# Patient Record
Sex: Male | Born: 1935 | Race: White | Hispanic: No | Marital: Married | Smoking: Never smoker
Health system: Southern US, Community
[De-identification: ages and names within clinical notes are randomized; demographics above are authoritative.]

## PROBLEM LIST (undated history)

## (undated) DIAGNOSIS — K219 Gastro-esophageal reflux disease without esophagitis: Secondary | ICD-10-CM

## (undated) DIAGNOSIS — E781 Pure hyperglyceridemia: Secondary | ICD-10-CM

## (undated) DIAGNOSIS — R011 Cardiac murmur, unspecified: Secondary | ICD-10-CM

## (undated) DIAGNOSIS — N529 Male erectile dysfunction, unspecified: Secondary | ICD-10-CM

## (undated) DIAGNOSIS — C801 Malignant (primary) neoplasm, unspecified: Secondary | ICD-10-CM

## (undated) DIAGNOSIS — I10 Essential (primary) hypertension: Secondary | ICD-10-CM

## (undated) DIAGNOSIS — D649 Anemia, unspecified: Secondary | ICD-10-CM

## (undated) DIAGNOSIS — N183 Chronic kidney disease, stage 3 unspecified: Secondary | ICD-10-CM

## (undated) DIAGNOSIS — M1990 Unspecified osteoarthritis, unspecified site: Secondary | ICD-10-CM

## (undated) DIAGNOSIS — H9190 Unspecified hearing loss, unspecified ear: Secondary | ICD-10-CM

## (undated) DIAGNOSIS — L719 Rosacea, unspecified: Secondary | ICD-10-CM

## (undated) DIAGNOSIS — R0600 Dyspnea, unspecified: Secondary | ICD-10-CM

## (undated) DIAGNOSIS — N19 Unspecified kidney failure: Secondary | ICD-10-CM

## (undated) HISTORY — DX: Pure hyperglyceridemia: E78.1

## (undated) HISTORY — DX: Gastro-esophageal reflux disease without esophagitis: K21.9

## (undated) HISTORY — DX: Male erectile dysfunction, unspecified: N52.9

## (undated) HISTORY — PX: OTHER SURGICAL HISTORY: SHX169

## (undated) HISTORY — DX: Cardiac murmur, unspecified: R01.1

## (undated) HISTORY — DX: Rosacea, unspecified: L71.9

## (undated) HISTORY — PX: COLONOSCOPY: SHX174

## (undated) HISTORY — DX: Essential (primary) hypertension: I10

## (undated) HISTORY — PX: TONSILLECTOMY: SUR1361

## (undated) HISTORY — DX: Chronic kidney disease, stage 3 (moderate): N18.3

## (undated) HISTORY — DX: Chronic kidney disease, stage 3 unspecified: N18.30

---

## 2011-05-19 ENCOUNTER — Telehealth: Payer: Self-pay | Admitting: Oncology

## 2011-05-19 NOTE — Telephone Encounter (Signed)
Pt returned my call and was given newpt appt w/FS for 1/31 @ 10:30 am.

## 2011-05-19 NOTE — Telephone Encounter (Signed)
lmonvm for pt re calling me for appt w/FS.

## 2011-05-24 ENCOUNTER — Telehealth: Payer: Self-pay | Admitting: Oncology

## 2011-05-24 ENCOUNTER — Other Ambulatory Visit: Payer: Self-pay | Admitting: Oncology

## 2011-05-24 DIAGNOSIS — D729 Disorder of white blood cells, unspecified: Secondary | ICD-10-CM

## 2011-05-24 NOTE — Telephone Encounter (Signed)
Referred by Dr. Donato Heinz  [facility]

## 2011-05-27 ENCOUNTER — Ambulatory Visit (HOSPITAL_BASED_OUTPATIENT_CLINIC_OR_DEPARTMENT_OTHER): Payer: Medicare Other | Admitting: Oncology

## 2011-05-27 ENCOUNTER — Ambulatory Visit (HOSPITAL_BASED_OUTPATIENT_CLINIC_OR_DEPARTMENT_OTHER): Payer: Medicare Other

## 2011-05-27 ENCOUNTER — Telehealth: Payer: Self-pay | Admitting: Oncology

## 2011-05-27 ENCOUNTER — Other Ambulatory Visit (HOSPITAL_BASED_OUTPATIENT_CLINIC_OR_DEPARTMENT_OTHER): Payer: Medicare Other

## 2011-05-27 ENCOUNTER — Ambulatory Visit: Payer: Medicare Other

## 2011-05-27 VITALS — BP 145/65 | HR 68 | Temp 97.7°F | Ht 65.5 in | Wt 150.5 lb

## 2011-05-27 DIAGNOSIS — D7289 Other specified disorders of white blood cells: Secondary | ICD-10-CM

## 2011-05-27 DIAGNOSIS — D729 Disorder of white blood cells, unspecified: Secondary | ICD-10-CM

## 2011-05-27 LAB — CBC WITH DIFFERENTIAL/PLATELET
Eosinophils Absolute: 0.1 10*3/uL (ref 0.0–0.5)
HCT: 37 % — ABNORMAL LOW (ref 38.4–49.9)
HGB: 12.5 g/dL — ABNORMAL LOW (ref 13.0–17.1)
LYMPH%: 25.3 % (ref 14.0–49.0)
MONO#: 0.7 10*3/uL (ref 0.1–0.9)
NEUT#: 4.8 10*3/uL (ref 1.5–6.5)
NEUT%: 63.9 % (ref 39.0–75.0)
Platelets: 224 10*3/uL (ref 140–400)
WBC: 7.4 10*3/uL (ref 4.0–10.3)

## 2011-05-27 NOTE — Telephone Encounter (Signed)
gv pt appt for july-aug2013.  sent pt to labs. scheduled pt for bone survey for 06/03/2011 @ [facility]

## 2011-05-27 NOTE — Progress Notes (Signed)
CC:   Donato Heinz, M.D. Gavin Pound, MD  REFERRING PHYSICIAN:  Donato Heinz, M.D.  REASON FOR CONSULTATION:  Evaluation for plasma cell disorder.  HISTORY OF PRESENT ILLNESS:  Mr. Jeffrey Payne is a pleasant gentleman of Caucasian descent, native of [place] but lived in multiple areas and has retired to [place] for many years now.  He relocated to [facility] back in August of 2012 to live closer to his daughter.  He is a gentleman with a history of a longstanding hypertension and chronic renal sufficiency presumably related to that.  His creatinine have ranged around 1.4 and he was followed by Nephrology while he was living in [place].  However, he developed worsening renal insufficiency. December's creatinine was 1.72 and he was evaluated by a Dr. Marval Regal and his workup including renal artery duplex did not show any evidence of renal artery stenosis.  He had also had a serum protein electrophoresis which showed an M-spike of around 0.4 g/dL.  He had a negative ANCA panel.  His quantitative immunoglobulins showed an elevated IgM of 1130.  He had a suppressed IgG and IgA levels.  He had serum light chains that showed an elevated kappa to lambda ratio of 5.26.  His urine electrophoresis was negative, did not show any monoclonal protein.  His hemoglobin was normal of 13.2, white count of 6.1, normal platelets, normal differential.  His creatinine was about 1.47 with a creatinine clearance of around 46.  Clinically, Mr. Marotte is asymptomatic.  He did not report any back pain.  He does not report any shoulder pain.  He does not report any weight loss.  He has some slight fatigue reported but really no constitutional symptoms.  No lymphadenopathy.  He does not report any thrombotic events.  He has not had any bleeding diathesis.  REVIEW OF SYSTEMS:  He does not report any headaches, blurry vision, double vision. He does not report any motor or sensory neuropathy.  He did not  report any alteration of mental status.  He does not report any psychiatric issues, depression.  He does not report any fever, chills, sweats.  He does not report any cough, hemoptysis, hematemesis.  No nausea, vomiting, abdominal pain.  No hematochezia, melena, or genitourinary complaints.  Rest of review of systems unremarkable.  PAST MEDICAL HISTORY:  Significant for hypertension, osteoarthritis, GERD, history of prostate cancer although that history is questionable, history of rosacea as well as chronic renal insufficiency.  He is status post tonsillectomy, hernia repair and rotator cuff repair.  MEDICATION:  He is on lisinopril, hydrochlorothiazide, terazosin, Toprol, Norvasc, ranitidine, meloxicam, aspirin, Claritin, Flonase, glucosamine and calcium supplement and fish oil.  ALLERGIES:  None.  SOCIAL HISTORY:  He is married, has 3 children.  He denied any alcohol abuse.  He drinks a Manhattan every once in a while.  He worked as an Chief Financial Officer.  FAMILY HISTORY:  His father died in his late 6s.  Mother had breast cancer.  Otherwise no history of any malignant disorders or blood problems.  PHYSICAL EXAMINATION:  General:  Alert, awake gentleman, appeared in no active distress today.  Vital signs:  His blood pressure is 145/65, pulse is 68, respiration 20, he is afebrile at 97.7.  ECOG performance status is 1.  HEENT:  Head is normocephalic, atraumatic.  Pupils equal, round, reactive to light.  Oral mucosa moist and pink.  Neck:  Supple without lymphadenopathy.  Heart:  Regular rate and rhythm.  S1, S2. Lungs:  Clear to auscultation.  Abdomen:  Soft, nontender.  No hepatosplenomegaly.  Extremities:  No clubbing, cyanosis, or edema. Neurological:  Intact motor, sensory and deep tendon reflexes.  LABORATORY DATA:  As discussed in the history of present illness.  My workup is currently pending.  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  He has an elevated IgM quantitative immunoglobulin of 1100, upper limit of normal is 230.  Immunofixation showed an IgM monoclonal protein with kappa light chains specificity.  I had a discussion today with Mr. Leiba and his wife discussing the differential diagnosis at this time.  Differential diagnosis include but not limited to a certain plasma cell disorders that include a monoclonal gammopathy of undetermined significance, multiple myeloma, Waldenstrom's macroglobulinemia can certainly manifest with an IgM elevation.  Also a reactive process can manifest with IgM monoclonal gammopathy is also known and has been described in the literature.  Autoimmune disorders certainly can manifest itself with an IgM monoclonal protein and definitely a lymphoproliferative disorder such as indolent lymphomas are certainly possibilities.  In terms of workup, at this point I believe that his spike is rather small.  He is asymptomatic.  I will repeat the serum protein electrophoresis and a quantitative immunoglobulin just to get an idea of the pace of this disease.  I will also obtain a skeletal survey to rule out lytic lesion although an IgM multiple myeloma is rather rare.  I do not really see any evidence to suggest Waldenstrom's macroglobulinemia.  I certainly do not see any evidence to suggest a lymphoproliferative disorder.  However, if he develops any symptomatology in the future, progressive cytopenias, constitutional symptoms, then I will obtain a bone marrow biopsy and CT scans.  At this time, I think continued observation is reasonable and I will see him back in 6 months' time, repeat CT studies again pending initial workup being negative.  I do not think his renal insufficiency is related to his M-spike.  His urine has really cleared, he did not have any light chains so I think this is an unrelated issue.  However, I  will continue to monitor him and we will work him up as needed.    ______________________________ Wyatt Portela, M.D. FNS/MEDQ  D:  05/27/2011  T:  05/27/2011  Job:  [id number]

## 2011-05-27 NOTE — Progress Notes (Signed)
Note dictated

## 2011-05-31 LAB — SPEP & IFE WITH QIG
Alpha-2-Globulin: 13.3 % — ABNORMAL HIGH (ref 7.1–11.8)
Beta 2: 7 % — ABNORMAL HIGH (ref 3.2–6.5)
Gamma Globulin: 10.7 % — ABNORMAL LOW (ref 11.1–18.8)
IgG (Immunoglobin G), Serum: 374 mg/dL — ABNORMAL LOW (ref 650–1600)
IgM, Serum: 885 mg/dL — ABNORMAL HIGH (ref 41–251)
M-Spike, %: 0.37 g/dL

## 2011-05-31 LAB — COMPREHENSIVE METABOLIC PANEL
ALT: 16 U/L (ref 0–53)
Albumin: 4.4 g/dL (ref 3.5–5.2)
BUN: 37 mg/dL — ABNORMAL HIGH (ref 6–23)
CO2: 25 mEq/L (ref 19–32)
Calcium: 10.2 mg/dL (ref 8.4–10.5)
Chloride: 104 mEq/L (ref 96–112)
Creatinine, Ser: 1.73 mg/dL — ABNORMAL HIGH (ref 0.50–1.35)
Potassium: 4.2 mEq/L (ref 3.5–5.3)

## 2011-05-31 LAB — KAPPA/LAMBDA LIGHT CHAINS: Kappa free light chain: 2.45 mg/dL — ABNORMAL HIGH (ref 0.33–1.94)

## 2011-06-03 ENCOUNTER — Ambulatory Visit (HOSPITAL_COMMUNITY)
Admission: RE | Admit: 2011-06-03 | Discharge: 2011-06-03 | Disposition: A | Discharge status: Home / Self Care | Payer: Medicare Other | Source: Ambulatory Visit | Attending: Oncology | Admitting: Oncology

## 2011-06-03 DIAGNOSIS — M25519 Pain in unspecified shoulder: Secondary | ICD-10-CM | POA: Insufficient documentation

## 2011-06-03 DIAGNOSIS — M47817 Spondylosis without myelopathy or radiculopathy, lumbosacral region: Secondary | ICD-10-CM | POA: Insufficient documentation

## 2011-06-03 DIAGNOSIS — M25539 Pain in unspecified wrist: Secondary | ICD-10-CM | POA: Insufficient documentation

## 2011-06-03 DIAGNOSIS — J8410 Pulmonary fibrosis, unspecified: Secondary | ICD-10-CM | POA: Insufficient documentation

## 2011-06-03 DIAGNOSIS — M129 Arthropathy, unspecified: Secondary | ICD-10-CM | POA: Insufficient documentation

## 2011-06-03 DIAGNOSIS — D729 Disorder of white blood cells, unspecified: Secondary | ICD-10-CM

## 2011-06-03 DIAGNOSIS — M4120 Other idiopathic scoliosis, site unspecified: Secondary | ICD-10-CM | POA: Insufficient documentation

## 2011-06-03 DIAGNOSIS — D7289 Other specified disorders of white blood cells: Secondary | ICD-10-CM | POA: Insufficient documentation

## 2011-07-09 ENCOUNTER — Encounter: Payer: Self-pay | Admitting: Oncology

## 2011-11-23 ENCOUNTER — Other Ambulatory Visit (HOSPITAL_BASED_OUTPATIENT_CLINIC_OR_DEPARTMENT_OTHER): Payer: Medicare Other

## 2011-11-23 DIAGNOSIS — D729 Disorder of white blood cells, unspecified: Secondary | ICD-10-CM

## 2011-11-23 DIAGNOSIS — D7289 Other specified disorders of white blood cells: Secondary | ICD-10-CM

## 2011-11-23 LAB — CBC WITH DIFFERENTIAL/PLATELET
Eosinophils Absolute: 0.1 10*3/uL (ref 0.0–0.5)
HCT: 36.9 % — ABNORMAL LOW (ref 38.4–49.9)
LYMPH%: 27.4 % (ref 14.0–49.0)
MONO#: 0.9 10*3/uL (ref 0.1–0.9)
NEUT#: 3.9 10*3/uL (ref 1.5–6.5)
NEUT%: 58.3 % (ref 39.0–75.0)
Platelets: 204 10*3/uL (ref 140–400)
RBC: 3.91 10*6/uL — ABNORMAL LOW (ref 4.20–5.82)
WBC: 6.8 10*3/uL (ref 4.0–10.3)
lymph#: 1.9 10*3/uL (ref 0.9–3.3)

## 2011-11-25 LAB — COMPREHENSIVE METABOLIC PANEL
ALT: 16 U/L (ref 0–53)
CO2: 24 mEq/L (ref 19–32)
Calcium: 10.2 mg/dL (ref 8.4–10.5)
Chloride: 107 mEq/L (ref 96–112)
Glucose, Bld: 92 mg/dL (ref 70–99)
Sodium: 141 mEq/L (ref 135–145)
Total Bilirubin: 0.6 mg/dL (ref 0.3–1.2)
Total Protein: 6 g/dL (ref 6.0–8.3)

## 2011-11-25 LAB — SPEP & IFE WITH QIG
Alpha-2-Globulin: 12.8 % — ABNORMAL HIGH (ref 7.1–11.8)
Beta 2: 6.3 % (ref 3.2–6.5)
Beta Globulin: 5 % (ref 4.7–7.2)
Gamma Globulin: 10.8 % — ABNORMAL LOW (ref 11.1–18.8)
IgA: 55 mg/dL — ABNORMAL LOW (ref 68–379)
IgG (Immunoglobin G), Serum: 327 mg/dL — ABNORMAL LOW (ref 650–1600)
M-Spike, %: 0.16 g/dL
Total Protein, Serum Electrophoresis: 6 g/dL (ref 6.0–8.3)

## 2011-11-25 LAB — KAPPA/LAMBDA LIGHT CHAINS: Lambda Free Lght Chn: 0.75 mg/dL (ref 0.57–2.63)

## 2011-12-07 ENCOUNTER — Telehealth: Payer: Self-pay | Admitting: Oncology

## 2011-12-07 ENCOUNTER — Ambulatory Visit (HOSPITAL_BASED_OUTPATIENT_CLINIC_OR_DEPARTMENT_OTHER): Payer: Medicare Other | Admitting: Oncology

## 2011-12-07 VITALS — BP 126/68 | HR 65 | Temp 97.6°F | Resp 20 | Ht 65.5 in | Wt 148.8 lb

## 2011-12-07 DIAGNOSIS — D729 Disorder of white blood cells, unspecified: Secondary | ICD-10-CM

## 2011-12-07 DIAGNOSIS — D472 Monoclonal gammopathy: Secondary | ICD-10-CM

## 2011-12-07 NOTE — Progress Notes (Signed)
Hematology and Oncology Follow Up Visit  Jeffrey Payne [id number] May 28, 1932 78 y.o. 12/07/2011 10:11 AM   Principle Diagnosis: 78 year old gentleman with a monoclonal protein, it is an IgM subtype, presented with an M-spike about 0.4 g/dL. He has an elevated IgM quantitative immunoglobulin is around 1100 on 04/2011.  Differential diagnosis include plasma cell disorders like  monoclonal gammopathy of undetermined significance,  (less likely multiple myeloma),  lymphoproliferative disorder like Waldenstrom's macroglobulinemia and reactive process can manifest with IgM monoclonal gammopathy as well.  His Skeletal survey is normal from 05/2011.   Current therapy: Observation and surveillance.   Interim History:  Jeffrey Payne presents today for a follow up visit. Since his last visit he has been doing well with no complaints. He reports mild back pain at this time which has not changed. Clinically, Jeffrey Payne is asymptomatic. He did not report any back pain. He does not report any shoulder pain. He does not report any weight loss. He has some slight fatigue reported but really no constitutional symptoms. No lymphadenopathy. He does not report any thrombotic events. He has not  had any bleeding diathesis.    Medications: I have reviewed the patient's current medications. Current outpatient prescriptions:acetaminophen (TYLENOL) 650 MG CR tablet, Take 650 mg by mouth every 8 (eight) hours as needed., Disp: , Rfl: ;  amLODipine (NORVASC) 10 MG tablet, Take 10 mg by mouth daily., Disp: , Rfl: ;  aspirin 81 MG tablet, Take 81 mg by mouth daily., Disp: , Rfl: ;  calcium gluconate 500 MG tablet, Take 500 mg by mouth daily. Plus vitamin D 2, Disp: , Rfl:  doxycycline (VIBRAMYCIN) 100 MG capsule, Take 100 mg by mouth daily. For rosacea, Disp: , Rfl: ;  fish oil-omega-3 fatty acids 1000 MG capsule, Take 1,000 mg by mouth 3 (three) times daily., Disp: , Rfl: ;  fluticasone (FLONASE) 50 MCG/ACT nasal spray, Place 2  sprays into the nose daily., Disp: , Rfl: ;  furosemide (LASIX) 40 MG tablet, Take 40 mg by mouth daily., Disp: , Rfl:  Glucosamine-Chondroitin (GLUCOSAMINE CHONDR COMPLEX PO), Take 750 mg by mouth 2 (two) times daily., Disp: , Rfl: ;  lisinopril (PRINIVIL,ZESTRIL) 40 MG tablet, Take 40 mg by mouth daily., Disp: , Rfl: ;  loratadine (CLARITIN) 10 MG tablet, Take 10 mg by mouth daily. As needed, Disp: , Rfl: ;  metoprolol succinate (TOPROL-XL) 25 MG 24 hr tablet, Take 25 mg by mouth daily., Disp: , Rfl:  multivitamin (ONE-A-DAY MEN'S) TABS, Take 1 tablet by mouth daily., Disp: , Rfl: ;  ranitidine (ZANTAC) 150 MG tablet, Take 150 mg by mouth 2 (two) times daily., Disp: , Rfl: ;  terazosin (HYTRIN) 10 MG capsule, Take 10 mg by mouth at bedtime., Disp: , Rfl:   Allergies: No Known Allergies  Past Medical History, Surgical history, Social history, and Family History were reviewed and updated.  Review of Systems: Constitutional:  Negative for fever, chills, night sweats, anorexia, weight loss, pain. Cardiovascular: no chest pain or dyspnea on exertion Respiratory: negative Neurological: negative Dermatological: negative ENT: negative Skin: Negative. Gastrointestinal: negative Genito-Urinary: negative Hematological and Lymphatic: negative Breast: negative Musculoskeletal: negative Remaining ROS negative. Physical Exam: Blood pressure 126/68, pulse 65, temperature 97.6 F (36.4 C), temperature source Oral, resp. rate 20, height 5' 5.5" (1.664 m), weight 148 lb 12.8 oz (67.495 kg). ECOG: 1 General appearance: alert Head: Normocephalic, without obvious abnormality, atraumatic Neck: no adenopathy, no carotid bruit, no JVD, supple, symmetrical, trachea midline and thyroid not enlarged, symmetric, no  tenderness/mass/nodules Lymph nodes: Cervical, supraclavicular, and axillary nodes normal. Heart:regular rate and rhythm, S1, S2 normal, no murmur, click, rub or gallop Lung:chest clear, no wheezing,  rales, normal symmetric air entry Abdomin: soft, non-tender, without masses or organomegaly EXT:no erythema, induration, or nodules   Lab Results: Lab Results  Component Value Date   WBC 6.8 11/23/2011   HGB 12.3* 11/23/2011   HCT 36.9* 11/23/2011   MCV 94.4 11/23/2011   PLT 204 11/23/2011     Chemistry      Component Value Date/Time   NA 141 11/23/2011 1009   K 4.4 11/23/2011 1009   CL 107 11/23/2011 1009   CO2 24 11/23/2011 1009   BUN 37* 11/23/2011 1009   CREATININE 1.81* 11/23/2011 1009      Component Value Date/Time   CALCIUM 10.2 11/23/2011 1009   ALKPHOS 72 11/23/2011 1009   AST 22 11/23/2011 1009   ALT 16 11/23/2011 1009   BILITOT 0.6 11/23/2011 1009     Results for Jeffrey Payne (MRN [id number]) as of 12/07/2011 10:18  Ref. Range 05/27/2011 12:27 06/03/2011 13:17 11/23/2011 10:09  M-SPIKE, % No range found 0.37  0.16   Results for Jeffrey Payne (MRN [id number]) as of 12/07/2011 10:18  Ref. Range 05/04/2011 16:17 05/07/2011 11:20 05/27/2011 12:27 06/03/2011 13:17 11/23/2011 10:09  IgM, Serum Latest Range: 41-251 mg/dL   885 (H)  818 (H)   Impression and Plan:  78 year old gentleman with a monoclonal protein, it is an IgM subtype, presented with an M-spike about 0.4 g/dL. He has an elevated IgM quantitative immunoglobulin is around 1100 on 04/2011.  Differential diagnosis include plasma cell disorders like  monoclonal gammopathy of undetermined significance,  (less likely multiple myeloma),  lymphoproliferative disorder like Waldenstrom's macroglobulinemia and reactive process can manifest with IgM monoclonal gammopathy as well.  His Skeletal survey is normal from 05/2011.  His repeat protein studies on 11/23/2011 showed that his M spike is down to 0.16, his IgM is down to 818.  For now, I will continue to follow him every 6 months. I will restage him with CT scan or bone marrow biopsy if I notice any changes in his protein studies.   [facility], MD 8/13/201310:11 AM

## 2011-12-07 NOTE — Telephone Encounter (Signed)
gv pt appt schedule for February 2014.

## 2012-06-01 ENCOUNTER — Other Ambulatory Visit (HOSPITAL_BASED_OUTPATIENT_CLINIC_OR_DEPARTMENT_OTHER): Payer: Medicare Other | Admitting: Lab

## 2012-06-01 DIAGNOSIS — D729 Disorder of white blood cells, unspecified: Secondary | ICD-10-CM

## 2012-06-01 DIAGNOSIS — D7289 Other specified disorders of white blood cells: Secondary | ICD-10-CM

## 2012-06-01 LAB — COMPREHENSIVE METABOLIC PANEL (CC13)
ALT: 19 U/L (ref 0–55)
AST: 24 U/L (ref 5–34)
Alkaline Phosphatase: 85 U/L (ref 40–150)
BUN: 39.4 mg/dL — ABNORMAL HIGH (ref 7.0–26.0)
Chloride: 107 mEq/L (ref 98–107)
Creatinine: 1.7 mg/dL — ABNORMAL HIGH (ref 0.7–1.3)
Total Bilirubin: 0.55 mg/dL (ref 0.20–1.20)

## 2012-06-01 LAB — CBC WITH DIFFERENTIAL/PLATELET
Basophils Absolute: 0 10*3/uL (ref 0.0–0.1)
Eosinophils Absolute: 0.1 10*3/uL (ref 0.0–0.5)
HGB: 12.2 g/dL — ABNORMAL LOW (ref 13.0–17.1)
LYMPH%: 24.9 % (ref 14.0–49.0)
MCV: 93.8 fL (ref 79.3–98.0)
MONO#: 0.8 10*3/uL (ref 0.1–0.9)
MONO%: 11.3 % (ref 0.0–14.0)
NEUT#: 4.5 10*3/uL (ref 1.5–6.5)
Platelets: 198 10*3/uL (ref 140–400)
RDW: 13.9 % (ref 11.0–14.6)
WBC: 7.3 10*3/uL (ref 4.0–10.3)

## 2012-06-05 LAB — SPEP & IFE WITH QIG
Albumin ELP: 59.9 % (ref 55.8–66.1)
Beta 2: 6.5 % (ref 3.2–6.5)
Beta Globulin: 5.4 % (ref 4.7–7.2)
IgA: 61 mg/dL — ABNORMAL LOW (ref 68–379)
IgG (Immunoglobin G), Serum: 399 mg/dL — ABNORMAL LOW (ref 650–1600)
IgM, Serum: 813 mg/dL — ABNORMAL HIGH (ref 41–251)
M-Spike, %: 0.16 g/dL

## 2012-06-05 LAB — KAPPA/LAMBDA LIGHT CHAINS
Kappa free light chain: 2.43 mg/dL — ABNORMAL HIGH (ref 0.33–1.94)
Lambda Free Lght Chn: 0.81 mg/dL (ref 0.57–2.63)

## 2012-06-08 ENCOUNTER — Ambulatory Visit: Payer: Medicare Other | Admitting: Oncology

## 2012-06-08 ENCOUNTER — Other Ambulatory Visit: Payer: Medicare Other | Admitting: Lab

## 2012-06-09 ENCOUNTER — Telehealth: Payer: Self-pay | Admitting: Oncology

## 2012-06-09 NOTE — Telephone Encounter (Signed)
returned pt call and r/s missed appts....pt ok and aware

## 2012-07-24 ENCOUNTER — Other Ambulatory Visit: Payer: Self-pay | Admitting: Oncology

## 2012-07-24 DIAGNOSIS — D729 Disorder of white blood cells, unspecified: Secondary | ICD-10-CM

## 2012-07-25 ENCOUNTER — Other Ambulatory Visit (HOSPITAL_BASED_OUTPATIENT_CLINIC_OR_DEPARTMENT_OTHER): Payer: Medicare Other

## 2012-07-25 ENCOUNTER — Telehealth: Payer: Self-pay | Admitting: Oncology

## 2012-07-25 ENCOUNTER — Ambulatory Visit (HOSPITAL_BASED_OUTPATIENT_CLINIC_OR_DEPARTMENT_OTHER): Payer: Medicare Other | Admitting: Oncology

## 2012-07-25 VITALS — BP 130/60 | HR 59 | Temp 97.5°F | Resp 18 | Ht 65.5 in | Wt 148.4 lb

## 2012-07-25 DIAGNOSIS — D729 Disorder of white blood cells, unspecified: Secondary | ICD-10-CM

## 2012-07-25 DIAGNOSIS — R894 Abnormal immunological findings in specimens from other organs, systems and tissues: Secondary | ICD-10-CM

## 2012-07-25 LAB — CBC WITH DIFFERENTIAL/PLATELET
Basophils Absolute: 0 10*3/uL (ref 0.0–0.1)
EOS%: 1 % (ref 0.0–7.0)
Eosinophils Absolute: 0.1 10*3/uL (ref 0.0–0.5)
HGB: 12.1 g/dL — ABNORMAL LOW (ref 13.0–17.1)
MCH: 30.3 pg (ref 27.2–33.4)
NEUT#: 5.1 10*3/uL (ref 1.5–6.5)
RDW: 13.5 % (ref 11.0–14.6)
lymph#: 1.7 10*3/uL (ref 0.9–3.3)

## 2012-07-25 LAB — COMPREHENSIVE METABOLIC PANEL (CC13)
AST: 17 U/L (ref 5–34)
Albumin: 3.2 g/dL — ABNORMAL LOW (ref 3.5–5.0)
BUN: 49.1 mg/dL — ABNORMAL HIGH (ref 7.0–26.0)
Calcium: 9.5 mg/dL (ref 8.4–10.4)
Chloride: 106 mEq/L (ref 98–107)
Potassium: 4.3 mEq/L (ref 3.5–5.1)
Total Protein: 6.4 g/dL (ref 6.4–8.3)

## 2012-07-25 NOTE — Telephone Encounter (Signed)
Gave pt appt for lab and MD April 2014

## 2012-07-25 NOTE — Progress Notes (Signed)
Hematology and Oncology Follow Up Visit  Winner Courtwright [id number] 05-03-1932 79 y.o. 07/25/2012 1:26 PM   Principle Diagnosis: 79 year old gentleman with a monoclonal protein, it is an IgM subtype, presented with an M-spike about 0.4 g/dL. He has an elevated IgM quantitative immunoglobulin is around 1100 on 04/2011.  Differential diagnosis include plasma cell disorders like  monoclonal gammopathy of undetermined significance,  (less likely multiple myeloma),  lymphoproliferative disorder like Waldenstrom's macroglobulinemia and reactive process can manifest with IgM monoclonal gammopathy as well.  His Skeletal survey is normal from 05/2011.   Current therapy: Observation and surveillance.   Interim History:  Mr. Sanzone presents today for a follow up visit. Since his last visit he has been doing well with no complaints. Clinically, Mr. Swagger is asymptomatic. He did not report any back pain. He does not report any shoulder pain. He does not report any weight loss. He has some slight fatigue reported but really no constitutional symptoms. No lymphadenopathy. He does not report any thrombotic events. He has not had any bleeding diathesis. He reported no hospitalizations or illnesses.    Medications: I have reviewed the patient's current medications. Current outpatient prescriptions:acetaminophen (TYLENOL) 650 MG CR tablet, Take 650 mg by mouth every 8 (eight) hours as needed., Disp: , Rfl: ;  amLODipine (NORVASC) 10 MG tablet, Take 10 mg by mouth daily., Disp: , Rfl: ;  aspirin 81 MG tablet, Take 81 mg by mouth daily., Disp: , Rfl: ;  calcium gluconate 500 MG tablet, Take 500 mg by mouth daily. Plus vitamin D 2, Disp: , Rfl:  doxycycline (VIBRAMYCIN) 100 MG capsule, Take 100 mg by mouth daily. For rosacea, Disp: , Rfl: ;  fish oil-omega-3 fatty acids 1000 MG capsule, Take 1,000 mg by mouth 3 (three) times daily., Disp: , Rfl: ;  fluticasone (FLONASE) 50 MCG/ACT nasal spray, Place 2 sprays into the nose  daily., Disp: , Rfl: ;  furosemide (LASIX) 40 MG tablet, Take 40 mg by mouth daily., Disp: , Rfl:  Glucosamine-Chondroitin (GLUCOSAMINE CHONDR COMPLEX PO), Take 750 mg by mouth 2 (two) times daily., Disp: , Rfl: ;  lisinopril (PRINIVIL,ZESTRIL) 40 MG tablet, Take 40 mg by mouth daily., Disp: , Rfl: ;  loratadine (CLARITIN) 10 MG tablet, Take 10 mg by mouth daily. As needed, Disp: , Rfl: ;  metoprolol succinate (TOPROL-XL) 25 MG 24 hr tablet, Take 25 mg by mouth daily., Disp: , Rfl:  multivitamin (ONE-A-DAY MEN'S) TABS, Take 1 tablet by mouth daily., Disp: , Rfl: ;  ranitidine (ZANTAC) 150 MG tablet, Take 150 mg by mouth 2 (two) times daily., Disp: , Rfl: ;  terazosin (HYTRIN) 10 MG capsule, Take 10 mg by mouth at bedtime., Disp: , Rfl:   Allergies: No Known Allergies  Past Medical History, Surgical history, Social history, and Family History were reviewed and updated.  Review of Systems: Constitutional:  Negative for fever, chills, night sweats, anorexia, weight loss, pain. Cardiovascular: no chest pain or dyspnea on exertion Respiratory: negative Neurological: negative Dermatological: negative ENT: negative Skin: Negative. Gastrointestinal: negative Genito-Urinary: negative Hematological and Lymphatic: negative Breast: negative Musculoskeletal: negative Remaining ROS negative. Physical Exam: Blood pressure 130/60, pulse 59, temperature 97.5 F (36.4 C), temperature source Oral, resp. rate 18, height 5' 5.5" (1.664 m), weight 148 lb 6.4 oz (67.314 kg). ECOG: 1 General appearance: alert Head: Normocephalic, without obvious abnormality, atraumatic Neck: no adenopathy, no carotid bruit, no JVD, supple, symmetrical, trachea midline and thyroid not enlarged, symmetric, no tenderness/mass/nodules Lymph nodes: Cervical, supraclavicular, and axillary  nodes normal. Heart:regular rate and rhythm, S1, S2 normal, no murmur, click, rub or gallop Lung:chest clear, no wheezing, rales, normal  symmetric air entry Abdomin: soft, non-tender, without masses or organomegaly EXT:no erythema, induration, or nodules   Lab Results: Lab Results  Component Value Date   WBC 7.7 07/25/2012   HGB 12.1* 07/25/2012   HCT 37.1* 07/25/2012   MCV 93.0 07/25/2012   PLT 203 07/25/2012     Chemistry      Component Value Date/Time   NA 138 06/01/2012 1329   NA 141 11/23/2011 1009   K 4.1 06/01/2012 1329   K 4.4 11/23/2011 1009   CL 107 06/01/2012 1329   CL 107 11/23/2011 1009   CO2 21* 06/01/2012 1329   CO2 24 11/23/2011 1009   BUN 39.4* 06/01/2012 1329   BUN 37* 11/23/2011 1009   CREATININE 1.7* 06/01/2012 1329   CREATININE 1.81* 11/23/2011 1009      Component Value Date/Time   CALCIUM 9.0 06/01/2012 1329   CALCIUM 10.2 11/23/2011 1009   ALKPHOS 85 06/01/2012 1329   ALKPHOS 72 11/23/2011 1009   AST 24 06/01/2012 1329   AST 22 11/23/2011 1009   ALT 19 06/01/2012 1329   ALT 16 11/23/2011 1009   BILITOT 0.55 06/01/2012 1329   BILITOT 0.6 11/23/2011 1009     Results for KHYLON, EBERLING (MRN [id number]) as of 07/25/2012 13:11  Ref. Range 11/23/2011 10:09 06/01/2012 13:29  M-SPIKE, % No range found 0.16 0.16  Results for GREISON, FOLINO (MRN [id number]) as of 07/25/2012 13:11  Ref. Range 11/23/2011 10:09 06/01/2012 13:29  IgM, Serum Latest Range: 41-251 mg/dL 818 (H) 813 (H)    Impression and Plan:  79 year old gentleman with a monoclonal protein, it is an IgM subtype, presented with an M-spike less than 0.4 g/dL (now is 0.16). He has an elevated IgM quantitative immunoglobulin is around 800s. Differential diagnosis include plasma cell disorders like  monoclonal gammopathy of undetermined significance,  (less likely multiple myeloma),  lymphoproliferative disorder like Waldenstrom's macroglobulinemia and reactive process can manifest with IgM monoclonal gammopathy as well.  His Skeletal survey is normal from 05/2011.  His repeat protein studies on 05/2012 showed that his M spike is down to 0.16, his IgM is down to 813.  For now, I  will continue to follow him every 12 months. I will restage him with CT scan or bone marrow biopsy if I notice any changes in his protein studies.   Zola Button, MD 4/1/20141:26 PM

## 2012-07-25 NOTE — Progress Notes (Signed)
Mailed updated medication list and AVS to patient's home.

## 2012-07-25 NOTE — Addendum Note (Signed)
Addended by: Randolm Idol on: 07/25/2012 02:22 PM   Modules accepted: Orders

## 2012-07-27 LAB — SPEP & IFE WITH QIG
Alpha-1-Globulin: 4.6 % (ref 2.9–4.9)
Alpha-2-Globulin: 12.3 % — ABNORMAL HIGH (ref 7.1–11.8)
Beta 2: 6.9 % — ABNORMAL HIGH (ref 3.2–6.5)
Beta Globulin: 5.3 % (ref 4.7–7.2)
Gamma Globulin: 10.6 % — ABNORMAL LOW (ref 11.1–18.8)
IgM, Serum: 871 mg/dL — ABNORMAL HIGH (ref 41–251)

## 2013-07-19 ENCOUNTER — Other Ambulatory Visit (HOSPITAL_BASED_OUTPATIENT_CLINIC_OR_DEPARTMENT_OTHER): Payer: Medicare Other

## 2013-07-19 DIAGNOSIS — D729 Disorder of white blood cells, unspecified: Secondary | ICD-10-CM

## 2013-07-19 DIAGNOSIS — R894 Abnormal immunological findings in specimens from other organs, systems and tissues: Secondary | ICD-10-CM

## 2013-07-19 LAB — COMPREHENSIVE METABOLIC PANEL (CC13)
ALT: 14 U/L (ref 0–55)
ANION GAP: 11 meq/L (ref 3–11)
AST: 18 U/L (ref 5–34)
Albumin: 3.4 g/dL — ABNORMAL LOW (ref 3.5–5.0)
Alkaline Phosphatase: 111 U/L (ref 40–150)
BUN: 39 mg/dL — AB (ref 7.0–26.0)
CO2: 22 meq/L (ref 22–29)
CREATININE: 1.7 mg/dL — AB (ref 0.7–1.3)
Calcium: 9.9 mg/dL (ref 8.4–10.4)
Chloride: 107 mEq/L (ref 98–109)
Glucose: 89 mg/dl (ref 70–140)
POTASSIUM: 4.9 meq/L (ref 3.5–5.1)
Sodium: 140 mEq/L (ref 136–145)
Total Bilirubin: 0.46 mg/dL (ref 0.20–1.20)
Total Protein: 6.7 g/dL (ref 6.4–8.3)

## 2013-07-19 LAB — CBC WITH DIFFERENTIAL/PLATELET
BASO%: 0.5 % (ref 0.0–2.0)
BASOS ABS: 0 10*3/uL (ref 0.0–0.1)
EOS ABS: 0.1 10*3/uL (ref 0.0–0.5)
EOS%: 1.7 % (ref 0.0–7.0)
HEMATOCRIT: 40.3 % (ref 38.4–49.9)
HEMOGLOBIN: 13.1 g/dL (ref 13.0–17.1)
LYMPH#: 1.6 10*3/uL (ref 0.9–3.3)
LYMPH%: 21.9 % (ref 14.0–49.0)
MCH: 30.4 pg (ref 27.2–33.4)
MCHC: 32.6 g/dL (ref 32.0–36.0)
MCV: 93.2 fL (ref 79.3–98.0)
MONO#: 1 10*3/uL — AB (ref 0.1–0.9)
MONO%: 13.4 % (ref 0.0–14.0)
NEUT%: 62.5 % (ref 39.0–75.0)
NEUTROS ABS: 4.7 10*3/uL (ref 1.5–6.5)
Platelets: 194 10*3/uL (ref 140–400)
RBC: 4.32 10*6/uL (ref 4.20–5.82)
RDW: 14.2 % (ref 11.0–14.6)
WBC: 7.4 10*3/uL (ref 4.0–10.3)

## 2013-07-23 LAB — SPEP & IFE WITH QIG
ALBUMIN ELP: 56.4 % (ref 55.8–66.1)
ALPHA-1-GLOBULIN: 5.4 % — AB (ref 2.9–4.9)
Alpha-2-Globulin: 14.3 % — ABNORMAL HIGH (ref 7.1–11.8)
BETA 2: 9.5 % — AB (ref 3.2–6.5)
BETA GLOBULIN: 5.2 % (ref 4.7–7.2)
GAMMA GLOBULIN: 9.2 % — AB (ref 11.1–18.8)
IGA: 65 mg/dL — AB (ref 68–379)
IGM, SERUM: 1000 mg/dL — AB (ref 41–251)
IgG (Immunoglobin G), Serum: 315 mg/dL — ABNORMAL LOW (ref 650–1600)
M-Spike, %: 0.17 g/dL
Total Protein, Serum Electrophoresis: 6.2 g/dL (ref 6.0–8.3)

## 2013-07-25 ENCOUNTER — Ambulatory Visit: Payer: Medicare Other | Admitting: Oncology

## 2013-07-26 ENCOUNTER — Encounter: Payer: Self-pay | Admitting: Oncology

## 2013-07-26 ENCOUNTER — Ambulatory Visit (HOSPITAL_BASED_OUTPATIENT_CLINIC_OR_DEPARTMENT_OTHER): Payer: Medicare Other | Admitting: Oncology

## 2013-07-26 VITALS — BP 148/50 | HR 78 | Temp 97.8°F | Resp 20 | Ht 65.5 in | Wt 152.5 lb

## 2013-07-26 DIAGNOSIS — D472 Monoclonal gammopathy: Secondary | ICD-10-CM

## 2013-07-26 NOTE — Progress Notes (Signed)
Hematology and Oncology Follow Up Visit  Jeffrey Payne [id number] 01-12-1933 80 y.o. 07/26/2013 1:45 PM   Principle Diagnosis: 80 year old gentleman with a monoclonal protein, it is an IgM subtype, presented with an M-spike about 0.4 g/dL. He has an elevated IgM quantitative immunoglobulin is around 1100 on 04/2011.  Differential diagnosis include plasma cell disorders like  monoclonal gammopathy of undetermined significance, less likely multiple myeloma,  lymphoproliferative disorder like Waldenstrom's macroglobulinemia and reactive process. His Skeletal survey is normal from 05/2011.   Current therapy: Observation and surveillance.   Interim History:  Jeffrey Payne presents today for a follow up visit. Since his last visit he has been doing well with no complaints. He did have the flu but recovered quite nicely without any bacterial infection. Clinically, Jeffrey Payne is asymptomatic. He did not report any back pain. He does not report any shoulder pain. He does not report any weight loss. He has some slight fatigue reported but really no constitutional symptoms. No lymphadenopathy. He does not report any thrombotic events. He has not had any bleeding diathesis. He continues to have excellent performance status without any decline. Continue to attends to activity of daily living as well as driving and exercising at times.   Medications: I have reviewed the patient's current medications.   Current Outpatient Prescriptions  Medication Sig Dispense Refill  . acetaminophen (TYLENOL) 650 MG CR tablet Take 650 mg by mouth every 8 (eight) hours as needed.      Marland Kitchen amLODipine (NORVASC) 10 MG tablet Take 10 mg by mouth daily.      Marland Kitchen aspirin 81 MG tablet Take 81 mg by mouth daily.      . CHONDROITIN SULFATE A PO Take 600 mg by mouth daily. 2 PER DAY      . doxycycline (VIBRAMYCIN) 100 MG capsule Take 100 mg by mouth See admin instructions. For rosacea. Take 1 tablet every third day      . fish oil-omega-3  fatty acids 1000 MG capsule Take 1,000 mg by mouth 3 (three) times daily.      . fluticasone (FLONASE) 50 MCG/ACT nasal spray Place 2 sprays into the nose daily.      . furosemide (LASIX) 40 MG tablet Take 40 mg by mouth daily.      . Glucosamine-Chondroitin (GLUCOSAMINE CHONDR COMPLEX PO) Take 750 mg by mouth 2 (two) times daily.      Marland Kitchen lisinopril (PRINIVIL,ZESTRIL) 40 MG tablet Take 40 mg by mouth daily.      Marland Kitchen loratadine (CLARITIN) 10 MG tablet Take 10 mg by mouth daily. As needed      . metoprolol succinate (TOPROL-XL) 25 MG 24 hr tablet Take 50 mg by mouth daily.       . metroNIDAZOLE (METROGEL) 0.75 % gel Apply 1 application topically 2 (two) times daily.      . ranitidine (ZANTAC) 150 MG tablet Take 150 mg by mouth 2 (two) times daily.      Marland Kitchen terazosin (HYTRIN) 10 MG capsule Take 10 mg by mouth at bedtime.       No current facility-administered medications for this visit.     Allergies: No Known Allergies  Past Medical History, Surgical history, Social history, and Family History were reviewed and updated.  Review of Systems: Constitutional:  Negative for fever, chills, night sweats, anorexia, weight loss, pain. Cardiovascular: no chest pain or dyspnea on exertion Remaining ROS negative. Physical Exam: Blood pressure 148/50, pulse 78, temperature 97.8 F (36.6 C), temperature source Oral, resp. rate  20, height 5' 5.5" (1.664 m), weight 152 lb 8 oz (69.174 kg). ECOG: 1 General appearance: alert Head: Normocephalic, without obvious abnormality, atraumatic Neck: no adenopathy, no carotid bruit, no JVD, supple, symmetrical, trachea midline and thyroid not enlarged, symmetric, no tenderness/mass/nodules Lymph nodes: Cervical, supraclavicular, and axillary nodes normal. Heart:regular rate and rhythm, S1, S2 normal, no murmur, click, rub or gallop Lung:chest clear, no wheezing, rales, normal symmetric air entry Abdomin: soft, non-tender, without masses or organomegaly EXT:no  erythema, induration, or nodules   Lab Results: Lab Results  Component Value Date   WBC 7.4 07/19/2013   HGB 13.1 07/19/2013   HCT 40.3 07/19/2013   MCV 93.2 07/19/2013   PLT 194 07/19/2013     Chemistry      Component Value Date/Time   NA 140 07/19/2013 1013   NA 141 11/23/2011 1009   K 4.9 07/19/2013 1013   K 4.4 11/23/2011 1009   CL 106 07/25/2012 1256   CL 107 11/23/2011 1009   CO2 22 07/19/2013 1013   CO2 24 11/23/2011 1009   BUN 39.0* 07/19/2013 1013   BUN 37* 11/23/2011 1009   CREATININE 1.7* 07/19/2013 1013   CREATININE 1.81* 11/23/2011 1009      Component Value Date/Time   CALCIUM 9.9 07/19/2013 1013   CALCIUM 10.2 11/23/2011 1009   ALKPHOS 111 07/19/2013 1013   ALKPHOS 72 11/23/2011 1009   AST 18 07/19/2013 1013   AST 22 11/23/2011 1009   ALT 14 07/19/2013 1013   ALT 16 11/23/2011 1009   BILITOT 0.46 07/19/2013 1013   BILITOT 0.6 11/23/2011 1009     Results for Jeffrey Payne (MRN [id number]) as of 07/26/2013 13:47  Ref. Range 06/01/2012 13:29 07/25/2012 12:56 07/19/2013 10:13  IgM, Serum Latest Range: 41-251 mg/dL 813 (H) 871 (H) 1000 (H)  Results for Jeffrey Payne (MRN [id number]) as of 07/26/2013 13:47  Ref. Range 06/01/2012 13:29 07/25/2012 12:56 07/19/2013 10:13  M-SPIKE, % No range found 0.16 0.18 0.17     Impression and Plan:  80 year old gentleman with a monoclonal protein, IgM Kappa subtype, presented with an M-spike less than 0.4 g/dL (now is 0.17). He has an elevated IgM quantitative immunoglobulin is around 800s.  Differential diagnosis include plasma cell disorders like  monoclonal gammopathy of undetermined significance, less likely multiple myeloma, lymphoproliferative disorder like Waldenstrom's macroglobulinemia and reactive process can manifest with IgM monoclonal gammopathy as well. His laboratory data were reviewed today from 07/19/2013 and not dramatically changed at this time.   For now, I will continue to follow him every 12 months. I will restage him with CT scan or  bone marrow biopsy if I notice any changes in his protein studies.   Zola Button, MD 4/2/20151:45 PM

## 2013-07-27 ENCOUNTER — Telehealth: Payer: Self-pay | Admitting: Oncology

## 2013-07-27 NOTE — Telephone Encounter (Signed)
s.w. pt and advised on march adn April 2016 appts....pt ok adn aware

## 2013-12-07 ENCOUNTER — Encounter: Payer: Self-pay | Admitting: *Deleted

## 2013-12-07 DIAGNOSIS — R011 Cardiac murmur, unspecified: Secondary | ICD-10-CM

## 2013-12-07 DIAGNOSIS — I10 Essential (primary) hypertension: Secondary | ICD-10-CM | POA: Insufficient documentation

## 2014-07-22 ENCOUNTER — Other Ambulatory Visit (HOSPITAL_BASED_OUTPATIENT_CLINIC_OR_DEPARTMENT_OTHER): Payer: Medicare Other

## 2014-07-22 DIAGNOSIS — D472 Monoclonal gammopathy: Secondary | ICD-10-CM

## 2014-07-22 LAB — COMPREHENSIVE METABOLIC PANEL (CC13)
ALBUMIN: 3.3 g/dL — AB (ref 3.5–5.0)
ALT: 15 U/L (ref 0–55)
AST: 16 U/L (ref 5–34)
Alkaline Phosphatase: 109 U/L (ref 40–150)
Anion Gap: 10 mEq/L (ref 3–11)
BUN: 38.6 mg/dL — AB (ref 7.0–26.0)
CALCIUM: 9.4 mg/dL (ref 8.4–10.4)
CHLORIDE: 106 meq/L (ref 98–109)
CO2: 24 mEq/L (ref 22–29)
Creatinine: 1.8 mg/dL — ABNORMAL HIGH (ref 0.7–1.3)
EGFR: 35 mL/min/{1.73_m2} — ABNORMAL LOW (ref >90)
Glucose: 130 mg/dl (ref 70–140)
Potassium: 4.5 mEq/L (ref 3.5–5.1)
SODIUM: 140 meq/L (ref 136–145)
TOTAL PROTEIN: 6.6 g/dL (ref 6.4–8.3)
Total Bilirubin: 0.59 mg/dL (ref 0.20–1.20)

## 2014-07-22 LAB — CBC WITH DIFFERENTIAL/PLATELET
BASO%: 0.3 % (ref 0.0–2.0)
BASOS ABS: 0 10*3/uL (ref 0.0–0.1)
EOS%: 1.5 % (ref 0.0–7.0)
Eosinophils Absolute: 0.1 10*3/uL (ref 0.0–0.5)
HCT: 41.8 % (ref 38.4–49.9)
HEMOGLOBIN: 13.8 g/dL (ref 13.0–17.1)
LYMPH#: 1.8 10*3/uL (ref 0.9–3.3)
LYMPH%: 25 % (ref 14.0–49.0)
MCH: 31.1 pg (ref 27.2–33.4)
MCHC: 33 g/dL (ref 32.0–36.0)
MCV: 94.1 fL (ref 79.3–98.0)
MONO#: 1 10*3/uL — AB (ref 0.1–0.9)
MONO%: 13.4 % (ref 0.0–14.0)
NEUT#: 4.3 10*3/uL (ref 1.5–6.5)
NEUT%: 59.8 % (ref 39.0–75.0)
PLATELETS: 266 10*3/uL (ref 140–400)
RBC: 4.44 10*6/uL (ref 4.20–5.82)
RDW: 14 % (ref 11.0–14.6)
WBC: 7.2 10*3/uL (ref 4.0–10.3)

## 2014-07-24 LAB — SPEP & IFE WITH QIG
ABNORMAL PROTEIN BAND1: 0.2 g/dL
ABNORMAL PROTEIN BAND3: NOT DETECTED g/dL
Abnormal Protein Band2: 0.3 g/dL
Albumin ELP: 3.5 g/dL — ABNORMAL LOW (ref 3.8–4.8)
Alpha-1-Globulin: 0.4 g/dL — ABNORMAL HIGH (ref 0.2–0.3)
Alpha-2-Globulin: 0.9 g/dL (ref 0.5–0.9)
Beta 2: 0.7 g/dL — ABNORMAL HIGH (ref 0.2–0.5)
Beta Globulin: 0.4 g/dL (ref 0.4–0.6)
Gamma Globulin: 0.5 g/dL — ABNORMAL LOW (ref 0.8–1.7)
IGA: 63 mg/dL — AB (ref 68–379)
IGG (IMMUNOGLOBIN G), SERUM: 270 mg/dL — AB (ref 650–1600)
IGM, SERUM: 967 mg/dL — AB (ref 41–251)
Total Protein, Serum Electrophoresis: 6.3 g/dL (ref 6.1–8.1)

## 2014-07-30 ENCOUNTER — Telehealth: Payer: Self-pay | Admitting: Oncology

## 2014-07-30 ENCOUNTER — Ambulatory Visit (HOSPITAL_BASED_OUTPATIENT_CLINIC_OR_DEPARTMENT_OTHER): Payer: Medicare Other | Admitting: Oncology

## 2014-07-30 VITALS — BP 145/50 | HR 68 | Temp 97.8°F | Resp 18 | Ht 65.5 in | Wt 154.6 lb

## 2014-07-30 DIAGNOSIS — N289 Disorder of kidney and ureter, unspecified: Secondary | ICD-10-CM

## 2014-07-30 DIAGNOSIS — D472 Monoclonal gammopathy: Secondary | ICD-10-CM | POA: Diagnosis not present

## 2014-07-30 NOTE — Progress Notes (Signed)
Hematology and Oncology Follow Up Visit  Jeffrey Payne [id number] [birth year] 81 y.o. 07/30/2014 10:44 AM   Principle Diagnosis: 81 year old gentleman with a monoclonal protein; IgM subtype, presented with an M-spike about 0.4 g/dL. He has an elevated IgM quantitative immunoglobulin is around 1100 on 04/2011.  Differential diagnosis include plasma cell disorders like  monoclonal gammopathy of undetermined significance, less likely multiple myeloma, lymphoproliferative disorder like Waldenstrom's macroglobulinemia and reactive process. His Skeletal survey is normal from 05/2011.   Current therapy: Observation and surveillance.   Interim History:  Jeffrey Payne presents today for a follow up visit with his wife. Since his last visit, he reports no new issues. He did not have the flu this year and had not had any hospitalization or illnesses. He does report chronic knee pain and chronic back pain which is unchanged. He does not report any shoulder pain. He does not report any weight loss. He has some slight fatigue reported but really no constitutional symptoms. No lymphadenopathy. He does not report any thrombotic events. He has not had any bleeding diathesis. He continues to have excellent performance status without any decline.   He does not report any headaches, blurry vision, syncope or seizures. He does not report any fevers, chills or sweats. He does not report any chest pain, palpitation or orthopnea. He does not report any nausea, vomiting, abdominal pain. He does not report any cough, wheezing or hemoptysis. He is not report any frequency urgency or hesitancy. He does not report any lymphadenopathy or petechiae. Remaining review of systems unremarkable.   Medications: I have reviewed the patient's current medications.   Current Outpatient Prescriptions  Medication Sig Dispense Refill  . acetaminophen (TYLENOL) 650 MG CR tablet Take 650 mg by mouth every 8 (eight) hours as needed.    Marland Kitchen  amLODipine (NORVASC) 10 MG tablet Take 10 mg by mouth daily.    Marland Kitchen aspirin 81 MG tablet Take 81 mg by mouth daily.    . CHONDROITIN SULFATE A PO Take 600 mg by mouth daily. 2 PER DAY    . fish oil-omega-3 fatty acids 1000 MG capsule Take 1,000 mg by mouth 3 (three) times daily.    . fluticasone (FLONASE) 50 MCG/ACT nasal spray Place 2 sprays into the nose daily.    . furosemide (LASIX) 40 MG tablet Take 40 mg by mouth daily.    . Glucosamine-Chondroitin (GLUCOSAMINE CHONDR COMPLEX PO) Take 750 mg by mouth 2 (two) times daily.    Marland Kitchen lisinopril (PRINIVIL,ZESTRIL) 40 MG tablet Take 40 mg by mouth daily.    Marland Kitchen loratadine (CLARITIN) 10 MG tablet Take 10 mg by mouth daily. As needed    . metoprolol succinate (TOPROL-XL) 50 MG 24 hr tablet Take 50 mg by mouth daily.  1  . metroNIDAZOLE (METROGEL) 0.75 % gel Apply 1 application topically 2 (two) times daily.    . ranitidine (ZANTAC) 150 MG tablet Take 150 mg by mouth 2 (two) times daily.    Marland Kitchen terazosin (HYTRIN) 10 MG capsule Take 10 mg by mouth at bedtime.     No current facility-administered medications for this visit.     Allergies: No Known Allergies  Past Medical History, Surgical history, Social history, and Family History were reviewed and updated.  Physical Exam: Blood pressure 145/50, pulse 68, temperature 97.8 F (36.6 C), temperature source Oral, resp. rate 18, height 5' 5.5" (1.664 m), weight 154 lb 9.6 oz (70.126 kg). ECOG: 1 General appearance: alert awake not in any distress. Head: Normocephalic, without  obvious abnormality, atraumatic Neck: no adenopathy Lymph nodes: Cervical, supraclavicular, and axillary nodes normal. Heart:regular rate and rhythm, S1, S2 normal, no murmur, click, rub or gallop Lung:chest clear, no wheezing, rales, normal symmetric air entry Abdomin: soft, non-tender, without masses or organomegaly EXT:no erythema, induration, or nodules   Lab Results: Lab Results  Component Value Date   WBC 7.2  07/22/2014   HGB 13.8 07/22/2014   HCT 41.8 07/22/2014   MCV 94.1 07/22/2014   PLT 266 07/22/2014     Chemistry      Component Value Date/Time   NA 140 07/22/2014 1000   NA 141 11/23/2011 1009   K 4.5 07/22/2014 1000   K 4.4 11/23/2011 1009   CL 106 07/25/2012 1256   CL 107 11/23/2011 1009   CO2 24 07/22/2014 1000   CO2 24 11/23/2011 1009   BUN 38.6* 07/22/2014 1000   BUN 37* 11/23/2011 1009   CREATININE 1.8* 07/22/2014 1000   CREATININE 1.81* 11/23/2011 1009      Component Value Date/Time   CALCIUM 9.4 07/22/2014 1000   CALCIUM 10.2 11/23/2011 1009   ALKPHOS 109 07/22/2014 1000   ALKPHOS 72 11/23/2011 1009   AST 16 07/22/2014 1000   AST 22 11/23/2011 1009   ALT 15 07/22/2014 1000   ALT 16 11/23/2011 1009   BILITOT 0.59 07/22/2014 1000   BILITOT 0.6 11/23/2011 1009      Results for Jeffrey Payne, Jeffrey Payne (MRN [id number]) as of 07/30/2014 10:29  Ref. Range 07/25/2012 12:56 07/19/2013 10:13 07/19/2013 10:13 07/22/2014 10:00 07/22/2014 10:00  Albumin ELP Latest Range: 3.8-4.8 g/dL 60.3 56.4   3.5 (L)  COMMENT (PROTEIN ELECTROPHOR) No range found * *   *  Alpha-1-Globulin Latest Range: 0.2-0.3 g/dL 4.6 5.4 (H)   0.4 (H)  Alpha-2-Globulin Latest Range: 0.5-0.9 g/dL 12.3 (H) 14.3 (H)   0.9  Beta Globulin Latest Range: 0.4-0.6 g/dL 5.3 5.2   0.4  Beta 2 Latest Range: 0.2-0.5 g/dL 6.9 (H) 9.5 (H)   0.7 (H)  Gamma Globulin Latest Range: 0.8-1.7 g/dL 10.6 (L) 9.2 (L)   0.5 (L)  M-SPIKE, % Latest Units: g/dL 0.18 0.17     SPE Interp. No range found * *   *  IgG (Immunoglobin G), Serum Latest Range: [phone] mg/dL 350 (L) 315 (L)   270 (L)  IgA Latest Range: 68-379 mg/dL 52 (L) 65 (L)   63 (L)  IgM, Serum Latest Range: 41-251 mg/dL 871 (H) 1000 (H)   967 (H)  Total Protein, Serum Electrophoresis Latest Range: 6.1-8.1 g/dL 6.5 6.2   6.3  Kappa free light chain Latest Range: 0.33-1.94 mg/dL 2.29 (H)      Lambda Free Lght Chn Latest Range: 0.57-2.63 mg/dL 0.89      Kappa:Lambda Ratio Latest  Range: 0.26-1.65  2.57 (H)         Impression and Plan:  81 year old gentleman with:  1. Monoclonal protein; IgM Kappa subtype, presented with an M-spike less than 0.4 g/dL (now is 0.17). He has an elevated IgM quantitative immunoglobulin is around 800s.  Differential diagnosis include plasma cell disorders like  monoclonal gammopathy of undetermined significance, less likely multiple myeloma, lymphoproliferative disorder like Waldenstrom's macroglobulinemia and reactive process can manifest with IgM monoclonal gammopathy as well.   His laboratory data from 07/22/2014 were reviewed and his protein studies really unchanged from previous years. He is clinically asymptomatic and the plan is to continue with active surveillance. We will repeat laboratory testing and 12 months. I will restage him with  CT scan or bone marrow biopsy if I notice any changes in his protein studies.   2. Renal insufficiency: His creatinine is relatively stable.  3. Follow-up: Will be in 12 months.  [facility], MD 4/5/201610:44 AM

## 2014-07-30 NOTE — Telephone Encounter (Signed)
gave and printed appt sched and avs fo rpt for April 2017 °

## 2015-07-29 ENCOUNTER — Other Ambulatory Visit (HOSPITAL_BASED_OUTPATIENT_CLINIC_OR_DEPARTMENT_OTHER): Payer: Medicare Other

## 2015-07-29 DIAGNOSIS — D472 Monoclonal gammopathy: Secondary | ICD-10-CM

## 2015-07-29 LAB — CBC WITH DIFFERENTIAL/PLATELET
BASO%: 0.9 % (ref 0.0–2.0)
Basophils Absolute: 0.1 10*3/uL (ref 0.0–0.1)
EOS%: 1.5 % (ref 0.0–7.0)
Eosinophils Absolute: 0.1 10*3/uL (ref 0.0–0.5)
HCT: 40.8 % (ref 38.4–49.9)
HEMOGLOBIN: 13.2 g/dL (ref 13.0–17.1)
LYMPH#: 1.8 10*3/uL (ref 0.9–3.3)
LYMPH%: 21.8 % (ref 14.0–49.0)
MCH: 30.4 pg (ref 27.2–33.4)
MCHC: 32.4 g/dL (ref 32.0–36.0)
MCV: 93.9 fL (ref 79.3–98.0)
MONO#: 1.2 10*3/uL — ABNORMAL HIGH (ref 0.1–0.9)
MONO%: 14 % (ref 0.0–14.0)
NEUT%: 61.8 % (ref 39.0–75.0)
NEUTROS ABS: 5.1 10*3/uL (ref 1.5–6.5)
PLATELETS: 284 10*3/uL (ref 140–400)
RBC: 4.34 10*6/uL (ref 4.20–5.82)
RDW: 14.2 % (ref 11.0–14.6)
WBC: 8.3 10*3/uL (ref 4.0–10.3)

## 2015-07-29 LAB — COMPREHENSIVE METABOLIC PANEL
ALT: 11 U/L (ref 0–55)
AST: 17 U/L (ref 5–34)
Albumin: 3.3 g/dL — ABNORMAL LOW (ref 3.5–5.0)
Alkaline Phosphatase: 99 U/L (ref 40–150)
Anion Gap: 8 mEq/L (ref 3–11)
BILIRUBIN TOTAL: 0.52 mg/dL (ref 0.20–1.20)
BUN: 43.2 mg/dL — AB (ref 7.0–26.0)
CHLORIDE: 107 meq/L (ref 98–109)
CO2: 25 mEq/L (ref 22–29)
Calcium: 9.7 mg/dL (ref 8.4–10.4)
Creatinine: 1.8 mg/dL — ABNORMAL HIGH (ref 0.7–1.3)
EGFR: 34 mL/min/{1.73_m2} — ABNORMAL LOW (ref >90)
Glucose: 139 mg/dl (ref 70–140)
Potassium: 4.4 mEq/L (ref 3.5–5.1)
Sodium: 140 mEq/L (ref 136–145)
Total Protein: 6.8 g/dL (ref 6.4–8.3)

## 2015-07-31 LAB — MULTIPLE MYELOMA PANEL, SERUM
ALBUMIN SERPL ELPH-MCNC: 3.5 g/dL (ref 2.9–4.4)
ALPHA 1: 0.2 g/dL (ref 0.0–0.4)
Albumin/Glob SerPl: 1.3 (ref 0.7–1.7)
Alpha2 Glob SerPl Elph-Mcnc: 0.8 g/dL (ref 0.4–1.0)
B-Globulin SerPl Elph-Mcnc: 1.2 g/dL (ref 0.7–1.3)
GAMMA GLOB SERPL ELPH-MCNC: 0.4 g/dL (ref 0.4–1.8)
GLOBULIN, TOTAL: 2.7 g/dL (ref 2.2–3.9)
IGA/IMMUNOGLOBULIN A, SERUM: 70 mg/dL (ref 61–437)
IgG, Qn, Serum: 260 mg/dL — ABNORMAL LOW (ref 700–1600)
M Protein SerPl Elph-Mcnc: 0.4 g/dL — ABNORMAL HIGH
TOTAL PROTEIN: 6.2 g/dL (ref 6.0–8.5)

## 2015-08-05 ENCOUNTER — Telehealth: Payer: Self-pay | Admitting: Oncology

## 2015-08-05 ENCOUNTER — Ambulatory Visit (HOSPITAL_BASED_OUTPATIENT_CLINIC_OR_DEPARTMENT_OTHER): Payer: Medicare Other | Admitting: Oncology

## 2015-08-05 VITALS — BP 150/51 | HR 71 | Temp 98.0°F | Resp 18 | Ht 65.5 in | Wt 153.7 lb

## 2015-08-05 DIAGNOSIS — N289 Disorder of kidney and ureter, unspecified: Secondary | ICD-10-CM | POA: Diagnosis not present

## 2015-08-05 DIAGNOSIS — D729 Disorder of white blood cells, unspecified: Secondary | ICD-10-CM

## 2015-08-05 NOTE — Telephone Encounter (Signed)
per pof to sch pt appt-gave pt copy of avs °

## 2015-08-05 NOTE — Progress Notes (Signed)
Hematology and Oncology Follow Up Visit  Jeffrey Payne [id number] May 13, 1932 82 y.o. 08/05/2015 10:45 AM   Principle Diagnosis: 82 year old gentleman with a monoclonal protein; IgM subtype, presented with an M-spike about 0.4 g/dL. He has an elevated IgM quantitative immunoglobulin is around 1100 on 04/2011.  Differential diagnosis include plasma cell disorders like  monoclonal gammopathy of undetermined significance, less likely multiple myeloma, lymphoproliferative disorder like Waldenstrom's macroglobulinemia and reactive process. His Skeletal survey is normal from 05/2011.   Current therapy: Observation and surveillance.   Interim History:  Jeffrey Payne presents today for a follow up visit with his wife. Since his last visit, he continues to be about the same. He remains reasonable functional and lives independently with his wife. He is able to perform activities of daily living including driving short distances and walking the dog. He does not report any recent illnesses or hospitalization. Has not reported any decline in his quality of life.  He does not report any weight loss. His appetite is excellent without any changes. He has some slight fatigue reported but really no constitutional symptoms. No lymphadenopathy. He does not report any thrombotic events. He has not had any bleeding diathesis. He denied any lymphadenopathy or petechiae. He denied any recurrent infections.   He does not report any headaches, blurry vision, syncope or seizures. He does not report any fevers, chills or sweats. He does not report any chest pain, palpitation or orthopnea. He does not report any nausea, vomiting, abdominal pain. He does not report any cough, wheezing or hemoptysis. He is not report any frequency urgency or hesitancy. He does not report any lymphadenopathy or petechiae. Remaining review of systems unremarkable.   Medications: I have reviewed the patient's current medications.   Current Outpatient  Prescriptions  Medication Sig Dispense Refill  . acetaminophen (TYLENOL) 650 MG CR tablet Take 650 mg by mouth every 8 (eight) hours as needed.    Marland Kitchen amLODipine (NORVASC) 10 MG tablet Take 10 mg by mouth daily.    Marland Kitchen aspirin 81 MG tablet Take 81 mg by mouth daily.    . CHONDROITIN SULFATE A PO Take 600 mg by mouth daily. 2 PER DAY    . fish oil-omega-3 fatty acids 1000 MG capsule Take 1,000 mg by mouth 3 (three) times daily.    . fluticasone (FLONASE) 50 MCG/ACT nasal spray Place 2 sprays into the nose daily.    . furosemide (LASIX) 40 MG tablet Take 40 mg by mouth daily.    . Glucosamine-Chondroitin (GLUCOSAMINE CHONDR COMPLEX PO) Take 750 mg by mouth 2 (two) times daily.    Marland Kitchen lisinopril (PRINIVIL,ZESTRIL) 40 MG tablet Take 40 mg by mouth daily.    Marland Kitchen loratadine (CLARITIN) 10 MG tablet Take 10 mg by mouth daily. As needed    . metoprolol succinate (TOPROL-XL) 50 MG 24 hr tablet Take 50 mg by mouth daily.  1  . ranitidine (ZANTAC) 150 MG tablet Take 150 mg by mouth 2 (two) times daily.    Marland Kitchen terazosin (HYTRIN) 10 MG capsule Take 10 mg by mouth at bedtime.     No current facility-administered medications for this visit.     Allergies: No Known Allergies  Past Medical History, Surgical history, Social history, and Family History were reviewed and updated.  Physical Exam: Blood pressure 150/51, pulse 71, temperature 98 F (36.7 C), temperature source Oral, resp. rate 18, height 5' 5.5" (1.664 m), weight 153 lb 11.2 oz (69.718 kg), SpO2 98 %. ECOG: 1 General appearance: Alert, awake  gentleman without distress. Head: Normocephalic, without obvious abnormality Neck: no adenopathy Lymph nodes: Cervical, supraclavicular, and axillary nodes normal. Heart:regular rate and rhythm, S1, S2 normal, no murmur, click, rub or gallop Lung:chest clear, no wheezing, rales, normal symmetric air entry Abdomin: soft, non-tender, without masses or organomegaly no shifting dullness or ascites. EXT:no erythema,  induration, or nodules   Lab Results: Lab Results  Component Value Date   WBC 8.3 07/29/2015   HGB 13.2 07/29/2015   HCT 40.8 07/29/2015   MCV 93.9 07/29/2015   PLT 284 07/29/2015     Chemistry      Component Value Date/Time   NA 140 07/29/2015 1014   NA 141 11/23/2011 1009   K 4.4 07/29/2015 1014   K 4.4 11/23/2011 1009   CL 106 07/25/2012 1256   CL 107 11/23/2011 1009   CO2 25 07/29/2015 1014   CO2 24 11/23/2011 1009   BUN 43.2* 07/29/2015 1014   BUN 37* 11/23/2011 1009   CREATININE 1.8* 07/29/2015 1014   CREATININE 1.81* 11/23/2011 1009      Component Value Date/Time   CALCIUM 9.7 07/29/2015 1014   CALCIUM 10.2 11/23/2011 1009   ALKPHOS 99 07/29/2015 1014   ALKPHOS 72 11/23/2011 1009   AST 17 07/29/2015 1014   AST 22 11/23/2011 1009   ALT 11 07/29/2015 1014   ALT 16 11/23/2011 1009   BILITOT 0.52 07/29/2015 1014   BILITOT 0.6 11/23/2011 1009     Results for Jeffrey Payne (MRN [id number]) as of 08/05/2015 10:36  Ref. Range 07/29/2015 10:13  IgM, Qn, Serum Latest Ref Range: 15-143 mg/dL 1,078 (H)  M Protein SerPl Elph-Mcnc Latest Ref Range: Not Observed g/dL 0.4 (H)     Impression and Plan:  82 year old gentleman with:  1. Monoclonal protein; IgM Kappa subtype, presented with an M-spike less than 0.4 g/dL. He has an elevated IgM quantitative immunoglobulin Of 1000 at the time of diagnosis. Differential diagnosis include plasma cell disorders like  monoclonal gammopathy of undetermined significance, less likely multiple myeloma, lymphoproliferative disorder like Waldenstrom's macroglobulinemia and reactive process can manifest with IgM monoclonal gammopathy as well.   Laboratory data from April 2017 were reviewed and showed no difference to previous testing in the last 4 years. His total IgM is close to 1000 and his M spike is around 0.4 g/dL. He has no evidence to suggest end organ damage including normal CBC, normal calcium and total protein. His creatinine is  at baseline.  The plan is to continue with active surveillance and continue to monitor his protein studies annually. I educated her about signs or symptoms that would suggest change in our surveillance strategies. The symptoms of include weight loss, fevers, lymphadenopathy or recurrent infections.  2. Renal insufficiency: His creatinine is relatively stable. Unrelated to her plasma cell disorder.  3. Follow-up: Will be in 12 months.  [facility], MD 4/11/201710:45 AM

## 2016-02-18 ENCOUNTER — Other Ambulatory Visit: Payer: Self-pay | Admitting: Family Medicine

## 2016-02-18 DIAGNOSIS — R011 Cardiac murmur, unspecified: Secondary | ICD-10-CM

## 2016-03-11 ENCOUNTER — Ambulatory Visit (HOSPITAL_COMMUNITY): Payer: Medicare Other | Attending: Cardiology

## 2016-03-11 ENCOUNTER — Other Ambulatory Visit: Payer: Self-pay

## 2016-03-11 DIAGNOSIS — I119 Hypertensive heart disease without heart failure: Secondary | ICD-10-CM | POA: Insufficient documentation

## 2016-03-11 DIAGNOSIS — I351 Nonrheumatic aortic (valve) insufficiency: Secondary | ICD-10-CM | POA: Insufficient documentation

## 2016-03-11 DIAGNOSIS — R011 Cardiac murmur, unspecified: Secondary | ICD-10-CM | POA: Diagnosis not present

## 2016-07-28 ENCOUNTER — Telehealth: Payer: Self-pay | Admitting: Oncology

## 2016-07-28 ENCOUNTER — Other Ambulatory Visit (HOSPITAL_BASED_OUTPATIENT_CLINIC_OR_DEPARTMENT_OTHER): Payer: Medicare Other

## 2016-07-28 DIAGNOSIS — D729 Disorder of white blood cells, unspecified: Secondary | ICD-10-CM

## 2016-07-28 DIAGNOSIS — D472 Monoclonal gammopathy: Secondary | ICD-10-CM

## 2016-07-28 LAB — COMPREHENSIVE METABOLIC PANEL
ALBUMIN: 3.4 g/dL — AB (ref 3.5–5.0)
ALK PHOS: 124 U/L (ref 40–150)
ALT: 11 U/L (ref 0–55)
ANION GAP: 9 meq/L (ref 3–11)
AST: 15 U/L (ref 5–34)
BUN: 38.3 mg/dL — AB (ref 7.0–26.0)
CALCIUM: 9.9 mg/dL (ref 8.4–10.4)
CHLORIDE: 105 meq/L (ref 98–109)
CO2: 24 mEq/L (ref 22–29)
Creatinine: 1.9 mg/dL — ABNORMAL HIGH (ref 0.7–1.3)
EGFR: 33 mL/min/{1.73_m2} — AB (ref >90)
Glucose: 123 mg/dl (ref 70–140)
POTASSIUM: 4.4 meq/L (ref 3.5–5.1)
Sodium: 139 mEq/L (ref 136–145)
Total Bilirubin: 0.47 mg/dL (ref 0.20–1.20)
Total Protein: 6.7 g/dL (ref 6.4–8.3)

## 2016-07-28 LAB — CBC WITH DIFFERENTIAL/PLATELET
BASO%: 0.6 % (ref 0.0–2.0)
BASOS ABS: 0.1 10*3/uL (ref 0.0–0.1)
EOS ABS: 0.1 10*3/uL (ref 0.0–0.5)
EOS%: 1.2 % (ref 0.0–7.0)
HEMATOCRIT: 38.9 % (ref 38.4–49.9)
HEMOGLOBIN: 12.8 g/dL — AB (ref 13.0–17.1)
LYMPH%: 20.8 % (ref 14.0–49.0)
MCH: 30.6 pg (ref 27.2–33.4)
MCHC: 32.9 g/dL (ref 32.0–36.0)
MCV: 92.9 fL (ref 79.3–98.0)
MONO#: 1.2 10*3/uL — AB (ref 0.1–0.9)
MONO%: 14.1 % — ABNORMAL HIGH (ref 0.0–14.0)
NEUT#: 5.5 10*3/uL (ref 1.5–6.5)
NEUT%: 63.3 % (ref 39.0–75.0)
PLATELETS: 269 10*3/uL (ref 140–400)
RBC: 4.19 10*6/uL — ABNORMAL LOW (ref 4.20–5.82)
RDW: 13.7 % (ref 11.0–14.6)
WBC: 8.6 10*3/uL (ref 4.0–10.3)
lymph#: 1.8 10*3/uL (ref 0.9–3.3)

## 2016-07-28 NOTE — Telephone Encounter (Signed)
Patient thought they had a lab appt today. Per Dr Alen Blew okay for patient to go ahead and get labs drawn today , not too early .lab appt r/s

## 2016-07-29 LAB — MULTIPLE MYELOMA PANEL, SERUM
ALBUMIN SERPL ELPH-MCNC: 3.6 g/dL (ref 2.9–4.4)
ALBUMIN/GLOB SERPL: 1.4 (ref 0.7–1.7)
ALPHA 1: 0.3 g/dL (ref 0.0–0.4)
ALPHA2 GLOB SERPL ELPH-MCNC: 0.9 g/dL (ref 0.4–1.0)
B-Globulin SerPl Elph-Mcnc: 1.1 g/dL (ref 0.7–1.3)
GAMMA GLOB SERPL ELPH-MCNC: 0.4 g/dL (ref 0.4–1.8)
GLOBULIN, TOTAL: 2.7 g/dL (ref 2.2–3.9)
IGA/IMMUNOGLOBULIN A, SERUM: 70 mg/dL (ref 61–437)
IGG (IMMUNOGLOBIN G), SERUM: 302 mg/dL — AB (ref 700–1600)
IgM, Qn, Serum: 1080 mg/dL — ABNORMAL HIGH (ref 15–143)
M Protein SerPl Elph-Mcnc: 0.4 g/dL — ABNORMAL HIGH
Total Protein: 6.3 g/dL (ref 6.0–8.5)

## 2016-08-04 ENCOUNTER — Other Ambulatory Visit: Payer: Medicare Other

## 2016-08-11 ENCOUNTER — Telehealth: Payer: Self-pay | Admitting: Oncology

## 2016-08-11 ENCOUNTER — Ambulatory Visit (HOSPITAL_BASED_OUTPATIENT_CLINIC_OR_DEPARTMENT_OTHER): Payer: Medicare Other | Admitting: Oncology

## 2016-08-11 VITALS — BP 135/39 | HR 74 | Temp 97.8°F | Resp 18 | Ht 65.5 in | Wt 149.4 lb

## 2016-08-11 DIAGNOSIS — N289 Disorder of kidney and ureter, unspecified: Secondary | ICD-10-CM | POA: Diagnosis not present

## 2016-08-11 DIAGNOSIS — D472 Monoclonal gammopathy: Secondary | ICD-10-CM | POA: Diagnosis not present

## 2016-08-11 NOTE — Telephone Encounter (Signed)
Appointments scheduled per 4.18.18 LOS. Patient given AVS report and calendars with future scheduled appointments. °

## 2016-08-11 NOTE — Progress Notes (Signed)
Hematology and Oncology Follow Up Visit  Jeffrey Payne [id number] 10/18/1932 83 y.o. 08/11/2016 11:04 AM   Principle Diagnosis: 83 year old gentleman with a monoclonal protein; IgM subtype, presented with an M-spike about 0.4 g/dL. He has an elevated IgM quantitative immunoglobulin is around 1100 on 04/2011.  These findings are related to monoclonal gammopathy of undetermined significance (lymphoproliferative disorder like Waldenstrom's macroglobulinemia or reactive process. His Skeletal survey is normal from 05/2011.   Current therapy: Observation and surveillance.   Interim History:  Jeffrey Payne presents today for a follow up visit with his wife. Since his last visit, he reports no major changes in his health. He is able to perform activities of daily living including driving short distances. He does not report any recent illnesses or hospitalization. Has not reported any decline in his quality of life. He denied any recurrent infections, peripheral neuropathy or constitutional symptoms. His appetite remains excellent and his weight is stable. He denied any lymphadenopathy or petechiae.   He does not report any headaches, blurry vision, syncope or seizures. He does not report any fevers, chills or sweats. He does not report any chest pain, palpitation or orthopnea. He does not report any nausea, vomiting, abdominal pain. He does not report any cough, wheezing or hemoptysis. He is not report any frequency urgency or hesitancy. He does not report any lymphadenopathy or petechiae. Remaining review of systems unremarkable.   Medications: I have reviewed the patient's current medications.   Current Outpatient Prescriptions  Medication Sig Dispense Refill  . acetaminophen (TYLENOL) 650 MG CR tablet Take 650 mg by mouth every 8 (eight) hours as needed.    Marland Kitchen amLODipine (NORVASC) 10 MG tablet Take 10 mg by mouth daily.    Marland Kitchen aspirin 81 MG tablet Take 81 mg by mouth daily.    . CHONDROITIN SULFATE A PO  Take 600 mg by mouth daily. 2 PER DAY    . fish oil-omega-3 fatty acids 1000 MG capsule Take 1,000 mg by mouth 3 (three) times daily.    . fluticasone (FLONASE) 50 MCG/ACT nasal spray Place 2 sprays into the nose daily.    . furosemide (LASIX) 40 MG tablet Take 40 mg by mouth daily.    . Glucosamine-Chondroitin (GLUCOSAMINE CHONDR COMPLEX PO) Take 750 mg by mouth 2 (two) times daily.    Marland Kitchen lisinopril (PRINIVIL,ZESTRIL) 40 MG tablet Take 40 mg by mouth daily.    Marland Kitchen loratadine (CLARITIN) 10 MG tablet Take 10 mg by mouth daily. As needed    . metoprolol succinate (TOPROL-XL) 50 MG 24 hr tablet Take 50 mg by mouth daily.  1  . ranitidine (ZANTAC) 150 MG tablet Take 150 mg by mouth 2 (two) times daily.    Marland Kitchen terazosin (HYTRIN) 10 MG capsule Take 10 mg by mouth at bedtime.     No current facility-administered medications for this visit.      Allergies: No Known Allergies  Past Medical History, Surgical history, Social history, and Family History were reviewed and updated.  Physical Exam: Blood pressure (!) 135/39, pulse 74, temperature 97.8 F (36.6 C), temperature source Oral, resp. rate 18, height 5' 5.5" (1.664 m), weight 149 lb 6.4 oz (67.8 kg), SpO2 98 %. ECOG: 1 General appearance: Well-appearing gentleman without distress. Head: Normocephalic, without obvious abnormality oral ulcers or lesions. Neck: no adenopathy Lymph nodes: Cervical, supraclavicular, and axillary nodes normal. Heart:regular rate and rhythm, S1, S2 normal, no murmur, click, rub or gallop Lung:chest clear, no wheezing, rales, normal symmetric air entry Abdomin: soft,  non-tender, without masses or organomegaly no rebound or guarding. EXT:no erythema, induration, or nodules   Lab Results: Lab Results  Component Value Date   WBC 8.6 07/28/2016   HGB 12.8 (L) 07/28/2016   HCT 38.9 07/28/2016   MCV 92.9 07/28/2016   PLT 269 07/28/2016     Chemistry      Component Value Date/Time   NA 139 07/28/2016 1038   K  4.4 07/28/2016 1038   CL 106 07/25/2012 1256   CO2 24 07/28/2016 1038   BUN 38.3 (H) 07/28/2016 1038   CREATININE 1.9 (H) 07/28/2016 1038      Component Value Date/Time   CALCIUM 9.9 07/28/2016 1038   ALKPHOS 124 07/28/2016 1038   AST 15 07/28/2016 1038   ALT 11 07/28/2016 1038   BILITOT 0.47 07/28/2016 1038      Results for OTHON, GUARDIA (MRN [id number]) as of 08/11/2016 10:34  Ref. Range 07/29/2015 10:13 07/28/2016 10:38  M Protein SerPl Elph-Mcnc Latest Ref Range: Not Observed g/dL 0.4 (H) 0.4 (H)     Impression and Plan:  83 year old gentleman with:  1. Monoclonal protein; IgM Kappa subtype, presented with an M-spike less than 0.4 g/dL. He has an elevated IgM quantitative immunoglobulin Of 1000 at the time of diagnosis. Differential diagnosis include plasma cell disorders like  monoclonal gammopathy of undetermined significance, less likely multiple myeloma, lymphoproliferative disorder like Waldenstrom's macroglobulinemia and reactive process can manifest with IgM monoclonal gammopathy as well.   Laboratory data from April 2018 were reviewed and showed stable findings including identical M spike and IgM total. There is no signs or symptoms to suggest end organ damage or evolving multiple myeloma.  The plan is to continue with active surveillance and continue to monitor his protein studies annually.   2. Renal insufficiency: His creatinine is relatively stable. Unrelated to her plasma cell disorder.  3. Follow-up: Will be in 12 months.  [facility], MD 4/18/201811:04 AM

## 2016-09-17 ENCOUNTER — Other Ambulatory Visit (HOSPITAL_COMMUNITY): Payer: Self-pay | Admitting: Nephrology

## 2016-09-17 DIAGNOSIS — N183 Chronic kidney disease, stage 3 unspecified: Secondary | ICD-10-CM

## 2016-09-22 ENCOUNTER — Other Ambulatory Visit: Payer: Self-pay | Admitting: Physician Assistant

## 2016-09-23 ENCOUNTER — Encounter (HOSPITAL_COMMUNITY): Payer: Self-pay

## 2016-09-23 ENCOUNTER — Other Ambulatory Visit (HOSPITAL_COMMUNITY): Payer: Self-pay | Admitting: Nephrology

## 2016-09-23 ENCOUNTER — Ambulatory Visit (HOSPITAL_COMMUNITY)
Admission: RE | Admit: 2016-09-23 | Discharge: 2016-09-23 | Disposition: A | Discharge status: Home / Self Care | Payer: Medicare Other | Source: Ambulatory Visit | Attending: Nephrology | Admitting: Nephrology

## 2016-09-23 DIAGNOSIS — K219 Gastro-esophageal reflux disease without esophagitis: Secondary | ICD-10-CM | POA: Diagnosis not present

## 2016-09-23 DIAGNOSIS — R809 Proteinuria, unspecified: Secondary | ICD-10-CM | POA: Insufficient documentation

## 2016-09-23 DIAGNOSIS — N183 Chronic kidney disease, stage 3 unspecified: Secondary | ICD-10-CM

## 2016-09-23 DIAGNOSIS — I129 Hypertensive chronic kidney disease with stage 1 through stage 4 chronic kidney disease, or unspecified chronic kidney disease: Secondary | ICD-10-CM | POA: Insufficient documentation

## 2016-09-23 DIAGNOSIS — E781 Pure hyperglyceridemia: Secondary | ICD-10-CM | POA: Diagnosis not present

## 2016-09-23 DIAGNOSIS — L719 Rosacea, unspecified: Secondary | ICD-10-CM | POA: Insufficient documentation

## 2016-09-23 LAB — CBC
HCT: 39.6 % (ref 39.0–52.0)
Hemoglobin: 13 g/dL (ref 13.0–17.0)
MCH: 30.4 pg (ref 26.0–34.0)
MCHC: 32.8 g/dL (ref 30.0–36.0)
MCV: 92.5 fL (ref 78.0–100.0)
PLATELETS: 291 10*3/uL (ref 150–400)
RBC: 4.28 MIL/uL (ref 4.22–5.81)
RDW: 14.9 % (ref 11.5–15.5)
WBC: 9.5 10*3/uL (ref 4.0–10.5)

## 2016-09-23 LAB — APTT: aPTT: 38 seconds — ABNORMAL HIGH (ref 24–36)

## 2016-09-23 LAB — PROTIME-INR
INR: 1.02
Prothrombin Time: 13.4 seconds (ref 11.4–15.2)

## 2016-09-23 MED ORDER — SODIUM CHLORIDE 0.9 % IV SOLN: INTRAVENOUS | Status: DC

## 2016-09-23 MED ORDER — SODIUM CHLORIDE 0.9 % IV SOLN
INTRAVENOUS | Status: AC | PRN
  Administered 2016-09-23: 10 mL/h via INTRAVENOUS

## 2016-09-23 MED ORDER — FENTANYL CITRATE (PF) 100 MCG/2ML IJ SOLN
INTRAMUSCULAR | Status: AC | PRN
  Administered 2016-09-23: 50 ug via INTRAVENOUS

## 2016-09-23 MED ORDER — MIDAZOLAM HCL 2 MG/2ML IJ SOLN
INTRAMUSCULAR | Status: AC | PRN
  Administered 2016-09-23: 1 mg via INTRAVENOUS

## 2016-09-23 MED ORDER — LIDOCAINE HCL 1 % IJ SOLN
INTRAMUSCULAR | Status: AC
  Filled 2016-09-23: qty 20

## 2016-09-23 MED ORDER — FENTANYL CITRATE (PF) 100 MCG/2ML IJ SOLN
INTRAMUSCULAR | Status: AC
  Filled 2016-09-23: qty 2

## 2016-09-23 MED ORDER — MIDAZOLAM HCL 2 MG/2ML IJ SOLN
INTRAMUSCULAR | Status: AC
  Filled 2016-09-23: qty 2

## 2016-09-23 NOTE — Sedation Documentation (Addendum)
Patient is resting comfortably. 

## 2016-09-23 NOTE — Sedation Documentation (Signed)
Patient is resting comfortably. 

## 2016-09-23 NOTE — Sedation Documentation (Signed)
Patient denies pain and is resting comfortably.  

## 2016-09-23 NOTE — Procedures (Signed)
R random renal Bx 16 g core times two EBL 0 Comp 0

## 2016-09-23 NOTE — H&P (Signed)
Chief Complaint: proteinuria  Referring Physician:Dr/ Donato Heinz  Supervising Physician: Marybelle Killings  Patient Status: [facility] - Out-pt  HPI: Jeffrey Payne is a 83 y.o. male who has a history of CKD for at least 15 years.  He is followed by Dr. Marval Regal.  He has been found to have protein in his urine recently with hyperkalemia.  He is being followed also by Dr. Barbaraann Faster for elevated protein, possibly from MGUS or lymphoproliferative disorders.  This has been followed for 4 years and remains stable with no treatment or intervention warranted.  Given his current urinary findings though, nephrology has requested a random renal biopsy.  The patient has no complaints and is otherwise feeling well.  Past Medical History:  Past Medical History:  Diagnosis Date  . CKD (chronic kidney disease) stage 3, GFR 30-59 ml/min   . ED (erectile dysfunction)   . GERD (gastroesophageal reflux disease)   . Heart murmur   . Hypertension   . Hypertriglyceridemia   . Rosacea     Past Surgical History: History reviewed. No pertinent surgical history.  Family History:  Family History  Problem Relation Age of Onset  . Cancer Mother     Social History:  reports that he has never smoked. He does not have any smokeless tobacco history on file. He reports that he does not drink alcohol or use drugs.  Allergies: No Known Allergies  Medications: Medications reviewed in epic  Please HPI for pertinent positives, otherwise complete 10 system ROS negative.  Mallampati Score: MD Evaluation Airway: WNL Heart: WNL (few skipped beats) Abdomen: WNL Chest/ Lungs: WNL ASA  Classification: 2 Mallampati/Airway Score: Two  Physical Exam: BP (!) 159/66   Pulse 77   Temp 97.9 F (36.6 C) (Oral)   Resp 18   Ht 5\' 7"  (1.702 m)   Wt 150 lb (68 kg)   SpO2 95%   BMI 23.49 kg/m  Body mass index is 23.49 kg/m. General: pleasant, WD, WN white male who is laying in bed in NAD HEENT: head is  normocephalic, atraumatic.  Sclera are noninjected.  PERRL.  Ears and nose without any masses or lesions.  Mouth is pink and moist Heart: regular, rate, and rhythm, few skipped beats.  Normal s1,s2. No obvious murmurs, gallops, or rubs noted.  Palpable radial pulses bilaterally Lungs: CTAB, no wheezes, rhonchi, or rales noted.  Respiratory effort nonlabored Abd: soft, NT, ND, +BS, no masses, hernias, or organomegaly Psych: A&Ox3 with an appropriate affect.   Labs: Results for orders placed or performed during the hospital encounter of 09/23/16 (from the past 48 hour(s))  APTT upon arrival     Status: Abnormal   Collection Time: 09/23/16  6:10 AM  Result Value Ref Range   aPTT 38 (H) 24 - 36 seconds    Comment:        IF BASELINE aPTT IS ELEVATED, SUGGEST PATIENT RISK ASSESSMENT BE USED TO DETERMINE APPROPRIATE ANTICOAGULANT THERAPY.   CBC upon arrival     Status: None   Collection Time: 09/23/16  6:10 AM  Result Value Ref Range   WBC 9.5 4.0 - 10.5 K/uL   RBC 4.28 4.22 - 5.81 MIL/uL   Hemoglobin 13.0 13.0 - 17.0 g/dL   HCT 39.6 39.0 - 52.0 %   MCV 92.5 78.0 - 100.0 fL   MCH 30.4 26.0 - 34.0 pg   MCHC 32.8 30.0 - 36.0 g/dL   RDW 14.9 11.5 - 15.5 %   Platelets 291 150 -  400 K/uL  Protime-INR upon arrival     Status: None   Collection Time: 09/23/16  6:10 AM  Result Value Ref Range   Prothrombin Time 13.4 11.4 - 15.2 seconds   INR 1.02     Imaging: No results found.  Assessment/Plan 1. Proteinuria in setting of CKD We will plan to pursue a random renal biopsy today. The patient's labs and vitals have been reviewed. Risks and Benefits discussed with the patient including, but not limited to bleeding, infection, damage to adjacent structures or low yield requiring additional tests. All of the patient's questions were answered, patient is agreeable to proceed. Consent signed and in chart.   Thank you for this interesting consult.  I greatly enjoyed meeting Jeffrey Payne  and look forward to participating in their care.  A copy of this report was sent to the requesting provider on this date.  Electronically Signed: Henreitta Cea 09/23/2016, 8:11 AM   I spent a total of  30 Minutes   in face to face in clinical consultation, greater than 50% of which was counseling/coordinating care for proteinuria

## 2016-09-23 NOTE — Discharge Instructions (Addendum)

## 2016-10-06 ENCOUNTER — Encounter (HOSPITAL_COMMUNITY): Payer: Self-pay

## 2017-08-01 ENCOUNTER — Inpatient Hospital Stay: Payer: Medicare Other | Attending: Oncology

## 2017-08-01 DIAGNOSIS — D472 Monoclonal gammopathy: Secondary | ICD-10-CM | POA: Diagnosis present

## 2017-08-01 DIAGNOSIS — M25569 Pain in unspecified knee: Secondary | ICD-10-CM | POA: Diagnosis not present

## 2017-08-01 DIAGNOSIS — N289 Disorder of kidney and ureter, unspecified: Secondary | ICD-10-CM | POA: Insufficient documentation

## 2017-08-01 DIAGNOSIS — Z7982 Long term (current) use of aspirin: Secondary | ICD-10-CM | POA: Insufficient documentation

## 2017-08-01 DIAGNOSIS — Z79899 Other long term (current) drug therapy: Secondary | ICD-10-CM | POA: Diagnosis not present

## 2017-08-01 LAB — CBC WITH DIFFERENTIAL/PLATELET
Basophils Absolute: 0.1 10*3/uL (ref 0.0–0.1)
Basophils Relative: 1 %
EOS ABS: 0.1 10*3/uL (ref 0.0–0.5)
EOS PCT: 2 %
HCT: 37.3 % — ABNORMAL LOW (ref 38.4–49.9)
Hemoglobin: 12.2 g/dL — ABNORMAL LOW (ref 13.0–17.1)
LYMPHS ABS: 1.3 10*3/uL (ref 0.9–3.3)
Lymphocytes Relative: 19 %
MCH: 30.4 pg (ref 27.2–33.4)
MCHC: 32.6 g/dL (ref 32.0–36.0)
MCV: 93.2 fL (ref 79.3–98.0)
MONO ABS: 1.2 10*3/uL — AB (ref 0.1–0.9)
MONOS PCT: 17 %
Neutro Abs: 4.3 10*3/uL (ref 1.5–6.5)
Neutrophils Relative %: 61 %
PLATELETS: 272 10*3/uL (ref 140–400)
RBC: 4 MIL/uL — ABNORMAL LOW (ref 4.20–5.82)
RDW: 13.8 % (ref 11.0–14.6)
WBC: 7 10*3/uL (ref 4.0–10.3)

## 2017-08-01 LAB — COMPREHENSIVE METABOLIC PANEL
ALBUMIN: 3.2 g/dL — AB (ref 3.5–5.0)
ALT: 11 U/L (ref 0–55)
AST: 14 U/L (ref 5–34)
Alkaline Phosphatase: 105 U/L (ref 40–150)
Anion gap: 8 (ref 3–11)
BUN: 41 mg/dL — AB (ref 7–26)
CHLORIDE: 107 mmol/L (ref 98–109)
CO2: 23 mmol/L (ref 22–29)
CREATININE: 2.23 mg/dL — AB (ref 0.70–1.30)
Calcium: 9.9 mg/dL (ref 8.4–10.4)
GFR calc Af Amer: 30 mL/min — ABNORMAL LOW (ref >60)
GFR calc non Af Amer: 26 mL/min — ABNORMAL LOW (ref >60)
GLUCOSE: 99 mg/dL (ref 70–140)
Potassium: 4.6 mmol/L (ref 3.5–5.1)
Sodium: 138 mmol/L (ref 136–145)
Total Bilirubin: 0.4 mg/dL (ref 0.2–1.2)
Total Protein: 6.5 g/dL (ref 6.4–8.3)

## 2017-08-02 ENCOUNTER — Other Ambulatory Visit: Payer: Medicare Other

## 2017-08-02 LAB — KAPPA/LAMBDA LIGHT CHAINS
KAPPA FREE LGHT CHN: 65.7 mg/L — AB (ref 3.3–19.4)
Kappa, lambda light chain ratio: 4.56 — ABNORMAL HIGH (ref 0.26–1.65)
Lambda free light chains: 14.4 mg/L (ref 5.7–26.3)

## 2017-08-04 LAB — MULTIPLE MYELOMA PANEL, SERUM
ALBUMIN/GLOB SERPL: 1.3 (ref 0.7–1.7)
ALPHA 1: 0.3 g/dL (ref 0.0–0.4)
ALPHA2 GLOB SERPL ELPH-MCNC: 0.9 g/dL (ref 0.4–1.0)
Albumin SerPl Elph-Mcnc: 3.4 g/dL (ref 2.9–4.4)
B-Globulin SerPl Elph-Mcnc: 1.1 g/dL (ref 0.7–1.3)
GAMMA GLOB SERPL ELPH-MCNC: 0.4 g/dL (ref 0.4–1.8)
Globulin, Total: 2.7 g/dL (ref 2.2–3.9)
IGG (IMMUNOGLOBIN G), SERUM: 240 mg/dL — AB (ref 700–1600)
IGM (IMMUNOGLOBULIN M), SRM: 917 mg/dL — AB (ref 15–143)
IgA: 47 mg/dL — ABNORMAL LOW (ref 61–437)
M Protein SerPl Elph-Mcnc: 0.5 g/dL — ABNORMAL HIGH
Total Protein ELP: 6.1 g/dL (ref 6.0–8.5)

## 2017-08-09 ENCOUNTER — Telehealth: Payer: Self-pay | Admitting: Oncology

## 2017-08-09 ENCOUNTER — Inpatient Hospital Stay (HOSPITAL_BASED_OUTPATIENT_CLINIC_OR_DEPARTMENT_OTHER): Payer: Medicare Other | Admitting: Oncology

## 2017-08-09 VITALS — BP 140/40 | HR 60 | Temp 97.7°F | Resp 17 | Ht 67.0 in | Wt 146.8 lb

## 2017-08-09 DIAGNOSIS — Z7982 Long term (current) use of aspirin: Secondary | ICD-10-CM

## 2017-08-09 DIAGNOSIS — M25569 Pain in unspecified knee: Secondary | ICD-10-CM | POA: Diagnosis not present

## 2017-08-09 DIAGNOSIS — D472 Monoclonal gammopathy: Secondary | ICD-10-CM | POA: Diagnosis not present

## 2017-08-09 DIAGNOSIS — N289 Disorder of kidney and ureter, unspecified: Secondary | ICD-10-CM

## 2017-08-09 DIAGNOSIS — Z79899 Other long term (current) drug therapy: Secondary | ICD-10-CM

## 2017-08-09 NOTE — Progress Notes (Signed)
Hematology and Oncology Follow Up Visit  Jeffrey Payne [id number] [birth year] 84 y.o. 08/09/2017 10:37 AM   Principle Diagnosis: 84 year old man with IgM MGUS diagnosed in 2013. He presented with an M-spike about 0.4 g/dL. He has an elevated IgM quantitative immunoglobulin is around 1100.   Current therapy: Active and surveillance.   Interim History:  Jeffrey Payne is here for a follow-up.  He reports no major changes in his health.  He continues to ambulate without any difficulties but does report knee pain after exerting himself for a period of time.  He does drive short distances and was able to drive his wife to her colonoscopy appointment.  He continues to feel reasonably well without any changes in his performance status or quality of life.  He denies any pathological fractures or recurrent infections.  He continues to follow with nephrology regarding his chronic renal insufficiency.  He does not report any headaches, blurry vision, syncope or seizures. He does not report any fevers, chills or sweats.  Appetite and weight remain stable.  He does not report any chest pain, palpitation or orthopnea.  He denies any cough, wheezing or hemoptysis.  He does not report any nausea, vomiting, abdominal pain. He is not report any frequency urgency or hesitancy. He does not report any lymphadenopathy or petechiae.  He denies any skin rashes or lesions.  He denies any bone pain  Remaining review of systems is negative.   Medications: I have reviewed the patient's current medications.   Current Outpatient Medications  Medication Sig Dispense Refill  . acetaminophen (TYLENOL) 650 MG CR tablet Take 650 mg by mouth every 8 (eight) hours as needed.    Marland Kitchen amLODipine (NORVASC) 10 MG tablet Take 10 mg by mouth daily.    Marland Kitchen aspirin 81 MG tablet Take 81 mg by mouth daily.    . fish oil-omega-3 fatty acids 1000 MG capsule Take 1,000 mg by mouth 3 (three) times daily.    . fluticasone (FLONASE) 50 MCG/ACT nasal  spray Place 1 spray into the nose daily.     . furosemide (LASIX) 40 MG tablet Take 40 mg by mouth daily.    . Glucosamine-Chondroitin (GLUCOSAMINE CHONDR COMPLEX PO) Take 750 mg by mouth 2 (two) times daily.    Marland Kitchen lisinopril (PRINIVIL,ZESTRIL) 40 MG tablet Take 40 mg by mouth daily.    Marland Kitchen loratadine (CLARITIN) 10 MG tablet Take 10 mg by mouth daily. As needed    . Menthol-Methyl Salicylate (MUSCLE RUB EX) Apply 1 application topically daily as needed.    . metoprolol succinate (TOPROL-XL) 50 MG 24 hr tablet Take 50 mg by mouth daily.  1  . ranitidine (ZANTAC) 150 MG tablet Take 150 mg by mouth 2 (two) times daily.    Marland Kitchen terazosin (HYTRIN) 10 MG capsule Take 10 mg by mouth every morning.      No current facility-administered medications for this visit.      Allergies: No Known Allergies  Past Medical History, Surgical history, Social history, and Family History remained unchanged on review today.  Physical Exam: Blood pressure (!) 140/40, pulse 60, temperature 97.7 F (36.5 C), temperature source Oral, resp. rate 17, height '5\' 7"'$  (1.702 m), weight 146 lb 12.8 oz (66.6 kg), SpO2 97 %.   ECOG: 1 General appearance: Alert, awake gentleman appeared comfortable. Head: Atraumatic without abnormalities. Eyes: No scleral icterus.  Pupils are equal and round reactive to light. Oropharynx: Without thrush or ulcers. Lymph nodes: No lymphadenopathy palpated in the cervical, axillary or  supraclavicular regions. Heart: Regular rate and rhythm without any murmurs or gallops. Lung: Clear to auscultation without any rhonchi, wheezes or dullness to percussion. Abdomin: Soft, nontender without any rebound or guarding. Musculoskeletal: No joint deformity or effusion.   Lab Results: Lab Results  Component Value Date   WBC 7.0 08/01/2017   HGB 12.2 (L) 08/01/2017   HCT 37.3 (L) 08/01/2017   MCV 93.2 08/01/2017   PLT 272 08/01/2017     Chemistry      Component Value Date/Time   NA 138 08/01/2017  1045   NA 139 07/28/2016 1038   K 4.6 08/01/2017 1045   K 4.4 07/28/2016 1038   CL 107 08/01/2017 1045   CL 106 07/25/2012 1256   CO2 23 08/01/2017 1045   CO2 24 07/28/2016 1038   BUN 41 (H) 08/01/2017 1045   BUN 38.3 (H) 07/28/2016 1038   CREATININE 2.23 (H) 08/01/2017 1045   CREATININE 1.9 (H) 07/28/2016 1038      Component Value Date/Time   CALCIUM 9.9 08/01/2017 1045   CALCIUM 9.9 07/28/2016 1038   ALKPHOS 105 08/01/2017 1045   ALKPHOS 124 07/28/2016 1038   AST 14 08/01/2017 1045   AST 15 07/28/2016 1038   ALT 11 08/01/2017 1045   ALT 11 07/28/2016 1038   BILITOT 0.4 08/01/2017 1045   BILITOT 0.47 07/28/2016 1038      Results for Jeffrey Payne (MRN [id number]) as of 08/09/2017 10:21  Ref. Range 07/28/2016 10:38 08/01/2017 10:44  IgM (Immunoglobulin M), Srm Latest Ref Range: 15 - 143 mg/dL  917 (H)  IgM, Qn, Serum Latest Ref Range: 15 - 143 mg/dL 1,080 (H)    Results for Jeffrey Payne (MRN [id number]) as of 08/09/2017 10:21  Ref. Range 07/22/2014 10:00 07/29/2015 10:13 07/28/2016 10:38 08/01/2017 10:44  M Protein SerPl Elph-Mcnc Latest Ref Range: Not Observed g/dL  0.4 (H) 0.4 (H) 0.5 (H)     Impression and Plan:  84 year old man with:  1.  IgM kappa MGUS diagnosed in 2013.  He presented with an isolated IgM monoclonal gammopathy without end organ damage.  The differential diagnosis including MGUS versus lymphoproliferative disorder.  He remains on active surveillance without any symptoms or complaints.  Protein studies obtained on April 2018 were reviewed and compared to his previous scans dating back to 2013.  His M spike remained relatively unchanged without any recent complaints or constitutional symptoms.  The natural course of this disease as well as the differential diagnosis was reviewed again I have recommended active surveillance at this time.  Further investigation is needed at this time however bone marrow biopsy imaging studies may be needed in the future if he  develops any specific symptoms or any abnormal laboratory findings.  2. Renal insufficiency: His creatinine close to baseline and has not changed over the years.  This is unrelated to his laboratory findings or a plasma cell disorder.  3. Follow-up: Will be in 12 months.  15  minutes was spent with the patient face-to-face today.  More than 50% of time was dedicated to patient counseling, education and coordination of his care.   Zola Button, MD 4/16/201910:37 AM

## 2017-08-09 NOTE — Telephone Encounter (Signed)
Appt scheduled AVS/Calendar printed per 4/16 los °

## 2018-08-07 ENCOUNTER — Telehealth: Payer: Self-pay | Admitting: Oncology

## 2018-08-07 NOTE — Telephone Encounter (Signed)
Per sch msg, changed 4/28 appt to telephone visit. Called and spoke with patient. Patient wants all appts cancelled. Cancelled appts. Notified MD to reschedule.

## 2018-08-15 ENCOUNTER — Other Ambulatory Visit: Payer: Medicare Other

## 2018-08-17 ENCOUNTER — Other Ambulatory Visit: Payer: Medicare Other

## 2018-08-22 ENCOUNTER — Ambulatory Visit: Payer: Medicare Other | Admitting: Oncology

## 2018-08-24 ENCOUNTER — Ambulatory Visit: Payer: Medicare Other | Admitting: Oncology

## 2018-11-13 ENCOUNTER — Other Ambulatory Visit: Payer: Medicare Other

## 2018-11-16 ENCOUNTER — Ambulatory Visit: Payer: Medicare Other | Admitting: Oncology

## 2019-05-13 ENCOUNTER — Ambulatory Visit: Payer: Medicare Other | Attending: Internal Medicine

## 2019-05-13 DIAGNOSIS — Z23 Encounter for immunization: Secondary | ICD-10-CM

## 2019-05-13 NOTE — Progress Notes (Signed)
   Covid-19 Vaccination Clinic  Name:  Jeffrey Payne    MRN: [id number] DOB: 10/06/1932  05/13/2019  Jeffrey Payne was observed post Covid-19 immunization for 15 minutes without incidence. He was provided with Vaccine Information Sheet and instruction to access the V-Safe system.   Jeffrey Payne was instructed to call 911 with any severe reactions post vaccine: Marland Kitchen Difficulty breathing  . Swelling of your face and throat  . A fast heartbeat  . A bad rash all over your body  . Dizziness and weakness      Covid-19 Vaccination Clinic  Name:  Jeffrey Payne    MRN: [id number] DOB: 06-12-1932  05/13/2019  Jeffrey Payne was observed post Covid-19 immunization for 15 minutes without incidence. He was provided with Vaccine Information Sheet and instruction to access the V-Safe system.   Jeffrey Payne was instructed to call 911 with any severe reactions post vaccine: Marland Kitchen Difficulty breathing  . Swelling of your face and throat  . A fast heartbeat  . A bad rash all over your body  . Dizziness and weakness

## 2019-06-01 ENCOUNTER — Ambulatory Visit: Payer: Medicare Other | Attending: Internal Medicine

## 2019-06-01 DIAGNOSIS — Z23 Encounter for immunization: Secondary | ICD-10-CM | POA: Insufficient documentation

## 2019-06-01 NOTE — Progress Notes (Signed)
   Covid-19 Vaccination Clinic  Name:  Verland Sprinkle    MRN: [id number] DOB: 1933-02-26  06/01/2019  Mr. Saline was observed post Covid-19 immunization for 15 minutes without incidence. He was provided with Vaccine Information Sheet and instruction to access the V-Safe system.   Mr. Huitron was instructed to call 911 with any severe reactions post vaccine: Marland Kitchen Difficulty breathing  . Swelling of your face and throat  . A fast heartbeat  . A bad rash all over your body  . Dizziness and weakness    Immunizations Administered    Name Date Dose VIS Date Route   Pfizer COVID-19 Vaccine 06/01/2019  1:29 PM 0.3 mL 04/06/2019 Intramuscular   Manufacturer: [place]   Lot: [id number]   [place]: [id number]

## 2019-10-05 ENCOUNTER — Inpatient Hospital Stay (HOSPITAL_COMMUNITY)
Admission: AD | Admit: 2019-10-05 | Discharge: 2019-10-09 | DRG: 684 | Disposition: A | Discharge status: Home / Self Care | Payer: Medicare Other | Attending: Family Medicine | Admitting: Family Medicine

## 2019-10-05 ENCOUNTER — Encounter (HOSPITAL_COMMUNITY): Payer: Self-pay | Admitting: *Deleted

## 2019-10-05 ENCOUNTER — Other Ambulatory Visit: Payer: Self-pay

## 2019-10-05 ENCOUNTER — Emergency Department (HOSPITAL_COMMUNITY): Payer: Medicare Other

## 2019-10-05 DIAGNOSIS — K219 Gastro-esophageal reflux disease without esophagitis: Secondary | ICD-10-CM | POA: Diagnosis present

## 2019-10-05 DIAGNOSIS — D472 Monoclonal gammopathy: Secondary | ICD-10-CM | POA: Diagnosis present

## 2019-10-05 DIAGNOSIS — E781 Pure hyperglyceridemia: Secondary | ICD-10-CM | POA: Diagnosis present

## 2019-10-05 DIAGNOSIS — Z79899 Other long term (current) drug therapy: Secondary | ICD-10-CM

## 2019-10-05 DIAGNOSIS — R001 Bradycardia, unspecified: Secondary | ICD-10-CM | POA: Diagnosis present

## 2019-10-05 DIAGNOSIS — Z20822 Contact with and (suspected) exposure to covid-19: Secondary | ICD-10-CM | POA: Diagnosis present

## 2019-10-05 DIAGNOSIS — I952 Hypotension due to drugs: Secondary | ICD-10-CM | POA: Diagnosis not present

## 2019-10-05 DIAGNOSIS — N189 Chronic kidney disease, unspecified: Secondary | ICD-10-CM | POA: Diagnosis not present

## 2019-10-05 DIAGNOSIS — N179 Acute kidney failure, unspecified: Secondary | ICD-10-CM | POA: Diagnosis present

## 2019-10-05 DIAGNOSIS — F329 Major depressive disorder, single episode, unspecified: Secondary | ICD-10-CM | POA: Diagnosis not present

## 2019-10-05 DIAGNOSIS — I351 Nonrheumatic aortic (valve) insufficiency: Secondary | ICD-10-CM | POA: Diagnosis not present

## 2019-10-05 DIAGNOSIS — I440 Atrioventricular block, first degree: Secondary | ICD-10-CM | POA: Diagnosis present

## 2019-10-05 DIAGNOSIS — R011 Cardiac murmur, unspecified: Secondary | ICD-10-CM

## 2019-10-05 DIAGNOSIS — D631 Anemia in chronic kidney disease: Secondary | ICD-10-CM | POA: Diagnosis present

## 2019-10-05 DIAGNOSIS — I1310 Hypertensive heart and chronic kidney disease without heart failure, with stage 1 through stage 4 chronic kidney disease, or unspecified chronic kidney disease: Secondary | ICD-10-CM | POA: Diagnosis present

## 2019-10-05 DIAGNOSIS — R809 Proteinuria, unspecified: Secondary | ICD-10-CM | POA: Diagnosis present

## 2019-10-05 DIAGNOSIS — C880 Waldenstrom macroglobulinemia: Secondary | ICD-10-CM | POA: Diagnosis present

## 2019-10-05 DIAGNOSIS — N401 Enlarged prostate with lower urinary tract symptoms: Secondary | ICD-10-CM | POA: Diagnosis present

## 2019-10-05 DIAGNOSIS — N184 Chronic kidney disease, stage 4 (severe): Secondary | ICD-10-CM | POA: Diagnosis present

## 2019-10-05 DIAGNOSIS — R338 Other retention of urine: Secondary | ICD-10-CM | POA: Diagnosis present

## 2019-10-05 DIAGNOSIS — R5383 Other fatigue: Secondary | ICD-10-CM | POA: Diagnosis present

## 2019-10-05 DIAGNOSIS — J181 Lobar pneumonia, unspecified organism: Secondary | ICD-10-CM

## 2019-10-05 DIAGNOSIS — E860 Dehydration: Secondary | ICD-10-CM | POA: Diagnosis present

## 2019-10-05 DIAGNOSIS — I959 Hypotension, unspecified: Secondary | ICD-10-CM | POA: Diagnosis present

## 2019-10-05 DIAGNOSIS — N281 Cyst of kidney, acquired: Secondary | ICD-10-CM | POA: Diagnosis present

## 2019-10-05 DIAGNOSIS — L719 Rosacea, unspecified: Secondary | ICD-10-CM | POA: Diagnosis present

## 2019-10-05 DIAGNOSIS — I130 Hypertensive heart and chronic kidney disease with heart failure and stage 1 through stage 4 chronic kidney disease, or unspecified chronic kidney disease: Secondary | ICD-10-CM

## 2019-10-05 DIAGNOSIS — I10 Essential (primary) hypertension: Secondary | ICD-10-CM | POA: Diagnosis not present

## 2019-10-05 DIAGNOSIS — D729 Disorder of white blood cells, unspecified: Secondary | ICD-10-CM

## 2019-10-05 LAB — CBC WITH DIFFERENTIAL/PLATELET
Abs Immature Granulocytes: 0.06 10*3/uL (ref 0.00–0.07)
Basophils Absolute: 0.1 10*3/uL (ref 0.0–0.1)
Basophils Relative: 1 %
Eosinophils Absolute: 1 10*3/uL — ABNORMAL HIGH (ref 0.0–0.5)
Eosinophils Relative: 10 %
HCT: 37.2 % — ABNORMAL LOW (ref 39.0–52.0)
Hemoglobin: 12 g/dL — ABNORMAL LOW (ref 13.0–17.0)
Immature Granulocytes: 1 %
Lymphocytes Relative: 21 %
Lymphs Abs: 2.1 10*3/uL (ref 0.7–4.0)
MCH: 30.7 pg (ref 26.0–34.0)
MCHC: 32.3 g/dL (ref 30.0–36.0)
MCV: 95.1 fL (ref 80.0–100.0)
Monocytes Absolute: 1.6 10*3/uL — ABNORMAL HIGH (ref 0.1–1.0)
Monocytes Relative: 16 %
Neutro Abs: 5.1 10*3/uL (ref 1.7–7.7)
Neutrophils Relative %: 51 %
Platelets: 280 10*3/uL (ref 150–400)
RBC: 3.91 MIL/uL — ABNORMAL LOW (ref 4.22–5.81)
RDW: 13.8 % (ref 11.5–15.5)
WBC: 9.9 10*3/uL (ref 4.0–10.5)
nRBC: 0 % (ref 0.0–0.2)

## 2019-10-05 LAB — COMPREHENSIVE METABOLIC PANEL
ALT: 14 U/L (ref 0–44)
AST: 13 U/L — ABNORMAL LOW (ref 15–41)
Albumin: 3.3 g/dL — ABNORMAL LOW (ref 3.5–5.0)
Alkaline Phosphatase: 78 U/L (ref 38–126)
Anion gap: 11 (ref 5–15)
BUN: 76 mg/dL — ABNORMAL HIGH (ref 8–23)
CO2: 23 mmol/L (ref 22–32)
Calcium: 9.5 mg/dL (ref 8.9–10.3)
Chloride: 100 mmol/L (ref 98–111)
Creatinine, Ser: 3.56 mg/dL — ABNORMAL HIGH (ref 0.61–1.24)
GFR calc Af Amer: 17 mL/min — ABNORMAL LOW (ref >60)
GFR calc non Af Amer: 15 mL/min — ABNORMAL LOW (ref >60)
Glucose, Bld: 165 mg/dL — ABNORMAL HIGH (ref 70–99)
Potassium: 4.9 mmol/L (ref 3.5–5.1)
Sodium: 134 mmol/L — ABNORMAL LOW (ref 135–145)
Total Bilirubin: 0.5 mg/dL (ref 0.3–1.2)
Total Protein: 6.4 g/dL — ABNORMAL LOW (ref 6.5–8.1)

## 2019-10-05 LAB — CBC
HCT: 37.7 % — ABNORMAL LOW (ref 39.0–52.0)
Hemoglobin: 12 g/dL — ABNORMAL LOW (ref 13.0–17.0)
MCH: 30.8 pg (ref 26.0–34.0)
MCHC: 31.8 g/dL (ref 30.0–36.0)
MCV: 96.7 fL (ref 80.0–100.0)
Platelets: 295 10*3/uL (ref 150–400)
RBC: 3.9 MIL/uL — ABNORMAL LOW (ref 4.22–5.81)
RDW: 13.8 % (ref 11.5–15.5)
WBC: 9.9 10*3/uL (ref 4.0–10.5)
nRBC: 0 % (ref 0.0–0.2)

## 2019-10-05 LAB — URINALYSIS, ROUTINE W REFLEX MICROSCOPIC
Bilirubin Urine: NEGATIVE
Glucose, UA: NEGATIVE mg/dL
Hgb urine dipstick: NEGATIVE
Ketones, ur: NEGATIVE mg/dL
Leukocytes,Ua: NEGATIVE
Nitrite: NEGATIVE
Protein, ur: NEGATIVE mg/dL
Specific Gravity, Urine: 1.005 (ref 1.005–1.030)
pH: 7 (ref 5.0–8.0)

## 2019-10-05 LAB — CREATININE, SERUM
Creatinine, Ser: 3.42 mg/dL — ABNORMAL HIGH (ref 0.61–1.24)
GFR calc Af Amer: 18 mL/min — ABNORMAL LOW (ref >60)
GFR calc non Af Amer: 16 mL/min — ABNORMAL LOW (ref >60)

## 2019-10-05 LAB — TSH: TSH: 1.876 u[IU]/mL (ref 0.350–4.500)

## 2019-10-05 LAB — SARS CORONAVIRUS 2 BY RT PCR (HOSPITAL ORDER, PERFORMED IN ~~LOC~~ HOSPITAL LAB): SARS Coronavirus 2: NEGATIVE

## 2019-10-05 LAB — TROPONIN I (HIGH SENSITIVITY)
Troponin I (High Sensitivity): 11 ng/L (ref <18)
Troponin I (High Sensitivity): 10 ng/L (ref <18)
Troponin I (High Sensitivity): 12 ng/L (ref <18)

## 2019-10-05 LAB — BRAIN NATRIURETIC PEPTIDE: B Natriuretic Peptide: 610.4 pg/mL — ABNORMAL HIGH (ref 0.0–100.0)

## 2019-10-05 MED ORDER — SODIUM CHLORIDE 0.9 % IV SOLN: INTRAVENOUS | Status: DC

## 2019-10-05 MED ORDER — ONDANSETRON HCL 4 MG/2ML IJ SOLN
4.0000 mg | Freq: Four times a day (QID) | INTRAMUSCULAR | Status: DC | PRN
  Administered 2019-10-05: 4 mg via INTRAVENOUS
  Filled 2019-10-05: qty 2

## 2019-10-05 MED ORDER — HEPARIN SODIUM (PORCINE) 5000 UNIT/ML IJ SOLN
5000.0000 [IU] | Freq: Three times a day (TID) | INTRAMUSCULAR | Status: DC
  Administered 2019-10-05 – 2019-10-09 (×11): 5000 [IU] via SUBCUTANEOUS
  Filled 2019-10-05 (×11): qty 1

## 2019-10-05 MED ORDER — ASPIRIN EC 81 MG PO TBEC
81.0000 mg | DELAYED_RELEASE_TABLET | Freq: Every day | ORAL | Status: DC
  Administered 2019-10-05 – 2019-10-09 (×5): 81 mg via ORAL
  Filled 2019-10-05 (×5): qty 1

## 2019-10-05 MED ORDER — ACETAMINOPHEN 650 MG RE SUPP: 650.0000 mg | Freq: Four times a day (QID) | RECTAL | Status: DC | PRN

## 2019-10-05 MED ORDER — ONDANSETRON HCL 4 MG PO TABS: 4.0000 mg | ORAL_TABLET | Freq: Four times a day (QID) | ORAL | Status: DC | PRN

## 2019-10-05 MED ORDER — ACETAMINOPHEN 325 MG PO TABS
650.0000 mg | ORAL_TABLET | Freq: Four times a day (QID) | ORAL | Status: DC | PRN
  Administered 2019-10-05: 650 mg via ORAL
  Filled 2019-10-05: qty 2

## 2019-10-05 MED ORDER — SODIUM CHLORIDE 0.9 % IV BOLUS
500.0000 mL | Freq: Once | INTRAVENOUS | Status: AC
  Administered 2019-10-05: 500 mL via INTRAVENOUS

## 2019-10-05 NOTE — H&P (Signed)
[facility] History and Physical  Jeffrey Payne XNT:[id number] DOB: Jan 17, 1933 DOA: 10/05/2019  Referring physician:  PCP: Gavin Pound, MD   Chief Complaint: Fatigue/Low energy  HPI: Jeffrey Payne is a 86 y.o. WM PMHx HTN, CKD stage IV (baseline Cr 2.23), heart murmur,, hypertriglyceridemia  Presented to emergency department with complaint of fatigue and low energy.  Patient ports been ongoing for several weeks.  He feels like he has progressively been more more tired.  He says he has very low energy.  He says is been having difficulty even getting out of bed or getting around the house.  He specifically denies that he feels lightheaded or his legs feel weak, but simply feels he does not have the energy to do anything.  He has been able to eat normally.  He denies nausea vomiting or diarrhea.  He denies any chest pain or pressure.  He denies shortness of breath.  He says has been sleeping well.  He denies fevers or chills.  He denies leg swelling.  He denies any history of congestive heart failure, MI, cardiac stents.  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  He does report to receive both Covid vaccines in the past.  He lives with his wife is been helping him out at home the past several days.  He denies any history of GI bleed or anemia that he is aware of.  He is not on any blood thinners.  His nephrologist is Dr. Monika Salk  His PCP is with [facility]- he states he was seen recently but cannot tell me what kind of workup was ordered     Review of Systems:  Covid vaccination; positive vaccination  Constitutional:  No weight loss, night sweats, Fevers, chills, fatigue.  HEENT:  No headaches, Difficulty swallowing,Tooth/dental problems,Sore throat,  No sneezing, itching, ear ache, nasal congestion,  post nasal drip,  Cardio-vascular:  No chest pain, Orthopnea, PND, swelling in lower extremities, anasarca, dizziness, palpitations  GI:  No heartburn, indigestion, abdominal pain, nausea, vomiting, diarrhea, change in bowel habits, loss of appetite  Resp:  No shortness of breath with exertion or at rest. No excess mucus, no productive cough, No non-productive cough, No coughing up of blood.No change in color of mucus.No wheezing.No chest wall deformity  Skin:  no rash or lesions.  GU:  no dysuria, change in color of urine, no urgency or frequency. No flank pain.  Musculoskeletal:  No joint pain or swelling. No decreased range of motion. No back pain.  Psych:  Positive change in mood or affect. Positivedepression or anxiety. No memory loss.   Past Medical History:  Diagnosis Date  . CKD (chronic kidney disease) stage 3, GFR 30-59 ml/min   . ED (erectile dysfunction)   . GERD (gastroesophageal reflux disease)   . Heart murmur   . Hypertension   . Hypertriglyceridemia   . Rosacea    History reviewed. No pertinent surgical history. Social History:  reports that he has never smoked. He does not have any smokeless tobacco history on file. He reports current alcohol use. He reports that he does not use drugs.  No Known Allergies  Family History  Problem Relation Age of Onset  . Cancer Mother     Prior to Admission medications   Medication Sig Start Date End Date Taking? Authorizing Provider  acetaminophen (TYLENOL) 650 MG CR tablet Take 650  mg by mouth every 8 (eight) hours as needed for pain.    Yes [provider]  amLODipine (NORVASC) 10 MG tablet Take 10 mg by mouth daily.   Yes [provider]  aspirin 81 MG tablet Take 81 mg by mouth every evening.    Yes [provider]  calcitRIOL (ROCALTROL) 0.25 MCG capsule Take 0.25 mcg by mouth daily.  08/20/19  Yes [provider]  cetirizine (ZYRTEC) 10 MG tablet Take 10 mg by mouth daily.   Yes  [provider]  diclofenac Sodium (VOLTAREN) 1 % GEL Apply 2 g topically 4 (four) times daily as needed (pain).   Yes [provider]  fish oil-omega-3 fatty acids 1000 MG capsule Take 1,000 mg by mouth daily.    Yes [provider]  fluticasone (FLONASE) 50 MCG/ACT nasal spray Place 1 spray into the nose daily as needed for allergies.    Yes [provider]  furosemide (LASIX) 40 MG tablet Take 80 mg by mouth daily.    Yes [provider]  Glucosamine-Chondroitin (GLUCOSAMINE CHONDR COMPLEX PO) Take 750 mg by mouth 2 (two) times daily.   Yes [provider]  hydrALAZINE (APRESOLINE) 25 MG tablet Take 25 mg by mouth in the morning and at bedtime.  08/15/19  Yes [provider]  metoprolol succinate (TOPROL-XL) 50 MG 24 hr tablet Take 50 mg by mouth every evening.  05/27/14  Yes [provider]  olmesartan (BENICAR) 40 MG tablet Take 40 mg by mouth every morning. 07/17/19  Yes [provider]  terazosin (HYTRIN) 10 MG capsule Take 10 mg by mouth every morning.    Yes [provider]  Menthol-Methyl Salicylate (MUSCLE RUB EX) Apply 1 application topically daily as needed. Patient not taking: Reported on 10/05/2019    [provider]  ranitidine (ZANTAC) 150 MG tablet Take 150 mg by mouth 2 (two) times daily. Patient not taking: Reported on 10/05/2019    [provider]     Consultants:    Procedures/Significant Events:    I have personally reviewed and interpreted all radiology studies and my findings are as above.   VENTILATOR SETTINGS:    Cultures 6/11 SARS coronavirus negative  Antimicrobials:    Devices    LINES / TUBES:      Continuous Infusions:  Physical Exam: Vitals:   10/05/19 1430 10/05/19 1530 10/05/19 1700 10/05/19 1720  BP: (!) 136/44 (!) 132/51 (!) 119/50 (!) 145/59  Pulse: (!) 57 (!) 57 (!) 59 72  Resp: 15 14 16  (!) 21  Temp:      SpO2: 95% 93% 90%  91%  Weight:      Height:        Wt Readings from Last 3 Encounters:  10/05/19 68 kg  08/09/17 66.6 kg  09/23/16 68 kg    General: A/O x4, no acute respiratory distress Eyes: negative scleral hemorrhage, negative anisocoria, negative icterus ENT: Negative Runny nose, negative gingival bleeding, Neck:  Negative scars, masses, torticollis, lymphadenopathy, JVD Lungs: Clear to auscultation bilaterally without wheezes or crackles Cardiovascular: Regular rate and rhythm without murmur gallop or rub normal S1 and S2 Abdomen: negative abdominal pain, nondistended, positive soft, bowel sounds, no rebound, no ascites, no appreciable mass Extremities: No significant cyanosis, clubbing, or edema bilateral lower extremities Skin: Negative rashes, lesions, ulcers Psychiatric:  Positivedepression, negative anxiety, Positive fatigue, negative mania  Central nervous system:  Cranial nerves II through XII intact, tongue/uvula midline, all extremities muscle strength 5/5,  sensation intact throughout, negative dysarthria, negative expressive aphasia, negative receptive aphasia.        Labs on Admission:  Basic Metabolic Panel: Recent Labs  Lab 10/05/19 1349  NA 134*  K 4.9  CL 100  CO2 23  GLUCOSE 165*  BUN 76*  CREATININE 3.56*  CALCIUM 9.5   Liver Function Tests: Recent Labs  Lab 10/05/19 1349  AST 13*  ALT 14  ALKPHOS 78  BILITOT 0.5  PROT 6.4*  ALBUMIN 3.3*   No results for input(s): LIPASE, AMYLASE in the last 168 hours. No results for input(s): AMMONIA in the last 168 hours. CBC: Recent Labs  Lab 10/05/19 1349  WBC 9.9  NEUTROABS 5.1  HGB 12.0*  HCT 37.2*  MCV 95.1  PLT 280   Cardiac Enzymes: No results for input(s): CKTOTAL, CKMB, CKMBINDEX, TROPONINI in the last 168 hours.  BNP (last 3 results) Recent Labs    10/05/19 1349  BNP 610.4*    ProBNP (last 3 results) No results for input(s): PROBNP in the last 8760 hours.  CBG: No results for input(s):  GLUCAP in the last 168 hours.  Radiological Exams on Admission: DG Chest 2 View  Result Date: 10/05/2019 CLINICAL DATA:  Fatigue and shortness of breath EXAM: CHEST - 2 VIEW COMPARISON:  06/16/2013 FINDINGS: Cardiac shadow is enlarged. Aortic calcifications are again seen. The lungs are well aerated bilaterally. Calcified granuloma is again seen in the left upper lobe. Mild opacity is noted in the right lung base projecting in the lower lobe consistent with early infiltrate. No bony abnormality is noted. IMPRESSION: Early right basilar infiltrate. Electronically Signed   By: Inez Catalina M.D.   On: 10/05/2019 15:31    EKG: Independently reviewed.   Assessment/Plan Active Problems:   Hypertension   Heart murmur   CKD (chronic kidney disease), stage IV (HCC)   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HCC)   Bradycardia   Hypotension   Bradycardia/relative Hypotension -Hold all BP medication -Daily orthostatic vitals -Strict in and out -Daily weight -Echocardiogram pending  CKD stage IV -Hold all nephrotoxic medication -Gentle hydration normal saline 50 ml/hr  WaldenStrom's macroglobulinemia -All of patient's blood lines appear WNL -May consider discussing with patient's oncologist Dr Alen Blew.   Code Status: Full (DVT Prophylaxis: Heparin subcu Family Communication:   Status is: Inpatient    Dispo: The patient is from: Home              Anticipated d/c is to: Home              Anticipated d/c date is: 6/13              Patient currently unstable     Data Reviewed: Care during the described time interval was provided by me .  I have reviewed this patient's available data, including medical history, events of note, physical examination, and all test results as part of my evaluation.   The patient is critically ill with multiple organ systems failure and requires high complexity decision making for assessment and support, frequent evaluation and titration of therapies,  application of advanced monitoring technologies and extensive interpretation of multiple databases. Critical Care Time devoted to patient care services described in this note  Time spent: 58 minutes   Darla Mcdonald, [facility] Pager [phone]

## 2019-10-05 NOTE — ED Triage Notes (Signed)
86 yo male from home with feeling lethargic and tired for the past 3 days. No other complaints per EMS.   Vitals EN Route: 152/72 HR 74 Sat 96RA CBG=272  Hx: Chronic Kidney disease HTN

## 2019-10-05 NOTE — ED Provider Notes (Signed)
Jeffrey Payne DEPT Provider Note   CSN: [id number] Arrival date & time: 10/05/19  1317     History Chief Complaint  Patient presents with  . Fatigue    Jeffrey Payne is a 86 y.o. male history chronic kidney disease, heart murmur, hypertension, presented to emergency department with complaint of fatigue and low energy.  Patient ports been ongoing for several weeks.  He feels like he has progressively been more more tired.  He says he has very low energy.  He says is been having difficulty even getting out of bed or getting around the house.  He specifically denies that he feels lightheaded or his legs feel weak, but simply feels he does not have the energy to do anything.  He has been able to eat normally.  He denies nausea vomiting or diarrhea.  He denies any chest pain or pressure.  He denies shortness of breath.  He says has been sleeping well.  He denies fevers or chills.  He denies leg swelling.  He denies any history of congestive heart failure, MI, cardiac stents.  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  He does report to receive both Covid vaccines in the past.  He lives with his wife is been helping him out at home the past several days.  He denies any history of GI bleed or anemia that he is aware of.  He is not on any blood thinners.  His nephrologist is Dr. Monika Salk  His PCP is with [facility]- he states he was seen recently but cannot tell me what kind of workup was ordered  HPI     Past Medical History:  Diagnosis Date  . CKD (chronic kidney disease) stage 3, GFR 30-59 ml/min   . ED (erectile dysfunction)   . GERD (gastroesophageal reflux disease)   . Heart murmur   . Hypertension   . Hypertriglyceridemia   . Rosacea     Patient Active Problem List   Diagnosis Date  Noted  . CKD (chronic kidney disease), stage IV ([place]) 10/05/2019  . 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[place]) 10/05/2019  . Bradycardia 10/05/2019  . Hypotension 10/05/2019  . Fatigue 10/05/2019  . Hypertension   . Heart murmur     History reviewed. No pertinent surgical history.     Family History  Problem Relation Age of Onset  . Cancer Mother     Social History   Tobacco Use  . Smoking status: Never Smoker  Vaping Use  . Vaping Use: Never used  Substance Use Topics  . Alcohol use: Yes    Comment: 2 cocktails per day  . Drug use: No    Home Medications Prior to Admission medications   Medication Sig Start Date End Date Taking? Authorizing Provider  acetaminophen (TYLENOL) 650 MG CR tablet Take 650 mg by mouth every 8 (eight) hours as needed for pain.    Yes [provider]  amLODipine (NORVASC) 10 MG tablet Take 10 mg by mouth daily.   Yes [provider]  aspirin 81 MG tablet Take 81 mg by mouth every evening.    Yes [provider]  calcitRIOL (ROCALTROL) 0.25 MCG capsule Take 0.25 mcg by mouth daily.  08/20/19  Yes [provider]  cetirizine (ZYRTEC) 10 MG tablet Take 10 mg by mouth daily.   Yes [provider]  diclofenac Sodium (VOLTAREN)  1 % GEL Apply 2 g topically 4 (four) times daily as needed (pain).   Yes [provider]  fish oil-omega-3 fatty acids 1000 MG capsule Take 1,000 mg by mouth daily.    Yes [provider]  fluticasone (FLONASE) 50 MCG/ACT nasal spray Place 1 spray into the nose daily as needed for allergies.    Yes [provider]  furosemide (LASIX) 40 MG tablet Take 80 mg by mouth daily.    Yes [provider]  Glucosamine-Chondroitin (GLUCOSAMINE CHONDR COMPLEX PO) Take 750 mg by mouth 2 (two) times daily.   Yes [provider]  hydrALAZINE (APRESOLINE) 25 MG tablet Take 25 mg by mouth in the morning and at bedtime.  08/15/19  Yes [provider]    metoprolol succinate (TOPROL-XL) 50 MG 24 hr tablet Take 50 mg by mouth every evening.  05/27/14  Yes [provider]  olmesartan (BENICAR) 40 MG tablet Take 40 mg by mouth every morning. 07/17/19  Yes [provider]  terazosin (HYTRIN) 10 MG capsule Take 10 mg by mouth every morning.    Yes [provider]  Menthol-Methyl Salicylate (MUSCLE RUB EX) Apply 1 application topically daily as needed. Patient not taking: Reported on 10/05/2019    [provider]  ranitidine (ZANTAC) 150 MG tablet Take 150 mg by mouth 2 (two) times daily. Patient not taking: Reported on 10/05/2019    [provider]    Allergies    Patient has no known allergies.  Review of Systems   Review of Systems  Constitutional: Positive for appetite change and fatigue. Negative for chills and fever.  HENT: Negative for ear pain and sore throat.   Eyes: Negative for pain and visual disturbance.  Respiratory: Negative for cough and shortness of breath.   Cardiovascular: Negative for chest pain and palpitations.  Gastrointestinal: Negative for abdominal pain, nausea and vomiting.  Genitourinary: Negative for dysuria and hematuria.  Musculoskeletal: Negative for arthralgias and back pain.  Skin: Negative for color change and rash.  Neurological: Negative for syncope and light-headedness.  All other systems reviewed and are negative.   Physical Exam Updated Vital Signs BP (!) 161/56 (BP Location: Right Arm)   Pulse 80   Temp 98.1 F (36.7 C) (Oral)   Resp 20   Ht 5\' 7"  (1.702 m)   Wt 60.7 kg   SpO2 95%   BMI 20.96 kg/m   Physical Exam Vitals and nursing note reviewed.  Constitutional:      Appearance: He is well-developed.  HENT:     Head: Normocephalic and atraumatic.  Eyes:     Conjunctiva/sclera: Conjunctivae normal.  Cardiovascular:     Rate and Rhythm: Normal rate and regular rhythm.     Pulses: Normal pulses.     Heart sounds: Murmur heard.   Pulmonary:      Effort: Pulmonary effort is normal. No respiratory distress.     Breath sounds: Normal breath sounds.  Abdominal:     General: There is no distension.     Palpations: Abdomen is soft.     Tenderness: There is no abdominal tenderness.  Musculoskeletal:     Cervical back: Neck supple.  Skin:    General: Skin is warm and dry.  Neurological:     General: No focal deficit present.     Mental Status: He is alert and oriented to person, place, and time.     Sensory: No sensory deficit.     Motor: No weakness.  Psychiatric:        Mood and Affect: Mood normal.        Behavior: Behavior normal.     ED Results / Procedures / Treatments   Labs (all labs ordered are listed, but only abnormal results are displayed) Labs Reviewed  COMPREHENSIVE METABOLIC PANEL - Abnormal; Notable for the following components:      Result Value   Sodium 134 (*)    Glucose, Bld 165 (*)    BUN 76 (*)    Creatinine, Ser 3.56 (*)    Total Protein 6.4 (*)    Albumin 3.3 (*)    AST 13 (*)    GFR calc non Af Amer 15 (*)    GFR calc Af Amer 17 (*)    All other components within normal limits  CBC WITH DIFFERENTIAL/PLATELET - Abnormal; Notable for the following components:   RBC 3.91 (*)    Hemoglobin 12.0 (*)    HCT 37.2 (*)    Monocytes Absolute 1.6 (*)    Eosinophils Absolute 1.0 (*)    All other components within normal limits  BRAIN NATRIURETIC PEPTIDE - Abnormal; Notable for the following components:   B Natriuretic Peptide 610.4 (*)    All other components within normal limits  URINALYSIS, ROUTINE W REFLEX MICROSCOPIC - Abnormal; Notable for the following components:   Color, Urine STRAW (*)    All other components within normal limits  CBC - Abnormal; Notable for the following components:   RBC 3.90 (*)    Hemoglobin 12.0 (*)    HCT 37.7 (*)    All other components within normal limits  CREATININE, SERUM - Abnormal; Notable for the following components:   Creatinine, Ser 3.42 (*)    GFR  calc non Af Amer 16 (*)    GFR calc Af Amer 18 (*)    All other components within normal limits  SARS CORONAVIRUS 2 BY RT PCR (HOSPITAL ORDER, [facility])  URINE CULTURE  TSH  COMPREHENSIVE METABOLIC PANEL  MAGNESIUM  PHOSPHORUS  CBC WITH DIFFERENTIAL/PLATELET  TROPONIN I (HIGH SENSITIVITY)  TROPONIN I (HIGH SENSITIVITY)  TROPONIN I (HIGH SENSITIVITY)    EKG EKG Interpretation  Date/Time:  Friday October 05 2019 14:00:22 EDT Ventricular Rate:  60 PR Interval:  222 QRS Duration: 70 QT Interval:  418 QTC Calculation: 418 R Axis:   -130 Text Interpretation: Sinus rhythm with 1st degree A-V block Right superior axis deviation Pulmonary disease pattern Abnormal ECG No STEMI Confirmed by Octaviano Glow [phone]) on 10/05/2019 2:05:10 PM   Radiology DG Chest 2 View  Result Date: 10/05/2019 CLINICAL DATA:  Fatigue and shortness of breath EXAM: CHEST - 2 VIEW COMPARISON:  06/16/2013 FINDINGS: Cardiac shadow is enlarged. Aortic calcifications are again seen. The lungs are well aerated bilaterally. Calcified granuloma is again seen in the left upper lobe. Mild opacity is noted in the right lung base projecting in the lower lobe consistent with early infiltrate. No bony abnormality is noted. IMPRESSION: Early right basilar infiltrate. Electronically Signed   By: Inez Catalina M.D.   On: 10/05/2019 15:31    Procedures Procedures (including critical care time)  Medications Ordered in ED Medications  0.9 %  sodium chloride infusion ( Intravenous New Bag/Given 10/05/19 1849)  heparin injection 5,000 Units (5,000 Units Subcutaneous Given 10/05/19 2314)  acetaminophen (TYLENOL) tablet 650 mg (650 mg Oral Given 10/05/19 2355)    Or  acetaminophen (TYLENOL) suppository 650 mg ( Rectal See Alternative 10/05/19  2355)  aspirin EC tablet 81 mg (81 mg Oral Given 10/05/19 2314)  ondansetron (ZOFRAN) tablet 4 mg ( Oral See Alternative 10/05/19 2355)    Or  ondansetron (ZOFRAN)  injection 4 mg (4 mg Intravenous Given 10/05/19 2355)  sodium chloride 0.9 % bolus 500 mL (0 mLs Intravenous Stopped 10/05/19 1847)    ED Course  I have reviewed the triage vital signs and the nursing notes.  Pertinent labs & imaging results that were available during my care of the patient were reviewed by me and considered in my medical decision making (see chart for details).  86 yo male presenting to Ed with generalized weakness and fatigue for several weeks.  Low energy.    Differential is broad and includes anemia versus atypical ACS versus new onset congestive heart failure versus waldenstrom's (under active surveillence with oncology here) versus UTI vs other  Less likely viral illness, received covid vaccines  Labs ordered and personally reviewed including BMP, CBC with diff, UA, Trop, BNP, noteable for Cr 3.4. BNP 610, flat troponins. ECG shows per my interpretation NSR IV fluids given for weakness, 500 cc fluid bolus DG chest ordered and per my interpretation shows possible RLL infiltrate  Medical chart reviewed as noted above including oncology office visits in the past   Clinical Course as of Oct 05 37  Fri Oct 05, 2019  1512 With elevated BNP and worsening kidney function, will admit for possible cardiorenal syndrome.  I suspect he may be a little volume depleted and gave 500 cc IVF as his elevated BUN to Cr ratio suggest a prerenal syndrome.  He may benefit from an echocardiogram as an inpatient and discussion with nephrology regarding diuretic regimen   [MT]  1524 No evidence of significant RBC or platelet dysfunction to suggest Waldenstrom's or hyperviscosity syndrome or crisis at this time, however this may also remain on the differential   [MT]  1542 IMPRESSION: Early right basilar infiltrate.    [MT]  2536 Likely some aspiration, no fever or leukocytosis here, will hold on antibiotics for now   [MT]  1547 Admitted to dr Sherral Hammers hospitalist   [MT]    Clinical  Course User Index [MT] Wyvonnia Dusky, MD    Final Clinical Impression(s) / ED Diagnoses Final diagnoses:  Cardiorenal syndrome with renal failure, stage 1-4 or unspecified chronic kidney disease, with heart failure ([place])  Other fatigue    Rx / DC Orders ED Discharge Orders    None       Wyvonnia Dusky, MD 10/06/19 [phone]

## 2019-10-06 ENCOUNTER — Inpatient Hospital Stay (HOSPITAL_COMMUNITY): Payer: Medicare Other

## 2019-10-06 ENCOUNTER — Encounter (HOSPITAL_COMMUNITY): Payer: Self-pay | Admitting: Internal Medicine

## 2019-10-06 DIAGNOSIS — I351 Nonrheumatic aortic (valve) insufficiency: Secondary | ICD-10-CM

## 2019-10-06 DIAGNOSIS — N189 Chronic kidney disease, unspecified: Secondary | ICD-10-CM | POA: Diagnosis present

## 2019-10-06 DIAGNOSIS — N179 Acute kidney failure, unspecified: Secondary | ICD-10-CM | POA: Diagnosis present

## 2019-10-06 DIAGNOSIS — D472 Monoclonal gammopathy: Secondary | ICD-10-CM

## 2019-10-06 DIAGNOSIS — I10 Essential (primary) hypertension: Secondary | ICD-10-CM

## 2019-10-06 LAB — COMPREHENSIVE METABOLIC PANEL
ALT: 13 U/L (ref 0–44)
AST: 12 U/L — ABNORMAL LOW (ref 15–41)
Albumin: 3.4 g/dL — ABNORMAL LOW (ref 3.5–5.0)
Alkaline Phosphatase: 77 U/L (ref 38–126)
Anion gap: 12 (ref 5–15)
BUN: 69 mg/dL — ABNORMAL HIGH (ref 8–23)
CO2: 22 mmol/L (ref 22–32)
Calcium: 9.7 mg/dL (ref 8.9–10.3)
Chloride: 102 mmol/L (ref 98–111)
Creatinine, Ser: 3.37 mg/dL — ABNORMAL HIGH (ref 0.61–1.24)
GFR calc Af Amer: 18 mL/min — ABNORMAL LOW (ref >60)
GFR calc non Af Amer: 16 mL/min — ABNORMAL LOW (ref >60)
Glucose, Bld: 140 mg/dL — ABNORMAL HIGH (ref 70–99)
Potassium: 4.6 mmol/L (ref 3.5–5.1)
Sodium: 136 mmol/L (ref 135–145)
Total Bilirubin: 0.9 mg/dL (ref 0.3–1.2)
Total Protein: 6.5 g/dL (ref 6.5–8.1)

## 2019-10-06 LAB — CBC WITH DIFFERENTIAL/PLATELET
Abs Immature Granulocytes: 0.07 10*3/uL (ref 0.00–0.07)
Basophils Absolute: 0.1 10*3/uL (ref 0.0–0.1)
Basophils Relative: 1 %
Eosinophils Absolute: 0.3 10*3/uL (ref 0.0–0.5)
Eosinophils Relative: 3 %
HCT: 35.7 % — ABNORMAL LOW (ref 39.0–52.0)
Hemoglobin: 11.3 g/dL — ABNORMAL LOW (ref 13.0–17.0)
Immature Granulocytes: 1 %
Lymphocytes Relative: 15 %
Lymphs Abs: 1.8 10*3/uL (ref 0.7–4.0)
MCH: 30.1 pg (ref 26.0–34.0)
MCHC: 31.7 g/dL (ref 30.0–36.0)
MCV: 95.2 fL (ref 80.0–100.0)
Monocytes Absolute: 1.3 10*3/uL — ABNORMAL HIGH (ref 0.1–1.0)
Monocytes Relative: 10 %
Neutro Abs: 9 10*3/uL — ABNORMAL HIGH (ref 1.7–7.7)
Neutrophils Relative %: 70 %
Platelets: 301 10*3/uL (ref 150–400)
RBC: 3.75 MIL/uL — ABNORMAL LOW (ref 4.22–5.81)
RDW: 13.7 % (ref 11.5–15.5)
WBC: 12.6 10*3/uL — ABNORMAL HIGH (ref 4.0–10.5)
nRBC: 0 % (ref 0.0–0.2)

## 2019-10-06 LAB — ECHOCARDIOGRAM COMPLETE
Height: 67 in
Weight: 2140.8 oz

## 2019-10-06 LAB — MAGNESIUM: Magnesium: 2.5 mg/dL — ABNORMAL HIGH (ref 1.7–2.4)

## 2019-10-06 LAB — SODIUM, URINE, RANDOM: Sodium, Ur: 41 mmol/L

## 2019-10-06 LAB — PROTEIN / CREATININE RATIO, URINE
Creatinine, Urine: 76.06 mg/dL
Protein Creatinine Ratio: 1.25 mg/mg{Cre} — ABNORMAL HIGH (ref 0.00–0.15)
Total Protein, Urine: 95 mg/dL

## 2019-10-06 LAB — URINE CULTURE: Culture: NO GROWTH

## 2019-10-06 LAB — PHOSPHORUS: Phosphorus: 3.7 mg/dL (ref 2.5–4.6)

## 2019-10-06 MED ORDER — BELLADONNA ALKALOIDS-OPIUM 16.2-60 MG RE SUPP
1.0000 | Freq: Once | RECTAL | Status: AC
  Administered 2019-10-06: 1 via RECTAL
  Filled 2019-10-06: qty 1

## 2019-10-06 MED ORDER — FENTANYL CITRATE (PF) 100 MCG/2ML IJ SOLN
12.5000 ug | Freq: Once | INTRAMUSCULAR | Status: AC
  Administered 2019-10-06: 12.5 ug via INTRAVENOUS
  Filled 2019-10-06: qty 2

## 2019-10-06 MED ORDER — CHLORHEXIDINE GLUCONATE CLOTH 2 % EX PADS
6.0000 | MEDICATED_PAD | Freq: Every day | CUTANEOUS | Status: DC
  Administered 2019-10-06 – 2019-10-07 (×2): 6 via TOPICAL

## 2019-10-06 NOTE — Progress Notes (Signed)
In and Out Cath yielded 900 ml of urine. Patient stated that he had immediate relief.

## 2019-10-06 NOTE — Progress Notes (Signed)
Echocardiogram 2D Echocardiogram has been performed.  Oneal Deputy Andi Mahaffy 10/06/2019, 10:02 AM

## 2019-10-06 NOTE — Progress Notes (Signed)
Patient c/o abdomen being sore. Patient has not voided tonight,so bladder scan was done and yielded 821 ml. PCP was notified.

## 2019-10-06 NOTE — Progress Notes (Signed)
PROGRESS NOTE  [name]  ELF:[id number] DOB: Nov 28, 1932 DOA: 10/05/2019 PCP: Gavin Pound, MD  Outpatient Specialists: Nephrology, Dr. Marval Regal; Oncology, Dr. Alen Blew. Brief Narrative: Jeffrey Payne is an 86 y.o. male with a history of MGUS, stage IV CKD, HTN, hypertriglyceridemia who presented to the ED 6/11 with fatigue worsening to severe over several weeks associated with generalized weakness. Evaluation in the ED revealed evidence of renal impairment, SCr 3.56, BUN 76, elevated BNP to 610 and normal troponin x3. Hgb noted to be 12, normocytic, in the absence of bleeding by history or exam. Dipstick urinalysis was unremarkable.   Assessment & Plan: Active Problems:   Hypertension   Heart murmur   CKD (chronic kidney disease), stage IV (HCC)   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HCC)   Bradycardia   Hypotension   Fatigue  Weakness, fatigue:  - PT/OT ordered - Treat conditions below  AKI on stage IV CKD vs. progression of CKD: SCr up from unclear baseline. Historic Cr values 1.7-1.8 before 2018. In 2019 up to 1.9-2.23 and now 3.56 on admission with very modest improvement since arrival. UA was unremarkable - Has been undergoing gentle hydration, exam not consistent with overload, will increase rate since po intake is minimal.  - Renal U/S - Check UPr:Cr, FENa - Avoid hypotension (holding antihypertensives as he's normotensive currently) - Avoid NSAIDs, ARB, nephrotoxins  Acute urinary retention: Recurrent problem per pt and wife. Required I/O last night for 900cc urine.  - Renal U/S as above - Place foley if hydronephrosis is noted.   History of grade 2 diastolic dysfunction, cardiomegaly: ECG with abnormal axis, mild 1st degree AVB, no ST deviations. Troponin negative x3  - Echocardiogram ordered and pending. Does not appear to have clinical heart failure at this time, though BNP is elevated.  HTN:  - Holding BP medications with soft BP. Holding ARB, metoprolol, norvasc  10mg , lasix 40mg  po daily  RLL opacity: Noted on CXR with appearance of leukocytosis to 12.6 on 6/12. No fever or cough. - Incentive spirometry, flutter valve. - Check PCT and repeat CXR in AM. If develops cough, fever, hypoxemia, or picture of sepsis, would of course begin antibiotics.  IgM MGUS Dx 2013: Was under surveillance with Dr. Alen Blew, though last visit was April 2019. - Will discuss with oncology if acute issues arise. ?if this is cause of end-organ damage/renal impairment.  DVT prophylaxis: Heparin Code Status: Full Family Communication: Wife at bedside Disposition Plan:  Status is: Inpatient  Remains inpatient appropriate because:Persistent severe electrolyte disturbances and Ongoing diagnostic testing needed not appropriate for outpatient work up   Dispo: The patient is from: Home              Anticipated d/c is to: TBD              Anticipated d/c date is: 3 days              Patient currently is not medically stable to d/c.  Consultants:   None  Procedures:   None  Antimicrobials:  None   Subjective: Sleeping soundly throughout encounter, hasn't been eating much. Had agitation due to urinary retention last night.  Objective: Vitals:   10/05/19 2300 10/06/19 0251 10/06/19 0627 10/06/19 1003  BP:  (!) 134/42 (!) 133/54 (!) 127/47  Pulse:  71 66 61  Resp:  19 18 20   Temp:  97.7 F (36.5 C) 97.7 F (36.5 C) 98.4 F (36.9 C)  TempSrc:  Oral Oral Oral  SpO2:  91% 93% 95%  Weight: 60.7 kg     Height: 5\' 7"  (1.702 m)       Intake/Output Summary (Last 24 hours) at 10/06/2019 1130 Last data filed at 10/06/2019 0417 Gross per 24 hour  Intake 959.16 ml  Output 900 ml  Net 59.16 ml   Filed Weights   10/05/19 1423 10/05/19 2300  Weight: 68 kg 60.7 kg    Gen: Elderly male in no distress Pulm: Non-labored breathing room air completely supine. Clear to auscultation bilaterally.  CV: Regular rate and rhythm. No murmur, rub, or gallop. No JVD, no pitting  pedal edema. GI: Abdomen soft, non-tender, non-distended, with normoactive bowel sounds. No organomegaly or masses felt. Ext: Warm, no deformities Skin: No rashes, lesions or ulcers Neuro: Drowsy. No focal neurological deficits on limited exam. Psych: UTD  Data Reviewed: I have personally reviewed following labs and imaging studies  CBC: Recent Labs  Lab 10/05/19 1349 10/05/19 1856 10/06/19 0310  WBC 9.9 9.9 12.6*  NEUTROABS 5.1  --  9.0*  HGB 12.0* 12.0* 11.3*  HCT 37.2* 37.7* 35.7*  MCV 95.1 96.7 95.2  PLT 280 295 462   Basic Metabolic Panel: Recent Labs  Lab 10/05/19 1349 10/05/19 1856 10/06/19 0310  NA 134*  --  136  K 4.9  --  4.6  CL 100  --  102  CO2 23  --  22  GLUCOSE 165*  --  140*  BUN 76*  --  69*  CREATININE 3.56* 3.42* 3.37*  CALCIUM 9.5  --  9.7  MG  --   --  2.5*  PHOS  --   --  3.7   GFR: Estimated Creatinine Clearance: 14 mL/min (A) (by C-G formula based on SCr of 3.37 mg/dL (H)). Liver Function Tests: Recent Labs  Lab 10/05/19 1349 10/06/19 0310  AST 13* 12*  ALT 14 13  ALKPHOS 78 77  BILITOT 0.5 0.9  PROT 6.4* 6.5  ALBUMIN 3.3* 3.4*   No results for input(s): LIPASE, AMYLASE in the last 168 hours. No results for input(s): AMMONIA in the last 168 hours. Coagulation Profile: No results for input(s): INR, PROTIME in the last 168 hours. Cardiac Enzymes: No results for input(s): CKTOTAL, CKMB, CKMBINDEX, TROPONINI in the last 168 hours. BNP (last 3 results) No results for input(s): PROBNP in the last 8760 hours. HbA1C: No results for input(s): HGBA1C in the last 72 hours. CBG: No results for input(s): GLUCAP in the last 168 hours. Lipid Profile: No results for input(s): CHOL, HDL, LDLCALC, TRIG, CHOLHDL, LDLDIRECT in the last 72 hours. Thyroid Function Tests: Recent Labs    10/05/19 1856  TSH 1.876   Anemia Panel: No results for input(s): VITAMINB12, FOLATE, FERRITIN, TIBC, IRON, RETICCTPCT in the last 72 hours. Urine  analysis:    Component Value Date/Time   COLORURINE STRAW (A) 10/05/2019 1338   APPEARANCEUR CLEAR 10/05/2019 1338   LABSPEC 1.005 10/05/2019 1338   PHURINE 7.0 10/05/2019 1338   GLUCOSEU NEGATIVE 10/05/2019 1338   HGBUR NEGATIVE 10/05/2019 1338   BILIRUBINUR NEGATIVE 10/05/2019 1338   KETONESUR NEGATIVE 10/05/2019 1338   PROTEINUR NEGATIVE 10/05/2019 1338   NITRITE NEGATIVE 10/05/2019 1338   LEUKOCYTESUR NEGATIVE 10/05/2019 1338   Recent Results (from the past 240 hour(s))  SARS Coronavirus 2 by RT PCR (hospital order, performed in [facility] hospital lab) Nasopharyngeal Nasopharyngeal Swab     Status: None   Collection Time: 10/05/19  3:12 PM   Specimen: Nasopharyngeal Swab  Result Value Ref Range Status  SARS Coronavirus 2 NEGATIVE NEGATIVE Final    Comment: (NOTE) SARS-CoV-2 target nucleic acids are NOT DETECTED.  The SARS-CoV-2 RNA is generally detectable in upper and lower respiratory specimens during the acute phase of infection. The lowest concentration of SARS-CoV-2 viral copies this assay can detect is 250 copies / mL. A negative result does not preclude SARS-CoV-2 infection and should not be used as the sole basis for treatment or other patient management decisions.  A negative result may occur with improper specimen collection / handling, submission of specimen other than nasopharyngeal swab, presence of viral mutation(s) within the areas targeted by this assay, and inadequate number of viral copies (<250 copies / mL). A negative result must be combined with clinical observations, patient history, and epidemiological information.  Fact Sheet for Patients:   StrictlyIdeas.no  Fact Sheet for Healthcare Providers: BankingDealers.co.za  This test is not yet approved or  cleared by the [place] FDA and has been authorized for detection and/or diagnosis of SARS-CoV-2 by FDA under an Emergency Use Authorization  (EUA).  This EUA will remain in effect (meaning this test can be used) for the duration of the COVID-19 declaration under Section 564(b)(1) of the Act, 21 U.S.C. section 360bbb-3(b)(1), unless the authorization is terminated or revoked sooner.  Performed at [facility], [place] [address], [place], [place] [address]       Radiology Studies: DG Chest 2 View  Result Date: 10/05/2019 CLINICAL DATA:  Fatigue and shortness of breath EXAM: CHEST - 2 VIEW COMPARISON:  06/16/2013 FINDINGS: Cardiac shadow is enlarged. Aortic calcifications are again seen. The lungs are well aerated bilaterally. Calcified granuloma is again seen in the left upper lobe. Mild opacity is noted in the right lung base projecting in the lower lobe consistent with early infiltrate. No bony abnormality is noted. IMPRESSION: Early right basilar infiltrate. Electronically Signed   By: Inez Catalina M.D.   On: 10/05/2019 15:31    Scheduled Meds: . aspirin EC  81 mg Oral Daily  . heparin  5,000 Units Subcutaneous Q8H   Continuous Infusions: . sodium chloride 50 mL/hr at 10/05/19 1849     LOS: 1 day   Time spent: 35 minutes.  Patrecia Pour, MD [facility] [email or web address] 10/06/2019, 11:30 AM

## 2019-10-06 NOTE — Progress Notes (Signed)
Patient c/o bladder spasms and rating pain at 6-7/10.  PCP was notified

## 2019-10-06 NOTE — Progress Notes (Signed)
Patient is experiencing urinary retention and unable to void despite already having an in and out cath.  Spoke with the hospitalist and wants to go ahead and order a foley to be placed since he has a history of rentention and renal failure.

## 2019-10-06 NOTE — Evaluation (Signed)
Physical Therapy Evaluation Patient Details Name: Jeffrey Payne MRN: [id number] DOB: 07/14/1932 Today's Date: 10/06/2019   History of Present Illness  86 y.o. male with a history of MGUS, stage IV CKD, HTN, hypertriglyceridemia who presented to the ED 6/11 with fatigue worsening to severe over several weeks associated with generalized weakness. Evaluation in the ED revealed evidence of renal impairment  Clinical Impression  Pt admitted with above diagnosis.  Pt  Very pleasant and willing to mobilize. amb ~ 160' with min assist to min/guard for balance and safety. May benefit from [facility] at d/c as he attempts to hold rails/furniture etc at time of eval (depending on progress, continue to assess need for any DME) Pt currently with functional limitations due to the deficits listed below (see PT Problem List). Pt will benefit from skilled PT to increase their independence and safety with mobility to allow discharge to the venue listed below.       Follow Up Recommendations No PT follow up    Equipment Recommendations  Other (comment) (?SPC)    Recommendations for Other Services       Precautions / Restrictions Precautions Precautions: Fall Restrictions Weight Bearing Restrictions: No      Mobility  Bed Mobility Overal bed mobility: Needs Assistance Bed Mobility: Supine to Sit;Sit to Supine     Supine to sit: Supervision;HOB elevated Sit to supine: Supervision   General bed mobility comments: incr time, supervision for safety  Transfers Overall transfer level: Needs assistance Equipment used: None Transfers: Sit to/from Stand Sit to Stand: Min guard;Min assist         General transfer comment: unsteady on intitial stand  Ambulation/Gait Ambulation/Gait assistance: Min guard;Min assist Gait Distance (Feet): 160 Feet Assistive device: None Gait Pattern/deviations: Step-through pattern;Decreased stride length;Wide base of support Gait velocity: decr   General Gait  Details: unsteady gait, intermittently attempts to hold rail in hallway (wife denies furniture amb at home). requiring min to min/guard assist for overall balance and safety  Stairs            Wheelchair Mobility    Modified Rankin (Stroke Patients Only)       Balance Overall balance assessment: Needs assistance Sitting-balance support: No upper extremity supported;Feet supported Sitting balance-Leahy Scale: Good       Standing balance-Leahy Scale: Fair Standing balance comment: reliant on unilateral UE support for dynamic tasks                             Pertinent Vitals/Pain Pain Assessment: No/denies pain    Home Living Family/patient expects to be discharged to:: Private residence Living Arrangements: Spouse/significant other             Home Equipment: None      Prior Function Level of Independence: Independent               Hand Dominance        Extremity/Trunk Assessment   Upper Extremity Assessment Upper Extremity Assessment: Overall WFL for tasks assessed    Lower Extremity Assessment Lower Extremity Assessment: Overall WFL for tasks assessed       Communication   Communication: HOH (wears hearing aids, does not have them currently)  Cognition Arousal/Alertness: Awake/alert Behavior During Therapy: WFL for tasks assessed/performed Overall Cognitive Status: Within Functional Limits for tasks assessed  General Comments      Exercises     Assessment/Plan    PT Assessment Patient needs continued PT services  PT Problem List Decreased strength;Decreased activity tolerance;Decreased balance;Decreased knowledge of use of DME;Decreased mobility       PT Treatment Interventions DME instruction;Therapeutic exercise;Gait training;Functional mobility training;Therapeutic activities;Patient/family education;Balance training    PT Goals (Current goals can be found in  the Care Plan section)  Acute Rehab PT Goals Patient Stated Goal: home soon PT Goal Formulation: With patient Time For Goal Achievement: 10/20/19 Potential to Achieve Goals: Good    Frequency Min 3X/week   Barriers to discharge        Co-evaluation               AM-PAC PT "6 Clicks" Mobility  Outcome Measure Help needed turning from your back to your side while in a flat bed without using bedrails?: None Help needed moving from lying on your back to sitting on the side of a flat bed without using bedrails?: None Help needed moving to and from a bed to a chair (including a wheelchair)?: A Little Help needed standing up from a chair using your arms (e.g., wheelchair or bedside chair)?: A Little Help needed to walk in hospital room?: A Little Help needed climbing 3-5 steps with a railing? : A Little 6 Click Score: 20    End of Session Equipment Utilized During Treatment: Gait belt Activity Tolerance: Patient tolerated treatment well Patient left: in bed;with call bell/phone within reach;with family/visitor present;with bed alarm set Nurse Communication: Mobility status PT Visit Diagnosis: Difficulty in walking, not elsewhere classified (R26.2)    Time: 0141-0301 PT Time Calculation (min) (ACUTE ONLY): 24 min   Charges:   PT Evaluation $PT Eval Low Complexity: 1 Low PT Treatments $Gait Training: 8-22 mins        Baxter Flattery, PT  Acute Rehab Dept ([place]) [phone] Pager [phone]  10/06/2019   [facility] 10/06/2019, 4:29 PM

## 2019-10-07 ENCOUNTER — Encounter (HOSPITAL_COMMUNITY): Payer: Self-pay | Admitting: Internal Medicine

## 2019-10-07 ENCOUNTER — Inpatient Hospital Stay (HOSPITAL_COMMUNITY): Payer: Medicare Other

## 2019-10-07 LAB — CBC WITH DIFFERENTIAL/PLATELET
Abs Immature Granulocytes: 0.03 10*3/uL (ref 0.00–0.07)
Basophils Absolute: 0.1 10*3/uL (ref 0.0–0.1)
Basophils Relative: 1 %
Eosinophils Absolute: 0.8 10*3/uL — ABNORMAL HIGH (ref 0.0–0.5)
Eosinophils Relative: 8 %
HCT: 31.6 % — ABNORMAL LOW (ref 39.0–52.0)
Hemoglobin: 9.8 g/dL — ABNORMAL LOW (ref 13.0–17.0)
Immature Granulocytes: 0 %
Lymphocytes Relative: 24 %
Lymphs Abs: 2.2 10*3/uL (ref 0.7–4.0)
MCH: 30.6 pg (ref 26.0–34.0)
MCHC: 31 g/dL (ref 30.0–36.0)
MCV: 98.8 fL (ref 80.0–100.0)
Monocytes Absolute: 1.3 10*3/uL — ABNORMAL HIGH (ref 0.1–1.0)
Monocytes Relative: 15 %
Neutro Abs: 4.9 10*3/uL (ref 1.7–7.7)
Neutrophils Relative %: 52 %
Platelets: 272 10*3/uL (ref 150–400)
RBC: 3.2 MIL/uL — ABNORMAL LOW (ref 4.22–5.81)
RDW: 14 % (ref 11.5–15.5)
WBC: 9.3 10*3/uL (ref 4.0–10.5)
nRBC: 0 % (ref 0.0–0.2)

## 2019-10-07 LAB — PROCALCITONIN: Procalcitonin: <0.1 ng/mL

## 2019-10-07 LAB — COMPREHENSIVE METABOLIC PANEL
ALT: 11 U/L (ref 0–44)
AST: 12 U/L — ABNORMAL LOW (ref 15–41)
Albumin: 2.9 g/dL — ABNORMAL LOW (ref 3.5–5.0)
Alkaline Phosphatase: 64 U/L (ref 38–126)
Anion gap: 7 (ref 5–15)
BUN: 59 mg/dL — ABNORMAL HIGH (ref 8–23)
CO2: 23 mmol/L (ref 22–32)
Calcium: 9 mg/dL (ref 8.9–10.3)
Chloride: 108 mmol/L (ref 98–111)
Creatinine, Ser: 3.25 mg/dL — ABNORMAL HIGH (ref 0.61–1.24)
GFR calc Af Amer: 19 mL/min — ABNORMAL LOW (ref >60)
GFR calc non Af Amer: 17 mL/min — ABNORMAL LOW (ref >60)
Glucose, Bld: 94 mg/dL (ref 70–99)
Potassium: 4.5 mmol/L (ref 3.5–5.1)
Sodium: 138 mmol/L (ref 135–145)
Total Bilirubin: 0.7 mg/dL (ref 0.3–1.2)
Total Protein: 5.6 g/dL — ABNORMAL LOW (ref 6.5–8.1)

## 2019-10-07 LAB — MAGNESIUM: Magnesium: 2.4 mg/dL (ref 1.7–2.4)

## 2019-10-07 LAB — PHOSPHORUS: Phosphorus: 3.6 mg/dL (ref 2.5–4.6)

## 2019-10-07 MED ORDER — TAMSULOSIN HCL 0.4 MG PO CAPS
0.4000 mg | ORAL_CAPSULE | Freq: Every day | ORAL | Status: DC
  Administered 2019-10-07 – 2019-10-08 (×2): 0.4 mg via ORAL
  Filled 2019-10-07 (×2): qty 1

## 2019-10-07 MED ORDER — AMLODIPINE BESYLATE 5 MG PO TABS
5.0000 mg | ORAL_TABLET | Freq: Every day | ORAL | Status: DC
  Administered 2019-10-07 – 2019-10-09 (×3): 5 mg via ORAL
  Filled 2019-10-07 (×3): qty 1

## 2019-10-07 NOTE — Evaluation (Signed)
Occupational Therapy Evaluation and Discharge Patient Details Name: Jeffrey Payne MRN: [id number] DOB: [birth year] Today's Date: 10/07/2019    History of Present Illness 86 y.o. male with a history of MGUS, stage IV CKD, HTN, hypertriglyceridemia who presented to the ED 6/11 with fatigue worsening to severe over several weeks associated with generalized weakness. Evaluation in the ED revealed evidence of renal impairment   Clinical Impression   This 86 yo male admitted with above presents to acute OT at an overall S level and has this from his wife at home. Pt with audible SOB when back from walk--O2 sats 94% on RA. No further OT needs we will sign off.    Follow Up Recommendations  No OT follow up;Supervision/Assistance - 24 hour    Equipment Recommendations  None recommended by OT       Precautions / Restrictions Precautions Precautions: Fall Restrictions Weight Bearing Restrictions: No      Mobility Bed Mobility Overal bed mobility: Modified Independent             General bed mobility comments: HOB up  Transfers Overall transfer level: Needs assistance Equipment used: None Transfers: Sit to/from Stand Sit to Stand: Supervision         General transfer comment: Pt ambulated 100 feet with S and no AD    Balance Overall balance assessment: Mild deficits observed, not formally tested                                         ADL either performed or assessed with clinical judgement   ADL Overall ADL's : Needs assistance/impaired Eating/Feeding: Independent;Sitting   Grooming: Set up;Supervision/safety;Standing   Upper Body Bathing: Set up;Supervision/ safety;Sitting   Lower Body Bathing: Supervison/ safety;Set up;Sit to/from stand   Upper Body Dressing : Set up;Supervision/safety;Sitting   Lower Body Dressing: Supervision/safety;Set up;Sit to/from stand   Toilet Transfer: Supervision/safety;Ambulation;Regular Toilet   Toileting-  Water quality scientist and Hygiene: Supervision/safety;Sit to/from stand               Vision Patient Visual Report: No change from baseline              Pertinent Vitals/Pain Pain Assessment: No/denies pain     Hand Dominance Right   Extremity/Trunk Assessment Upper Extremity Assessment Upper Extremity Assessment: Overall WFL for tasks assessed           Communication Communication Communication: HOH (wears hearing aids but does not have them here with him)   Cognition Arousal/Alertness: Awake/alert Behavior During Therapy: Flat affect Overall Cognitive Status: Within Functional Limits for tasks assessed                                                Home Living Family/patient expects to be discharged to:: Private residence Living Arrangements: Spouse/significant other Available Help at Discharge: Family;Available 24 hours/day Type of Home: House Home Access: Level entry     Home Layout: Two level Alternate Level Stairs-Number of Steps: 10   Bathroom Shower/Tub: Tub/shower unit;Curtain   Bathroom Toilet: Standard     Home Equipment: None   Additional Comments: wife has ordered a shower chair      Prior Functioning/Environment Level of Independence: Independent  OT Problem List: Impaired balance (sitting and/or standing)         OT Goals(Current goals can be found in the care plan section) Acute Rehab OT Goals Patient Stated Goal: to go home  OT Frequency:                AM-PAC OT "6 Clicks" Daily Activity     Outcome Measure Help from another person eating meals?: None Help from another person taking care of personal grooming?: A Little Help from another person toileting, which includes using toliet, bedpan, or urinal?: A Little Help from another person bathing (including washing, rinsing, drying)?: A Little Help from another person to put on and taking off regular upper body clothing?: A  Little Help from another person to put on and taking off regular lower body clothing?: A Little 6 Click Score: 19   End of Session Equipment Utilized During Treatment: Gait belt  Activity Tolerance: Patient tolerated treatment well Patient left: in chair;with call bell/phone within reach;with chair alarm set;with family/visitor present  OT Visit Diagnosis: Unsteadiness on feet (R26.81);Muscle weakness (generalized) (M62.81)                Time: 1025-8527 OT Time Calculation (min): 34 min Charges:  OT General Charges $OT Visit: 1 Visit OT Evaluation $OT Eval Moderate Complexity: 1 Mod OT Treatments $Self Care/Home Management : 8-22 mins  [address], OTR/L Acute [employer] Pager [phone] Office [phone]     Almon Register 10/07/2019, 12:38 PM

## 2019-10-07 NOTE — Progress Notes (Addendum)
PROGRESS NOTE  Jeffrey Payne  WNI:[id number] DOB: 02/11/1933 DOA: 10/05/2019 PCP: Gavin Pound, MD  Outpatient Specialists: Nephrology, Dr. Marval Regal; Oncology, Dr. Alen Blew. Brief Narrative: Jeffrey Payne is an 86 y.o. male with a history of MGUS, stage IV CKD, HTN, hypertriglyceridemia who presented to the ED 6/11 with fatigue worsening to severe over several weeks associated with generalized weakness. Evaluation in the ED revealed evidence of renal impairment, SCr 3.56, BUN 76, elevated BNP to 610 and normal troponin x3. Hgb noted to be 12, normocytic, in the absence of bleeding by history or exam. Dipstick urinalysis was unremarkable.   Assessment & Plan: Principal Problem:   Acute kidney injury superimposed on CKD ([place]) Active Problems:   Hypertension   Heart murmur   CKD (chronic kidney disease), stage IV (HCC)   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HCC)   Bradycardia   Hypotension   Fatigue  Fatigue: No actual focal weakness on exam, and other than tiring quickly, he remains functionally independent. PT/OT have signed off.  - Treat conditions below  AKI on stage IV CKD vs. progression of CKD: Biopsy-proven hypertensive nephrosclerosis. - UA was unremarkable. Renal U/S consistent with medical renal disease without hydronephrosis. FENa 1.27% suggestive of intrarenal etiology due to ARB, hemodynamics (hypotension due to dehydration), though may have had some acute obstructive uropathy improved with foley and an element of prerenal azotemia w/improvement from IVF - Nephrology consulted for further recommendations. Will monitor another day, can stop IV fluids if labs improving.  - Avoid hypotension - Avoid NSAIDs, ARB, nephrotoxins  Acute urinary retention: Recurrent problem per pt and wife, has seen Dr. Raynelle Bring for prostate enlargement in the past. - Foley placed 6/12 for recurrent retention - Start flomax, plan voiding trial 6/14. Recommend urology follow up.   History of grade  2 diastolic dysfunction, cardiomegaly: ECG with abnormal axis, mild 1st degree AVB, no ST deviations. Troponin negative x3  - Echocardiogram ordered and pending. Does not appear to have clinical heart failure at this time, though BNP is elevated.  HTN:  - Holding BP medications with soft BP. Holding ARB, metoprolol, norvasc 10mg , lasix 40mg  po daily. BP persistently elevated, will restart norvasc. HR sustaining in 50's, so holding metoprolol.  RLL opacity: Noted on CXR. No leukocytosis, PCT negative, no clinical features of pneumonia.  - Never smoker, but in setting of unexplained constitutional symptoms and opacity without significant suspicion for PNA, we will check CT chest.  - Incentive spirometry, flutter valve.  IgM MGUS Dx 2013: Was under surveillance with Dr. Alen Blew, though last visit was April 2019. - Will discuss with oncology if acute issues arise. ?if this is cause of end-organ damage/renal impairment.  Anemia of chronic kidney disease:  - Recheck in AM, drop today likely hemodilutional as there's been no report of bleeding.  DVT prophylaxis: Heparin Code Status: Full Family Communication: Wife at bedside Disposition Plan:  Status is: Inpatient  Remains inpatient appropriate because:Persistent severe electrolyte disturbances and Ongoing diagnostic testing needed not appropriate for outpatient work up   Dispo: The patient is from: Home              Anticipated d/c is to: TBD              Anticipated d/c date is: 3 days              Patient currently is not medically stable to d/c.  Consultants:   None  Procedures:   None  Antimicrobials:  None   Subjective: Eating more, still <  50%. Wife thinks he should drink more water. He walked with therapy around the entire unit yesterday without hypoxia. He denies fever or cough or dyspnea or sputum. No significant leg swelling.  Objective: Vitals:   10/06/19 2056 10/07/19 0500 10/07/19 0500 10/07/19 1345  BP: (!)  140/47  (!) 150/48 (!) 150/43  Pulse: 61  61 60  Resp: 18  18 16   Temp: 99.2 F (37.3 C)  99 F (37.2 C) 97.7 F (36.5 C)  TempSrc: Oral  Oral Oral  SpO2: 96%  96% 91%  Weight:  60.5 kg    Height:        Intake/Output Summary (Last 24 hours) at 10/07/2019 1515 Last data filed at 10/07/2019 1300 Gross per 24 hour  Intake 1704.89 ml  Output 1300 ml  Net 404.89 ml   Filed Weights   10/05/19 1423 10/05/19 2300 10/07/19 0500  Weight: 68 kg 60.7 kg 60.5 kg   Gen: Elderly male in no distress Pulm: Nonlabored breathing room air. RLL crackles stable. CV: Regular rate and rhythm. No murmur, rub, or gallop. No JVD, no significant dependent edema. GI: Abdomen soft, non-tender, non-distended, with normoactive bowel sounds.  Ext: Warm, no deformities Skin: No new rashes, lesions or ulcers on visualized skin. Scattered ecchymoses, stable. Neuro: Alert and oriented. No focal neurological deficits. Psych: Judgement and insight appear fair. Mood euthymic & affect congruent. Behavior is appropriate.    Data Reviewed: I have personally reviewed following labs and imaging studies  CBC: Recent Labs  Lab 10/05/19 1349 10/05/19 1856 10/06/19 0310 10/07/19 0334  WBC 9.9 9.9 12.6* 9.3  NEUTROABS 5.1  --  9.0* 4.9  HGB 12.0* 12.0* 11.3* 9.8*  HCT 37.2* 37.7* 35.7* 31.6*  MCV 95.1 96.7 95.2 98.8  PLT 280 295 301 809   Basic Metabolic Panel: Recent Labs  Lab 10/05/19 1349 10/05/19 1856 10/06/19 0310 10/07/19 0334  NA 134*  --  136 138  K 4.9  --  4.6 4.5  CL 100  --  102 108  CO2 23  --  22 23  GLUCOSE 165*  --  140* 94  BUN 76*  --  69* 59*  CREATININE 3.56* 3.42* 3.37* 3.25*  CALCIUM 9.5  --  9.7 9.0  MG  --   --  2.5* 2.4  PHOS  --   --  3.7 3.6   GFR: Estimated Creatinine Clearance: 14.5 mL/min (A) (by C-G formula based on SCr of 3.25 mg/dL (H)). Liver Function Tests: Recent Labs  Lab 10/05/19 1349 10/06/19 0310 10/07/19 0334  AST 13* 12* 12*  ALT 14 13 11   ALKPHOS  78 77 64  BILITOT 0.5 0.9 0.7  PROT 6.4* 6.5 5.6*  ALBUMIN 3.3* 3.4* 2.9*   No results for input(s): LIPASE, AMYLASE in the last 168 hours. No results for input(s): AMMONIA in the last 168 hours. Coagulation Profile: No results for input(s): INR, PROTIME in the last 168 hours. Cardiac Enzymes: No results for input(s): CKTOTAL, CKMB, CKMBINDEX, TROPONINI in the last 168 hours. BNP (last 3 results) No results for input(s): PROBNP in the last 8760 hours. HbA1C: No results for input(s): HGBA1C in the last 72 hours. CBG: No results for input(s): GLUCAP in the last 168 hours. Lipid Profile: No results for input(s): CHOL, HDL, LDLCALC, TRIG, CHOLHDL, LDLDIRECT in the last 72 hours. Thyroid Function Tests: Recent Labs    10/05/19 1856  TSH 1.876   Anemia Panel: No results for input(s): VITAMINB12, FOLATE, FERRITIN, TIBC, IRON, RETICCTPCT  in the last 72 hours. Urine analysis:    Component Value Date/Time   COLORURINE STRAW (A) 10/05/2019 1338   APPEARANCEUR CLEAR 10/05/2019 1338   LABSPEC 1.005 10/05/2019 1338   PHURINE 7.0 10/05/2019 1338   GLUCOSEU NEGATIVE 10/05/2019 1338   HGBUR NEGATIVE 10/05/2019 1338   BILIRUBINUR NEGATIVE 10/05/2019 1338   KETONESUR NEGATIVE 10/05/2019 1338   PROTEINUR NEGATIVE 10/05/2019 1338   NITRITE NEGATIVE 10/05/2019 1338   LEUKOCYTESUR NEGATIVE 10/05/2019 1338   Recent Results (from the past 240 hour(s))  SARS Coronavirus 2 by RT PCR (hospital order, performed in [facility] hospital lab) Nasopharyngeal Nasopharyngeal Swab     Status: None   Collection Time: 10/05/19  3:12 PM   Specimen: Nasopharyngeal Swab  Result Value Ref Range Status   SARS Coronavirus 2 NEGATIVE NEGATIVE Final    Comment: (NOTE) SARS-CoV-2 target nucleic acids are NOT DETECTED.  The SARS-CoV-2 RNA is generally detectable in upper and lower respiratory specimens during the acute phase of infection. The lowest concentration of SARS-CoV-2 viral copies this assay can detect  is 250 copies / mL. A negative result does not preclude SARS-CoV-2 infection and should not be used as the sole basis for treatment or other patient management decisions.  A negative result may occur with improper specimen collection / handling, submission of specimen other than nasopharyngeal swab, presence of viral mutation(s) within the areas targeted by this assay, and inadequate number of viral copies (<250 copies / mL). A negative result must be combined with clinical observations, patient history, and epidemiological information.  Fact Sheet for Patients:   StrictlyIdeas.no  Fact Sheet for Healthcare Providers: BankingDealers.co.za  This test is not yet approved or  cleared by the [place] FDA and has been authorized for detection and/or diagnosis of SARS-CoV-2 by FDA under an Emergency Use Authorization (EUA).  This EUA will remain in effect (meaning this test can be used) for the duration of the COVID-19 declaration under Section 564(b)(1) of the Act, 21 U.S.C. section 360bbb-3(b)(1), unless the authorization is terminated or revoked sooner.  Performed at [facility], [place] [address], [place], [place] [address]   Urine culture     Status: None   Collection Time: 10/05/19  6:56 PM   Specimen: Urine, Random  Result Value Ref Range Status   Specimen Description   Final    URINE, RANDOM Performed at [facility] [address] [place], [place] [address]    Special Requests   Final    NONE Performed at [facility], [place] [address], [place], [place] [address]    Culture   Final    NO GROWTH Performed at [facility], [place] [address] [place], [place] [address]    Report Status 10/06/2019 FINAL  Final      Radiology Studies: CT CHEST WO CONTRAST  Result Date: 10/07/2019 CLINICAL DATA:  86 year old male with abnormal chest x-rays yesterday. Fatigue and  shortness of breath. EXAM: CT CHEST WITHOUT CONTRAST TECHNIQUE: Multidetector CT imaging of the chest was performed following the standard protocol without IV contrast. COMPARISON:  Chest radiographs yesterday and earlier. FINDINGS: Cardiovascular: Calcified aortic atherosclerosis. Calcified coronary artery atherosclerosis. Vascular patency is not evaluated in the absence of IV contrast. Cardiac size at the upper limits of normal. Small volume of pericardial fluid could be physiologic (series 2, image 114). Mediastinum/Nodes: Negative.  No lymphadenopathy. Lungs/Pleura: Trace bilateral dependent pleural fluid. There is a small focus of posterior right pleural calcification in  the lower lobe. Major airways are patent. Coarsely calcified left upper lobe granuloma with minor regional scarring. Much smaller calcified right upper lobe granuloma on series 5, image 27. Asymmetric right middle and lower lobe subpleural reticular opacity. Several superimposed small calcified right lower lobe granulomas. Negative left lung base. Upper Abdomen: Negative visible noncontrast liver, gallbladder, spleen, pancreas, adrenal glands and kidneys. Moderate distension of the stomach with gas and food debris. Other visible bowel loops appear negative. Musculoskeletal: Generally mild for age degenerative changes. There is a conspicuous thoracic partially calcified disc herniation at T8-T9. No acute osseous abnormality identified. IMPRESSION: 1. Right middle and lower lobe subpleural reticular opacity is nonspecific but suspicious for acute distal airway infection. Asymmetric interstitial lung disease is also possible. 2. Trace bilateral pleural fluid. Trace right pleural calcification. 3. Small pericardial effusion, could be physiologic. 4. Multiple pulmonary calcified granulomas, the largest in the left upper lobe. 5. Calcified coronary artery and Aortic atherosclerosis (ICD10-I70.0). Electronically Signed   By: Genevie Ann M.D.   On:  10/07/2019 13:35   US RENAL  Result Date: 10/06/2019 CLINICAL DATA:  Acute renal injury EXAM: RENAL / URINARY TRACT ULTRASOUND COMPLETE COMPARISON:  None. FINDINGS: Right Kidney: Renal measurements: 10.8 x 4.7 x 5.2 cm. = volume: 141 mL. Cortical thinning is noted with increased echogenicity. No hydronephrosis is seen. Left Kidney: Renal measurements: 11.0 x 6.3 x 5.6 cm. = volume: 205 mL. 12 cm cyst is noted in the left kidney, simple in nature. Increased echogenicity and cortical thinning is seen. No obstructive changes are noted. Bladder: Appears normal for degree of bladder distention. Other: None. IMPRESSION: Large left renal cyst. Increased echogenicity and cortical thinning consistent with medical renal disease. Electronically Signed   By: Inez Catalina M.D.   On: 10/06/2019 15:47   ECHOCARDIOGRAM COMPLETE  Result Date: 10/06/2019    ECHOCARDIOGRAM REPORT   Patient Name:   Jeffrey Payne Date of Exam: 10/06/2019 Medical Rec #:  [id number]      Height:       67.0 in Accession #:    [id number]     Weight:       133.8 lb Date of Birth:  06/16/1932      BSA:          1.705 m Patient Age:    23 years       BP:           133/54 mmHg Patient Gender: M              HR:           62 bpm. Exam Location:  Inpatient Procedure: 2D Echo, Color Doppler and Cardiac Doppler Indications:    Fatigue  History:        Patient has prior history of Echocardiogram examinations, most                 recent 03/11/2016. Risk Factors:Hypertension.  Sonographer:    Raquel Sarna Senior RDCS Referring Phys: [id number] [place]  1. Abnormal septal motion . Left ventricular ejection fraction, by estimation, is 50 to 55%. The left ventricle has low normal function. The left ventricle has no regional wall motion abnormalities. The left ventricular internal cavity size was mildly dilated. There is mild left ventricular hypertrophy. Left ventricular diastolic parameters are consistent with age-related delayed relaxation (normal).  Elevated left ventricular end-diastolic pressure.  2. Right ventricular systolic function is normal. The right ventricular size is normal.  3. The mitral valve is degenerative.  No evidence of mitral valve regurgitation. No evidence of mitral stenosis.  4. The aortic valve is normal in structure. Aortic valve regurgitation is moderate. Mild aortic valve sclerosis is present, with no evidence of aortic valve stenosis.  5. Aortic dilatation noted. There is mild dilatation of the aortic root measuring 38 mm.  6. The inferior vena cava is normal in size with greater than 50% respiratory variability, suggesting right atrial pressure of 3 mmHg. FINDINGS  Left Ventricle: Abnormal septal motion. Left ventricular ejection fraction, by estimation, is 50 to 55%. The left ventricle has low normal function. The left ventricle has no regional wall motion abnormalities. The left ventricular internal cavity size was mildly dilated. There is mild left ventricular hypertrophy. Left ventricular diastolic parameters are consistent with age-related delayed relaxation (normal). Elevated left ventricular end-diastolic pressure. Right Ventricle: The right ventricular size is normal. No increase in right ventricular wall thickness. Right ventricular systolic function is normal. Left Atrium: Left atrial size was normal in size. Right Atrium: Right atrial size was normal in size. Pericardium: There is no evidence of pericardial effusion. Mitral Valve: The mitral valve is degenerative in appearance. There is moderate thickening of the mitral valve leaflet(s). There is moderate calcification of the mitral valve leaflet(s). Normal mobility of the mitral valve leaflets. Mild mitral annular calcification. No evidence of mitral valve regurgitation. No evidence of mitral valve stenosis. Tricuspid Valve: The tricuspid valve is normal in structure. Tricuspid valve regurgitation is not demonstrated. No evidence of tricuspid stenosis. Aortic Valve: The  aortic valve is normal in structure. Aortic valve regurgitation is moderate. Aortic regurgitation PHT measures 346 msec. Mild aortic valve sclerosis is present, with no evidence of aortic valve stenosis. Aortic valve mean gradient measures 5.0 mmHg. Aortic valve peak gradient measures 9.7 mmHg. Aortic valve area, by VTI measures 2.56 cm. Pulmonic Valve: The pulmonic valve was normal in structure. Pulmonic valve regurgitation is not visualized. No evidence of pulmonic stenosis. Aorta: The aortic root is normal in size and structure and aortic dilatation noted. There is mild dilatation of the aortic root measuring 38 mm. Venous: The inferior vena cava is normal in size with greater than 50% respiratory variability, suggesting right atrial pressure of 3 mmHg. IAS/Shunts: No atrial level shunt detected by color flow Doppler.  LEFT VENTRICLE PLAX 2D LVIDd:         5.10 cm  Diastology LVIDs:         3.10 cm  LV e' lateral:   3.26 cm/s LV PW:         1.10 cm  LV E/e' lateral: 20.1 LV IVS:        1.20 cm  LV e' medial:    3.92 cm/s LVOT diam:     1.90 cm  LV E/e' medial:  16.7 LV SV:         88 LV SV Index:   52 LVOT Area:     2.84 cm  RIGHT VENTRICLE RV S prime:     17.30 cm/s TAPSE (M-mode): 1.5 cm LEFT ATRIUM             Index       RIGHT ATRIUM           Index LA diam:        3.70 cm 2.17 cm/m  RA Area:     12.90 cm LA Vol (A2C):   65.4 ml 38.37 ml/m RA Volume:   25.30 ml  14.84 ml/m LA Vol (A4C):   49.2  ml 28.86 ml/m LA Biplane Vol: 61.9 ml 36.31 ml/m  AORTIC VALVE AV Area (Vmax):    2.16 cm AV Area (Vmean):   2.11 cm AV Area (VTI):     2.56 cm AV Vmax:           156.00 cm/s AV Vmean:          103.000 cm/s AV VTI:            0.343 m AV Peak Grad:      9.7 mmHg AV Mean Grad:      5.0 mmHg LVOT Vmax:         119.00 cm/s LVOT Vmean:        76.700 cm/s LVOT VTI:          0.310 m LVOT/AV VTI ratio: 0.90 AI PHT:            346 msec  AORTA Ao Root diam: 3.30 cm MITRAL VALVE MV Area (PHT): 1.56 cm     SHUNTS MV  Decel Time: 486 msec     Systemic VTI:  0.31 m MV E velocity: 65.50 cm/s   Systemic Diam: 1.90 cm MV A velocity: 111.00 cm/s MV E/A ratio:  0.59 Jenkins Rouge MD Electronically signed by Jenkins Rouge MD Signature Date/Time: 10/06/2019/11:55:04 AM    Final     Scheduled Meds:  aspirin EC  81 mg Oral Daily   Chlorhexidine Gluconate Cloth  6 each Topical Daily   heparin  5,000 Units Subcutaneous Q8H   Continuous Infusions:  sodium chloride 100 mL/hr at 10/07/19 0657     LOS: 2 days   Time spent: 35 minutes.  Patrecia Pour, MD [facility] [email or web address] 10/07/2019, 3:15 PM

## 2019-10-07 NOTE — Consult Note (Addendum)
[facility] Nephrology Consultation Note  Requesting MD: Dr Bonner Puna, R Reason for consult: AKI on CKD  HPI:  Jeffrey Payne is a 86 y.o. male with history of hypertension, HLD, MGUS followed by oncologist, CHF, left renal cyst, CKD stage IV with baseline creatinine level around 2.8 followed by Dr. Marval Regal at [facility], subnephrotic proteinuria presented to ER on 6/11 for lethargy and not feeling well, seen as a consultation at the request of Dr. Bonner Puna for AKI on CKD. Patient had kidney biopsy done on 09/23/2016 with finding of hypertensive nephrosclerosis, severe arteriosclerosis, severe HCl fibrosis and tubular atrophy.  The last creatinine level was 2.97 in 06/2019 and 2.83 in 08/2019 at outpatient.  He was prescribed Lasix 60 mg every day and was on olmesartan 40 mg every day.  Apparently patient was taking Lasix 80 mg every day.  His weakness was gradually worsening and overall he was not feeling well. In the ER, blood pressure was 152/72, maintaining oxygen saturation.  The initial creatinine level was 3.56, BUN 76, potassium 4.9 on admission.  The urinalysis has subnephrotic proteinuria.  The kidney ultrasound showed a known large left renal cyst and cortical thinning on the right kidney. Patient was treated with IV hydration with gradual improvement in BUN and creatinine level.  Both Lasix and olmesartan were held.  Today, the creatinine level is 3.25 and BUN 59. He reports feeling much better however he still having some residual weakness.  He is working with physical therapist.  Denies nausea, vomiting, abdomen pain, chest pain, shortness of breath.  He has issue with urinary retention therefore Foley catheter was placed. His wife was at bedside.  Creatinine, Ser  Date/Time Value Ref Range Status  10/07/2019 03:34 AM 3.25 (H) 0.61 - 1.24 mg/dL Final  10/06/2019 03:10 AM 3.37 (H) 0.61 - 1.24 mg/dL Final  10/05/2019 06:56 PM 3.42 (H) 0.61 - 1.24 mg/dL Final  10/05/2019 01:49 PM 3.56  (H) 0.61 - 1.24 mg/dL Final  08/01/2017 10:45 AM 2.23 (H) 0.70 - 1.30 mg/dL Final  11/23/2011 10:09 AM 1.81 (H) 0.50 - 1.35 mg/dL Final  05/27/2011 12:27 PM 1.73 (H) 0.50 - 1.35 mg/dL Final     PMHx:   Past Medical History:  Diagnosis Date  . CKD (chronic kidney disease) stage 3, GFR 30-59 ml/min   . ED (erectile dysfunction)   . GERD (gastroesophageal reflux disease)   . Heart murmur   . Hypertension   . Hypertriglyceridemia   . Rosacea     History reviewed. No pertinent surgical history.  Family Hx:  Family History  Problem Relation Age of Onset  . Cancer Mother     Social History:  reports that he has never smoked. He has never used smokeless tobacco. He reports current alcohol use. He reports that he does not use drugs.  Allergies: No Known Allergies  Medications: Prior to Admission medications   Medication Sig Start Date End Date Taking? Authorizing Provider  acetaminophen (TYLENOL) 650 MG CR tablet Take 650 mg by mouth every 8 (eight) hours as needed for pain.    Yes [provider]  amLODipine (NORVASC) 10 MG tablet Take 10 mg by mouth daily.   Yes [provider]  aspirin 81 MG tablet Take 81 mg by mouth every evening.    Yes [provider]  calcitRIOL (ROCALTROL) 0.25 MCG capsule Take 0.25 mcg by mouth daily.  08/20/19  Yes [provider]  cetirizine (ZYRTEC) 10 MG tablet Take 10 mg by mouth daily.  Yes [provider]  diclofenac Sodium (VOLTAREN) 1 % GEL Apply 2 g topically 4 (four) times daily as needed (pain).   Yes [provider]  fish oil-omega-3 fatty acids 1000 MG capsule Take 1,000 mg by mouth daily.    Yes [provider]  fluticasone (FLONASE) 50 MCG/ACT nasal spray Place 1 spray into the nose daily as needed for allergies.    Yes [provider]  furosemide (LASIX) 40 MG tablet Take 80 mg by mouth daily.    Yes [provider]  Glucosamine-Chondroitin (GLUCOSAMINE  CHONDR COMPLEX PO) Take 750 mg by mouth 2 (two) times daily.   Yes [provider]  hydrALAZINE (APRESOLINE) 25 MG tablet Take 25 mg by mouth in the morning and at bedtime.  08/15/19  Yes [provider]  metoprolol succinate (TOPROL-XL) 50 MG 24 hr tablet Take 50 mg by mouth every evening.  05/27/14  Yes [provider]  olmesartan (BENICAR) 40 MG tablet Take 40 mg by mouth every morning. 07/17/19  Yes [provider]  terazosin (HYTRIN) 10 MG capsule Take 10 mg by mouth every morning.    Yes [provider]  Menthol-Methyl Salicylate (MUSCLE RUB EX) Apply 1 application topically daily as needed. Patient not taking: Reported on 10/05/2019    [provider]  ranitidine (ZANTAC) 150 MG tablet Take 150 mg by mouth 2 (two) times daily. Patient not taking: Reported on 10/05/2019    [provider]    I have reviewed the patient's current medications.  Labs:  Results for orders placed or performed during the hospital encounter of 10/05/19 (from the past 48 hour(s))  SARS Coronavirus 2 by RT PCR (hospital order, performed in [facility] hospital lab) Nasopharyngeal Nasopharyngeal Swab     Status: None   Collection Time: 10/05/19  3:12 PM   Specimen: Nasopharyngeal Swab  Result Value Ref Range   SARS Coronavirus 2 NEGATIVE NEGATIVE    Comment: (NOTE) SARS-CoV-2 target nucleic acids are NOT DETECTED.  The SARS-CoV-2 RNA is generally detectable in upper and lower respiratory specimens during the acute phase of infection. The lowest concentration of SARS-CoV-2 viral copies this assay can detect is 250 copies / mL. A negative result does not preclude SARS-CoV-2 infection and should not be used as the sole basis for treatment or other patient management decisions.  A negative result may occur with improper specimen collection / handling, submission of specimen other than nasopharyngeal swab, presence of viral mutation(s) within the areas  targeted by this assay, and inadequate number of viral copies (<250 copies / mL). A negative result must be combined with clinical observations, patient history, and epidemiological information.  Fact Sheet for Patients:   StrictlyIdeas.no  Fact Sheet for Healthcare Providers: BankingDealers.co.za  This test is not yet approved or  cleared by the [place] FDA and has been authorized for detection and/or diagnosis of SARS-CoV-2 by FDA under an Emergency Use Authorization (EUA).  This EUA will remain in effect (meaning this test can be used) for the duration of the COVID-19 declaration under Section 564(b)(1) of the Act, 21 U.S.C. section 360bbb-3(b)(1), unless the authorization is terminated or revoked sooner.  Performed at [facility], [place] [address], [place], [place] [address]   Troponin I (High Sensitivity)     Status: None   Collection Time: 10/05/19  3:38 PM  Result Value Ref Range   Troponin I (High Sensitivity) 10 <18 ng/L    Comment: (NOTE) Elevated high sensitivity  troponin I (hsTnI) values and significant  changes across serial measurements may suggest ACS but many other  chronic and acute conditions are known to elevate hsTnI results.  Refer to the "Links" section for chest pain algorithms and additional  guidance. Performed at [facility], [place] [address] [place], [place] [address]   Troponin I (High Sensitivity)     Status: None   Collection Time: 10/05/19  6:56 PM  Result Value Ref Range   Troponin I (High Sensitivity) 12 <18 ng/L    Comment: (NOTE) Elevated high sensitivity troponin I (hsTnI) values and significant  changes across serial measurements may suggest ACS but many other  chronic and acute conditions are known to elevate hsTnI results.  Refer to the "Links" section for chest pain algorithms and additional  guidance. Performed at [facility],  [place] [address], [place], [place] [address]   CBC     Status: Abnormal   Collection Time: 10/05/19  6:56 PM  Result Value Ref Range   WBC 9.9 4.0 - 10.5 K/uL   RBC 3.90 (L) 4.22 - 5.81 MIL/uL   Hemoglobin 12.0 (L) 13.0 - 17.0 g/dL   HCT 37.7 (L) 39 - 52 %   MCV 96.7 80.0 - 100.0 fL   MCH 30.8 26.0 - 34.0 pg   MCHC 31.8 30.0 - 36.0 g/dL   RDW 13.8 11.5 - 15.5 %   Platelets 295 150 - 400 K/uL   nRBC 0.0 0.0 - 0.2 %    Comment: Performed at [facility], [place] [address], [place], [place] [address]  Creatinine, serum     Status: Abnormal   Collection Time: 10/05/19  6:56 PM  Result Value Ref Range   Creatinine, Ser 3.42 (H) 0.61 - 1.24 mg/dL   GFR calc non Af Amer 16 (L) >60 mL/min   GFR calc Af Amer 18 (L) >60 mL/min    Comment: Performed at [facility], [place] [address] [place], [place] [address]  TSH     Status: None   Collection Time: 10/05/19  6:56 PM  Result Value Ref Range   TSH 1.876 0.350 - 4.500 uIU/mL    Comment: Performed by a 3rd Generation assay with a functional sensitivity of <=0.01 uIU/mL. Performed at [facility], [place] [address], [place] [address]   Urine culture     Status: None   Collection Time: 10/05/19  6:56 PM   Specimen: Urine, Random  Result Value Ref Range   Specimen Description      URINE, RANDOM Performed at [place] [address], [place], [place] [address]    Special Requests      NONE Performed at [facility], [place] [address] [place], [place] [address]    Culture      NO GROWTH Performed at [facility], [place] [address] [place], [place] [address]    Report Status 10/06/2019 FINAL   Comprehensive metabolic panel     Status: Abnormal   Collection Time: 10/06/19  3:10 AM  Result Value Ref Range   Sodium 136 135 - 145 mmol/L   Potassium 4.6 3.5 - 5.1 mmol/L   Chloride 102 98 - 111 mmol/L   CO2 22 22 - 32 mmol/L   Glucose,  Bld 140 (H) 70 - 99 mg/dL    Comment: Glucose reference range applies only to samples taken after fasting for at least 8 hours.   BUN  69 (H) 8 - 23 mg/dL   Creatinine, Ser 3.37 (H) 0.61 - 1.24 mg/dL   Calcium 9.7 8.9 - 10.3 mg/dL   Total Protein 6.5 6.5 - 8.1 g/dL   Albumin 3.4 (L) 3.5 - 5.0 g/dL   AST 12 (L) 15 - 41 U/L   ALT 13 0 - 44 U/L   Alkaline Phosphatase 77 38 - 126 U/L   Total Bilirubin 0.9 0.3 - 1.2 mg/dL   GFR calc non Af Amer 16 (L) >60 mL/min   GFR calc Af Amer 18 (L) >60 mL/min   Anion gap 12 5 - 15    Comment: Performed at [facility], [place] [address], [place], [place] [address]  Magnesium     Status: Abnormal   Collection Time: 10/06/19  3:10 AM  Result Value Ref Range   Magnesium 2.5 (H) 1.7 - 2.4 mg/dL    Comment: Performed at [facility], [place] [address] [place], [place] [address]  Phosphorus     Status: None   Collection Time: 10/06/19  3:10 AM  Result Value Ref Range   Phosphorus 3.7 2.5 - 4.6 mg/dL    Comment: Performed at [facility], [place] [address], [place], [place] [address]  CBC WITH DIFFERENTIAL     Status: Abnormal   Collection Time: 10/06/19  3:10 AM  Result Value Ref Range   WBC 12.6 (H) 4.0 - 10.5 K/uL   RBC 3.75 (L) 4.22 - 5.81 MIL/uL   Hemoglobin 11.3 (L) 13.0 - 17.0 g/dL   HCT 35.7 (L) 39 - 52 %   MCV 95.2 80.0 - 100.0 fL   MCH 30.1 26.0 - 34.0 pg   MCHC 31.7 30.0 - 36.0 g/dL   RDW 13.7 11.5 - 15.5 %   Platelets 301 150 - 400 K/uL   nRBC 0.0 0.0 - 0.2 %   Neutrophils Relative % 70 %   Neutro Abs 9.0 (H) 1.7 - 7.7 K/uL   Lymphocytes Relative 15 %   Lymphs Abs 1.8 0.7 - 4.0 K/uL   Monocytes Relative 10 %   Monocytes Absolute 1.3 (H) 0 - 1 K/uL   Eosinophils Relative 3 %   Eosinophils Absolute 0.3 0 - 0 K/uL   Basophils Relative 1 %   Basophils Absolute 0.1 0 - 0 K/uL   Immature Granulocytes 1 %   Abs Immature Granulocytes 0.07 0.00 - 0.07 K/uL    Comment: Performed at [facility], [place] [address] [place], [place] [address]  Protein / creatinine ratio, urine     Status: Abnormal   Collection Time: 10/06/19  6:29 PM  Result Value Ref Range   Creatinine, Urine 76.06 mg/dL   Total Protein, Urine 95 mg/dL    Comment: NO NORMAL RANGE ESTABLISHED FOR THIS TEST   Protein Creatinine Ratio 1.25 (H) 0.00 - 0.15 mg/mg[Cre]    Comment: Performed at [facility], [place] [address] [place], [place] [address]  Sodium, urine, random     Status: None   Collection Time: 10/06/19  6:29 PM  Result Value Ref Range   Sodium, Ur 41 mmol/L    Comment: Performed at [facility], [place] [address], [place], [place] [address]  Comprehensive metabolic panel     Status: Abnormal   Collection Time: 10/07/19  3:34 AM  Result Value Ref Range   Sodium 138 135 - 145 mmol/L   Potassium 4.5 3.5 - 5.1 mmol/L   Chloride  108 98 - 111 mmol/L   CO2 23 22 - 32 mmol/L   Glucose, Bld 94 70 - 99 mg/dL    Comment: Glucose reference range applies only to samples taken after fasting for at least 8 hours.   BUN 59 (H) 8 - 23 mg/dL   Creatinine, Ser 3.25 (H) 0.61 - 1.24 mg/dL   Calcium 9.0 8.9 - 10.3 mg/dL   Total Protein 5.6 (L) 6.5 - 8.1 g/dL   Albumin 2.9 (L) 3.5 - 5.0 g/dL   AST 12 (L) 15 - 41 U/L   ALT 11 0 - 44 U/L   Alkaline Phosphatase 64 38 - 126 U/L   Total Bilirubin 0.7 0.3 - 1.2 mg/dL   GFR calc non Af Amer 17 (L) >60 mL/min   GFR calc Af Amer 19 (L) >60 mL/min   Anion gap 7 5 - 15    Comment: Performed at [facility], [place] [address], [place], [place] [address]  Magnesium     Status: None   Collection Time: 10/07/19  3:34 AM  Result Value Ref Range   Magnesium 2.4 1.7 - 2.4 mg/dL    Comment: Performed at [facility], [place] [address] [place], [place] [address]  Phosphorus     Status: None   Collection Time: 10/07/19  3:34 AM  Result Value Ref Range   Phosphorus 3.6 2.5 - 4.6 mg/dL     Comment: Performed at [facility], [place] [address] [place], [place] [address]  CBC WITH DIFFERENTIAL     Status: Abnormal   Collection Time: 10/07/19  3:34 AM  Result Value Ref Range   WBC 9.3 4.0 - 10.5 K/uL   RBC 3.20 (L) 4.22 - 5.81 MIL/uL   Hemoglobin 9.8 (L) 13.0 - 17.0 g/dL   HCT 31.6 (L) 39 - 52 %   MCV 98.8 80.0 - 100.0 fL   MCH 30.6 26.0 - 34.0 pg   MCHC 31.0 30.0 - 36.0 g/dL   RDW 14.0 11.5 - 15.5 %   Platelets 272 150 - 400 K/uL   nRBC 0.0 0.0 - 0.2 %   Neutrophils Relative % 52 %   Neutro Abs 4.9 1.7 - 7.7 K/uL   Lymphocytes Relative 24 %   Lymphs Abs 2.2 0.7 - 4.0 K/uL   Monocytes Relative 15 %   Monocytes Absolute 1.3 (H) 0 - 1 K/uL   Eosinophils Relative 8 %   Eosinophils Absolute 0.8 (H) 0 - 0 K/uL   Basophils Relative 1 %   Basophils Absolute 0.1 0 - 0 K/uL   Immature Granulocytes 0 %   Abs Immature Granulocytes 0.03 0.00 - 0.07 K/uL    Comment: Performed at [facility], [place] [address] [place] [address], [place], [place] [address]  Procalcitonin     Status: None   Collection Time: 10/07/19  3:34 AM  Result Value Ref Range   Procalcitonin <0.10 ng/mL    Comment:        Interpretation: PCT (Procalcitonin) <= 0.5 ng/mL: Systemic infection (sepsis) is not likely. Local bacterial infection is possible. (NOTE)       Sepsis PCT Algorithm           Lower Respiratory Tract                                      Infection PCT Algorithm    ----------------------------     ----------------------------  PCT < 0.25 ng/mL                PCT < 0.10 ng/mL          Strongly encourage             Strongly discourage   discontinuation of antibiotics    initiation of antibiotics    ----------------------------     -----------------------------       PCT 0.25 - 0.50 ng/mL            PCT 0.10 - 0.25 ng/mL               OR       >80% decrease in PCT            Discourage initiation of                                            antibiotics       Encourage discontinuation           of antibiotics    ----------------------------     -----------------------------         PCT >= 0.50 ng/mL              PCT 0.26 - 0.50 ng/mL               AND        <80% decrease in PCT             Encourage initiation of                                             antibiotics       Encourage continuation           of antibiotics    ----------------------------     -----------------------------        PCT >= 0.50 ng/mL                  PCT > 0.50 ng/mL               AND         increase in PCT                  Strongly encourage                                      initiation of antibiotics    Strongly encourage escalation           of antibiotics                                     -----------------------------                                           PCT <= 0.25 ng/mL  OR                                        > 80% decrease in PCT                                      Discontinue / Do not initiate                                             antibiotics  Performed at [place] [address] [place], [place] [address]      ROS:  Pertinent items noted in HPI and remainder of comprehensive ROS otherwise negative.  Physical Exam: Vitals:   10/07/19 0500 10/07/19 1345  BP: (!) 150/48 (!) 150/43  Pulse: 61 60  Resp: 18 16  Temp: 99 F (37.2 C) 97.7 F (36.5 C)  SpO2: 96% 91%     General exam: Appears calm and comfortable  Respiratory system: Clear to auscultation. Respiratory effort normal. No wheezing or crackle Cardiovascular system: S1 & S2 heard, RRR.  No pedal edema. Gastrointestinal system: Abdomen is nondistended, soft and nontender. Normal bowel sounds heard. Central nervous system: Alert and oriented. No focal neurological deficits. Extremities: Symmetric 5 x 5 power. Skin: No rashes, lesions or ulcers Psychiatry: Judgement and insight appear normal.  Mood & affect appropriate.   Assessment/Plan:  #Acute kidney injury on CKD stage IV likely hemodynamically mediated because of decreased oral intake concomitant with the use of diuretics and olmesartan.  He also had acute urinary retention requiring insertion of Foley catheter.  He was taking Lasix higher than prescribed dose.  CKD is caused by biopsy-proven hypertensive nephrosclerosis. The urinalysis with subnephrotic range proteinuria which is chronic.  Kidney ultrasound ruled out obstruction. The BUN and creatinine level trending down with IV hydration.  Lasix and olmesartan on hold. May discontinue IV fluid tomorrow if labs continue to improve. No need for dialysis at the moment.  He follows closely at [facility].  #Generalized weakness/fatigue probably due to dehydration and multiple comorbidities.  Receiving supportive treatment and physical therapy.  #Hypertension: Blood pressure suboptimal.  Start low-dose amlodipine.  #Anemia of CKD: Hemoglobin at goal.  Drop in hemoglobin noted today unknown if it is due to dilution.  Follow-up lab.  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  He takes calcitriol 3 times a week at home.  Resume on discharge.  #MGUS: Follows with oncologist.  Thank you for the consult.  We will follow.   Taylen Wendland Tanna Furry 10/07/2019, 3:03 PM  [facility].

## 2019-10-07 NOTE — Progress Notes (Signed)
OT Cancellation Note  Patient Details Name: Jeffrey Payne MRN: [id number] DOB: 1932/11/28   Cancelled Treatment:    Reason Eval/Treat Not Completed: Patient at procedure or test/ unavailable. NT on way to get patient to take him down for procedure. Will check back as time allows for evaluation.  [place], OTR/L Acute Rehab Services Pager [phone] Office [phone]     Almon Register 10/07/2019, 10:56 AM

## 2019-10-08 LAB — CBC WITH DIFFERENTIAL/PLATELET
Abs Immature Granulocytes: 0.04 10*3/uL (ref 0.00–0.07)
Basophils Absolute: 0.1 10*3/uL (ref 0.0–0.1)
Basophils Relative: 1 %
Eosinophils Absolute: 0.6 10*3/uL — ABNORMAL HIGH (ref 0.0–0.5)
Eosinophils Relative: 7 %
HCT: 32.4 % — ABNORMAL LOW (ref 39.0–52.0)
Hemoglobin: 10.1 g/dL — ABNORMAL LOW (ref 13.0–17.0)
Immature Granulocytes: 1 %
Lymphocytes Relative: 29 %
Lymphs Abs: 2.6 10*3/uL (ref 0.7–4.0)
MCH: 30.8 pg (ref 26.0–34.0)
MCHC: 31.2 g/dL (ref 30.0–36.0)
MCV: 98.8 fL (ref 80.0–100.0)
Monocytes Absolute: 1.3 10*3/uL — ABNORMAL HIGH (ref 0.1–1.0)
Monocytes Relative: 14 %
Neutro Abs: 4.2 10*3/uL (ref 1.7–7.7)
Neutrophils Relative %: 48 %
Platelets: 245 10*3/uL (ref 150–400)
RBC: 3.28 MIL/uL — ABNORMAL LOW (ref 4.22–5.81)
RDW: 13.9 % (ref 11.5–15.5)
WBC: 8.8 10*3/uL (ref 4.0–10.5)
nRBC: 0 % (ref 0.0–0.2)

## 2019-10-08 LAB — COMPREHENSIVE METABOLIC PANEL
ALT: 12 U/L (ref 0–44)
AST: 13 U/L — ABNORMAL LOW (ref 15–41)
Albumin: 2.8 g/dL — ABNORMAL LOW (ref 3.5–5.0)
Alkaline Phosphatase: 60 U/L (ref 38–126)
Anion gap: 8 (ref 5–15)
BUN: 41 mg/dL — ABNORMAL HIGH (ref 8–23)
CO2: 20 mmol/L — ABNORMAL LOW (ref 22–32)
Calcium: 8.9 mg/dL (ref 8.9–10.3)
Chloride: 113 mmol/L — ABNORMAL HIGH (ref 98–111)
Creatinine, Ser: 2.44 mg/dL — ABNORMAL HIGH (ref 0.61–1.24)
GFR calc Af Amer: 27 mL/min — ABNORMAL LOW (ref >60)
GFR calc non Af Amer: 23 mL/min — ABNORMAL LOW (ref >60)
Glucose, Bld: 92 mg/dL (ref 70–99)
Potassium: 4.5 mmol/L (ref 3.5–5.1)
Sodium: 141 mmol/L (ref 135–145)
Total Bilirubin: 0.7 mg/dL (ref 0.3–1.2)
Total Protein: 5.4 g/dL — ABNORMAL LOW (ref 6.5–8.1)

## 2019-10-08 MED ORDER — AZITHROMYCIN 250 MG PO TABS
500.0000 mg | ORAL_TABLET | Freq: Once | ORAL | Status: AC
  Administered 2019-10-08: 500 mg via ORAL
  Filled 2019-10-08: qty 2

## 2019-10-08 MED ORDER — AZITHROMYCIN 250 MG PO TABS
250.0000 mg | ORAL_TABLET | Freq: Every day | ORAL | Status: DC
  Administered 2019-10-09: 250 mg via ORAL
  Filled 2019-10-08: qty 1

## 2019-10-08 MED ORDER — SODIUM CHLORIDE 0.9 % IV SOLN
1.0000 g | INTRAVENOUS | Status: DC
  Administered 2019-10-08: 1 g via INTRAVENOUS
  Filled 2019-10-08 (×2): qty 10

## 2019-10-08 NOTE — Plan of Care (Signed)
?  Problem: Clinical Measurements: ?Goal: Ability to maintain clinical measurements within normal limits will improve ?Outcome: Progressing ?Goal: Will remain free from infection ?Outcome: Progressing ?Goal: Diagnostic test results will improve ?Outcome: Progressing ?  ?

## 2019-10-08 NOTE — Progress Notes (Signed)
Physical Therapy Treatment and Discharge from Acute PT Patient Details Name: Jeffrey Payne MRN: [id number] DOB: 23-May-1932 Today's Date: 10/08/2019    History of Present Illness 86 y.o. male with a history of MGUS, stage IV CKD, HTN, hypertriglyceridemia who presented to the ED 6/11 with fatigue worsening to severe over several weeks associated with generalized weakness. Evaluation in the ED revealed evidence of renal impairment    PT Comments    Pt ambulated in hallway and likely near his baseline mobility.  Pt has met acute PT goals.  PT to sign off.   Follow Up Recommendations  No PT follow up     Equipment Recommendations  None recommended by PT    Recommendations for Other Services       Precautions / Restrictions Precautions Precautions: Fall    Mobility  Bed Mobility Overal bed mobility: Modified Independent                Transfers Overall transfer level: Needs assistance Equipment used: None Transfers: Sit to/from Stand Sit to Stand: Supervision            Ambulation/Gait Ambulation/Gait assistance: Supervision Gait Distance (Feet): 200 Feet Assistive device: None Gait Pattern/deviations: Step-through pattern;Decreased stride length;Wide base of support     General Gait Details: pt started attempting to hold handrail in hallway after 100 feet of ambulation however overall steady and no LOB observed   Stairs             Wheelchair Mobility    Modified Rankin (Stroke Patients Only)       Balance                                            Cognition Arousal/Alertness: Awake/alert Behavior During Therapy: WFL for tasks assessed/performed Overall Cognitive Status: Within Functional Limits for tasks assessed                                        Exercises      General Comments        Pertinent Vitals/Pain Pain Assessment: No/denies pain    Home Living                       Prior Function            PT Goals (current goals can now be found in the care plan section) Progress towards PT goals: Goals met/education completed, patient discharged from PT    Frequency    Min 3X/week      PT Plan Current plan remains appropriate    Co-evaluation              AM-PAC PT "6 Clicks" Mobility   Outcome Measure  Help needed turning from your back to your side while in a flat bed without using bedrails?: None Help needed moving from lying on your back to sitting on the side of a flat bed without using bedrails?: None Help needed moving to and from a bed to a chair (including a wheelchair)?: None Help needed standing up from a chair using your arms (e.g., wheelchair or bedside chair)?: A Little Help needed to walk in hospital room?: A Little Help needed climbing 3-5 steps with a railing? : A Little 6 Click Score: 21  End of Session Equipment Utilized During Treatment: Gait belt Activity Tolerance: Patient tolerated treatment well Patient left: in bed;with call bell/phone within reach;with bed alarm set   PT Visit Diagnosis: Difficulty in walking, not elsewhere classified (R26.2)     Time: 5001-6429 PT Time Calculation (min) (ACUTE ONLY): 8 min  Charges:  $Gait Training: 8-22 mins                    Arlyce Dice, DPT Acute Rehabilitation Services Pager: [phone] Office: [phone]  York Ram E 10/08/2019, 12:45 PM

## 2019-10-08 NOTE — Progress Notes (Signed)
PROGRESS NOTE  Dresden Lozito  HYI:[id number] DOB: 03-Sep-1932 DOA: 10/05/2019 PCP: Gavin Pound, MD  Outpatient Specialists: Nephrology, Dr. Marval Regal; Oncology, Dr. Alen Blew. Brief Narrative: Alvy Alsop is an 86 y.o. male with a history of MGUS, stage IV CKD, HTN, hypertriglyceridemia who presented to the ED 6/11 with fatigue worsening to severe over several weeks associated with generalized weakness. Evaluation in the ED revealed evidence of renal impairment, SCr 3.56, BUN 76, elevated BNP to 610 and normal troponin x3. Hgb noted to be 12, normocytic, in the absence of bleeding by history or exam. Dipstick urinalysis was unremarkable.   Assessment & Plan: Principal Problem:   Acute kidney injury superimposed on CKD ([place]) Active Problems:   Hypertension   Heart murmur   CKD (chronic kidney disease), stage IV (HCC)   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HCC)   Bradycardia   Hypotension   Fatigue  Fatigue: No actual focal weakness on exam, and other than tiring quickly, he remains functionally independent. PT/OT have signed off.  - Treat conditions below  AKI on stage IV CKD vs. progression of CKD: Biopsy-proven hypertensive nephrosclerosis. - UA was unremarkable. Renal U/S consistent with medical renal disease without hydronephrosis. FENa 1.27% suggestive of intrarenal etiology due to ARB, hemodynamics (hypotension due to dehydration), though may have had some acute obstructive uropathy improved with foley and an element of prerenal azotemia w/improvement from IVF - Nephrology consulted for further recommendations. SCr improved 6/14 with IV fluids. DC IVF and push po intake. Recheck in AM, still holding diuretic unless otherwise recommended by nephrology. - Avoid hypotension - Avoid NSAIDs, ARB, nephrotoxins  Acute urinary retention: Recurrent problem per pt and wife, has seen Dr. Raynelle Bring for prostate enlargement in the past. - Foley placed 6/12 for recurrent retention, voiding  trial 6/14. He is retaining just under 300cc post void.  - Started flomax. Will continue monitoring for retention and if 300cc PVR noted, reinsert foley. Recommend urology follow up.   History of grade 2 diastolic dysfunction, cardiomegaly: ECG with abnormal axis, mild 1st degree AVB, no ST deviations. Troponin negative x3  - Echocardiogram ordered and pending. Does not appear to have clinical heart failure at this time, though BNP is elevated.  HTN:  - Holding BP medications with soft BP. Holding ARB, metoprolol, norvasc 10mg , lasix 40mg  po daily.  - BP persistently elevated, so 5mg  norvasc restarted. HR sustaining in 50's, so holding metoprolol.  RLL opacity: Noted on CXR and characteristic of infectious etiology on CT chest.  - Will empirically treat for pneumonia x5 days. CTX, azithromycin. Recommend repeat CXR in 4-6 weeks.  - Incentive spirometry, flutter valve.  IgM MGUS Dx 2013: Was under surveillance with Dr. Alen Blew, though last visit was April 2019. - Will discuss with oncology if acute issues arise.   Anemia of chronic kidney disease: Stable on recheck.    DVT prophylaxis: Heparin Code Status: Full Family Communication: Wife at bedside this AM and later by phone Disposition Plan:  Status is: Inpatient  Remains inpatient appropriate because:Persistent severe electrolyte disturbances and Ongoing diagnostic testing needed not appropriate for outpatient work up   Dispo: The patient is from: Home              Anticipated d/c is to: TBD              Anticipated d/c date is: 3 days              Patient currently is not medically stable to d/c.  Consultants:   None  Procedures:   None  Antimicrobials:  None   Subjective: Feels slightly less fatigued, but still sleeping soundly both day and night. Getting up with PT and appears steady, able to ambulate the entire nursing floor. Retained 275cc this PM after voiding trial. No fevers or new  complaints.  Objective: Vitals:   10/07/19 2100 10/08/19 0544 10/08/19 0551 10/08/19 1156  BP: (!) 163/48 (!) 160/42  (!) 166/42  Pulse: 63 61  (!) 59  Resp: 18 18  18   Temp: 98.9 F (37.2 C) 98.4 F (36.9 C)  98.4 F (36.9 C)  TempSrc: Oral Oral  Oral  SpO2: 92% 92%  94%  Weight:   62 kg   Height:        Intake/Output Summary (Last 24 hours) at 10/08/2019 1440 Last data filed at 10/08/2019 1426 Gross per 24 hour  Intake 2469.08 ml  Output 1550 ml  Net 919.08 ml   Filed Weights   10/05/19 2300 10/07/19 0500 10/08/19 0551  Weight: 60.7 kg 60.5 kg 62 kg   Gen: 86 y.o. male in no distress. Elderly and tired. Pulm: Nonlabored breathing room air. Crackles at right base persist. CV: Regular rate and rhythm. No murmur, rub, or gallop. No JVD, no dependent edema. GI: Abdomen soft, non-tender, non-distended, with normoactive bowel sounds.  Ext: Warm, no deformities Skin: No rashes, lesions or ulcers on visualized skin. Neuro: Alert and oriented. No focal neurological deficits. Psych: Judgement and insight appear fair. Mood euthymic & affect congruent. Behavior is appropriate.    Data Reviewed: I have personally reviewed following labs and imaging studies  CBC: Recent Labs  Lab 10/05/19 1349 10/05/19 1856 10/06/19 0310 10/07/19 0334 10/08/19 0414  WBC 9.9 9.9 12.6* 9.3 8.8  NEUTROABS 5.1  --  9.0* 4.9 4.2  HGB 12.0* 12.0* 11.3* 9.8* 10.1*  HCT 37.2* 37.7* 35.7* 31.6* 32.4*  MCV 95.1 96.7 95.2 98.8 98.8  PLT 280 295 301 272 481   Basic Metabolic Panel: Recent Labs  Lab 10/05/19 1349 10/05/19 1856 10/06/19 0310 10/07/19 0334 10/08/19 0414  NA 134*  --  136 138 141  K 4.9  --  4.6 4.5 4.5  CL 100  --  102 108 113*  CO2 23  --  22 23 20*  GLUCOSE 165*  --  140* 94 92  BUN 76*  --  69* 59* 41*  CREATININE 3.56* 3.42* 3.37* 3.25* 2.44*  CALCIUM 9.5  --  9.7 9.0 8.9  MG  --   --  2.5* 2.4  --   PHOS  --   --  3.7 3.6  --    GFR: Estimated Creatinine Clearance:  19.8 mL/min (A) (by C-G formula based on SCr of 2.44 mg/dL (H)). Liver Function Tests: Recent Labs  Lab 10/05/19 1349 10/06/19 0310 10/07/19 0334 10/08/19 0414  AST 13* 12* 12* 13*  ALT 14 13 11 12   ALKPHOS 78 77 64 60  BILITOT 0.5 0.9 0.7 0.7  PROT 6.4* 6.5 5.6* 5.4*  ALBUMIN 3.3* 3.4* 2.9* 2.8*   No results for input(s): LIPASE, AMYLASE in the last 168 hours. No results for input(s): AMMONIA in the last 168 hours. Coagulation Profile: No results for input(s): INR, PROTIME in the last 168 hours. Cardiac Enzymes: No results for input(s): CKTOTAL, CKMB, CKMBINDEX, TROPONINI in the last 168 hours. BNP (last 3 results) No results for input(s): PROBNP in the last 8760 hours. HbA1C: No results for input(s): HGBA1C in the last 72 hours. CBG: No results for  input(s): GLUCAP in the last 168 hours. Lipid Profile: No results for input(s): CHOL, HDL, LDLCALC, TRIG, CHOLHDL, LDLDIRECT in the last 72 hours. Thyroid Function Tests: Recent Labs    10/05/19 1856  TSH 1.876   Anemia Panel: No results for input(s): VITAMINB12, FOLATE, FERRITIN, TIBC, IRON, RETICCTPCT in the last 72 hours. Urine analysis:    Component Value Date/Time   COLORURINE STRAW (A) 10/05/2019 1338   APPEARANCEUR CLEAR 10/05/2019 1338   LABSPEC 1.005 10/05/2019 1338   PHURINE 7.0 10/05/2019 1338   GLUCOSEU NEGATIVE 10/05/2019 1338   HGBUR NEGATIVE 10/05/2019 1338   BILIRUBINUR NEGATIVE 10/05/2019 1338   KETONESUR NEGATIVE 10/05/2019 1338   PROTEINUR NEGATIVE 10/05/2019 1338   NITRITE NEGATIVE 10/05/2019 1338   LEUKOCYTESUR NEGATIVE 10/05/2019 1338   Recent Results (from the past 240 hour(s))  SARS Coronavirus 2 by RT PCR (hospital order, performed in [facility] hospital lab) Nasopharyngeal Nasopharyngeal Swab     Status: None   Collection Time: 10/05/19  3:12 PM   Specimen: Nasopharyngeal Swab  Result Value Ref Range Status   SARS Coronavirus 2 NEGATIVE NEGATIVE Final    Comment: (NOTE) SARS-CoV-2  target nucleic acids are NOT DETECTED.  The SARS-CoV-2 RNA is generally detectable in upper and lower respiratory specimens during the acute phase of infection. The lowest concentration of SARS-CoV-2 viral copies this assay can detect is 250 copies / mL. A negative result does not preclude SARS-CoV-2 infection and should not be used as the sole basis for treatment or other patient management decisions.  A negative result may occur with improper specimen collection / handling, submission of specimen other than nasopharyngeal swab, presence of viral mutation(s) within the areas targeted by this assay, and inadequate number of viral copies (<250 copies / mL). A negative result must be combined with clinical observations, patient history, and epidemiological information.  Fact Sheet for Patients:   StrictlyIdeas.no  Fact Sheet for Healthcare Providers: BankingDealers.co.za  This test is not yet approved or  cleared by the [place] FDA and has been authorized for detection and/or diagnosis of SARS-CoV-2 by FDA under an Emergency Use Authorization (EUA).  This EUA will remain in effect (meaning this test can be used) for the duration of the COVID-19 declaration under Section 564(b)(1) of the Act, 21 U.S.C. section 360bbb-3(b)(1), unless the authorization is terminated or revoked sooner.  Performed at [facility], [place] [address] [place], [place] [address]   Urine culture     Status: None   Collection Time: 10/05/19  6:56 PM   Specimen: Urine, Random  Result Value Ref Range Status   Specimen Description   Final    URINE, RANDOM Performed at [place] [address], [place], [place] [address]    Special Requests   Final    NONE Performed at [facility], [place] [address] [place], [place] [address]    Culture   Final    NO GROWTH Performed at [facility],  [place] [address], [place],  [address]    Report Status 10/06/2019 FINAL  Final      Radiology Studies: CT CHEST WO CONTRAST  Result Date: 10/07/2019 CLINICAL DATA:  86 year old male with abnormal chest x-rays yesterday. Fatigue and shortness of breath. EXAM: CT CHEST WITHOUT CONTRAST TECHNIQUE: Multidetector CT imaging of the chest was performed following the standard protocol without IV contrast. COMPARISON:  Chest radiographs yesterday and earlier. FINDINGS: Cardiovascular: Calcified aortic atherosclerosis. Calcified coronary artery atherosclerosis. Vascular patency  is not evaluated in the absence of IV contrast. Cardiac size at the upper limits of normal. Small volume of pericardial fluid could be physiologic (series 2, image 114). Mediastinum/Nodes: Negative.  No lymphadenopathy. Lungs/Pleura: Trace bilateral dependent pleural fluid. There is a small focus of posterior right pleural calcification in the lower lobe. Major airways are patent. Coarsely calcified left upper lobe granuloma with minor regional scarring. Much smaller calcified right upper lobe granuloma on series 5, image 27. Asymmetric right middle and lower lobe subpleural reticular opacity. Several superimposed small calcified right lower lobe granulomas. Negative left lung base. Upper Abdomen: Negative visible noncontrast liver, gallbladder, spleen, pancreas, adrenal glands and kidneys. Moderate distension of the stomach with gas and food debris. Other visible bowel loops appear negative. Musculoskeletal: Generally mild for age degenerative changes. There is a conspicuous thoracic partially calcified disc herniation at T8-T9. No acute osseous abnormality identified. IMPRESSION: 1. Right middle and lower lobe subpleural reticular opacity is nonspecific but suspicious for acute distal airway infection. Asymmetric interstitial lung disease is also possible. 2. Trace bilateral pleural fluid. Trace right pleural calcification. 3. Small  pericardial effusion, could be physiologic. 4. Multiple pulmonary calcified granulomas, the largest in the left upper lobe. 5. Calcified coronary artery and Aortic atherosclerosis (ICD10-I70.0). Electronically Signed   By: Genevie Ann M.D.   On: 10/07/2019 13:35    Scheduled Meds: . amLODipine  5 mg Oral Daily  . aspirin EC  81 mg Oral Daily  . heparin  5,000 Units Subcutaneous Q8H  . tamsulosin  0.4 mg Oral QPC supper   Continuous Infusions: . cefTRIAXone (ROCEPHIN)  IV 1 g (10/08/19 1235)     LOS: 3 days   Time spent: 35 minutes.  Patrecia Pour, MD [facility] [email or web address] 10/08/2019, 2:40 PM

## 2019-10-08 NOTE — Care Management Important Message (Signed)
Important Message  Patient Details IM Letter given to Gabriel Earing RN Case Manager to present to the Patient Name: Jeffrey Payne MRN: [id number] Date of Birth: 1932-06-29   Medicare Important Message Given:  Yes     Kerin Salen 10/08/2019, 11:50 AM

## 2019-10-08 NOTE — TOC Progression Note (Signed)
Transition of Care [facility]) - Progression Note    Patient Details  Name: Jeffrey Payne MRN: [id number] Date of Birth: [birth year]  [facility] CM/SW Contact  Purcell Mouton, RN Phone Number: 10/08/2019, 2:46 PM  Clinical Narrative:    Pt plan to discharge home. TOC will follow for HH/DME needs.    Expected Discharge Plan: Home/Self Care Barriers to Discharge: No Barriers Identified  Expected Discharge Plan and Services Expected Discharge Plan: Home/Self Care       Living arrangements for the past 2 months: Single Family Home                                       Social Determinants of Health (SDOH) Interventions    Readmission Risk Interventions No flowsheet data found.

## 2019-10-08 NOTE — Progress Notes (Signed)
[facility] Progress Note  Subjective: feeling good, not many issues per patient  Vitals:   10/07/19 2100 10/08/19 0544 10/08/19 0551 10/08/19 1156  BP: (!) 163/48 (!) 160/42  (!) 166/42  Pulse: 63 61  (!) 59  Resp: 18 18  18   Temp: 98.9 F (37.2 C) 98.4 F (36.9 C)  98.4 F (36.9 C)  TempSrc: Oral Oral  Oral  SpO2: 92% 92%  94%  Weight:   62 kg   Height:        Exam: General exam: Appears calm and comfortable  Respiratory system: Clear to auscultation. Respiratory effort normal. No wheezing or crackle Cardiovascular system: S1 & S2 heard, RRR.  No pedal edema. Gastrointestinal system: Abdomen is nondistended, soft and nontender. Normal bowel sounds heard. Central nervous system: Alert and oriented. No focal neurological deficits. Extremities: Symmetric 5 x 5 power. Skin: No rashes, lesions or ulcers Psychiatry: Judgement and insight appear normal. Mood & affect appropriate.     Assessment/Plan:  #Acute kidney injury on CKD stage IV likely hemodynamically mediated because of decreased oral intake concomitant with the use of diuretics and olmesartan.  He also had acute urinary retention requiring insertion of Foley catheter.  He was taking Lasix higher than prescribed dose.  The urinalysis with subnephrotic range proteinuria which is chronic.  Kidney ultrasound ruled out obstruction. Underlying CKD was biopsy-proven hypertensive nephrosclerosis.   - Peak creat 3.4 > 3.2 and down to 2.5 today. Last 2 office visits Cr was   2.6- 2.9 so we are back in range w/ gentle fluids and holding lasix/ ARB  - would resume lasix at home in 3-4 days at 60 mg per day (give new Rx)  - resume the olmesartan in 1-2 weeks and dc the norvasc at same time  - have set him up for OP labs in 2 weeks, see DC F/U section    - pt already has f/u appt in 4 wks on 7/14 at [place]   - ok for dc when okay w/ pmd, will sign off   #Generalized weakness/fatigue probably due to dehydration and  multiple comorbidities.  Receiving supportive treatment and physical therapy.  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  He takes calcitriol 3 times a week at home.  Resume on discharge.  #MGUS: Follows with oncologist. Renal failure is not plasma cell related.     Rob Ember Gottwald 10/08/2019, 4:07 PM   Recent Labs  Lab 10/06/19 0310 10/06/19 0310 10/07/19 0334 10/08/19 0414  K 4.6   < > 4.5 4.5  BUN 69*   < > 59* 41*  CREATININE 3.37*   < > 3.25* 2.44*  CALCIUM 9.7   < > 9.0 8.9  PHOS 3.7  --  3.6  --   HGB 11.3*   < > 9.8* 10.1*   < > = values in this interval not displayed.   Inpatient medications: . amLODipine  5 mg Oral Daily  . aspirin EC  81 mg Oral Daily  . [START ON 10/09/2019] azithromycin  250 mg Oral Daily  . heparin  5,000 Units Subcutaneous Q8H  . tamsulosin  0.4 mg Oral QPC supper   . cefTRIAXone (ROCEPHIN)  IV 1 g (10/08/19 1235)   acetaminophen **OR** acetaminophen, ondansetron **OR** ondansetron (ZOFRAN) IV

## 2019-10-09 DIAGNOSIS — N179 Acute kidney failure, unspecified: Principal | ICD-10-CM

## 2019-10-09 DIAGNOSIS — N189 Chronic kidney disease, unspecified: Secondary | ICD-10-CM

## 2019-10-09 LAB — CBC WITH DIFFERENTIAL/PLATELET
Abs Immature Granulocytes: 0.04 10*3/uL (ref 0.00–0.07)
Basophils Absolute: 0.1 10*3/uL (ref 0.0–0.1)
Basophils Relative: 1 %
Eosinophils Absolute: 0.3 10*3/uL (ref 0.0–0.5)
Eosinophils Relative: 3 %
HCT: 30.9 % — ABNORMAL LOW (ref 39.0–52.0)
Hemoglobin: 9.7 g/dL — ABNORMAL LOW (ref 13.0–17.0)
Immature Granulocytes: 0 %
Lymphocytes Relative: 28 %
Lymphs Abs: 2.8 10*3/uL (ref 0.7–4.0)
MCH: 30.3 pg (ref 26.0–34.0)
MCHC: 31.4 g/dL (ref 30.0–36.0)
MCV: 96.6 fL (ref 80.0–100.0)
Monocytes Absolute: 1.3 10*3/uL — ABNORMAL HIGH (ref 0.1–1.0)
Monocytes Relative: 14 %
Neutro Abs: 5.3 10*3/uL (ref 1.7–7.7)
Neutrophils Relative %: 54 %
Platelets: 254 10*3/uL (ref 150–400)
RBC: 3.2 MIL/uL — ABNORMAL LOW (ref 4.22–5.81)
RDW: 13.8 % (ref 11.5–15.5)
WBC: 9.9 10*3/uL (ref 4.0–10.5)
nRBC: 0 % (ref 0.0–0.2)

## 2019-10-09 LAB — COMPREHENSIVE METABOLIC PANEL
ALT: 15 U/L (ref 0–44)
AST: 17 U/L (ref 15–41)
Albumin: 2.8 g/dL — ABNORMAL LOW (ref 3.5–5.0)
Alkaline Phosphatase: 64 U/L (ref 38–126)
Anion gap: 7 (ref 5–15)
BUN: 33 mg/dL — ABNORMAL HIGH (ref 8–23)
CO2: 21 mmol/L — ABNORMAL LOW (ref 22–32)
Calcium: 9.3 mg/dL (ref 8.9–10.3)
Chloride: 113 mmol/L — ABNORMAL HIGH (ref 98–111)
Creatinine, Ser: 2.33 mg/dL — ABNORMAL HIGH (ref 0.61–1.24)
GFR calc Af Amer: 29 mL/min — ABNORMAL LOW (ref >60)
GFR calc non Af Amer: 25 mL/min — ABNORMAL LOW (ref >60)
Glucose, Bld: 104 mg/dL — ABNORMAL HIGH (ref 70–99)
Potassium: 4.4 mmol/L (ref 3.5–5.1)
Sodium: 141 mmol/L (ref 135–145)
Total Bilirubin: 0.5 mg/dL (ref 0.3–1.2)
Total Protein: 5.5 g/dL — ABNORMAL LOW (ref 6.5–8.1)

## 2019-10-09 MED ORDER — CEFDINIR 300 MG PO CAPS: 300.0000 mg | ORAL_CAPSULE | Freq: Every day | ORAL | 0 refills | Status: DC

## 2019-10-09 MED ORDER — AZITHROMYCIN 250 MG PO TABS: 250.0000 mg | ORAL_TABLET | Freq: Every day | ORAL | 0 refills | Status: DC

## 2019-10-09 MED ORDER — FUROSEMIDE 20 MG PO TABS: 60.0000 mg | ORAL_TABLET | Freq: Every day | ORAL | 0 refills | Status: DC

## 2019-10-09 MED ORDER — TAMSULOSIN HCL 0.4 MG PO CAPS: 0.4000 mg | ORAL_CAPSULE | Freq: Every day | ORAL | 0 refills | Status: DC

## 2019-10-09 MED ORDER — CHLORHEXIDINE GLUCONATE CLOTH 2 % EX PADS: 6.0000 | MEDICATED_PAD | Freq: Every day | CUTANEOUS | Status: DC

## 2019-10-09 NOTE — Discharge Instructions (Signed)
Acute Kidney Injury, Adult  Acute kidney injury is a sudden worsening of kidney function. The kidneys are organs that have several jobs. They filter the blood to remove waste products and extra fluid. They also maintain a healthy balance of minerals and hormones in the body, which helps control blood pressure and keep bones strong. With this condition, your kidneys do not do their jobs as well as they should. This condition ranges from mild to severe. Over time it may develop into long-lasting (chronic) kidney disease. Early detection and treatment may prevent acute kidney injury from developing into a chronic condition. What are the causes? Common causes of this condition include:  A problem with blood flow to the kidneys. This may be caused by: ? Low blood pressure (hypotension) or shock. ? Blood loss. ? Heart and blood vessel (cardiovascular) disease. ? Severe burns. ? Liver disease.  Direct damage to the kidneys. This may be caused by: ? Certain medicines. ? A kidney infection. ? Poisoning. ? Being around or in contact with toxic substances. ? A surgical wound. ? A hard, direct hit to the kidney area.  A sudden blockage of urine flow. This may be caused by: ? Cancer. ? Kidney stones. ? An enlarged prostate in males. What are the signs or symptoms? Symptoms of this condition may not be obvious until the condition becomes severe. Symptoms of this condition can include:  Tiredness (lethargy), or difficulty staying awake.  Nausea or vomiting.  Swelling (edema) of the face, legs, ankles, or feet.  Problems with urination, such as: ? Abdominal pain, or pain along the side of your stomach (flank). ? Decreased urine production. ? Decrease in the force of urine flow.  Muscle twitches and cramps, especially in the legs.  Confusion or trouble concentrating.  Loss of appetite.  Fever. How is this diagnosed? This condition may be diagnosed with tests, including:  Blood  tests.  Urine tests.  Imaging tests.  A test in which a sample of tissue is removed from the kidneys to be examined under a microscope (kidney biopsy). How is this treated? Treatment for this condition depends on the cause and how severe the condition is. In mild cases, treatment may not be needed. The kidneys may heal on their own. In more severe cases, treatment will involve:  Treating the cause of the kidney injury. This may involve changing any medicines you are taking or adjusting your dosage.  Fluids. You may need specialized IV fluids to balance your body's needs.  Having a catheter placed to drain urine and prevent blockages.  Preventing problems from occurring. This may mean avoiding certain medicines or procedures that can cause further injury to the kidneys. In some cases treatment may also require:  A procedure to remove toxic wastes from the body (dialysis or continuous renal replacement therapy - CRRT).  Surgery. This may be done to repair a torn kidney, or to remove the blockage from the urinary system. Follow these instructions at home: Medicines  Take over-the-counter and prescription medicines only as told by your health care provider.  Do not take any new medicines without your health care provider's approval. Many medicines can worsen your kidney damage.  Do not take any vitamin and mineral supplements without your health care provider's approval. Many nutritional supplements can worsen your kidney damage. Lifestyle  If your health care provider prescribed changes to your diet, follow them. You may need to decrease the amount of protein you eat.  Achieve and maintain a healthy   weight. If you need help with this, ask your health care provider.  Start or continue an exercise plan. Try to exercise at least 30 minutes a day, 5 days a week.  Do not use any tobacco products, such as cigarettes, chewing tobacco, and e-cigarettes. If you need help quitting, ask your  health care provider. General instructions  Keep track of your blood pressure. Report changes in your blood pressure as told by your health care provider.  Stay up to date with immunizations. Ask your health care provider which immunizations you need.  Keep all follow-up visits as told by your health care provider. This is important. Where to find more information  American Association of Kidney Patients: BombTimer.gl  National Kidney Foundation: www.kidney.[place]: https://mathis.com/  Life Options Rehabilitation Program: ? www.lifeoptions.org ? www.kidneyschool.org Contact a health care provider if:  Your symptoms get worse.  You develop new symptoms. Get help right away if:  You develop symptoms of worsening kidney disease, which include: ? Headaches. ? Abnormally dark or light skin. ? Easy bruising. ? Frequent hiccups. ? Chest pain. ? Shortness of breath. ? End of menstruation in women. ? Seizures. ? Confusion or altered mental status. ? Abdominal or back pain. ? Itchiness.  You have a fever.  Your body is producing less urine.  You have pain or bleeding when you urinate. Summary  Acute kidney injury is a sudden worsening of kidney function.  Acute kidney injury can be caused by problems with blood flow to the kidneys, direct damage to the kidneys, and sudden blockage of urine flow.  Symptoms of this condition may not be obvious until it becomes severe. Symptoms may include edema, lethargy, confusion, nausea or vomiting, and problems passing urine.  This condition can usually be diagnosed with blood tests, urine tests, and imaging tests. Sometimes a kidney biopsy is done to diagnose this condition.  Treatment for this condition often involves treating the underlying cause. It is treated with fluids, medicines, dialysis, diet changes, or surgery. This information is not intended to replace advice given to you by your health care provider. Make  sure you discuss any questions you have with your health care provider. Document Revised: 03/25/2017 Document Reviewed: 04/02/2016 Elsevier Patient Education  2020 [place].   Acute Urinary Retention, Male  Acute urinary retention is a condition in which a person is unable to pass urine. This can last for a short time or for a long time. If left untreated, it can result in kidney damage or other serious complications. What are the causes? This condition may be caused by:  Obstruction or narrowing of the tube that drains the bladder (urethra). This may be caused by surgery or problems with nearby organs, such as the prostate gland, which can press or squeeze the urethra.  Problems with the nerves in the bladder. These can be caused by diseases, such as multiple sclerosis, or by spinal cord injuries.  Certain medicines.  Tumors in the area of the pelvis, bladder, or urethra.  Diabetes.  Degenerative cognitive conditions such as delirium or dementia.  Bladder or urinary tract infection.  Constipation.  Blood in the urine (hematuria).  Injury to the bladder or urethra.  Psychological (psychogenic) conditions. Someone may hold his urine due to trauma or because he does not want to use the bathroom. What increases the risk? This condition is more likely to develop in older men. As men age, their prostate may become larger and may start pressing or squeezing  on the bladder or the urethra. What are the signs or symptoms? Symptoms of this condition include:  Trouble urinating.  Pain in the lower abdomen. Symptoms usually come on slowly over a long period of time. How is this diagnosed? This condition is diagnosed based on a physical exam and a medical history. You may also have other tests, including:  An ultrasound of the bladder or kidneys or both.  Blood tests.  A urine analysis.  Additional tests may be needed such as an MRI, kidney, or bladder function tests. How  is this treated? Treatment for this condition may include:  Medicines.  Placing a thin, sterile tube (catheter) into the bladder to drain urine out of the body. This is called an indwelling urinary catheter. After being inserted, the catheter is held in place with a small balloon that is filled with sterile water. Urine drains from the catheter into a collection bag outside of the body.  Behavioral therapy.  Treatment for any underlying conditions.  If needed, you may be treated in the hospital for kidney function problems or to manage other complications. Follow these instructions at home:  Take over-the-counter and prescription medicines only as told by your health care provider. Avoid certain medicines, such as decongestants, antihistamines, and some prescription medicines. Do not take any medicine unless your health care provider has approved.  If you were given an indwelling urinary catheter, take care of it as told by your health care provider.  Drink enough fluid to keep your urine clear or pale yellow.  If you were prescribed an antibiotic, take it as told by your health care provider. Do not stop taking the antibiotic even if you start to feel better.  Do not use any products that contain nicotine or tobacco, such as cigarettes and e-cigarettes. If you need help quitting, ask your health care provider.  Monitor any changes in your symptoms. Tell your health care provider about any changes.  If instructed, monitor your blood pressure at home. Report changes as told by your health care provider.  Keep all follow-up visits as told by your health care provider. This is important. Contact a health care provider if:  You have uncomfortable bladder contractions that you cannot control (spasms) or you leak urine with the spasms. Get help right away if:  You have chills or fever.  You have blood in your urine.  You have a catheter and: ? Your catheter stops draining  urine. ? Your catheter falls out. Summary  Acute urinary retention is a condition in which a person is unable to pass urine. If left untreated, it can result in kidney damage or other serious complications.  The cause of this condition may include an enlarged prostate. As men age, their prostate gland may become larger and may start pressing or squeezing on the bladder or the urethra.  Treatment for this condition may include medicines and placement of an indwelling urinary catheter.  Monitor any changes in your symptoms. Tell your health care provider about any changes. This information is not intended to replace advice given to you by your health care provider. Make sure you discuss any questions you have with your health care provider. Document Revised: 03/25/2017 Document Reviewed: 05/14/2016 Elsevier Patient Education  [place].   Chronic Kidney Disease, Adult Chronic kidney disease (CKD) occurs when the kidneys become damaged slowly over a long period of time. The kidneys are a pair of organs that do many important jobs in the body, including:  Removing waste and extra fluid from the blood to make urine.  Making hormones that maintain the amount of fluid in tissues and blood vessels.  Maintaining the right amount of fluids and chemicals in the body. A small amount of kidney damage may not cause problems, but a large amount of damage may make it hard or impossible for the kidneys to work the way they should. If steps are not taken to slow down kidney damage or to stop it from getting worse, the kidneys may stop working permanently (end-stage renal disease or ESRD). Most of the time, CKD does not go away, but it can often be controlled. People who have CKD are usually able to live normal lives. What are the causes? The most common causes of this condition are diabetes and high blood pressure (hypertension). Other causes include:  Heart and blood vessel (cardiovascular)  disease.  Kidney diseases, such as: ? Glomerulonephritis. ? Interstitial nephritis. ? Polycystic kidney disease. ? Renal vascular disease.  Diseases that affect the immune system.  Genetic diseases.  Medicines that damage the kidneys, such as anti-inflammatory medicines.  Being around or being in contact with poisonous (toxic) substances.  A kidney or urinary infection that occurs again and again (recurs).  Vasculitis. This is swelling or inflammation of the blood vessels.  A problem with urine flow that may be caused by: ? Cancer. ? Having kidney stones more than one time. ? An enlarged prostate, in males. What increases the risk? You are more likely to develop this condition if you:  Are older than age 9.  Are male.  Are African-American, Hispanic, Asian, [place], or American [place].  Are a current or former smoker.  Are obese.  Have a family history of kidney disease or failure.  Often take medicines that are damaging to the kidneys. What are the signs or symptoms? Symptoms of this condition include:  Swelling (edema) of the face, legs, ankles, or feet.  Tiredness (lethargy) and having less energy.  Nausea or vomiting.  Confusion or trouble concentrating.  Problems with urination, such as: ? Painful or burning feeling during urination. ? Decreased urine production. ? Frequent urination, especially at night. ? Bloody urine.  Muscle twitches and cramps, especially in the legs.  Shortness of breath.  Weakness.  Loss of appetite.  Metallic taste in the mouth.  Trouble sleeping.  Dry, itchy skin.  A low blood count (anemia).  Pale lining of the eyelids and surface of the eye (conjunctiva). Symptoms develop slowly and may not be obvious until the kidney damage becomes severe. It is possible to have kidney disease for years without having any symptoms. How is this diagnosed? This condition may be diagnosed based on:  Blood  tests.  Urine tests.  Imaging tests, such as an ultrasound or CT scan.  A test in which a sample of tissue is removed from the kidneys to be examined under a microscope (kidney biopsy). These test results will help your health care provider determine how serious the CKD is. How is this treated? There is no cure for most cases of this condition, but treatment usually relieves symptoms and prevents or slows the progression of the disease. Treatment may include:  Making diet changes, which may require you to avoid alcohol, salty foods (sodium), and foods that are high in potassium, calcium, and protein.  Medicines: ? To lower blood pressure. ? To control blood glucose. ? To relieve anemia. ? To relieve swelling. ? To protect your bones. ? To  improve the balance of electrolytes in your blood.  Removing toxic waste from the body through types of dialysis, if the kidneys can no longer do their job (kidney failure).  Managing any other conditions that are causing your CKD or making it worse. Follow these instructions at home: Medicines  Take over-the-counter and prescription medicines only as told by your health care provider. The dose of some medicines that you take may need to be adjusted.  Do not take any new medicines unless approved by your health care provider. Many medicines can worsen your kidney damage.  Do not take any vitamin and mineral supplements unless approved by your health care provider. Many nutritional supplements can worsen your kidney damage. General instructions  Follow your prescribed diet as told by your health care provider.  Do not use any products that contain nicotine or tobacco, such as cigarettes and e-cigarettes. If you need help quitting, ask your health care provider.  Monitor and track your blood pressure at home. Report changes in your blood pressure as told by your health care provider.  If you are being treated for diabetes, monitor and track  your blood sugar (blood glucose) levels as told by your health care provider.  Maintain a healthy weight. If you need help with this, ask your health care provider.  Start or continue an exercise plan. Exercise at least 30 minutes a day, 5 days a week.  Keep your immunizations up to date as told by your health care provider.  Keep all follow-up visits as told by your health care provider. This is important. Where to find more information  American Association of Kidney Patients: BombTimer.gl  National Kidney Foundation: www.kidney.[place]: https://mathis.com/  Life Options Rehabilitation Program: www.lifeoptions.org and www.kidneyschool.org Contact a health care provider if:  Your symptoms get worse.  You develop new symptoms. Get help right away if:  You develop symptoms of ESRD, which include: ? Headaches. ? Numbness in the hands or feet. ? Easy bruising. ? Frequent hiccups. ? Chest pain. ? Shortness of breath. ? Lack of menstruation, in women.  You have a fever.  You have decreased urine production.  You have pain or bleeding when you urinate. Summary  Chronic kidney disease (CKD) occurs when the kidneys become damaged slowly over a long period of time.  The most common causes of this condition are diabetes and high blood pressure (hypertension).  There is no cure for most cases of this condition, but treatment usually relieves symptoms and prevents or slows the progression of the disease. Treatment may include a combination of medicines and lifestyle changes. This information is not intended to replace advice given to you by your health care provider. Make sure you discuss any questions you have with your health care provider. Document Revised: 03/25/2017 Document Reviewed: 05/20/2016 Elsevier Patient Education  2020 [place].   Acute Urinary Retention, Male  Acute urinary retention means that you cannot pee (urinate) at all, or that you pee  too little and your bladder is not emptied completely. If it is not treated, it can lead to kidney damage or other serious problems. Follow these instructions at home:  Take over-the-counter and prescription medicines only as told by your doctor. Ask your doctor what medicines you should stay away from. Do not take any medicine unless your doctor says it is okay to do so.  If you were sent home with a tube that drains the bladder (catheter), take care of it as told by  your doctor.  Drink enough fluid to keep your pee clear or pale yellow.  If you were given an antibiotic, take it as told by your doctor. Do not stop taking the antibiotic even if you start to feel better.  Do not use any products that contain nicotine or tobacco, such as cigarettes and e-cigarettes. If you need help quitting, ask your doctor.  Watch for changes in your symptoms. Tell your doctor about them.  If told, track changes in your blood pressure at home. Tell your doctor about them.  Keep all follow-up visits as told by your doctor. This is important. Contact a doctor if:  You have spasms or you leak pee when you have spasms. Get help right away if:  You have chills or a fever.  You have a tube that drains the bladder and: ? The tube stops draining pee. ? The tube falls out.  You have blood in your pee. Summary  Acute urinary retention means that you have problems peeing. It may mean that you cannot pee at all, or that you pee too little.  If this condition is not treated, it can lead to kidney damage or other serious problems.  If you were sent home with a tube that drains the bladder, take care of it as told by your doctor.  Monitor any changes in your symptoms. Tell your doctor about any changes. This information is not intended to replace advice given to you by your health care provider. Make sure you discuss any questions you have with your health care provider. Document Revised: 06/29/2018 Document  Reviewed: 05/14/2016 Elsevier Patient Education  [place].   Chronic Kidney Disease, Adult Chronic kidney disease (CKD) happens when the kidneys are damaged over a long period of time. The kidneys are two organs that help with:  Getting rid of waste and extra fluid from the blood.  Making hormones that maintain the amount of fluid in your tissues and blood vessels.  Making sure that the body has the right amount of fluids and chemicals. Most of the time, CKD does not go away, but it can usually be controlled. Steps must be taken to slow down the kidney damage or to stop it from getting worse. If this is not done, the kidneys may stop working. Follow these instructions at home: Medicines  Take over-the-counter and prescription medicines only as told by your doctor. You may need to change the amount of medicines you take.  Do not take any new medicines unless your doctor says it is okay. Many medicines can make your kidney damage worse.  Do not take any vitamin and supplements unless your doctor says it is okay. Many vitamins and supplements can make your kidney damage worse. General instructions  Follow a diet as told by your doctor. You may need to stay away from: ? Alcohol. ? Salty foods. ? Foods that are high in:  Potassium.  Calcium.  Protein.  Do not use any products that contain nicotine or tobacco, such as cigarettes and e-cigarettes. If you need help quitting, ask your doctor.  Keep track of your blood pressure at home. Tell your doctor about any changes.  If you have diabetes, keep track of your blood sugar as told by your doctor.  Try to stay at a healthy weight. If you need help, ask your doctor.  Exercise at least 30 minutes a day, 5 days a week.  Stay up-to-date with your shots (immunizations) as told by your doctor.  Keep all follow-up visits as told by your doctor. This is important. Contact a doctor if:  Your symptoms get worse.  You have  new symptoms. Get help right away if:  You have symptoms of end-stage kidney disease. These may include: ? Headaches. ? Numbness in your hands or feet. ? Easy bruising. ? Having hiccups often. ? Chest pain. ? Shortness of breath. ? Stopping of menstrual periods in women.  You have a fever.  You have very little pee (urine).  You have pain or bleeding when you pee. Summary  Chronic kidney disease (CKD) happens when the kidneys are damaged over a long period of time.  Most of the time, this condition does not go away, but it can usually be controlled. Steps must be taken to slow down the kidney damage or to stop it from getting worse.  Treatment may include a combination of medicines and lifestyle changes. This information is not intended to replace advice given to you by your health care provider. Make sure you discuss any questions you have with your health care provider. Document Revised: 03/25/2017 Document Reviewed: 05/17/2016 Elsevier Patient Education  [place] Pneumonia, Adult Pneumonia is an infection of the lungs. It causes swelling in the airways of the lungs. Mucus and fluid may also build up inside the airways. One type of pneumonia can happen while a person is in a hospital. A different type can happen when a person is not in a hospital (community-acquired pneumonia).  What are the causes?  This condition is caused by germs (viruses, bacteria, or fungi). Some types of germs can be passed from one person to another. This can happen when you breathe in droplets from the cough or sneeze of an infected person. What increases the risk? You are more likely to develop this condition if you:  Have a long-term (chronic) disease, such as: ? Chronic obstructive pulmonary disease (COPD). ? Asthma. ? Cystic fibrosis. ? Congestive heart failure. ? Diabetes. ? Kidney disease.  Have HIV.  Have sickle cell disease.  Have had your spleen  removed.  Do not take good care of your teeth and mouth (poor dental hygiene).  Have a medical condition that increases the risk of breathing in droplets from your own mouth and nose.  Have a weakened body defense system (immune system).  Are a smoker.  Travel to areas where the germs that cause this illness are common.  Are around certain animals or the places they live. What are the signs or symptoms?  A dry cough.  A wet (productive) cough.  Fever.  Sweating.  Chest pain. This often happens when breathing deeply or coughing.  Fast breathing or trouble breathing.  Shortness of breath.  Shaking chills.  Feeling tired (fatigue).  Muscle aches. How is this treated? Treatment for this condition depends on many things. Most adults can be treated at home. In some cases, treatment must happen in a hospital. Treatment may include:  Medicines given by mouth or through an IV tube.  Being given extra oxygen.  Respiratory therapy. In rare cases, treatment for very bad pneumonia may include:  Using a machine to help you breathe.  Having a procedure to remove fluid from around your lungs. Follow these instructions at home: Medicines  Take over-the-counter and prescription medicines only as told by your doctor. ? Only take cough medicine if you are losing sleep.  If you were prescribed an antibiotic medicine, take it as told by your doctor.  Do not stop taking the antibiotic even if you start to feel better. General instructions   Sleep with your head and neck raised (elevated). You can do this by sleeping in a recliner or by putting a few pillows under your head.  Rest as needed. Get at least 8 hours of sleep each night.  Drink enough water to keep your pee (urine) pale yellow.  Eat a healthy diet that includes plenty of vegetables, fruits, whole grains, low-fat dairy products, and lean protein.  Do not use any products that contain nicotine or tobacco. These  include cigarettes, e-cigarettes, and chewing tobacco. If you need help quitting, ask your doctor.  Keep all follow-up visits as told by your doctor. This is important. How is this prevented? A shot (vaccine) can help prevent pneumonia. Shots are often suggested for:  People older than 84 years of age.  People older than 84 years of age who: ? Are having cancer treatment. ? Have long-term (chronic) lung disease. ? Have problems with their body's defense system. You may also prevent pneumonia if you take these actions:  Get the flu (influenza) shot every year.  Go to the dentist as often as told.  Wash your hands often. If you cannot use soap and water, use hand sanitizer. Contact a doctor if:  You have a fever.  You lose sleep because your cough medicine does not help. Get help right away if:  You are short of breath and it gets worse.  You have more chest pain.  Your sickness gets worse. This is very serious if: ? You are an older adult. ? Your body's defense system is weak.  You cough up blood. Summary  Pneumonia is an infection of the lungs.  Most adults can be treated at home. Some will need treatment in a hospital.  Drink enough water to keep your pee pale yellow.  Get at least 8 hours of sleep each night. This information is not intended to replace advice given to you by your health care provider. Make sure you discuss any questions you have with your health care provider. Document Revised: 08/02/2018 Document Reviewed: 12/08/2017 Elsevier Patient Education  [place].

## 2019-10-09 NOTE — Progress Notes (Signed)
Patient and wife were  given written and verbal instrustions regarding discharge and catheter care.   IV's were removed and personal belongings with patient.   He is discharged with 62 french urinary catheter.

## 2019-10-09 NOTE — Discharge Summary (Signed)
Physician Discharge Summary  Verland Sprinkle GEX:[id number] DOB: 11-06-1932 DOA: 10/05/2019  PCP: Kristen Loader, FNP  Admit date: 10/05/2019 Discharge date: 10/09/2019  Admitted From: Home Disposition: Home   Recommendations for Outpatient Follow-up:  1. Follow up with PCP in the next week with repeat BMP. Note medication changes as below.  2. Has lab follow up and appointment with nephrology per nephrology as below.  3. Needs to follow up with urology. Started flomax, and discharged with foley catheter (inserted 6/15) due to recurrent urinary retention.  4. Repeat CXR in 4-6 weeks to follow to resolution RLL opacity after antibiotic trial.  Home Health: None recommended Equipment/Devices: None recommended Discharge Condition: Stable CODE STATUS: Full Diet recommendation: Renal, heart healthy  Brief/Interim Summary: Yedidya Duddy is an 86 y.o. male with a history of MGUS, stage IV CKD, HTN, hypertriglyceridemia who presented to the ED 6/11 with fatigue worsening to severe over several weeks associated with generalized weakness. Evaluation in the ED revealed evidence of renal impairment, SCr 3.56, BUN 76, elevated BNP to 610 and normal troponin x3. Hgb noted to be 12, normocytic, in the absence of bleeding by history or exam. Dipstick urinalysis was unremarkable. Diuretic was held with improvement in creatinine and subsequent improvement in fatigue. PT/OT worked with the patient several times and noted no significant weakness, recommended no specific follow up. Nephrology was consulted and made recommendations as below prior to discharge.   Discharge Diagnoses:  Principal Problem:   Acute kidney injury superimposed on CKD [facility]) Active Problems:   Hypertension   Heart murmur   CKD (chronic kidney disease), stage IV (HCC)   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HCC)   Bradycardia   Hypotension   Fatigue  Fatigue: No actual focal weakness on exam, and other than tiring quickly, he remains  functionally independent. PT/OT have signed off.  - Treat conditions below, improved.  AKI on stage IV CKD: Biopsy-proven hypertensive nephrosclerosis. UA was unremarkable. Renal U/S consistent with medical renal disease without hydronephrosis. FENa 1.27% suggestive of intrarenal etiology due to ARB, hemodynamics (hypotension due to dehydration), though may have had some acute obstructive uropathy improved with foley and an element of prerenal azotemia w/improvement from IVF - Nephrology consulted: SCr improved 6/14 with IV fluids. Hold diuretic for a few days, hole ARB until follow up per nephrology recommendations.  Acute urinary retention: Recurrent problem per pt and wife, has seen Dr. Raynelle Bring for prostate enlargement in the past. - Foley replaced 6/15 for recurrent retention, will need f[facility].  - Started flomax (will replace hytrin)  History of grade 2 diastolic dysfunction, cardiomegaly: ECG with abnormal axis, mild 1st degree AVB, no ST deviations. Troponin negative x3   HTN:  - Medications changed per nephrology as below.  RLL opacity: Noted on CXR and characteristic of infectious etiology on CT chest.  - Will empirically treat for pneumonia with cefdinir and azithromycin. Recommend repeat CXR in 4-6 weeks.   IgM MGUS Dx 2013: Was under surveillance with Dr. Alen Blew, though last visit was April 2019. - continue oncology follow up.  Anemia of chronic kidney disease: Stable on recheck.    Discharge Instructions Discharge Instructions    Discharge instructions   Complete by: As directed    Hold olmesartan until you follow up with labs with your kidney doctor.  Take lasix 60mg  once daily starting 6/17.  Take flomax once daily and continue the foley urinary catheter until you see urology in the next 1-2 weeks. The flomax will replace hytrin which  you had been taking.  Complete a course of antibiotics for a possible pneumonia by taking cefdinir and  azithromycin once daily for 5 and 3 more days, respectively. You will need to follow up with your primary care doctor and have a repeat chest xray in 4-6 weeks.  If your symptoms worsen, seek medical attention.     Allergies as of 10/09/2019   No Known Allergies     Medication List    STOP taking these medications   MUSCLE RUB EX   ranitidine 150 MG tablet Commonly known as: ZANTAC   terazosin 10 MG capsule Commonly known as: HYTRIN     TAKE these medications   acetaminophen 650 MG CR tablet Commonly known as: TYLENOL Take 650 mg by mouth every 8 (eight) hours as needed for pain.   amLODipine 10 MG tablet Commonly known as: NORVASC Take 10 mg by mouth daily.   aspirin 81 MG tablet Take 81 mg by mouth every evening.   azithromycin 250 MG tablet Commonly known as: ZITHROMAX Take 1 tablet (250 mg total) by mouth daily.   calcitRIOL 0.25 MCG capsule Commonly known as: ROCALTROL Take 0.25 mcg by mouth daily.   cefdinir 300 MG capsule Commonly known as: OMNICEF Take 1 capsule (300 mg total) by mouth daily.   cetirizine 10 MG tablet Commonly known as: ZYRTEC Take 10 mg by mouth daily.   fish oil-omega-3 fatty acids 1000 MG capsule Take 1,000 mg by mouth daily.   fluticasone 50 MCG/ACT nasal spray Commonly known as: FLONASE Place 1 spray into the nose daily as needed for allergies.   furosemide 20 MG tablet Commonly known as: LASIX Take 3 tablets (60 mg total) by mouth daily. What changed:   medication strength  how much to take   GLUCOSAMINE CHONDR COMPLEX PO Take 750 mg by mouth 2 (two) times daily.   hydrALAZINE 25 MG tablet Commonly known as: APRESOLINE Take 25 mg by mouth in the morning and at bedtime.   metoprolol succinate 50 MG 24 hr tablet Commonly known as: TOPROL-XL Take 50 mg by mouth every evening.   olmesartan 40 MG tablet Commonly known as: BENICAR Take 40 mg by mouth every morning.   tamsulosin 0.4 MG Caps capsule Commonly known  as: FLOMAX Take 1 capsule (0.4 mg total) by mouth daily after supper.   Voltaren 1 % Gel Generic drug: diclofenac Sodium Apply 2 g topically 4 (four) times daily as needed (pain).       Follow-up Information    D'Ercole, Roseanne Reno, MD Follow up.   Specialty: Anesthesiology Contact information: [address] [place] [address] [phone]        Donato Heinz, MD. Go on 10/24/2019.   Specialty: Nephrology Why: Go to [employer] [facility] [employer] anytime the week of June 28th- July 2nd Contact information: [place] [address] [phone]              No Known Allergies  Consultations:  Nephrology  Procedures/Studies: DG Chest 2 View  Result Date: 10/05/2019 CLINICAL DATA:  Fatigue and shortness of breath EXAM: CHEST - 2 VIEW COMPARISON:  06/16/2013 FINDINGS: Cardiac shadow is enlarged. Aortic calcifications are again seen. The lungs are well aerated bilaterally. Calcified granuloma is again seen in the left upper lobe. Mild opacity is noted in the right lung base projecting in the lower lobe consistent with early infiltrate. No bony abnormality is noted. IMPRESSION: Early right basilar infiltrate.  Electronically Signed   By: Inez Catalina M.D.   On: 10/05/2019 15:31   CT CHEST WO CONTRAST  Result Date: 10/07/2019 CLINICAL DATA:  86 year old male with abnormal chest x-rays yesterday. Fatigue and shortness of breath. EXAM: CT CHEST WITHOUT CONTRAST TECHNIQUE: Multidetector CT imaging of the chest was performed following the standard protocol without IV contrast. COMPARISON:  Chest radiographs yesterday and earlier. FINDINGS: Cardiovascular: Calcified aortic atherosclerosis. Calcified coronary artery atherosclerosis. Vascular patency is not evaluated in the absence of IV contrast. Cardiac size at the upper limits of normal. Small volume of pericardial fluid could be physiologic (series 2, image 114).  Mediastinum/Nodes: Negative.  No lymphadenopathy. Lungs/Pleura: Trace bilateral dependent pleural fluid. There is a small focus of posterior right pleural calcification in the lower lobe. Major airways are patent. Coarsely calcified left upper lobe granuloma with minor regional scarring. Much smaller calcified right upper lobe granuloma on series 5, image 27. Asymmetric right middle and lower lobe subpleural reticular opacity. Several superimposed small calcified right lower lobe granulomas. Negative left lung base. Upper Abdomen: Negative visible noncontrast liver, gallbladder, spleen, pancreas, adrenal glands and kidneys. Moderate distension of the stomach with gas and food debris. Other visible bowel loops appear negative. Musculoskeletal: Generally mild for age degenerative changes. There is a conspicuous thoracic partially calcified disc herniation at T8-T9. No acute osseous abnormality identified. IMPRESSION: 1. Right middle and lower lobe subpleural reticular opacity is nonspecific but suspicious for acute distal airway infection. Asymmetric interstitial lung disease is also possible. 2. Trace bilateral pleural fluid. Trace right pleural calcification. 3. Small pericardial effusion, could be physiologic. 4. Multiple pulmonary calcified granulomas, the largest in the left upper lobe. 5. Calcified coronary artery and Aortic atherosclerosis (ICD10-I70.0). Electronically Signed   By: Genevie Ann M.D.   On: 10/07/2019 13:35   US RENAL  Result Date: 10/06/2019 CLINICAL DATA:  Acute renal injury EXAM: RENAL / URINARY TRACT ULTRASOUND COMPLETE COMPARISON:  None. FINDINGS: Right Kidney: Renal measurements: 10.8 x 4.7 x 5.2 cm. = volume: 141 mL. Cortical thinning is noted with increased echogenicity. No hydronephrosis is seen. Left Kidney: Renal measurements: 11.0 x 6.3 x 5.6 cm. = volume: 205 mL. 12 cm cyst is noted in the left kidney, simple in nature. Increased echogenicity and cortical thinning is seen. No  obstructive changes are noted. Bladder: Appears normal for degree of bladder distention. Other: None. IMPRESSION: Large left renal cyst. Increased echogenicity and cortical thinning consistent with medical renal disease. Electronically Signed   By: Inez Catalina M.D.   On: 10/06/2019 15:47   ECHOCARDIOGRAM COMPLETE  Result Date: 10/06/2019    ECHOCARDIOGRAM REPORT   Patient Name:   BOBY EYER Date of Exam: 10/06/2019 Medical Rec #:  [id number]      Height:       67.0 in Accession #:    [id number]     Weight:       133.8 lb Date of Birth:  [birth year]      BSA:          1.705 m Patient Age:    73 years       BP:           133/54 mmHg Patient Gender: M              HR:           62 bpm. Exam Location:  Inpatient Procedure: 2D Echo, Color Doppler and Cardiac Doppler Indications:    Fatigue  History:  Patient has prior history of Echocardiogram examinations, most                 recent 03/11/2016. Risk Factors:Hypertension.  Sonographer:    Raquel Sarna Senior RDCS Referring Phys: [id number] [place]  1. Abnormal septal motion . Left ventricular ejection fraction, by estimation, is 50 to 55%. The left ventricle has low normal function. The left ventricle has no regional wall motion abnormalities. The left ventricular internal cavity size was mildly dilated. There is mild left ventricular hypertrophy. Left ventricular diastolic parameters are consistent with age-related delayed relaxation (normal). Elevated left ventricular end-diastolic pressure.  2. Right ventricular systolic function is normal. The right ventricular size is normal.  3. The mitral valve is degenerative. No evidence of mitral valve regurgitation. No evidence of mitral stenosis.  4. The aortic valve is normal in structure. Aortic valve regurgitation is moderate. Mild aortic valve sclerosis is present, with no evidence of aortic valve stenosis.  5. Aortic dilatation noted. There is mild dilatation of the aortic root measuring 38 mm.  6.  The inferior vena cava is normal in size with greater than 50% respiratory variability, suggesting right atrial pressure of 3 mmHg. FINDINGS  Left Ventricle: Abnormal septal motion. Left ventricular ejection fraction, by estimation, is 50 to 55%. The left ventricle has low normal function. The left ventricle has no regional wall motion abnormalities. The left ventricular internal cavity size was mildly dilated. There is mild left ventricular hypertrophy. Left ventricular diastolic parameters are consistent with age-related delayed relaxation (normal). Elevated left ventricular end-diastolic pressure. Right Ventricle: The right ventricular size is normal. No increase in right ventricular wall thickness. Right ventricular systolic function is normal. Left Atrium: Left atrial size was normal in size. Right Atrium: Right atrial size was normal in size. Pericardium: There is no evidence of pericardial effusion. Mitral Valve: The mitral valve is degenerative in appearance. There is moderate thickening of the mitral valve leaflet(s). There is moderate calcification of the mitral valve leaflet(s). Normal mobility of the mitral valve leaflets. Mild mitral annular calcification. No evidence of mitral valve regurgitation. No evidence of mitral valve stenosis. Tricuspid Valve: The tricuspid valve is normal in structure. Tricuspid valve regurgitation is not demonstrated. No evidence of tricuspid stenosis. Aortic Valve: The aortic valve is normal in structure. Aortic valve regurgitation is moderate. Aortic regurgitation PHT measures 346 msec. Mild aortic valve sclerosis is present, with no evidence of aortic valve stenosis. Aortic valve mean gradient measures 5.0 mmHg. Aortic valve peak gradient measures 9.7 mmHg. Aortic valve area, by VTI measures 2.56 cm. Pulmonic Valve: The pulmonic valve was normal in structure. Pulmonic valve regurgitation is not visualized. No evidence of pulmonic stenosis. Aorta: The aortic root is normal  in size and structure and aortic dilatation noted. There is mild dilatation of the aortic root measuring 38 mm. Venous: The inferior vena cava is normal in size with greater than 50% respiratory variability, suggesting right atrial pressure of 3 mmHg. IAS/Shunts: No atrial level shunt detected by color flow Doppler.  LEFT VENTRICLE PLAX 2D LVIDd:         5.10 cm  Diastology LVIDs:         3.10 cm  LV e' lateral:   3.26 cm/s LV PW:         1.10 cm  LV E/e' lateral: 20.1 LV IVS:        1.20 cm  LV e' medial:    3.92 cm/s LVOT diam:     1.90  cm  LV E/e' medial:  16.7 LV SV:         88 LV SV Index:   52 LVOT Area:     2.84 cm  RIGHT VENTRICLE RV S prime:     17.30 cm/s TAPSE (M-mode): 1.5 cm LEFT ATRIUM             Index       RIGHT ATRIUM           Index LA diam:        3.70 cm 2.17 cm/m  RA Area:     12.90 cm LA Vol (A2C):   65.4 ml 38.37 ml/m RA Volume:   25.30 ml  14.84 ml/m LA Vol (A4C):   49.2 ml 28.86 ml/m LA Biplane Vol: 61.9 ml 36.31 ml/m  AORTIC VALVE AV Area (Vmax):    2.16 cm AV Area (Vmean):   2.11 cm AV Area (VTI):     2.56 cm AV Vmax:           156.00 cm/s AV Vmean:          103.000 cm/s AV VTI:            0.343 m AV Peak Grad:      9.7 mmHg AV Mean Grad:      5.0 mmHg LVOT Vmax:         119.00 cm/s LVOT Vmean:        76.700 cm/s LVOT VTI:          0.310 m LVOT/AV VTI ratio: 0.90 AI PHT:            346 msec  AORTA Ao Root diam: 3.30 cm MITRAL VALVE MV Area (PHT): 1.56 cm     SHUNTS MV Decel Time: 486 msec     Systemic VTI:  0.31 m MV E velocity: 65.50 cm/s   Systemic Diam: 1.90 cm MV A velocity: 111.00 cm/s MV E/A ratio:  0.59 Jenkins Rouge MD Electronically signed by Jenkins Rouge MD Signature Date/Time: 10/06/2019/11:55:04 AM    Final        Subjective: Feels well, at baseline, wants to go home.   Discharge Exam: Vitals:   10/08/19 1156 10/09/19 0716  BP: (!) 166/42 (!) 176/53  Pulse: (!) 59 61  Resp: 18 18  Temp: 98.4 F (36.9 C) 99 F (37.2 C)  SpO2: 94% 96%   General: Pt  is alert, awake, not in acute distress Cardiovascular: RRR, S1/S2 +, no rubs, no gallops Respiratory: CTA bilaterally, no wheezing, no rhonchi Abdominal: Soft, NT, ND, bowel sounds + Extremities: No edema, no cyanosis  Labs: BNP (last 3 results) Recent Labs    10/05/19 1349  BNP 681.1*   Basic Metabolic Panel: Recent Labs  Lab 10/05/19 1349 10/05/19 1349 10/05/19 1856 10/06/19 0310 10/07/19 0334 10/08/19 0414 10/09/19 0430  NA 134*  --   --  136 138 141 141  K 4.9  --   --  4.6 4.5 4.5 4.4  CL 100  --   --  102 108 113* 113*  CO2 23  --   --  22 23 20* 21*  GLUCOSE 165*  --   --  140* 94 92 104*  BUN 76*  --   --  69* 59* 41* 33*  CREATININE 3.56*   < > 3.42* 3.37* 3.25* 2.44* 2.33*  CALCIUM 9.5  --   --  9.7 9.0 8.9 9.3  MG  --   --   --  2.5* 2.4  --   --   PHOS  --   --   --  3.7 3.6  --   --    < > = values in this interval not displayed.   Liver Function Tests: Recent Labs  Lab 10/05/19 1349 10/06/19 0310 10/07/19 0334 10/08/19 0414 10/09/19 0430  AST 13* 12* 12* 13* 17  ALT 14 13 11 12 15   ALKPHOS 78 77 64 60 64  BILITOT 0.5 0.9 0.7 0.7 0.5  PROT 6.4* 6.5 5.6* 5.4* 5.5*  ALBUMIN 3.3* 3.4* 2.9* 2.8* 2.8*   No results for input(s): LIPASE, AMYLASE in the last 168 hours. No results for input(s): AMMONIA in the last 168 hours. CBC: Recent Labs  Lab 10/05/19 1349 10/05/19 1349 10/05/19 1856 10/06/19 0310 10/07/19 0334 10/08/19 0414 10/09/19 0430  WBC 9.9   < > 9.9 12.6* 9.3 8.8 9.9  NEUTROABS 5.1  --   --  9.0* 4.9 4.2 5.3  HGB 12.0*   < > 12.0* 11.3* 9.8* 10.1* 9.7*  HCT 37.2*   < > 37.7* 35.7* 31.6* 32.4* 30.9*  MCV 95.1   < > 96.7 95.2 98.8 98.8 96.6  PLT 280   < > 295 301 272 245 254   < > = values in this interval not displayed.   Cardiac Enzymes: No results for input(s): CKTOTAL, CKMB, CKMBINDEX, TROPONINI in the last 168 hours. BNP: Invalid input(s): POCBNP CBG: No results for input(s): GLUCAP in the last 168 hours. D-Dimer No  results for input(s): DDIMER in the last 72 hours. Hgb A1c No results for input(s): HGBA1C in the last 72 hours. Lipid Profile No results for input(s): CHOL, HDL, LDLCALC, TRIG, CHOLHDL, LDLDIRECT in the last 72 hours. Thyroid function studies No results for input(s): TSH, T4TOTAL, T3FREE, THYROIDAB in the last 72 hours.  Invalid input(s): FREET3 Anemia work up No results for input(s): VITAMINB12, FOLATE, FERRITIN, TIBC, IRON, RETICCTPCT in the last 72 hours. Urinalysis    Component Value Date/Time   COLORURINE STRAW (A) 10/05/2019 1338   APPEARANCEUR CLEAR 10/05/2019 1338   LABSPEC 1.005 10/05/2019 1338   PHURINE 7.0 10/05/2019 [place] 10/05/2019 1338   HGBUR NEGATIVE 10/05/2019 1338   BILIRUBINUR NEGATIVE 10/05/2019 1338   [place] 10/05/2019 1338   PROTEINUR NEGATIVE 10/05/2019 1338   NITRITE NEGATIVE 10/05/2019 1338   LEUKOCYTESUR NEGATIVE 10/05/2019 1338    Microbiology Recent Results (from the past 240 hour(s))  SARS Coronavirus 2 by RT PCR (hospital order, performed in [facility] hospital lab) Nasopharyngeal Nasopharyngeal Swab     Status: None   Collection Time: 10/05/19  3:12 PM   Specimen: Nasopharyngeal Swab  Result Value Ref Range Status   SARS Coronavirus 2 NEGATIVE NEGATIVE Final    Comment: (NOTE) SARS-CoV-2 target nucleic acids are NOT DETECTED.  The SARS-CoV-2 RNA is generally detectable in upper and lower respiratory specimens during the acute phase of infection. The lowest concentration of SARS-CoV-2 viral copies this assay can detect is 250 copies / mL. A negative result does not preclude SARS-CoV-2 infection and should not be used as the sole basis for treatment or other patient management decisions.  A negative result may occur with improper specimen collection / handling, submission of specimen other than nasopharyngeal swab, presence of viral mutation(s) within the areas targeted by this assay, and inadequate number of  viral copies (<250 copies / mL). A negative result must be combined with clinical observations, patient history, and epidemiological information.  Fact  Sheet for Patients:   StrictlyIdeas.no  Fact Sheet for Healthcare Providers: BankingDealers.co.za  This test is not yet approved or  cleared by the [place] FDA and has been authorized for detection and/or diagnosis of SARS-CoV-2 by FDA under an Emergency Use Authorization (EUA).  This EUA will remain in effect (meaning this test can be used) for the duration of the COVID-19 declaration under Section 564(b)(1) of the Act, 21 U.S.C. section 360bbb-3(b)(1), unless the authorization is terminated or revoked sooner.  Performed at [facility], [place] [address] [place], [place] [address]   Urine culture     Status: None   Collection Time: 10/05/19  6:56 PM   Specimen: Urine, Random  Result Value Ref Range Status   Specimen Description   Final    URINE, RANDOM Performed at [place] [address], [place], [place] [address]    Special Requests   Final    NONE Performed at [facility], [place] [address], [place], [place] [address]    Culture   Final    NO GROWTH Performed at [facility], [place] [address], [place], [place] [address]    Report Status 10/06/2019 FINAL  Final    Time coordinating discharge: Approximately 40 minutes  Patrecia Pour, MD  [facility] 10/09/2019, 5:50 PM

## 2019-12-19 ENCOUNTER — Other Ambulatory Visit (HOSPITAL_COMMUNITY): Payer: Self-pay | Admitting: *Deleted

## 2019-12-19 NOTE — Discharge Instructions (Signed)

## 2019-12-20 ENCOUNTER — Other Ambulatory Visit: Payer: Self-pay

## 2019-12-20 ENCOUNTER — Ambulatory Visit (HOSPITAL_COMMUNITY)
Admission: RE | Admit: 2019-12-20 | Discharge: 2019-12-20 | Disposition: A | Discharge status: Home / Self Care | Payer: Medicare Other | Source: Ambulatory Visit | Attending: Nephrology | Admitting: Nephrology

## 2019-12-20 DIAGNOSIS — N184 Chronic kidney disease, stage 4 (severe): Secondary | ICD-10-CM | POA: Diagnosis not present

## 2019-12-20 DIAGNOSIS — D631 Anemia in chronic kidney disease: Secondary | ICD-10-CM | POA: Diagnosis not present

## 2019-12-20 MED ORDER — SODIUM CHLORIDE 0.9 % IV SOLN
510.0000 mg | INTRAVENOUS | Status: DC
  Administered 2019-12-20: 510 mg via INTRAVENOUS
  Filled 2019-12-20: qty 17

## 2019-12-27 ENCOUNTER — Other Ambulatory Visit: Payer: Self-pay

## 2019-12-27 ENCOUNTER — Encounter (HOSPITAL_COMMUNITY)
Admission: RE | Admit: 2019-12-27 | Discharge: 2019-12-27 | Disposition: A | Discharge status: Home / Self Care | Payer: Medicare Other | Source: Ambulatory Visit | Attending: Nephrology | Admitting: Nephrology

## 2019-12-27 DIAGNOSIS — D631 Anemia in chronic kidney disease: Secondary | ICD-10-CM | POA: Insufficient documentation

## 2019-12-27 DIAGNOSIS — N184 Chronic kidney disease, stage 4 (severe): Secondary | ICD-10-CM | POA: Diagnosis not present

## 2019-12-27 MED ORDER — SODIUM CHLORIDE 0.9 % IV SOLN
510.0000 mg | INTRAVENOUS | Status: DC
  Administered 2019-12-27: 510 mg via INTRAVENOUS
  Filled 2019-12-27: qty 17

## 2020-01-25 ENCOUNTER — Other Ambulatory Visit: Payer: Self-pay

## 2020-01-25 DIAGNOSIS — N184 Chronic kidney disease, stage 4 (severe): Secondary | ICD-10-CM

## 2020-02-05 ENCOUNTER — Ambulatory Visit: Payer: Medicare Other | Attending: Internal Medicine

## 2020-02-05 ENCOUNTER — Other Ambulatory Visit (HOSPITAL_BASED_OUTPATIENT_CLINIC_OR_DEPARTMENT_OTHER): Payer: Self-pay | Admitting: Internal Medicine

## 2020-02-05 DIAGNOSIS — Z23 Encounter for immunization: Secondary | ICD-10-CM

## 2020-02-05 NOTE — Progress Notes (Signed)
   Covid-19 Vaccination Clinic  Name:  Jeffrey Payne    MRN: [id number] DOB: 1932-10-15  02/05/2020  Jeffrey Payne was observed post Covid-19 immunization for 15 minutes without incident. He was provided with Vaccine Information Sheet and instruction to access the V-Safe system. Vaccinated By: Kristeen Miss   Jeffrey Payne was instructed to call 911 with any severe reactions post vaccine: Marland Kitchen Difficulty breathing  . Swelling of face and throat  . A fast heartbeat  . A bad rash all over body  . Dizziness and weakness

## 2020-02-13 MED FILL — PFIZER-BIONTECH COVID-19 VA: 30 | 1 days supply | Qty: 0 | Fill #0

## 2020-02-14 ENCOUNTER — Other Ambulatory Visit: Payer: Self-pay | Admitting: *Deleted

## 2020-02-14 ENCOUNTER — Ambulatory Visit (HOSPITAL_COMMUNITY)
Admission: RE | Admit: 2020-02-14 | Discharge: 2020-02-14 | Disposition: A | Discharge status: Home / Self Care | Payer: Medicare Other | Source: Ambulatory Visit | Attending: Vascular Surgery | Admitting: Vascular Surgery

## 2020-02-14 ENCOUNTER — Encounter: Payer: Self-pay | Admitting: Vascular Surgery

## 2020-02-14 ENCOUNTER — Ambulatory Visit (INDEPENDENT_AMBULATORY_CARE_PROVIDER_SITE_OTHER): Payer: Medicare Other | Admitting: Vascular Surgery

## 2020-02-14 ENCOUNTER — Encounter: Payer: Self-pay | Admitting: *Deleted

## 2020-02-14 ENCOUNTER — Ambulatory Visit (INDEPENDENT_AMBULATORY_CARE_PROVIDER_SITE_OTHER)
Admission: RE | Admit: 2020-02-14 | Discharge: 2020-02-14 | Disposition: A | Discharge status: Home / Self Care | Payer: Medicare Other | Source: Ambulatory Visit | Attending: Vascular Surgery | Admitting: Vascular Surgery

## 2020-02-14 ENCOUNTER — Other Ambulatory Visit: Payer: Self-pay

## 2020-02-14 VITALS — BP 172/55 | HR 66 | Temp 97.6°F | Resp 20 | Ht 67.0 in | Wt 131.9 lb

## 2020-02-14 DIAGNOSIS — N184 Chronic kidney disease, stage 4 (severe): Secondary | ICD-10-CM

## 2020-02-14 NOTE — H&P (View-Only) (Signed)
Referring Physician: Dr. Arty Baumgartner  Patient name: Jeffrey Payne MRN: [id number] DOB: 05-22-1932 Sex: male  REASON FOR CONSULT: Hemodialysis access  HPI: Kali Ambler is a 86 y.o. male, currently CKD 4 not on hemodialysis.  He is referred for possible placement of the fistula but to wait on AV graft for now.  Patient is left-handed.  He has not had any prior access procedures.  He has not had any significant central vein manipulation.  He is hard of hearing.  His wife was present for the office visit today.   Past Medical History:  Diagnosis Date   CKD (chronic kidney disease) stage 3, GFR 30-59 ml/min (HCC)    ED (erectile dysfunction)    GERD (gastroesophageal reflux disease)    Heart murmur    Hypertension    Hypertriglyceridemia    Rosacea    History reviewed. No pertinent surgical history.  Family History  Problem Relation Age of Onset   Cancer Mother     SOCIAL HISTORY: Social History   Socioeconomic History   Marital status: Married    Spouse name: Not on file   Number of children: Not on file   Years of education: Not on file   Highest education level: Not on file  Occupational History   Not on file  Tobacco Use   Smoking status: Never Smoker   Smokeless tobacco: Never Used  Vaping Use   Vaping Use: Never used  Substance and Sexual Activity   Alcohol use: Yes    Comment: 2 cocktails per day   Drug use: No   Sexual activity: Not on file  Other Topics Concern   Not on file  Social History Narrative   Not on file   Social Determinants of Health   Financial Resource Strain:    Difficulty of Paying Living Expenses: Not on file  Food Insecurity:    Worried About [place] in the Last Year: Not on file   Ran Out of Food in the Last Year: Not on file  Transportation Needs:    Lack of Transportation (Medical): Not on file   Lack of Transportation (Non-Medical): Not on file  Physical Activity:    Days of Exercise  per Week: Not on file   Minutes of Exercise per Session: Not on file  Stress:    Feeling of Stress : Not on file  Social Connections:    Frequency of Communication with Friends and Family: Not on file   Frequency of Social Gatherings with Friends and Family: Not on file   Attends Religious Services: Not on file   Active Member of Clubs or Organizations: Not on file   Attends Archivist Meetings: Not on file   Marital Status: Not on file  Intimate Partner Violence:    Fear of Current or Ex-Partner: Not on file   Emotionally Abused: Not on file   Physically Abused: Not on file   Sexually Abused: Not on file    No Known Allergies  Current Outpatient Medications  Medication Sig Dispense Refill   acetaminophen (TYLENOL) 650 MG CR tablet Take 650 mg by mouth every 8 (eight) hours as needed for pain.      amLODipine (NORVASC) 10 MG tablet Take 10 mg by mouth daily.     aspirin 81 MG tablet Take 81 mg by mouth every evening.      azithromycin (ZITHROMAX) 250 MG tablet Take 1 tablet (250 mg total) by mouth daily. 3 each 0  calcitRIOL (ROCALTROL) 0.25 MCG capsule Take 0.25 mcg by mouth daily.      cefdinir (OMNICEF) 300 MG capsule Take 1 capsule (300 mg total) by mouth daily. 5 capsule 0   cetirizine (ZYRTEC) 10 MG tablet Take 10 mg by mouth daily.     diclofenac Sodium (VOLTAREN) 1 % GEL Apply 2 g topically 4 (four) times daily as needed (pain).     fish oil-omega-3 fatty acids 1000 MG capsule Take 1,000 mg by mouth daily.      fluticasone (FLONASE) 50 MCG/ACT nasal spray Place 1 spray into the nose daily as needed for allergies.      furosemide (LASIX) 20 MG tablet Take 3 tablets (60 mg total) by mouth daily. 90 tablet 0   Glucosamine-Chondroitin (GLUCOSAMINE CHONDR COMPLEX PO) Take 750 mg by mouth 2 (two) times daily.     hydrALAZINE (APRESOLINE) 25 MG tablet Take 25 mg by mouth in the morning and at bedtime.      metoprolol succinate (TOPROL-XL) 50 MG  24 hr tablet Take 50 mg by mouth every evening.   1   olmesartan (BENICAR) 40 MG tablet Take 40 mg by mouth every morning.     tamsulosin (FLOMAX) 0.4 MG CAPS capsule Take 1 capsule (0.4 mg total) by mouth daily after supper. 30 capsule 0   No current facility-administered medications for this visit.    ROS:   General:  No weight loss, Fever, chills  HEENT: No recent headaches, no nasal bleeding, no visual changes, no sore throat  Neurologic: No dizziness, blackouts, seizures. No recent symptoms of stroke or mini- stroke. No recent episodes of slurred speech, or temporary blindness.  Cardiac: No recent episodes of chest pain/pressure, no shortness of breath at rest.  No shortness of breath with exertion.  Denies history of atrial fibrillation or irregular heartbeat  Vascular: No history of rest pain in feet.  No history of claudication.  No history of non-healing ulcer, No history of DVT   Pulmonary: No home oxygen, no productive cough, no hemoptysis,  No asthma or wheezing  Musculoskeletal:  [ ]  Arthritis, [ ]  Low back pain,  [ ]  Joint pain  Hematologic:No history of hypercoagulable state.  No history of easy bleeding.  No history of anemia  Gastrointestinal: No hematochezia or melena,  No gastroesophageal reflux, no trouble swallowing  Urinary: [X]  chronic Kidney disease, [ ]  on HD - [ ]  MWF or [ ]  TTHS, [ ]  Burning with urination, [ ]  Frequent urination, [ ]  Difficulty urinating;   Skin: No rashes  Psychological: No history of anxiety,  No history of depression   Physical Examination  Vitals:   02/14/20 1350  BP: (!) 172/55  Pulse: 66  Resp: 20  Temp: 97.6 F (36.4 C)  SpO2: 94%  Weight: 131 lb 14.4 oz (59.8 kg)  Height: 5\' 7"  (1.702 m)    Body mass index is 20.66 kg/m.  General:  Alert and oriented, no acute distress HEENT: Normal Neck: No bruit or JVD Pulmonary: Clear to auscultation bilaterally Cardiac: Regular Rate and Rhythm without murmur Abdomen:  Soft, non-tender, non-distended, no mass, no scars Skin: No rash Extremity Pulses:  2+ radial, brachial, femoral, dorsalis pedis, posterior tibial pulses bilaterally Musculoskeletal: No deformity or edema  Neurologic: Upper and lower extremity motor 5/5 and symmetric  DATA:  Patient had an upper extremity arterial duplex exam today which I reviewed and interpreted.  This was normal.  He also had a vein mapping ultrasound today which showed  2-1/2 mm upper arm vein on the left side all of his veins were about 2-1/2 mm in diameter.  ASSESSMENT: Left brachiocephalic AV fistula scheduled for March 04, 2020.  Risk benefits possible complications and procedure details were discussed with patient and his wife today including not limited to bleeding infection ischemic steal.  I also discussed with him that with the vein diameter of 2-1/2 mm in size this may not grow large enough to mature.  I did discuss with him that I think it is worthwhile to try to get a fistula going in the upper arm and we have some time to try to get this to mature if it does not mature he would need an AV graft at some point in future.   PLAN: See above left brachiocephalic AV fistula March 04, 2020.   Ruta Hinds, MD [facility] Office: [phone]

## 2020-02-14 NOTE — Progress Notes (Signed)
Referring Physician: Dr. Arty Baumgartner  Patient name: Jeffrey Payne MRN: [id number] DOB: 09/23/1932 Sex: male  REASON FOR CONSULT: Hemodialysis access  HPI: Jeffrey Payne is a 86 y.o. male, currently CKD 4 not on hemodialysis.  He is referred for possible placement of the fistula but to wait on AV graft for now.  Patient is left-handed.  He has not had any prior access procedures.  He has not had any significant central vein manipulation.  He is hard of hearing.  His wife was present for the office visit today.   Past Medical History:  Diagnosis Date  . CKD (chronic kidney disease) stage 3, GFR 30-59 ml/min (HCC)   . ED (erectile dysfunction)   . GERD (gastroesophageal reflux disease)   . Heart murmur   . Hypertension   . Hypertriglyceridemia   . Rosacea    History reviewed. No pertinent surgical history.  Family History  Problem Relation Age of Onset  . Cancer Mother     SOCIAL HISTORY: Social History   Socioeconomic History  . Marital status: Married    Spouse name: Not on file  . Number of children: Not on file  . Years of education: Not on file  . Highest education level: Not on file  Occupational History  . Not on file  Tobacco Use  . Smoking status: Never Smoker  . Smokeless tobacco: Never Used  Vaping Use  . Vaping Use: Never used  Substance and Sexual Activity  . Alcohol use: Yes    Comment: 2 cocktails per day  . Drug use: No  . Sexual activity: Not on file  Other Topics Concern  . Not on file  Social History Narrative  . Not on file   Social Determinants of Health   Financial Resource Strain:   . Difficulty of Paying Living Expenses: Not on file  Food Insecurity:   . Worried About [employer] in the Last Year: Not on file  . Ran Out of Food in the Last Year: Not on file  Transportation Needs:   . Lack of Transportation (Medical): Not on file  . Lack of Transportation (Non-Medical): Not on file  Physical Activity:   . Days of Exercise  per Week: Not on file  . Minutes of Exercise per Session: Not on file  Stress:   . Feeling of Stress : Not on file  Social Connections:   . Frequency of Communication with Friends and Family: Not on file  . Frequency of Social Gatherings with Friends and Family: Not on file  . Attends Religious Services: Not on file  . Active Member of Clubs or Organizations: Not on file  . Attends Archivist Meetings: Not on file  . Marital Status: Not on file  Intimate Partner Violence:   . Fear of Current or Ex-Partner: Not on file  . Emotionally Abused: Not on file  . Physically Abused: Not on file  . Sexually Abused: Not on file    No Known Allergies  Current Outpatient Medications  Medication Sig Dispense Refill  . acetaminophen (TYLENOL) 650 MG CR tablet Take 650 mg by mouth every 8 (eight) hours as needed for pain.     Marland Kitchen amLODipine (NORVASC) 10 MG tablet Take 10 mg by mouth daily.    Marland Kitchen aspirin 81 MG tablet Take 81 mg by mouth every evening.     Marland Kitchen azithromycin (ZITHROMAX) 250 MG tablet Take 1 tablet (250 mg total) by mouth daily. 3 each 0  .  calcitRIOL (ROCALTROL) 0.25 MCG capsule Take 0.25 mcg by mouth daily.     . cefdinir (OMNICEF) 300 MG capsule Take 1 capsule (300 mg total) by mouth daily. 5 capsule 0  . cetirizine (ZYRTEC) 10 MG tablet Take 10 mg by mouth daily.    . diclofenac Sodium (VOLTAREN) 1 % GEL Apply 2 g topically 4 (four) times daily as needed (pain).    . fish oil-omega-3 fatty acids 1000 MG capsule Take 1,000 mg by mouth daily.     . fluticasone (FLONASE) 50 MCG/ACT nasal spray Place 1 spray into the nose daily as needed for allergies.     . furosemide (LASIX) 20 MG tablet Take 3 tablets (60 mg total) by mouth daily. 90 tablet 0  . Glucosamine-Chondroitin (GLUCOSAMINE CHONDR COMPLEX PO) Take 750 mg by mouth 2 (two) times daily.    . hydrALAZINE (APRESOLINE) 25 MG tablet Take 25 mg by mouth in the morning and at bedtime.     . metoprolol succinate (TOPROL-XL) 50 MG  24 hr tablet Take 50 mg by mouth every evening.   1  . olmesartan (BENICAR) 40 MG tablet Take 40 mg by mouth every morning.    . tamsulosin (FLOMAX) 0.4 MG CAPS capsule Take 1 capsule (0.4 mg total) by mouth daily after supper. 30 capsule 0   No current facility-administered medications for this visit.    ROS:   General:  No weight loss, Fever, chills  HEENT: No recent headaches, no nasal bleeding, no visual changes, no sore throat  Neurologic: No dizziness, blackouts, seizures. No recent symptoms of stroke or mini- stroke. No recent episodes of slurred speech, or temporary blindness.  Cardiac: No recent episodes of chest pain/pressure, no shortness of breath at rest.  No shortness of breath with exertion.  Denies history of atrial fibrillation or irregular heartbeat  Vascular: No history of rest pain in feet.  No history of claudication.  No history of non-healing ulcer, No history of DVT   Pulmonary: No home oxygen, no productive cough, no hemoptysis,  No asthma or wheezing  Musculoskeletal:  [ ]  Arthritis, [ ]  Low back pain,  [ ]  Joint pain  Hematologic:No history of hypercoagulable state.  No history of easy bleeding.  No history of anemia  Gastrointestinal: No hematochezia or melena,  No gastroesophageal reflux, no trouble swallowing  Urinary: [X]  chronic Kidney disease, [ ]  on HD - [ ]  MWF or [ ]  TTHS, [ ]  Burning with urination, [ ]  Frequent urination, [ ]  Difficulty urinating;   Skin: No rashes  Psychological: No history of anxiety,  No history of depression   Physical Examination  Vitals:   02/14/20 1350  BP: (!) 172/55  Pulse: 66  Resp: 20  Temp: 97.6 F (36.4 C)  SpO2: 94%  Weight: 131 lb 14.4 oz (59.8 kg)  Height: 5\' 7"  (1.702 m)    Body mass index is 20.66 kg/m.  General:  Alert and oriented, no acute distress HEENT: Normal Neck: No bruit or JVD Pulmonary: Clear to auscultation bilaterally Cardiac: Regular Rate and Rhythm without murmur Abdomen:  Soft, non-tender, non-distended, no mass, no scars Skin: No rash Extremity Pulses:  2+ radial, brachial, femoral, dorsalis pedis, posterior tibial pulses bilaterally Musculoskeletal: No deformity or edema  Neurologic: Upper and lower extremity motor 5/5 and symmetric  DATA:  Patient had an upper extremity arterial duplex exam today which I reviewed and interpreted.  This was normal.  He also had a vein mapping ultrasound today which showed  2-1/2 mm upper arm vein on the left side all of his veins were about 2-1/2 mm in diameter.  ASSESSMENT: Left brachiocephalic AV fistula scheduled for March 04, 2020.  Risk benefits possible complications and procedure details were discussed with patient and his wife today including not limited to bleeding infection ischemic steal.  I also discussed with him that with the vein diameter of 2-1/2 mm in size this may not grow large enough to mature.  I did discuss with him that I think it is worthwhile to try to get a fistula going in the upper arm and we have some time to try to get this to mature if it does not mature he would need an AV graft at some point in future.   PLAN: See above left brachiocephalic AV fistula March 04, 2020.   Ruta Hinds, MD [facility] [place] [facility] Office: [phone]

## 2020-02-18 ENCOUNTER — Other Ambulatory Visit: Payer: Self-pay | Admitting: *Deleted

## 2020-02-18 DIAGNOSIS — N184 Chronic kidney disease, stage 4 (severe): Secondary | ICD-10-CM

## 2020-03-03 ENCOUNTER — Other Ambulatory Visit (HOSPITAL_COMMUNITY)
Admission: RE | Admit: 2020-03-03 | Discharge: 2020-03-03 | Disposition: A | Discharge status: Home / Self Care | Payer: Medicare Other | Source: Ambulatory Visit | Attending: Vascular Surgery | Admitting: Vascular Surgery

## 2020-03-03 ENCOUNTER — Encounter (HOSPITAL_COMMUNITY): Payer: Self-pay | Admitting: Vascular Surgery

## 2020-03-03 ENCOUNTER — Other Ambulatory Visit: Payer: Self-pay

## 2020-03-03 DIAGNOSIS — Z01812 Encounter for preprocedural laboratory examination: Secondary | ICD-10-CM | POA: Insufficient documentation

## 2020-03-03 DIAGNOSIS — Z20822 Contact with and (suspected) exposure to covid-19: Secondary | ICD-10-CM | POA: Insufficient documentation

## 2020-03-03 LAB — SARS CORONAVIRUS 2 (TAT 6-24 HRS): SARS Coronavirus 2: NEGATIVE

## 2020-03-03 NOTE — Progress Notes (Signed)
Spoke with pt's wife, Jeffrey Payne for pre-op call. Pt asked me to talk with his wife. Pt has hx of HTN and "slight" heart murmur. She states pt is not diabetic.  States pt is hard of hearing.  Covid test done today, result pending.  Shestates he's been in quarantine since the test was done and understands that he stays in quarantine until he comes to the hospital tomorrow.

## 2020-03-04 ENCOUNTER — Ambulatory Visit (HOSPITAL_COMMUNITY)
Admission: RE | Admit: 2020-03-04 | Discharge: 2020-03-04 | Disposition: A | Discharge status: Home / Self Care | Payer: Medicare Other | Attending: Vascular Surgery | Admitting: Vascular Surgery

## 2020-03-04 ENCOUNTER — Other Ambulatory Visit: Payer: Self-pay

## 2020-03-04 ENCOUNTER — Encounter (HOSPITAL_COMMUNITY): Payer: Self-pay | Admitting: Vascular Surgery

## 2020-03-04 ENCOUNTER — Ambulatory Visit (HOSPITAL_COMMUNITY): Payer: Medicare Other | Admitting: Anesthesiology

## 2020-03-04 ENCOUNTER — Encounter (HOSPITAL_COMMUNITY)
Admission: RE | Disposition: A | Discharge status: Home / Self Care | Payer: Self-pay | Source: Home / Self Care | Attending: Vascular Surgery

## 2020-03-04 DIAGNOSIS — Z79899 Other long term (current) drug therapy: Secondary | ICD-10-CM | POA: Insufficient documentation

## 2020-03-04 DIAGNOSIS — N184 Chronic kidney disease, stage 4 (severe): Secondary | ICD-10-CM | POA: Diagnosis not present

## 2020-03-04 DIAGNOSIS — Z7982 Long term (current) use of aspirin: Secondary | ICD-10-CM | POA: Insufficient documentation

## 2020-03-04 HISTORY — DX: Unspecified osteoarthritis, unspecified site: M19.90

## 2020-03-04 HISTORY — PX: AV FISTULA PLACEMENT: SHX1204

## 2020-03-04 HISTORY — DX: Malignant (primary) neoplasm, unspecified: C80.1

## 2020-03-04 HISTORY — DX: Unspecified hearing loss, unspecified ear: H91.90

## 2020-03-04 LAB — POCT I-STAT, CHEM 8
BUN: 42 mg/dL — ABNORMAL HIGH (ref 8–23)
Calcium, Ion: 1.26 mmol/L (ref 1.15–1.40)
Chloride: 106 mmol/L (ref 98–111)
Creatinine, Ser: 3.3 mg/dL — ABNORMAL HIGH (ref 0.61–1.24)
Glucose, Bld: 104 mg/dL — ABNORMAL HIGH (ref 70–99)
HCT: 36 % — ABNORMAL LOW (ref 39.0–52.0)
Hemoglobin: 12.2 g/dL — ABNORMAL LOW (ref 13.0–17.0)
Potassium: 3.7 mmol/L (ref 3.5–5.1)
Sodium: 138 mmol/L (ref 135–145)
TCO2: 20 mmol/L — ABNORMAL LOW (ref 22–32)

## 2020-03-04 SURGERY — ARTERIOVENOUS (AV) FISTULA CREATION
Anesthesia: Monitor Anesthesia Care | Laterality: Left

## 2020-03-04 MED ORDER — SODIUM CHLORIDE 0.9 % IV SOLN
INTRAVENOUS | Status: AC
  Filled 2020-03-04: qty 1.2

## 2020-03-04 MED ORDER — PROPOFOL 10 MG/ML IV BOLUS
INTRAVENOUS | Status: DC | PRN
  Administered 2020-03-04 (×2): 20 mg via INTRAVENOUS
  Administered 2020-03-04: 40 mg via INTRAVENOUS

## 2020-03-04 MED ORDER — PROPOFOL 10 MG/ML IV BOLUS
INTRAVENOUS | Status: AC
  Filled 2020-03-04: qty 20

## 2020-03-04 MED ORDER — SODIUM CHLORIDE 0.9 % IV SOLN: INTRAVENOUS | Status: DC | PRN

## 2020-03-04 MED ORDER — CEFAZOLIN SODIUM-DEXTROSE 2-4 GM/100ML-% IV SOLN
2.0000 g | INTRAVENOUS | Status: AC
  Administered 2020-03-04: 2 g via INTRAVENOUS
  Filled 2020-03-04 (×2): qty 100

## 2020-03-04 MED ORDER — HEMOSTATIC AGENTS (NO CHARGE) OPTIME
TOPICAL | Status: DC | PRN
  Administered 2020-03-04: 1 via TOPICAL

## 2020-03-04 MED ORDER — SODIUM CHLORIDE 0.9 % IV SOLN: INTRAVENOUS | Status: DC

## 2020-03-04 MED ORDER — CHLORHEXIDINE GLUCONATE 4 % EX LIQD: 60.0000 mL | Freq: Once | CUTANEOUS | Status: DC

## 2020-03-04 MED ORDER — SODIUM CHLORIDE 0.9 % IV SOLN
INTRAVENOUS | Status: DC | PRN
  Administered 2020-03-04: 500 mL

## 2020-03-04 MED ORDER — LIDOCAINE HCL (PF) 1 % IJ SOLN
INTRAMUSCULAR | Status: DC | PRN
  Administered 2020-03-04: 8 mL

## 2020-03-04 MED ORDER — LIDOCAINE 2% (20 MG/ML) 5 ML SYRINGE
INTRAMUSCULAR | Status: DC | PRN
  Administered 2020-03-04: 20 mg via INTRAVENOUS

## 2020-03-04 MED ORDER — PHENYLEPHRINE 40 MCG/ML (10ML) SYRINGE FOR IV PUSH (FOR BLOOD PRESSURE SUPPORT)
PREFILLED_SYRINGE | INTRAVENOUS | Status: AC
  Filled 2020-03-04: qty 10

## 2020-03-04 MED ORDER — PHENYLEPHRINE HCL (PRESSORS) 10 MG/ML IV SOLN
INTRAVENOUS | Status: DC | PRN
  Administered 2020-03-04 (×2): 80 ug via INTRAVENOUS

## 2020-03-04 MED ORDER — HEPARIN SODIUM (PORCINE) 1000 UNIT/ML IJ SOLN
INTRAMUSCULAR | Status: AC
  Filled 2020-03-04: qty 1

## 2020-03-04 MED ORDER — HYDROCODONE-ACETAMINOPHEN 5-325 MG PO TABS: 1.0000 | ORAL_TABLET | Freq: Four times a day (QID) | ORAL | 0 refills | Status: DC | PRN

## 2020-03-04 MED ORDER — PROTAMINE SULFATE 10 MG/ML IV SOLN
INTRAVENOUS | Status: AC
  Filled 2020-03-04: qty 5

## 2020-03-04 MED ORDER — FENTANYL CITRATE (PF) 100 MCG/2ML IJ SOLN: 25.0000 ug | INTRAMUSCULAR | Status: DC | PRN

## 2020-03-04 MED ORDER — CHLORHEXIDINE GLUCONATE 0.12 % MT SOLN
OROMUCOSAL | Status: AC
  Administered 2020-03-04: 15 mL
  Filled 2020-03-04: qty 15

## 2020-03-04 MED ORDER — PROPOFOL 500 MG/50ML IV EMUL
INTRAVENOUS | Status: DC | PRN
  Administered 2020-03-04: 50 ug/kg/min via INTRAVENOUS

## 2020-03-04 MED ORDER — LIDOCAINE HCL (PF) 1 % IJ SOLN
INTRAMUSCULAR | Status: AC
  Filled 2020-03-04: qty 30

## 2020-03-04 MED ORDER — EPHEDRINE 5 MG/ML INJ
INTRAVENOUS | Status: AC
  Filled 2020-03-04: qty 10

## 2020-03-04 MED ORDER — FENTANYL CITRATE (PF) 250 MCG/5ML IJ SOLN
INTRAMUSCULAR | Status: AC
  Filled 2020-03-04: qty 5

## 2020-03-04 MED ORDER — PROTAMINE SULFATE 10 MG/ML IV SOLN
INTRAVENOUS | Status: DC | PRN
  Administered 2020-03-04: 40 mg via INTRAVENOUS
  Administered 2020-03-04: 10 mg via INTRAVENOUS

## 2020-03-04 MED ORDER — 0.9 % SODIUM CHLORIDE (POUR BTL) OPTIME
TOPICAL | Status: DC | PRN
  Administered 2020-03-04: 1000 mL

## 2020-03-04 MED ORDER — ACETAMINOPHEN 10 MG/ML IV SOLN: 1000.0000 mg | Freq: Once | INTRAVENOUS | Status: DC | PRN

## 2020-03-04 MED ORDER — HEPARIN SODIUM (PORCINE) 1000 UNIT/ML IJ SOLN
INTRAMUSCULAR | Status: DC | PRN
  Administered 2020-03-04: 5000 [IU] via INTRAVENOUS

## 2020-03-04 MED ORDER — FENTANYL CITRATE (PF) 250 MCG/5ML IJ SOLN
INTRAMUSCULAR | Status: DC | PRN
  Administered 2020-03-04: 25 ug via INTRAVENOUS

## 2020-03-04 MED ORDER — ONDANSETRON HCL 4 MG/2ML IJ SOLN: 4.0000 mg | Freq: Once | INTRAMUSCULAR | Status: DC | PRN

## 2020-03-04 MED ORDER — EPHEDRINE SULFATE 50 MG/ML IJ SOLN
INTRAMUSCULAR | Status: DC | PRN
  Administered 2020-03-04 (×2): 10 mg via INTRAVENOUS

## 2020-03-04 SURGICAL SUPPLY — 44 items
ADH SKN CLS APL DERMABOND .7 (GAUZE/BANDAGES/DRESSINGS)
AGENT HMST SPONGE THK3/8 (HEMOSTASIS) ×1
ARMBAND PINK RESTRICT EXTREMIT (MISCELLANEOUS) ×2 IMPLANT
BNDG ELASTIC 4X5.8 VLCR STR LF (GAUZE/BANDAGES/DRESSINGS) ×2 IMPLANT
CANISTER SUCT 3000ML PPV (MISCELLANEOUS) ×2 IMPLANT
CANNULA VESSEL 3MM 2 BLNT TIP (CANNULA) ×2 IMPLANT
CLIP VESOCCLUDE MED 6/CT (CLIP) ×2 IMPLANT
CLIP VESOCCLUDE SM WIDE 6/CT (CLIP) ×2 IMPLANT
COVER PROBE W GEL 5X96 (DRAPES) ×2 IMPLANT
COVER WAND RF STERILE (DRAPES) IMPLANT
DECANTER SPIKE VIAL GLASS SM (MISCELLANEOUS) ×2 IMPLANT
DERMABOND ADVANCED (GAUZE/BANDAGES/DRESSINGS)
DERMABOND ADVANCED .7 DNX12 (GAUZE/BANDAGES/DRESSINGS) IMPLANT
DRAIN PENROSE 1/4X12 LTX STRL (WOUND CARE) ×2 IMPLANT
DRSG XEROFORM 1X8 (GAUZE/BANDAGES/DRESSINGS) ×2 IMPLANT
ELECT REM PT RETURN 9FT ADLT (ELECTROSURGICAL) ×2
ELECTRODE REM PT RTRN 9FT ADLT (ELECTROSURGICAL) ×1 IMPLANT
GAUZE SPONGE 4X4 12PLY STRL LF (GAUZE/BANDAGES/DRESSINGS) ×2 IMPLANT
GLOVE BIO SURGEON STRL SZ7.5 (GLOVE) ×4 IMPLANT
GLOVE BIOGEL PI IND STRL 6.5 (GLOVE) ×1 IMPLANT
GLOVE BIOGEL PI INDICATOR 6.5 (GLOVE) ×1
GOWN SPEC L3 XXLG W/TWL (GOWN DISPOSABLE) ×2 IMPLANT
GOWN SPEC L4 XLG W/TWL (GOWN DISPOSABLE) ×2 IMPLANT
GOWN STRL REUS W/ TWL LRG LVL3 (GOWN DISPOSABLE) ×1 IMPLANT
GOWN STRL REUS W/TWL LRG LVL3 (GOWN DISPOSABLE) ×2
HEMOSTAT SPONGE AVITENE ULTRA (HEMOSTASIS) ×2 IMPLANT
KIT BASIN OR (CUSTOM PROCEDURE TRAY) ×2 IMPLANT
KIT TURNOVER KIT B (KITS) ×2 IMPLANT
LOOP VESSEL MINI RED (MISCELLANEOUS) IMPLANT
NS IRRIG 1000ML POUR BTL (IV SOLUTION) ×2 IMPLANT
PACK CV ACCESS (CUSTOM PROCEDURE TRAY) ×2 IMPLANT
PAD ARMBOARD 7.5X6 YLW CONV (MISCELLANEOUS) ×4 IMPLANT
SPONGE SURGIFOAM ABS GEL 100 (HEMOSTASIS) IMPLANT
SUT ETHILON 4 0 PS 2 18 (SUTURE) ×6 IMPLANT
SUT PROLENE 6 0 CC (SUTURE) ×2 IMPLANT
SUT PROLENE 7 0 BV 1 (SUTURE) IMPLANT
SUT SILK 3 0 (SUTURE) ×2
SUT SILK 3-0 18XBRD TIE 12 (SUTURE) ×1 IMPLANT
SUT VIC AB 3-0 SH 27 (SUTURE) ×2
SUT VIC AB 3-0 SH 27X BRD (SUTURE) ×1 IMPLANT
SUT VICRYL 4-0 PS2 18IN ABS (SUTURE) ×2 IMPLANT
TOWEL GREEN STERILE (TOWEL DISPOSABLE) ×2 IMPLANT
UNDERPAD 30X36 HEAVY ABSORB (UNDERPADS AND DIAPERS) ×2 IMPLANT
WATER STERILE IRR 1000ML POUR (IV SOLUTION) ×2 IMPLANT

## 2020-03-04 NOTE — Op Note (Signed)
Procedure: Left brachiocephalic AV fistula  Preoperative diagnosis: CKD4  Postoperative diagnosis: Same  Anesthesia: Local with IV sedation  Assistant: Arlee Muslim, PA-C for assistance with retraction and expediting procedure.  Operative findings: 3 mm left cephalic vein  Very thin friable skin  Operative details: After obtaining form consent: The patient taken the operating.  The patient was placed supine position operating table.  After adequate sedation patient's left upper extremities prepped and draped in usual sterile fashion.  Ultrasound was used to mark the course of the left cephalic vein with a Penrose drain used as a tourniquet on the upper arm.  The vein was about 3 mm in diameter and appear patent throughout its course.  Penrose was removed.  Local anesthesia was infiltrated near the left antecubital crease.  A transverse incision was made in this location carried on through subcutaneous tissues down the level of the cephalic vein.  Multiple side branches were ligated divided tween silk ties.  The brachial artery was then dissected free in the medial portion incision.  This was slightly thickened on palpation but did have a good pulse within it.  Basilic vein was adjacent to this and this was retracted laterally to protect this.  Patient was given 5000 intravenous heparin.  Vesseloops were used to control the brachial artery proximally distally.  Longitudinal opening was made in the brachial artery vein was transected and swung over the level of the artery and sewn end-to-side to the artery using a running 6-0 Prolene suture.  Just prior to completion anastomosis it was for blood backbled and thoroughly flushed reanastomosed was secured clamps released there is palpable thrill in fistula immediately.  Patient was given 50 mg of protamine Avitene was also applied to achieve hemostasis.  3-0 Vicryl suture was used to close the subcutaneous tissues.  Since the patient had very thin friable  fragile skin it was closed with simple 4-0 nylon sutures.  Dry sterile dressing was applied.  Patient tired procedure well and there were no complications.  Dental sponge and needle counts correct in the case.  Patient was taken to recovery in stable condition.  Ruta Hinds, MD [facility] Office: [phone]

## 2020-03-04 NOTE — Transfer of Care (Signed)
Immediate Anesthesia Transfer of Care Note  Patient: Jeffrey Payne  Procedure(s) Performed: BRACHIOCEPHALIC ARTERIOVENOUS (AV) FISTULA CREATION LEFT (Left )  Patient Location: PACU  Anesthesia Type:MAC  Level of Consciousness: awake and alert   Airway & Oxygen Therapy: Patient Spontanous Breathing and Patient connected to face mask oxygen  Post-op Assessment: Report given to RN and Post -op Vital signs reviewed and stable  Post vital signs: Reviewed and stable  Last Vitals:  Vitals Value Taken Time  BP 121/47 03/04/20 1114  Temp    Pulse 55 03/04/20 1114  Resp 15 03/04/20 1114  SpO2 98 % 03/04/20 1114  Vitals shown include unvalidated device data.  Last Pain:  Vitals:   03/04/20 0843  TempSrc:   PainSc: 0-No pain         Complications: No complications documented.

## 2020-03-04 NOTE — Discharge Instructions (Signed)
° °  [facility] ° °Discharge Instructions ° °AV Fistula or Graft Surgery for Dialysis Access ° °Please refer to the following instructions for your post-procedure care. Your surgeon or physician assistant will discuss any changes with you. ° °Activity ° °You may drive the day following your surgery, if you are comfortable and no longer taking prescription pain medication. Resume full activity as the soreness in your incision resolves. ° °Bathing/Showering ° °You may shower after you go home. Keep your incision dry for 48 hours. Do not soak in a bathtub, hot tub, or swim until the incision heals completely. You may not shower if you have a hemodialysis catheter. ° °Incision Care ° °Clean your incision with mild soap and water after 48 hours. Pat the area dry with a clean towel. You do not need a bandage unless otherwise instructed. Do not apply any ointments or creams to your incision. You may have skin glue on your incision. Do not peel it off. It will come off on its own in about one week. Your arm may swell a bit after surgery. To reduce swelling use pillows to elevate your arm so it is above your heart. Your doctor will tell you if you need to lightly wrap your arm with an ACE bandage. ° °Diet ° °Resume your normal diet. There are not special food restrictions following this procedure. In order to heal from your surgery, it is CRITICAL to get adequate nutrition. Your body requires vitamins, minerals, and protein. Vegetables are the best source of vitamins and minerals. Vegetables also provide the perfect balance of protein. Processed food has little nutritional value, so try to avoid this. ° °Medications ° °Resume taking all of your medications. If your incision is causing pain, you may take over-the counter pain relievers such as acetaminophen (Tylenol). If you were prescribed a stronger pain medication, please be aware these medications can cause nausea and constipation. Prevent  nausea by taking the medication with a snack or meal. Avoid constipation by drinking plenty of fluids and eating foods with high amount of fiber, such as fruits, vegetables, and grains. Do not take Tylenol if you are taking prescription pain medications. ° ° ° ° °Follow up °Your surgeon may want to see you in the office following your access surgery. If so, this will be arranged at the time of your surgery. ° °Please call us immediately for any of the following conditions: ° °Increased pain, redness, drainage (pus) from your incision site °Fever of 101 degrees or higher °Severe or worsening pain at your incision site °Hand pain or numbness. ° °Reduce your risk of vascular disease: ° °Stop smoking. If you would like help, call [facility] at 1-800-QUIT-NOW (1-800-784-8669) or [facility] at 336-586-4000 ° °Manage your cholesterol °Maintain a desired weight °Control your diabetes °Keep your blood pressure down ° °Dialysis ° °It will take several weeks to several months for your new dialysis access to be ready for use. Your surgeon will determine when it is OK to use it. Your nephrologist will continue to direct your dialysis. You can continue to use your Permcath until your new access is ready for use. ° °If you have any questions, please call the office at [phone]. ° °

## 2020-03-04 NOTE — Anesthesia Preprocedure Evaluation (Addendum)
Anesthesia Evaluation  Patient identified by MRN, date of birth, ID band Patient awake    Reviewed: Allergy & Precautions, NPO status , Patient's Chart, lab work & pertinent test results  Airway Mallampati: III  TM Distance: >3 FB Neck ROM: Full    Dental no notable dental hx.    Pulmonary neg pulmonary ROS,    Pulmonary exam normal breath sounds clear to auscultation       Cardiovascular hypertension, Pt. on medications and Pt. on home beta blockers Normal cardiovascular exam Rhythm:Regular Rate:Normal  ECG: SR    Neuro/Psych negative neurological ROS  negative psych ROS   GI/Hepatic Neg liver ROS, GERD  Medicated and Controlled,  Endo/Other  negative endocrine ROS  Renal/GU CRFRenal disease     Musculoskeletal negative musculoskeletal ROS (+)   Abdominal   Peds  Hematology  (+) anemia ,   Anesthesia Other Findings Renal Disease  Reproductive/Obstetrics                            Anesthesia Physical Anesthesia Plan  ASA: III  Anesthesia Plan: MAC   Post-op Pain Management:    Induction: Intravenous  PONV Risk Score and Plan: 1 and Ondansetron, Dexamethasone and Treatment may vary due to age or medical condition  Airway Management Planned: Simple Face Mask  Additional Equipment:   Intra-op Plan:   Post-operative Plan:   Informed Consent: I have reviewed the patients History and Physical, chart, labs and discussed the procedure including the risks, benefits and alternatives for the proposed anesthesia with the patient or authorized representative who has indicated his/her understanding and acceptance.     Dental advisory given  Plan Discussed with: CRNA  Anesthesia Plan Comments:        Anesthesia Quick Evaluation

## 2020-03-04 NOTE — Interval H&P Note (Signed)
History and Physical Interval Note:  03/04/2020 9:16 AM  Jeffrey Payne  has presented today for surgery, with the diagnosis of Renal Disease.  The various methods of treatment have been discussed with the patient and family. After consideration of risks, benefits and other options for treatment, the patient has consented to  Procedure(s): ARTERIOVENOUS (AV) FISTULA CREATION LEFT (Left) as a surgical intervention.  The patient's history has been reviewed, patient examined, no change in status, stable for surgery.  I have reviewed the patient's chart and labs.  Questions were answered to the patient's satisfaction.     Ruta Hinds

## 2020-03-04 NOTE — Anesthesia Postprocedure Evaluation (Signed)
Anesthesia Post Note  Patient: Jeffrey Payne  Procedure(s) Performed: BRACHIOCEPHALIC ARTERIOVENOUS (AV) FISTULA CREATION LEFT (Left )     Patient location during evaluation: PACU Anesthesia Type: MAC Level of consciousness: awake Pain management: pain level controlled Vital Signs Assessment: post-procedure vital signs reviewed and stable Respiratory status: spontaneous breathing, nonlabored ventilation, respiratory function stable and patient connected to nasal cannula oxygen Cardiovascular status: stable and blood pressure returned to baseline Postop Assessment: no apparent nausea or vomiting Anesthetic complications: no   No complications documented.  Last Vitals:  Vitals:   03/04/20 1130 03/04/20 1145  BP: (!) 131/46 (!) 133/43  Pulse: (!) 56 (!) 56  Resp: 13 14  Temp:  (!) 36.1 C  SpO2: 94% 93%    Last Pain:  Vitals:   03/04/20 1145  TempSrc:   PainSc: 0-No pain                 Lachelle Rissler P Mikaylee Arseneau

## 2020-03-05 ENCOUNTER — Encounter (HOSPITAL_COMMUNITY): Payer: Self-pay | Admitting: Vascular Surgery

## 2020-03-05 ENCOUNTER — Telehealth: Payer: Self-pay

## 2020-03-05 NOTE — Telephone Encounter (Signed)
Patient's wife called to ask about the bandages from the fistula placement yesterday. Said she could take off the ace wrap and gauze and if there was no bleeding - leave it off. She took it off while we were on the phone and she said the site looked okay and there was no bleeding.They can keep the stitches covered with a clean dry gauze. Do not shower until tomorrow and can clean site with mild soap and water. Stitches will be removed when they return for a post op follow up in 2 weeks. Verbalizes understanding.

## 2020-03-18 ENCOUNTER — Encounter (HOSPITAL_COMMUNITY): Payer: Medicare Other

## 2020-03-18 ENCOUNTER — Ambulatory Visit (INDEPENDENT_AMBULATORY_CARE_PROVIDER_SITE_OTHER): Payer: Self-pay | Admitting: Physician Assistant

## 2020-03-18 ENCOUNTER — Other Ambulatory Visit: Payer: Self-pay

## 2020-03-18 VITALS — BP 162/55 | HR 70 | Temp 98.5°F | Ht 66.0 in | Wt 137.3 lb

## 2020-03-18 DIAGNOSIS — N184 Chronic kidney disease, stage 4 (severe): Secondary | ICD-10-CM

## 2020-03-18 NOTE — Progress Notes (Signed)
  POST OPERATIVE DIALYSIS ACCESS OFFICE NOTE    CC:  F/u for dialysis access surgery  HPI:  This is a 86 y.o. male who is s/p left brachiocephalic AV fistula on 16/08/5372 by Dr. Oneida Alar. His wife accompanies him today, He denies hand pain or numbness. He is not yet requiring hemodialysis  No Known Allergies  Current Outpatient Medications  Medication Sig Dispense Refill  . acetaminophen (TYLENOL) 650 MG CR tablet Take 650 mg by mouth in the morning and at bedtime.     Marland Kitchen amLODipine (NORVASC) 10 MG tablet Take 10 mg by mouth daily.    . calcitRIOL (ROCALTROL) 0.25 MCG capsule Take 0.25 mcg by mouth daily.     . cetirizine (ZYRTEC) 10 MG tablet Take 10 mg by mouth daily.    . fluticasone (FLONASE) 50 MCG/ACT nasal spray Place 1 spray into the nose daily.     . furosemide (LASIX) 20 MG tablet Take 3 tablets (60 mg total) by mouth daily. (Patient taking differently: Take 20 mg by mouth daily. ) 90 tablet 0  . Homeopathic Products (ARNICARE ARNICA) CREA Apply 1 application topically daily as needed (arthritis pain).    . hydrALAZINE (APRESOLINE) 25 MG tablet Take 25 mg by mouth 3 (three) times daily.     Marland Kitchen HYDROcodone-acetaminophen (NORCO) 5-325 MG tablet Take 1 tablet by mouth every 6 (six) hours as needed for moderate pain. 10 tablet 0  . metoprolol succinate (TOPROL-XL) 50 MG 24 hr tablet Take 50 mg by mouth at bedtime.   1  . tamsulosin (FLOMAX) 0.4 MG CAPS capsule Take 1 capsule (0.4 mg total) by mouth daily after supper. (Patient taking differently: Take 0.4 mg by mouth at bedtime. ) 30 capsule 0   No current facility-administered medications for this visit.     ROS:  See HPI  BP (!) 162/55 (BP Location: Right Arm)   Pulse 70   Temp 98.5 F (36.9 C) (Skin)   Ht 5\' 6"  (1.676 m)   Wt 62.3 kg   SpO2 95%   BMI 22.16 kg/m    Physical Exam:  General appearance: Well-developed well-nourished in no apparent distress Cardiac: Rate and rhythm are regular Respiratory: Respirations are  nonlabored Incision: Healing without signs of infection or dehiscence Extremities: Left upper extremity: Edema noted of volar aspect of forearm and dorsal aspect of left hand. This is soft and ballotable. He has a 2+ radial pulse. His hand grip strength is 5/5. There is a good bruit and thrill in the fistula.        Assessment/Plan:   -pt does not have evidence of steal syndrome -He has mild edema/subcutaneous fluid without signs of infection. -Recommend elevating left arm as much as possible -He has an appointment for routine follow-up and duplex of left upper extremity AV fistula on April 15, 2020  Barbie Banner, [place] 03/18/2020 9:14 AM [facility] [phone]  Clinic MD:  Carlis Abbott

## 2020-04-04 ENCOUNTER — Other Ambulatory Visit: Payer: Self-pay | Admitting: *Deleted

## 2020-04-04 DIAGNOSIS — N184 Chronic kidney disease, stage 4 (severe): Secondary | ICD-10-CM

## 2020-04-15 ENCOUNTER — Other Ambulatory Visit: Payer: Self-pay

## 2020-04-15 ENCOUNTER — Ambulatory Visit (HOSPITAL_COMMUNITY)
Admission: RE | Admit: 2020-04-15 | Discharge: 2020-04-15 | Disposition: A | Discharge status: Home / Self Care | Payer: Medicare Other | Source: Ambulatory Visit | Attending: Physician Assistant | Admitting: Physician Assistant

## 2020-04-15 ENCOUNTER — Ambulatory Visit (INDEPENDENT_AMBULATORY_CARE_PROVIDER_SITE_OTHER): Payer: Self-pay | Admitting: Physician Assistant

## 2020-04-15 VITALS — BP 162/56 | HR 67 | Temp 97.6°F | Resp 20 | Ht 66.0 in | Wt 140.4 lb

## 2020-04-15 DIAGNOSIS — N184 Chronic kidney disease, stage 4 (severe): Secondary | ICD-10-CM

## 2020-04-15 NOTE — Progress Notes (Signed)
POST OPERATIVE OFFICE NOTE    CC:  F/u for surgery  HPI:  This is a 86 y.o. male who is s/p left BC AVF on 03/04/2020 by Dr. Oneida Alar.  He was seen on 03/18/2020 for mild edema and subcutaneous fluid without signs of infection.  He was instructed to elevate his arm as much as possible and keep follow up appt for today.    Pt states he does not have pain/numbness in the left hand.  He does have subcutaneous fluid on both arms.   The pt is not yet on dialysis.  Dr. Larose Hires is his nephrologist and he has an appointment with him on Thursday.    No Known Allergies  Current Outpatient Medications  Medication Sig Dispense Refill  . acetaminophen (TYLENOL) 650 MG CR tablet Take 650 mg by mouth in the morning and at bedtime.     Marland Kitchen amLODipine (NORVASC) 10 MG tablet Take 10 mg by mouth daily.    . calcitRIOL (ROCALTROL) 0.25 MCG capsule Take 0.25 mcg by mouth daily.     . cetirizine (ZYRTEC) 10 MG tablet Take 10 mg by mouth daily.    . fluticasone (FLONASE) 50 MCG/ACT nasal spray Place 1 spray into the nose daily.     . furosemide (LASIX) 20 MG tablet Take 3 tablets (60 mg total) by mouth daily. (Patient taking differently: Take 20 mg by mouth daily. ) 90 tablet 0  . Homeopathic Products (ARNICARE ARNICA) CREA Apply 1 application topically daily as needed (arthritis pain).    . hydrALAZINE (APRESOLINE) 25 MG tablet Take 25 mg by mouth 3 (three) times daily.     Marland Kitchen HYDROcodone-acetaminophen (NORCO) 5-325 MG tablet Take 1 tablet by mouth every 6 (six) hours as needed for moderate pain. 10 tablet 0  . metoprolol succinate (TOPROL-XL) 50 MG 24 hr tablet Take 50 mg by mouth at bedtime.   1  . tamsulosin (FLOMAX) 0.4 MG CAPS capsule Take 1 capsule (0.4 mg total) by mouth daily after supper. (Patient taking differently: Take 0.4 mg by mouth at bedtime. ) 30 capsule 0   No current facility-administered medications for this visit.     ROS:  See HPI  Physical Exam:  Today's Vitals   04/15/20 1021   BP: (!) 162/56  Pulse: 67  Resp: 20  Temp: 97.6 F (36.4 C)  TempSrc: Temporal  SpO2: 98%  Weight: 140 lb 6.4 oz (63.7 kg)  Height: 5\' 6"  (1.676 m)  PainSc: 3   PainLoc: Arm   Body mass index is 22.66 kg/m.   Incision:  Well healed Extremities:   There is a palpable left radial pulse.   Motor and sensory are in tact.  Hand grips are equal bilaterally Subcu fluid bilateral arms. There is a thrill/bruit present.  The fistula is easily palpable   Dialysis Duplex on 04/15/2020: Diameter:  0.47-0.55cm Depth:  0.27-0.32cm   Assessment/Plan:  This is a 86 y.o. male who is s/p:  left BC AVF on 03/04/2020 by Dr. Oneida Alar.  -the pt does not have evidence of steal. -the fistula is not quite mature, but is getting close.  Instructed him to exercise his hand with squeeze ball.  -the fistula can be used 06/04/2020.  It still has 6 more weeks to mature and feel it will be ready.  If he needs dialysis prior to that date, he and his wife understand that he would need a tunneled dialysis catheter.  -if at that time, it can't be accessed or they  feel it is not ready, we will see him back.   -I discussed this with he and his wife.  We also discussed that these do not last forever and most likely will need intervention in the future. They both expressed understanding. -the pt will follow up as needed.    Leontine Locket, [facility] [phone]  Clinic MD:  Carlis Abbott

## 2020-04-20 ENCOUNTER — Emergency Department (HOSPITAL_COMMUNITY): Payer: Medicare Other

## 2020-04-20 ENCOUNTER — Other Ambulatory Visit: Payer: Self-pay

## 2020-04-20 ENCOUNTER — Encounter (HOSPITAL_COMMUNITY): Payer: Self-pay | Admitting: Emergency Medicine

## 2020-04-20 ENCOUNTER — Inpatient Hospital Stay (HOSPITAL_COMMUNITY)
Admission: EM | Admit: 2020-04-20 | Discharge: 2020-04-22 | DRG: 683 | Disposition: A | Discharge status: Home / Self Care | Payer: Medicare Other | Attending: Internal Medicine | Admitting: Internal Medicine

## 2020-04-20 DIAGNOSIS — N2581 Secondary hyperparathyroidism of renal origin: Secondary | ICD-10-CM | POA: Diagnosis present

## 2020-04-20 DIAGNOSIS — R338 Other retention of urine: Secondary | ICD-10-CM | POA: Diagnosis present

## 2020-04-20 DIAGNOSIS — N185 Chronic kidney disease, stage 5: Secondary | ICD-10-CM

## 2020-04-20 DIAGNOSIS — N401 Enlarged prostate with lower urinary tract symptoms: Secondary | ICD-10-CM | POA: Diagnosis present

## 2020-04-20 DIAGNOSIS — I313 Pericardial effusion (noninflammatory): Secondary | ICD-10-CM | POA: Diagnosis not present

## 2020-04-20 DIAGNOSIS — N184 Chronic kidney disease, stage 4 (severe): Secondary | ICD-10-CM | POA: Diagnosis present

## 2020-04-20 DIAGNOSIS — E8770 Fluid overload, unspecified: Secondary | ICD-10-CM | POA: Diagnosis present

## 2020-04-20 DIAGNOSIS — Z79899 Other long term (current) drug therapy: Secondary | ICD-10-CM

## 2020-04-20 DIAGNOSIS — M170 Bilateral primary osteoarthritis of knee: Secondary | ICD-10-CM | POA: Diagnosis present

## 2020-04-20 DIAGNOSIS — J90 Pleural effusion, not elsewhere classified: Secondary | ICD-10-CM | POA: Diagnosis present

## 2020-04-20 DIAGNOSIS — H9190 Unspecified hearing loss, unspecified ear: Secondary | ICD-10-CM | POA: Diagnosis present

## 2020-04-20 DIAGNOSIS — N179 Acute kidney failure, unspecified: Principal | ICD-10-CM | POA: Diagnosis present

## 2020-04-20 DIAGNOSIS — I10 Essential (primary) hypertension: Secondary | ICD-10-CM

## 2020-04-20 DIAGNOSIS — D631 Anemia in chronic kidney disease: Secondary | ICD-10-CM | POA: Diagnosis present

## 2020-04-20 DIAGNOSIS — Z9114 Patient's other noncompliance with medication regimen: Secondary | ICD-10-CM

## 2020-04-20 DIAGNOSIS — Z8546 Personal history of malignant neoplasm of prostate: Secondary | ICD-10-CM

## 2020-04-20 DIAGNOSIS — Z20822 Contact with and (suspected) exposure to covid-19: Secondary | ICD-10-CM | POA: Diagnosis present

## 2020-04-20 DIAGNOSIS — E781 Pure hyperglyceridemia: Secondary | ICD-10-CM | POA: Diagnosis present

## 2020-04-20 DIAGNOSIS — C880 Waldenstrom macroglobulinemia: Secondary | ICD-10-CM | POA: Diagnosis present

## 2020-04-20 DIAGNOSIS — I770 Arteriovenous fistula, acquired: Secondary | ICD-10-CM | POA: Diagnosis present

## 2020-04-20 DIAGNOSIS — D729 Disorder of white blood cells, unspecified: Secondary | ICD-10-CM

## 2020-04-20 DIAGNOSIS — D72829 Elevated white blood cell count, unspecified: Secondary | ICD-10-CM | POA: Diagnosis present

## 2020-04-20 DIAGNOSIS — N138 Other obstructive and reflux uropathy: Secondary | ICD-10-CM | POA: Diagnosis present

## 2020-04-20 DIAGNOSIS — D472 Monoclonal gammopathy: Secondary | ICD-10-CM | POA: Diagnosis present

## 2020-04-20 DIAGNOSIS — I440 Atrioventricular block, first degree: Secondary | ICD-10-CM | POA: Diagnosis present

## 2020-04-20 DIAGNOSIS — E872 Acidosis: Secondary | ICD-10-CM | POA: Diagnosis present

## 2020-04-20 DIAGNOSIS — I3139 Other pericardial effusion (noninflammatory): Secondary | ICD-10-CM

## 2020-04-20 DIAGNOSIS — R339 Retention of urine, unspecified: Secondary | ICD-10-CM

## 2020-04-20 DIAGNOSIS — K219 Gastro-esophageal reflux disease without esophagitis: Secondary | ICD-10-CM | POA: Diagnosis present

## 2020-04-20 DIAGNOSIS — R140 Abdominal distension (gaseous): Secondary | ICD-10-CM | POA: Diagnosis present

## 2020-04-20 DIAGNOSIS — I129 Hypertensive chronic kidney disease with stage 1 through stage 4 chronic kidney disease, or unspecified chronic kidney disease: Secondary | ICD-10-CM | POA: Diagnosis present

## 2020-04-20 DIAGNOSIS — R0609 Other forms of dyspnea: Secondary | ICD-10-CM | POA: Diagnosis present

## 2020-04-20 LAB — COMPREHENSIVE METABOLIC PANEL
ALT: 13 U/L (ref 0–44)
AST: 19 U/L (ref 15–41)
Albumin: 3.6 g/dL (ref 3.5–5.0)
Alkaline Phosphatase: 88 U/L (ref 38–126)
Anion gap: 14 (ref 5–15)
BUN: 68 mg/dL — ABNORMAL HIGH (ref 8–23)
CO2: 19 mmol/L — ABNORMAL LOW (ref 22–32)
Calcium: 10 mg/dL (ref 8.9–10.3)
Chloride: 100 mmol/L (ref 98–111)
Creatinine, Ser: 4.41 mg/dL — ABNORMAL HIGH (ref 0.61–1.24)
GFR, Estimated: 12 mL/min — ABNORMAL LOW (ref >60)
Glucose, Bld: 104 mg/dL — ABNORMAL HIGH (ref 70–99)
Potassium: 4.8 mmol/L (ref 3.5–5.1)
Sodium: 133 mmol/L — ABNORMAL LOW (ref 135–145)
Total Bilirubin: 0.8 mg/dL (ref 0.3–1.2)
Total Protein: 7 g/dL (ref 6.5–8.1)

## 2020-04-20 LAB — RESP PANEL BY RT-PCR (FLU A&B, COVID) ARPGX2
Influenza A by PCR: NEGATIVE
Influenza B by PCR: NEGATIVE
SARS Coronavirus 2 by RT PCR: NEGATIVE

## 2020-04-20 LAB — URINALYSIS, ROUTINE W REFLEX MICROSCOPIC
Bacteria, UA: NONE SEEN
Bilirubin Urine: NEGATIVE
Glucose, UA: NEGATIVE mg/dL
Hgb urine dipstick: NEGATIVE
Ketones, ur: NEGATIVE mg/dL
Leukocytes,Ua: NEGATIVE
Nitrite: NEGATIVE
Protein, ur: 100 mg/dL — AB
Specific Gravity, Urine: 1.011 (ref 1.005–1.030)
pH: 5 (ref 5.0–8.0)

## 2020-04-20 LAB — CBC WITH DIFFERENTIAL/PLATELET
Abs Immature Granulocytes: 0.08 10*3/uL — ABNORMAL HIGH (ref 0.00–0.07)
Basophils Absolute: 0 10*3/uL (ref 0.0–0.1)
Basophils Relative: 0 %
Eosinophils Absolute: 0 10*3/uL (ref 0.0–0.5)
Eosinophils Relative: 0 %
HCT: 35.1 % — ABNORMAL LOW (ref 39.0–52.0)
Hemoglobin: 11.4 g/dL — ABNORMAL LOW (ref 13.0–17.0)
Immature Granulocytes: 0 %
Lymphocytes Relative: 6 %
Lymphs Abs: 1.3 10*3/uL (ref 0.7–4.0)
MCH: 31.8 pg (ref 26.0–34.0)
MCHC: 32.5 g/dL (ref 30.0–36.0)
MCV: 98 fL (ref 80.0–100.0)
Monocytes Absolute: 1.8 10*3/uL — ABNORMAL HIGH (ref 0.1–1.0)
Monocytes Relative: 9 %
Neutro Abs: 16.6 10*3/uL — ABNORMAL HIGH (ref 1.7–7.7)
Neutrophils Relative %: 85 %
Platelets: 305 10*3/uL (ref 150–400)
RBC: 3.58 MIL/uL — ABNORMAL LOW (ref 4.22–5.81)
RDW: 14.7 % (ref 11.5–15.5)
WBC: 19.7 10*3/uL — ABNORMAL HIGH (ref 4.0–10.5)
nRBC: 0 % (ref 0.0–0.2)

## 2020-04-20 LAB — LIPASE, BLOOD: Lipase: 32 U/L (ref 11–51)

## 2020-04-20 LAB — BRAIN NATRIURETIC PEPTIDE: B Natriuretic Peptide: 2566.7 pg/mL — ABNORMAL HIGH (ref 0.0–100.0)

## 2020-04-20 MED ORDER — FUROSEMIDE 10 MG/ML IJ SOLN: 60.0000 mg | Freq: Two times a day (BID) | INTRAMUSCULAR | Status: DC

## 2020-04-20 MED ORDER — ACETAMINOPHEN 325 MG PO TABS: 325.0000 mg | ORAL_TABLET | Freq: Four times a day (QID) | ORAL | Status: DC | PRN

## 2020-04-20 MED ORDER — CALCITRIOL 0.25 MCG PO CAPS
0.2500 ug | ORAL_CAPSULE | Freq: Every day | ORAL | Status: DC
  Administered 2020-04-21 – 2020-04-22 (×2): 0.25 ug via ORAL
  Filled 2020-04-20 (×2): qty 1

## 2020-04-20 MED ORDER — METOPROLOL SUCCINATE ER 50 MG PO TB24
50.0000 mg | ORAL_TABLET | Freq: Every day | ORAL | Status: DC
  Administered 2020-04-20 – 2020-04-21 (×2): 50 mg via ORAL
  Filled 2020-04-20 (×2): qty 1

## 2020-04-20 MED ORDER — POLYETHYLENE GLYCOL 3350 17 G PO PACK
17.0000 g | PACK | Freq: Two times a day (BID) | ORAL | Status: DC
  Administered 2020-04-20 – 2020-04-21 (×2): 17 g via ORAL
  Filled 2020-04-20 (×2): qty 1

## 2020-04-20 MED ORDER — ADULT MULTIVITAMIN W/MINERALS CH
1.0000 | ORAL_TABLET | Freq: Every day | ORAL | Status: DC
  Administered 2020-04-21 – 2020-04-22 (×2): 1 via ORAL
  Filled 2020-04-20 (×2): qty 1

## 2020-04-20 MED ORDER — FOLIC ACID 1 MG PO TABS
1.0000 mg | ORAL_TABLET | Freq: Every day | ORAL | Status: DC
  Administered 2020-04-21 – 2020-04-22 (×2): 1 mg via ORAL
  Filled 2020-04-20 (×2): qty 1

## 2020-04-20 MED ORDER — SENNOSIDES-DOCUSATE SODIUM 8.6-50 MG PO TABS
2.0000 | ORAL_TABLET | Freq: Two times a day (BID) | ORAL | Status: DC
  Administered 2020-04-20 – 2020-04-21 (×2): 2 via ORAL
  Filled 2020-04-20 (×2): qty 2

## 2020-04-20 MED ORDER — THIAMINE HCL 100 MG/ML IJ SOLN: 100.0000 mg | Freq: Every day | INTRAMUSCULAR | Status: DC

## 2020-04-20 MED ORDER — MORPHINE SULFATE (PF) 4 MG/ML IV SOLN
4.0000 mg | Freq: Once | INTRAVENOUS | Status: DC
  Filled 2020-04-20: qty 1

## 2020-04-20 MED ORDER — LORAZEPAM 2 MG/ML IJ SOLN: 1.0000 mg | INTRAMUSCULAR | Status: DC | PRN

## 2020-04-20 MED ORDER — LORAZEPAM 1 MG PO TABS
1.0000 mg | ORAL_TABLET | ORAL | Status: DC | PRN
Start: 2020-04-20 — End: 2020-04-22

## 2020-04-20 MED ORDER — THIAMINE HCL 100 MG PO TABS
100.0000 mg | ORAL_TABLET | Freq: Every day | ORAL | Status: DC
  Administered 2020-04-21 – 2020-04-22 (×2): 100 mg via ORAL
  Filled 2020-04-20 (×2): qty 1

## 2020-04-20 MED ORDER — FUROSEMIDE 10 MG/ML IJ SOLN
80.0000 mg | Freq: Two times a day (BID) | INTRAMUSCULAR | Status: DC
  Administered 2020-04-21: 80 mg via INTRAVENOUS
  Filled 2020-04-20: qty 8

## 2020-04-20 MED ORDER — IOHEXOL 9 MG/ML PO SOLN
500.0000 mL | ORAL | Status: AC
  Administered 2020-04-20 (×2): 500 mL via ORAL

## 2020-04-20 MED ORDER — SODIUM BICARBONATE 650 MG PO TABS
650.0000 mg | ORAL_TABLET | Freq: Two times a day (BID) | ORAL | Status: DC
  Administered 2020-04-20 – 2020-04-22 (×4): 650 mg via ORAL
  Filled 2020-04-20 (×4): qty 1

## 2020-04-20 MED ORDER — HEPARIN SODIUM (PORCINE) 5000 UNIT/ML IJ SOLN
5000.0000 [IU] | Freq: Three times a day (TID) | INTRAMUSCULAR | Status: DC
  Administered 2020-04-20 – 2020-04-22 (×5): 5000 [IU] via SUBCUTANEOUS
  Filled 2020-04-20 (×5): qty 1

## 2020-04-20 MED ORDER — ONDANSETRON HCL 4 MG PO TABS: 4.0000 mg | ORAL_TABLET | Freq: Four times a day (QID) | ORAL | Status: DC | PRN

## 2020-04-20 MED ORDER — AMLODIPINE BESYLATE 10 MG PO TABS
10.0000 mg | ORAL_TABLET | Freq: Every day | ORAL | Status: DC
  Administered 2020-04-21 – 2020-04-22 (×2): 10 mg via ORAL
  Filled 2020-04-20 (×2): qty 1

## 2020-04-20 MED ORDER — ACETAMINOPHEN 650 MG RE SUPP: 325.0000 mg | Freq: Four times a day (QID) | RECTAL | Status: DC | PRN

## 2020-04-20 MED ORDER — FUROSEMIDE 10 MG/ML IJ SOLN
60.0000 mg | Freq: Once | INTRAMUSCULAR | Status: AC
  Administered 2020-04-20: 60 mg via INTRAVENOUS
  Filled 2020-04-20: qty 8

## 2020-04-20 MED ORDER — ONDANSETRON HCL 4 MG/2ML IJ SOLN: 4.0000 mg | Freq: Four times a day (QID) | INTRAMUSCULAR | Status: DC | PRN

## 2020-04-20 NOTE — ED Provider Notes (Signed)
[facility] DEPT Provider Note   CSN: [id number] Arrival date & time: 04/20/20  1541     History Chief Complaint  Patient presents with  . Urinary Retention    Jeffrey Payne is a 86 y.o. male.  HPI      86 year old male with hypertension, hypertriglyceridemia, Lundstrom's macroglobulinemia, CKD with AV fistula placement in November, presents with concern for abdominal pain and urinary retention.  Reports that for the last 2 days he has had difficulty urinating.  Last time he urinated was just prior to coming to the emergency department, but reports that he has been unable to feel that he is fully able to empty his bladder for the last 2 days.  Reports he had had decreased urine output, and his kidney doctor had significantly increased his Lasix.  He now reports he has the urge to urinate but does not able to empty his bladder.  In addition to this, he also reports he has been unable to have a bowel movement for the past 2 days, and has had significant worsening abdominal distention.  Denies nausea or vomiting.  Does report he has been unable to eat much today due to discomfort.  Reports he is passing flatus.  Denies fevers, chills, chest pain.  Denies having hematuria or dysuria prior to development of retentive symptoms.  On further history does acknowledge "fatigue" or shortness of breath on exertion since Monday of last week.  Reports having to stop as he is walking into his outpatient doctor offices and use wheelchair given significant dyspnea.   Past Medical History:  Diagnosis Date  . Arthritis    knees  . Cancer [facility])    prostate  . CKD (chronic kidney disease) stage 3, GFR 30-59 ml/min (HCC)   . ED (erectile dysfunction)   . GERD (gastroesophageal reflux disease)   . Hard of hearing   . Heart murmur   . Hypertension   . Hypertriglyceridemia   . Rosacea     Patient Active Problem List   Diagnosis Date Noted  . AKI (acute kidney injury)  ([place]) 04/20/2020  . Acute kidney injury superimposed on CKD ([place]) 10/06/2019  . CKD (chronic kidney disease), stage IV ([place]) 10/05/2019  . 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[place]) 10/05/2019  . Bradycardia 10/05/2019  . Hypotension 10/05/2019  . Fatigue 10/05/2019  . Hypertension   . Heart murmur     Past Surgical History:  Procedure Laterality Date  . AV FISTULA PLACEMENT Left 03/04/2020   Procedure: BRACHIOCEPHALIC ARTERIOVENOUS (AV) FISTULA CREATION LEFT;  Surgeon: Elam Dutch, MD;  Location: [facility] OR;  Service: Vascular;  Laterality: Left;  . COLONOSCOPY    . renal cyst biopsy    . TONSILLECTOMY         Family History  Problem Relation Age of Onset  . Cancer Mother     Social History   Tobacco Use  . Smoking status: Never Smoker  . Smokeless tobacco: Never Used  Vaping Use  . Vaping Use: Never used  Substance Use Topics  . Alcohol use: Yes    Comment: 2 cocktails per day  . Drug use: No    Home Medications Prior to Admission medications   Medication Sig Start Date End Date Taking? Authorizing Provider  acetaminophen (TYLENOL) 650 MG CR tablet Take 650 mg by mouth daily.   Yes [provider]  amLODipine (NORVASC) 10 MG tablet Take 10 mg by mouth daily.   Yes [provider]  calcitRIOL (ROCALTROL) 0.25 MCG  capsule Take 0.25 mcg by mouth daily.  08/20/19  Yes [provider]  cetirizine (ZYRTEC) 10 MG tablet Take 10 mg by mouth daily.   Yes [provider]  fluticasone (FLONASE) 50 MCG/ACT nasal spray Place 1 spray into the nose daily as needed for allergies.   Yes [provider]  furosemide (LASIX) 20 MG tablet Take 3 tablets (60 mg total) by mouth daily. Patient taking differently: Take 60 mg by mouth 2 (two) times daily. 10/09/19  Yes Patrecia Pour, MD  Homeopathic Products (ARNICARE ARNICA) CREA Apply 1 application topically daily as needed (arthritis pain).   Yes [provider]  hydrALAZINE (APRESOLINE) 25  MG tablet Take 25 mg by mouth 3 (three) times daily.  08/15/19  Yes [provider]  metoprolol succinate (TOPROL-XL) 50 MG 24 hr tablet Take 50 mg by mouth at bedtime.  05/27/14  Yes [provider]  tamsulosin (FLOMAX) 0.4 MG CAPS capsule Take 1 capsule (0.4 mg total) by mouth daily after supper. Patient taking differently: Take 0.4 mg by mouth at bedtime. 10/09/19  Yes Patrecia Pour, MD  HYDROcodone-acetaminophen (NORCO) 5-325 MG tablet Take 1 tablet by mouth every 6 (six) hours as needed for moderate pain. Patient not taking: No sig reported 03/04/20   Dagoberto Ligas, PA-C    Allergies    Patient has no known allergies.  Review of Systems   Review of Systems  Constitutional: Positive for fatigue. Negative for fever.  Eyes: Negative for visual disturbance.  Respiratory: Positive for shortness of breath. Negative for cough.   Cardiovascular: Positive for leg swelling. Negative for chest pain.  Gastrointestinal: Positive for abdominal pain and constipation. Negative for diarrhea, nausea and vomiting.  Genitourinary: Positive for difficulty urinating. Negative for frequency and hematuria.  Skin: Negative for rash.  Neurological: Negative for syncope and headaches.    Physical Exam Updated Vital Signs BP (!) 116/44   Pulse (!) 54   Temp 98.3 F (36.8 C)   Resp 19   Wt 63 kg   SpO2 92%   BMI 22.42 kg/m   Physical Exam Vitals and nursing note reviewed.  Constitutional:      General: He is not in acute distress.    Appearance: He is well-developed and well-nourished. He is not diaphoretic.  HENT:     Head: Normocephalic and atraumatic.  Eyes:     Extraocular Movements: EOM normal.     Conjunctiva/sclera: Conjunctivae normal.  Cardiovascular:     Rate and Rhythm: Normal rate and regular rhythm.     Pulses: Normal pulses and intact distal pulses.  Pulmonary:     Effort: Pulmonary effort is normal. No respiratory distress.  Abdominal:     General: There is  distension.     Palpations: Abdomen is soft.     Tenderness: There is abdominal tenderness (diffuse). There is no guarding.  Musculoskeletal:     Cervical back: Normal range of motion.     Right lower leg: Edema (reports appears improved bilat) present.     Left lower leg: Edema present.  Skin:    General: Skin is warm and dry.  Neurological:     Mental Status: He is alert and oriented to person, place, and time.     ED Results / Procedures / Treatments   Labs (all labs ordered are listed, but only abnormal results are displayed) Labs Reviewed  CBC WITH DIFFERENTIAL/PLATELET - Abnormal; Notable for the following components:      Result Value  WBC 19.7 (*)    RBC 3.58 (*)    Hemoglobin 11.4 (*)    HCT 35.1 (*)    Neutro Abs 16.6 (*)    Monocytes Absolute 1.8 (*)    Abs Immature Granulocytes 0.08 (*)    All other components within normal limits  COMPREHENSIVE METABOLIC PANEL - Abnormal; Notable for the following components:   Sodium 133 (*)    CO2 19 (*)    Glucose, Bld 104 (*)    BUN 68 (*)    Creatinine, Ser 4.41 (*)    GFR, Estimated 12 (*)    All other components within normal limits  URINALYSIS, ROUTINE W REFLEX MICROSCOPIC - Abnormal; Notable for the following components:   Protein, ur 100 (*)    All other components within normal limits  BRAIN NATRIURETIC PEPTIDE - Abnormal; Notable for the following components:   B Natriuretic Peptide 2,566.7 (*)    All other components within normal limits  CBC - Abnormal; Notable for the following components:   WBC 15.0 (*)    RBC 3.09 (*)    Hemoglobin 9.9 (*)    HCT 30.6 (*)    All other components within normal limits  RESP PANEL BY RT-PCR (FLU A&B, COVID) ARPGX2  URINE CULTURE  CULTURE, BLOOD (ROUTINE X 2)  CULTURE, BLOOD (ROUTINE X 2)  LIPASE, BLOOD  PHOSPHORUS  MAGNESIUM  TSH  BASIC METABOLIC PANEL  CBC WITH DIFFERENTIAL/PLATELET  PHOSPHORUS    EKG EKG Interpretation  Date/Time:  Sunday April 20 2020  22:14:20 EST Ventricular Rate:  70 PR Interval:    QRS Duration: 94 QT Interval:  448 QTC Calculation: 484 R Axis:   -27 Text Interpretation: Sinus rhythm LVH with secondary repolarization abnormality Probable anterior infarct, age indeterminate Biphasic TW V3, nonspecific changes Confirmed by Gareth Morgan [phone]) on 04/20/2020 10:19:37 PM   Radiology CT ABDOMEN PELVIS WO CONTRAST  Result Date: 04/20/2020 CLINICAL DATA:  86 year old male with abdominal pain. Concern for bowel obstruction. EXAM: CT ABDOMEN AND PELVIS WITHOUT CONTRAST TECHNIQUE: Multidetector CT imaging of the abdomen and pelvis was performed following the standard protocol without IV contrast. COMPARISON:  None. FINDINGS: Evaluation of this exam is limited in the absence of intravenous contrast. Lower chest: Partially visualized small bilateral pleural effusions with associated partial compressive atelectasis of the lower lobes. Pneumonia is not excluded clinical correlation is recommended. There is coronary vascular calcification. Pericardial effusion measuring up to 2 cm in thickness. Correlation with echocardiogram recommended. No intra-abdominal free air or free fluid. Hepatobiliary: The liver is unremarkable. No intrahepatic biliary ductal dilatation. The gallbladder is unremarkable. Pancreas: Unremarkable. No pancreatic ductal dilatation or surrounding inflammatory changes. Spleen: Normal in size without focal abnormality. Adrenals/Urinary Tract: The adrenal glands unremarkable. There is no hydronephrosis or nephrolithiasis on either side. There is a 13 cm left renal inferior pole cyst better evaluated on the ultrasound of 10/06/2019. The visualized ureters appear unremarkable. The urinary bladder is decompressed around a Foley catheter. Stomach/Bowel: There is no bowel obstruction or active inflammation. There are scattered colonic diverticula without active inflammatory changes. The appendix is not visualized with certainty.  No inflammatory changes identified in the right lower quadrant. Vascular/Lymphatic: Advanced aortoiliac atherosclerotic disease. The IVC is unremarkable. No portal venous gas. There is no adenopathy. Reproductive: Enlarged prostate gland measuring 6 cm in transverse axial diameter. The seminal vesicles are symmetric. Other: None Musculoskeletal: Degenerative changes of the spine. No acute osseous pathology. IMPRESSION: 1. No acute intra-abdominal or pelvic pathology. No bowel obstruction.  2. Colonic diverticulosis. 3. Partially visualized small bilateral pleural effusions with associated partial compressive atelectasis of the lower lobes. Pneumonia is not excluded clinical correlation is recommended. 4. Pericardial effusion measuring up to 2 cm in thickness. Correlation with echocardiogram recommended. 5. Aortic Atherosclerosis (ICD10-I70.0). Electronically Signed   By: Anner Crete M.D.   On: 04/20/2020 20:14    Procedures Procedures (including critical care time)  Medications Ordered in ED Medications  morphine 4 MG/ML injection 4 mg (4 mg Intravenous Not Given 04/20/20 1820)  amLODipine (NORVASC) tablet 10 mg (has no administration in time range)  metoprolol succinate (TOPROL-XL) 24 hr tablet 50 mg (50 mg Oral Given 04/20/20 2257)  heparin injection 5,000 Units (5,000 Units Subcutaneous Given 04/20/20 2257)  acetaminophen (TYLENOL) tablet 325 mg (has no administration in time range)    Or  acetaminophen (TYLENOL) suppository 325 mg (has no administration in time range)  ondansetron (ZOFRAN) tablet 4 mg (has no administration in time range)    Or  ondansetron (ZOFRAN) injection 4 mg (has no administration in time range)  polyethylene glycol (MIRALAX / GLYCOLAX) packet 17 g (17 g Oral Given 04/20/20 2257)  senna-docusate (Senokot-S) tablet 2 tablet (2 tablets Oral Given 04/20/20 2257)  furosemide (LASIX) injection 80 mg (has no administration in time range)  sodium bicarbonate tablet 650 mg  (650 mg Oral Given 04/20/20 2343)  calcitRIOL (ROCALTROL) capsule 0.25 mcg (has no administration in time range)  LORazepam (ATIVAN) tablet 1-4 mg (has no administration in time range)    Or  LORazepam (ATIVAN) injection 1-4 mg (has no administration in time range)  thiamine tablet 100 mg (has no administration in time range)    Or  thiamine (B-1) injection 100 mg (has no administration in time range)  folic acid (FOLVITE) tablet 1 mg (has no administration in time range)  multivitamin with minerals tablet 1 tablet (has no administration in time range)  iohexol (OMNIPAQUE) 9 MG/ML oral solution 500 mL (500 mLs Oral Contrast Given 04/20/20 1845)  furosemide (LASIX) injection 60 mg (60 mg Intravenous Given 04/20/20 2144)    ED Course  I have reviewed the triage vital signs and the nursing notes.  Pertinent labs & imaging results that were available during my care of the patient were reviewed by me and considered in my medical decision making (see chart for details).    MDM Rules/Calculators/A&P                          86 year old male with hypertension, hypertriglyceridemia, Lundstrom's macroglobulinemia, CKD with AV fistula placement in November, presents with concern for abdominal pain and urinary retention.  DDx includes urinary retention (secondary to prostate enlargement or other), or abdominal pain also secondary to bowel obstruction, diverticulitis, AAA, UTI, nephrolithiasis.   Ordered foley catheter on arrival.  CT ordered to evaluate for obstruction in setting of abdominal distention, constipation, pain shows no SBO or obstruction. Does show pericardial effusion and pleural effusions. On further hx patient acknowledges 6 days of dyspnea on exertion initially described as fatigue.  Denies cough, fever, and while pt has leukocytosis I doubt pneumonia by history.  No hypotension to suggest tamponade.  Discussed with Dr. Candiss Norse of Nephrology, will admit for further diuresis in setting  of acute on chronic kidney disease now with GFR of 12.  Discussed with Cardiology who recommends ECHO in AM and reconsult. Willa dmit to hospitalist for further care.  Final Clinical Impression(s) / ED Diagnoses Final diagnoses:  Urinary retention  Pericardial effusion  Pleural effusion  Acute renal failure superimposed on stage 5 chronic kidney disease, not on chronic dialysis, unspecified acute renal failure type [facility])    Rx / DC Orders ED Discharge Orders    None       Gareth Morgan, MD 04/21/20 0128

## 2020-04-20 NOTE — ED Notes (Signed)
Wife in room and aware of admission. Wife left number [phone] to call with updates. Wife is going home at this time

## 2020-04-20 NOTE — H&P (Signed)
History and Physical   Jeffrey Payne JEH:[id number] DOB: 11-07-1932 DOA: 04/20/2020  PCP: Kristen Loader, FNP  Outpatient Specialists: Dr. Larose Hires, neprhologist  Patient coming from: home  I have personally briefly reviewed patient's old medical records in [place].  Chief Concern: abdominal pain and urinary retention  HPI: Jeffrey Payne is a 86 y.o. male with medical history significant for CKD stage IV status post left brachiocephalic AV fistula procedure on 03/04/2020 by vascular surgery, Dr. Oneida Alar, hypertension, history of first-degree heart block presented to the emergency department for chief concerns of abdominal pain and urinary retention.  He endorses a dull, suprapubic pain, that started about two days ago, described as persistent, and worse with standing, 5/10 at it's peak and currently currently a 0/10 after foley catheter was inserted.  Note: Patient's clinic nephrologist changed his Lasix to 60 mg (3 tablets) p.o. twice daily, he took this for two days only and then he stopped taking the 60 mg BID due to developing abdominal discomfort and instead only took 20 mg BID.   He denies shortness of breath and chest pain. He endorses dyspnea on exertion x3 days.  Fluid intake daily: He drinks 1 cup of coffee every morning, two glasses of water per day, 1 cup of orange juice, and two glasses of Jim Bean per day.   Social History: lives at home with spouse. He denies smoking tobacco products. He endorses etoh consumption via Haynes Hoehn, two glasses per day.   ROS: Constitutional: no weight change, no fever ENT/Mouth: no sore throat, no rhinorrhea, dysphagia, odynophagia, abnormal taste in mouth Eyes: no eye pain, no vision changes Cardiovascular: no chest pain, no dyspnea,  + edema, no palpitations, endorses dyspnea with exertion Respiratory: no cough, no sputum, no wheezing Gastrointestinal: no nausea, no vomiting, no diarrhea, + constipation Genitourinary: endorses  urinary retention x 2, no dysuria, no hematuria Musculoskeletal: no arthralgias, no myalgias Skin: no skin lesions, no pruritus, Neuro: + weakness, no loss of consciousness, no syncope Psych: no anxiety, no depression, + decrease appetite Heme/Lymph: no bruising, no bleeding  ED Course: Discussed with ED provider, patient requiring hospitalization for urinary retention and worsening kidney function.  ED provider called and discussed patient case with nephrology and they will see her. Nephrology recommends Lasix 60 mg IV twice daily, avoid nephrotoxic agents.  Assessment/Plan  Active Problems:   AKI (acute kidney injury) ([place])   AKI on CKD 5 secondary to medication noncompliance and developing post renal injury due to urinary retention -Baseline serum creatinine is 2.8-3.4 -Nephrology has been consulted and recommends to avoid nephrotoxic agents, recommended increase furosemide to 80 mg IV twice daily, and does not recommend renal replacement therapy -Monitor daily I's and O's, daily weight, maintain MAP greater than 65 - Low clinical threshold to start dialysis -Strict I's and O's -Continue Foley catheter -Low clinical threshold to transfer to [facility] for hemodialysis  Pericardial effusion- -EKG was negative for electrical alternans, sinus rhythm, rate of 70, QTc 484 -At bedside patient's is hemodynamically stable -Echo ordered -Furosemide 80 mg twice daily -Low clinical threshold to transfer to [facility] for hemodialysis  Metabolic acidosis secondary to acute kidney injury-Per nephrology, resumed bicarb 650 mg p.o. twice daily -BMP in the a.m.  Leukocytosis-patient is afebrile, denies fever, cough -Suspect secondary to urinary retention and constipation -CBC in the a.m. -Urine cultures ordered -Blood cultures x2 ordered -At this point I do not suspect an infectious etiology at this time, therefore I have not started antibiotics  Secondary hyperparathyroid -Calcitriol  0.25 mcg resumed, check daily Phos levels  Constipation versus obstipation-suspect secondary to urinary retention -GlycoLax twice daily -Senna docusate 2 tablets twice daily  Chart reviewed.   DVT prophylaxis: heparin Code Status: Full code Diet: cardiac and renal with fluid restriction of 1200 ml per day  Family Communication: spouse is aware Disposition Plan: pending clinical course Consults called: nephrology Admission status: observation with telemetry  Past Medical History:  Diagnosis Date  . Arthritis    knees  . Cancer [facility])    prostate  . CKD (chronic kidney disease) stage 3, GFR 30-59 ml/min (HCC)   . ED (erectile dysfunction)   . GERD (gastroesophageal reflux disease)   . Hard of hearing   . Heart murmur   . Hypertension   . Hypertriglyceridemia   . Rosacea    Past Surgical History:  Procedure Laterality Date  . AV FISTULA PLACEMENT Left 03/04/2020   Procedure: BRACHIOCEPHALIC ARTERIOVENOUS (AV) FISTULA CREATION LEFT;  Surgeon: Elam Dutch, MD;  Location: [facility] OR;  Service: Vascular;  Laterality: Left;  . COLONOSCOPY    . renal cyst biopsy    . TONSILLECTOMY     Social History:  reports that he has never smoked. He has never used smokeless tobacco. He reports current alcohol use. He reports that he does not use drugs.  No Known Allergies Family History  Problem Relation Age of Onset  . Cancer Mother    Family history: Family history reviewed and not pertinent  Prior to Admission medications   Medication Sig Start Date End Date Taking? Authorizing Provider  acetaminophen (TYLENOL) 650 MG CR tablet Take 650 mg by mouth daily.   Yes [provider]  amLODipine (NORVASC) 10 MG tablet Take 10 mg by mouth daily.   Yes [provider]  calcitRIOL (ROCALTROL) 0.25 MCG capsule Take 0.25 mcg by mouth daily.  08/20/19  Yes [provider]  cetirizine (ZYRTEC) 10 MG tablet Take 10 mg by mouth daily.   Yes [provider]   fluticasone (FLONASE) 50 MCG/ACT nasal spray Place 1 spray into the nose daily as needed for allergies.   Yes [provider]  furosemide (LASIX) 20 MG tablet Take 3 tablets (60 mg total) by mouth daily. Patient taking differently: Take 60 mg by mouth 2 (two) times daily. 10/09/19  Yes Patrecia Pour, MD  Homeopathic Products (ARNICARE ARNICA) CREA Apply 1 application topically daily as needed (arthritis pain).   Yes [provider]  hydrALAZINE (APRESOLINE) 25 MG tablet Take 25 mg by mouth 3 (three) times daily.  08/15/19  Yes [provider]  metoprolol succinate (TOPROL-XL) 50 MG 24 hr tablet Take 50 mg by mouth at bedtime.  05/27/14  Yes [provider]  tamsulosin (FLOMAX) 0.4 MG CAPS capsule Take 1 capsule (0.4 mg total) by mouth daily after supper. Patient taking differently: Take 0.4 mg by mouth at bedtime. 10/09/19  Yes Patrecia Pour, MD  HYDROcodone-acetaminophen (NORCO) 5-325 MG tablet Take 1 tablet by mouth every 6 (six) hours as needed for moderate pain. Patient not taking: No sig reported 03/04/20   Dagoberto Ligas, PA-C   Physical Exam: Vitals:   04/20/20 2030 04/20/20 2100 04/20/20 2130 04/20/20 2200  BP: (!) 137/49 (!) 139/58 (!) 141/53 (!) 132/50  Pulse: 66 73 72 68  Resp: 18 18 20 18   Temp:      TempSrc:      SpO2: 94% 94% 93% 93%  Weight:  Constitutional: appears age-appropriate, NAD, calm, comfortable Eyes: PERRL, lids and conjunctivae normal ENMT: Mucous membranes are moist. Posterior pharynx clear of any exudate or lesions. Dentures in place. Hearing appropriate Neck: normal, supple, no masses, no thyromegaly Respiratory: clear to auscultation bilaterally, no wheezing, no crackles. Normal respiratory effort. No accessory muscle use.  Cardiovascular: Regular rate and rhythm, no murmurs / rubs / gallops. 1+ bilateral pitting edema lower extremity. 2+ pedal pulses. No carotid bruits. Left AVF with appropriate bruit Abdomen: obese  abdomen, no tenderness, no masses palpated, no hepatosplenomegaly. Bowel sounds positive.  Musculoskeletal: no clubbing / cyanosis. No joint deformity upper and lower extremities. Good ROM, no contractures, no atrophy. Normal muscle tone.  Skin: no rashes, lesions, ulcers. No induration Neurologic: Sensation intact. Strength 5/5 in all 4.  Psychiatric: Normal judgment and insight. Alert and oriented x 3. Normal mood.   EKG: independently reviewed, showing sinus rhythm with a rate of 70, QTc 484, negative for electrical alternans  Imaging on Admission: I personally reviewed and I agree with radiologist reading as below.  CT ABDOMEN PELVIS WO CONTRAST  Result Date: 04/20/2020 CLINICAL DATA:  86 year old male with abdominal pain. Concern for bowel obstruction. EXAM: CT ABDOMEN AND PELVIS WITHOUT CONTRAST TECHNIQUE: Multidetector CT imaging of the abdomen and pelvis was performed following the standard protocol without IV contrast. COMPARISON:  None. FINDINGS: Evaluation of this exam is limited in the absence of intravenous contrast. Lower chest: Partially visualized small bilateral pleural effusions with associated partial compressive atelectasis of the lower lobes. Pneumonia is not excluded clinical correlation is recommended. There is coronary vascular calcification. Pericardial effusion measuring up to 2 cm in thickness. Correlation with echocardiogram recommended. No intra-abdominal free air or free fluid. Hepatobiliary: The liver is unremarkable. No intrahepatic biliary ductal dilatation. The gallbladder is unremarkable. Pancreas: Unremarkable. No pancreatic ductal dilatation or surrounding inflammatory changes. Spleen: Normal in size without focal abnormality. Adrenals/Urinary Tract: The adrenal glands unremarkable. There is no hydronephrosis or nephrolithiasis on either side. There is a 13 cm left renal inferior pole cyst better evaluated on the ultrasound of 10/06/2019. The visualized ureters  appear unremarkable. The urinary bladder is decompressed around a Foley catheter. Stomach/Bowel: There is no bowel obstruction or active inflammation. There are scattered colonic diverticula without active inflammatory changes. The appendix is not visualized with certainty. No inflammatory changes identified in the right lower quadrant. Vascular/Lymphatic: Advanced aortoiliac atherosclerotic disease. The IVC is unremarkable. No portal venous gas. There is no adenopathy. Reproductive: Enlarged prostate gland measuring 6 cm in transverse axial diameter. The seminal vesicles are symmetric. Other: None Musculoskeletal: Degenerative changes of the spine. No acute osseous pathology. IMPRESSION: 1. No acute intra-abdominal or pelvic pathology. No bowel obstruction. 2. Colonic diverticulosis. 3. Partially visualized small bilateral pleural effusions with associated partial compressive atelectasis of the lower lobes. Pneumonia is not excluded clinical correlation is recommended. 4. Pericardial effusion measuring up to 2 cm in thickness. Correlation with echocardiogram recommended. 5. Aortic Atherosclerosis (ICD10-I70.0). Electronically Signed   By: Anner Crete M.D.   On: 04/20/2020 20:14   Labs on Admission: I have personally reviewed following labs  CBC: Recent Labs  Lab 04/20/20 1819  WBC 19.7*  NEUTROABS 16.6*  HGB 11.4*  HCT 35.1*  MCV 98.0  PLT 790   Basic Metabolic Panel: Recent Labs  Lab 04/20/20 1819  NA 133*  K 4.8  CL 100  CO2 19*  GLUCOSE 104*  BUN 68*  CREATININE 4.41*  CALCIUM 10.0   GFR: Estimated Creatinine Clearance: 10.9  mL/min (A) (by C-G formula based on SCr of 4.41 mg/dL (H)). Liver Function Tests: Recent Labs  Lab 04/20/20 1819  AST 19  ALT 13  ALKPHOS 88  BILITOT 0.8  PROT 7.0  ALBUMIN 3.6   Recent Labs  Lab 04/20/20 1819  LIPASE 32   Urine analysis:    Component Value Date/Time   COLORURINE YELLOW 04/20/2020 1819   APPEARANCEUR CLEAR 04/20/2020  1819   LABSPEC 1.011 04/20/2020 1819   PHURINE 5.0 04/20/2020 1819   GLUCOSEU NEGATIVE 04/20/2020 1819   HGBUR NEGATIVE 04/20/2020 1819   BILIRUBINUR NEGATIVE 04/20/2020 1819   KETONESUR NEGATIVE 04/20/2020 1819   PROTEINUR 100 (A) 04/20/2020 1819   NITRITE NEGATIVE 04/20/2020 1819   LEUKOCYTESUR NEGATIVE 04/20/2020 1819   Daryle Amis N Josua Ferrebee D.O. [facility]  If 7PM-7AM, please contact overnight-coverage provider If 7AM-7PM, please contact day coverage provider [email or web address]  04/20/2020, 11:22 PM

## 2020-04-20 NOTE — ED Triage Notes (Addendum)
Patient reports urinary retention x2 days. He states he has had a catheter placed in the past. Hx of CKD and HTN. Denies any other symptoms.

## 2020-04-20 NOTE — Consult Note (Signed)
Nephrology Consult  [facility]  Requesting provider: Gareth Morgan, MD  Assessment/Recommendations:  AKI on CKD4  -AKI likely secondary to acute urinary retention, questionable if there is a cardiorenal component. 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 If dialysis is foreseeable during this admission, then would recommend having a low threshold to transfer to [facility] Would need to start via tunneled dialysis catheter as his access has not yet matured -Continue to monitor daily Cr, Dose meds for GFR<15 -Monitor Daily I/Os, Daily weight  -Maintain MAP>65 for optimal renal perfusion.  -Agree with holding ACE-I, avoid further nephrotoxins including NSAIDS, Morphine.  Unless absolutely necessary, avoid CT with contrast and/or MRI with gadolinium.     Shortness of breath, bilateral pleural effusion -recommend lasix 80mg  IV BID to start with, has already had a good response to 60mg  IV. Recommend CXR or dedicated CT chest without contrast to further evaluate this  Pericardial effusion -on CT abd/pelv. Recommend dedicated echo. Diuretics as above  Urinary retention, h/o BPH -has required a foley in the past. Foley placed in ER. Recommend outpatient GU follow up -resume flomax  Metabolic acidosis -if no improvement then please start nahco3 650mg  BID  Hypertension: -resume home anti-hypertensives  Secondary Hyperparathyroidism -resume calcitriol -monitor phos  Anemia due to chronic kidney disease -Hgb currently at goal -Transfuse for Hgb<7 g/dL  Access: -LUE BC AVF by Dr. Oneida Alar on 03/04/2020. Has been slow to  mature, targeting for Feb for it to probably be ready to use   Recommendations conveyed to primary service.    [facility] 04/20/2020 9:28 PM   _____________________________________________________________________________________   History of Present Illness: Jeffrey Payne is a 86 y.o. male with a past medical history of CKD4, MGUS (IgM kappa), HTN, HLD who presents to [facility] with urinary retention x 2 days associated with abdominal pain. Foley placed in ER (has had a similar issue in the past). Was recently seen by Dr. Marval Regal on 12/23. Lasix was increased to 60mg  BID in the setting of hypervolemia/SOB at that time. There were plans on obtaining a CXR to start with.  Of note, patient had reported decreased urinary frequency and abdominal pain.  He thought that abdominal pain was secondary to increased Lasix dosing therefore had reduced his dose to 20 mg twice a day. In the ER: foley placed. CT abd/pelv without contrast obtained: No acute intra-abdominal or pelvic pathologies.  Partially visualized small bilateral pleural effusions with associated partial compressive atelectasis of the lower lobes pneumonia is not excluded.  Pericardial effusion measuring up to 2 cm in thickness also found on CT. Sats have been between 93-95% on RA.  Received Lasix 60 mg IV x1 dose and has already put out 600 cc of urine.  Unsure of how much urine came out after Foley placement. Currently, patient reports that his abdominal pain has resolved after Foley placement. Denies chest pain, abdominal pain, dysgeusia, nausea/vomiting, loss of appetite, tremors/jerks.  Medications:  Current Facility-Administered Medications  Medication Dose Route Frequency Provider Last Rate Last Admin  . morphine 4 MG/ML injection 4 mg  4 mg Intravenous Once Gareth Morgan, MD       Current Outpatient Medications  Medication Sig Dispense Refill  . acetaminophen (TYLENOL)  650 MG CR tablet Take 650 mg  by mouth daily.    Marland Kitchen amLODipine (NORVASC) 10 MG tablet Take 10 mg by mouth daily.    . calcitRIOL (ROCALTROL) 0.25 MCG capsule Take 0.25 mcg by mouth daily.     . cetirizine (ZYRTEC) 10 MG tablet Take 10 mg by mouth daily.    . fluticasone (FLONASE) 50 MCG/ACT nasal spray Place 1 spray into the nose daily as needed for allergies.    . furosemide (LASIX) 20 MG tablet Take 3 tablets (60 mg total) by mouth daily. (Patient taking differently: Take 60 mg by mouth 2 (two) times daily.) 90 tablet 0  . Homeopathic Products (ARNICARE ARNICA) CREA Apply 1 application topically daily as needed (arthritis pain).    . hydrALAZINE (APRESOLINE) 25 MG tablet Take 25 mg by mouth 3 (three) times daily.     . metoprolol succinate (TOPROL-XL) 50 MG 24 hr tablet Take 50 mg by mouth at bedtime.   1  . tamsulosin (FLOMAX) 0.4 MG CAPS capsule Take 1 capsule (0.4 mg total) by mouth daily after supper. (Patient taking differently: Take 0.4 mg by mouth at bedtime.) 30 capsule 0  . HYDROcodone-acetaminophen (NORCO) 5-325 MG tablet Take 1 tablet by mouth every 6 (six) hours as needed for moderate pain. (Patient not taking: No sig reported) 10 tablet 0     ALLERGIES Patient has no known allergies.  MEDICAL HISTORY Past Medical History:  Diagnosis Date  . Arthritis    knees  . Cancer [facility])    prostate  . CKD (chronic kidney disease) stage 3, GFR 30-59 ml/min (HCC)   . ED (erectile dysfunction)   . GERD (gastroesophageal reflux disease)   . Hard of hearing   . Heart murmur   . Hypertension   . Hypertriglyceridemia   . Rosacea      SOCIAL HISTORY Social History   Socioeconomic History  . Marital status: Married    Spouse name: Not on file  . Number of children: Not on file  . Years of education: Not on file  . Highest education level: Not on file  Occupational History  . Not on file  Tobacco Use  . Smoking status: Never Smoker  . Smokeless tobacco: Never Used  Vaping Use  . Vaping Use: Never used   Substance and Sexual Activity  . Alcohol use: Yes    Comment: 2 cocktails per day  . Drug use: No  . Sexual activity: Not on file  Other Topics Concern  . Not on file  Social History Narrative  . Not on file   Social Determinants of Health   Financial Resource Strain: Not on file  Food Insecurity: Not on file  Transportation Needs: Not on file  Physical Activity: Not on file  Stress: Not on file  Social Connections: Not on file  Intimate Partner Violence: Not on file     FAMILY HISTORY Family History  Problem Relation Age of Onset  . Cancer Mother      Review of Systems: 81 systems reviewed Otherwise as per HPI, all other systems reviewed and negative  Physical Exam: Vitals:   04/20/20 2030 04/20/20 2100  BP: (!) 137/49 (!) 139/58  Pulse: 66 73  Resp: 18 18  Temp:    SpO2: 94% 94%   Total I/O In: -  Out: 1000 [Urine:1000]  Intake/Output Summary (Last 24 hours) at 04/20/2020 2128 Last data filed at 04/20/2020 1931 Gross per 24 hour  Intake --  Output 2200 ml  Net -2200 ml   General: well-appearing, no acute distress HEENT: anicteric sclera, oropharynx clear without lesions CV: Muffled heart sounds, regular rate, normal rhythm, no murmurs, no gallops, no rubs Lungs: Fine crackles with diminished air entry bibasilar, no w/r/r, mostly finishing complete sentences, bilateral chest expansion Abd: soft, non-tender, non-distended Skin: no visible lesions or rashes Psych: alert, engaged, appropriate mood and affect Musculoskeletal: 2+ pitting edema bilateral lower extremities, LUE AVF +b/t Neuro: normal speech, no gross focal deficits, no asterixis  Test Results Reviewed Lab Results  Component Value Date   NA 133 (L) 04/20/2020   K 4.8 04/20/2020   CL 100 04/20/2020   CO2 19 (L) 04/20/2020   BUN 68 (H) 04/20/2020   CREATININE 4.41 (H) 04/20/2020   CALCIUM 10.0 04/20/2020   ALBUMIN 3.6 04/20/2020   PHOS 3.6 10/07/2019     I have reviewed all  relevant outside healthcare records related to the patient's kidney injury.

## 2020-04-21 ENCOUNTER — Observation Stay (HOSPITAL_COMMUNITY): Payer: Medicare Other

## 2020-04-21 DIAGNOSIS — I770 Arteriovenous fistula, acquired: Secondary | ICD-10-CM | POA: Diagnosis present

## 2020-04-21 DIAGNOSIS — E8770 Fluid overload, unspecified: Secondary | ICD-10-CM | POA: Diagnosis present

## 2020-04-21 DIAGNOSIS — Z79899 Other long term (current) drug therapy: Secondary | ICD-10-CM | POA: Diagnosis not present

## 2020-04-21 DIAGNOSIS — J90 Pleural effusion, not elsewhere classified: Secondary | ICD-10-CM | POA: Diagnosis present

## 2020-04-21 DIAGNOSIS — D472 Monoclonal gammopathy: Secondary | ICD-10-CM | POA: Diagnosis present

## 2020-04-21 DIAGNOSIS — I313 Pericardial effusion (noninflammatory): Secondary | ICD-10-CM

## 2020-04-21 DIAGNOSIS — Z9114 Patient's other noncompliance with medication regimen: Secondary | ICD-10-CM | POA: Diagnosis not present

## 2020-04-21 DIAGNOSIS — N179 Acute kidney failure, unspecified: Secondary | ICD-10-CM | POA: Diagnosis present

## 2020-04-21 DIAGNOSIS — E872 Acidosis: Secondary | ICD-10-CM | POA: Diagnosis present

## 2020-04-21 DIAGNOSIS — R338 Other retention of urine: Secondary | ICD-10-CM | POA: Diagnosis present

## 2020-04-21 DIAGNOSIS — R140 Abdominal distension (gaseous): Secondary | ICD-10-CM | POA: Diagnosis present

## 2020-04-21 DIAGNOSIS — C880 Waldenstrom macroglobulinemia: Secondary | ICD-10-CM | POA: Diagnosis present

## 2020-04-21 DIAGNOSIS — I440 Atrioventricular block, first degree: Secondary | ICD-10-CM | POA: Diagnosis present

## 2020-04-21 DIAGNOSIS — K219 Gastro-esophageal reflux disease without esophagitis: Secondary | ICD-10-CM | POA: Diagnosis present

## 2020-04-21 DIAGNOSIS — N2581 Secondary hyperparathyroidism of renal origin: Secondary | ICD-10-CM | POA: Diagnosis present

## 2020-04-21 DIAGNOSIS — H9190 Unspecified hearing loss, unspecified ear: Secondary | ICD-10-CM | POA: Diagnosis present

## 2020-04-21 DIAGNOSIS — N401 Enlarged prostate with lower urinary tract symptoms: Secondary | ICD-10-CM | POA: Diagnosis present

## 2020-04-21 DIAGNOSIS — M170 Bilateral primary osteoarthritis of knee: Secondary | ICD-10-CM | POA: Diagnosis present

## 2020-04-21 DIAGNOSIS — D631 Anemia in chronic kidney disease: Secondary | ICD-10-CM | POA: Diagnosis present

## 2020-04-21 DIAGNOSIS — Z8546 Personal history of malignant neoplasm of prostate: Secondary | ICD-10-CM | POA: Diagnosis not present

## 2020-04-21 DIAGNOSIS — N138 Other obstructive and reflux uropathy: Secondary | ICD-10-CM | POA: Diagnosis present

## 2020-04-21 DIAGNOSIS — Z20822 Contact with and (suspected) exposure to covid-19: Secondary | ICD-10-CM | POA: Diagnosis present

## 2020-04-21 DIAGNOSIS — E781 Pure hyperglyceridemia: Secondary | ICD-10-CM | POA: Diagnosis present

## 2020-04-21 DIAGNOSIS — R0600 Dyspnea, unspecified: Secondary | ICD-10-CM

## 2020-04-21 DIAGNOSIS — R0609 Other forms of dyspnea: Secondary | ICD-10-CM | POA: Diagnosis present

## 2020-04-21 LAB — CBC WITH DIFFERENTIAL/PLATELET
Abs Immature Granulocytes: 0.04 10*3/uL (ref 0.00–0.07)
Basophils Absolute: 0 10*3/uL (ref 0.0–0.1)
Basophils Relative: 0 %
Eosinophils Absolute: 0.1 10*3/uL (ref 0.0–0.5)
Eosinophils Relative: 1 %
HCT: 30.4 % — ABNORMAL LOW (ref 39.0–52.0)
Hemoglobin: 9.9 g/dL — ABNORMAL LOW (ref 13.0–17.0)
Immature Granulocytes: 0 %
Lymphocytes Relative: 15 %
Lymphs Abs: 1.7 10*3/uL (ref 0.7–4.0)
MCH: 32 pg (ref 26.0–34.0)
MCHC: 32.6 g/dL (ref 30.0–36.0)
MCV: 98.4 fL (ref 80.0–100.0)
Monocytes Absolute: 1.2 10*3/uL — ABNORMAL HIGH (ref 0.1–1.0)
Monocytes Relative: 11 %
Neutro Abs: 8.5 10*3/uL — ABNORMAL HIGH (ref 1.7–7.7)
Neutrophils Relative %: 73 %
Platelets: 254 10*3/uL (ref 150–400)
RBC: 3.09 MIL/uL — ABNORMAL LOW (ref 4.22–5.81)
RDW: 14.6 % (ref 11.5–15.5)
WBC: 11.6 10*3/uL — ABNORMAL HIGH (ref 4.0–10.5)
nRBC: 0 % (ref 0.0–0.2)

## 2020-04-21 LAB — BASIC METABOLIC PANEL
Anion gap: 10 (ref 5–15)
BUN: 57 mg/dL — ABNORMAL HIGH (ref 8–23)
CO2: 21 mmol/L — ABNORMAL LOW (ref 22–32)
Calcium: 9.3 mg/dL (ref 8.9–10.3)
Chloride: 101 mmol/L (ref 98–111)
Creatinine, Ser: 4.29 mg/dL — ABNORMAL HIGH (ref 0.61–1.24)
GFR, Estimated: 13 mL/min — ABNORMAL LOW (ref >60)
Glucose, Bld: 88 mg/dL (ref 70–99)
Potassium: 4.2 mmol/L (ref 3.5–5.1)
Sodium: 132 mmol/L — ABNORMAL LOW (ref 135–145)

## 2020-04-21 LAB — PHOSPHORUS
Phosphorus: 4.3 mg/dL (ref 2.5–4.6)
Phosphorus: 4.9 mg/dL — ABNORMAL HIGH (ref 2.5–4.6)

## 2020-04-21 LAB — CBC
HCT: 30.6 % — ABNORMAL LOW (ref 39.0–52.0)
Hemoglobin: 9.9 g/dL — ABNORMAL LOW (ref 13.0–17.0)
MCH: 32 pg (ref 26.0–34.0)
MCHC: 32.4 g/dL (ref 30.0–36.0)
MCV: 99 fL (ref 80.0–100.0)
Platelets: 257 10*3/uL (ref 150–400)
RBC: 3.09 MIL/uL — ABNORMAL LOW (ref 4.22–5.81)
RDW: 14.6 % (ref 11.5–15.5)
WBC: 15 10*3/uL — ABNORMAL HIGH (ref 4.0–10.5)
nRBC: 0 % (ref 0.0–0.2)

## 2020-04-21 LAB — ECHOCARDIOGRAM COMPLETE
Area-P 1/2: 4.15 cm2
Height: 67 in
P 1/2 time: 565 msec
S' Lateral: 3.5 cm
Weight: 2194.02 oz

## 2020-04-21 LAB — URINE CULTURE: Culture: NO GROWTH

## 2020-04-21 LAB — MAGNESIUM: Magnesium: 1.8 mg/dL (ref 1.7–2.4)

## 2020-04-21 LAB — TSH: TSH: 2.312 u[IU]/mL (ref 0.350–4.500)

## 2020-04-21 MED ORDER — CHLORHEXIDINE GLUCONATE CLOTH 2 % EX PADS
6.0000 | MEDICATED_PAD | Freq: Every day | CUTANEOUS | Status: DC
  Administered 2020-04-21 – 2020-04-22 (×2): 6 via TOPICAL

## 2020-04-21 MED ORDER — FUROSEMIDE 10 MG/ML IJ SOLN
60.0000 mg | Freq: Two times a day (BID) | INTRAMUSCULAR | Status: DC
  Administered 2020-04-21: 60 mg via INTRAVENOUS
  Filled 2020-04-21 (×2): qty 6

## 2020-04-21 NOTE — Progress Notes (Signed)
Received pt from ED on a stretcher, accompanied by [facility] ER RN, pt aaox4, no s/s of respiratory distress and no complains of pain, VSS and will continue plan of care.

## 2020-04-21 NOTE — Progress Notes (Signed)
  Echocardiogram 2D Echocardiogram has been performed.  Jeffrey Payne 04/21/2020, 10:01 AM

## 2020-04-21 NOTE — ED Notes (Signed)
Called wife @ [phone] to update that patient has bed.

## 2020-04-21 NOTE — Plan of Care (Signed)
  Problem: Health Behavior/Discharge Planning: Goal: Ability to manage health-related needs will improve Outcome: Progressing   Problem: Clinical Measurements: Goal: Ability to maintain clinical measurements within normal limits will improve Outcome: Progressing   Problem: Clinical Measurements: Goal: Will remain free from infection Outcome: Progressing   Problem: Clinical Measurements: Goal: Diagnostic test results will improve Outcome: Progressing   Problem: Elimination: Goal: Will not experience complications related to bowel motility Outcome: Progressing   Problem: Pain Managment: Goal: General experience of comfort will improve Outcome: Progressing   Problem: Clinical Measurements: Goal: Complications related to the disease process, condition or treatment will be avoided or minimized Outcome: Progressing

## 2020-04-21 NOTE — Progress Notes (Signed)
[name] Progress Note  Subjective: seen in room, pt sleeping , arousable and responsive. Jeffrey Payne and 1200 today.  Creat down 4.4 > 4.2 today, BUN 57.  K+ okay. Hb 9.9    Vitals:   04/21/20 0100 04/21/20 0135 04/21/20 0559 04/21/20 1011  BP: (!) 116/44 (!) 140/48 (!) 131/54 (!) 144/51  Pulse: (!) 54 65 (!) 54 70  Resp: 19 18 20 20   Temp: 98.3 F (36.8 C) 98.2 F (36.8 C) 98.6 F (37 C) 98 F (36.7 C)  TempSrc:  Oral Oral Oral  SpO2: 92% 96% 93% 96%  Weight:  62.2 kg    Height:  5\' 7"  (1.702 m)      Exam:   Tired, elderly WM lying flat in bed, afebrile     Sleeping, awakens easily and answers simple questions  no jvd  Chest cta bilat  Cor reg no RG  Abd soft ntnd no ascites   Ext 1+ LE edema   Alert, NF, ox3    12/26  UA - negative         Assessment/ Plan: 1. AKI on CKD 4 - b/l creat 2.8- 3.4 recently. 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 Here UA is neg, CT w/o hydronephrosis, small/ atrophic kidneys. AKI due to urinary retention most likely.  No indication for RRT. Creat down today. Patient looks tired but no gross uremia/ no indication for acute RRT at this time. Will follow.  2. SOB/ pleural effusion - no pulm edema per CT. Diuresed here w/ IV lasix 60-80 mg. Will cont IV lasix 60 bid today. Edema improved.  3. Urinary retention / hx BPH - foley in place, will need urology f/u.  On flomax.  4. HTN - cont home meds as needed.  5. HD access - has L AVF pr Dr Oneida Alar 03/04/20, slow to mature, poss will be ready in February.    Jeffrey Payne 04/21/2020, 10:16 AM   Recent Labs  Lab 04/20/20 1819 04/20/20 2331 04/21/20 0328  K 4.8  --  4.2  BUN 68*  --  57*  CREATININE 4.41*  --  4.29*  CALCIUM 10.0  --  9.3  PHOS  --  4.3 4.9*  HGB 11.4* 9.9* 9.9*   Inpatient medications: . amLODipine  10 mg Oral Daily  . calcitRIOL  0.25 mcg Oral Daily  . Chlorhexidine Gluconate Cloth  6 each Topical Daily  . folic acid  1 mg  Oral Daily  . furosemide  80 mg Intravenous BID  . heparin  5,000 Units Subcutaneous Q8H  . metoprolol succinate  50 mg Oral QHS  .  morphine injection  4 mg Intravenous Once  . multivitamin with minerals  1 tablet Oral Daily  . polyethylene glycol  17 g Oral BID  . senna-docusate  2 tablet Oral BID  . sodium bicarbonate  650 mg Oral BID  . thiamine  100 mg Oral Daily   Or  . thiamine  100 mg Intravenous Daily    acetaminophen **OR** acetaminophen, LORazepam **OR** LORazepam, ondansetron **OR** ondansetron (ZOFRAN) IV

## 2020-04-21 NOTE — Progress Notes (Signed)
PROGRESS NOTE    Jeffrey Payne  DGU:[id number] DOB: [birth year] DOA: 04/20/2020 PCP: Kristen Loader, FNP   Brief Narrative:  Jeffrey Payne is a 86 y.o. male with medical history significant for CKD stage IV status post left brachiocephalic AV fistula procedure on 03/04/2020 by vascular surgery, Dr. Oneida Alar, hypertension, history of first-degree heart block presented to the emergency department for chief concerns of abdominal pain and urinary retention. He endorses a dull, suprapubic pain, that started about two days ago, described as persistent, and worse with standing, 5/10 at it's peak and currently currently a 0/10 after foley catheter was inserted. Patient's clinic nephrologist changed his Lasix to 60 mg (3 tablets) p.o. twice daily, he took this for two days only and then he stopped taking the 60 mg BID due to developing abdominal discomfort and instead only took 20 mg BID. He denies shortness of breath and chest pain. He endorses dyspnea on exertion x3 days. Fluid intake daily: He drinks 1 cup of coffee every morning, two glasses of water per day, 1 cup of orange juice, and two glasses of Jim Bean per day.    Assessment & Plan:   Active Problems:   AKI (acute kidney injury) ([place])   AKI on CKD 5 secondary to medication noncompliance and developing post renal injury due to urinary retention -Baseline serum creatinine is 2.8-3.4 -Nephrology has been consulted and recommends to avoid nephrotoxic agents, recommended increase furosemide to 80 mg IV twice daily, and does not recommend renal replacement therapy -Monitor daily I's and O's, daily weight, maintain MAP greater than 65 - Low clinical threshold to start dialysis -Strict I's and O's -Continue Foley catheter -Low clinical threshold to transfer to [facility] for hemodialysis  Pericardial effusion in the setting of above -EKG was negative for electrical alternans, sinus rhythm, rate of 70, QTc 484 -At bedside patient's is  hemodynamically stable -Echo ordered, read pending -Furosemide 80 mg twice daily as above -Low clinical threshold to transfer to [facility] for hemodialysis  Metabolic acidosis secondary to acute kidney injury-Per nephrology, resumed bicarb 650 mg p.o. twice daily -BMP in the a.m.  Leukemoid reaction given above, resolving with supportive care -Suspect leukemoid reaction secondary to urinary retention and constipation -CBC in the a.m. -Urine cultures ordered -Blood cultures x2 ordered -At this point I do not suspect an infectious etiology at this time, therefore I have not started antibiotics  Secondary hyperparathyroid -Calcitriol 0.25 mcg resumed, check daily Phos levels -minimally elevated this morning  Constipation versus obstipation-suspect secondary to urinary retention -GlycoLax twice daily -Senna docusate 2 tablets twice daily   DVT prophylaxis: heparin Code Status: Full code Family Communication: None available  Status is: Inpatient  Dispo: The patient is from: Home              Anticipated d/c is to: Home              Anticipated d/c date is: Greater than 72 hours              Patient currently not medically stable for discharge  Consultants:   Nephrology  Procedures:   None  Antimicrobials:  None  Subjective: No acute issues or events overnight denies nausea, vomiting, diarrhea, constipation, headache, fevers, chills.  Objective: Vitals:   04/20/20 2344 04/21/20 0100 04/21/20 0135 04/21/20 0559  BP:  (!) 116/44 (!) 140/48 (!) 131/54  Pulse:  (!) 54 65 (!) 54  Resp:  19 18 20   Temp: 97.9 F (36.6 C) 98.3  F (36.8 C) 98.2 F (36.8 C) 98.6 F (37 C)  TempSrc: Oral  Oral Oral  SpO2:  92% 96% 93%  Weight:   62.2 kg   Height:   5\' 7"  (1.702 m)     Intake/Output Summary (Last 24 hours) at 04/21/2020 0736 Last data filed at 04/21/2020 0600 Gross per 24 hour  Intake --  Output 3400 ml  Net -3400 ml   Filed Weights   04/20/20 1931 04/21/20  0135  Weight: 63 kg 62.2 kg    Examination:  General:  Pleasantly resting in bed, No acute distress. HEENT:  Normocephalic atraumatic.  Sclerae nonicteric, noninjected.  Extraocular movements intact bilaterally. Neck:  Without mass or deformity.  Trachea is midline. Lungs:  Clear to auscultate bilaterally without rhonchi, wheeze, or rales. Heart:  Regular rate and rhythm.  Without murmurs, rubs, or gallops. Abdomen:  Soft, nontender, nondistended.  Without guarding or rebound. Extremities: Without cyanosis, clubbing, edema, or obvious deformity. Vascular:  Dorsalis pedis and posterior tibial pulses palpable bilaterally. Skin:  Warm and dry, no erythema, no ulcerations.    Data Reviewed: I have personally reviewed following labs and imaging studies  CBC: Recent Labs  Lab 04/20/20 1819 04/20/20 2331 04/21/20 0328  WBC 19.7* 15.0* 11.6*  NEUTROABS 16.6*  --  8.5*  HGB 11.4* 9.9* 9.9*  HCT 35.1* 30.6* 30.4*  MCV 98.0 99.0 98.4  PLT 305 257 188   Basic Metabolic Panel: Recent Labs  Lab 04/20/20 1819 04/20/20 2331 04/21/20 0328  NA 133*  --  132*  K 4.8  --  4.2  CL 100  --  101  CO2 19*  --  21*  GLUCOSE 104*  --  88  BUN 68*  --  57*  CREATININE 4.41*  --  4.29*  CALCIUM 10.0  --  9.3  MG  --  1.8  --   PHOS  --  4.3 4.9*   GFR: Estimated Creatinine Clearance: 11.1 mL/min (A) (by C-G formula based on SCr of 4.29 mg/dL (H)). Liver Function Tests: Recent Labs  Lab 04/20/20 1819  AST 19  ALT 13  ALKPHOS 88  BILITOT 0.8  PROT 7.0  ALBUMIN 3.6   Recent Labs  Lab 04/20/20 1819  LIPASE 32   No results for input(s): AMMONIA in the last 168 hours. Coagulation Profile: No results for input(s): INR, PROTIME in the last 168 hours. Cardiac Enzymes: No results for input(s): CKTOTAL, CKMB, CKMBINDEX, TROPONINI in the last 168 hours. BNP (last 3 results) No results for input(s): PROBNP in the last 8760 hours. HbA1C: No results for input(s): HGBA1C in the last  72 hours. CBG: No results for input(s): GLUCAP in the last 168 hours. Lipid Profile: No results for input(s): CHOL, HDL, LDLCALC, TRIG, CHOLHDL, LDLDIRECT in the last 72 hours. Thyroid Function Tests: Recent Labs    04/21/20 0328  TSH 2.312   Anemia Panel: No results for input(s): VITAMINB12, FOLATE, FERRITIN, TIBC, IRON, RETICCTPCT in the last 72 hours. Sepsis Labs: No results for input(s): PROCALCITON, LATICACIDVEN in the last 168 hours.  Recent Results (from the past 240 hour(s))  Resp Panel by RT-PCR (Flu A&B, Covid) Nasopharyngeal Swab     Status: None   Collection Time: 04/20/20 10:51 PM   Specimen: Nasopharyngeal Swab; Nasopharyngeal(NP) swabs in vial transport medium  Result Value Ref Range Status   SARS Coronavirus 2 by RT PCR NEGATIVE NEGATIVE Final    Comment: (NOTE) SARS-CoV-2 target nucleic acids are NOT DETECTED.  The SARS-CoV-2  RNA is generally detectable in upper respiratory specimens during the acute phase of infection. The lowest concentration of SARS-CoV-2 viral copies this assay can detect is 138 copies/mL. A negative result does not preclude SARS-Cov-2 infection and should not be used as the sole basis for treatment or other patient management decisions. A negative result may occur with  improper specimen collection/handling, submission of specimen other than nasopharyngeal swab, presence of viral mutation(s) within the areas targeted by this assay, and inadequate number of viral copies(<138 copies/mL). A negative result must be combined with clinical observations, patient history, and epidemiological information. The expected result is Negative.  Fact Sheet for Patients:  EntrepreneurPulse.com.au  Fact Sheet for Healthcare Providers:  IncredibleEmployment.be  This test is no t yet approved or cleared by the [place] FDA and  has been authorized for detection and/or diagnosis of SARS-CoV-2 by FDA under an  Emergency Use Authorization (EUA). This EUA will remain  in effect (meaning this test can be used) for the duration of the COVID-19 declaration under Section 564(b)(1) of the Act, 21 U.S.C.section 360bbb-3(b)(1), unless the authorization is terminated  or revoked sooner.       Influenza A by PCR NEGATIVE NEGATIVE Final   Influenza B by PCR NEGATIVE NEGATIVE Final    Comment: (NOTE) The Xpert Xpress SARS-CoV-2/FLU/RSV plus assay is intended as an aid in the diagnosis of influenza from Nasopharyngeal swab specimens and should not be used as a sole basis for treatment. Nasal washings and aspirates are unacceptable for Xpert Xpress SARS-CoV-2/FLU/RSV testing.  Fact Sheet for Patients: EntrepreneurPulse.com.au  Fact Sheet for Healthcare Providers: IncredibleEmployment.be  This test is not yet approved or cleared by the [place] FDA and has been authorized for detection and/or diagnosis of SARS-CoV-2 by FDA under an Emergency Use Authorization (EUA). This EUA will remain in effect (meaning this test can be used) for the duration of the COVID-19 declaration under Section 564(b)(1) of the Act, 21 U.S.C. section 360bbb-3(b)(1), unless the authorization is terminated or revoked.  Performed at [facility], [place] [address] [place], [place] [address]          Radiology Studies: CT ABDOMEN PELVIS WO CONTRAST  Result Date: 04/20/2020 CLINICAL DATA:  86 year old male with abdominal pain. Concern for bowel obstruction. EXAM: CT ABDOMEN AND PELVIS WITHOUT CONTRAST TECHNIQUE: Multidetector CT imaging of the abdomen and pelvis was performed following the standard protocol without IV contrast. COMPARISON:  None. FINDINGS: Evaluation of this exam is limited in the absence of intravenous contrast. Lower chest: Partially visualized small bilateral pleural effusions with associated partial compressive atelectasis of the lower lobes.  Pneumonia is not excluded clinical correlation is recommended. There is coronary vascular calcification. Pericardial effusion measuring up to 2 cm in thickness. Correlation with echocardiogram recommended. No intra-abdominal free air or free fluid. Hepatobiliary: The liver is unremarkable. No intrahepatic biliary ductal dilatation. The gallbladder is unremarkable. Pancreas: Unremarkable. No pancreatic ductal dilatation or surrounding inflammatory changes. Spleen: Normal in size without focal abnormality. Adrenals/Urinary Tract: The adrenal glands unremarkable. There is no hydronephrosis or nephrolithiasis on either side. There is a 13 cm left renal inferior pole cyst better evaluated on the ultrasound of 10/06/2019. The visualized ureters appear unremarkable. The urinary bladder is decompressed around a Foley catheter. Stomach/Bowel: There is no bowel obstruction or active inflammation. There are scattered colonic diverticula without active inflammatory changes. The appendix is not visualized with certainty. No inflammatory changes identified in the right lower quadrant. Vascular/Lymphatic: Advanced aortoiliac atherosclerotic disease. The IVC  is unremarkable. No portal venous gas. There is no adenopathy. Reproductive: Enlarged prostate gland measuring 6 cm in transverse axial diameter. The seminal vesicles are symmetric. Other: None Musculoskeletal: Degenerative changes of the spine. No acute osseous pathology. IMPRESSION: 1. No acute intra-abdominal or pelvic pathology. No bowel obstruction. 2. Colonic diverticulosis. 3. Partially visualized small bilateral pleural effusions with associated partial compressive atelectasis of the lower lobes. Pneumonia is not excluded clinical correlation is recommended. 4. Pericardial effusion measuring up to 2 cm in thickness. Correlation with echocardiogram recommended. 5. Aortic Atherosclerosis (ICD10-I70.0). Electronically Signed   By: Anner Crete M.D.   On: 04/20/2020  20:14        Scheduled Meds:  amLODipine  10 mg Oral Daily   calcitRIOL  0.25 mcg Oral Daily   Chlorhexidine Gluconate Cloth  6 each Topical Daily   folic acid  1 mg Oral Daily   furosemide  80 mg Intravenous BID   heparin  5,000 Units Subcutaneous Q8H   metoprolol succinate  50 mg Oral QHS    morphine injection  4 mg Intravenous Once   multivitamin with minerals  1 tablet Oral Daily   polyethylene glycol  17 g Oral BID   senna-docusate  2 tablet Oral BID   sodium bicarbonate  650 mg Oral BID   thiamine  100 mg Oral Daily   Or   thiamine  100 mg Intravenous Daily   Continuous Infusions:   LOS: 0 days    Time spent: 34min    Monea Pesantez C Nawal Burling, DO [facility]  If 7PM-7AM, please contact night-coverage [email or web address]  04/21/2020, 7:36 AM

## 2020-04-22 LAB — COMPREHENSIVE METABOLIC PANEL
ALT: 17 U/L (ref 0–44)
AST: 23 U/L (ref 15–41)
Albumin: 2.8 g/dL — ABNORMAL LOW (ref 3.5–5.0)
Alkaline Phosphatase: 75 U/L (ref 38–126)
Anion gap: 11 (ref 5–15)
BUN: 71 mg/dL — ABNORMAL HIGH (ref 8–23)
CO2: 24 mmol/L (ref 22–32)
Calcium: 9.5 mg/dL (ref 8.9–10.3)
Chloride: 101 mmol/L (ref 98–111)
Creatinine, Ser: 4.03 mg/dL — ABNORMAL HIGH (ref 0.61–1.24)
GFR, Estimated: 14 mL/min — ABNORMAL LOW (ref >60)
Glucose, Bld: 91 mg/dL (ref 70–99)
Potassium: 3.5 mmol/L (ref 3.5–5.1)
Sodium: 136 mmol/L (ref 135–145)
Total Bilirubin: 0.6 mg/dL (ref 0.3–1.2)
Total Protein: 5.7 g/dL — ABNORMAL LOW (ref 6.5–8.1)

## 2020-04-22 LAB — CBC
HCT: 31 % — ABNORMAL LOW (ref 39.0–52.0)
Hemoglobin: 10.2 g/dL — ABNORMAL LOW (ref 13.0–17.0)
MCH: 32 pg (ref 26.0–34.0)
MCHC: 32.9 g/dL (ref 30.0–36.0)
MCV: 97.2 fL (ref 80.0–100.0)
Platelets: 255 10*3/uL (ref 150–400)
RBC: 3.19 MIL/uL — ABNORMAL LOW (ref 4.22–5.81)
RDW: 14.4 % (ref 11.5–15.5)
WBC: 7.6 10*3/uL (ref 4.0–10.5)
nRBC: 0 % (ref 0.0–0.2)

## 2020-04-22 LAB — PHOSPHORUS: Phosphorus: 5 mg/dL — ABNORMAL HIGH (ref 2.5–4.6)

## 2020-04-22 MED ORDER — FUROSEMIDE 40 MG PO TABS: 40.0000 mg | ORAL_TABLET | Freq: Every day | ORAL | 0 refills | Status: DC

## 2020-04-22 MED ORDER — FUROSEMIDE 40 MG PO TABS
40.0000 mg | ORAL_TABLET | Freq: Every day | ORAL | Status: DC
  Administered 2020-04-22: 40 mg via ORAL
  Filled 2020-04-22: qty 1

## 2020-04-22 MED ORDER — FUROSEMIDE 40 MG PO TABS: 40.0000 mg | ORAL_TABLET | Freq: Every day | ORAL | 0 refills | Status: DC | PRN

## 2020-04-22 MED ORDER — FUROSEMIDE 40 MG PO TABS: 40.0000 mg | ORAL_TABLET | Freq: Every day | ORAL | Status: DC | PRN

## 2020-04-22 NOTE — Plan of Care (Signed)

## 2020-04-22 NOTE — Plan of Care (Signed)
Discharge instructions reviewed with wife and patient, questions answered, verbalized understanding.  Provided education to wife on care of foley, how to switch back and forth between leg bag and standard bedside drainage bag.  Patient has had foley at home in the past.  Patient transported to main entrance via wheelchair to be taken home by wife.

## 2020-04-22 NOTE — Progress Notes (Addendum)
[facility] Progress Note  Subjective: Creat down 4.4 > 4.2 > 4.0 today.  3 L UOP yesterday. Up in chair eating breakfast .     Vitals:   04/22/20 0000 04/22/20 0232 04/22/20 0500 04/22/20 0843  BP:  (!) 142/51  (!) 157/49  Pulse:  63  62  Resp: 17 18  20   Temp:  98.2 F (36.8 C)  97.9 F (36.6 C)  TempSrc:  Oral  Oral  SpO2:  (!) 87%  96%  Weight:   58.7 kg   Height:        Exam:   Alert and eating breakfast, sitting up in chair  no jvd  Chest cta bilat  Cor reg no RG  Abd soft ntnd no ascites   Ext trace LE edema   Alert, NF, ox3    12/26  UA - negative   Wt 63kg admit > 58.7kg today      Assessment/ Plan: 1. AKI on CKD 4 - b/l creat 2.8- 3.4 recently. 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 Here UA is neg, CT w/o hydronephrosis, small/ atrophic kidneys. AKI due to urinary retention. Has foley in , will need urology f/u for this issue.  Will arrange renal f/u w/ Dr Marval Regal. OK for discharge on lasix 40 qam and prn evening 40 mg dose if + fluid/ edema. Will sign off.  2. SOB/ pleural effusion - no pulm edema per CT. Diuresed here w/ IV lasix and is down 5kg today. Edema resolved on exam, will switch over to maint po lasix (see orders).   3. Urinary retention / hx BPH - foley in place, will need urology f/u.  On flomax.  4. HTN - cont home meds as needed.  5. HD access - has L AVF pr Dr Oneida Alar 03/04/20, slow to mature, poss will be ready in February.    Jeffrey Payne 04/22/2020, 10:34 AM   Recent Labs  Lab 04/21/20 0328 04/22/20 0434  K 4.2 3.5  BUN 57* 71*  CREATININE 4.29* 4.03*  CALCIUM 9.3 9.5  PHOS 4.9* 5.0*  HGB 9.9* 10.2*   Inpatient medications: . amLODipine  10 mg Oral Daily  . calcitRIOL  0.25 mcg Oral Daily  . Chlorhexidine Gluconate Cloth  6 each Topical Daily  . folic acid  1 mg Oral Daily  . heparin  5,000 Units Subcutaneous Q8H  . metoprolol succinate  50 mg Oral QHS  .  morphine injection  4 mg  Intravenous Once  . multivitamin with minerals  1 tablet Oral Daily  . polyethylene glycol  17 g Oral BID  . senna-docusate  2 tablet Oral BID  . sodium bicarbonate  650 mg Oral BID  . thiamine  100 mg Oral Daily   Or  . thiamine  100 mg Intravenous Daily    acetaminophen **OR** acetaminophen, LORazepam **OR** LORazepam, ondansetron **OR** ondansetron (ZOFRAN) IV

## 2020-04-22 NOTE — Discharge Summary (Signed)
Physician Discharge Summary  Dex Blakely WNU:[id number] DOB: 1932-07-23 DOA: 04/20/2020  PCP: Kristen Loader, FNP  Admit date: 04/20/2020 Discharge date: 04/22/2020  Admitted From: Home Disposition: Home  Recommendations for Outpatient Follow-up:  1. Follow up with PCP in 1-2 weeks 2. Follow-up with urology in 1 to 2 weeks as scheduled 3. Follow-up with nephrology as scheduled  Home Health: None Equipment/Devices: None  Discharge Condition: Stable CODE STATUS: Full Diet recommendation: Renal diet  Brief/Interim Summary: Kegan Mckeithan a 86 y.o.malewith medical history significant forCKD stageIVstatus post left brachiocephalic AV fistula procedure on 03/04/2020 by vascular surgery, Dr. Oneida Alar, hypertension, history of first-degree heart block presented to the emergency department for chief concerns of abdominal pain and urinary retention. He endorses a dull,suprapubic pain, that started abouttwo days ago,described aspersistent, and worse withstanding, 5/10 at it'speak and currentlycurrently a 0/10 after foley catheter was inserted. Patient's clinic nephrologist changed his Lasix to 60 mg(3 tablets)p.o. twice daily, he took this for two days only and then he stopped taking the 60 mg BID due to developing abdominal discomfort and instead only took 20 mg BID.  Patient admitted as above with acute urinary obstruction with Foley catheter placement, AKI on CKD 4 now resolving with supportive care, nephrology has been refusing Lasix to 40 mg daily with 40 mg as needed for edema.  Patient otherwise remains without symptoms, close follow-up with urology in the next few weeks per their schedule for further evaluation of urinary obstruction as well as follow-up with allergy in the outpatient setting for further evaluation for advanced kidney disease, fistula maturing well.  Patient otherwise stable for discharge home.  Discharge Diagnoses:  Active Problems:   AKI (acute kidney  injury) [facility])    Discharge Instructions  Discharge Instructions    Diet - low sodium heart healthy   Complete by: As directed    Discharge instructions   Complete by: As directed    Follow-up with urology as recommended, okay to remain intact office visit.  Follow-up with nephrology as scheduled.   Increase activity slowly   Complete by: As directed      Allergies as of 04/22/2020   No Known Allergies     Medication List    STOP taking these medications   hydrALAZINE 25 MG tablet Commonly known as: APRESOLINE   HYDROcodone-acetaminophen 5-325 MG tablet Commonly known as: Norco     TAKE these medications   acetaminophen 650 MG CR tablet Commonly known as: TYLENOL Take 650 mg by mouth daily.   amLODipine 10 MG tablet Commonly known as: NORVASC Take 10 mg by mouth daily.   Arnicare Arnica Crea Apply 1 application topically daily as needed (arthritis pain).   calcitRIOL 0.25 MCG capsule Commonly known as: ROCALTROL Take 0.25 mcg by mouth daily.   cetirizine 10 MG tablet Commonly known as: ZYRTEC Take 10 mg by mouth daily.   fluticasone 50 MCG/ACT nasal spray Commonly known as: FLONASE Place 1 spray into the nose daily as needed for allergies.   furosemide 40 MG tablet Commonly known as: LASIX Take 1 tablet (40 mg total) by mouth daily as needed for fluid or edema (take after dinner nightly prn for edema or excess fluid). What changed: You were already taking a medication with the same name, and this prescription was added. Make sure you understand how and when to take each.   furosemide 40 MG tablet Commonly known as: LASIX Take 1 tablet (40 mg total) by mouth daily. Start taking on: April 23, 2020  What changed:   medication strength  how much to take   metoprolol succinate 50 MG 24 hr tablet Commonly known as: TOPROL-XL Take 50 mg by mouth at bedtime.   tamsulosin 0.4 MG Caps capsule Commonly known as: FLOMAX Take 1 capsule (0.4 mg total) by  mouth daily after supper. What changed: when to take this       Follow-up Information    Janalee Dane, PA-C Follow up on 05/08/2020.   Specialty: Nephrology Why: Pt has kidney appt at 2:15 pm on May 08, 2020 w/ P.A. Anice Paganini (for Dr Marval Regal) Contact information: [place] [address] [phone]              No Known Allergies  Consultations:  Nephrology   Procedures/Studies: CT ABDOMEN PELVIS WO CONTRAST  Result Date: 04/20/2020 CLINICAL DATA:  86 year old male with abdominal pain. Concern for bowel obstruction. EXAM: CT ABDOMEN AND PELVIS WITHOUT CONTRAST TECHNIQUE: Multidetector CT imaging of the abdomen and pelvis was performed following the standard protocol without IV contrast. COMPARISON:  None. FINDINGS: Evaluation of this exam is limited in the absence of intravenous contrast. Lower chest: Partially visualized small bilateral pleural effusions with associated partial compressive atelectasis of the lower lobes. Pneumonia is not excluded clinical correlation is recommended. There is coronary vascular calcification. Pericardial effusion measuring up to 2 cm in thickness. Correlation with echocardiogram recommended. No intra-abdominal free air or free fluid. Hepatobiliary: The liver is unremarkable. No intrahepatic biliary ductal dilatation. The gallbladder is unremarkable. Pancreas: Unremarkable. No pancreatic ductal dilatation or surrounding inflammatory changes. Spleen: Normal in size without focal abnormality. Adrenals/Urinary Tract: The adrenal glands unremarkable. There is no hydronephrosis or nephrolithiasis on either side. There is a 13 cm left renal inferior pole cyst better evaluated on the ultrasound of 10/06/2019. The visualized ureters appear unremarkable. The urinary bladder is decompressed around a Foley catheter. Stomach/Bowel: There is no bowel obstruction or active inflammation. There are scattered colonic diverticula without active  inflammatory changes. The appendix is not visualized with certainty. No inflammatory changes identified in the right lower quadrant. Vascular/Lymphatic: Advanced aortoiliac atherosclerotic disease. The IVC is unremarkable. No portal venous gas. There is no adenopathy. Reproductive: Enlarged prostate gland measuring 6 cm in transverse axial diameter. The seminal vesicles are symmetric. Other: None Musculoskeletal: Degenerative changes of the spine. No acute osseous pathology. IMPRESSION: 1. No acute intra-abdominal or pelvic pathology. No bowel obstruction. 2. Colonic diverticulosis. 3. Partially visualized small bilateral pleural effusions with associated partial compressive atelectasis of the lower lobes. Pneumonia is not excluded clinical correlation is recommended. 4. Pericardial effusion measuring up to 2 cm in thickness. Correlation with echocardiogram recommended. 5. Aortic Atherosclerosis (ICD10-I70.0). Electronically Signed   By: Anner Crete M.D.   On: 04/20/2020 20:14   DG Chest 2 View  Result Date: 04/21/2020 CLINICAL DATA:  Pleural effusion. EXAM: CHEST - 2 VIEW COMPARISON:  October 05, 2019. FINDINGS: Stable cardiomegaly. No pneumothorax is noted. Mild bibasilar subsegmental atelectasis or edema is noted with small pleural effusions. Bony thorax is unremarkable. IMPRESSION: Mild bibasilar subsegmental atelectasis or edema with small pleural effusions. Aortic Atherosclerosis (ICD10-I70.0). Electronically Signed   By: Marijo Conception M.D.   On: 04/21/2020 13:14   VAS US DUPLEX DIALYSIS ACCESS (AVF, AVG)  Result Date: 04/15/2020 DIALYSIS ACCESS Reason for Exam: Routine follow up. Access Site: Left Upper Extremity. Access Type: Brachial-cephalic AVF. History: 03/04/20 AVF creation. Comparison Study: none Performing Technologist: June Leap RDMS, RVT  Examination Guidelines: A complete evaluation includes  B-mode imaging, spectral Doppler, color Doppler, and power Doppler as needed of all  accessible portions of each vessel. Unilateral testing is considered an integral part of a complete examination. Limited examinations for reoccurring indications may be performed as noted.  Findings: +--------------------+----------+-----------------+--------+ AVF                 PSV (cm/s)Flow Vol (mL/min)Comments +--------------------+----------+-----------------+--------+ Native artery inflow   224           607                +--------------------+----------+-----------------+--------+ AVF Anastomosis        662                              +--------------------+----------+-----------------+--------+  +------------+----------+-------------+----------+----------------+ OUTFLOW VEINPSV (cm/s)Diameter (cm)Depth (cm)    Describe     +------------+----------+-------------+----------+----------------+ Prox UA        220        0.47        0.32   competing branch +------------+----------+-------------+----------+----------------+ Mid UA         136        0.48        0.39       tortuous     +------------+----------+-------------+----------+----------------+ Dist UA        262        0.53        0.27                    +------------+----------+-------------+----------+----------------+ AC Fossa       201        0.55        0.31                    +------------+----------+-------------+----------+----------------+   Summary: Patent arteriovenous fistula. *See table(s) above for measurements and observations.  Diagnosing physician: Monica Martinez MD Electronically signed by Monica Martinez MD on 04/15/2020 at 1:26:25 PM.   --------------------------------------------------------------------------------   Final    ECHOCARDIOGRAM COMPLETE  Result Date: 04/21/2020    ECHOCARDIOGRAM REPORT   Patient Name:   SUMIT BRANHAM Date of Exam: 04/21/2020 Medical Rec #:  [id number]      Height:       67.0 in Accession #:    [id number]     Weight:       137.1 lb Date of Birth:   07-08-1932      BSA:          1.723 m Patient Age:    86 years       BP:           131/54 mmHg Patient Gender: M              HR:           68 bpm. Exam Location:  Inpatient Procedure: 2D Echo, Cardiac Doppler and Color Doppler Indications:    Dyspnea R06.00                 Pericardial effusion 423.9 / I31.3  History:        Patient has prior history of Echocardiogram examinations, most                 recent 10/06/2019. Risk Factors:Hypertension.  Sonographer:    Bernadene Person RDCS Referring Phys: [id number] AMY N [facility] IMPRESSIONS  1. Left ventricular ejection fraction, by estimation, is 50 to 55%. The left ventricle has  low normal function. The left ventricle has no regional wall motion abnormalities. There is mild concentric left ventricular hypertrophy. Left ventricular diastolic parameters are consistent with Grade I diastolic dysfunction (impaired relaxation).  2. Right ventricular systolic function is normal. The right ventricular size is normal.  3. Left atrial size was mild to moderately dilated.  4. A small pericardial effusion is present. The pericardial effusion is circumferential. There is no evidence of cardiac tamponade.  5. The mitral valve is degenerative. Trivial mitral valve regurgitation.  6. Eccentric aortic regurgitation noted, study may underestimate degree of regurgitation. The aortic valve is tricuspid. Aortic valve regurgitation is mild. Mild aortic valve sclerosis is present, with no evidence of aortic valve stenosis.  7. Aortic dilatation noted. There is borderline dilatation of the aortic root, measuring 37 mm.  8. The inferior vena cava is normal in size with <50% respiratory variability, suggesting right atrial pressure of 8 mmHg. Comparison(s): A prior study was performed on 10/06/2019. Similar aortic root size, similar left ventricular size, with small pericardiaul efffusion present. FINDINGS  Left Ventricle: Left ventricular ejection fraction, by estimation, is 50 to 55%. The left  ventricle has low normal function. The left ventricle has no regional wall motion abnormalities. The left ventricular internal cavity size was normal in size. There is mild concentric left ventricular hypertrophy. Left ventricular diastolic parameters are consistent with Grade I diastolic dysfunction (impaired relaxation). Right Ventricle: The right ventricular size is normal. Right vetricular wall thickness was not well visualized. Right ventricular systolic function is normal. Left Atrium: Left atrial size was mild to moderately dilated. Right Atrium: Right atrial size was normal in size. Pericardium: A small pericardial effusion is present. The pericardial effusion is circumferential. There is no evidence of cardiac tamponade. Mitral Valve: The mitral valve is degenerative in appearance. Trivial mitral valve regurgitation. Tricuspid Valve: The tricuspid valve is grossly normal. Tricuspid valve regurgitation is not demonstrated. Aortic Valve: Eccentric aortic regurgitation noted, study may underestimate degree of regurgitation. The aortic valve is tricuspid. Aortic valve regurgitation is mild. Aortic regurgitation PHT measures 565 msec. Mild aortic valve sclerosis is present, with no evidence of aortic valve stenosis. Pulmonic Valve: Discrepancy between Color and Spectral Doppler flow. The pulmonic valve was not well visualized. Pulmonic valve regurgitation is not visualized. Aorta: Aortic dilatation noted. There is borderline dilatation of the aortic root, measuring 37 mm. Venous: The inferior vena cava is normal in size with less than 50% respiratory variability, suggesting right atrial pressure of 8 mmHg. IAS/Shunts: The atrial septum is grossly normal.  LEFT VENTRICLE PLAX 2D LVIDd:         5.20 cm  Diastology LVIDs:         3.50 cm  LV e' medial:    6.45 cm/s LV PW:         1.10 cm  LV E/e' medial:  14.3 LV IVS:        1.00 cm  LV e' lateral:   6.60 cm/s LVOT diam:     2.10 cm  LV E/e' lateral: 14.0 LV SV:          105 LV SV Index:   61 LVOT Area:     3.46 cm  RIGHT VENTRICLE RV S prime:     11.10 cm/s TAPSE (M-mode): 2.1 cm LEFT ATRIUM             Index       RIGHT ATRIUM           Index LA  diam:        5.40 cm 3.13 cm/m  RA Area:     13.30 cm LA Vol (A2C):   66.9 ml 38.84 ml/m RA Volume:   25.80 ml  14.98 ml/m LA Vol (A4C):   75.3 ml 43.71 ml/m LA Biplane Vol: 74.5 ml 43.25 ml/m  AORTIC VALVE LVOT Vmax:   128.00 cm/s LVOT Vmean:  87.000 cm/s LVOT VTI:    0.302 m AI PHT:      565 msec  AORTA Ao Root diam: 3.70 cm Ao Asc diam:  3.10 cm MITRAL VALVE MV Area (PHT): 4.15 cm    SHUNTS MV Decel Time: 183 msec    Systemic VTI:  0.30 m MV E velocity: 92.50 cm/s  Systemic Diam: 2.10 cm MV A velocity: 96.80 cm/s MV E/A ratio:  0.96 Rudean Haskell MD Electronically signed by Rudean Haskell MD Signature Date/Time: 04/21/2020/12:20:04 PM    Final       Subjective: No acute issues or events overnight   Discharge Exam: Vitals:   04/22/20 0232 04/22/20 0843  BP: (!) 142/51 (!) 157/49  Pulse: 63 62  Resp: 18 20  Temp: 98.2 F (36.8 C) 97.9 F (36.6 C)  SpO2: (!) 87% 96%   Vitals:   04/22/20 0000 04/22/20 0232 04/22/20 0500 04/22/20 0843  BP:  (!) 142/51  (!) 157/49  Pulse:  63  62  Resp: 17 18  20   Temp:  98.2 F (36.8 C)  97.9 F (36.6 C)  TempSrc:  Oral  Oral  SpO2:  (!) 87%  96%  Weight:   58.7 kg   Height:        General: Pt is alert, awake, not in acute distress Cardiovascular: RRR, S1/S2 +, no rubs, no gallops Respiratory: CTA bilaterally, no wheezing, no rhonchi Abdominal: Soft, NT, ND, bowel sounds + Extremities: no edema, no cyanosis, left upper extremity fistula palpable thrill    The results of significant diagnostics from this hospitalization (including imaging, microbiology, ancillary and laboratory) are listed below for reference.     Microbiology: Recent Results (from the past 240 hour(s))  Urine culture     Status: None   Collection Time: 04/20/20  6:19 PM    Specimen: Urine, Random  Result Value Ref Range Status   Specimen Description   Final    URINE, RANDOM Performed at [facility] [address] [place], [place] [address]    Special Requests   Final    NONE Performed at [facility], [place] [address], [place], [place] [address]    Culture   Final    NO GROWTH Performed at [facility], [place] [address], [place], [place] [address]    Report Status 04/21/2020 FINAL  Final  Resp Panel by RT-PCR (Flu A&B, Covid) Nasopharyngeal Swab     Status: None   Collection Time: 04/20/20 10:51 PM   Specimen: Nasopharyngeal Swab; Nasopharyngeal(NP) swabs in vial transport medium  Result Value Ref Range Status   SARS Coronavirus 2 by RT PCR NEGATIVE NEGATIVE Final    Comment: (NOTE) SARS-CoV-2 target nucleic acids are NOT DETECTED.  The SARS-CoV-2 RNA is generally detectable in upper respiratory specimens during the acute phase of infection. The lowest concentration of SARS-CoV-2 viral copies this assay can detect is 138 copies/mL. A negative result does not preclude SARS-Cov-2 infection and should not be used as the sole basis for treatment or other patient management decisions. A negative result may occur with  improper specimen collection/handling, submission of specimen other than nasopharyngeal swab, presence of viral mutation(s) within the areas targeted by this assay, and inadequate number of viral copies(<138 copies/mL). A negative result must be combined with clinical observations, patient history, and epidemiological information. The expected result is Negative.  Fact Sheet for Patients:  EntrepreneurPulse.com.au  Fact Sheet for Healthcare Providers:  IncredibleEmployment.be  This test is no t yet approved or cleared by the [place] FDA and  has been authorized for detection and/or diagnosis of SARS-CoV-2 by FDA under an Emergency Use  Authorization (EUA). This EUA will remain  in effect (meaning this test can be used) for the duration of the COVID-19 declaration under Section 564(b)(1) of the Act, 21 U.S.C.section 360bbb-3(b)(1), unless the authorization is terminated  or revoked sooner.       Influenza A by PCR NEGATIVE NEGATIVE Final   Influenza B by PCR NEGATIVE NEGATIVE Final    Comment: (NOTE) The Xpert Xpress SARS-CoV-2/FLU/RSV plus assay is intended as an aid in the diagnosis of influenza from Nasopharyngeal swab specimens and should not be used as a sole basis for treatment. Nasal washings and aspirates are unacceptable for Xpert Xpress SARS-CoV-2/FLU/RSV testing.  Fact Sheet for Patients: EntrepreneurPulse.com.au  Fact Sheet for Healthcare Providers: IncredibleEmployment.be  This test is not yet approved or cleared by the [place] FDA and has been authorized for detection and/or diagnosis of SARS-CoV-2 by FDA under an Emergency Use Authorization (EUA). This EUA will remain in effect (meaning this test can be used) for the duration of the COVID-19 declaration under Section 564(b)(1) of the Act, 21 U.S.C. section 360bbb-3(b)(1), unless the authorization is terminated or revoked.  Performed at [facility], [place] [address], [place], [place] [address]      Labs: BNP (last 3 results) Recent Labs    10/05/19 1349 04/20/20 1819  BNP 610.4* 3,614.4*   Basic Metabolic Panel: Recent Labs  Lab 04/20/20 1819 04/20/20 2331 04/21/20 0328 04/22/20 0434  NA 133*  --  132* 136  K 4.8  --  4.2 3.5  CL 100  --  101 101  CO2 19*  --  21* 24  GLUCOSE 104*  --  88 91  BUN 68*  --  57* 71*  CREATININE 4.41*  --  4.29* 4.03*  CALCIUM 10.0  --  9.3 9.5  MG  --  1.8  --   --   PHOS  --  4.3 4.9* 5.0*   Liver Function Tests: Recent Labs  Lab 04/20/20 1819 04/22/20 0434  AST 19 23  ALT 13 17  ALKPHOS 88 75  BILITOT 0.8 0.6  PROT 7.0 5.7*   ALBUMIN 3.6 2.8*   Recent Labs  Lab 04/20/20 1819  LIPASE 32   No results for input(s): AMMONIA in the last 168 hours. CBC: Recent Labs  Lab 04/20/20 1819 04/20/20 2331 04/21/20 0328 04/22/20 0434  WBC 19.7* 15.0* 11.6* 7.6  NEUTROABS 16.6*  --  8.5*  --   HGB 11.4* 9.9* 9.9* 10.2*  HCT 35.1* 30.6* 30.4* 31.0*  MCV 98.0 99.0 98.4 97.2  PLT 305 257 254 255   Cardiac Enzymes: No results for input(s): CKTOTAL, CKMB, CKMBINDEX, TROPONINI in the last 168 hours. BNP: Invalid input(s): POCBNP CBG: No results for input(s): GLUCAP in the last 168 hours. D-Dimer No results for input(s): DDIMER in the last 72 hours. Hgb A1c No results for input(s): HGBA1C in the last 72 hours. Lipid Profile No results for input(s): CHOL, HDL,  LDLCALC, TRIG, CHOLHDL, LDLDIRECT in the last 72 hours. Thyroid function studies Recent Labs    04/21/20 0328  TSH 2.312   Anemia work up No results for input(s): VITAMINB12, FOLATE, FERRITIN, TIBC, IRON, RETICCTPCT in the last 72 hours. Urinalysis    Component Value Date/Time   COLORURINE YELLOW 04/20/2020 1819   APPEARANCEUR CLEAR 04/20/2020 1819   LABSPEC 1.011 04/20/2020 1819   PHURINE 5.0 04/20/2020 1819   GLUCOSEU NEGATIVE 04/20/2020 1819   HGBUR NEGATIVE 04/20/2020 1819   BILIRUBINUR NEGATIVE 04/20/2020 1819   KETONESUR NEGATIVE 04/20/2020 1819   PROTEINUR 100 (A) 04/20/2020 1819   NITRITE NEGATIVE 04/20/2020 1819   LEUKOCYTESUR NEGATIVE 04/20/2020 1819   Sepsis Labs Invalid input(s): PROCALCITONIN,  WBC,  LACTICIDVEN Microbiology Recent Results (from the past 240 hour(s))  Urine culture     Status: None   Collection Time: 04/20/20  6:19 PM   Specimen: Urine, Random  Result Value Ref Range Status   Specimen Description   Final    URINE, RANDOM Performed at [facility], [place] [address] [place], [place] [address]    Special Requests   Final    NONE Performed at [facility], [place] [address], [place], [place] [address]    Culture   Final    NO GROWTH Performed at [facility], [place] [address] [place], [place] [address]    Report Status 04/21/2020 FINAL  Final  Resp Panel by RT-PCR (Flu A&B, Covid) Nasopharyngeal Swab     Status: None   Collection Time: 04/20/20 10:51 PM   Specimen: Nasopharyngeal Swab; Nasopharyngeal(NP) swabs in vial transport medium  Result Value Ref Range Status   SARS Coronavirus 2 by RT PCR NEGATIVE NEGATIVE Final    Comment: (NOTE) SARS-CoV-2 target nucleic acids are NOT DETECTED.  The SARS-CoV-2 RNA is generally detectable in upper respiratory specimens during the acute phase of infection. The lowest concentration of SARS-CoV-2 viral copies this assay can detect is 138 copies/mL. A negative result does not preclude SARS-Cov-2 infection and should not be used as the sole basis for treatment or other patient management decisions. A negative result may occur with  improper specimen collection/handling, submission of specimen other than nasopharyngeal swab, presence of viral mutation(s) within the areas targeted by this assay, and inadequate number of viral copies(<138 copies/mL). A negative result must be combined with clinical observations, patient history, and epidemiological information. The expected result is Negative.  Fact Sheet for Patients:  EntrepreneurPulse.com.au  Fact Sheet for Healthcare Providers:  IncredibleEmployment.be  This test is no t yet approved or cleared by the [place] FDA and  has been authorized for detection and/or diagnosis of SARS-CoV-2 by FDA under an Emergency Use Authorization (EUA). This EUA will remain  in effect (meaning this test can be used) for the duration of the COVID-19 declaration under Section 564(b)(1) of the Act, 21 U.S.C.section 360bbb-3(b)(1), unless the authorization is terminated  or revoked sooner.       Influenza A by PCR NEGATIVE NEGATIVE  Final   Influenza B by PCR NEGATIVE NEGATIVE Final    Comment: (NOTE) The Xpert Xpress SARS-CoV-2/FLU/RSV plus assay is intended as an aid in the diagnosis of influenza from Nasopharyngeal swab specimens and should not be used as a sole basis for treatment. Nasal washings and aspirates are unacceptable for Xpert Xpress SARS-CoV-2/FLU/RSV testing.  Fact Sheet for Patients: EntrepreneurPulse.com.au  Fact Sheet for Healthcare Providers: IncredibleEmployment.be  This test is not yet approved or cleared by  the [employer] and has been authorized for detection and/or diagnosis of SARS-CoV-2 by FDA under an Emergency Use Authorization (EUA). This EUA will remain in effect (meaning this test can be used) for the duration of the COVID-19 declaration under Section 564(b)(1) of the Act, 21 U.S.C. section 360bbb-3(b)(1), unless the authorization is terminated or revoked.  Performed at [facility], [place] [address] [place], [place] [address]      Time coordinating discharge: Over 30 minutes  SIGNED:   Little Ishikawa, DO [facility] 04/22/2020, 12:00 PM Pager   If 7PM-7AM, please contact night-coverage [email or web address]

## 2020-04-26 LAB — CULTURE, BLOOD (ROUTINE X 2)
Culture: NO GROWTH
Culture: NO GROWTH
Special Requests: ADEQUATE
Special Requests: ADEQUATE

## 2020-05-01 DIAGNOSIS — R338 Other retention of urine: Secondary | ICD-10-CM | POA: Diagnosis not present

## 2020-05-08 DIAGNOSIS — N184 Chronic kidney disease, stage 4 (severe): Secondary | ICD-10-CM | POA: Diagnosis not present

## 2020-05-08 DIAGNOSIS — R809 Proteinuria, unspecified: Secondary | ICD-10-CM | POA: Diagnosis not present

## 2020-05-08 DIAGNOSIS — I770 Arteriovenous fistula, acquired: Secondary | ICD-10-CM | POA: Diagnosis not present

## 2020-05-08 DIAGNOSIS — N189 Chronic kidney disease, unspecified: Secondary | ICD-10-CM | POA: Diagnosis not present

## 2020-05-08 DIAGNOSIS — I129 Hypertensive chronic kidney disease with stage 1 through stage 4 chronic kidney disease, or unspecified chronic kidney disease: Secondary | ICD-10-CM | POA: Diagnosis not present

## 2020-05-08 DIAGNOSIS — D631 Anemia in chronic kidney disease: Secondary | ICD-10-CM | POA: Diagnosis not present

## 2020-05-08 DIAGNOSIS — N2581 Secondary hyperparathyroidism of renal origin: Secondary | ICD-10-CM | POA: Diagnosis not present

## 2020-05-09 DIAGNOSIS — R338 Other retention of urine: Secondary | ICD-10-CM | POA: Diagnosis not present

## 2020-05-20 DIAGNOSIS — R338 Other retention of urine: Secondary | ICD-10-CM | POA: Diagnosis not present

## 2020-05-23 DIAGNOSIS — N184 Chronic kidney disease, stage 4 (severe): Secondary | ICD-10-CM | POA: Diagnosis not present

## 2020-05-23 DIAGNOSIS — R809 Proteinuria, unspecified: Secondary | ICD-10-CM | POA: Diagnosis not present

## 2020-05-23 DIAGNOSIS — D631 Anemia in chronic kidney disease: Secondary | ICD-10-CM | POA: Diagnosis not present

## 2020-05-23 DIAGNOSIS — N281 Cyst of kidney, acquired: Secondary | ICD-10-CM | POA: Diagnosis not present

## 2020-05-23 DIAGNOSIS — I129 Hypertensive chronic kidney disease with stage 1 through stage 4 chronic kidney disease, or unspecified chronic kidney disease: Secondary | ICD-10-CM | POA: Diagnosis not present

## 2020-05-23 DIAGNOSIS — R339 Retention of urine, unspecified: Secondary | ICD-10-CM | POA: Diagnosis not present

## 2020-05-28 DIAGNOSIS — R338 Other retention of urine: Secondary | ICD-10-CM | POA: Diagnosis not present

## 2020-06-02 DIAGNOSIS — R339 Retention of urine, unspecified: Secondary | ICD-10-CM | POA: Diagnosis not present

## 2020-06-03 DIAGNOSIS — R338 Other retention of urine: Secondary | ICD-10-CM | POA: Diagnosis not present

## 2020-06-26 DIAGNOSIS — N184 Chronic kidney disease, stage 4 (severe): Secondary | ICD-10-CM | POA: Diagnosis not present

## 2020-07-02 DIAGNOSIS — D225 Melanocytic nevi of trunk: Secondary | ICD-10-CM | POA: Diagnosis not present

## 2020-07-02 DIAGNOSIS — X32XXXD Exposure to sunlight, subsequent encounter: Secondary | ICD-10-CM | POA: Diagnosis not present

## 2020-07-02 DIAGNOSIS — D1801 Hemangioma of skin and subcutaneous tissue: Secondary | ICD-10-CM | POA: Diagnosis not present

## 2020-07-02 DIAGNOSIS — L821 Other seborrheic keratosis: Secondary | ICD-10-CM | POA: Diagnosis not present

## 2020-07-02 DIAGNOSIS — L570 Actinic keratosis: Secondary | ICD-10-CM | POA: Diagnosis not present

## 2020-07-02 DIAGNOSIS — C44319 Basal cell carcinoma of skin of other parts of face: Secondary | ICD-10-CM | POA: Diagnosis not present

## 2020-07-07 DIAGNOSIS — N281 Cyst of kidney, acquired: Secondary | ICD-10-CM | POA: Diagnosis not present

## 2020-07-07 DIAGNOSIS — R339 Retention of urine, unspecified: Secondary | ICD-10-CM | POA: Diagnosis not present

## 2020-07-07 DIAGNOSIS — D631 Anemia in chronic kidney disease: Secondary | ICD-10-CM | POA: Diagnosis not present

## 2020-07-07 DIAGNOSIS — I770 Arteriovenous fistula, acquired: Secondary | ICD-10-CM | POA: Diagnosis not present

## 2020-07-07 DIAGNOSIS — N189 Chronic kidney disease, unspecified: Secondary | ICD-10-CM | POA: Diagnosis not present

## 2020-07-07 DIAGNOSIS — N2581 Secondary hyperparathyroidism of renal origin: Secondary | ICD-10-CM | POA: Diagnosis not present

## 2020-07-07 DIAGNOSIS — R809 Proteinuria, unspecified: Secondary | ICD-10-CM | POA: Diagnosis not present

## 2020-07-07 DIAGNOSIS — N184 Chronic kidney disease, stage 4 (severe): Secondary | ICD-10-CM | POA: Diagnosis not present

## 2020-07-07 DIAGNOSIS — I129 Hypertensive chronic kidney disease with stage 1 through stage 4 chronic kidney disease, or unspecified chronic kidney disease: Secondary | ICD-10-CM | POA: Diagnosis not present

## 2020-07-10 ENCOUNTER — Telehealth: Payer: Self-pay

## 2020-07-10 ENCOUNTER — Other Ambulatory Visit: Payer: Self-pay

## 2020-07-10 DIAGNOSIS — N184 Chronic kidney disease, stage 4 (severe): Secondary | ICD-10-CM

## 2020-07-10 NOTE — Telephone Encounter (Signed)
Patient's wife called about "tweaking patient's fistula" per Dr. Arty Baumgartner. Fistula was placed in November by [facility]. Could not find documentation about needing f/u appt with [facility], so I called [facility]. Per Colodonato's assistant, Sharice - fistula is poorly maturing. Patient is not currently on HD. Placed patient on the schedule next week for access duplex and follow up.

## 2020-07-17 ENCOUNTER — Other Ambulatory Visit: Payer: Self-pay

## 2020-07-17 ENCOUNTER — Encounter: Payer: Self-pay | Admitting: Vascular Surgery

## 2020-07-17 ENCOUNTER — Ambulatory Visit: Payer: Medicare Other | Admitting: Vascular Surgery

## 2020-07-17 ENCOUNTER — Ambulatory Visit (HOSPITAL_COMMUNITY)
Admission: RE | Admit: 2020-07-17 | Discharge: 2020-07-17 | Disposition: A | Discharge status: Home / Self Care | Payer: Medicare Other | Source: Ambulatory Visit | Attending: Vascular Surgery | Admitting: Vascular Surgery

## 2020-07-17 VITALS — BP 183/51 | HR 64 | Temp 97.6°F | Resp 20 | Ht 67.0 in | Wt 134.0 lb

## 2020-07-17 DIAGNOSIS — N184 Chronic kidney disease, stage 4 (severe): Secondary | ICD-10-CM | POA: Diagnosis not present

## 2020-07-17 DIAGNOSIS — N185 Chronic kidney disease, stage 5: Secondary | ICD-10-CM | POA: Diagnosis not present

## 2020-07-17 NOTE — H&P (View-Only) (Signed)
Patient is an 87 year old male who returns for follow-up today.  He previously had a left brachiocephalic AV fistula placed in November 2021.  This is failed to mature.  He is not currently on hemodialysis but is now CKD 5.  I discussed the patient with Dr. Arty Baumgartner by phone today.  He states that the patient is rapidly approaching need for hemodialysis and thought we needed aggressive approach to his fistula.  The patient does not have any numbness or tingling in his hand.  Review of systems: He has no shortness of breath.  He has no chest pain.  Past Medical History:  Diagnosis Date  . Arthritis    knees  . Cancer [facility])    prostate  . CKD (chronic kidney disease) stage 3, GFR 30-59 ml/min (HCC)   . ED (erectile dysfunction)   . GERD (gastroesophageal reflux disease)   . Hard of hearing   . Heart murmur   . Hypertension   . Hypertriglyceridemia   . Rosacea     Past Surgical History:  Procedure Laterality Date  . AV FISTULA PLACEMENT Left 03/04/2020   Procedure: BRACHIOCEPHALIC ARTERIOVENOUS (AV) FISTULA CREATION LEFT;  Surgeon: Elam Dutch, MD;  Location: [facility] OR;  Service: Vascular;  Laterality: Left;  . COLONOSCOPY    . renal cyst biopsy    . TONSILLECTOMY      Current Outpatient Medications on File Prior to Visit  Medication Sig Dispense Refill  . acetaminophen (TYLENOL) 650 MG CR tablet Take 650 mg by mouth daily.    Marland Kitchen amLODipine (NORVASC) 10 MG tablet Take 10 mg by mouth daily.    . calcitRIOL (ROCALTROL) 0.25 MCG capsule Take 0.25 mcg by mouth daily.     . cetirizine (ZYRTEC) 10 MG tablet Take 10 mg by mouth daily.    . fluticasone (FLONASE) 50 MCG/ACT nasal spray Place 1 spray into the nose daily as needed for allergies.    . furosemide (LASIX) 40 MG tablet Take 1 tablet (40 mg total) by mouth daily. 30 tablet 0  . furosemide (LASIX) 40 MG tablet Take 1 tablet (40 mg total) by mouth daily as needed for fluid or edema (take after dinner nightly prn for edema or excess  fluid). 30 tablet 0  . Homeopathic Products (ARNICARE ARNICA) CREA Apply 1 application topically daily as needed (arthritis pain).    . metoprolol succinate (TOPROL-XL) 50 MG 24 hr tablet Take 50 mg by mouth at bedtime.   1  . tamsulosin (FLOMAX) 0.4 MG CAPS capsule Take 1 capsule (0.4 mg total) by mouth daily after supper. (Patient taking differently: Take 0.4 mg by mouth at bedtime.) 30 capsule 0   No current facility-administered medications on file prior to visit.    No Known Allergies  Physical exam:  Vitals:   07/17/20 1502  BP: (!) 183/51  Pulse: 64  Resp: 20  Temp: 97.6 F (36.4 C)  SpO2: 96%  Weight: 134 lb (60.8 kg)  Height: 5\' 7"  (1.702 m)    Left upper extremity fistula slightly pulsatile the proximal aspect the fistula is palpable but is small in diameter.  Data: Patient had duplex ultrasound performed today which showed the fistula 7 mm proximally but tapers to about 3 mm distally.  Assessment: Nonmaturing AV fistula left arm.  Plan: Left arm fistulogram plus minus intervention scheduled for July 25, 2020.  Risk benefits possible complications and procedure details were discussed the patient and his wife today.  They understand the risk of contrast nephropathy.  I did discuss with him that I spoke with Dr. Arty Baumgartner who suggested we should proceed.  If we find something straightforward to angioplasty we will do this.  If there are multiple segments of narrowing and this is unamenable to percutaneous intervention we may convert this to an AV graft the following week.  All this was discussed with the patient and his wife today.  Ruta Hinds, MD [facility] [place] Office: [phone]

## 2020-07-17 NOTE — Progress Notes (Signed)
Patient is an 87 year old male who returns for follow-up today.  He previously had a left brachiocephalic AV fistula placed in November 2021.  This is failed to mature.  He is not currently on hemodialysis but is now CKD 5.  I discussed the patient with Dr. Arty Baumgartner by phone today.  He states that the patient is rapidly approaching need for hemodialysis and thought we needed aggressive approach to his fistula.  The patient does not have any numbness or tingling in his hand.  Review of systems: He has no shortness of breath.  He has no chest pain.  Past Medical History:  Diagnosis Date  . Arthritis    knees  . Cancer [facility])    prostate  . CKD (chronic kidney disease) stage 3, GFR 30-59 ml/min (HCC)   . ED (erectile dysfunction)   . GERD (gastroesophageal reflux disease)   . Hard of hearing   . Heart murmur   . Hypertension   . Hypertriglyceridemia   . Rosacea     Past Surgical History:  Procedure Laterality Date  . AV FISTULA PLACEMENT Left 03/04/2020   Procedure: BRACHIOCEPHALIC ARTERIOVENOUS (AV) FISTULA CREATION LEFT;  Surgeon: Elam Dutch, MD;  Location: [facility] OR;  Service: Vascular;  Laterality: Left;  . COLONOSCOPY    . renal cyst biopsy    . TONSILLECTOMY      Current Outpatient Medications on File Prior to Visit  Medication Sig Dispense Refill  . acetaminophen (TYLENOL) 650 MG CR tablet Take 650 mg by mouth daily.    Marland Kitchen amLODipine (NORVASC) 10 MG tablet Take 10 mg by mouth daily.    . calcitRIOL (ROCALTROL) 0.25 MCG capsule Take 0.25 mcg by mouth daily.     . cetirizine (ZYRTEC) 10 MG tablet Take 10 mg by mouth daily.    . fluticasone (FLONASE) 50 MCG/ACT nasal spray Place 1 spray into the nose daily as needed for allergies.    . furosemide (LASIX) 40 MG tablet Take 1 tablet (40 mg total) by mouth daily. 30 tablet 0  . furosemide (LASIX) 40 MG tablet Take 1 tablet (40 mg total) by mouth daily as needed for fluid or edema (take after dinner nightly prn for edema or excess  fluid). 30 tablet 0  . Homeopathic Products (ARNICARE ARNICA) CREA Apply 1 application topically daily as needed (arthritis pain).    . metoprolol succinate (TOPROL-XL) 50 MG 24 hr tablet Take 50 mg by mouth at bedtime.   1  . tamsulosin (FLOMAX) 0.4 MG CAPS capsule Take 1 capsule (0.4 mg total) by mouth daily after supper. (Patient taking differently: Take 0.4 mg by mouth at bedtime.) 30 capsule 0   No current facility-administered medications on file prior to visit.    No Known Allergies  Physical exam:  Vitals:   07/17/20 1502  BP: (!) 183/51  Pulse: 64  Resp: 20  Temp: 97.6 F (36.4 C)  SpO2: 96%  Weight: 134 lb (60.8 kg)  Height: 5\' 7"  (1.702 m)    Left upper extremity fistula slightly pulsatile the proximal aspect the fistula is palpable but is small in diameter.  Data: Patient had duplex ultrasound performed today which showed the fistula 7 mm proximally but tapers to about 3 mm distally.  Assessment: Nonmaturing AV fistula left arm.  Plan: Left arm fistulogram plus minus intervention scheduled for July 25, 2020.  Risk benefits possible complications and procedure details were discussed the patient and his wife today.  They understand the risk of contrast nephropathy.  I did discuss with him that I spoke with Dr. Arty Baumgartner who suggested we should proceed.  If we find something straightforward to angioplasty we will do this.  If there are multiple segments of narrowing and this is unamenable to percutaneous intervention we may convert this to an AV graft the following week.  All this was discussed with the patient and his wife today.  Ruta Hinds, MD [facility] [place] Office: [phone]

## 2020-07-17 NOTE — H&P (View-Only) (Signed)
Patient is an 87 year old male who returns for follow-up today.  He previously had a left brachiocephalic AV fistula placed in November 2021.  This is failed to mature.  He is not currently on hemodialysis but is now CKD 5.  I discussed the patient with Dr. Arty Baumgartner by phone today.  He states that the patient is rapidly approaching need for hemodialysis and thought we needed aggressive approach to his fistula.  The patient does not have any numbness or tingling in his hand.  Review of systems: He has no shortness of breath.  He has no chest pain.  Past Medical History:  Diagnosis Date  . Arthritis    knees  . Cancer [facility])    prostate  . CKD (chronic kidney disease) stage 3, GFR 30-59 ml/min (HCC)   . ED (erectile dysfunction)   . GERD (gastroesophageal reflux disease)   . Hard of hearing   . Heart murmur   . Hypertension   . Hypertriglyceridemia   . Rosacea     Past Surgical History:  Procedure Laterality Date  . AV FISTULA PLACEMENT Left 03/04/2020   Procedure: BRACHIOCEPHALIC ARTERIOVENOUS (AV) FISTULA CREATION LEFT;  Surgeon: Elam Dutch, MD;  Location: [facility] OR;  Service: Vascular;  Laterality: Left;  . COLONOSCOPY    . renal cyst biopsy    . TONSILLECTOMY      Current Outpatient Medications on File Prior to Visit  Medication Sig Dispense Refill  . acetaminophen (TYLENOL) 650 MG CR tablet Take 650 mg by mouth daily.    Marland Kitchen amLODipine (NORVASC) 10 MG tablet Take 10 mg by mouth daily.    . calcitRIOL (ROCALTROL) 0.25 MCG capsule Take 0.25 mcg by mouth daily.     . cetirizine (ZYRTEC) 10 MG tablet Take 10 mg by mouth daily.    . fluticasone (FLONASE) 50 MCG/ACT nasal spray Place 1 spray into the nose daily as needed for allergies.    . furosemide (LASIX) 40 MG tablet Take 1 tablet (40 mg total) by mouth daily. 30 tablet 0  . furosemide (LASIX) 40 MG tablet Take 1 tablet (40 mg total) by mouth daily as needed for fluid or edema (take after dinner nightly prn for edema or excess  fluid). 30 tablet 0  . Homeopathic Products (ARNICARE ARNICA) CREA Apply 1 application topically daily as needed (arthritis pain).    . metoprolol succinate (TOPROL-XL) 50 MG 24 hr tablet Take 50 mg by mouth at bedtime.   1  . tamsulosin (FLOMAX) 0.4 MG CAPS capsule Take 1 capsule (0.4 mg total) by mouth daily after supper. (Patient taking differently: Take 0.4 mg by mouth at bedtime.) 30 capsule 0   No current facility-administered medications on file prior to visit.    No Known Allergies  Physical exam:  Vitals:   07/17/20 1502  BP: (!) 183/51  Pulse: 64  Resp: 20  Temp: 97.6 F (36.4 C)  SpO2: 96%  Weight: 134 lb (60.8 kg)  Height: 5\' 7"  (1.702 m)    Left upper extremity fistula slightly pulsatile the proximal aspect the fistula is palpable but is small in diameter.  Data: Patient had duplex ultrasound performed today which showed the fistula 7 mm proximally but tapers to about 3 mm distally.  Assessment: Nonmaturing AV fistula left arm.  Plan: Left arm fistulogram plus minus intervention scheduled for July 25, 2020.  Risk benefits possible complications and procedure details were discussed the patient and his wife today.  They understand the risk of contrast nephropathy.  I did discuss with him that I spoke with Dr. Arty Baumgartner who suggested we should proceed.  If we find something straightforward to angioplasty we will do this.  If there are multiple segments of narrowing and this is unamenable to percutaneous intervention we may convert this to an AV graft the following week.  All this was discussed with the patient and his wife today.  Ruta Hinds, MD [facility] [place] Office: [phone]

## 2020-07-24 ENCOUNTER — Other Ambulatory Visit (HOSPITAL_COMMUNITY)
Admission: RE | Admit: 2020-07-24 | Discharge: 2020-07-24 | Disposition: A | Discharge status: Home / Self Care | Payer: Medicare Other | Source: Ambulatory Visit | Attending: Vascular Surgery | Admitting: Vascular Surgery

## 2020-07-24 DIAGNOSIS — M170 Bilateral primary osteoarthritis of knee: Secondary | ICD-10-CM | POA: Diagnosis not present

## 2020-07-24 DIAGNOSIS — N184 Chronic kidney disease, stage 4 (severe): Secondary | ICD-10-CM | POA: Diagnosis not present

## 2020-07-24 DIAGNOSIS — Z01812 Encounter for preprocedural laboratory examination: Secondary | ICD-10-CM | POA: Insufficient documentation

## 2020-07-24 DIAGNOSIS — E785 Hyperlipidemia, unspecified: Secondary | ICD-10-CM | POA: Diagnosis not present

## 2020-07-24 DIAGNOSIS — N2581 Secondary hyperparathyroidism of renal origin: Secondary | ICD-10-CM | POA: Diagnosis not present

## 2020-07-24 DIAGNOSIS — E782 Mixed hyperlipidemia: Secondary | ICD-10-CM | POA: Diagnosis not present

## 2020-07-24 DIAGNOSIS — K219 Gastro-esophageal reflux disease without esophagitis: Secondary | ICD-10-CM | POA: Diagnosis not present

## 2020-07-24 DIAGNOSIS — I129 Hypertensive chronic kidney disease with stage 1 through stage 4 chronic kidney disease, or unspecified chronic kidney disease: Secondary | ICD-10-CM | POA: Diagnosis not present

## 2020-07-24 DIAGNOSIS — Z20822 Contact with and (suspected) exposure to covid-19: Secondary | ICD-10-CM | POA: Diagnosis not present

## 2020-07-24 LAB — SARS CORONAVIRUS 2 (TAT 6-24 HRS): SARS Coronavirus 2: NEGATIVE

## 2020-07-25 ENCOUNTER — Encounter (HOSPITAL_COMMUNITY): Payer: Self-pay | Admitting: Vascular Surgery

## 2020-07-25 ENCOUNTER — Other Ambulatory Visit: Payer: Self-pay

## 2020-07-25 ENCOUNTER — Ambulatory Visit (HOSPITAL_COMMUNITY)
Admission: RE | Admit: 2020-07-25 | Discharge: 2020-07-25 | Disposition: A | Discharge status: Home / Self Care | Payer: Medicare Other | Attending: Vascular Surgery | Admitting: Vascular Surgery

## 2020-07-25 ENCOUNTER — Encounter (HOSPITAL_COMMUNITY)
Admission: RE | Disposition: A | Discharge status: Home / Self Care | Payer: Self-pay | Source: Home / Self Care | Attending: Vascular Surgery

## 2020-07-25 DIAGNOSIS — N186 End stage renal disease: Secondary | ICD-10-CM | POA: Diagnosis not present

## 2020-07-25 DIAGNOSIS — T82898A Other specified complication of vascular prosthetic devices, implants and grafts, initial encounter: Secondary | ICD-10-CM | POA: Diagnosis not present

## 2020-07-25 DIAGNOSIS — I120 Hypertensive chronic kidney disease with stage 5 chronic kidney disease or end stage renal disease: Secondary | ICD-10-CM | POA: Diagnosis not present

## 2020-07-25 DIAGNOSIS — Z79899 Other long term (current) drug therapy: Secondary | ICD-10-CM | POA: Diagnosis not present

## 2020-07-25 DIAGNOSIS — Z992 Dependence on renal dialysis: Secondary | ICD-10-CM | POA: Diagnosis not present

## 2020-07-25 DIAGNOSIS — T82510A Breakdown (mechanical) of surgically created arteriovenous fistula, initial encounter: Secondary | ICD-10-CM | POA: Insufficient documentation

## 2020-07-25 DIAGNOSIS — Y841 Kidney dialysis as the cause of abnormal reaction of the patient, or of later complication, without mention of misadventure at the time of the procedure: Secondary | ICD-10-CM | POA: Insufficient documentation

## 2020-07-25 HISTORY — PX: A/V FISTULAGRAM: CATH118298

## 2020-07-25 LAB — POCT I-STAT, CHEM 8
BUN: 69 mg/dL — ABNORMAL HIGH (ref 8–23)
Calcium, Ion: 1.34 mmol/L (ref 1.15–1.40)
Chloride: 106 mmol/L (ref 98–111)
Creatinine, Ser: 3.8 mg/dL — ABNORMAL HIGH (ref 0.61–1.24)
Glucose, Bld: 101 mg/dL — ABNORMAL HIGH (ref 70–99)
HCT: 37 % — ABNORMAL LOW (ref 39.0–52.0)
Hemoglobin: 12.6 g/dL — ABNORMAL LOW (ref 13.0–17.0)
Potassium: 4.1 mmol/L (ref 3.5–5.1)
Sodium: 139 mmol/L (ref 135–145)
TCO2: 22 mmol/L (ref 22–32)

## 2020-07-25 SURGERY — A/V FISTULAGRAM
Anesthesia: LOCAL

## 2020-07-25 MED ORDER — OXYCODONE HCL 5 MG PO TABS
5.0000 mg | ORAL_TABLET | ORAL | Status: DC | PRN
Start: 2020-07-25 — End: 2020-07-25

## 2020-07-25 MED ORDER — SODIUM CHLORIDE 0.9% FLUSH: 3.0000 mL | INTRAVENOUS | Status: DC | PRN

## 2020-07-25 MED ORDER — LIDOCAINE HCL (PF) 1 % IJ SOLN
INTRAMUSCULAR | Status: DC | PRN
  Administered 2020-07-25: 3 mL via INTRADERMAL

## 2020-07-25 MED ORDER — SODIUM CHLORIDE 0.9 % IV SOLN: 250.0000 mL | INTRAVENOUS | Status: DC | PRN

## 2020-07-25 MED ORDER — HEPARIN (PORCINE) IN NACL 1000-0.9 UT/500ML-% IV SOLN
INTRAVENOUS | Status: DC | PRN
  Administered 2020-07-25: 500 mL

## 2020-07-25 MED ORDER — ONDANSETRON HCL 4 MG/2ML IJ SOLN: 4.0000 mg | Freq: Four times a day (QID) | INTRAMUSCULAR | Status: DC | PRN

## 2020-07-25 MED ORDER — ACETAMINOPHEN 325 MG PO TABS: 650.0000 mg | ORAL_TABLET | ORAL | Status: DC | PRN

## 2020-07-25 MED ORDER — LABETALOL HCL 5 MG/ML IV SOLN: 10.0000 mg | INTRAVENOUS | Status: DC | PRN

## 2020-07-25 MED ORDER — SODIUM CHLORIDE 0.9 % IV SOLN: INTRAVENOUS | Status: DC

## 2020-07-25 MED ORDER — IODIXANOL 320 MG/ML IV SOLN
INTRAVENOUS | Status: DC | PRN
  Administered 2020-07-25: 15 mL via INTRAVENOUS

## 2020-07-25 MED ORDER — HEPARIN (PORCINE) IN NACL 1000-0.9 UT/500ML-% IV SOLN
INTRAVENOUS | Status: AC
  Filled 2020-07-25: qty 500

## 2020-07-25 MED ORDER — SODIUM CHLORIDE 0.9% FLUSH: 3.0000 mL | Freq: Two times a day (BID) | INTRAVENOUS | Status: DC

## 2020-07-25 MED ORDER — HYDRALAZINE HCL 20 MG/ML IJ SOLN: 5.0000 mg | INTRAMUSCULAR | Status: DC | PRN

## 2020-07-25 MED ORDER — LIDOCAINE HCL (PF) 1 % IJ SOLN
INTRAMUSCULAR | Status: AC
  Filled 2020-07-25: qty 30

## 2020-07-25 SURGICAL SUPPLY — 9 items
BAG SNAP BAND KOVER 36X36 (MISCELLANEOUS) ×2 IMPLANT
COVER DOME SNAP 22 D (MISCELLANEOUS) ×2 IMPLANT
KIT MICROPUNCTURE NIT STIFF (SHEATH) ×2 IMPLANT
PROTECTION STATION PRESSURIZED (MISCELLANEOUS) ×2
SHEATH PROBE COVER 6X72 (BAG) ×2 IMPLANT
STATION PROTECTION PRESSURIZED (MISCELLANEOUS) ×1 IMPLANT
STOPCOCK MORSE 400PSI 3WAY (MISCELLANEOUS) ×2 IMPLANT
TRAY PV CATH (CUSTOM PROCEDURE TRAY) ×2 IMPLANT
TUBING CIL FLEX 10 FLL-RA (TUBING) ×2 IMPLANT

## 2020-07-25 NOTE — Progress Notes (Signed)
Discharge instructions reviewed with pt and his wife (via telephone) both voice understanding.  

## 2020-07-25 NOTE — Progress Notes (Addendum)
Called wife to inform of patient surgery on Monday. Per wife, patient says he felt everything during fistulogram today Advised them to tell anesthesia that prior to surgery on Monday morning. Verbalizes understanding.   Wife called to ask about bandage. Advised her to leave it on for 24 hours then could remove and wash with mild soap and water and pat dry. Verbalizes understanding.

## 2020-07-25 NOTE — Discharge Instructions (Signed)
Dialysis Fistulogram, Care After The following information offers guidance on how to care for yourself after your procedure. Your health care provider may also give you more specific instructions. If you have problems or questions, contact your health care provider. What can I expect after the procedure? After the procedure, it is common to have:  A small amount of discomfort in the area where the small tube (catheter) was placed for the procedure.  A small amount of bruising around the fistula.  Sleepiness and tiredness (fatigue). Follow these instructions at home: Puncture site care  Follow instructions from your health care provider about how to take care of the site where catheters were inserted. Make sure you: ? Wash your hands with soap and water for at least 20 seconds before and after you change your bandage (dressing). If soap and water are not available, use hand sanitizer. ? Change your dressing as told by your health care provider. ? Leave stitches (sutures), skin glue, or adhesive strips in place. These skin closures may need to stay in place for 2 weeks or longer. If adhesive strip edges start to loosen and curl up, you may trim the loose edges. Do not remove adhesive strips completely unless your health care provider tells you to do that.  Check your puncture area every day for signs of infection. Check for: ? More redness, swelling, or pain. ? Fluid or blood. ? Warmth. ? Pus or a bad smell.   Activity  Rest as much as you can.  If you were given a sedative during the procedure, it can affect you for several hours. Do not drive or operate machinery until your health care provider says that it is safe.  Do not lift anything that is heavier than 5 lb (2.3 kg), or the limit that you are told, on the day of your procedure.  Do not do anything strenuous with your arm for the rest of the day. Avoid household activities, such as vacuuming.  Return to your normal activities as  told by your health care provider. Ask your health care provider what activities are safe for you. Safety To prevent damage to your graft or fistula:  Do not wear tight-fitting clothing or jewelry on the arm or leg that has your graft or fistula.  Tell all your health care providers that you have a dialysis fistula or graft.  Do not allow blood draws, IVs, or blood pressure readings to be done in the arm that has your fistula or graft.  Do not allow flu shots or vaccinations in the arm with your fistula or graft. General instructions  Take over-the-counter and prescription medicines only as told by your health care provider.  Do not take baths, swim, or use a hot tub until your health care provider approves. Ask your health care provider if you may take showers. You may only be allowed to take sponge baths.  Monitor your dialysis fistula closely. Check to make sure that you can feel a vibration or buzz (a thrill) when you put your fingers over the fistula.  Keep all follow-up visits. This is important. Contact a health care provider if:  You have more redness, swelling, or pain at the site where the catheter was put in.  You have fluid or blood coming from the catheter site.  You have pus or a bad smell coming from the catheter site.  Your catheter site feels warm.  You have a fever or chills. Get help right away if:    You have bleeding from the vascular access site that does not stop.  You feel weak.  You have trouble balancing.  You have trouble moving your arms or legs.  You have problems with your speech or vision.  You can no longer feel a vibration or buzz when you put your fingers over your fistula.  The limb that was used for the procedure swells or becomes painful, cold, blue, or pale white.  You have chest pain or shortness of breath. These symptoms may represent a serious problem that is an emergency. Do not wait to see if the symptoms will go away. Get  medical help right away. Call your local emergency services (911 in the [place] Do not drive yourself to the hospital. Summary  After a dialysis fistulogram, it is common to have a small amount of discomfort or bruising in the area where the small, thin tube (catheter) was placed.  Rest as much as you can after your procedure. Return to your normal activities as told by your health care provider.  Take over-the-counter and prescription medicines only as told by your health care provider.  Follow instructions from your health care provider about how to take care of the site where the catheter was inserted.  Keep all follow-up visits. This is important. This information is not intended to replace advice given to you by your health care provider. Make sure you discuss any questions you have with your health care provider. Document Revised: 11/21/2019 Document Reviewed: 11/21/2019 Elsevier Patient Education  2021 Elsevier Inc.  

## 2020-07-25 NOTE — Interval H&P Note (Signed)
History and Physical Interval Note:  07/25/2020 8:22 AM  Jeffrey Payne  has presented today for surgery, with the diagnosis of instage renal.  The various methods of treatment have been discussed with the patient and family. After consideration of risks, benefits and other options for treatment, the patient has consented to  Procedure(s): A/V FISTULAGRAM - Left Arm (N/A) as a surgical intervention.  The patient's history has been reviewed, patient examined, no change in status, stable for surgery.  I have reviewed the patient's chart and labs.  Questions were answered to the patient's satisfaction.     Ruta Hinds

## 2020-07-25 NOTE — Progress Notes (Signed)
Mr. Ladnier asked that I speak to his wife, Phoenyx Paulsen, patient  is hard of hearing .Mr. Walthall denies chest pain or shortness of breath at rest, Mrs. Cogbill has noted some increase in Shortness- possible fluid. Patient was tested for Covid and has been in quarantine since that time , except when he can to the hospital for fistulagram today, protocal was followed. Patient will remain in quarantine with his wife until after procedure on Monday.

## 2020-07-25 NOTE — Op Note (Signed)
Procedure: Left arm fistulogram  Preoperative diagnosis: Poorly functioning AV fistula left arm  Postoperative diagnosis: Same  Anesthesia: Local  Operative findings: Diffusely diseased distal left cephalic vein with multiple collaterals possible axillary vein occlusion and 70% narrowing of cephalic arch left arm  Operative details: After team informed consent, the patient was taken the [facility] lab.  The patient was placed in supine position angio table.  Left upper extremities prepped and draped in usual sterile fashion.  Ultrasound was used to identify the patient's left arm AV fistula.  A micropuncture needle was used to cannulate the fistula after infiltration of local anesthesia.  Micropuncture wire was advanced into the fistula without difficulty.  Micropuncture sheath was then placed over this.  Contrast angiogram was obtained which shows the central veins are patent but there is some narrowing of the cephalic arch about 69%.  The axillary vein fills in portions but does not fill completely.  There is no retrograde filling of this.  There are abundant collaterals over the left shoulder.  Next as we move down to take images of the more proximal fistula the proximal fistula is patent but in the distal third of the upper arm the vein abruptly branches into a large number of collaterals probably 5 or 6 of these.  There is no true cephalic vein that empties into the cephalic arch.  The remainder of the fistula proximally is patent.  Millimeter refluxed contrast across the arterial anastomosis was patent as well.  At this point the procedure was concluded.  The micropuncture sheath was removed and the hole repaired with a single figure-of-eight Monocryl stitch.  The patient tolerated procedure well and there were no complications.  Patient was taken to the holding area in stable condition.  Operative management: The patient will be scheduled for a right upper arm graft on Monday July 28, 2020.  I spoke  with Dr. Arty Baumgartner by phone today.  He stated that we would not need a tunneled dialysis catheter at the same time.  Ruta Hinds, MD [facility] [place] Office: [phone]

## 2020-07-28 ENCOUNTER — Encounter (HOSPITAL_COMMUNITY): Payer: Self-pay | Admitting: Vascular Surgery

## 2020-07-28 ENCOUNTER — Ambulatory Visit (HOSPITAL_COMMUNITY): Payer: Medicare Other | Admitting: Anesthesiology

## 2020-07-28 ENCOUNTER — Other Ambulatory Visit: Payer: Self-pay

## 2020-07-28 ENCOUNTER — Encounter (HOSPITAL_COMMUNITY)
Admission: RE | Disposition: A | Discharge status: Home / Self Care | Payer: Self-pay | Source: Home / Self Care | Attending: Vascular Surgery

## 2020-07-28 ENCOUNTER — Ambulatory Visit (HOSPITAL_COMMUNITY)
Admission: RE | Admit: 2020-07-28 | Discharge: 2020-07-28 | Disposition: A | Discharge status: Home / Self Care | Payer: Medicare Other | Attending: Vascular Surgery | Admitting: Vascular Surgery

## 2020-07-28 DIAGNOSIS — I120 Hypertensive chronic kidney disease with stage 5 chronic kidney disease or end stage renal disease: Secondary | ICD-10-CM | POA: Insufficient documentation

## 2020-07-28 DIAGNOSIS — Z79899 Other long term (current) drug therapy: Secondary | ICD-10-CM | POA: Diagnosis not present

## 2020-07-28 DIAGNOSIS — N184 Chronic kidney disease, stage 4 (severe): Secondary | ICD-10-CM

## 2020-07-28 DIAGNOSIS — D631 Anemia in chronic kidney disease: Secondary | ICD-10-CM | POA: Diagnosis not present

## 2020-07-28 DIAGNOSIS — N179 Acute kidney failure, unspecified: Secondary | ICD-10-CM | POA: Diagnosis not present

## 2020-07-28 DIAGNOSIS — N186 End stage renal disease: Secondary | ICD-10-CM | POA: Insufficient documentation

## 2020-07-28 HISTORY — PX: AV FISTULA PLACEMENT: SHX1204

## 2020-07-28 HISTORY — DX: Anemia, unspecified: D64.9

## 2020-07-28 HISTORY — DX: Dyspnea, unspecified: R06.00

## 2020-07-28 LAB — POCT I-STAT, CHEM 8
BUN: 58 mg/dL — ABNORMAL HIGH (ref 8–23)
Calcium, Ion: 1.31 mmol/L (ref 1.15–1.40)
Chloride: 111 mmol/L (ref 98–111)
Creatinine, Ser: 3.7 mg/dL — ABNORMAL HIGH (ref 0.61–1.24)
Glucose, Bld: 104 mg/dL — ABNORMAL HIGH (ref 70–99)
HCT: 36 % — ABNORMAL LOW (ref 39.0–52.0)
Hemoglobin: 12.2 g/dL — ABNORMAL LOW (ref 13.0–17.0)
Potassium: 3.8 mmol/L (ref 3.5–5.1)
Sodium: 141 mmol/L (ref 135–145)
TCO2: 20 mmol/L — ABNORMAL LOW (ref 22–32)

## 2020-07-28 SURGERY — INSERTION OF ARTERIOVENOUS (AV) GORE-TEX GRAFT ARM
Anesthesia: General | Site: Arm Lower | Laterality: Right

## 2020-07-28 MED ORDER — ONDANSETRON HCL 4 MG/2ML IJ SOLN
INTRAMUSCULAR | Status: DC | PRN
  Administered 2020-07-28: 4 mg via INTRAVENOUS

## 2020-07-28 MED ORDER — CHLORHEXIDINE GLUCONATE 0.12 % MT SOLN
15.0000 mL | Freq: Once | OROMUCOSAL | Status: AC
Start: 2020-07-28 — End: 2020-07-28
  Administered 2020-07-28: 15 mL via OROMUCOSAL
  Filled 2020-07-28: qty 15

## 2020-07-28 MED ORDER — LIDOCAINE 2% (20 MG/ML) 5 ML SYRINGE
INTRAMUSCULAR | Status: DC | PRN
  Administered 2020-07-28: 20 mg via INTRAVENOUS

## 2020-07-28 MED ORDER — CHLORHEXIDINE GLUCONATE 4 % EX LIQD: 60.0000 mL | Freq: Once | CUTANEOUS | Status: DC

## 2020-07-28 MED ORDER — DEXAMETHASONE SODIUM PHOSPHATE 10 MG/ML IJ SOLN
INTRAMUSCULAR | Status: DC | PRN
  Administered 2020-07-28: 4 mg via INTRAVENOUS

## 2020-07-28 MED ORDER — FENTANYL CITRATE (PF) 100 MCG/2ML IJ SOLN
INTRAMUSCULAR | Status: AC
  Administered 2020-07-28: 25 ug via INTRAVENOUS
  Filled 2020-07-28: qty 2

## 2020-07-28 MED ORDER — FENTANYL CITRATE (PF) 250 MCG/5ML IJ SOLN
INTRAMUSCULAR | Status: DC | PRN
  Administered 2020-07-28: 50 ug via INTRAVENOUS
  Administered 2020-07-28: 25 ug via INTRAVENOUS

## 2020-07-28 MED ORDER — PROPOFOL 10 MG/ML IV BOLUS
INTRAVENOUS | Status: DC | PRN
  Administered 2020-07-28: 20 mg via INTRAVENOUS
  Administered 2020-07-28: 100 mg via INTRAVENOUS

## 2020-07-28 MED ORDER — PROPOFOL 10 MG/ML IV BOLUS
INTRAVENOUS | Status: AC
  Filled 2020-07-28: qty 20

## 2020-07-28 MED ORDER — SODIUM CHLORIDE 0.9 % IV SOLN
INTRAVENOUS | Status: DC | PRN
  Administered 2020-07-28: 500 mL

## 2020-07-28 MED ORDER — PHENYLEPHRINE HCL-NACL 10-0.9 MG/250ML-% IV SOLN
INTRAVENOUS | Status: DC | PRN
  Administered 2020-07-28: 25 ug/min via INTRAVENOUS

## 2020-07-28 MED ORDER — PHENYLEPHRINE 40 MCG/ML (10ML) SYRINGE FOR IV PUSH (FOR BLOOD PRESSURE SUPPORT)
PREFILLED_SYRINGE | INTRAVENOUS | Status: AC
  Filled 2020-07-28: qty 10

## 2020-07-28 MED ORDER — CEFAZOLIN SODIUM-DEXTROSE 2-4 GM/100ML-% IV SOLN
2.0000 g | INTRAVENOUS | Status: AC
  Administered 2020-07-28: 2 g via INTRAVENOUS
  Filled 2020-07-28: qty 100

## 2020-07-28 MED ORDER — LIDOCAINE HCL (PF) 1 % IJ SOLN
INTRAMUSCULAR | Status: AC
  Filled 2020-07-28: qty 30

## 2020-07-28 MED ORDER — FENTANYL CITRATE (PF) 100 MCG/2ML IJ SOLN
25.0000 ug | INTRAMUSCULAR | Status: DC | PRN
  Administered 2020-07-28: 25 ug via INTRAVENOUS

## 2020-07-28 MED ORDER — DEXAMETHASONE SODIUM PHOSPHATE 10 MG/ML IJ SOLN
INTRAMUSCULAR | Status: AC
  Filled 2020-07-28: qty 1

## 2020-07-28 MED ORDER — ORAL CARE MOUTH RINSE
15.0000 mL | Freq: Once | OROMUCOSAL | Status: AC
Start: 2020-07-28 — End: 2020-07-28

## 2020-07-28 MED ORDER — HEPARIN SODIUM (PORCINE) 1000 UNIT/ML IJ SOLN
INTRAMUSCULAR | Status: DC | PRN
  Administered 2020-07-28: 5000 [IU] via INTRAVENOUS

## 2020-07-28 MED ORDER — SODIUM CHLORIDE 0.9 % IV SOLN: INTRAVENOUS | Status: DC

## 2020-07-28 MED ORDER — FENTANYL CITRATE (PF) 250 MCG/5ML IJ SOLN
INTRAMUSCULAR | Status: AC
  Filled 2020-07-28: qty 5

## 2020-07-28 MED ORDER — LIDOCAINE 2% (20 MG/ML) 5 ML SYRINGE
INTRAMUSCULAR | Status: AC
  Filled 2020-07-28: qty 5

## 2020-07-28 MED ORDER — ONDANSETRON HCL 4 MG/2ML IJ SOLN
INTRAMUSCULAR | Status: AC
  Filled 2020-07-28: qty 2

## 2020-07-28 MED ORDER — OXYCODONE-ACETAMINOPHEN 5-325 MG PO TABS: 1.0000 | ORAL_TABLET | ORAL | 0 refills | Status: DC | PRN

## 2020-07-28 MED ORDER — 0.9 % SODIUM CHLORIDE (POUR BTL) OPTIME
TOPICAL | Status: DC | PRN
  Administered 2020-07-28: 1000 mL

## 2020-07-28 MED ORDER — LIDOCAINE HCL 1 % IJ SOLN
INTRAMUSCULAR | Status: AC
  Filled 2020-07-28: qty 20

## 2020-07-28 MED ORDER — EPHEDRINE 5 MG/ML INJ
INTRAVENOUS | Status: AC
  Filled 2020-07-28: qty 10

## 2020-07-28 MED ORDER — EPHEDRINE SULFATE-NACL 50-0.9 MG/10ML-% IV SOSY
PREFILLED_SYRINGE | INTRAVENOUS | Status: DC | PRN
  Administered 2020-07-28 (×3): 10 mg via INTRAVENOUS

## 2020-07-28 SURGICAL SUPPLY — 61 items
ADH SKN CLS APL DERMABOND .7 (GAUZE/BANDAGES/DRESSINGS) ×2
AGENT HMST SPONGE THK3/8 (HEMOSTASIS)
APL PRP STRL LF DISP 70% ISPRP (MISCELLANEOUS)
ARMBAND PINK RESTRICT EXTREMIT (MISCELLANEOUS) ×6 IMPLANT
BAG DECANTER FOR FLEXI CONT (MISCELLANEOUS) ×3 IMPLANT
BIOPATCH RED 1 DISK 7.0 (GAUZE/BANDAGES/DRESSINGS) IMPLANT
BNDG CONFORM 3 STRL LF (GAUZE/BANDAGES/DRESSINGS) ×3 IMPLANT
CANISTER SUCT 3000ML PPV (MISCELLANEOUS) ×3 IMPLANT
CANNULA VESSEL 3MM 2 BLNT TIP (CANNULA) ×3 IMPLANT
CATH PALINDROME-P 19CM W/VT (CATHETERS) IMPLANT
CATH PALINDROME-P 23CM W/VT (CATHETERS) IMPLANT
CATH PALINDROME-P 28CM W/VT (CATHETERS) IMPLANT
CATH STRAIGHT 5FR 65CM (CATHETERS) IMPLANT
CHLORAPREP W/TINT 26 (MISCELLANEOUS) IMPLANT
CLIP VESOCCLUDE MED 6/CT (CLIP) ×3 IMPLANT
CLIP VESOCCLUDE SM WIDE 6/CT (CLIP) ×3 IMPLANT
COVER PROBE W GEL 5X96 (DRAPES) IMPLANT
COVER SURGICAL LIGHT HANDLE (MISCELLANEOUS) ×3 IMPLANT
COVER WAND RF STERILE (DRAPES) IMPLANT
DECANTER SPIKE VIAL GLASS SM (MISCELLANEOUS) ×3 IMPLANT
DERMABOND ADVANCED (GAUZE/BANDAGES/DRESSINGS) ×1
DERMABOND ADVANCED .7 DNX12 (GAUZE/BANDAGES/DRESSINGS) ×2 IMPLANT
DRAIN PENROSE 1/4X12 LTX STRL (WOUND CARE) ×3 IMPLANT
DRAPE C-ARM 42X72 X-RAY (DRAPES) IMPLANT
DRAPE CHEST BREAST 15X10 FENES (DRAPES) IMPLANT
DRSG EMULSION OIL 3X3 NADH (GAUZE/BANDAGES/DRESSINGS) ×3 IMPLANT
ELECT REM PT RETURN 9FT ADLT (ELECTROSURGICAL) ×3
ELECTRODE REM PT RTRN 9FT ADLT (ELECTROSURGICAL) ×2 IMPLANT
GAUZE 4X4 16PLY RFD (DISPOSABLE) IMPLANT
GAUZE SPONGE 2X2 8PLY STRL LF (GAUZE/BANDAGES/DRESSINGS) ×2 IMPLANT
GLOVE BIO SURGEON STRL SZ7.5 (GLOVE) ×3 IMPLANT
GOWN STRL REUS W/ TWL LRG LVL3 (GOWN DISPOSABLE) ×6 IMPLANT
GOWN STRL REUS W/TWL LRG LVL3 (GOWN DISPOSABLE) ×9
HEMOSTAT SPONGE AVITENE ULTRA (HEMOSTASIS) IMPLANT
KIT BASIN OR (CUSTOM PROCEDURE TRAY) ×3 IMPLANT
KIT PALINDROME-P 55CM (CATHETERS) IMPLANT
KIT TURNOVER KIT B (KITS) ×3 IMPLANT
NEEDLE 18GX1X1/2 (RX/OR ONLY) (NEEDLE) IMPLANT
NEEDLE HYPO 25GX1X1/2 BEV (NEEDLE) ×3 IMPLANT
NS IRRIG 1000ML POUR BTL (IV SOLUTION) ×3 IMPLANT
PACK CV ACCESS (CUSTOM PROCEDURE TRAY) ×3 IMPLANT
PACK SURGICAL SETUP 50X90 (CUSTOM PROCEDURE TRAY) IMPLANT
PAD ARMBOARD 7.5X6 YLW CONV (MISCELLANEOUS) ×6 IMPLANT
SET MICROPUNCTURE 5F STIFF (MISCELLANEOUS) IMPLANT
SPONGE GAUZE 2X2 STER 10/PKG (GAUZE/BANDAGES/DRESSINGS) ×1
SUT ETHILON 3 0 PS 1 (SUTURE) ×9 IMPLANT
SUT PROLENE 6 0 CC (SUTURE) ×6 IMPLANT
SUT PROLENE 7 0 BV 1 (SUTURE) ×3 IMPLANT
SUT VIC AB 3-0 SH 27 (SUTURE) ×6
SUT VIC AB 3-0 SH 27X BRD (SUTURE) ×4 IMPLANT
SUT VICRYL 4-0 PS2 18IN ABS (SUTURE) ×6 IMPLANT
SYR 10ML LL (SYRINGE) ×3 IMPLANT
SYR 20ML LL LF (SYRINGE) IMPLANT
SYR 5ML LL (SYRINGE) ×3 IMPLANT
SYR CONTROL 10ML LL (SYRINGE) ×3 IMPLANT
SYR TOOMEY 50ML (SYRINGE) IMPLANT
TOWEL GREEN STERILE (TOWEL DISPOSABLE) ×6 IMPLANT
TOWEL GREEN STERILE FF (TOWEL DISPOSABLE) ×3 IMPLANT
UNDERPAD 30X36 HEAVY ABSORB (UNDERPADS AND DIAPERS) ×3 IMPLANT
WATER STERILE IRR 1000ML POUR (IV SOLUTION) ×3 IMPLANT
WIRE AMPLATZ SS-J .035X180CM (WIRE) IMPLANT

## 2020-07-28 NOTE — Op Note (Signed)
Procedure: Right radiocephalic AV fistula  Preoperative diagnosis: CKD4  Postoperative diagnosis: Same  Anesthesia: General  Assistant: Kristeen Mans, RNFA  Operative findings: 4 to 6 mm cephalic vein  Operative details: After obtaining informed consent, the patient was taken to the operating room.  The patient was placed in supine position on the operating table.  After induction of general anesthesia the patient's right upper extremity was prepped and draped in usual sterile fashion.  Ultrasound was used to identify the patient's right cephalic vein with a tourniquet in place.  With a tourniquet in place the cephalic vein measured 6 mm at the wrist tapering down to 4 mm at the antecubital fossa.  I thought this would be adequate vein to create a fistula.  Next longitudinal incision was made midway between the radial artery and cephalic vein.  Incision was carried down through the subcutaneous tissues down the level of the vein.  The vein was of good quality.  It was dissected free circumferentially and small side branches ligated divided tween silk ties.  Next the radial artery was dissected free in the medial portion incision.  This was also of good quality about 3 mm in diameter.  Vesseloops were placed proximal distal to the planned site of arteriotomy.  The patient was given 5000 units of heparin.  Distal cephalic vein was ligated with a 2-0 silk tie transected and swung over the level of the artery.  It was thoroughly flushed with heparinized saline.  It was controlled proximally with a fine bulldog clamp.  It was marked for orientation.  Vesseloops were used to control the artery proximally and distally.  Longitudinal opening was made in the radial artery and the vein was sewn end of vein to side of artery using a running 6-0 Prolene suture.  Just prior to completion of anastomosis it was for blood backbled and thoroughly flushed anastomosis was secured clamps were released there is palpable  thrill in the fistula immediately.  Hemostasis was obtained with and additional repair stitch on the medial aspect of the anastomosis.  The patient had very thin fragile skin with minimal subcutaneous tissue.  Therefore the incision was closed with interrupted 3-0 nylon sutures.  Dry sterile dressing was applied.  Patient tolerated the procedure well and there were no complications.  The instrument sponge and needle counts were correct at the end of the case.  Patient was taken the recovery in stable condition.  Ruta Hinds, MD [facility] [place] Office: [phone]

## 2020-07-28 NOTE — Anesthesia Procedure Notes (Signed)
Procedure Name: LMA Insertion Date/Time: 07/28/2020 9:27 AM Performed by: Renato Shin, CRNA Pre-anesthesia Checklist: Patient identified, Emergency Drugs available, Suction available and Patient being monitored Patient Re-evaluated:Patient Re-evaluated prior to induction Oxygen Delivery Method: Circle system utilized Preoxygenation: Pre-oxygenation with 100% oxygen Induction Type: IV induction Ventilation: Mask ventilation without difficulty LMA: LMA inserted LMA Size: 4.0 Number of attempts: 1 Placement Confirmation: positive ETCO2 and breath sounds checked- equal and bilateral Tube secured with: Tape Dental Injury: Teeth and Oropharynx as per pre-operative assessment

## 2020-07-28 NOTE — Interval H&P Note (Signed)
History and Physical Interval Note:  07/28/2020 9:16 AM  Jeffrey Payne  has presented today for surgery, with the diagnosis of ESRD.  The various methods of treatment have been discussed with the patient and family. After consideration of risks, benefits and other options for treatment, the patient has consented to  Procedure(s): INSERTION OF ARTERIOVENOUS (AV) GORE-TEX GRAFT RIGHT UPPER ARM (Right) POSSIBLE INSERTION OF TUNNELED DIALYSIS CATHETER (N/A) as a surgical intervention.  The patient's history has been reviewed, patient examined, no change in status, stable for surgery.  I have reviewed the patient's chart and labs.  Questions were answered to the patient's satisfaction.     Ruta Hinds

## 2020-07-28 NOTE — Anesthesia Postprocedure Evaluation (Signed)
Anesthesia Post Note  Patient: Jeffrey Payne  Procedure(s) Performed: RIGHT  ARM ARTERIOVENOUS FISTULA CREATION (Right Arm Lower)     Patient location during evaluation: PACU Anesthesia Type: General Level of consciousness: awake and alert, patient cooperative and oriented Pain management: pain level controlled Vital Signs Assessment: post-procedure vital signs reviewed and stable Respiratory status: spontaneous breathing, nonlabored ventilation and respiratory function stable Cardiovascular status: blood pressure returned to baseline and stable Postop Assessment: no apparent nausea or vomiting Anesthetic complications: no   No complications documented.  Last Vitals:  Vitals:   07/28/20 1106 07/28/20 1121  BP: (!) 131/48 (!) 128/44  Pulse: 63 60  Resp: 13 12  Temp:    SpO2: 95% 90%    Last Pain:  Vitals:   07/28/20 1121  TempSrc:   PainSc: Asleep                 Sladen Plancarte,E. Passion Lavin

## 2020-07-28 NOTE — Transfer of Care (Signed)
Immediate Anesthesia Transfer of Care Note  Patient: Jeffrey Payne  Procedure(s) Performed: RIGHT  ARM ARTERIOVENOUS FISTULA CREATION (Right Arm Lower)  Patient Location: PACU  Anesthesia Type:General  Level of Consciousness: awake and patient cooperative  Airway & Oxygen Therapy: Patient Spontanous Breathing and Patient connected to nasal cannula oxygen  Post-op Assessment: Report given to RN and Post -op Vital signs reviewed and stable  Post vital signs: Reviewed and stable  Last Vitals:  Vitals Value Taken Time  BP 128/44 07/28/20 1121  Temp 36.4 C 07/28/20 1051  Pulse 62 07/28/20 1124  Resp 12 07/28/20 1124  SpO2 94 % 07/28/20 1124  Vitals shown include unvalidated device data.  Last Pain:  Vitals:   07/28/20 1106  TempSrc:   PainSc: 2          Complications: No complications documented.

## 2020-07-28 NOTE — Discharge Instructions (Signed)
[facility]  Discharge Instructions  AV Fistula or Graft Surgery for Dialysis Access  Please refer to the following instructions for your post-procedure care. Your surgeon or physician assistant will discuss any changes with you.  Activity  You may drive the day following your surgery, if you are comfortable and no longer taking prescription pain medication. Resume full activity as the soreness in your incision resolves.  Bathing/Showering  You may shower after you go home. Keep your incision dry for 48 hours. Do not soak in a bathtub, hot tub, or swim until the incision heals completely. You may not shower if you have a hemodialysis catheter.  Incision Care  Clean your incision with mild soap and water after 48 hours. Pat the area dry with a clean towel. You do not need a bandage unless otherwise instructed. Do not apply any ointments or creams to your incision. You may have skin glue on your incision. Do not peel it off. It will come off on its own in about one week. Your arm may swell a bit after surgery. To reduce swelling use pillows to elevate your arm so it is above your heart. Your doctor will tell you if you need to lightly wrap your arm with an ACE bandage.  Diet  Resume your normal diet. There are not special food restrictions following this procedure. In order to heal from your surgery, it is CRITICAL to get adequate nutrition. Your body requires vitamins, minerals, and protein. Vegetables are the best source of vitamins and minerals. Vegetables also provide the perfect balance of protein. Processed food has little nutritional value, so try to avoid this.  Medications  Resume taking all of your medications. If your incision is causing pain, you may take over-the counter pain relievers such as acetaminophen (Tylenol). If you were prescribed a stronger pain medication, please be aware these medications can cause nausea and constipation. Prevent  nausea by taking the medication with a snack or meal. Avoid constipation by drinking plenty of fluids and eating foods with high amount of fiber, such as fruits, vegetables, and grains.  Do not take Tylenol if you are taking prescription pain medications.  Follow up Your surgeon may want to see you in the office following your access surgery. If so, this will be arranged at the time of your surgery.  Please call us immediately for any of the following conditions:  . Increased pain, redness, drainage (pus) from your incision site . Fever of 101 degrees or higher . Severe or worsening pain at your incision site . Hand pain or numbness. .  Reduce your risk of vascular disease:  . Stop smoking. If you would like help, call [facility] at 1-800-QUIT-NOW (501) 455-5839) or [place] at 920-059-7093  . Manage your cholesterol . Maintain a desired weight . Control your diabetes . Keep your blood pressure down  Dialysis  It will take several weeks to several months for your new dialysis access to be ready for use. Your surgeon will determine when it is okay to use it. Your nephrologist will continue to direct your dialysis. You can continue to use your Permcath until your new access is ready for use.   07/28/2020 Jeffrey Payne [id number] 02-23-1933  Surgeon(s): Elam Dutch, MD  Procedure(s): RIGHT  ARM ARTERIOVENOUS FISTULA CREATION   May stick graft immediately   May stick graft on designated area only:   X Do not stick AV fistula for 12 weeks    If  you have any questions, please call the office at [phone].

## 2020-07-28 NOTE — Anesthesia Preprocedure Evaluation (Addendum)
Anesthesia Evaluation  Patient identified by MRN, date of birth, ID band Patient awake    Reviewed: Allergy & Precautions, NPO status , Patient's Chart, lab work & pertinent test results, reviewed documented beta blocker date and time   History of Anesthesia Complications Negative for: history of anesthetic complications  Airway Mallampati: II  TM Distance: >3 FB Neck ROM: Full    Dental  (+) Teeth Intact, Dental Advisory Given   Pulmonary neg pulmonary ROS,  07/24/2020 SARS Coronavirus NEG   breath sounds clear to auscultation       Cardiovascular hypertension, Pt. on medications and Pt. on home beta blockers (-) angina Rhythm:Regular Rate:Normal  '21 ECHO: EF 50-55%, low normal function, but no regional abnormalities, normal RVF, mild AI   Neuro/Psych negative neurological ROS     GI/Hepatic Neg liver ROS, GERD  Controlled,  Endo/Other  negative endocrine ROS  Renal/GU ESRFRenal disease (K+ 3.9)   Prostate cancer    Musculoskeletal  (+) Arthritis ,   Abdominal   Peds  Hematology negative hematology ROS (+)   Anesthesia Other Findings   Reproductive/Obstetrics                            Anesthesia Physical Anesthesia Plan  ASA: III  Anesthesia Plan: General   Post-op Pain Management:    Induction: Intravenous  PONV Risk Score and Plan: 2 and Ondansetron and Dexamethasone  Airway Management Planned: LMA  Additional Equipment: None  Intra-op Plan:   Post-operative Plan:   Informed Consent: I have reviewed the patients History and Physical, chart, labs and discussed the procedure including the risks, benefits and alternatives for the proposed anesthesia with the patient or authorized representative who has indicated his/her understanding and acceptance.     Dental advisory given  Plan Discussed with: CRNA and Surgeon  Anesthesia Plan Comments:        Anesthesia  Quick Evaluation

## 2020-07-29 ENCOUNTER — Encounter (HOSPITAL_COMMUNITY): Payer: Self-pay | Admitting: Vascular Surgery

## 2020-07-31 DIAGNOSIS — I770 Arteriovenous fistula, acquired: Secondary | ICD-10-CM | POA: Diagnosis not present

## 2020-07-31 DIAGNOSIS — N281 Cyst of kidney, acquired: Secondary | ICD-10-CM | POA: Diagnosis not present

## 2020-07-31 DIAGNOSIS — N185 Chronic kidney disease, stage 5: Secondary | ICD-10-CM | POA: Diagnosis not present

## 2020-07-31 DIAGNOSIS — R809 Proteinuria, unspecified: Secondary | ICD-10-CM | POA: Diagnosis not present

## 2020-07-31 DIAGNOSIS — I120 Hypertensive chronic kidney disease with stage 5 chronic kidney disease or end stage renal disease: Secondary | ICD-10-CM | POA: Diagnosis not present

## 2020-07-31 DIAGNOSIS — D631 Anemia in chronic kidney disease: Secondary | ICD-10-CM | POA: Diagnosis not present

## 2020-07-31 DIAGNOSIS — N2581 Secondary hyperparathyroidism of renal origin: Secondary | ICD-10-CM | POA: Diagnosis not present

## 2020-07-31 DIAGNOSIS — R339 Retention of urine, unspecified: Secondary | ICD-10-CM | POA: Diagnosis not present

## 2020-08-14 ENCOUNTER — Other Ambulatory Visit: Payer: Self-pay

## 2020-08-14 ENCOUNTER — Ambulatory Visit (INDEPENDENT_AMBULATORY_CARE_PROVIDER_SITE_OTHER): Payer: Medicare Other | Admitting: Vascular Surgery

## 2020-08-14 ENCOUNTER — Encounter: Payer: Self-pay | Admitting: Vascular Surgery

## 2020-08-14 VITALS — BP 151/59 | HR 71 | Temp 98.3°F | Resp 20 | Ht 67.0 in | Wt 134.0 lb

## 2020-08-14 DIAGNOSIS — N184 Chronic kidney disease, stage 4 (severe): Secondary | ICD-10-CM

## 2020-08-14 NOTE — Progress Notes (Signed)
Patient is an 87 year old male who returns for follow-up today.  He had creation of a right radiocephalic AV fistula on July 28, 2020.  He had nylon sutures placed due to very fragile skin.  He reports no numbness or tingling in the right hand.  He is not currently on hemodialysis.  He previously had a left arm AV fistula which failed to mature.  Physical exam:  Vitals:   08/14/20 1105  BP: (!) 151/59  Pulse: 71  Resp: 20  Temp: 98.3 F (36.8 C)  SpO2: 96%  Weight: 134 lb (60.8 kg)  Height: 5\' 7"  (1.702 m)    Right upper extremity: Well-healed wrist incision fistula diameter is already more than 6 mm.  There is an easily palpable thrill.  The fistula is visible on the skin surface from the wrist all the way to the antecubital region.  Left upper extremity pulsatile AV fistula left antecubital region  Assessment: Maturing AV fistula right arm  Plan: Patient will follow up in our APP clinic in 1 month with an ultrasound of his AV fistula.  Hopefully will be ready for cannulation around July 2022  Patient was instructed in how to exercise the fistula today.  Ruta Hinds, MD [facility] [place] Office: [phone]

## 2020-08-22 ENCOUNTER — Other Ambulatory Visit: Payer: Self-pay

## 2020-08-22 DIAGNOSIS — N184 Chronic kidney disease, stage 4 (severe): Secondary | ICD-10-CM

## 2020-08-22 DIAGNOSIS — N185 Chronic kidney disease, stage 5: Secondary | ICD-10-CM | POA: Diagnosis not present

## 2020-09-04 ENCOUNTER — Encounter (HOSPITAL_COMMUNITY): Payer: Medicare Other

## 2020-09-05 DIAGNOSIS — R809 Proteinuria, unspecified: Secondary | ICD-10-CM | POA: Diagnosis not present

## 2020-09-05 DIAGNOSIS — D631 Anemia in chronic kidney disease: Secondary | ICD-10-CM | POA: Diagnosis not present

## 2020-09-05 DIAGNOSIS — I120 Hypertensive chronic kidney disease with stage 5 chronic kidney disease or end stage renal disease: Secondary | ICD-10-CM | POA: Diagnosis not present

## 2020-09-05 DIAGNOSIS — N281 Cyst of kidney, acquired: Secondary | ICD-10-CM | POA: Diagnosis not present

## 2020-09-05 DIAGNOSIS — I770 Arteriovenous fistula, acquired: Secondary | ICD-10-CM | POA: Diagnosis not present

## 2020-09-05 DIAGNOSIS — N2581 Secondary hyperparathyroidism of renal origin: Secondary | ICD-10-CM | POA: Diagnosis not present

## 2020-09-05 DIAGNOSIS — R339 Retention of urine, unspecified: Secondary | ICD-10-CM | POA: Diagnosis not present

## 2020-09-05 DIAGNOSIS — N185 Chronic kidney disease, stage 5: Secondary | ICD-10-CM | POA: Diagnosis not present

## 2020-09-09 DIAGNOSIS — J8410 Pulmonary fibrosis, unspecified: Secondary | ICD-10-CM | POA: Insufficient documentation

## 2020-09-09 DIAGNOSIS — R011 Cardiac murmur, unspecified: Secondary | ICD-10-CM | POA: Insufficient documentation

## 2020-09-09 DIAGNOSIS — N2581 Secondary hyperparathyroidism of renal origin: Secondary | ICD-10-CM | POA: Insufficient documentation

## 2020-09-09 DIAGNOSIS — E785 Hyperlipidemia, unspecified: Secondary | ICD-10-CM | POA: Insufficient documentation

## 2020-09-09 DIAGNOSIS — J301 Allergic rhinitis due to pollen: Secondary | ICD-10-CM | POA: Insufficient documentation

## 2020-09-09 DIAGNOSIS — M1710 Unilateral primary osteoarthritis, unspecified knee: Secondary | ICD-10-CM | POA: Insufficient documentation

## 2020-09-09 DIAGNOSIS — K219 Gastro-esophageal reflux disease without esophagitis: Secondary | ICD-10-CM | POA: Insufficient documentation

## 2020-09-09 DIAGNOSIS — M179 Osteoarthritis of knee, unspecified: Secondary | ICD-10-CM | POA: Insufficient documentation

## 2020-09-11 ENCOUNTER — Other Ambulatory Visit: Payer: Self-pay

## 2020-09-11 ENCOUNTER — Ambulatory Visit (HOSPITAL_COMMUNITY)
Admission: RE | Admit: 2020-09-11 | Discharge: 2020-09-11 | Disposition: A | Discharge status: Home / Self Care | Payer: Medicare Other | Source: Ambulatory Visit | Attending: Vascular Surgery | Admitting: Vascular Surgery

## 2020-09-11 ENCOUNTER — Ambulatory Visit: Payer: Medicare Other | Admitting: Physician Assistant

## 2020-09-11 VITALS — BP 154/48 | HR 59 | Temp 98.1°F | Resp 20 | Ht 67.0 in | Wt 130.4 lb

## 2020-09-11 DIAGNOSIS — N184 Chronic kidney disease, stage 4 (severe): Secondary | ICD-10-CM | POA: Diagnosis not present

## 2020-09-11 NOTE — Progress Notes (Signed)
POST OPERATIVE OFFICE NOTE    CC:  F/u for surgery  HPI:  This is a 87 y.o. male who is s/p right RC AVF on 07/29/2019 by Dr. Oneida Alar for CKD 4.  He was seen a few weeks after surgery to have sutures removed.  He comes in today for dialysis duplex.    Pt states he does not have pain/numbness in the right hand.    The pt is not on dialysis at this time.   He is here with his wife of 73 years.    No Known Allergies  Current Outpatient Medications  Medication Sig Dispense Refill  . acetaminophen (TYLENOL) 650 MG CR tablet Take 650 mg by mouth every 8 (eight) hours as needed for pain.    Marland Kitchen amLODipine (NORVASC) 10 MG tablet Take 10 mg by mouth daily.    . calcitRIOL (ROCALTROL) 0.25 MCG capsule Take 0.25 mcg by mouth daily.     . cetirizine (ZYRTEC) 10 MG tablet Take 10 mg by mouth daily.    Marland Kitchen COVID-19 mRNA vaccine, Pfizer, 30 MCG/0.3ML injection INJECT AS DIRECTED .3 mL 0  . fluticasone (FLONASE) 50 MCG/ACT nasal spray Place 1 spray into the nose daily as needed for allergies.    . furosemide (LASIX) 40 MG tablet Take 1 tablet (40 mg total) by mouth daily. (Patient taking differently: Take 40 mg by mouth 2 (two) times daily as needed for fluid or edema.) 30 tablet 0  . furosemide (LASIX) 40 MG tablet Take 1 tablet (40 mg total) by mouth daily as needed for fluid or edema (take after dinner nightly prn for edema or excess fluid). 30 tablet 0  . Homeopathic Products (ARNICARE ARNICA) CREA Apply 1 application topically daily as needed (arthritis pain).    . hydrALAZINE (APRESOLINE) 25 MG tablet Take 25 mg by mouth 3 (three) times daily.    . metoprolol succinate (TOPROL-XL) 50 MG 24 hr tablet Take 50 mg by mouth at bedtime.   1  . oxyCODONE-acetaminophen (PERCOCET) 5-325 MG tablet Take 1 tablet by mouth every 4 (four) hours as needed for severe pain. 6 tablet 0  . tamsulosin (FLOMAX) 0.4 MG CAPS capsule Take 1 capsule (0.4 mg total) by mouth daily after supper. (Patient taking differently:  Take 0.4 mg by mouth in the morning and at bedtime.) 30 capsule 0   No current facility-administered medications for this visit.     ROS:  See HPI  Physical Exam:  Today's Vitals   09/11/20 1103  BP: (!) 154/48  Pulse: (!) 59  Resp: 20  Temp: 98.1 F (36.7 C)  TempSrc: Temporal  SpO2: 97%  Weight: 130 lb 6.4 oz (59.1 kg)  Height: 5\' 7"  (1.702 m)   Body mass index is 20.42 kg/m.   Incision:  Well healed Extremities:   There is a palpable right radial pulse.   Motor and sensory are in tact.   There is a thrill/bruit present.  The fistula is easily palpable   Dialysis Duplex on 09/11/2020: Findings:  +--------------------+----------+-----------------+-------------+  AVF         PSV (cm/s)Flow Vol (mL/min) Comments    +--------------------+----------+-----------------+-------------+  Native artery inflow  250      368             +--------------------+----------+-----------------+-------------+  AVF Anastomosis     882           valve present  +--------------------+----------+-----------------+-------------+   +------------+----------+-------------+----------+--------------------+  OUTFLOW VEINPSV (cm/s)Diameter (cm)Depth (cm)   Describe   +------------+----------+-------------+----------+--------------------+  AC Fossa         0.50     0.26              +------------+----------+-------------+----------+--------------------+  Prox Forearm  174    0.52     0.25              +------------+----------+-------------+----------+--------------------+  Mid Forearm   129    0.57     0.18  competing branch x 2  +------------+----------+-------------+----------+--------------------+  Dist Forearm  281    0.50     0.20              +------------+----------+-------------+----------+--------------------+    Assessment/Plan:   This is a 87 y.o. male who is s/p: right RC AVF on 07/29/2019 by Dr. Oneida Alar for CKD 4.  -the pt does not have evidence of steal. -the fistula is maturing nicely and is greater than 21mm throughout and aniticipate it will continue to mature.  The fistula can be used starting 10/27/2020. -discussed with pt that these do not last forever and may need intervention or new access in the future.  Also discussed that if they have trouble accessing the fistula, we may need to do an intervention.  They expressed understanding.  -the pt will follow up as needed   Leontine Locket, [facility] [phone]  Clinic MD:  Scot Dock

## 2020-09-16 DIAGNOSIS — M170 Bilateral primary osteoarthritis of knee: Secondary | ICD-10-CM | POA: Diagnosis not present

## 2020-09-16 DIAGNOSIS — N2581 Secondary hyperparathyroidism of renal origin: Secondary | ICD-10-CM | POA: Diagnosis not present

## 2020-09-16 DIAGNOSIS — K219 Gastro-esophageal reflux disease without esophagitis: Secondary | ICD-10-CM | POA: Diagnosis not present

## 2020-09-16 DIAGNOSIS — N184 Chronic kidney disease, stage 4 (severe): Secondary | ICD-10-CM | POA: Diagnosis not present

## 2020-09-16 DIAGNOSIS — E785 Hyperlipidemia, unspecified: Secondary | ICD-10-CM | POA: Diagnosis not present

## 2020-09-16 DIAGNOSIS — E782 Mixed hyperlipidemia: Secondary | ICD-10-CM | POA: Diagnosis not present

## 2020-09-16 DIAGNOSIS — I129 Hypertensive chronic kidney disease with stage 1 through stage 4 chronic kidney disease, or unspecified chronic kidney disease: Secondary | ICD-10-CM | POA: Diagnosis not present

## 2020-10-09 DIAGNOSIS — N185 Chronic kidney disease, stage 5: Secondary | ICD-10-CM | POA: Diagnosis not present

## 2020-10-09 DIAGNOSIS — D472 Monoclonal gammopathy: Secondary | ICD-10-CM | POA: Diagnosis not present

## 2020-10-09 DIAGNOSIS — N2581 Secondary hyperparathyroidism of renal origin: Secondary | ICD-10-CM | POA: Diagnosis not present

## 2020-10-09 DIAGNOSIS — R809 Proteinuria, unspecified: Secondary | ICD-10-CM | POA: Diagnosis not present

## 2020-10-09 DIAGNOSIS — D631 Anemia in chronic kidney disease: Secondary | ICD-10-CM | POA: Diagnosis not present

## 2020-10-09 DIAGNOSIS — I120 Hypertensive chronic kidney disease with stage 5 chronic kidney disease or end stage renal disease: Secondary | ICD-10-CM | POA: Diagnosis not present

## 2020-10-09 DIAGNOSIS — N189 Chronic kidney disease, unspecified: Secondary | ICD-10-CM | POA: Diagnosis not present

## 2020-10-09 DIAGNOSIS — N281 Cyst of kidney, acquired: Secondary | ICD-10-CM | POA: Diagnosis not present

## 2020-11-20 DIAGNOSIS — I120 Hypertensive chronic kidney disease with stage 5 chronic kidney disease or end stage renal disease: Secondary | ICD-10-CM | POA: Diagnosis not present

## 2020-11-20 DIAGNOSIS — D472 Monoclonal gammopathy: Secondary | ICD-10-CM | POA: Diagnosis not present

## 2020-11-20 DIAGNOSIS — R809 Proteinuria, unspecified: Secondary | ICD-10-CM | POA: Diagnosis not present

## 2020-11-20 DIAGNOSIS — N2581 Secondary hyperparathyroidism of renal origin: Secondary | ICD-10-CM | POA: Diagnosis not present

## 2020-11-20 DIAGNOSIS — N189 Chronic kidney disease, unspecified: Secondary | ICD-10-CM | POA: Diagnosis not present

## 2020-11-20 DIAGNOSIS — N281 Cyst of kidney, acquired: Secondary | ICD-10-CM | POA: Diagnosis not present

## 2020-11-20 DIAGNOSIS — N185 Chronic kidney disease, stage 5: Secondary | ICD-10-CM | POA: Diagnosis not present

## 2020-11-20 DIAGNOSIS — D631 Anemia in chronic kidney disease: Secondary | ICD-10-CM | POA: Diagnosis not present

## 2020-11-27 DIAGNOSIS — Z23 Encounter for immunization: Secondary | ICD-10-CM | POA: Diagnosis not present

## 2020-11-27 DIAGNOSIS — E8770 Fluid overload, unspecified: Secondary | ICD-10-CM | POA: Diagnosis not present

## 2020-11-27 DIAGNOSIS — D472 Monoclonal gammopathy: Secondary | ICD-10-CM | POA: Diagnosis not present

## 2020-11-27 DIAGNOSIS — N186 End stage renal disease: Secondary | ICD-10-CM | POA: Diagnosis not present

## 2020-11-27 DIAGNOSIS — I120 Hypertensive chronic kidney disease with stage 5 chronic kidney disease or end stage renal disease: Secondary | ICD-10-CM | POA: Diagnosis not present

## 2020-11-27 DIAGNOSIS — Z992 Dependence on renal dialysis: Secondary | ICD-10-CM | POA: Diagnosis not present

## 2020-11-27 DIAGNOSIS — N2581 Secondary hyperparathyroidism of renal origin: Secondary | ICD-10-CM | POA: Diagnosis not present

## 2020-11-27 DIAGNOSIS — E875 Hyperkalemia: Secondary | ICD-10-CM | POA: Diagnosis not present

## 2020-11-27 DIAGNOSIS — T82399A Other mechanical complication of unspecified vascular grafts, initial encounter: Secondary | ICD-10-CM | POA: Diagnosis not present

## 2020-11-28 DIAGNOSIS — E875 Hyperkalemia: Secondary | ICD-10-CM | POA: Diagnosis not present

## 2020-11-28 DIAGNOSIS — I120 Hypertensive chronic kidney disease with stage 5 chronic kidney disease or end stage renal disease: Secondary | ICD-10-CM | POA: Diagnosis not present

## 2020-11-28 DIAGNOSIS — D472 Monoclonal gammopathy: Secondary | ICD-10-CM | POA: Diagnosis not present

## 2020-11-28 DIAGNOSIS — E8770 Fluid overload, unspecified: Secondary | ICD-10-CM | POA: Diagnosis not present

## 2020-11-28 DIAGNOSIS — Z23 Encounter for immunization: Secondary | ICD-10-CM | POA: Diagnosis not present

## 2020-11-28 DIAGNOSIS — N2581 Secondary hyperparathyroidism of renal origin: Secondary | ICD-10-CM | POA: Diagnosis not present

## 2020-11-28 DIAGNOSIS — T82399A Other mechanical complication of unspecified vascular grafts, initial encounter: Secondary | ICD-10-CM | POA: Diagnosis not present

## 2020-11-28 DIAGNOSIS — N186 End stage renal disease: Secondary | ICD-10-CM | POA: Diagnosis not present

## 2020-11-28 DIAGNOSIS — Z992 Dependence on renal dialysis: Secondary | ICD-10-CM | POA: Diagnosis not present

## 2020-12-01 DIAGNOSIS — T82399A Other mechanical complication of unspecified vascular grafts, initial encounter: Secondary | ICD-10-CM | POA: Diagnosis not present

## 2020-12-01 DIAGNOSIS — E8770 Fluid overload, unspecified: Secondary | ICD-10-CM | POA: Diagnosis not present

## 2020-12-01 DIAGNOSIS — N186 End stage renal disease: Secondary | ICD-10-CM | POA: Diagnosis not present

## 2020-12-01 DIAGNOSIS — Z992 Dependence on renal dialysis: Secondary | ICD-10-CM | POA: Diagnosis not present

## 2020-12-01 DIAGNOSIS — I120 Hypertensive chronic kidney disease with stage 5 chronic kidney disease or end stage renal disease: Secondary | ICD-10-CM | POA: Diagnosis not present

## 2020-12-01 DIAGNOSIS — N2581 Secondary hyperparathyroidism of renal origin: Secondary | ICD-10-CM | POA: Diagnosis not present

## 2020-12-01 DIAGNOSIS — D472 Monoclonal gammopathy: Secondary | ICD-10-CM | POA: Diagnosis not present

## 2020-12-01 DIAGNOSIS — E875 Hyperkalemia: Secondary | ICD-10-CM | POA: Diagnosis not present

## 2020-12-01 DIAGNOSIS — Z23 Encounter for immunization: Secondary | ICD-10-CM | POA: Diagnosis not present

## 2020-12-02 DIAGNOSIS — N186 End stage renal disease: Secondary | ICD-10-CM | POA: Diagnosis not present

## 2020-12-02 DIAGNOSIS — D472 Monoclonal gammopathy: Secondary | ICD-10-CM | POA: Diagnosis not present

## 2020-12-02 DIAGNOSIS — N2581 Secondary hyperparathyroidism of renal origin: Secondary | ICD-10-CM | POA: Diagnosis not present

## 2020-12-02 DIAGNOSIS — Z23 Encounter for immunization: Secondary | ICD-10-CM | POA: Diagnosis not present

## 2020-12-02 DIAGNOSIS — T82399A Other mechanical complication of unspecified vascular grafts, initial encounter: Secondary | ICD-10-CM | POA: Diagnosis not present

## 2020-12-02 DIAGNOSIS — E8770 Fluid overload, unspecified: Secondary | ICD-10-CM | POA: Diagnosis not present

## 2020-12-02 DIAGNOSIS — I120 Hypertensive chronic kidney disease with stage 5 chronic kidney disease or end stage renal disease: Secondary | ICD-10-CM | POA: Diagnosis not present

## 2020-12-02 DIAGNOSIS — E875 Hyperkalemia: Secondary | ICD-10-CM | POA: Diagnosis not present

## 2020-12-02 DIAGNOSIS — Z992 Dependence on renal dialysis: Secondary | ICD-10-CM | POA: Diagnosis not present

## 2020-12-04 DIAGNOSIS — Z23 Encounter for immunization: Secondary | ICD-10-CM | POA: Diagnosis not present

## 2020-12-04 DIAGNOSIS — N186 End stage renal disease: Secondary | ICD-10-CM | POA: Diagnosis not present

## 2020-12-04 DIAGNOSIS — E8770 Fluid overload, unspecified: Secondary | ICD-10-CM | POA: Diagnosis not present

## 2020-12-04 DIAGNOSIS — D472 Monoclonal gammopathy: Secondary | ICD-10-CM | POA: Diagnosis not present

## 2020-12-04 DIAGNOSIS — T82399A Other mechanical complication of unspecified vascular grafts, initial encounter: Secondary | ICD-10-CM | POA: Diagnosis not present

## 2020-12-04 DIAGNOSIS — N2581 Secondary hyperparathyroidism of renal origin: Secondary | ICD-10-CM | POA: Diagnosis not present

## 2020-12-04 DIAGNOSIS — I120 Hypertensive chronic kidney disease with stage 5 chronic kidney disease or end stage renal disease: Secondary | ICD-10-CM | POA: Diagnosis not present

## 2020-12-04 DIAGNOSIS — E875 Hyperkalemia: Secondary | ICD-10-CM | POA: Diagnosis not present

## 2020-12-04 DIAGNOSIS — Z992 Dependence on renal dialysis: Secondary | ICD-10-CM | POA: Diagnosis not present

## 2020-12-05 DIAGNOSIS — Z992 Dependence on renal dialysis: Secondary | ICD-10-CM | POA: Diagnosis not present

## 2020-12-05 DIAGNOSIS — D472 Monoclonal gammopathy: Secondary | ICD-10-CM | POA: Diagnosis not present

## 2020-12-05 DIAGNOSIS — E8770 Fluid overload, unspecified: Secondary | ICD-10-CM | POA: Diagnosis not present

## 2020-12-05 DIAGNOSIS — E875 Hyperkalemia: Secondary | ICD-10-CM | POA: Diagnosis not present

## 2020-12-05 DIAGNOSIS — I120 Hypertensive chronic kidney disease with stage 5 chronic kidney disease or end stage renal disease: Secondary | ICD-10-CM | POA: Diagnosis not present

## 2020-12-05 DIAGNOSIS — N186 End stage renal disease: Secondary | ICD-10-CM | POA: Diagnosis not present

## 2020-12-05 DIAGNOSIS — Z23 Encounter for immunization: Secondary | ICD-10-CM | POA: Diagnosis not present

## 2020-12-05 DIAGNOSIS — N2581 Secondary hyperparathyroidism of renal origin: Secondary | ICD-10-CM | POA: Diagnosis not present

## 2020-12-05 DIAGNOSIS — T82399A Other mechanical complication of unspecified vascular grafts, initial encounter: Secondary | ICD-10-CM | POA: Diagnosis not present

## 2020-12-08 DIAGNOSIS — E8770 Fluid overload, unspecified: Secondary | ICD-10-CM | POA: Diagnosis not present

## 2020-12-08 DIAGNOSIS — Z23 Encounter for immunization: Secondary | ICD-10-CM | POA: Diagnosis not present

## 2020-12-08 DIAGNOSIS — I120 Hypertensive chronic kidney disease with stage 5 chronic kidney disease or end stage renal disease: Secondary | ICD-10-CM | POA: Diagnosis not present

## 2020-12-08 DIAGNOSIS — T82399A Other mechanical complication of unspecified vascular grafts, initial encounter: Secondary | ICD-10-CM | POA: Diagnosis not present

## 2020-12-08 DIAGNOSIS — D472 Monoclonal gammopathy: Secondary | ICD-10-CM | POA: Diagnosis not present

## 2020-12-08 DIAGNOSIS — E875 Hyperkalemia: Secondary | ICD-10-CM | POA: Diagnosis not present

## 2020-12-08 DIAGNOSIS — N2581 Secondary hyperparathyroidism of renal origin: Secondary | ICD-10-CM | POA: Diagnosis not present

## 2020-12-08 DIAGNOSIS — N186 End stage renal disease: Secondary | ICD-10-CM | POA: Diagnosis not present

## 2020-12-08 DIAGNOSIS — Z992 Dependence on renal dialysis: Secondary | ICD-10-CM | POA: Diagnosis not present

## 2020-12-09 DIAGNOSIS — Z23 Encounter for immunization: Secondary | ICD-10-CM | POA: Diagnosis not present

## 2020-12-09 DIAGNOSIS — I120 Hypertensive chronic kidney disease with stage 5 chronic kidney disease or end stage renal disease: Secondary | ICD-10-CM | POA: Diagnosis not present

## 2020-12-09 DIAGNOSIS — D472 Monoclonal gammopathy: Secondary | ICD-10-CM | POA: Diagnosis not present

## 2020-12-09 DIAGNOSIS — T82399A Other mechanical complication of unspecified vascular grafts, initial encounter: Secondary | ICD-10-CM | POA: Diagnosis not present

## 2020-12-09 DIAGNOSIS — E875 Hyperkalemia: Secondary | ICD-10-CM | POA: Diagnosis not present

## 2020-12-09 DIAGNOSIS — N186 End stage renal disease: Secondary | ICD-10-CM | POA: Diagnosis not present

## 2020-12-09 DIAGNOSIS — E8770 Fluid overload, unspecified: Secondary | ICD-10-CM | POA: Diagnosis not present

## 2020-12-09 DIAGNOSIS — Z992 Dependence on renal dialysis: Secondary | ICD-10-CM | POA: Diagnosis not present

## 2020-12-09 DIAGNOSIS — N2581 Secondary hyperparathyroidism of renal origin: Secondary | ICD-10-CM | POA: Diagnosis not present

## 2020-12-11 DIAGNOSIS — D472 Monoclonal gammopathy: Secondary | ICD-10-CM | POA: Diagnosis not present

## 2020-12-11 DIAGNOSIS — T82399A Other mechanical complication of unspecified vascular grafts, initial encounter: Secondary | ICD-10-CM | POA: Diagnosis not present

## 2020-12-11 DIAGNOSIS — Z23 Encounter for immunization: Secondary | ICD-10-CM | POA: Diagnosis not present

## 2020-12-11 DIAGNOSIS — N186 End stage renal disease: Secondary | ICD-10-CM | POA: Diagnosis not present

## 2020-12-11 DIAGNOSIS — E8770 Fluid overload, unspecified: Secondary | ICD-10-CM | POA: Diagnosis not present

## 2020-12-11 DIAGNOSIS — E875 Hyperkalemia: Secondary | ICD-10-CM | POA: Diagnosis not present

## 2020-12-11 DIAGNOSIS — N2581 Secondary hyperparathyroidism of renal origin: Secondary | ICD-10-CM | POA: Diagnosis not present

## 2020-12-11 DIAGNOSIS — I120 Hypertensive chronic kidney disease with stage 5 chronic kidney disease or end stage renal disease: Secondary | ICD-10-CM | POA: Diagnosis not present

## 2020-12-11 DIAGNOSIS — Z992 Dependence on renal dialysis: Secondary | ICD-10-CM | POA: Diagnosis not present

## 2020-12-12 DIAGNOSIS — N186 End stage renal disease: Secondary | ICD-10-CM | POA: Diagnosis not present

## 2020-12-12 DIAGNOSIS — I120 Hypertensive chronic kidney disease with stage 5 chronic kidney disease or end stage renal disease: Secondary | ICD-10-CM | POA: Diagnosis not present

## 2020-12-12 DIAGNOSIS — Z992 Dependence on renal dialysis: Secondary | ICD-10-CM | POA: Diagnosis not present

## 2020-12-12 DIAGNOSIS — E875 Hyperkalemia: Secondary | ICD-10-CM | POA: Diagnosis not present

## 2020-12-12 DIAGNOSIS — T82898A Other specified complication of vascular prosthetic devices, implants and grafts, initial encounter: Secondary | ICD-10-CM | POA: Diagnosis not present

## 2020-12-12 DIAGNOSIS — D472 Monoclonal gammopathy: Secondary | ICD-10-CM | POA: Diagnosis not present

## 2020-12-12 DIAGNOSIS — N2581 Secondary hyperparathyroidism of renal origin: Secondary | ICD-10-CM | POA: Diagnosis not present

## 2020-12-12 DIAGNOSIS — E8770 Fluid overload, unspecified: Secondary | ICD-10-CM | POA: Diagnosis not present

## 2020-12-12 DIAGNOSIS — T82399A Other mechanical complication of unspecified vascular grafts, initial encounter: Secondary | ICD-10-CM | POA: Diagnosis not present

## 2020-12-12 DIAGNOSIS — Z23 Encounter for immunization: Secondary | ICD-10-CM | POA: Diagnosis not present

## 2020-12-15 DIAGNOSIS — I120 Hypertensive chronic kidney disease with stage 5 chronic kidney disease or end stage renal disease: Secondary | ICD-10-CM | POA: Diagnosis not present

## 2020-12-15 DIAGNOSIS — E8770 Fluid overload, unspecified: Secondary | ICD-10-CM | POA: Diagnosis not present

## 2020-12-15 DIAGNOSIS — Z23 Encounter for immunization: Secondary | ICD-10-CM | POA: Diagnosis not present

## 2020-12-15 DIAGNOSIS — E875 Hyperkalemia: Secondary | ICD-10-CM | POA: Diagnosis not present

## 2020-12-15 DIAGNOSIS — N2581 Secondary hyperparathyroidism of renal origin: Secondary | ICD-10-CM | POA: Diagnosis not present

## 2020-12-15 DIAGNOSIS — N186 End stage renal disease: Secondary | ICD-10-CM | POA: Diagnosis not present

## 2020-12-15 DIAGNOSIS — T82399A Other mechanical complication of unspecified vascular grafts, initial encounter: Secondary | ICD-10-CM | POA: Diagnosis not present

## 2020-12-15 DIAGNOSIS — Z992 Dependence on renal dialysis: Secondary | ICD-10-CM | POA: Diagnosis not present

## 2020-12-15 DIAGNOSIS — D472 Monoclonal gammopathy: Secondary | ICD-10-CM | POA: Diagnosis not present

## 2020-12-16 DIAGNOSIS — N186 End stage renal disease: Secondary | ICD-10-CM | POA: Diagnosis not present

## 2020-12-16 DIAGNOSIS — E875 Hyperkalemia: Secondary | ICD-10-CM | POA: Diagnosis not present

## 2020-12-16 DIAGNOSIS — I120 Hypertensive chronic kidney disease with stage 5 chronic kidney disease or end stage renal disease: Secondary | ICD-10-CM | POA: Diagnosis not present

## 2020-12-16 DIAGNOSIS — Z992 Dependence on renal dialysis: Secondary | ICD-10-CM | POA: Diagnosis not present

## 2020-12-16 DIAGNOSIS — N2581 Secondary hyperparathyroidism of renal origin: Secondary | ICD-10-CM | POA: Diagnosis not present

## 2020-12-16 DIAGNOSIS — Z23 Encounter for immunization: Secondary | ICD-10-CM | POA: Diagnosis not present

## 2020-12-16 DIAGNOSIS — D472 Monoclonal gammopathy: Secondary | ICD-10-CM | POA: Diagnosis not present

## 2020-12-16 DIAGNOSIS — E8770 Fluid overload, unspecified: Secondary | ICD-10-CM | POA: Diagnosis not present

## 2020-12-16 DIAGNOSIS — T82399A Other mechanical complication of unspecified vascular grafts, initial encounter: Secondary | ICD-10-CM | POA: Diagnosis not present

## 2020-12-18 DIAGNOSIS — Z23 Encounter for immunization: Secondary | ICD-10-CM | POA: Diagnosis not present

## 2020-12-18 DIAGNOSIS — Z992 Dependence on renal dialysis: Secondary | ICD-10-CM | POA: Diagnosis not present

## 2020-12-18 DIAGNOSIS — T82399A Other mechanical complication of unspecified vascular grafts, initial encounter: Secondary | ICD-10-CM | POA: Diagnosis not present

## 2020-12-18 DIAGNOSIS — E875 Hyperkalemia: Secondary | ICD-10-CM | POA: Diagnosis not present

## 2020-12-18 DIAGNOSIS — E8770 Fluid overload, unspecified: Secondary | ICD-10-CM | POA: Diagnosis not present

## 2020-12-18 DIAGNOSIS — N2581 Secondary hyperparathyroidism of renal origin: Secondary | ICD-10-CM | POA: Diagnosis not present

## 2020-12-18 DIAGNOSIS — D472 Monoclonal gammopathy: Secondary | ICD-10-CM | POA: Diagnosis not present

## 2020-12-18 DIAGNOSIS — N186 End stage renal disease: Secondary | ICD-10-CM | POA: Diagnosis not present

## 2020-12-18 DIAGNOSIS — I120 Hypertensive chronic kidney disease with stage 5 chronic kidney disease or end stage renal disease: Secondary | ICD-10-CM | POA: Diagnosis not present

## 2020-12-19 DIAGNOSIS — N2581 Secondary hyperparathyroidism of renal origin: Secondary | ICD-10-CM | POA: Diagnosis not present

## 2020-12-19 DIAGNOSIS — E8770 Fluid overload, unspecified: Secondary | ICD-10-CM | POA: Diagnosis not present

## 2020-12-19 DIAGNOSIS — E875 Hyperkalemia: Secondary | ICD-10-CM | POA: Diagnosis not present

## 2020-12-19 DIAGNOSIS — Z992 Dependence on renal dialysis: Secondary | ICD-10-CM | POA: Diagnosis not present

## 2020-12-19 DIAGNOSIS — N186 End stage renal disease: Secondary | ICD-10-CM | POA: Diagnosis not present

## 2020-12-19 DIAGNOSIS — T82399A Other mechanical complication of unspecified vascular grafts, initial encounter: Secondary | ICD-10-CM | POA: Diagnosis not present

## 2020-12-19 DIAGNOSIS — D472 Monoclonal gammopathy: Secondary | ICD-10-CM | POA: Diagnosis not present

## 2020-12-19 DIAGNOSIS — Z23 Encounter for immunization: Secondary | ICD-10-CM | POA: Diagnosis not present

## 2020-12-19 DIAGNOSIS — I120 Hypertensive chronic kidney disease with stage 5 chronic kidney disease or end stage renal disease: Secondary | ICD-10-CM | POA: Diagnosis not present

## 2020-12-22 DIAGNOSIS — Z23 Encounter for immunization: Secondary | ICD-10-CM | POA: Diagnosis not present

## 2020-12-22 DIAGNOSIS — I120 Hypertensive chronic kidney disease with stage 5 chronic kidney disease or end stage renal disease: Secondary | ICD-10-CM | POA: Diagnosis not present

## 2020-12-22 DIAGNOSIS — E8770 Fluid overload, unspecified: Secondary | ICD-10-CM | POA: Diagnosis not present

## 2020-12-22 DIAGNOSIS — N186 End stage renal disease: Secondary | ICD-10-CM | POA: Diagnosis not present

## 2020-12-22 DIAGNOSIS — D472 Monoclonal gammopathy: Secondary | ICD-10-CM | POA: Diagnosis not present

## 2020-12-22 DIAGNOSIS — T82399A Other mechanical complication of unspecified vascular grafts, initial encounter: Secondary | ICD-10-CM | POA: Diagnosis not present

## 2020-12-22 DIAGNOSIS — E875 Hyperkalemia: Secondary | ICD-10-CM | POA: Diagnosis not present

## 2020-12-22 DIAGNOSIS — Z992 Dependence on renal dialysis: Secondary | ICD-10-CM | POA: Diagnosis not present

## 2020-12-22 DIAGNOSIS — N2581 Secondary hyperparathyroidism of renal origin: Secondary | ICD-10-CM | POA: Diagnosis not present

## 2020-12-23 DIAGNOSIS — N186 End stage renal disease: Secondary | ICD-10-CM | POA: Diagnosis not present

## 2020-12-23 DIAGNOSIS — D472 Monoclonal gammopathy: Secondary | ICD-10-CM | POA: Diagnosis not present

## 2020-12-23 DIAGNOSIS — N2581 Secondary hyperparathyroidism of renal origin: Secondary | ICD-10-CM | POA: Diagnosis not present

## 2020-12-23 DIAGNOSIS — Z992 Dependence on renal dialysis: Secondary | ICD-10-CM | POA: Diagnosis not present

## 2020-12-23 DIAGNOSIS — E8770 Fluid overload, unspecified: Secondary | ICD-10-CM | POA: Diagnosis not present

## 2020-12-23 DIAGNOSIS — Z23 Encounter for immunization: Secondary | ICD-10-CM | POA: Diagnosis not present

## 2020-12-23 DIAGNOSIS — E875 Hyperkalemia: Secondary | ICD-10-CM | POA: Diagnosis not present

## 2020-12-23 DIAGNOSIS — T82399A Other mechanical complication of unspecified vascular grafts, initial encounter: Secondary | ICD-10-CM | POA: Diagnosis not present

## 2020-12-23 DIAGNOSIS — I120 Hypertensive chronic kidney disease with stage 5 chronic kidney disease or end stage renal disease: Secondary | ICD-10-CM | POA: Diagnosis not present

## 2020-12-24 DIAGNOSIS — Z992 Dependence on renal dialysis: Secondary | ICD-10-CM | POA: Diagnosis not present

## 2020-12-24 DIAGNOSIS — I129 Hypertensive chronic kidney disease with stage 1 through stage 4 chronic kidney disease, or unspecified chronic kidney disease: Secondary | ICD-10-CM | POA: Diagnosis not present

## 2020-12-24 DIAGNOSIS — N186 End stage renal disease: Secondary | ICD-10-CM | POA: Diagnosis not present

## 2020-12-25 DIAGNOSIS — Z992 Dependence on renal dialysis: Secondary | ICD-10-CM | POA: Diagnosis not present

## 2020-12-25 DIAGNOSIS — T82399A Other mechanical complication of unspecified vascular grafts, initial encounter: Secondary | ICD-10-CM | POA: Diagnosis not present

## 2020-12-25 DIAGNOSIS — N186 End stage renal disease: Secondary | ICD-10-CM | POA: Diagnosis not present

## 2020-12-25 DIAGNOSIS — N2581 Secondary hyperparathyroidism of renal origin: Secondary | ICD-10-CM | POA: Diagnosis not present

## 2020-12-25 DIAGNOSIS — D472 Monoclonal gammopathy: Secondary | ICD-10-CM | POA: Diagnosis not present

## 2020-12-25 DIAGNOSIS — E875 Hyperkalemia: Secondary | ICD-10-CM | POA: Diagnosis not present

## 2020-12-25 DIAGNOSIS — E8770 Fluid overload, unspecified: Secondary | ICD-10-CM | POA: Diagnosis not present

## 2020-12-26 DIAGNOSIS — T82399A Other mechanical complication of unspecified vascular grafts, initial encounter: Secondary | ICD-10-CM | POA: Diagnosis not present

## 2020-12-26 DIAGNOSIS — E8770 Fluid overload, unspecified: Secondary | ICD-10-CM | POA: Diagnosis not present

## 2020-12-26 DIAGNOSIS — D472 Monoclonal gammopathy: Secondary | ICD-10-CM | POA: Diagnosis not present

## 2020-12-26 DIAGNOSIS — Z992 Dependence on renal dialysis: Secondary | ICD-10-CM | POA: Diagnosis not present

## 2020-12-26 DIAGNOSIS — N186 End stage renal disease: Secondary | ICD-10-CM | POA: Diagnosis not present

## 2020-12-26 DIAGNOSIS — E875 Hyperkalemia: Secondary | ICD-10-CM | POA: Diagnosis not present

## 2020-12-26 DIAGNOSIS — N2581 Secondary hyperparathyroidism of renal origin: Secondary | ICD-10-CM | POA: Diagnosis not present

## 2020-12-30 DIAGNOSIS — D689 Coagulation defect, unspecified: Secondary | ICD-10-CM | POA: Diagnosis not present

## 2020-12-30 DIAGNOSIS — D472 Monoclonal gammopathy: Secondary | ICD-10-CM | POA: Diagnosis not present

## 2020-12-30 DIAGNOSIS — D509 Iron deficiency anemia, unspecified: Secondary | ICD-10-CM | POA: Diagnosis not present

## 2020-12-30 DIAGNOSIS — N186 End stage renal disease: Secondary | ICD-10-CM | POA: Diagnosis not present

## 2020-12-30 DIAGNOSIS — N2581 Secondary hyperparathyroidism of renal origin: Secondary | ICD-10-CM | POA: Diagnosis not present

## 2020-12-30 DIAGNOSIS — Z992 Dependence on renal dialysis: Secondary | ICD-10-CM | POA: Diagnosis not present

## 2021-01-01 DIAGNOSIS — N2581 Secondary hyperparathyroidism of renal origin: Secondary | ICD-10-CM | POA: Diagnosis not present

## 2021-01-01 DIAGNOSIS — D509 Iron deficiency anemia, unspecified: Secondary | ICD-10-CM | POA: Diagnosis not present

## 2021-01-01 DIAGNOSIS — D689 Coagulation defect, unspecified: Secondary | ICD-10-CM | POA: Diagnosis not present

## 2021-01-01 DIAGNOSIS — N186 End stage renal disease: Secondary | ICD-10-CM | POA: Diagnosis not present

## 2021-01-01 DIAGNOSIS — Z992 Dependence on renal dialysis: Secondary | ICD-10-CM | POA: Diagnosis not present

## 2021-01-01 DIAGNOSIS — D472 Monoclonal gammopathy: Secondary | ICD-10-CM | POA: Diagnosis not present

## 2021-01-03 DIAGNOSIS — N2581 Secondary hyperparathyroidism of renal origin: Secondary | ICD-10-CM | POA: Diagnosis not present

## 2021-01-03 DIAGNOSIS — N186 End stage renal disease: Secondary | ICD-10-CM | POA: Diagnosis not present

## 2021-01-03 DIAGNOSIS — D472 Monoclonal gammopathy: Secondary | ICD-10-CM | POA: Diagnosis not present

## 2021-01-03 DIAGNOSIS — Z992 Dependence on renal dialysis: Secondary | ICD-10-CM | POA: Diagnosis not present

## 2021-01-03 DIAGNOSIS — D509 Iron deficiency anemia, unspecified: Secondary | ICD-10-CM | POA: Diagnosis not present

## 2021-01-03 DIAGNOSIS — D689 Coagulation defect, unspecified: Secondary | ICD-10-CM | POA: Diagnosis not present

## 2021-01-06 DIAGNOSIS — Z992 Dependence on renal dialysis: Secondary | ICD-10-CM | POA: Diagnosis not present

## 2021-01-06 DIAGNOSIS — D472 Monoclonal gammopathy: Secondary | ICD-10-CM | POA: Diagnosis not present

## 2021-01-06 DIAGNOSIS — N2581 Secondary hyperparathyroidism of renal origin: Secondary | ICD-10-CM | POA: Diagnosis not present

## 2021-01-06 DIAGNOSIS — D689 Coagulation defect, unspecified: Secondary | ICD-10-CM | POA: Diagnosis not present

## 2021-01-06 DIAGNOSIS — N186 End stage renal disease: Secondary | ICD-10-CM | POA: Diagnosis not present

## 2021-01-06 DIAGNOSIS — D509 Iron deficiency anemia, unspecified: Secondary | ICD-10-CM | POA: Diagnosis not present

## 2021-01-08 DIAGNOSIS — Z992 Dependence on renal dialysis: Secondary | ICD-10-CM | POA: Diagnosis not present

## 2021-01-08 DIAGNOSIS — D689 Coagulation defect, unspecified: Secondary | ICD-10-CM | POA: Diagnosis not present

## 2021-01-08 DIAGNOSIS — D509 Iron deficiency anemia, unspecified: Secondary | ICD-10-CM | POA: Diagnosis not present

## 2021-01-08 DIAGNOSIS — D472 Monoclonal gammopathy: Secondary | ICD-10-CM | POA: Diagnosis not present

## 2021-01-08 DIAGNOSIS — N186 End stage renal disease: Secondary | ICD-10-CM | POA: Diagnosis not present

## 2021-01-08 DIAGNOSIS — N2581 Secondary hyperparathyroidism of renal origin: Secondary | ICD-10-CM | POA: Diagnosis not present

## 2021-01-10 DIAGNOSIS — N2581 Secondary hyperparathyroidism of renal origin: Secondary | ICD-10-CM | POA: Diagnosis not present

## 2021-01-10 DIAGNOSIS — D472 Monoclonal gammopathy: Secondary | ICD-10-CM | POA: Diagnosis not present

## 2021-01-10 DIAGNOSIS — D689 Coagulation defect, unspecified: Secondary | ICD-10-CM | POA: Diagnosis not present

## 2021-01-10 DIAGNOSIS — Z992 Dependence on renal dialysis: Secondary | ICD-10-CM | POA: Diagnosis not present

## 2021-01-10 DIAGNOSIS — D509 Iron deficiency anemia, unspecified: Secondary | ICD-10-CM | POA: Diagnosis not present

## 2021-01-10 DIAGNOSIS — N186 End stage renal disease: Secondary | ICD-10-CM | POA: Diagnosis not present

## 2021-01-13 DIAGNOSIS — Z992 Dependence on renal dialysis: Secondary | ICD-10-CM | POA: Diagnosis not present

## 2021-01-13 DIAGNOSIS — D472 Monoclonal gammopathy: Secondary | ICD-10-CM | POA: Diagnosis not present

## 2021-01-13 DIAGNOSIS — D689 Coagulation defect, unspecified: Secondary | ICD-10-CM | POA: Diagnosis not present

## 2021-01-13 DIAGNOSIS — D509 Iron deficiency anemia, unspecified: Secondary | ICD-10-CM | POA: Diagnosis not present

## 2021-01-13 DIAGNOSIS — N2581 Secondary hyperparathyroidism of renal origin: Secondary | ICD-10-CM | POA: Diagnosis not present

## 2021-01-13 DIAGNOSIS — N186 End stage renal disease: Secondary | ICD-10-CM | POA: Diagnosis not present

## 2021-01-15 DIAGNOSIS — N186 End stage renal disease: Secondary | ICD-10-CM | POA: Diagnosis not present

## 2021-01-15 DIAGNOSIS — N2581 Secondary hyperparathyroidism of renal origin: Secondary | ICD-10-CM | POA: Diagnosis not present

## 2021-01-15 DIAGNOSIS — Z992 Dependence on renal dialysis: Secondary | ICD-10-CM | POA: Diagnosis not present

## 2021-01-15 DIAGNOSIS — D472 Monoclonal gammopathy: Secondary | ICD-10-CM | POA: Diagnosis not present

## 2021-01-15 DIAGNOSIS — D509 Iron deficiency anemia, unspecified: Secondary | ICD-10-CM | POA: Diagnosis not present

## 2021-01-15 DIAGNOSIS — D689 Coagulation defect, unspecified: Secondary | ICD-10-CM | POA: Diagnosis not present

## 2021-01-17 DIAGNOSIS — D509 Iron deficiency anemia, unspecified: Secondary | ICD-10-CM | POA: Diagnosis not present

## 2021-01-17 DIAGNOSIS — D689 Coagulation defect, unspecified: Secondary | ICD-10-CM | POA: Diagnosis not present

## 2021-01-17 DIAGNOSIS — Z992 Dependence on renal dialysis: Secondary | ICD-10-CM | POA: Diagnosis not present

## 2021-01-17 DIAGNOSIS — N186 End stage renal disease: Secondary | ICD-10-CM | POA: Diagnosis not present

## 2021-01-17 DIAGNOSIS — D472 Monoclonal gammopathy: Secondary | ICD-10-CM | POA: Diagnosis not present

## 2021-01-17 DIAGNOSIS — N2581 Secondary hyperparathyroidism of renal origin: Secondary | ICD-10-CM | POA: Diagnosis not present

## 2021-01-20 DIAGNOSIS — Z992 Dependence on renal dialysis: Secondary | ICD-10-CM | POA: Diagnosis not present

## 2021-01-20 DIAGNOSIS — D472 Monoclonal gammopathy: Secondary | ICD-10-CM | POA: Diagnosis not present

## 2021-01-20 DIAGNOSIS — D509 Iron deficiency anemia, unspecified: Secondary | ICD-10-CM | POA: Diagnosis not present

## 2021-01-20 DIAGNOSIS — N186 End stage renal disease: Secondary | ICD-10-CM | POA: Diagnosis not present

## 2021-01-20 DIAGNOSIS — N2581 Secondary hyperparathyroidism of renal origin: Secondary | ICD-10-CM | POA: Diagnosis not present

## 2021-01-20 DIAGNOSIS — D689 Coagulation defect, unspecified: Secondary | ICD-10-CM | POA: Diagnosis not present

## 2021-01-22 DIAGNOSIS — D689 Coagulation defect, unspecified: Secondary | ICD-10-CM | POA: Diagnosis not present

## 2021-01-22 DIAGNOSIS — D509 Iron deficiency anemia, unspecified: Secondary | ICD-10-CM | POA: Diagnosis not present

## 2021-01-22 DIAGNOSIS — Z992 Dependence on renal dialysis: Secondary | ICD-10-CM | POA: Diagnosis not present

## 2021-01-22 DIAGNOSIS — N2581 Secondary hyperparathyroidism of renal origin: Secondary | ICD-10-CM | POA: Diagnosis not present

## 2021-01-22 DIAGNOSIS — N186 End stage renal disease: Secondary | ICD-10-CM | POA: Diagnosis not present

## 2021-01-22 DIAGNOSIS — D472 Monoclonal gammopathy: Secondary | ICD-10-CM | POA: Diagnosis not present

## 2021-01-23 DIAGNOSIS — N186 End stage renal disease: Secondary | ICD-10-CM | POA: Diagnosis not present

## 2021-01-23 DIAGNOSIS — I129 Hypertensive chronic kidney disease with stage 1 through stage 4 chronic kidney disease, or unspecified chronic kidney disease: Secondary | ICD-10-CM | POA: Diagnosis not present

## 2021-01-23 DIAGNOSIS — Z992 Dependence on renal dialysis: Secondary | ICD-10-CM | POA: Diagnosis not present

## 2021-01-24 DIAGNOSIS — N186 End stage renal disease: Secondary | ICD-10-CM | POA: Diagnosis not present

## 2021-01-24 DIAGNOSIS — I129 Hypertensive chronic kidney disease with stage 1 through stage 4 chronic kidney disease, or unspecified chronic kidney disease: Secondary | ICD-10-CM | POA: Diagnosis not present

## 2021-01-24 DIAGNOSIS — Z992 Dependence on renal dialysis: Secondary | ICD-10-CM | POA: Diagnosis not present

## 2021-01-27 DIAGNOSIS — N186 End stage renal disease: Secondary | ICD-10-CM | POA: Diagnosis not present

## 2021-01-27 DIAGNOSIS — D509 Iron deficiency anemia, unspecified: Secondary | ICD-10-CM | POA: Diagnosis not present

## 2021-01-27 DIAGNOSIS — D472 Monoclonal gammopathy: Secondary | ICD-10-CM | POA: Diagnosis not present

## 2021-01-27 DIAGNOSIS — N2581 Secondary hyperparathyroidism of renal origin: Secondary | ICD-10-CM | POA: Diagnosis not present

## 2021-01-27 DIAGNOSIS — Z992 Dependence on renal dialysis: Secondary | ICD-10-CM | POA: Diagnosis not present

## 2021-01-27 DIAGNOSIS — D689 Coagulation defect, unspecified: Secondary | ICD-10-CM | POA: Diagnosis not present

## 2021-01-27 DIAGNOSIS — Z23 Encounter for immunization: Secondary | ICD-10-CM | POA: Diagnosis not present

## 2021-01-29 ENCOUNTER — Inpatient Hospital Stay (HOSPITAL_COMMUNITY)
Admission: EM | Admit: 2021-01-29 | Discharge: 2021-02-12 | DRG: 521 | Disposition: A | Discharge status: Skilled Nursing Facility | Payer: Medicare Other | Attending: Internal Medicine | Admitting: Internal Medicine

## 2021-01-29 ENCOUNTER — Other Ambulatory Visit: Payer: Self-pay

## 2021-01-29 ENCOUNTER — Emergency Department (HOSPITAL_COMMUNITY): Payer: Medicare Other

## 2021-01-29 DIAGNOSIS — R54 Age-related physical debility: Secondary | ICD-10-CM | POA: Diagnosis not present

## 2021-01-29 DIAGNOSIS — R338 Other retention of urine: Secondary | ICD-10-CM | POA: Diagnosis present

## 2021-01-29 DIAGNOSIS — E43 Unspecified severe protein-calorie malnutrition: Secondary | ICD-10-CM | POA: Insufficient documentation

## 2021-01-29 DIAGNOSIS — S0990XA Unspecified injury of head, initial encounter: Secondary | ICD-10-CM

## 2021-01-29 DIAGNOSIS — T82868A Thrombosis of vascular prosthetic devices, implants and grafts, initial encounter: Secondary | ICD-10-CM | POA: Diagnosis not present

## 2021-01-29 DIAGNOSIS — N138 Other obstructive and reflux uropathy: Secondary | ICD-10-CM | POA: Diagnosis present

## 2021-01-29 DIAGNOSIS — N186 End stage renal disease: Secondary | ICD-10-CM | POA: Diagnosis not present

## 2021-01-29 DIAGNOSIS — Y92013 Bedroom of single-family (private) house as the place of occurrence of the external cause: Secondary | ICD-10-CM

## 2021-01-29 DIAGNOSIS — S72009A Fracture of unspecified part of neck of unspecified femur, initial encounter for closed fracture: Secondary | ICD-10-CM

## 2021-01-29 DIAGNOSIS — Y838 Other surgical procedures as the cause of abnormal reaction of the patient, or of later complication, without mention of misadventure at the time of the procedure: Secondary | ICD-10-CM | POA: Diagnosis present

## 2021-01-29 DIAGNOSIS — Y9389 Activity, other specified: Secondary | ICD-10-CM | POA: Diagnosis not present

## 2021-01-29 DIAGNOSIS — R2689 Other abnormalities of gait and mobility: Secondary | ICD-10-CM | POA: Diagnosis not present

## 2021-01-29 DIAGNOSIS — I447 Left bundle-branch block, unspecified: Secondary | ICD-10-CM | POA: Diagnosis present

## 2021-01-29 DIAGNOSIS — K219 Gastro-esophageal reflux disease without esophagitis: Secondary | ICD-10-CM | POA: Diagnosis not present

## 2021-01-29 DIAGNOSIS — E871 Hypo-osmolality and hyponatremia: Secondary | ICD-10-CM | POA: Diagnosis not present

## 2021-01-29 DIAGNOSIS — I517 Cardiomegaly: Secondary | ICD-10-CM | POA: Diagnosis not present

## 2021-01-29 DIAGNOSIS — I358 Other nonrheumatic aortic valve disorders: Secondary | ICD-10-CM | POA: Diagnosis present

## 2021-01-29 DIAGNOSIS — Z743 Need for continuous supervision: Secondary | ICD-10-CM | POA: Diagnosis not present

## 2021-01-29 DIAGNOSIS — R29898 Other symptoms and signs involving the musculoskeletal system: Secondary | ICD-10-CM | POA: Diagnosis not present

## 2021-01-29 DIAGNOSIS — R1312 Dysphagia, oropharyngeal phase: Secondary | ICD-10-CM | POA: Diagnosis not present

## 2021-01-29 DIAGNOSIS — Z043 Encounter for examination and observation following other accident: Secondary | ICD-10-CM | POA: Diagnosis not present

## 2021-01-29 DIAGNOSIS — I129 Hypertensive chronic kidney disease with stage 1 through stage 4 chronic kidney disease, or unspecified chronic kidney disease: Secondary | ICD-10-CM | POA: Diagnosis not present

## 2021-01-29 DIAGNOSIS — R262 Difficulty in walking, not elsewhere classified: Secondary | ICD-10-CM | POA: Diagnosis not present

## 2021-01-29 DIAGNOSIS — I10 Essential (primary) hypertension: Secondary | ICD-10-CM | POA: Diagnosis not present

## 2021-01-29 DIAGNOSIS — I480 Paroxysmal atrial fibrillation: Secondary | ICD-10-CM | POA: Diagnosis not present

## 2021-01-29 DIAGNOSIS — S72002A Fracture of unspecified part of neck of left femur, initial encounter for closed fracture: Principal | ICD-10-CM | POA: Diagnosis present

## 2021-01-29 DIAGNOSIS — M1990 Unspecified osteoarthritis, unspecified site: Secondary | ICD-10-CM | POA: Diagnosis present

## 2021-01-29 DIAGNOSIS — W19XXXA Unspecified fall, initial encounter: Secondary | ICD-10-CM

## 2021-01-29 DIAGNOSIS — N2581 Secondary hyperparathyroidism of renal origin: Secondary | ICD-10-CM | POA: Diagnosis present

## 2021-01-29 DIAGNOSIS — I4891 Unspecified atrial fibrillation: Secondary | ICD-10-CM | POA: Diagnosis not present

## 2021-01-29 DIAGNOSIS — N189 Chronic kidney disease, unspecified: Secondary | ICD-10-CM | POA: Diagnosis present

## 2021-01-29 DIAGNOSIS — H938X1 Other specified disorders of right ear: Secondary | ICD-10-CM | POA: Diagnosis not present

## 2021-01-29 DIAGNOSIS — Z20822 Contact with and (suspected) exposure to covid-19: Secondary | ICD-10-CM | POA: Diagnosis present

## 2021-01-29 DIAGNOSIS — I5033 Acute on chronic diastolic (congestive) heart failure: Secondary | ICD-10-CM | POA: Diagnosis present

## 2021-01-29 DIAGNOSIS — S0101XA Laceration without foreign body of scalp, initial encounter: Secondary | ICD-10-CM | POA: Diagnosis not present

## 2021-01-29 DIAGNOSIS — Z471 Aftercare following joint replacement surgery: Secondary | ICD-10-CM | POA: Diagnosis not present

## 2021-01-29 DIAGNOSIS — D72828 Other elevated white blood cell count: Secondary | ICD-10-CM | POA: Diagnosis not present

## 2021-01-29 DIAGNOSIS — Z23 Encounter for immunization: Secondary | ICD-10-CM | POA: Diagnosis not present

## 2021-01-29 DIAGNOSIS — J189 Pneumonia, unspecified organism: Secondary | ICD-10-CM

## 2021-01-29 DIAGNOSIS — K6389 Other specified diseases of intestine: Secondary | ICD-10-CM | POA: Diagnosis not present

## 2021-01-29 DIAGNOSIS — D638 Anemia in other chronic diseases classified elsewhere: Secondary | ICD-10-CM

## 2021-01-29 DIAGNOSIS — I5041 Acute combined systolic (congestive) and diastolic (congestive) heart failure: Secondary | ICD-10-CM | POA: Diagnosis not present

## 2021-01-29 DIAGNOSIS — J9601 Acute respiratory failure with hypoxia: Secondary | ICD-10-CM | POA: Diagnosis present

## 2021-01-29 DIAGNOSIS — I959 Hypotension, unspecified: Secondary | ICD-10-CM | POA: Diagnosis not present

## 2021-01-29 DIAGNOSIS — I132 Hypertensive heart and chronic kidney disease with heart failure and with stage 5 chronic kidney disease, or end stage renal disease: Secondary | ICD-10-CM | POA: Diagnosis not present

## 2021-01-29 DIAGNOSIS — D62 Acute posthemorrhagic anemia: Secondary | ICD-10-CM | POA: Diagnosis not present

## 2021-01-29 DIAGNOSIS — I5042 Chronic combined systolic (congestive) and diastolic (congestive) heart failure: Secondary | ICD-10-CM | POA: Diagnosis present

## 2021-01-29 DIAGNOSIS — R52 Pain, unspecified: Secondary | ICD-10-CM

## 2021-01-29 DIAGNOSIS — S72002D Fracture of unspecified part of neck of left femur, subsequent encounter for closed fracture with routine healing: Secondary | ICD-10-CM | POA: Diagnosis not present

## 2021-01-29 DIAGNOSIS — J811 Chronic pulmonary edema: Secondary | ICD-10-CM | POA: Diagnosis not present

## 2021-01-29 DIAGNOSIS — M6281 Muscle weakness (generalized): Secondary | ICD-10-CM | POA: Diagnosis not present

## 2021-01-29 DIAGNOSIS — Z4901 Encounter for fitting and adjustment of extracorporeal dialysis catheter: Secondary | ICD-10-CM | POA: Diagnosis not present

## 2021-01-29 DIAGNOSIS — Z09 Encounter for follow-up examination after completed treatment for conditions other than malignant neoplasm: Secondary | ICD-10-CM

## 2021-01-29 DIAGNOSIS — R498 Other voice and resonance disorders: Secondary | ICD-10-CM | POA: Diagnosis not present

## 2021-01-29 DIAGNOSIS — Z9889 Other specified postprocedural states: Secondary | ICD-10-CM | POA: Diagnosis not present

## 2021-01-29 DIAGNOSIS — I5021 Acute systolic (congestive) heart failure: Secondary | ICD-10-CM | POA: Diagnosis not present

## 2021-01-29 DIAGNOSIS — K567 Ileus, unspecified: Secondary | ICD-10-CM | POA: Diagnosis not present

## 2021-01-29 DIAGNOSIS — G319 Degenerative disease of nervous system, unspecified: Secondary | ICD-10-CM | POA: Diagnosis not present

## 2021-01-29 DIAGNOSIS — Y92009 Unspecified place in unspecified non-institutional (private) residence as the place of occurrence of the external cause: Secondary | ICD-10-CM

## 2021-01-29 DIAGNOSIS — I509 Heart failure, unspecified: Secondary | ICD-10-CM | POA: Diagnosis not present

## 2021-01-29 DIAGNOSIS — D72829 Elevated white blood cell count, unspecified: Secondary | ICD-10-CM | POA: Diagnosis not present

## 2021-01-29 DIAGNOSIS — K3189 Other diseases of stomach and duodenum: Secondary | ICD-10-CM | POA: Diagnosis not present

## 2021-01-29 DIAGNOSIS — R140 Abdominal distension (gaseous): Secondary | ICD-10-CM

## 2021-01-29 DIAGNOSIS — I255 Ischemic cardiomyopathy: Secondary | ICD-10-CM | POA: Diagnosis present

## 2021-01-29 DIAGNOSIS — N250 Renal osteodystrophy: Secondary | ICD-10-CM | POA: Diagnosis not present

## 2021-01-29 DIAGNOSIS — D649 Anemia, unspecified: Secondary | ICD-10-CM | POA: Diagnosis not present

## 2021-01-29 DIAGNOSIS — I120 Hypertensive chronic kidney disease with stage 5 chronic kidney disease or end stage renal disease: Secondary | ICD-10-CM | POA: Diagnosis not present

## 2021-01-29 DIAGNOSIS — Z79899 Other long term (current) drug therapy: Secondary | ICD-10-CM

## 2021-01-29 DIAGNOSIS — I953 Hypotension of hemodialysis: Secondary | ICD-10-CM | POA: Diagnosis not present

## 2021-01-29 DIAGNOSIS — J9811 Atelectasis: Secondary | ICD-10-CM | POA: Diagnosis present

## 2021-01-29 DIAGNOSIS — M170 Bilateral primary osteoarthritis of knee: Secondary | ICD-10-CM | POA: Diagnosis not present

## 2021-01-29 DIAGNOSIS — K9189 Other postprocedural complications and disorders of digestive system: Secondary | ICD-10-CM | POA: Diagnosis not present

## 2021-01-29 DIAGNOSIS — D472 Monoclonal gammopathy: Secondary | ICD-10-CM | POA: Diagnosis present

## 2021-01-29 DIAGNOSIS — Z992 Dependence on renal dialysis: Secondary | ICD-10-CM

## 2021-01-29 DIAGNOSIS — N401 Enlarged prostate with lower urinary tract symptoms: Secondary | ICD-10-CM | POA: Diagnosis present

## 2021-01-29 DIAGNOSIS — I471 Supraventricular tachycardia: Secondary | ICD-10-CM | POA: Diagnosis not present

## 2021-01-29 DIAGNOSIS — M47812 Spondylosis without myelopathy or radiculopathy, cervical region: Secondary | ICD-10-CM | POA: Diagnosis not present

## 2021-01-29 DIAGNOSIS — Y712 Prosthetic and other implants, materials and accessory cardiovascular devices associated with adverse incidents: Secondary | ICD-10-CM | POA: Diagnosis not present

## 2021-01-29 DIAGNOSIS — Z7982 Long term (current) use of aspirin: Secondary | ICD-10-CM

## 2021-01-29 DIAGNOSIS — R6889 Other general symptoms and signs: Secondary | ICD-10-CM | POA: Diagnosis not present

## 2021-01-29 DIAGNOSIS — K5730 Diverticulosis of large intestine without perforation or abscess without bleeding: Secondary | ICD-10-CM | POA: Diagnosis not present

## 2021-01-29 DIAGNOSIS — R0902 Hypoxemia: Secondary | ICD-10-CM | POA: Diagnosis not present

## 2021-01-29 DIAGNOSIS — D509 Iron deficiency anemia, unspecified: Secondary | ICD-10-CM | POA: Diagnosis not present

## 2021-01-29 DIAGNOSIS — D631 Anemia in chronic kidney disease: Secondary | ICD-10-CM | POA: Diagnosis not present

## 2021-01-29 DIAGNOSIS — W1839XA Other fall on same level, initial encounter: Secondary | ICD-10-CM | POA: Diagnosis present

## 2021-01-29 DIAGNOSIS — I5043 Acute on chronic combined systolic (congestive) and diastolic (congestive) heart failure: Secondary | ICD-10-CM | POA: Diagnosis not present

## 2021-01-29 DIAGNOSIS — D689 Coagulation defect, unspecified: Secondary | ICD-10-CM | POA: Diagnosis not present

## 2021-01-29 DIAGNOSIS — I440 Atrioventricular block, first degree: Secondary | ICD-10-CM | POA: Diagnosis present

## 2021-01-29 DIAGNOSIS — Z96642 Presence of left artificial hip joint: Secondary | ICD-10-CM | POA: Diagnosis not present

## 2021-01-29 DIAGNOSIS — T82590A Other mechanical complication of surgically created arteriovenous fistula, initial encounter: Secondary | ICD-10-CM | POA: Diagnosis not present

## 2021-01-29 DIAGNOSIS — Z8546 Personal history of malignant neoplasm of prostate: Secondary | ICD-10-CM

## 2021-01-29 DIAGNOSIS — E785 Hyperlipidemia, unspecified: Secondary | ICD-10-CM | POA: Diagnosis not present

## 2021-01-29 DIAGNOSIS — J301 Allergic rhinitis due to pollen: Secondary | ICD-10-CM | POA: Diagnosis not present

## 2021-01-29 HISTORY — DX: Unspecified kidney failure: N19

## 2021-01-29 HISTORY — DX: Unspecified fall, initial encounter: W19.XXXA

## 2021-01-29 LAB — CBC WITH DIFFERENTIAL/PLATELET
Abs Immature Granulocytes: 0.09 10*3/uL — ABNORMAL HIGH (ref 0.00–0.07)
Basophils Absolute: 0 10*3/uL (ref 0.0–0.1)
Basophils Relative: 0 %
Eosinophils Absolute: 0 10*3/uL (ref 0.0–0.5)
Eosinophils Relative: 0 %
HCT: 34 % — ABNORMAL LOW (ref 39.0–52.0)
Hemoglobin: 11.1 g/dL — ABNORMAL LOW (ref 13.0–17.0)
Immature Granulocytes: 1 %
Lymphocytes Relative: 9 %
Lymphs Abs: 1.4 10*3/uL (ref 0.7–4.0)
MCH: 32.6 pg (ref 26.0–34.0)
MCHC: 32.6 g/dL (ref 30.0–36.0)
MCV: 100 fL (ref 80.0–100.0)
Monocytes Absolute: 1.7 10*3/uL — ABNORMAL HIGH (ref 0.1–1.0)
Monocytes Relative: 11 %
Neutro Abs: 12.5 10*3/uL — ABNORMAL HIGH (ref 1.7–7.7)
Neutrophils Relative %: 79 %
Platelets: 276 10*3/uL (ref 150–400)
RBC: 3.4 MIL/uL — ABNORMAL LOW (ref 4.22–5.81)
RDW: 13.9 % (ref 11.5–15.5)
WBC: 15.6 10*3/uL — ABNORMAL HIGH (ref 4.0–10.5)
nRBC: 0 % (ref 0.0–0.2)

## 2021-01-29 LAB — COMPREHENSIVE METABOLIC PANEL
ALT: 15 U/L (ref 0–44)
AST: 25 U/L (ref 15–41)
Albumin: 3.1 g/dL — ABNORMAL LOW (ref 3.5–5.0)
Alkaline Phosphatase: 113 U/L (ref 38–126)
Anion gap: 13 (ref 5–15)
BUN: 31 mg/dL — ABNORMAL HIGH (ref 8–23)
CO2: 27 mmol/L (ref 22–32)
Calcium: 9.6 mg/dL (ref 8.9–10.3)
Chloride: 95 mmol/L — ABNORMAL LOW (ref 98–111)
Creatinine, Ser: 3.29 mg/dL — ABNORMAL HIGH (ref 0.61–1.24)
GFR, Estimated: 18 mL/min — ABNORMAL LOW (ref >60)
Glucose, Bld: 119 mg/dL — ABNORMAL HIGH (ref 70–99)
Potassium: 4.1 mmol/L (ref 3.5–5.1)
Sodium: 135 mmol/L (ref 135–145)
Total Bilirubin: 0.9 mg/dL (ref 0.3–1.2)
Total Protein: 5.7 g/dL — ABNORMAL LOW (ref 6.5–8.1)

## 2021-01-29 LAB — RESP PANEL BY RT-PCR (FLU A&B, COVID) ARPGX2
Influenza A by PCR: NEGATIVE
Influenza B by PCR: NEGATIVE
SARS Coronavirus 2 by RT PCR: NEGATIVE

## 2021-01-29 LAB — PROTIME-INR
INR: 1 (ref 0.8–1.2)
Prothrombin Time: 12.8 seconds (ref 11.4–15.2)

## 2021-01-29 LAB — TYPE AND SCREEN
ABO/RH(D): O POS
Antibody Screen: NEGATIVE

## 2021-01-29 LAB — PHOSPHORUS: Phosphorus: 4.2 mg/dL (ref 2.5–4.6)

## 2021-01-29 LAB — MAGNESIUM: Magnesium: 1.9 mg/dL (ref 1.7–2.4)

## 2021-01-29 LAB — ABO/RH: ABO/RH(D): O POS

## 2021-01-29 LAB — LIPASE, BLOOD: Lipase: 27 U/L (ref 11–51)

## 2021-01-29 MED ORDER — METOPROLOL SUCCINATE ER 50 MG PO TB24
50.0000 mg | ORAL_TABLET | Freq: Every evening | ORAL | Status: DC
  Administered 2021-01-29 – 2021-02-04 (×6): 50 mg via ORAL
  Filled 2021-01-29: qty 2
  Filled 2021-01-29 (×5): qty 1

## 2021-01-29 MED ORDER — HEPARIN SODIUM (PORCINE) 5000 UNIT/ML IJ SOLN
5000.0000 [IU] | Freq: Three times a day (TID) | INTRAMUSCULAR | Status: DC
  Administered 2021-01-29 – 2021-02-10 (×33): 5000 [IU] via SUBCUTANEOUS
  Filled 2021-01-29 (×33): qty 1

## 2021-01-29 MED ORDER — FENTANYL CITRATE PF 50 MCG/ML IJ SOSY
100.0000 ug | PREFILLED_SYRINGE | INTRAMUSCULAR | Status: DC | PRN
  Administered 2021-01-29 (×2): 100 ug via INTRAVENOUS
  Filled 2021-01-29 (×2): qty 2

## 2021-01-29 MED ORDER — CHLORHEXIDINE GLUCONATE CLOTH 2 % EX PADS
6.0000 | MEDICATED_PAD | Freq: Every day | CUTANEOUS | Status: DC
  Administered 2021-01-30 – 2021-02-09 (×11): 6 via TOPICAL

## 2021-01-29 MED ORDER — LORATADINE 10 MG PO TABS
10.0000 mg | ORAL_TABLET | Freq: Every day | ORAL | Status: DC
  Administered 2021-01-29 – 2021-02-02 (×4): 10 mg via ORAL
  Filled 2021-01-29 (×4): qty 1

## 2021-01-29 MED ORDER — TAMSULOSIN HCL 0.4 MG PO CAPS
0.4000 mg | ORAL_CAPSULE | Freq: Two times a day (BID) | ORAL | Status: DC
  Administered 2021-01-29 – 2021-02-11 (×22): 0.4 mg via ORAL
  Filled 2021-01-29 (×23): qty 1

## 2021-01-29 MED ORDER — OXYCODONE HCL 5 MG PO TABS
5.0000 mg | ORAL_TABLET | ORAL | Status: DC | PRN
  Administered 2021-01-30 – 2021-02-10 (×21): 5 mg via ORAL
  Filled 2021-01-29 (×23): qty 1

## 2021-01-29 MED ORDER — FENTANYL CITRATE PF 50 MCG/ML IJ SOSY
25.0000 ug | PREFILLED_SYRINGE | INTRAMUSCULAR | Status: DC | PRN
  Administered 2021-01-29: 25 ug via INTRAVENOUS
  Filled 2021-01-29: qty 1

## 2021-01-29 MED ORDER — MUPIROCIN 2 % EX OINT
TOPICAL_OINTMENT | Freq: Two times a day (BID) | CUTANEOUS | Status: DC
  Filled 2021-01-29: qty 22

## 2021-01-29 MED ORDER — BACITRACIN-NEOMYCIN-POLYMYXIN OINTMENT TUBE
TOPICAL_OINTMENT | Freq: Every day | CUTANEOUS | Status: DC
  Filled 2021-01-29: qty 14

## 2021-01-29 MED ORDER — HYDROCODONE-ACETAMINOPHEN 5-325 MG PO TABS
1.0000 | ORAL_TABLET | Freq: Four times a day (QID) | ORAL | Status: DC | PRN
  Administered 2021-01-29: 1 via ORAL
  Administered 2021-01-29 – 2021-01-30 (×2): 2 via ORAL
  Filled 2021-01-29 (×2): qty 2
  Filled 2021-01-29: qty 1

## 2021-01-29 MED ORDER — SENNOSIDES-DOCUSATE SODIUM 8.6-50 MG PO TABS
1.0000 | ORAL_TABLET | Freq: Every day | ORAL | Status: DC
  Administered 2021-01-29 – 2021-01-31 (×3): 1 via ORAL
  Filled 2021-01-29 (×3): qty 1

## 2021-01-29 NOTE — Consult Note (Signed)
Renal Service Consult Note [facility]  Jeffrey Payne 01/29/2021 [name], MD Requesting Physician: Dr. Harvest Forest  Reason for Consult: ESRD pt w/ fall and L hip fracture HPI: The patient is a 87 y.o. year-old w/ hx of anemia, prostate cancer, ESRD on HD, HOH, HTN presented after a fall to ED. In ED xrays showed a left hip fracture. Asked to see for dialysis.   Seen in ED, c/o pain L hip, no other c/o's.  Started HD about 2 mos ago, was at [place], now at [facility].  No SOB, cough or CP, no fevers or wounds.   Last admit was Dec 2021 admitted here w/ AKI on CKD IV, cardiorenal component possible. B/l creat 2.8- 3.4. hx urinary retention. L BC AVF not mature. Diuresed 5 kg and dc'd on po lasix.    ROS - denies CP, no joint pain, no HA, no blurry vision, no rash, no diarrhea, no nausea/ vomiting   Past Medical History  Past Medical History:  Diagnosis Date   Anemia    Arthritis    knees   Cancer (HCC)    prostate   CKD (chronic kidney disease) stage 3, GFR 30-59 ml/min (HCC)    Dyspnea    increasing due  fluid   ED (erectile dysfunction)    GERD (gastroesophageal reflux disease)    ocassional    Hard of hearing    Heart murmur    Hypertension    Hypertriglyceridemia    Rosacea    Past Surgical History  Past Surgical History:  Procedure Laterality Date   A/V FISTULAGRAM N/A 07/25/2020   Procedure: A/V FISTULAGRAM - Left Arm;  Surgeon: Elam Dutch, MD;  Location: [place] [facility];  Service: Cardiovascular;  Laterality: N/A;   AV FISTULA PLACEMENT Left 03/04/2020   Procedure: BRACHIOCEPHALIC ARTERIOVENOUS (AV) FISTULA CREATION LEFT;  Surgeon: Elam Dutch, MD;  Location: [facility] OR;  Service: Vascular;  Laterality: Left;   AV FISTULA PLACEMENT Right 07/28/2020   Procedure: RIGHT  ARM ARTERIOVENOUS FISTULA CREATION;  Surgeon: Elam Dutch, MD;  Location: [facility] OR;  Service: Vascular;  Laterality: Right;   COLONOSCOPY     renal cyst biopsy      TONSILLECTOMY     Family History  Family History  Problem Relation Age of Onset   Cancer Mother    Social History  reports that he has never smoked. He has never used smokeless tobacco. He reports current alcohol use of about 14.0 standard drinks per week. He reports that he does not use drugs. Allergies No Known Allergies Home medications Prior to Admission medications   Medication Sig Start Date End Date Taking? Authorizing Provider  acetaminophen (TYLENOL) 650 MG CR tablet Take 650 mg by mouth every 8 (eight) hours as needed for pain.    [provider]  amLODipine (NORVASC) 10 MG tablet Take 10 mg by mouth daily.    [provider]  calcitRIOL (ROCALTROL) 0.25 MCG capsule Take 0.25 mcg by mouth daily.  08/20/19   [provider]  cetirizine (ZYRTEC) 10 MG tablet Take 10 mg by mouth daily.    [provider]  COVID-19 mRNA vaccine, Pfizer, 30 MCG/0.3ML injection INJECT AS DIRECTED 02/05/20 02/04/21  Carlyle Basques, MD  fluticasone [facility]) 50 MCG/ACT nasal spray Place 1 spray into the nose daily as needed for allergies.    [provider]  furosemide (LASIX) 40 MG tablet Take 1 tablet (40 mg total) by mouth daily. Patient  taking differently: Take 40 mg by mouth 2 (two) times daily as needed for fluid or edema. 04/23/20   Little Ishikawa, MD  furosemide (LASIX) 40 MG tablet Take 1 tablet (40 mg total) by mouth daily as needed for fluid or edema (take after dinner nightly prn for edema or excess fluid). 04/22/20   Little Ishikawa, MD  Homeopathic Products (ARNICARE ARNICA) CREA Apply 1 application topically daily as needed (arthritis pain).    [provider]  hydrALAZINE (APRESOLINE) 25 MG tablet Take 25 mg by mouth 3 (three) times daily. 07/17/20   [provider]  metoprolol succinate (TOPROL-XL) 50 MG 24 hr tablet Take 50 mg by mouth at bedtime.  05/27/14   [provider]  oxyCODONE-acetaminophen  (PERCOCET) 5-325 MG tablet Take 1 tablet by mouth every 4 (four) hours as needed for severe pain. 07/28/20 07/28/21  Baglia, Corrina, PA-C  tamsulosin (FLOMAX) 0.4 MG CAPS capsule Take 1 capsule (0.4 mg total) by mouth daily after supper. Patient taking differently: Take 0.4 mg by mouth in the morning and at bedtime. 10/09/19   Patrecia Pour, MD     Vitals:   01/29/21 0830 01/29/21 0930 01/29/21 1100 01/29/21 1130  BP:  (!) 146/64 134/67 (!) 140/57  Pulse: 91 91 84 88  Resp: (!) 21 18 11 18   Temp:      TempSrc:      SpO2: 97% 93% 90% 94%  Weight:      Height:       Exam Gen alert, no distress, frail, elderly man No rash, cyanosis or gangrene Sclera anicteric, throat clear  No jvd or bruits Chest clear bilat to bases, no rales/ wheezing RRR no MRG Abd soft ntnd no mass or ascites +bs GU normal MS no joint effusions or deformity Ext no LE or UE edema, no wounds or ulcers Neuro is alert, Ox 3 , nf RFA AVF+bruit        Home meds include - norvasc, lasix , hydralazine 25 tid, toprol xl 50 hs, percocet prn, flomax , prns    OP HD: TTS [facility]  4h  400/700  54.5kg  2/2 bath  RFA AVF  Hep 4000   - no esa, no vdra  - recent start  CXR - possible IS edema RUL  Assessment/ Plan: Fall/ L hip fracture - npo after MN, for surgery 10/7.  ESRD - on HD TTS. Started 2 mos ago. HD today or tonight.  Resp failure w/ hypoxia - acute, SpO2 88% in ED. On nasal O2, not in distress. Possible RUL IS edema on CXR. Plan HD UF 2.5-3 L as tolerated.  Atrial fib - per pmd Anemia ckd - Hb 11, follow MBD ckd - not on vdra at this time, Ca ok Urinary retention - self cath's at home. Foley placed for surgery, per pmd.       Kelly Splinter  MD 01/29/2021, 12:03 PM  Recent Labs  Lab 01/29/21 0840  WBC 15.6*  HGB 11.1*   Recent Labs  Lab 01/29/21 0840  K 4.1  BUN 31*  CREATININE 3.29*  CALCIUM 9.6  PHOS 4.2

## 2021-01-29 NOTE — H&P (Signed)
History and Physical    Jeffrey Payne GEX:[id number] DOB: May 06, 1932 DOA: 01/29/2021  Referring MD/NP/PA: Nanda Quinton, MD PCP: Kristen Loader, FNP  Patient coming from: Home (lives with wife) via EMS  Chief Complaint: Left hip pain  I have personally briefly reviewed patient's old medical records in [place]   HPI: Jeffrey Payne is a 87 y.o. male with medical history significant of hypertension, ESRD on HD, state cancer, arthritis, and GERD who presents after having a fall at home with left hip pain.  He was standing at the end of the bed adjusting his underwear when he lost his balance and fell forward.  He denies any loss of consciousness, but thinks that he hit his head on the [place] drawer that is nearby.  Patient noted significant pain in his left hip, but was able to be assisted back into bed with the help of his wife.  Normally patient ambulates with use of a walker.  He is not on any blood thinners.  Prior to going to bed last night patient did complain of having some mild shortness of breath, but it improved when he was lying on his side.    He is due for hemodialysis today.  Denies having any fever, chest pain, palpitations, nausea, vomiting, diarrhea, or significant cough.  Him and his wife believe that he had a tetanus booster within the last 10 years.  Any kind of movement made pain in his left leg worse, and therefore he came into the hospital for further evaluation.  In route with EMS patient was given fentanyl 150 mcg be.  O2 sats noted to drop as low as 88% for which patient was placed on 2 L of nasal cannula oxygen with improvement in O2 saturation to 96%.    ED Course: Upon admission into the emergency department patient was seen to be afebrile, pulse 84-1 02, respirations 11-29, blood pressures maintained, and O2 saturations 90-97% on 2 L nasal cannula oxygen.  Chest x-ray noted chronic cardiomegaly with increased pulmonary vascular congestion concerning for mild or  developing interstitial edema.  X-rays of the hip revealed an acute impacted fracture of the left femoral neck.  Labs significant for WBC of 15.6, hemoglobin 11.1, potassium 4.1, BUN 31, and creatinine 3.29.  CT scan of the head and cervical spine did not note any acute abnormalities.  The patient's scalp laceration was cleaned and iodine placed by the ED provider.  Orthopedics have been formally consulted and plan on possibly doing surgery tomorrow.  Review of Systems  Constitutional:  Negative for fever and malaise/fatigue.  Eyes:  Negative for photophobia.  Respiratory:  Negative for shortness of breath.   Cardiovascular:  Negative for chest pain and leg swelling.  Gastrointestinal:  Negative for abdominal pain, nausea and vomiting.  Genitourinary:  Negative for dysuria.  Musculoskeletal:  Positive for falls and joint pain.  Skin:        Positive for skin laceration  Neurological:  Negative for focal weakness and loss of consciousness.  Psychiatric/Behavioral:  Negative for memory loss and substance abuse.    Past Medical History:  Diagnosis Date   Anemia    Arthritis    knees   Cancer (HCC)    prostate   CKD (chronic kidney disease) stage 3, GFR 30-59 ml/min (HCC)    Dyspnea    increasing due  fluid   ED (erectile dysfunction)    GERD (gastroesophageal reflux disease)    ocassional    Hard of hearing  Heart murmur    Hypertension    Hypertriglyceridemia    Rosacea     Past Surgical History:  Procedure Laterality Date   A/V FISTULAGRAM N/A 07/25/2020   Procedure: A/V FISTULAGRAM - Left Arm;  Surgeon: Elam Dutch, MD;  Location: [facility];  Service: Cardiovascular;  Laterality: N/A;   AV FISTULA PLACEMENT Left 03/04/2020   Procedure: BRACHIOCEPHALIC ARTERIOVENOUS (AV) FISTULA CREATION LEFT;  Surgeon: Elam Dutch, MD;  Location: [facility] OR;  Service: Vascular;  Laterality: Left;   AV FISTULA PLACEMENT Right 07/28/2020   Procedure: RIGHT  ARM ARTERIOVENOUS  FISTULA CREATION;  Surgeon: Elam Dutch, MD;  Location: [facility] OR;  Service: Vascular;  Laterality: Right;   COLONOSCOPY     renal cyst biopsy     TONSILLECTOMY       reports that he has never smoked. He has never used smokeless tobacco. He reports current alcohol use of about 14.0 standard drinks per week. He reports that he does not use drugs.  No Known Allergies  Family History  Problem Relation Age of Onset   Cancer Mother     Prior to Admission medications   Medication Sig Start Date End Date Taking? Authorizing Provider  acetaminophen (TYLENOL) 650 MG CR tablet Take 650 mg by mouth every 8 (eight) hours as needed for pain.    [provider]  amLODipine (NORVASC) 10 MG tablet Take 10 mg by mouth daily.    [provider]  calcitRIOL (ROCALTROL) 0.25 MCG capsule Take 0.25 mcg by mouth daily.  08/20/19   [provider]  cetirizine (ZYRTEC) 10 MG tablet Take 10 mg by mouth daily.    [provider]  COVID-19 mRNA vaccine, Pfizer, 30 MCG/0.3ML injection INJECT AS DIRECTED 02/05/20 02/04/21  Carlyle Basques, MD  fluticasone [facility]) 50 MCG/ACT nasal spray Place 1 spray into the nose daily as needed for allergies.    [provider]  furosemide (LASIX) 40 MG tablet Take 1 tablet (40 mg total) by mouth daily. Patient taking differently: Take 40 mg by mouth 2 (two) times daily as needed for fluid or edema. 04/23/20   Little Ishikawa, MD  furosemide (LASIX) 40 MG tablet Take 1 tablet (40 mg total) by mouth daily as needed for fluid or edema (take after dinner nightly prn for edema or excess fluid). 04/22/20   Little Ishikawa, MD  Homeopathic Products (ARNICARE ARNICA) CREA Apply 1 application topically daily as needed (arthritis pain).    [provider]  hydrALAZINE (APRESOLINE) 25 MG tablet Take 25 mg by mouth 3 (three) times daily. 07/17/20   [provider]  metoprolol succinate (TOPROL-XL) 50 MG 24 hr tablet Take  50 mg by mouth at bedtime.  05/27/14   [provider]  oxyCODONE-acetaminophen (PERCOCET) 5-325 MG tablet Take 1 tablet by mouth every 4 (four) hours as needed for severe pain. 07/28/20 07/28/21  Baglia, Corrina, PA-C  tamsulosin (FLOMAX) 0.4 MG CAPS capsule Take 1 capsule (0.4 mg total) by mouth daily after supper. Patient taking differently: Take 0.4 mg by mouth in the morning and at bedtime. 10/09/19   Patrecia Pour, MD    Physical Exam:  Constitutional: Elderly male currently in no acute distress Vitals:   01/29/21 0824 01/29/21 0830 01/29/21 0930 01/29/21 1100  BP:   (!) 146/64 134/67  Pulse: 94 91 91 84  Resp: (!) 24 (!) 21 18 11   Temp:      TempSrc:  SpO2: 91% 97% 93% 90%  Weight:      Height:       Eyes: PERRL, lids and conjunctivae normal ENMT: Mucous membranes are moist. Posterior pharynx clear of any exudate or lesions.  Neck: normal, supple, no masses, no thyromegaly Respiratory: Mild bibasilar crackles appreciated with no significant wheezing or rhonchi.  O2 saturation currently maintained on 2 L of nasal cannula oxygen. Cardiovascular: Regular rate and rhythm, no murmurs / rubs / gallops. No extremity edema. 2+ pedal pulses. No carotid bruits.  Right upper extremity fistula. Abdomen: no tenderness, no masses palpated. No hepatosplenomegaly. Bowel sounds positive.  Musculoskeletal: no clubbing / cyanosis.  Left leg externally rotated and mildly short with tenderness to palpation even with minimal movement. Skin: Skin laceration noted to the left frontal scalp.  Neurologic: CN 2-12 grossly intact. Sensation intact, DTR normal. Strength 5/5 in all 4.  Psychiatric: Normal judgment and insight. Alert and oriented x 3. Normal mood.     Labs on Admission: I have personally reviewed following labs and imaging studies  CBC: Recent Labs  Lab 01/29/21 0840  WBC 15.6*  NEUTROABS 12.5*  HGB 11.1*  HCT 34.0*  MCV 100.0  PLT 494   Basic Metabolic Panel: Recent  Labs  Lab 01/29/21 0840  NA 135  K 4.1  CL 95*  CO2 27  GLUCOSE 119*  BUN 31*  CREATININE 3.29*  CALCIUM 9.6  MG 1.9  PHOS 4.2   GFR: Estimated Creatinine Clearance: 15 mL/min (A) (by C-G formula based on SCr of 3.29 mg/dL (H)). Liver Function Tests: Recent Labs  Lab 01/29/21 0840  AST 25  ALT 15  ALKPHOS 113  BILITOT 0.9  PROT 5.7*  ALBUMIN 3.1*   Recent Labs  Lab 01/29/21 0840  LIPASE 27   No results for input(s): AMMONIA in the last 168 hours. Coagulation Profile: Recent Labs  Lab 01/29/21 0840  INR 1.0   Cardiac Enzymes: No results for input(s): CKTOTAL, CKMB, CKMBINDEX, TROPONINI in the last 168 hours. BNP (last 3 results) No results for input(s): PROBNP in the last 8760 hours. HbA1C: No results for input(s): HGBA1C in the last 72 hours. CBG: No results for input(s): GLUCAP in the last 168 hours. Lipid Profile: No results for input(s): CHOL, HDL, LDLCALC, TRIG, CHOLHDL, LDLDIRECT in the last 72 hours. Thyroid Function Tests: No results for input(s): TSH, T4TOTAL, FREET4, T3FREE, THYROIDAB in the last 72 hours. Anemia Panel: No results for input(s): VITAMINB12, FOLATE, FERRITIN, TIBC, IRON, RETICCTPCT in the last 72 hours. Urine analysis:    Component Value Date/Time   COLORURINE YELLOW 04/20/2020 1819   APPEARANCEUR CLEAR 04/20/2020 1819   LABSPEC 1.011 04/20/2020 1819   PHURINE 5.0 04/20/2020 1819   GLUCOSEU NEGATIVE 04/20/2020 1819   HGBUR NEGATIVE 04/20/2020 1819   BILIRUBINUR NEGATIVE 04/20/2020 1819   KETONESUR NEGATIVE 04/20/2020 1819   PROTEINUR 100 (A) 04/20/2020 1819   NITRITE NEGATIVE 04/20/2020 1819   LEUKOCYTESUR NEGATIVE 04/20/2020 1819   Sepsis Labs: No results found for this or any previous visit (from the past 240 hour(s)).   Radiological Exams on Admission: DG Chest 1 View  Result Date: 01/29/2021 CLINICAL DATA:  87 year old male status post fall last night. EXAM: CHEST  1 VIEW COMPARISON:  Chest radiographs 04/21/2020  and earlier. FINDINGS: Portable AP supine views at 0903 hours. Chronic cardiomegaly appears stable. Calcified aortic atherosclerosis. Other mediastinal contours are within normal limits. Visualized tracheal air column is within normal limits. Increased pulmonary vascularity, slightly greater on the right.  Trace fluid in the right minor fissure now, but no other pleural effusion. No pneumothorax or confluent pulmonary opacity. No acute osseous abnormality identified. IMPRESSION: 1. Chronic cardiomegaly with increased pulmonary vascular congestion. Consider mild or developing interstitial edema. 2. No other acute cardiopulmonary abnormality. Electronically Signed   By: Genevie Ann M.D.   On: 01/29/2021 09:52   CT HEAD WO CONTRAST (5MM)  Result Date: 01/29/2021 CLINICAL DATA:  Fall today. EXAM: CT HEAD WITHOUT CONTRAST CT CERVICAL SPINE WITHOUT CONTRAST TECHNIQUE: Multidetector CT imaging of the head and cervical spine was performed following the standard protocol without intravenous contrast. Multiplanar CT image reconstructions of the cervical spine were also generated. COMPARISON:  None. FINDINGS: CT HEAD FINDINGS Brain: Mild diffuse cortical atrophy is noted. No mass effect or midline shift is noted. Ventricular size is within normal limits. There is no evidence of mass lesion, hemorrhage or acute infarction. Vascular: No hyperdense vessel or unexpected calcification. Skull: Normal. Negative for fracture or focal lesion. Sinuses/Orbits: No acute finding. Other: None. CT CERVICAL SPINE FINDINGS Alignment: Mild grade 1 anterolisthesis of C4-5 is noted secondary to posterior facet joint hypertrophy. Skull base and vertebrae: No acute fracture. No primary bone lesion or focal pathologic process. Soft tissues and spinal canal: No prevertebral fluid or swelling. No visible canal hematoma. Disc levels: Moderate degenerative disc disease is noted at C5-6 and C6-7 and C7-T1. Upper chest: Negative. Other: None. IMPRESSION:  No acute intracranial abnormality seen. Moderate multilevel degenerative disc disease. No acute abnormality seen in the cervical spine. Electronically Signed   By: Marijo Conception M.D.   On: 01/29/2021 10:38   CT Cervical Spine Wo Contrast  Result Date: 01/29/2021 CLINICAL DATA:  Fall today. EXAM: CT HEAD WITHOUT CONTRAST CT CERVICAL SPINE WITHOUT CONTRAST TECHNIQUE: Multidetector CT imaging of the head and cervical spine was performed following the standard protocol without intravenous contrast. Multiplanar CT image reconstructions of the cervical spine were also generated. COMPARISON:  None. FINDINGS: CT HEAD FINDINGS Brain: Mild diffuse cortical atrophy is noted. No mass effect or midline shift is noted. Ventricular size is within normal limits. There is no evidence of mass lesion, hemorrhage or acute infarction. Vascular: No hyperdense vessel or unexpected calcification. Skull: Normal. Negative for fracture or focal lesion. Sinuses/Orbits: No acute finding. Other: None. CT CERVICAL SPINE FINDINGS Alignment: Mild grade 1 anterolisthesis of C4-5 is noted secondary to posterior facet joint hypertrophy. Skull base and vertebrae: No acute fracture. No primary bone lesion or focal pathologic process. Soft tissues and spinal canal: No prevertebral fluid or swelling. No visible canal hematoma. Disc levels: Moderate degenerative disc disease is noted at C5-6 and C6-7 and C7-T1. Upper chest: Negative. Other: None. IMPRESSION: No acute intracranial abnormality seen. Moderate multilevel degenerative disc disease. No acute abnormality seen in the cervical spine. Electronically Signed   By: Marijo Conception M.D.   On: 01/29/2021 10:38   DG Hip Unilat W or Wo Pelvis 2-3 Views Left  Result Date: 01/29/2021 CLINICAL DATA:  Fall. EXAM: DG HIP (WITH OR WITHOUT PELVIS) 2-3V LEFT COMPARISON:  None. FINDINGS: Acute, impacted fracture deformity involves the femoral neck. There is medial angulation of the distal fracture  fragments. No dislocation. Right hip is intact. IMPRESSION: Acute, impacted fracture of the left femoral neck. Electronically Signed   By: Kerby Moors M.D.   On: 01/29/2021 09:54    EKG: Independently reviewed.  Atrial fibrillation at 91 bpm with question of new RBBB  Assessment/Plan Left femoral neck fracture and scalp laceration  2/2 fall: Patient presents after having what sounds like a mechanical home and home wall standing bending over.  Denies any loss of consciousness but did hit his head and suffered a scalp laceration.  X-rays noted in the left femoral neck fracture that was impacted.  Orthopedics planning on taking patient to surgery on 10/7. -Admit to surgical  telemetry bed -Hip fracture order set utilized -N.p.o. after midnight -Hydrocodone/Fentanyl IV as needed for pain -Wound care for scalp laceration -Transitions of care as patient likely need rehab.  Leukocytosis: Acute.  WBC elevated at 15.6.  Patient did meet SIRS criteria, but does not report any infectious symptoms.  Suspect white blood cell count elevated secondary to acute fracture. -Recheck CBC tomorrow morning  Respiratory failure with hypoxia: Acute.  Patient was noted to have drop in O2 saturations as low as 88% on room air in route with EMS. Chest x-ray did note some changes concerning for developing or mild interstitial edema.  Suspect possibly patient fluid overloaded due to need of hemodialysis, but the patient had also been given large dose of fentanyl which also could have been a contributor -Continuous pulse oximetry with nasal cannula oxygen maintain O2 saturation greater than 92%  ESRD on HD: Patient normally dialyzes on Tuesday, Thursday, and Saturday.  He is due for dialysis today.  Chest x-ray noted some signs of possible early interstitial edema. -HD per nephro -Appreciate nephrology consultative services, will follow-up for any further recommendation  Possible paroxysmal atrial fibrillation  right  bundle branch block: Acute. On admission patient's initial EKG gave concern for atrial fibrillation with a new right branch block.  Patient denies any complaints of chest pain or palpitations. CHA2DS2-VASc score = 4.  Repeat EKG noted to be within sinus rhythm. -Check echocardiogram -Follow-up telemetry overnight -May want to run EKG by Cardiology  Diastolic congestive heart failure: Acute on chronic.  Patient appears to be slightly fluid overloaded based off of chest x-ray.  Last echocardiogram revealed EF of 50 to 55% with grade 1 diastolic dysfunction back in 2021. -Fluid management with HD  Essential hypertension: Blood pressures currently maintained. -Continue metoprolol as tolerated  Anemia chronic kidney disease: Hemoglobin 11.1 g/dL which appears slightly less than previous of 12.2 g/dL in April of this year. -Continue to monitor  BPH with urinary retention: Chronic.  Patient normally self caths at home. -Continue Flomax -Foley catheter placement for surgical procedure -In-N-Out cath thereafter as needed  DVT prophylaxis: Heparin Code Status: Full Family Communication: Wife updated at bedside Disposition Plan: Likely need rehab Consults called: Orthopedics Admission status: Inpatient, require more than 2 midnight stay due to need of surgical procedure  Norval Morton MD [facility]   If 7PM-7AM, please contact night-coverage   01/29/2021, 11:17 AM

## 2021-01-29 NOTE — ED Provider Notes (Signed)
Emergency Department Provider Note   I have reviewed the triage vital signs and the nursing notes.   HISTORY  Chief Complaint Fall   HPI Jeffrey Payne is a 87 y.o. male with past medical history reviewed below presents to the emergency department after mechanical fall at home.  Patient was sitting on the edge of his bed trying to put on his pants when they got tangled and he fell to the floor.  He sustained an abrasion to the head but is mainly having pain in the left hip.  Describes the pain as severe, nonradiating, worse with touching/moving the area.  He is not having numbness in the extremities.  No lower back discomfort.  No loss of consciousness.  He denies any pain/pressure in the chest, palpitations, shortness of breath symptoms.  He is not anticoagulated. Tetanus not due again until 2030.  Patient is on hemodialysis and was due to have a session this morning.  Last dialysis was 2 days ago.    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   Allergic rhinitis due to pollen 09/09/2020   Gastroesophageal reflux disease 09/09/2020   Hyperlipidemia 09/09/2020   Hyperparathyroidism due to renal insufficiency ([place]) 09/09/2020   Osteoarthritis of knee 75/01/2584   Systolic murmur 27/78/2423   AKI (acute kidney injury) ([place]) 04/20/2020   Acute kidney injury superimposed on CKD ([place]) 10/06/2019   CKD (chronic kidney disease), stage IV ([place]) 10/05/2019   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[place]) 10/05/2019   Bradycardia 10/05/2019   Hypotension  10/05/2019   Fatigue 10/05/2019   Hypertension    Heart murmur     Past Surgical History:  Procedure Laterality Date   A/V FISTULAGRAM N/A 07/25/2020   Procedure: A/V FISTULAGRAM - Left Arm;  Surgeon: Elam Dutch, MD;  Location: [facility];  Service: Cardiovascular;  Laterality: N/A;   AV FISTULA PLACEMENT Left 03/04/2020   Procedure: BRACHIOCEPHALIC ARTERIOVENOUS (AV) FISTULA CREATION LEFT;  Surgeon: Elam Dutch, MD;  Location: [facility] OR;  Service: Vascular;  Laterality: Left;   AV FISTULA PLACEMENT Right 07/28/2020   Procedure: RIGHT  ARM ARTERIOVENOUS FISTULA CREATION;  Surgeon: Elam Dutch, MD;  Location: [facility] OR;  Service: Vascular;  Laterality: Right;   COLONOSCOPY     renal cyst biopsy     TONSILLECTOMY      Allergies Patient has no known allergies.  Family History  Problem Relation Age of Onset   Cancer Mother     Social History Social History   Tobacco Use   Smoking status: Never   Smokeless tobacco: Never  Vaping Use   Vaping Use: Never used  Substance Use Topics   Alcohol use: Yes    Alcohol/week: 14.0 standard drinks    Types: 14 Standard drinks or equivalent per week    Comment: 2 cocktails per day   Drug use: No    Review of Systems  Constitutional: No fever/chills  Eyes: No visual changes. ENT: No sore throat. Cardiovascular: Denies chest pain. Respiratory: Denies shortness of breath. Gastrointestinal: No abdominal pain.  No nausea, no vomiting.  No diarrhea.  No constipation. Genitourinary: Negative for dysuria. Musculoskeletal: Negative for back pain. Positive left hip pain.  Skin: Abrasion to the scalp.  Neurological: Negative for focal weakness or numbness. Positive mild frontal HA.   10-point ROS otherwise negative.  ____________________________________________   PHYSICAL EXAM:  VITAL SIGNS: ED Triage Vitals  Enc Vitals Group     BP 01/29/21 0818 (!) 148/65     Pulse Rate 01/29/21 0818 (!) 102     Resp 01/29/21 0818  (!) 29     Temp 01/29/21 0821 98.2 F (36.8 C)     Temp Source 01/29/21 0821 Oral     SpO2 01/29/21 0818 92 %     Weight 01/29/21 0823 145 lb (65.8 kg)     Height 01/29/21 0823 5\' 7"  (1.702 m)   Constitutional: Alert and oriented. Well appearing and in no acute distress. Eyes: Conjunctivae are normal. Head: Atraumatic. Nose: No congestion/rhinnorhea. Mouth/Throat: Mucous membranes are moist.  Neck: No stridor.  Cardiovascular: Tachycardia. Good peripheral circulation. Grossly normal heart sounds.   Respiratory: Normal respiratory effort.  No retractions. Lungs CTAB. Gastrointestinal: Soft and nontender. No distention.  Musculoskeletal: The left leg is shortened and externally rotated.  No tenderness to palpation of the left knee or ankle.  Exquisite tenderness with minimal movement of the left hip.  Normal range of motion of the right hip, knee, ankle.  Normal range of motion of the bilateral upper extremities. Neurologic:  Normal speech and language. No gross focal neurologic deficits are appreciated.  Skin: Superficial skin tear to the forehead with no active bleeding.  No deeper laceration.  Old bruising and skin tears to the left arm repaired with Steri-Strips.    ____________________________________________   LABS (all labs ordered are listed, but only abnormal results are displayed)  Labs Reviewed  COMPREHENSIVE METABOLIC PANEL - Abnormal; Notable for the following components:      Result Value   Chloride 95 (*)    Glucose, Bld 119 (*)    BUN 31 (*)    Creatinine, Ser 3.29 (*)    Total Protein 5.7 (*)    Albumin 3.1 (*)    GFR, Estimated 18 (*)    All other components within normal limits  CBC WITH DIFFERENTIAL/PLATELET - Abnormal; Notable for the following components:   WBC 15.6 (*)    RBC 3.40 (*)    Hemoglobin 11.1 (*)    HCT 34.0 (*)    Neutro Abs 12.5 (*)    Monocytes Absolute 1.7 (*)    Abs Immature Granulocytes 0.09 (*)    All other components within normal  limits  LIPASE, BLOOD  PROTIME-INR  MAGNESIUM  PHOSPHORUS   ____________________________________________  EKG   EKG Interpretation  Date/Time:  Thursday January 29 2021 08:19:58 EDT Ventricular Rate:  91 PR Interval:    QRS Duration: 130 QT Interval:  370 QTC Calculation: 456 R Axis:   -51 Text Interpretation: Atrial fibrillation Left bundle branch block New LBBB from 2021 tracing Confirmed by Nanda Quinton [phone]) on 01/29/2021 8:22:06 AM        ____________________________________________  RADIOLOGY  DG Chest 1 View  Result Date: 01/29/2021 CLINICAL DATA:  87 year old male status post fall last night. EXAM: CHEST  1 VIEW COMPARISON:  Chest radiographs 04/21/2020 and earlier. FINDINGS: Portable AP supine views at 0903 hours. Chronic cardiomegaly  appears stable. Calcified aortic atherosclerosis. Other mediastinal contours are within normal limits. Visualized tracheal air column is within normal limits. Increased pulmonary vascularity, slightly greater on the right. Trace fluid in the right minor fissure now, but no other pleural effusion. No pneumothorax or confluent pulmonary opacity. No acute osseous abnormality identified. IMPRESSION: 1. Chronic cardiomegaly with increased pulmonary vascular congestion. Consider mild or developing interstitial edema. 2. No other acute cardiopulmonary abnormality. Electronically Signed   By: Genevie Ann M.D.   On: 01/29/2021 09:52   CT HEAD WO CONTRAST (5MM)  Result Date: 01/29/2021 CLINICAL DATA:  Fall today. EXAM: CT HEAD WITHOUT CONTRAST CT CERVICAL SPINE WITHOUT CONTRAST TECHNIQUE: Multidetector CT imaging of the head and cervical spine was performed following the standard protocol without intravenous contrast. Multiplanar CT image reconstructions of the cervical spine were also generated. COMPARISON:  None. FINDINGS: CT HEAD FINDINGS Brain: Mild diffuse cortical atrophy is noted. No mass effect or midline shift is noted. Ventricular size is within  normal limits. There is no evidence of mass lesion, hemorrhage or acute infarction. Vascular: No hyperdense vessel or unexpected calcification. Skull: Normal. Negative for fracture or focal lesion. Sinuses/Orbits: No acute finding. Other: None. CT CERVICAL SPINE FINDINGS Alignment: Mild grade 1 anterolisthesis of C4-5 is noted secondary to posterior facet joint hypertrophy. Skull base and vertebrae: No acute fracture. No primary bone lesion or focal pathologic process. Soft tissues and spinal canal: No prevertebral fluid or swelling. No visible canal hematoma. Disc levels: Moderate degenerative disc disease is noted at C5-6 and C6-7 and C7-T1. Upper chest: Negative. Other: None. IMPRESSION: No acute intracranial abnormality seen. Moderate multilevel degenerative disc disease. No acute abnormality seen in the cervical spine. Electronically Signed   By: Marijo Conception M.D.   On: 01/29/2021 10:38   CT Cervical Spine Wo Contrast  Result Date: 01/29/2021 CLINICAL DATA:  Fall today. EXAM: CT HEAD WITHOUT CONTRAST CT CERVICAL SPINE WITHOUT CONTRAST TECHNIQUE: Multidetector CT imaging of the head and cervical spine was performed following the standard protocol without intravenous contrast. Multiplanar CT image reconstructions of the cervical spine were also generated. COMPARISON:  None. FINDINGS: CT HEAD FINDINGS Brain: Mild diffuse cortical atrophy is noted. No mass effect or midline shift is noted. Ventricular size is within normal limits. There is no evidence of mass lesion, hemorrhage or acute infarction. Vascular: No hyperdense vessel or unexpected calcification. Skull: Normal. Negative for fracture or focal lesion. Sinuses/Orbits: No acute finding. Other: None. CT CERVICAL SPINE FINDINGS Alignment: Mild grade 1 anterolisthesis of C4-5 is noted secondary to posterior facet joint hypertrophy. Skull base and vertebrae: No acute fracture. No primary bone lesion or focal pathologic process. Soft tissues and spinal  canal: No prevertebral fluid or swelling. No visible canal hematoma. Disc levels: Moderate degenerative disc disease is noted at C5-6 and C6-7 and C7-T1. Upper chest: Negative. Other: None. IMPRESSION: No acute intracranial abnormality seen. Moderate multilevel degenerative disc disease. No acute abnormality seen in the cervical spine. Electronically Signed   By: Marijo Conception M.D.   On: 01/29/2021 10:38   DG Hip Unilat W or Wo Pelvis 2-3 Views Left  Result Date: 01/29/2021 CLINICAL DATA:  Fall. EXAM: DG HIP (WITH OR WITHOUT PELVIS) 2-3V LEFT COMPARISON:  None. FINDINGS: Acute, impacted fracture deformity involves the femoral neck. There is medial angulation of the distal fracture fragments. No dislocation. Right hip is intact. IMPRESSION: Acute, impacted fracture of the left femoral neck. Electronically Signed   By: Kerby Moors M.D.   On:  01/29/2021 09:54    ____________________________________________   PROCEDURES  Procedure(s) performed:   Procedures  None  ____________________________________________   INITIAL IMPRESSION / ASSESSMENT AND PLAN / ED COURSE  Pertinent labs & imaging results that were available during my care of the patient were reviewed by me and considered in my medical decision making (see chart for details).   Patient presents emergency department with mechanical fall and mainly left hip pain.  Clinically, patient likely has a left hip fracture.  Will need to rule out dislocation.  Patient with mild head trauma after fall from the bedside with skin tear to the scalp.  I cleaned this area with iodine and saline.  Will allow to heal by secondary intention.  The skin is very thin and wound superficial and would not take suture/staple repair well. CT imaging of the head and c-spine ordered with concern for injury to these areas and unable to clear c-spine with concern for distracting injury. Labs pending anticipating routine HD and pre-op clearance. COVID test ordered as  well. Patient was placed on 2L [place] with EMS with temporary drop in O2 sat to the high 80s. Ordered CXR for evaluation of CAP vs pulmonary edema but possibly related to chronic disease vs fentanyl administration with EMS.   Reviewed labs and plain films along with CT imaging.  Patient has a left femoral neck fracture on plain film which correlates with his exam.  Discussed with Legrand Como from orthopedics to be down to evaluate.  Will discuss case with the hospitalist service.  Patient has some mild edema on his chest x-ray and is due for dialysis today.  He is not in acute respiratory distress, altered, or having significant lab abnormalities to prompt emergent dialysis.   Discussed patient's case with [facility] to request admission. Patient and family (if present) updated with plan. Care transferred to [facility] service.  I reviewed all nursing notes, vitals, pertinent old records, EKGs, labs, imaging (as available).  ____________________________________________  FINAL CLINICAL IMPRESSION(S) / ED DIAGNOSES  Final diagnoses:  Fall  Closed fracture of left hip, initial encounter ([place])  Fall, initial encounter  Injury of head, initial encounter    MEDICATIONS GIVEN DURING THIS VISIT:  Medications  fentaNYL (SUBLIMAZE) injection 100 mcg (100 mcg Intravenous Given 01/29/21 0852)     Note:  This document was prepared using Dragon voice recognition software and may include unintentional dictation errors.  Nanda Quinton, MD, [facility]    Soniya Ashraf, [facility], MD 01/31/21 [phone]

## 2021-01-29 NOTE — Consult Note (Signed)
Reason for Consult:Left hip fx Referring Physician: Nanda Quinton Time called: 1039 Time at bedside: Jeffrey Payne is an 87 y.o. male.  HPI: Jeffrey Payne was trying to put some pants on early this morning when he lost his balance and fell. He had immediate left hip pain but was able to get back to bed with his wife's help. He was still in significant pain later this morning and was brought to the ED for evaluation. X-rays showed left left hip fx and orthopedic surgery was consulted. He lives at home with his wife and ambulates with a RW.  Past Medical History:  Diagnosis Date   Anemia    Arthritis    knees   Cancer (HCC)    prostate   CKD (chronic kidney disease) stage 3, GFR 30-59 ml/min (HCC)    Dyspnea    increasing due  fluid   ED (erectile dysfunction)    GERD (gastroesophageal reflux disease)    ocassional    Hard of hearing    Heart murmur    Hypertension    Hypertriglyceridemia    Rosacea     Past Surgical History:  Procedure Laterality Date   A/V FISTULAGRAM N/A 07/25/2020   Procedure: A/V FISTULAGRAM - Left Arm;  Surgeon: Elam Dutch, MD;  Location: [facility];  Service: Cardiovascular;  Laterality: N/A;   AV FISTULA PLACEMENT Left 03/04/2020   Procedure: BRACHIOCEPHALIC ARTERIOVENOUS (AV) FISTULA CREATION LEFT;  Surgeon: Elam Dutch, MD;  Location: [facility] OR;  Service: Vascular;  Laterality: Left;   AV FISTULA PLACEMENT Right 07/28/2020   Procedure: RIGHT  ARM ARTERIOVENOUS FISTULA CREATION;  Surgeon: Elam Dutch, MD;  Location: [facility] OR;  Service: Vascular;  Laterality: Right;   COLONOSCOPY     renal cyst biopsy     TONSILLECTOMY      Family History  Problem Relation Age of Onset   Cancer Mother     Social History:  reports that he has never smoked. He has never used smokeless tobacco. He reports current alcohol use of about 14.0 standard drinks per week. He reports that he does not use drugs.  Allergies: No Known Allergies  Medications: I  have reviewed the patient's current medications.  Results for orders placed or performed during the hospital encounter of 01/29/21 (from the past 48 hour(s))  Comprehensive metabolic panel     Status: Abnormal   Collection Time: 01/29/21  8:40 AM  Result Value Ref Range   Sodium 135 135 - 145 mmol/L   Potassium 4.1 3.5 - 5.1 mmol/L   Chloride 95 (L) 98 - 111 mmol/L   CO2 27 22 - 32 mmol/L   Glucose, Bld 119 (H) 70 - 99 mg/dL    Comment: Glucose reference range applies only to samples taken after fasting for at least 8 hours.   BUN 31 (H) 8 - 23 mg/dL   Creatinine, Ser 3.29 (H) 0.61 - 1.24 mg/dL   Calcium 9.6 8.9 - 10.3 mg/dL   Total Protein 5.7 (L) 6.5 - 8.1 g/dL   Albumin 3.1 (L) 3.5 - 5.0 g/dL   AST 25 15 - 41 U/L   ALT 15 0 - 44 U/L   Alkaline Phosphatase 113 38 - 126 U/L   Total Bilirubin 0.9 0.3 - 1.2 mg/dL   GFR, Estimated 18 (L) >60 mL/min    Comment: (NOTE) Calculated using the CKD-EPI Creatinine Equation (2021)    Anion gap 13 5 - 15    Comment:  Performed at [facility], [place] [address], [place], [place] [address]  Lipase, blood     Status: None   Collection Time: 01/29/21  8:40 AM  Result Value Ref Range   Lipase 27 11 - 51 U/L    Comment: Performed at [facility] [address] [place], [place] [address]  CBC with Differential     Status: Abnormal   Collection Time: 01/29/21  8:40 AM  Result Value Ref Range   WBC 15.6 (H) 4.0 - 10.5 K/uL   RBC 3.40 (L) 4.22 - 5.81 MIL/uL   Hemoglobin 11.1 (L) 13.0 - 17.0 g/dL   HCT 34.0 (L) 39.0 - 52.0 %   MCV 100.0 80.0 - 100.0 fL   MCH 32.6 26.0 - 34.0 pg   MCHC 32.6 30.0 - 36.0 g/dL   RDW 13.9 11.5 - 15.5 %   Platelets 276 150 - 400 K/uL   nRBC 0.0 0.0 - 0.2 %   Neutrophils Relative % 79 %   Neutro Abs 12.5 (H) 1.7 - 7.7 K/uL   Lymphocytes Relative 9 %   Lymphs Abs 1.4 0.7 - 4.0 K/uL   Monocytes Relative 11 %   Monocytes Absolute 1.7 (H) 0.1 - 1.0 K/uL   Eosinophils Relative 0 %   Eosinophils Absolute  0.0 0.0 - 0.5 K/uL   Basophils Relative 0 %   Basophils Absolute 0.0 0.0 - 0.1 K/uL   Immature Granulocytes 1 %   Abs Immature Granulocytes 0.09 (H) 0.00 - 0.07 K/uL    Comment: Performed at [facility], [address] [place], [place] [address]  Protime-INR     Status: None   Collection Time: 01/29/21  8:40 AM  Result Value Ref Range   Prothrombin Time 12.8 11.4 - 15.2 seconds   INR 1.0 0.8 - 1.2    Comment: (NOTE) INR goal varies based on device and disease states. Performed at [facility], [place] [address] [place], [place] [address]   Magnesium     Status: None   Collection Time: 01/29/21  8:40 AM  Result Value Ref Range   Magnesium 1.9 1.7 - 2.4 mg/dL    Comment: Performed at [facility] [address], [place], [place] [address]  Phosphorus     Status: None   Collection Time: 01/29/21  8:40 AM  Result Value Ref Range   Phosphorus 4.2 2.5 - 4.6 mg/dL    Comment: Performed at [facility] [address] [place], [place] [address]    DG Chest 1 View  Result Date: 01/29/2021 CLINICAL DATA:  87 year old male status post fall last night. EXAM: CHEST  1 VIEW COMPARISON:  Chest radiographs 04/21/2020 and earlier. FINDINGS: Portable AP supine views at 0903 hours. Chronic cardiomegaly appears stable. Calcified aortic atherosclerosis. Other mediastinal contours are within normal limits. Visualized tracheal air column is within normal limits. Increased pulmonary vascularity, slightly greater on the right. Trace fluid in the right minor fissure now, but no other pleural effusion. No pneumothorax or confluent pulmonary opacity. No acute osseous abnormality identified. IMPRESSION: 1. Chronic cardiomegaly with increased pulmonary vascular congestion. Consider mild or developing interstitial edema. 2. No other acute cardiopulmonary abnormality. Electronically Signed   By: Genevie Ann M.D.   On: 01/29/2021 09:52   CT HEAD WO CONTRAST (5MM)  Result Date:  01/29/2021 CLINICAL DATA:  Fall today. EXAM: CT HEAD WITHOUT CONTRAST CT CERVICAL SPINE WITHOUT CONTRAST TECHNIQUE: Multidetector CT imaging of the  head and cervical spine was performed following the standard protocol without intravenous contrast. Multiplanar CT image reconstructions of the cervical spine were also generated. COMPARISON:  None. FINDINGS: CT HEAD FINDINGS Brain: Mild diffuse cortical atrophy is noted. No mass effect or midline shift is noted. Ventricular size is within normal limits. There is no evidence of mass lesion, hemorrhage or acute infarction. Vascular: No hyperdense vessel or unexpected calcification. Skull: Normal. Negative for fracture or focal lesion. Sinuses/Orbits: No acute finding. Other: None. CT CERVICAL SPINE FINDINGS Alignment: Mild grade 1 anterolisthesis of C4-5 is noted secondary to posterior facet joint hypertrophy. Skull base and vertebrae: No acute fracture. No primary bone lesion or focal pathologic process. Soft tissues and spinal canal: No prevertebral fluid or swelling. No visible canal hematoma. Disc levels: Moderate degenerative disc disease is noted at C5-6 and C6-7 and C7-T1. Upper chest: Negative. Other: None. IMPRESSION: No acute intracranial abnormality seen. Moderate multilevel degenerative disc disease. No acute abnormality seen in the cervical spine. Electronically Signed   By: Marijo Conception M.D.   On: 01/29/2021 10:38   CT Cervical Spine Wo Contrast  Result Date: 01/29/2021 CLINICAL DATA:  Fall today. EXAM: CT HEAD WITHOUT CONTRAST CT CERVICAL SPINE WITHOUT CONTRAST TECHNIQUE: Multidetector CT imaging of the head and cervical spine was performed following the standard protocol without intravenous contrast. Multiplanar CT image reconstructions of the cervical spine were also generated. COMPARISON:  None. FINDINGS: CT HEAD FINDINGS Brain: Mild diffuse cortical atrophy is noted. No mass effect or midline shift is noted. Ventricular size is within normal  limits. There is no evidence of mass lesion, hemorrhage or acute infarction. Vascular: No hyperdense vessel or unexpected calcification. Skull: Normal. Negative for fracture or focal lesion. Sinuses/Orbits: No acute finding. Other: None. CT CERVICAL SPINE FINDINGS Alignment: Mild grade 1 anterolisthesis of C4-5 is noted secondary to posterior facet joint hypertrophy. Skull base and vertebrae: No acute fracture. No primary bone lesion or focal pathologic process. Soft tissues and spinal canal: No prevertebral fluid or swelling. No visible canal hematoma. Disc levels: Moderate degenerative disc disease is noted at C5-6 and C6-7 and C7-T1. Upper chest: Negative. Other: None. IMPRESSION: No acute intracranial abnormality seen. Moderate multilevel degenerative disc disease. No acute abnormality seen in the cervical spine. Electronically Signed   By: Marijo Conception M.D.   On: 01/29/2021 10:38   DG Hip Unilat W or Wo Pelvis 2-3 Views Left  Result Date: 01/29/2021 CLINICAL DATA:  Fall. EXAM: DG HIP (WITH OR WITHOUT PELVIS) 2-3V LEFT COMPARISON:  None. FINDINGS: Acute, impacted fracture deformity involves the femoral neck. There is medial angulation of the distal fracture fragments. No dislocation. Right hip is intact. IMPRESSION: Acute, impacted fracture of the left femoral neck. Electronically Signed   By: Kerby Moors M.D.   On: 01/29/2021 09:54    Review of Systems  HENT:  Negative for ear discharge, ear pain, hearing loss and tinnitus.   Eyes:  Negative for photophobia and pain.  Respiratory:  Negative for cough and shortness of breath.   Cardiovascular:  Negative for chest pain.  Gastrointestinal:  Negative for abdominal pain, nausea and vomiting.  Genitourinary:  Negative for dysuria, flank pain, frequency and urgency.  Musculoskeletal:  Positive for arthralgias (Left hip). Negative for back pain, myalgias and neck pain.  Neurological:  Negative for dizziness and headaches.  Hematological:  Does not  bruise/bleed easily.  Psychiatric/Behavioral:  The patient is not nervous/anxious.   Blood pressure (!) 140/57, pulse 88, temperature 98.2 F (  36.8 C), temperature source Oral, resp. rate 18, height 5\' 7"  (1.702 m), weight 65.8 kg, SpO2 94 %. Physical Exam Constitutional:      General: He is not in acute distress.    Appearance: He is well-developed. He is not diaphoretic.  HENT:     Head: Normocephalic and atraumatic.  Eyes:     General: No scleral icterus.       Right eye: No discharge.        Left eye: No discharge.     Conjunctiva/sclera: Conjunctivae normal.  Cardiovascular:     Rate and Rhythm: Normal rate and regular rhythm.  Pulmonary:     Effort: Pulmonary effort is normal. No respiratory distress.  Musculoskeletal:     Cervical back: Normal range of motion.     Comments: LLE No traumatic wounds, ecchymosis, or rash  Mild TTP  No knee or ankle effusion  Knee stable to varus/ valgus and anterior/posterior stress  Sens DPN, SPN, TN intact  Motor EHL, ext, flex, evers 5/5  DP 1+, PT 1+, No significant edema  Skin:    General: Skin is warm and dry.  Neurological:     Mental Status: He is alert.  Psychiatric:        Mood and Affect: Mood normal.        Behavior: Behavior normal.    Assessment/Plan: Left hip fx -- Plan hip hemi tomorrow with Dr. Sammuel Hines. Please keep NPO after MN. Multiple medical problems including ESRD on HD -- Medicine to admit and clear. Appreciate their help.    Lisette Abu, PA-C Orthopedic Surgery [phone] 01/29/2021, 11:36 AM

## 2021-01-29 NOTE — Consult Note (Signed)
[facility] Nurse Consult Note: Patient receiving care in [facility] ED1. Reason for Consult: scalp laceration Wound type: trauma injury to forehead Pressure Injury POA: Yes/No/NA Measurement: Wound bed: see photo Drainage (amount, consistency, odor)  Periwound: Dressing procedure/placement/frequency: Apply mupirocin ointment to forehead injury, cover with a small foam dressing.  Monitor the wound area(s) for worsening of condition such as: Signs/symptoms of infection,  Increase in size,  Development of or worsening of odor, Development of pain, or increased pain at the affected locations.  Notify the medical team if any of these develop.  Thank you for the consult. [facility] nurse will not follow at this time.  Please re-consult the [facility] team if needed.  Val Riles, RN, MSN, CWOCN, CNS-BC, pager [phone]

## 2021-01-29 NOTE — ED Triage Notes (Signed)
Pt comes from home via EMS. Pt was upstairs in bed when EMS arrived. Called out for fall at 0130 this morning when attempting to change his pants. Pt was able to get himself back in bed but woke with severe pain in left hip this morning. EMS noticed shortening and rotation with possible deformity to left hip. Pt a/o due for dialysis today. 22g IV established in LFA. 150 mcg of fentanyl administered. O2 dropped to 88%, pt placed on 2L [place], O2 at 96%. No mental status decline. BP 171-66 HR 90 RR 20-24 O2 96% 2L [place]

## 2021-01-29 NOTE — Consult Note (Signed)
ORTHOPAEDIC CONSULTATION  REQUESTING PHYSICIAN: Norval Morton, MD  Chief Complaint: Displaced left femoral neck fracture  HPI: Jeffrey Payne is a 87 y.o. male who presents with with a displaced left femoral neck fracture after a fall while attempting to loosen his pants at the side of his bed.  History was collected with his wife who is at the bedside.  He has been having increased number of falls in the last month with a total of 3.  As such they have obtained a walker which she now uses to ambulate about the house and outside of the house.  Denies any history of left hip pain or osteoarthritis.  He is not on any anticoagulation.  He is on dialysis for kidney disease.  He was supposed to have a session today  Past Medical History:  Diagnosis Date   Anemia    Arthritis    knees   Cancer ([place])    prostate   CKD (chronic kidney disease) stage 3, GFR 30-59 ml/min (HCC)    Dyspnea    increasing due  fluid   ED (erectile dysfunction)    GERD (gastroesophageal reflux disease)    ocassional    Hard of hearing    Heart murmur    Hypertension    Hypertriglyceridemia    Rosacea    Past Surgical History:  Procedure Laterality Date   A/V FISTULAGRAM N/A 07/25/2020   Procedure: A/V FISTULAGRAM - Left Arm;  Surgeon: Elam Dutch, MD;  Location: [facility];  Service: Cardiovascular;  Laterality: N/A;   AV FISTULA PLACEMENT Left 03/04/2020   Procedure: BRACHIOCEPHALIC ARTERIOVENOUS (AV) FISTULA CREATION LEFT;  Surgeon: Elam Dutch, MD;  Location: [facility] OR;  Service: Vascular;  Laterality: Left;   AV FISTULA PLACEMENT Right 07/28/2020   Procedure: RIGHT  ARM ARTERIOVENOUS FISTULA CREATION;  Surgeon: Elam Dutch, MD;  Location: [facility] OR;  Service: Vascular;  Laterality: Right;   COLONOSCOPY     renal cyst biopsy     TONSILLECTOMY     Social History   Socioeconomic History   Marital status: Married    Spouse name: Not on file   Number of children: Not on file   Years  of education: Not on file   Highest education level: Not on file  Occupational History   Not on file  Tobacco Use   Smoking status: Never   Smokeless tobacco: Never  Vaping Use   Vaping Use: Never used  Substance and Sexual Activity   Alcohol use: Yes    Alcohol/week: 14.0 standard drinks    Types: 14 Standard drinks or equivalent per week    Comment: 2 cocktails per day   Drug use: No   Sexual activity: Not on file  Other Topics Concern   Not on file  Social History Narrative   Not on file   Social Determinants of Health   Financial Resource Strain: Not on file  Food Insecurity: Not on file  Transportation Needs: Not on file  Physical Activity: Not on file  Stress: Not on file  Social Connections: Not on file   Family History  Problem Relation Age of Onset   Cancer Mother    - negative except otherwise stated in the family history section No Known Allergies Prior to Admission medications   Medication Sig Start Date End Date Taking? Authorizing Provider  acetaminophen (TYLENOL) 650 MG CR tablet Take 650 mg by mouth every 8 (eight) hours as needed for pain.  [provider]  amLODipine (NORVASC) 10 MG tablet Take 10 mg by mouth daily.    [provider]  calcitRIOL (ROCALTROL) 0.25 MCG capsule Take 0.25 mcg by mouth daily.  08/20/19   [provider]  cetirizine (ZYRTEC) 10 MG tablet Take 10 mg by mouth daily.    [provider]  COVID-19 mRNA vaccine, Pfizer, 30 MCG/0.3ML injection INJECT AS DIRECTED 02/05/20 02/04/21  Carlyle Basques, MD  fluticasone [facility]) 50 MCG/ACT nasal spray Place 1 spray into the nose daily as needed for allergies.    [provider]  furosemide (LASIX) 40 MG tablet Take 1 tablet (40 mg total) by mouth daily. Patient taking differently: Take 40 mg by mouth 2 (two) times daily as needed for fluid or edema. 04/23/20   Little Ishikawa, MD  furosemide (LASIX) 40 MG tablet Take 1 tablet (40 mg  total) by mouth daily as needed for fluid or edema (take after dinner nightly prn for edema or excess fluid). 04/22/20   Little Ishikawa, MD  Homeopathic Products ([facility]) CREA Apply 1 application topically daily as needed (arthritis pain).    [provider]  hydrALAZINE (APRESOLINE) 25 MG tablet Take 25 mg by mouth 3 (three) times daily. 07/17/20   [provider]  metoprolol succinate (TOPROL-XL) 50 MG 24 hr tablet Take 50 mg by mouth at bedtime.  05/27/14   [provider]  oxyCODONE-acetaminophen (PERCOCET) 5-325 MG tablet Take 1 tablet by mouth every 4 (four) hours as needed for severe pain. 07/28/20 07/28/21  Baglia, Corrina, PA-C  tamsulosin (FLOMAX) 0.4 MG CAPS capsule Take 1 capsule (0.4 mg total) by mouth daily after supper. Patient taking differently: Take 0.4 mg by mouth in the morning and at bedtime. 10/09/19   Patrecia Pour, MD   DG Chest 1 View  Result Date: 01/29/2021 CLINICAL DATA:  87 year old male status post fall last night. EXAM: CHEST  1 VIEW COMPARISON:  Chest radiographs 04/21/2020 and earlier. FINDINGS: Portable AP supine views at 0903 hours. Chronic cardiomegaly appears stable. Calcified aortic atherosclerosis. Other mediastinal contours are within normal limits. Visualized tracheal air column is within normal limits. Increased pulmonary vascularity, slightly greater on the right. Trace fluid in the right minor fissure now, but no other pleural effusion. No pneumothorax or confluent pulmonary opacity. No acute osseous abnormality identified. IMPRESSION: 1. Chronic cardiomegaly with increased pulmonary vascular congestion. Consider mild or developing interstitial edema. 2. No other acute cardiopulmonary abnormality. Electronically Signed   By: Genevie Ann M.D.   On: 01/29/2021 09:52   CT HEAD WO CONTRAST (5MM)  Result Date: 01/29/2021 CLINICAL DATA:  Fall today. EXAM: CT HEAD WITHOUT CONTRAST CT CERVICAL SPINE WITHOUT CONTRAST TECHNIQUE:  Multidetector CT imaging of the head and cervical spine was performed following the standard protocol without intravenous contrast. Multiplanar CT image reconstructions of the cervical spine were also generated. COMPARISON:  None. FINDINGS: CT HEAD FINDINGS Brain: Mild diffuse cortical atrophy is noted. No mass effect or midline shift is noted. Ventricular size is within normal limits. There is no evidence of mass lesion, hemorrhage or acute infarction. Vascular: No hyperdense vessel or unexpected calcification. Skull: Normal. Negative for fracture or focal lesion. Sinuses/Orbits: No acute finding. Other: None. CT CERVICAL SPINE FINDINGS Alignment: Mild grade 1 anterolisthesis of C4-5 is noted secondary to posterior facet joint hypertrophy. Skull base and vertebrae: No acute fracture. No primary bone lesion or focal pathologic process. Soft tissues and spinal canal: No prevertebral fluid or swelling. No visible  canal hematoma. Disc levels: Moderate degenerative disc disease is noted at C5-6 and C6-7 and C7-T1. Upper chest: Negative. Other: None. IMPRESSION: No acute intracranial abnormality seen. Moderate multilevel degenerative disc disease. No acute abnormality seen in the cervical spine. Electronically Signed   By: Marijo Conception M.D.   On: 01/29/2021 10:38   CT Cervical Spine Wo Contrast  Result Date: 01/29/2021 CLINICAL DATA:  Fall today. EXAM: CT HEAD WITHOUT CONTRAST CT CERVICAL SPINE WITHOUT CONTRAST TECHNIQUE: Multidetector CT imaging of the head and cervical spine was performed following the standard protocol without intravenous contrast. Multiplanar CT image reconstructions of the cervical spine were also generated. COMPARISON:  None. FINDINGS: CT HEAD FINDINGS Brain: Mild diffuse cortical atrophy is noted. No mass effect or midline shift is noted. Ventricular size is within normal limits. There is no evidence of mass lesion, hemorrhage or acute infarction. Vascular: No hyperdense vessel or  unexpected calcification. Skull: Normal. Negative for fracture or focal lesion. Sinuses/Orbits: No acute finding. Other: None. CT CERVICAL SPINE FINDINGS Alignment: Mild grade 1 anterolisthesis of C4-5 is noted secondary to posterior facet joint hypertrophy. Skull base and vertebrae: No acute fracture. No primary bone lesion or focal pathologic process. Soft tissues and spinal canal: No prevertebral fluid or swelling. No visible canal hematoma. Disc levels: Moderate degenerative disc disease is noted at C5-6 and C6-7 and C7-T1. Upper chest: Negative. Other: None. IMPRESSION: No acute intracranial abnormality seen. Moderate multilevel degenerative disc disease. No acute abnormality seen in the cervical spine. Electronically Signed   By: Marijo Conception M.D.   On: 01/29/2021 10:38   DG Hip Unilat W or Wo Pelvis 2-3 Views Left  Result Date: 01/29/2021 CLINICAL DATA:  Fall. EXAM: DG HIP (WITH OR WITHOUT PELVIS) 2-3V LEFT COMPARISON:  None. FINDINGS: Acute, impacted fracture deformity involves the femoral neck. There is medial angulation of the distal fracture fragments. No dislocation. Right hip is intact. IMPRESSION: Acute, impacted fracture of the left femoral neck. Electronically Signed   By: Kerby Moors M.D.   On: 01/29/2021 09:54     Positive ROS: All other systems have been reviewed and were otherwise negative with the exception of those mentioned in the HPI and as above.  Physical Exam: General: No acute distress Cardiovascular: No pedal edema Respiratory: No cyanosis, no use of accessory musculature GI: No organomegaly, abdomen is soft and non-tender Skin: No lesions in the area of chief complaint Neurologic: Sensation intact distally Psychiatric: Patient is at baseline mood and affect Lymphatic: No axillary or cervical lymphadenopathy  MUSCULOSKELETAL:  Left hip is shortened externally rotated.  Pain with any type of logroll.  He is able to dorsiflex and plantarflex the left foot.   Sensation is intact in all distributions of the left foot.  2+ dorsalis pedis pulse  Independent Imaging Review: Displaced left femoral neck fracture in varus compared to the contralateral side  Assessment: 87 year old male with a displaced left femoral neck fracture after a fall from off his bed.  I have spoken with both him and his wife and advised that ultimately I would recommend surgical intervention in order to optimize his mobility status and to prevent the complications that come with being bedbound.  We discussed specific complications including infection, dislocation, need for repeat surgery.  They understand this and would like to proceed  Plan: -Plan for left hip hemiarthroplasty  After a lengthy discussion of treatment options, including risks, benefits, alternatives, complications of surgical and nonsurgical conservative options, the patient elected surgical repair.  The patient  is aware of the material risks  and complications including, but not limited to injury to adjacent structures, neurovascular injury, infection, numbness, bleeding, implant failure, thermal burns, stiffness, persistent pain, failure to heal, disease transmission from allograft, need for further surgery, dislocation, anesthetic risks, blood clots, risks of death,and others. The probabilities of surgical success and failure discussed with patient given their particular co-morbidities.The time and nature of expected rehabilitation and recovery was discussed.The patient's questions were all answered preoperatively.  No barriers to understanding were noted. I explained the natural history of the disease process and Rx rationale.  I explained to the patient what I considered to be reasonable expectations given their personal situation.  The final treatment plan was arrived at through a shared patient decision making process model.   Thank you for the consult and the opportunity to see Mr. Landing  Vanetta Mulders,  MD [facility] 12:43 PM

## 2021-01-29 NOTE — ED Notes (Signed)
Bladder scanned showed 277ml. Foley placed

## 2021-01-30 ENCOUNTER — Inpatient Hospital Stay (HOSPITAL_COMMUNITY): Payer: Medicare Other

## 2021-01-30 ENCOUNTER — Encounter (HOSPITAL_COMMUNITY): Payer: Self-pay | Admitting: Internal Medicine

## 2021-01-30 ENCOUNTER — Inpatient Hospital Stay (HOSPITAL_COMMUNITY): Payer: Medicare Other | Admitting: Registered Nurse

## 2021-01-30 ENCOUNTER — Encounter (HOSPITAL_COMMUNITY)
Admission: EM | Disposition: A | Discharge status: Skilled Nursing Facility | Payer: Self-pay | Source: Home / Self Care | Attending: Family Medicine

## 2021-01-30 ENCOUNTER — Other Ambulatory Visit (HOSPITAL_COMMUNITY): Payer: Medicare Other

## 2021-01-30 DIAGNOSIS — I447 Left bundle-branch block, unspecified: Secondary | ICD-10-CM | POA: Diagnosis present

## 2021-01-30 DIAGNOSIS — S0101XA Laceration without foreign body of scalp, initial encounter: Secondary | ICD-10-CM

## 2021-01-30 DIAGNOSIS — R0902 Hypoxemia: Secondary | ICD-10-CM | POA: Diagnosis present

## 2021-01-30 DIAGNOSIS — S72002A Fracture of unspecified part of neck of left femur, initial encounter for closed fracture: Secondary | ICD-10-CM | POA: Diagnosis not present

## 2021-01-30 DIAGNOSIS — I5033 Acute on chronic diastolic (congestive) heart failure: Secondary | ICD-10-CM | POA: Insufficient documentation

## 2021-01-30 DIAGNOSIS — I471 Supraventricular tachycardia: Secondary | ICD-10-CM | POA: Diagnosis present

## 2021-01-30 DIAGNOSIS — N186 End stage renal disease: Secondary | ICD-10-CM | POA: Diagnosis present

## 2021-01-30 HISTORY — DX: Fracture of unspecified part of neck of left femur, initial encounter for closed fracture: S72.002A

## 2021-01-30 HISTORY — PX: HIP ARTHROPLASTY: SHX981

## 2021-01-30 HISTORY — DX: Laceration without foreign body of scalp, initial encounter: S01.01XA

## 2021-01-30 LAB — RENAL FUNCTION PANEL
Albumin: 3 g/dL — ABNORMAL LOW (ref 3.5–5.0)
Anion gap: 13 (ref 5–15)
BUN: 35 mg/dL — ABNORMAL HIGH (ref 8–23)
CO2: 26 mmol/L (ref 22–32)
Calcium: 10 mg/dL (ref 8.9–10.3)
Chloride: 95 mmol/L — ABNORMAL LOW (ref 98–111)
Creatinine, Ser: 3.44 mg/dL — ABNORMAL HIGH (ref 0.61–1.24)
GFR, Estimated: 17 mL/min — ABNORMAL LOW (ref >60)
Glucose, Bld: 96 mg/dL (ref 70–99)
Phosphorus: 5 mg/dL — ABNORMAL HIGH (ref 2.5–4.6)
Potassium: 3.7 mmol/L (ref 3.5–5.1)
Sodium: 134 mmol/L — ABNORMAL LOW (ref 135–145)

## 2021-01-30 LAB — CBC
HCT: 33.6 % — ABNORMAL LOW (ref 39.0–52.0)
Hemoglobin: 11.1 g/dL — ABNORMAL LOW (ref 13.0–17.0)
MCH: 32.6 pg (ref 26.0–34.0)
MCHC: 33 g/dL (ref 30.0–36.0)
MCV: 98.8 fL (ref 80.0–100.0)
Platelets: 272 10*3/uL (ref 150–400)
RBC: 3.4 MIL/uL — ABNORMAL LOW (ref 4.22–5.81)
RDW: 14.1 % (ref 11.5–15.5)
WBC: 12.6 10*3/uL — ABNORMAL HIGH (ref 4.0–10.5)
nRBC: 0 % (ref 0.0–0.2)

## 2021-01-30 LAB — HEPATITIS B SURFACE ANTIBODY,QUALITATIVE: Hep B S Ab: NONREACTIVE

## 2021-01-30 LAB — MRSA NEXT GEN BY PCR, NASAL: MRSA by PCR Next Gen: NOT DETECTED

## 2021-01-30 SURGERY — HEMIARTHROPLASTY, HIP, DIRECT ANTERIOR APPROACH, FOR FRACTURE
Anesthesia: General | Site: Hip | Laterality: Left

## 2021-01-30 MED ORDER — SODIUM CHLORIDE 0.9 % IV SOLN
INTRAVENOUS | Status: DC
Start: 2021-01-30 — End: 2021-01-30

## 2021-01-30 MED ORDER — TRANEXAMIC ACID-NACL 1000-0.7 MG/100ML-% IV SOLN
1000.0000 mg | INTRAVENOUS | Status: AC
  Administered 2021-01-30: 1000 mg via INTRAVENOUS
  Filled 2021-01-30: qty 100

## 2021-01-30 MED ORDER — FENTANYL CITRATE (PF) 100 MCG/2ML IJ SOLN: 25.0000 ug | INTRAMUSCULAR | Status: DC | PRN

## 2021-01-30 MED ORDER — ONDANSETRON HCL 4 MG/2ML IJ SOLN
INTRAMUSCULAR | Status: DC | PRN
  Administered 2021-01-30: 4 mg via INTRAVENOUS

## 2021-01-30 MED ORDER — CEFAZOLIN SODIUM-DEXTROSE 2-4 GM/100ML-% IV SOLN
2.0000 g | Freq: Two times a day (BID) | INTRAVENOUS | Status: AC
  Administered 2021-01-30 – 2021-01-31 (×2): 2 g via INTRAVENOUS
  Filled 2021-01-30 (×2): qty 100

## 2021-01-30 MED ORDER — PROPOFOL 10 MG/ML IV BOLUS
INTRAVENOUS | Status: AC
  Filled 2021-01-30: qty 20

## 2021-01-30 MED ORDER — SODIUM CHLORIDE 0.9 % IR SOLN
Status: DC | PRN
  Administered 2021-01-30: 3000 mL

## 2021-01-30 MED ORDER — NEOSTIGMINE METHYLSULFATE 5 MG/5ML IV SOSY
PREFILLED_SYRINGE | INTRAVENOUS | Status: DC | PRN
  Administered 2021-01-30: 3 mg via INTRAVENOUS

## 2021-01-30 MED ORDER — PROPOFOL 10 MG/ML IV BOLUS
INTRAVENOUS | Status: DC | PRN
  Administered 2021-01-30: 100 mg via INTRAVENOUS

## 2021-01-30 MED ORDER — GLYCOPYRROLATE PF 0.2 MG/ML IJ SOSY
PREFILLED_SYRINGE | INTRAMUSCULAR | Status: DC | PRN
  Administered 2021-01-30: .4 mg via INTRAVENOUS

## 2021-01-30 MED ORDER — POVIDONE-IODINE 10 % EX SWAB: 2.0000 "application " | Freq: Once | CUTANEOUS | Status: DC

## 2021-01-30 MED ORDER — VANCOMYCIN HCL 1000 MG IV SOLR
INTRAVENOUS | Status: AC
  Filled 2021-01-30: qty 20

## 2021-01-30 MED ORDER — CISATRACURIUM BESYLATE 20 MG/10ML IV SOLN
INTRAVENOUS | Status: AC
  Filled 2021-01-30: qty 10

## 2021-01-30 MED ORDER — CISATRACURIUM BESYLATE (PF) 10 MG/5ML IV SOLN
INTRAVENOUS | Status: DC | PRN
  Administered 2021-01-30: 14 mg via INTRAVENOUS

## 2021-01-30 MED ORDER — ORAL CARE MOUTH RINSE: 15.0000 mL | Freq: Once | OROMUCOSAL | Status: AC

## 2021-01-30 MED ORDER — CEFAZOLIN SODIUM-DEXTROSE 2-4 GM/100ML-% IV SOLN
2.0000 g | INTRAVENOUS | Status: AC
  Administered 2021-01-30: 2 g via INTRAVENOUS
  Filled 2021-01-30: qty 100

## 2021-01-30 MED ORDER — VANCOMYCIN HCL 1000 MG IV SOLR
INTRAVENOUS | Status: DC | PRN
  Administered 2021-01-30: 1000 mg via TOPICAL

## 2021-01-30 MED ORDER — PROPOFOL 10 MG/ML IV BOLUS: INTRAVENOUS | Status: DC | PRN

## 2021-01-30 MED ORDER — CHLORHEXIDINE GLUCONATE 4 % EX LIQD: 60.0000 mL | Freq: Once | CUTANEOUS | Status: DC

## 2021-01-30 MED ORDER — PHENYLEPHRINE HCL (PRESSORS) 10 MG/ML IV SOLN
INTRAVENOUS | Status: DC | PRN
  Administered 2021-01-30 (×2): 80 ug via INTRAVENOUS

## 2021-01-30 MED ORDER — FENTANYL CITRATE (PF) 250 MCG/5ML IJ SOLN
INTRAMUSCULAR | Status: AC
  Filled 2021-01-30: qty 5

## 2021-01-30 MED ORDER — SODIUM CHLORIDE 0.9 % IV SOLN: INTRAVENOUS | Status: DC | PRN

## 2021-01-30 MED ORDER — CHLORHEXIDINE GLUCONATE 0.12 % MT SOLN
15.0000 mL | Freq: Once | OROMUCOSAL | Status: AC
  Administered 2021-01-30: 15 mL via OROMUCOSAL
  Filled 2021-01-30: qty 15

## 2021-01-30 MED ORDER — FENTANYL CITRATE (PF) 100 MCG/2ML IJ SOLN
INTRAMUSCULAR | Status: DC | PRN
  Administered 2021-01-30: 25 ug via INTRAVENOUS
  Administered 2021-01-30: 100 ug via INTRAVENOUS

## 2021-01-30 MED ORDER — ALBUMIN HUMAN 5 % IV SOLN: INTRAVENOUS | Status: DC | PRN

## 2021-01-30 MED ORDER — MUPIROCIN 2 % EX OINT
TOPICAL_OINTMENT | Freq: Two times a day (BID) | CUTANEOUS | Status: DC
  Administered 2021-02-08: 1 via TOPICAL
  Filled 2021-01-30 (×6): qty 22

## 2021-01-30 MED ORDER — PHENYLEPHRINE HCL-NACL 20-0.9 MG/250ML-% IV SOLN
INTRAVENOUS | Status: DC | PRN
  Administered 2021-01-30: 20 ug/min via INTRAVENOUS

## 2021-01-30 SURGICAL SUPPLY — 61 items
APL PRP STRL LF DISP 70% ISPRP (MISCELLANEOUS) ×2
BAG COUNTER SPONGE SURGICOUNT (BAG) ×2 IMPLANT
BAG SPNG CNTER NS LX DISP (BAG) ×1
BLADE SAW SAG 73X25 THK (BLADE) ×1
BLADE SAW SGTL 73X25 THK (BLADE) ×1 IMPLANT
BRUSH FEMORAL CANAL (MISCELLANEOUS) IMPLANT
CEMENT BONE DEPUY (Cement) ×2 IMPLANT
CEMENT RESTRICTOR DEPUY SZ 3 (Cement) ×1 IMPLANT
CHLORAPREP W/TINT 26 (MISCELLANEOUS) ×2 IMPLANT
COVER SURGICAL LIGHT HANDLE (MISCELLANEOUS) ×2 IMPLANT
DRAPE HIP W/POCKET STRL (MISCELLANEOUS) ×1 IMPLANT
DRAPE IMP U-DRAPE 54X76 (DRAPES) ×1 IMPLANT
DRAPE INCISE IOBAN 66X45 STRL (DRAPES) ×1 IMPLANT
DRAPE INCISE IOBAN 85X60 (DRAPES) ×2 IMPLANT
DRAPE ORTHO SPLIT 77X108 STRL (DRAPES)
DRAPE SURG ORHT 6 SPLT 77X108 (DRAPES) ×2 IMPLANT
DRAPE U-SHAPE 47X51 STRL (DRAPES) ×3 IMPLANT
DRSG MEPILEX BORDER 4X8 (GAUZE/BANDAGES/DRESSINGS) ×2 IMPLANT
DURAPREP 26ML APPLICATOR (WOUND CARE) ×1 IMPLANT
DURAPREP 6ML APPLICATOR 50/CS (WOUND CARE) ×1 IMPLANT
ELECT BLADE 6.5 EXT (BLADE) IMPLANT
ELECT CAUTERY BLADE 6.4 (BLADE) IMPLANT
ELECT REM PT RETURN 9FT ADLT (ELECTROSURGICAL) ×2
ELECTRODE REM PT RTRN 9FT ADLT (ELECTROSURGICAL) ×1 IMPLANT
GAUZE SPONGE 4X4 12PLY STRL (GAUZE/BANDAGES/DRESSINGS) ×1 IMPLANT
GOWN STRL REUS W/ TWL XL LVL3 (GOWN DISPOSABLE) ×1 IMPLANT
GOWN STRL REUS W/TWL XL LVL3 (GOWN DISPOSABLE) ×2
HANDPIECE INTERPULSE COAX TIP (DISPOSABLE) ×2
HEAD FEM UNIPOLAR 48 OD (Hips) ×1 IMPLANT
KIT BASIN OR (CUSTOM PROCEDURE TRAY) ×2 IMPLANT
KIT TURNOVER KIT B (KITS) ×2 IMPLANT
MANIFOLD NEPTUNE II (INSTRUMENTS) ×2 IMPLANT
NDL SUT 2 .5 CRC MAYO 1.732X (NEEDLE) IMPLANT
NEEDLE MAYO TAPER (NEEDLE) ×2
NS IRRIG 1000ML POUR BTL (IV SOLUTION) ×2 IMPLANT
PACK TOTAL JOINT (CUSTOM PROCEDURE TRAY) ×2 IMPLANT
PACK UNIVERSAL I (CUSTOM PROCEDURE TRAY) ×1 IMPLANT
PAD ARMBOARD 7.5X6 YLW CONV (MISCELLANEOUS) ×4 IMPLANT
PENCIL SMOKE EVACUATOR (MISCELLANEOUS) ×1 IMPLANT
PRESSURIZER FEMORAL UNIV (MISCELLANEOUS) ×1 IMPLANT
SET HNDPC FAN SPRY TIP SCT (DISPOSABLE) IMPLANT
SPACER DEPUY (Hips) ×1 IMPLANT
SPONGE T-LAP 18X18 ~~LOC~~+RFID (SPONGE) ×2 IMPLANT
STAPLER VISISTAT 35W (STAPLE) ×2 IMPLANT
STEM CEMENTED SUMMIT SZ2 (Stem) ×1 IMPLANT
STEM DIST FEM CENTRALIZR 11 (Hips) ×1 IMPLANT
SUT ETHIBOND NAB CT1 #1 30IN (SUTURE) ×1 IMPLANT
SUT FIBERTAPE CERCLAGE 2 48 (SUTURE) ×1 IMPLANT
SUT VIC AB 0 CT1 27 (SUTURE) ×12
SUT VIC AB 0 CT1 27XBRD ANBCTR (SUTURE) IMPLANT
SUT VIC AB 1 CT1 27 (SUTURE)
SUT VIC AB 1 CT1 27XBRD ANBCTR (SUTURE) ×1 IMPLANT
SUT VIC AB 2-0 CTB1 (SUTURE) ×1 IMPLANT
SUT VIC AB 2-0 SH 27 (SUTURE) ×4
SUT VIC AB 2-0 SH 27XBRD (SUTURE) IMPLANT
TOWEL GREEN STERILE (TOWEL DISPOSABLE) ×2 IMPLANT
TOWEL GREEN STERILE FF (TOWEL DISPOSABLE) ×2 IMPLANT
TOWER CARTRIDGE SMART MIX (DISPOSABLE) ×1 IMPLANT
TRAY FOLEY MTR SLVR 16FR STAT (SET/KITS/TRAYS/PACK) IMPLANT
WATER STERILE IRR 1000ML POUR (IV SOLUTION) ×4 IMPLANT
YANKAUER SUCT BULB TIP NO VENT (SUCTIONS) ×1 IMPLANT

## 2021-01-30 NOTE — Brief Op Note (Signed)
   Brief Op Note  Date of Surgery: 01/30/2021  Preoperative Diagnosis: Closed left hip fracture  Postoperative Diagnosis: same  Procedure: Procedure(s): ARTHROPLASTY BIPOLAR HIP (HEMIARTHROPLASTY)  Implants: Implant Name Type Inv. Item Serial No. Manufacturer Lot No. LRB No. Used Action  CEMENT BONE DEPUY - [id number] Cement CEMENT BONE DEPUY  DEPUY ORTHOPAEDICS [id number] Left 2 Implanted  IMPLANT CENTRALIZER DEPUY - [id number] Hips IMPLANT CENTRALIZER DEPUY  DEPUY ORTHOPAEDICS [id number] Left 1 Implanted  STEM CEMENTED SUMMIT SZ2 - [id number] Stem STEM CEMENTED SUMMIT SZ2  DEPUY ORTHOPAEDICS [id number] Left 1 Implanted  SPACER DEPUY - [id number] Hips SPACER DEPUY  DEPUY ORTHOPAEDICS [id number] Left 1 Implanted  HIP BALL DEPUY - [id number] Hips HIP BALL DEPUY  DEPUY ORTHOPAEDICS [id number] Left 1 Implanted  CEMENT RESTRICTOR DEPUY SZ 3 - [id number] Cement CEMENT RESTRICTOR DEPUY SZ 3  DEPUY ORTHOPAEDICS [id number] Left 1 Implanted    Surgeons: Surgeon(s): Vanetta Mulders, MD  Anesthesia: General    Estimated Blood Loss: See anesthesia record  Complications: None  Condition to PACU: Stable  Yevonne Pax, MD 01/30/2021 7:00 PM

## 2021-01-30 NOTE — Op Note (Signed)
Date of Surgery: 01/30/2021  INDICATIONS: Mr. Pedrosa is a 87 y.o.-year-old male with left displaced femoral neck fracture.  The risk and benefits of the procedure with discussed in detail and documented in the pre-operative evaluation.  PREOPERATIVE DIAGNOSIS: left displaced femoral neck fracture  POSTOPERATIVE DIAGNOSIS: Same.  PROCEDURE: left hip hemiarthroplasty  SURGEON: Yevonne Pax MD  ASSISTANT: Glory Rosebush  ANESTHESIA:  general  IV FLUIDS AND URINE: See anesthesia record.  ANTIBIOTICS: Ancef 2g  ESTIMATED BLOOD LOSS: 50 mL.  IMPLANTS:  Implant Name Type Inv. Item Serial No. Manufacturer Lot No. LRB No. Used Action  CEMENT BONE DEPUY - [id number] Cement CEMENT BONE DEPUY  DEPUY ORTHOPAEDICS [id number] Left 2 Implanted  IMPLANT CENTRALIZER DEPUY - [id number] Hips IMPLANT CENTRALIZER DEPUY  DEPUY ORTHOPAEDICS [id number] Left 1 Implanted  STEM CEMENTED SUMMIT SZ2 - [id number] Stem STEM CEMENTED SUMMIT SZ2  DEPUY ORTHOPAEDICS [id number] Left 1 Implanted  SPACER DEPUY - [id number] Hips SPACER DEPUY  DEPUY ORTHOPAEDICS [id number] Left 1 Implanted  HIP BALL DEPUY - [id number] Hips HIP BALL DEPUY  DEPUY ORTHOPAEDICS [id number] Left 1 Implanted  CEMENT RESTRICTOR DEPUY SZ 3 - [id number] Cement CEMENT RESTRICTOR DEPUY SZ 3  DEPUY ORTHOPAEDICS [id number] Left 1 Implanted    DRAINS: None  CULTURES: None  COMPLICATIONS: none  DESCRIPTION OF PROCEDURE:  The patient was identified in the preoperative holding area.  The correct operative side was identified and a timeout was performed according to universal protocol with nursing.  2g of Ancef was given prior to coming back to the operating room.  He was subsequently brought back to the operating room.  Anesthesia was induced after an epidural was performed.   The patient was placed in the lateral position using a beanbag positioner.  The correct operative side was up.  Patient was prepped and draped in the usual sterile fashion.  Of note the patient  does have an allergy to both chlorhexidine and Betadine, and as such chlorhexidine was utilized but thoroughly washed off at the end of the surgery.  An incision was made centered over the vastus ridge about the lateral aspect of the hip.  Sharp dissection was performed on the level of the IT band electrocautery was used to perform hemostasis.  A nick was then made in the IT band and Mayo scissors were utilized to split the IT band both proximal and distal.  We then dissected off the bursal tissue using Mayo scissors and a body.  We identified a split in the gluteus medius between the one third and posterior two thirds of the tendon.  A sponge was placed in the splint for temporary visualization.  We then marked out our cuff of tissue continuous with a split distally.  Electrocautery was utilized to elevate this off of the bone and a cuff in conjunction with the medius and the minimus.  External rotation was utilized to help dissect the tissue off of the bone.  We tagged this cuff of tissue with an 0 Vicryl.  We then continue this split through the hip capsule.  We use this to expose the femoral neck.  A provisional cut was then made with a saw on the femur.  This gave [place] better visualization to see the femoral had a power corkscrew was utilized to remove this.  We then irrigated the acetabulum to ensure there were no more bony pieces in the acetabulum.  We were satisfied with this.   The leg was then brought forward with external rotation into  the sterile bag to work on the femur.  His retractors as well as guidance were used for exposure.  We open the canal with the canal finder and subsequently broached up to a size 6 broach with excellent rotational stability.  A trial reduction was performed.  At this time we noticed a slight crack in the anterior cortex but did not extend to the calcar.  This was self-contained.  As such the decision was made to switch to a cemented stem. A fiberware cerclage was placed  around the femur with care to stay directly on the femur to approximate the bone.  We then transitioned to a size 2 Summit cemented stem.  The correct broach was then used for the Summit stem.  A canal restrictor was placed 2 cm distal to the implant.  We subsequently irrigated and thoroughly dried the canal.  Cement was then placed and pressurized in the femoral canal.  The final implant was put in place and held until the cement was cured.  This provided excellent rotational stability.  We used a size 48 head according to our trialing which had a good reduction.   Again reduction was performed with a good palpable clunk.  A flatplate x-ray was performed to show concentric reduction.  The wound was finally irrigated and closed in layers using 0 Vicryl for the medius tissue, 0 Vicryl for the IT band, 2-0 Vicryl for the subcutaneous tissue, and staples for the skin.  The prep was thoroughly washed off of them.  A sterile Aquacel dressing was placed.  He was awakened with no issue and taken to the postoperative unit.  All counts were correct at the end of the case        POSTOPERATIVE PLAN: he will be WBAT on the left leg. He will see physical therapy postop. I will see him at two weeks postop for staple remover.  Yevonne Pax, MD 6:55 PM

## 2021-01-30 NOTE — Assessment & Plan Note (Addendum)
Currently Foley catheter.  Will discontinue likely tomorrow.

## 2021-01-30 NOTE — Assessment & Plan Note (Signed)
New onset.  No prior history of LBBB.  Chart reviewed.  Does not have any complaints of angina, chest pain, shortness of breath or similar symptoms at rest or on exertion prior to admission.  Suspect this is incidental finding.  Echocardiogram currently pending.  No further work-up until echocardiogram is back.

## 2021-01-30 NOTE — H&P (View-Only) (Signed)
   Subjective: NPO for OR. Dialysis overnight.  Objective:   VITALS:   Vitals:   01/30/21 0430 01/30/21 0500 01/30/21 0523 01/30/21 0821  BP: (!) 110/49 112/70 (!) 142/58 (!) 129/55  Pulse:    78  Resp:    16  Temp:   97.8 F (36.6 C) 97.6 F (36.4 C)  TempSrc:   Oral Oral  SpO2:   96% 99%  Weight:      Height:        MUSCULOSKELETAL:  Left hip is shortened externally rotated.  Pain with any type of logroll.  He is able to dorsiflex and plantarflex the left foot.  Sensation is intact in all distributions of the left foot.  2+ dorsalis pedis pulse   Lab Results  Component Value Date   WBC 12.6 (H) 01/30/2021   HGB 11.1 (L) 01/30/2021   HCT 33.6 (L) 01/30/2021   MCV 98.8 01/30/2021   PLT 272 01/30/2021     Assessment/Plan:  Day of Surgery with left hip femoral neck fracture, displaced  - Plan for left hip hemiarthroplasty   Jeffrey Payne 01/30/2021, 1:51 PM

## 2021-01-30 NOTE — Assessment & Plan Note (Signed)
Does not use oxygen at baseline.  Was 88% on room air on admission.  Currently on 2 L of oxygen.  Anticipating improvement with regular HD.

## 2021-01-30 NOTE — Assessment & Plan Note (Addendum)
Mechanical fall at home. Orthopedic consulted. Intermediate surgical risk. Underwent  left hip hemiarthroplasty. Tolerated surgery very well.  Postop weightbearing as tolerated.  Pain management per orthopedic.  Heparin for DVT prophylaxis. PT recommends home with home health

## 2021-01-30 NOTE — Assessment & Plan Note (Signed)
Wound care consulted.  Mupirocin ointment and foam dressing.

## 2021-01-30 NOTE — Assessment & Plan Note (Signed)
H&H stable.  May require transfusion postoperatively.

## 2021-01-30 NOTE — Assessment & Plan Note (Signed)
HD TTS.  Appreciate nephrology input.  Volume management Per  Nephrology.

## 2021-01-30 NOTE — Interval H&P Note (Signed)
History and Physical Interval Note:  01/30/2021 1:53 PM  Jeffrey Payne  has presented today for surgery, with the diagnosis of closed left hip fracture.  The various methods of treatment have been discussed with the patient and family. After consideration of risks, benefits and other options for treatment, the patient has consented to  Procedure(s): ARTHROPLASTY BIPOLAR HIP (HEMIARTHROPLASTY) (Left) as a surgical intervention.  The patient's history has been reviewed, patient examined, no change in status, stable for surgery.  I have reviewed the patient's chart and labs.  Questions were answered to the patient's satisfaction.     Vanetta Mulders

## 2021-01-30 NOTE — Plan of Care (Signed)
  Problem: Education: Goal: Knowledge of General Education information will improve Description Including pain rating scale, medication(s)/side effects and non-pharmacologic comfort measures Outcome: Progressing   

## 2021-01-30 NOTE — Assessment & Plan Note (Signed)
Seen on admission H&P EKG.  Do not see any evidence of A. fib.  Monitor.  Continue beta-blocker.  Echocardiogram currently pending.

## 2021-01-30 NOTE — Transfer of Care (Signed)
Immediate Anesthesia Transfer of Care Note  Patient: Jeffrey Payne  Procedure(s) Performed: ARTHROPLASTY BIPOLAR HIP (HEMIARTHROPLASTY) (Left: Hip)  Patient Location: PACU  Anesthesia Type:General  Level of Consciousness: awake, drowsy, patient cooperative and responds to stimulation  Airway & Oxygen Therapy: Patient Spontanous Breathing and Patient connected to nasal cannula oxygen  Post-op Assessment: Report given to RN, Post -op Vital signs reviewed and stable and Patient moving all extremities  Post vital signs: Reviewed and stable  Last Vitals:  Vitals Value Taken Time  BP 133/42 01/30/21 1902  Temp    Pulse 77 01/30/21 1906  Resp 21 01/30/21 1906  SpO2 98 % 01/30/21 1906  Vitals shown include unvalidated device data.  Last Pain:  Vitals:   01/30/21 1446  TempSrc:   PainSc: 0-No pain         Complications: No notable events documented.

## 2021-01-30 NOTE — Anesthesia Preprocedure Evaluation (Addendum)
Anesthesia Evaluation  Patient identified by MRN, date of birth, ID band Patient awake    Reviewed: Allergy & Precautions, NPO status , Patient's Chart, lab work & pertinent test results, reviewed documented beta blocker date and time   Airway Mallampati: I  TM Distance: >3 FB Neck ROM: Full    Dental no notable dental hx. (+) Teeth Intact, Dental Advisory Given   Pulmonary neg pulmonary ROS,    Pulmonary exam normal breath sounds clear to auscultation       Cardiovascular hypertension, Pt. on medications and Pt. on home beta blockers Normal cardiovascular exam Rhythm:Regular Rate:Normal  TTE 2021 1. Left ventricular ejection fraction, by estimation, is 50 to 55%. The  left ventricle has low normal function. The left ventricle has no regional  wall motion abnormalities. There is mild concentric left ventricular  hypertrophy. Left ventricular  diastolic parameters are consistent with Grade I diastolic dysfunction  (impaired relaxation).  2. Right ventricular systolic function is normal. The right ventricular  size is normal.  3. Left atrial size was mild to moderately dilated.  4. A small pericardial effusion is present. The pericardial effusion is  circumferential. There is no evidence of cardiac tamponade.  5. The mitral valve is degenerative. Trivial mitral valve regurgitation.  6. Eccentric aortic regurgitation noted, study may underestimate degree  of regurgitation. The aortic valve is tricuspid. Aortic valve  regurgitation is mild. Mild aortic valve sclerosis is present, with no  evidence of aortic valve stenosis.  7. Aortic dilatation noted. There is borderline dilatation of the aortic  root, measuring 37 mm.  8. The inferior vena cava is normal in size with <50% respiratory  variability, suggesting right atrial pressure of 8 mmHg.    Neuro/Psych negative neurological ROS  negative psych ROS   GI/Hepatic Neg  liver ROS, GERD  ,  Endo/Other  negative endocrine ROS  Renal/GU ESRF and DialysisRenal diseaseLab Results      Component                Value               Date                      CREATININE               3.44 (H)            01/30/2021                BUN                      35 (H)              01/30/2021                NA                       134 (L)             01/30/2021                K                        3.7                 01/30/2021                CL  95 (L)              01/30/2021                CO2                      26                  01/30/2021             negative genitourinary   Musculoskeletal  (+) Arthritis ,   Abdominal   Peds  Hematology negative hematology ROS (+)   Anesthesia Other Findings   Reproductive/Obstetrics                            Anesthesia Physical Anesthesia Plan  ASA: 3  Anesthesia Plan: Spinal   Post-op Pain Management:    Induction: Intravenous  PONV Risk Score and Plan: 1 and Dexamethasone, Ondansetron and Treatment may vary due to age or medical condition  Airway Management Planned: Natural Airway and Simple Face Mask  Additional Equipment:   Intra-op Plan:   Post-operative Plan:   Informed Consent: I have reviewed the patients History and Physical, chart, labs and discussed the procedure including the risks, benefits and alternatives for the proposed anesthesia with the patient or authorized representative who has indicated his/her understanding and acceptance.     Dental advisory given  Plan Discussed with: CRNA  Anesthesia Plan Comments:        Anesthesia Quick Evaluation

## 2021-01-30 NOTE — Assessment & Plan Note (Signed)
In the setting of fracture and stress.  Monitor.

## 2021-01-30 NOTE — Anesthesia Procedure Notes (Signed)
Procedure Name: Intubation Date/Time: 01/30/2021 4:28 PM Performed by: Yehuda Mao, CRNA Pre-anesthesia Checklist: Patient identified, Emergency Drugs available, Suction available and Patient being monitored Patient Re-evaluated:Patient Re-evaluated prior to induction Oxygen Delivery Method: Circle system utilized Preoxygenation: Pre-oxygenation with 100% oxygen Induction Type: IV induction Ventilation: Mask ventilation without difficulty Laryngoscope Size: Miller and 3 Grade View: Grade II Tube type: Oral Tube size: 7.5 mm Number of attempts: 1 Airway Equipment and Method: Stylet and Oral airway Placement Confirmation: ETT inserted through vocal cords under direct vision, positive ETCO2 and breath sounds checked- equal and bilateral Secured at: 23 cm Tube secured with: Tape Dental Injury: Teeth and Oropharynx as per pre-operative assessment

## 2021-01-30 NOTE — Progress Notes (Signed)
   Subjective: NPO for OR. Dialysis overnight.  Objective:   VITALS:   Vitals:   01/30/21 0430 01/30/21 0500 01/30/21 0523 01/30/21 0821  BP: (!) 110/49 112/70 (!) 142/58 (!) 129/55  Pulse:    78  Resp:    16  Temp:   97.8 F (36.6 C) 97.6 F (36.4 C)  TempSrc:   Oral Oral  SpO2:   96% 99%  Weight:      Height:        MUSCULOSKELETAL:  Left hip is shortened externally rotated.  Pain with any type of logroll.  He is able to dorsiflex and plantarflex the left foot.  Sensation is intact in all distributions of the left foot.  2+ dorsalis pedis pulse   Lab Results  Component Value Date   WBC 12.6 (H) 01/30/2021   HGB 11.1 (L) 01/30/2021   HCT 33.6 (L) 01/30/2021   MCV 98.8 01/30/2021   PLT 272 01/30/2021     Assessment/Plan:  Day of Surgery with left hip femoral neck fracture, displaced  - Plan for left hip hemiarthroplasty   Iyari Hagner 01/30/2021, 1:51 PM

## 2021-01-30 NOTE — Assessment & Plan Note (Addendum)
Chest x-ray shows mild volume overload.  Currently clinically stable.  Oxygenation improving.

## 2021-01-30 NOTE — Plan of Care (Signed)

## 2021-01-30 NOTE — Progress Notes (Signed)
[facility] Progress Note   Subjective:  Seen in room - denies CP/dyspnea. S/p HD last night - 1.2L net UF. Does appear that has small infiltration/hematoma on L AVF. For L hip hemiarthroplasty today.  Objective Vitals:   01/30/21 0430 01/30/21 0500 01/30/21 0523 01/30/21 0821  BP: (!) 110/49 112/70 (!) 142/58 (!) 129/55  Pulse:    78  Resp:    16  Temp:   97.8 F (36.6 C) 97.6 F (36.4 C)  TempSrc:   Oral Oral  SpO2:   96% 99%  Weight:      Height:       Physical Exam General: Frail man, NAD. Nasal O2 in place. Heart: RRR; no murmur Lungs: CTA anteriorly Abdomen: soft Extremities: No LE edema Dialysis Access:  L forearm AVF + thrill but with hematoma/infiltration  Additional Objective Labs: Basic Metabolic Panel: Recent Labs  Lab 01/29/21 0840 01/30/21 0553  NA 135 134*  K 4.1 3.7  CL 95* 95*  CO2 27 26  GLUCOSE 119* 96  BUN 31* 35*  CREATININE 3.29* 3.44*  CALCIUM 9.6 10.0  PHOS 4.2 5.0*   Liver Function Tests: Recent Labs  Lab 01/29/21 0840 01/30/21 0553  AST 25  --   ALT 15  --   ALKPHOS 113  --   BILITOT 0.9  --   PROT 5.7*  --   ALBUMIN 3.1* 3.0*   Recent Labs  Lab 01/29/21 0840  LIPASE 27   CBC: Recent Labs  Lab 01/29/21 0840 01/30/21 0553  WBC 15.6* 12.6*  NEUTROABS 12.5*  --   HGB 11.1* 11.1*  HCT 34.0* 33.6*  MCV 100.0 98.8  PLT 276 272   Studies/Results: DG Chest 1 View  Result Date: 01/29/2021 CLINICAL DATA:  87 year old male status post fall last night. EXAM: CHEST  1 VIEW COMPARISON:  Chest radiographs 04/21/2020 and earlier. FINDINGS: Portable AP supine views at 0903 hours. Chronic cardiomegaly appears stable. Calcified aortic atherosclerosis. Other mediastinal contours are within normal limits. Visualized tracheal air column is within normal limits. Increased pulmonary vascularity, slightly greater on the right. Trace fluid in the right minor fissure now, but no other pleural effusion. No pneumothorax or  confluent pulmonary opacity. No acute osseous abnormality identified. IMPRESSION: 1. Chronic cardiomegaly with increased pulmonary vascular congestion. Consider mild or developing interstitial edema. 2. No other acute cardiopulmonary abnormality. Electronically Signed   By: Genevie Ann M.D.   On: 01/29/2021 09:52   CT HEAD WO CONTRAST (5MM)  Result Date: 01/29/2021 CLINICAL DATA:  Fall today. EXAM: CT HEAD WITHOUT CONTRAST CT CERVICAL SPINE WITHOUT CONTRAST TECHNIQUE: Multidetector CT imaging of the head and cervical spine was performed following the standard protocol without intravenous contrast. Multiplanar CT image reconstructions of the cervical spine were also generated. COMPARISON:  None. FINDINGS: CT HEAD FINDINGS Brain: Mild diffuse cortical atrophy is noted. No mass effect or midline shift is noted. Ventricular size is within normal limits. There is no evidence of mass lesion, hemorrhage or acute infarction. Vascular: No hyperdense vessel or unexpected calcification. Skull: Normal. Negative for fracture or focal lesion. Sinuses/Orbits: No acute finding. Other: None. CT CERVICAL SPINE FINDINGS Alignment: Mild grade 1 anterolisthesis of C4-5 is noted secondary to posterior facet joint hypertrophy. Skull base and vertebrae: No acute fracture. No primary bone lesion or focal pathologic process. Soft tissues and spinal canal: No prevertebral fluid or swelling. No visible canal hematoma. Disc levels: Moderate degenerative disc disease is noted at C5-6 and C6-7 and C7-T1. Upper chest:  Negative. Other: None. IMPRESSION: No acute intracranial abnormality seen. Moderate multilevel degenerative disc disease. No acute abnormality seen in the cervical spine. Electronically Signed   By: Marijo Conception M.D.   On: 01/29/2021 10:38   CT Cervical Spine Wo Contrast  Result Date: 01/29/2021 CLINICAL DATA:  Fall today. EXAM: CT HEAD WITHOUT CONTRAST CT CERVICAL SPINE WITHOUT CONTRAST TECHNIQUE: Multidetector CT imaging of  the head and cervical spine was performed following the standard protocol without intravenous contrast. Multiplanar CT image reconstructions of the cervical spine were also generated. COMPARISON:  None. FINDINGS: CT HEAD FINDINGS Brain: Mild diffuse cortical atrophy is noted. No mass effect or midline shift is noted. Ventricular size is within normal limits. There is no evidence of mass lesion, hemorrhage or acute infarction. Vascular: No hyperdense vessel or unexpected calcification. Skull: Normal. Negative for fracture or focal lesion. Sinuses/Orbits: No acute finding. Other: None. CT CERVICAL SPINE FINDINGS Alignment: Mild grade 1 anterolisthesis of C4-5 is noted secondary to posterior facet joint hypertrophy. Skull base and vertebrae: No acute fracture. No primary bone lesion or focal pathologic process. Soft tissues and spinal canal: No prevertebral fluid or swelling. No visible canal hematoma. Disc levels: Moderate degenerative disc disease is noted at C5-6 and C6-7 and C7-T1. Upper chest: Negative. Other: None. IMPRESSION: No acute intracranial abnormality seen. Moderate multilevel degenerative disc disease. No acute abnormality seen in the cervical spine. Electronically Signed   By: Marijo Conception M.D.   On: 01/29/2021 10:38   DG Hip Unilat W or Wo Pelvis 2-3 Views Left  Result Date: 01/29/2021 CLINICAL DATA:  Fall. EXAM: DG HIP (WITH OR WITHOUT PELVIS) 2-3V LEFT COMPARISON:  None. FINDINGS: Acute, impacted fracture deformity involves the femoral neck. There is medial angulation of the distal fracture fragments. No dislocation. Right hip is intact. IMPRESSION: Acute, impacted fracture of the left femoral neck. Electronically Signed   By: Kerby Moors M.D.   On: 01/29/2021 09:54   Medications:   Chlorhexidine Gluconate Cloth  6 each Topical Q0600   heparin  5,000 Units Subcutaneous Q8H   loratadine  10 mg Oral Daily   metoprolol succinate  50 mg Oral QPM   mupirocin ointment   Topical BID    neomycin-bacitracin-polymyxin   Topical Daily   senna-docusate  1 tablet Oral QHS   tamsulosin  0.4 mg Oral BID    Dialysis Orders: TTS at [facility], 400/700, EDW 54.5kg, 2K/2Ca, R AVF, heparin 4000 - No ESA, VDRA (fairly recent start)  Assessment/Plan: 1. L hip fracture s/p fall: For L hemiarthroplasty today with ortho. 2. ESRD: Continue HD on TTS schedule -> next tomorrow. 3. HTN/volume: Mild hypoxia/pulm edem on admit - s/p HD overnight. Will continue to UF as tolerated. 4. Anemia: Hgb 11.1 - no ESA yet, but likely will need after surgery. 5. Secondary hyperparathyroidism: CorrCa high, Phos ok. No binders or VDRA. 6. Nutrition: Alb low, adding protein supplements 7. Hx urinary retention: Self caths at home, follow. 8. A-fib: ?new Dx, per primary. 9. Leukocytosis: Unclear, per primary 10. Scalp laceration s/p fall  Veneta Penton, PA-C 01/30/2021, 9:57 AM  [employer]

## 2021-01-30 NOTE — Assessment & Plan Note (Addendum)
Mechanical fall.  PT OT consulted.  Currently recommending home with home family prefers to go home versus SNF.

## 2021-01-30 NOTE — Progress Notes (Addendum)
Progress Note    Jeffrey Payne   KXF:[id number]  DOB: 04/15/1933  DOA: 01/29/2021     1 Date of Service: 01/30/2021     Subjective:  No chest pain no nausea no vomiting no fever no chills.  Hospital Problems * Closed fracture of neck of left femur (HCC) Mechanical fall at home. Orthopedic consulted. Intermediate surgical risk. Underwent  left hip hemiarthroplasty. Tolerated surgery very well.  Postop weightbearing as tolerated.  Pain management per orthopedic.  Heparin for DVT prophylaxis.  Scalp laceration Wound care consulted.  Mupirocin ointment and foam dressing.  Fall at home, initial encounter Mechanical fall.  PT OT consulted.  ESRD (end stage renal disease) ([place]) HD TTS.  Appreciate nephrology input.  Volume management Per  Nephrology.  Acute on chronic diastolic CHF (congestive heart failure) (HCC) Chest x-ray shows mild volume overload.  Currently clinically stable.  Okay to go for the surgery.  Hypoxia Does not use oxygen at baseline.  Was 88% on room air on admission.  Currently on 2 L of oxygen.  Anticipating improvement with regular HD.  LBBB (left bundle branch block) New onset.  No prior history of LBBB.  Chart reviewed.  Does not have any complaints of angina, chest pain, shortness of breath or similar symptoms at rest or on exertion prior to admission.  Suspect this is incidental finding.  Echocardiogram currently pending.  No further work-up until echocardiogram is back.  Multifocal atrial tachycardia (HCC) Seen on admission H&P EKG.  Do not see any evidence of A. fib.  Monitor.  Continue beta-blocker.  Echocardiogram currently pending.  Leukocytosis In the setting of fracture and stress.  Monitor.  Anemia of chronic kidney failure H&H stable.  May require transfusion postoperatively.  BPH with urinary obstruction Perioperatively may require Foley catheter.  Monitor.    Objective Vital signs were reviewed and unremarkable.  Vitals:   01/30/21  1905 01/30/21 1920 01/30/21 1935 01/30/21 2000  BP: (!) 133/42 (!) 130/44 (!) 143/47 (!) 143/59  Pulse: 79 74 62 78  Resp: (!) 22 17 15 17   Temp: (!) 97 F (36.1 C)  (!) 97.4 F (36.3 C) 97.9 F (36.6 C)  TempSrc:    Oral  SpO2: 94% 90% 100% 100%  Weight:      Height:       65.8 kg  Exam Physical Exam HENT:     Head:     Comments: Scalp laceration without any acute bleeding.    Mouth/Throat:     Mouth: Mucous membranes are moist.     Pharynx: Oropharynx is clear.  Eyes:     Extraocular Movements: Extraocular movements intact.     Conjunctiva/sclera: Conjunctivae normal.     Pupils: Pupils are equal, round, and reactive to light.  Cardiovascular:     Rate and Rhythm: Normal rate and regular rhythm.     Pulses: Normal pulses.  Pulmonary:     Effort: Pulmonary effort is normal.     Breath sounds: Normal breath sounds.  Abdominal:     General: Bowel sounds are normal.     Palpations: Abdomen is soft.  Musculoskeletal:        General: Swelling present.  Neurological:     General: No focal deficit present.     Mental Status: He is alert and oriented to person, place, and time. Mental status is at baseline.  Psychiatric:        Mood and Affect: Mood normal.     Labs / Other Information My  review of labs, imaging, notes and other tests shows no new significant findings.     Time spent: 35 mins [facility] 01/30/2021, 8:23 PM

## 2021-01-31 ENCOUNTER — Inpatient Hospital Stay (HOSPITAL_COMMUNITY): Payer: Medicare Other

## 2021-01-31 DIAGNOSIS — S72002A Fracture of unspecified part of neck of left femur, initial encounter for closed fracture: Secondary | ICD-10-CM | POA: Diagnosis not present

## 2021-01-31 DIAGNOSIS — H938X1 Other specified disorders of right ear: Secondary | ICD-10-CM | POA: Diagnosis not present

## 2021-01-31 LAB — RENAL FUNCTION PANEL
Albumin: 2.9 g/dL — ABNORMAL LOW (ref 3.5–5.0)
Anion gap: 13 (ref 5–15)
BUN: 26 mg/dL — ABNORMAL HIGH (ref 8–23)
CO2: 24 mmol/L (ref 22–32)
Calcium: 9.4 mg/dL (ref 8.9–10.3)
Chloride: 98 mmol/L (ref 98–111)
Creatinine, Ser: 2.92 mg/dL — ABNORMAL HIGH (ref 0.61–1.24)
GFR, Estimated: 20 mL/min — ABNORMAL LOW (ref >60)
Glucose, Bld: 117 mg/dL — ABNORMAL HIGH (ref 70–99)
Phosphorus: 5.8 mg/dL — ABNORMAL HIGH (ref 2.5–4.6)
Potassium: 3.7 mmol/L (ref 3.5–5.1)
Sodium: 135 mmol/L (ref 135–145)

## 2021-01-31 LAB — CBC
HCT: 30.1 % — ABNORMAL LOW (ref 39.0–52.0)
Hemoglobin: 9.7 g/dL — ABNORMAL LOW (ref 13.0–17.0)
MCH: 32.6 pg (ref 26.0–34.0)
MCHC: 32.2 g/dL (ref 30.0–36.0)
MCV: 101 fL — ABNORMAL HIGH (ref 80.0–100.0)
Platelets: 211 10*3/uL (ref 150–400)
RBC: 2.98 MIL/uL — ABNORMAL LOW (ref 4.22–5.81)
RDW: 13.9 % (ref 11.5–15.5)
WBC: 12.2 10*3/uL — ABNORMAL HIGH (ref 4.0–10.5)
nRBC: 0 % (ref 0.0–0.2)

## 2021-01-31 LAB — HEPATITIS B SURFACE ANTIGEN: Hepatitis B Surface Ag: NONREACTIVE

## 2021-01-31 LAB — HEPATITIS B SURFACE ANTIBODY, QUANTITATIVE: Hep B S AB Quant (Post): <3.1 m[IU]/mL — ABNORMAL LOW (ref >9.9)

## 2021-01-31 LAB — GLUCOSE, CAPILLARY: Glucose-Capillary: 112 mg/dL — ABNORMAL HIGH (ref 70–99)

## 2021-01-31 MED ORDER — SIMETHICONE 80 MG PO CHEW
80.0000 mg | CHEWABLE_TABLET | Freq: Four times a day (QID) | ORAL | Status: DC
  Administered 2021-01-31 – 2021-02-02 (×7): 80 mg via ORAL
  Filled 2021-01-31 (×7): qty 1

## 2021-01-31 MED ORDER — DARBEPOETIN ALFA 25 MCG/0.42ML IJ SOSY
25.0000 ug | PREFILLED_SYRINGE | Freq: Once | INTRAMUSCULAR | Status: AC
  Administered 2021-01-31: 25 ug via INTRAVENOUS
  Filled 2021-01-31: qty 0.42

## 2021-01-31 MED ORDER — OXYMETAZOLINE HCL 0.05 % NA SOLN
1.0000 | Freq: Two times a day (BID) | NASAL | Status: DC
  Administered 2021-01-31: 1 via NASAL
  Filled 2021-01-31: qty 30

## 2021-01-31 NOTE — TOC Initial Note (Signed)
Transition of Care [facility]) - Initial/Assessment Note    Patient Details  Name: Jeffrey Payne MRN: [id number] Date of Birth: 10-04-1932  [facility] CM/SW Contact:    Verdell Carmine, RN Phone Number: 01/31/2021, 5:36 PM  Clinical Narrative:                 Patient admitted with  left femoral neck fracture after fall. PT assessment reveals recommendation for Home Heath PT, Patient has rolling walker at home.  Spoke with wife via phone who states he does not want to go to SNF would prefer to go home. Accepting of Home health and would like a agency that works best with his insurance. He has a rolling walker at home, order placed for 3:1 . Wife states he has a shower chair at home, shower is upstairs though so she is bringing things down to assist him to stay on the first floor until he can make progress with steps. There is a Bathroom there.   Possible DC 10/9 or 10/10 will need to call adapt for 3:1 delivery and pursue [facility]  CM will follow for needs, recommendation and transitions.   Expected Discharge Plan: [place] Barriers to Discharge: Continued Medical Work up   Patient Goals and CMS Choice        Expected Discharge Plan and Services Expected Discharge Plan: [place]   Discharge Planning Services: CM Consult Post Acute Care Choice: Durable Medical Equipment, Home Health Living arrangements for the past 2 months: Single Family Home                 DME Arranged: 3-N-1                    Prior Living Arrangements/Services Living arrangements for the past 2 months: Single Family Home Lives with:: Spouse Patient language and need for interpreter reviewed:: Yes        Need for Family Participation in Patient Care: Yes (Comment) Care giver support system in place?: Yes (comment)   Criminal Activity/Legal Involvement Pertinent to Current Situation/Hospitalization: No - Comment as needed  Activities of Daily  Living Home Assistive Devices/Equipment: Walker (specify type) ADL Screening (condition at time of admission) Patient's cognitive ability adequate to safely complete daily activities?: Yes Is the patient deaf or have difficulty hearing?: Yes Does the patient have difficulty seeing, even when wearing glasses/contacts?: No Does the patient have difficulty concentrating, remembering, or making decisions?: No Patient able to express need for assistance with ADLs?: Yes Does the patient have difficulty dressing or bathing?: Yes Independently performs ADLs?: No Communication: Independent Dressing (OT): Needs assistance Is this a change from baseline?: Pre-admission baseline Grooming: Needs assistance Is this a change from baseline?: Pre-admission baseline Feeding: Independent Bathing: Needs assistance Is this a change from baseline?: Pre-admission baseline Toileting: Independent In/Out Bed: Independent Walks in Home: Independent Does the patient have difficulty walking or climbing stairs?: Yes Weakness of Legs: Left Weakness of Arms/Hands: None  Permission Sought/Granted                  Emotional Assessment       Orientation: : Oriented to Self, Oriented to Place, Oriented to  Time Alcohol / Substance Use: Not Applicable Psych Involvement: No (comment)  Admission diagnosis:  Femoral neck fracture ([place]) [S72.009A] Fall [W19.XXXA] Closed fracture of left hip, initial encounter ([place]) [S72.002A] Injury of head, initial encounter [S09.90XA] Fall, initial encounter [W19.XXXA] Patient Active Problem  List   Diagnosis Date Noted   Closed fracture of neck of left femur ([place]) 01/30/2021   Scalp laceration 01/30/2021   Hypoxia 01/30/2021   Multifocal atrial tachycardia ([place]) 01/30/2021   LBBB (left bundle branch block) 01/30/2021   Acute on chronic diastolic CHF (congestive heart failure) ([place]) 01/30/2021   ESRD (end stage renal disease) ([place]) 01/30/2021   Fall at home, initial  encounter 01/29/2021   BPH with urinary obstruction 01/29/2021   Anemia of chronic kidney failure 01/29/2021   Leukocytosis 01/29/2021   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   Allergic rhinitis due to pollen 09/09/2020   Gastroesophageal reflux disease 09/09/2020   Hyperlipidemia 09/09/2020   Hyperparathyroidism due to renal insufficiency ([place]) 09/09/2020   Osteoarthritis of knee 99/37/1696   Systolic murmur 78/93/8101   AKI (acute kidney injury) ([place]) 04/20/2020   Acute kidney injury superimposed on CKD ([place]) 10/06/2019   CKD (chronic kidney disease), stage IV ([place]) 10/05/2019   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[place]) 10/05/2019   Bradycardia 10/05/2019   Hypotension 10/05/2019   Fatigue 10/05/2019   Hypertension    Heart murmur    PCP:  Kristen Loader, FNP Pharmacy:   [id number] - [place],  - [address] AT [place] [address] [place] [address] Phone: [phone] Fax: [phone]  [facility] [place], [place] - [address], [address] [place] [address] Phone: [phone] Fax: [phone]     Social Determinants of Health (SDOH) Interventions    Readmission Risk Interventions No flowsheet data found.

## 2021-01-31 NOTE — Progress Notes (Addendum)
[facility] Progress Note   Subjective: Seen on HD, tolerating well. No C/Os.   Objective Vitals:   01/31/21 0530 01/31/21 0751 01/31/21 0800 01/31/21 0830  BP:  (!) 138/54 (!) 141/53 139/60  Pulse:      Resp: 19 16 14 15   Temp:  98.6 F (37 C)    TempSrc:  Oral    SpO2:  100%    Weight:  53 kg    Height:       Physical Exam General:Pleasant very elderly male in NAD Heart: S1,S2 RRR No M/R/G Lungs: CTAB Anteriorly. No WOB.  Abdomen: Soft, NABS Extremities: No LE edema. Dialysis Access: AVF cannulated at present. Hematoma upper portion on AVF but doesn't seem to be compromising flow. Cannulated distal to hematoma.   Dialysis Orders:  Additional Objective Labs: Basic Metabolic Panel: Recent Labs  Lab 01/29/21 0840 01/30/21 0553 01/31/21 0108  NA 135 134* 135  K 4.1 3.7 3.7  CL 95* 95* 98  CO2 27 26 24   GLUCOSE 119* 96 117*  BUN 31* 35* 26*  CREATININE 3.29* 3.44* 2.92*  CALCIUM 9.6 10.0 9.4  PHOS 4.2 5.0* 5.8*   Liver Function Tests: Recent Labs  Lab 01/29/21 0840 01/30/21 0553 01/31/21 0108  AST 25  --   --   ALT 15  --   --   ALKPHOS 113  --   --   BILITOT 0.9  --   --   PROT 5.7*  --   --   ALBUMIN 3.1* 3.0* 2.9*   Recent Labs  Lab 01/29/21 0840  LIPASE 27   CBC: Recent Labs  Lab 01/29/21 0840 01/30/21 0553  WBC 15.6* 12.6*  NEUTROABS 12.5*  --   HGB 11.1* 11.1*  HCT 34.0* 33.6*  MCV 100.0 98.8  PLT 276 272   Blood Culture    Component Value Date/Time   SDES  04/21/2020 0328    BLOOD RIGHT HAND Performed at [facility], [place] [address], [place], [place] [address]    SPECREQUEST  04/21/2020 0328    BOTTLES DRAWN AEROBIC ONLY Blood Culture adequate volume Performed at [place] [address], [place], [place] [address]    CULT  04/21/2020 0328    NO GROWTH 5 DAYS Performed at [facility], [place] [address], [place], [place] [address]    REPTSTATUS 04/26/2020 FINAL  04/21/2020 0328    Cardiac Enzymes: No results for input(s): CKTOTAL, CKMB, CKMBINDEX, TROPONINI in the last 168 hours. CBG: No results for input(s): GLUCAP in the last 168 hours. Iron Studies: No results for input(s): IRON, TIBC, TRANSFERRIN, FERRITIN in the last 72 hours. @lablastinr3 @ Studies/Results: DG Chest 1 View  Result Date: 01/29/2021 CLINICAL DATA:  87 year old male status post fall last night. EXAM: CHEST  1 VIEW COMPARISON:  Chest radiographs 04/21/2020 and earlier. FINDINGS: Portable AP supine views at 0903 hours. Chronic cardiomegaly appears stable. Calcified aortic atherosclerosis. Other mediastinal contours are within normal limits. Visualized tracheal air column is within normal limits. Increased pulmonary vascularity, slightly greater on the right. Trace fluid in the right minor fissure now, but no other pleural effusion. No pneumothorax or confluent pulmonary opacity. No acute osseous abnormality identified. IMPRESSION: 1. Chronic cardiomegaly with increased pulmonary vascular congestion. Consider mild or developing interstitial edema. 2. No other acute cardiopulmonary abnormality. Electronically Signed   By: Genevie Ann M.D.   On: 01/29/2021 09:52   CT HEAD WO CONTRAST (5MM)  Result Date: 01/29/2021 CLINICAL DATA:  Fall today. EXAM: CT HEAD WITHOUT CONTRAST CT CERVICAL SPINE WITHOUT CONTRAST TECHNIQUE: Multidetector CT imaging of the head and cervical spine was performed following the standard protocol without intravenous contrast. Multiplanar CT image reconstructions of the cervical spine were also generated. COMPARISON:  None. FINDINGS: CT HEAD FINDINGS Brain: Mild diffuse cortical atrophy is noted. No mass effect or midline shift is noted. Ventricular size is within normal limits. There is no evidence of mass lesion, hemorrhage or acute infarction. Vascular: No hyperdense vessel or unexpected calcification. Skull: Normal. Negative for fracture or focal lesion. Sinuses/Orbits: No  acute finding. Other: None. CT CERVICAL SPINE FINDINGS Alignment: Mild grade 1 anterolisthesis of C4-5 is noted secondary to posterior facet joint hypertrophy. Skull base and vertebrae: No acute fracture. No primary bone lesion or focal pathologic process. Soft tissues and spinal canal: No prevertebral fluid or swelling. No visible canal hematoma. Disc levels: Moderate degenerative disc disease is noted at C5-6 and C6-7 and C7-T1. Upper chest: Negative. Other: None. IMPRESSION: No acute intracranial abnormality seen. Moderate multilevel degenerative disc disease. No acute abnormality seen in the cervical spine. Electronically Signed   By: Marijo Conception M.D.   On: 01/29/2021 10:38   CT Cervical Spine Wo Contrast  Result Date: 01/29/2021 CLINICAL DATA:  Fall today. EXAM: CT HEAD WITHOUT CONTRAST CT CERVICAL SPINE WITHOUT CONTRAST TECHNIQUE: Multidetector CT imaging of the head and cervical spine was performed following the standard protocol without intravenous contrast. Multiplanar CT image reconstructions of the cervical spine were also generated. COMPARISON:  None. FINDINGS: CT HEAD FINDINGS Brain: Mild diffuse cortical atrophy is noted. No mass effect or midline shift is noted. Ventricular size is within normal limits. There is no evidence of mass lesion, hemorrhage or acute infarction. Vascular: No hyperdense vessel or unexpected calcification. Skull: Normal. Negative for fracture or focal lesion. Sinuses/Orbits: No acute finding. Other: None. CT CERVICAL SPINE FINDINGS Alignment: Mild grade 1 anterolisthesis of C4-5 is noted secondary to posterior facet joint hypertrophy. Skull base and vertebrae: No acute fracture. No primary bone lesion or focal pathologic process. Soft tissues and spinal canal: No prevertebral fluid or swelling. No visible canal hematoma. Disc levels: Moderate degenerative disc disease is noted at C5-6 and C6-7 and C7-T1. Upper chest: Negative. Other: None. IMPRESSION: No acute  intracranial abnormality seen. Moderate multilevel degenerative disc disease. No acute abnormality seen in the cervical spine. Electronically Signed   By: Marijo Conception M.D.   On: 01/29/2021 10:38   DG HIP UNILAT WITH PELVIS 1V LEFT  Result Date: 01/30/2021 CLINICAL DATA:  Left hip hemiarthroplasty EXAM: DG HIP (WITH OR WITHOUT PELVIS) 1V*L* COMPARISON:  01/29/2021 FINDINGS: Single frontal view of the left hip demonstrates interval placement of a left hip hemiarthroplasty in the expected position without signs of acute complication. Postsurgical changes in the soft tissues. IMPRESSION: 1. Unremarkable left hip hemiarthroplasty. Electronically Signed   By: Randa Ngo M.D.   On: 01/30/2021 19:07   DG Hip Unilat W or Wo Pelvis 2-3 Views Left  Result Date: 01/29/2021 CLINICAL DATA:  Fall. EXAM: DG HIP (WITH OR WITHOUT PELVIS) 2-3V LEFT COMPARISON:  None. FINDINGS: Acute, impacted fracture deformity involves the femoral neck. There is medial angulation of the distal fracture fragments. No dislocation. Right hip is intact. IMPRESSION: Acute, impacted fracture of the left femoral neck. Electronically Signed   By: Kerby Moors M.D.   On: 01/29/2021 09:54   Medications:   ceFAZolin (ANCEF) IV 2 g (01/30/21 2227)    Chlorhexidine Gluconate Cloth  6 each Topical Q0600   heparin  5,000 Units Subcutaneous Q8H   loratadine  10 mg Oral Daily   metoprolol succinate  50 mg Oral QPM   mupirocin ointment   Topical BID   senna-docusate  1 tablet Oral QHS   tamsulosin  0.4 mg Oral BID     Dialysis Orders: TTS at [facility] 4hr, 400/700, EDW 54.5kg, 2K/2Ca, R AVF,  - heparin 4000 units IV TIW - No ESA, VDRA (fairly recent start)   Assessment/Plan: 1. L hip fracture s/p fall: s/pL hemiarthroplasty 01/30/2021 by ortho. 2. ESRD: Continue HD on TTS schedule. HD today on schedule.  3. HTN/volume: Mild hypoxia/pulm edem on admit - s/p HD overnight 01/30/2021. Volume seems improved today.  Will continue to UF as  tolerated.  4. Anemia: Hgb 11.1 on admit. Down to 9.7 this AM. Give Aranesp 25 mcg IV today. Follow HGB. Transfuse if needed.  5. Secondary hyperparathyroidism: CorrCa high, Phos ok. No binders or VDRA. Use 2.0 Ca bath.  6. Nutrition: Alb low, adding protein supplements 7. Hx urinary retention: Self caths at home, follow. 8. MAT: seen on admission, not in Afib.  SR with PACS at present. per primary. 9. Leukocytosis: Unclear, per primary 10. Scalp laceration s/p fall  Rita H. Brown NP-C 01/31/2021, 8:44 AM  [employer] [phone]

## 2021-01-31 NOTE — Assessment & Plan Note (Addendum)
Resolved with Afrin spray.

## 2021-01-31 NOTE — Discharge Instructions (Signed)
     Discharge Instructions    Attending Surgeon: Vanetta Mulders, MD Office Phone Number: [phone]   Diagnosis and Procedures:    Surgeries Performed: Left hip hemiarthroplasty  Discharge Plan:    Diet: Resume usual diet. Begin with light or bland foods.  Drink plenty of fluids.  Activity:  Weight bearing as tolerated  GENERAL INSTRUCTIONS: 1.  Keep your surgical site elevated above your heart for at least 5-7 days or longer to prevent swelling. This will improve your comfort and your overall recovery following surgery.     2. Please call Dr. Eddie Dibbles office at [phone] with questions Monday-Friday during business hours. If no one answers, please leave a message and someone should get back to the patient within 24 hours. For emergencies please call 911 or proceed to the emergency room.   3. Patient to notify surgical team if experiences any of the following: Bowel/Bladder dysfunction, uncontrolled pain, nerve/muscle weakness, incision with increased drainage or redness, nausea/vomiting and Fever greater than 101.0 F.  Be alert for signs of infection including redness, streaking, odor, fever or chills. Be alert for excessive pain or bleeding and notify your surgeon immediately.  WOUND INSTRUCTIONS:   Leave your dressing/cast/splint in place until your post operative visit.  Keep it clean and dry.  Always keep the incision clean and dry until the staples/sutures are removed. If there is no drainage from the incision you should keep it open to air. If there is drainage from the incision you must keep it covered at all times until the drainage stops  Do not soak in a bath tub, hot tub, pool, lake or other body of water until 21 days after your surgery and your incision is completely dry and healed.  If you have removable sutures (or staples) they must be removed 10-14 days (unless otherwise instructed) from the day of your surgery.     1)  Elevate the extremity as much as  possible.  2)  Keep the dressing clean and dry.  3)  Please call us if the dressing becomes wet or dirty.  4)  If you are experiencing worsening pain or worsening swelling, please call.     MEDICATIONS: Resume all previous home medications at the previous prescribed dose and frequency unless otherwise noted Start taking the  pain medications on an as-needed basis as prescribed  Please taper down pain medication over the next week following surgery.  Ideally you should not require a refill of any narcotic pain medication.  Take pain medication with food to minimize nausea. In addition to the prescribed pain medication, you may take over-the-counter pain relievers such as Tylenol.  Do NOT take additional tylenol if your pain medication already has tylenol in it.  Heparin 5000units 3 times daily for four weeks.      FOLLOWUP INSTRUCTIONS: 1. Follow up at the Physical Therapy Clinic 3-4 days following surgery. This appointment should be scheduled unless other arrangements have been made.The Physical Therapy scheduling number is [phone] if an appointment has not already been arranged.  2. Contact Dr. Eddie Dibbles office during office hours at [phone] or the practice after hours line at [phone] for non-emergencies. For medical emergencies call 911.

## 2021-01-31 NOTE — Progress Notes (Signed)
   Subjective:  Patient reports pain as mild, worse when attempting to move the leg. In Dialysis this AM.   Objective:   VITALS:   Vitals:   01/31/21 0900 01/31/21 0930 01/31/21 1000 01/31/21 1030  BP: (!) 151/50 (!) 128/50 (!) 122/52 129/61  Pulse:    69  Resp: 13 15 10 16   Temp:    97.7 F (36.5 C)  TempSrc:    Oral  SpO2:    99%  Weight:    50 kg  Height:        Neurologically intact Neurovascular intact Sensation intact distally Intact pulses distally Dorsiflexion/Plantar flexion intact   Lab Results  Component Value Date   WBC 12.2 (H) 01/31/2021   HGB 9.7 (L) 01/31/2021   HCT 30.1 (L) 01/31/2021   MCV 101.0 (H) 01/31/2021   PLT 211 01/31/2021     Assessment/Plan:  1 Day Post-Op   - Expected postop acute blood loss anemia - will monitor for symptoms - Patient to work with PT/OT to optimize mobilization safely - DVT ppx - SCDs, ambulation, Heparin 5000u TID - Postoperative Abx: Ancef x 2 additional doses given - WBAT operative extremity - Pain control - multimodal pain management, ATC acetaminophen in conjunction with as needed narcotic (oxycodone), although this should be minimized with other modalities  - Discharge planning pending CM, appreciate coordination   Jeffrey Payne 01/31/2021, 11:01 AM

## 2021-01-31 NOTE — Progress Notes (Signed)
Progress Note    Jeffrey Payne   RDE:[id number]  DOB: 11-Apr-1933  DOA: 01/29/2021     2 Date of Service: 01/31/2021   Subjective:  Reports uncontrolled pain as a number to receive any pain medication.  No nausea no vomiting.  No fever no chills.  Hospital Problems * Closed fracture of neck of left femur (HCC) Mechanical fall at home. Orthopedic consulted. Intermediate surgical risk. Underwent  left hip hemiarthroplasty. Tolerated surgery very well.  Postop weightbearing as tolerated.  Pain management per orthopedic.  Heparin for DVT prophylaxis. PT recommends home with home health  Scalp laceration Wound care consulted.  Mupirocin ointment and foam dressing.  Fall at home, initial encounter Mechanical fall.  PT OT consulted.  Currently recommending home with home family prefers to go home versus SNF.  ESRD (end stage renal disease) ([facility]) HD TTS.  Appreciate nephrology input.  Volume management Per  Nephrology.  Acute on chronic diastolic CHF (congestive heart failure) (HCC) Chest x-ray shows mild volume overload.  Currently clinically stable.  Oxygenation improving.  Hypoxia Does not use oxygen at baseline.  Was 88% on room air on admission.  Currently on 2 L of oxygen.  Anticipating improvement with regular HD.  LBBB (left bundle branch block) New onset.  No prior history of LBBB.  Chart reviewed.  Does not have any complaints of angina, chest pain, shortness of breath or similar symptoms at rest or on exertion prior to admission.  Suspect this is incidental finding.  Echocardiogram currently pending.  No further work-up until echocardiogram is back.  Multifocal atrial tachycardia (HCC) Seen on admission H&P EKG.  Do not see any evidence of A. fib.  Monitor.  Continue beta-blocker.  Echocardiogram currently pending.  Leukocytosis In the setting of fracture and stress.  Monitor.  Anemia of chronic kidney failure H&H stable.  May require transfusion postoperatively.  BPH  with urinary obstruction Currently Foley catheter.  Will discontinue likely tomorrow.  Ear stuffiness, right Will attempt Afrin spray.  If no improvement will perform further work-up and treatment.     Objective Vital signs were reviewed and unremarkable.  Vitals:   01/31/21 1000 01/31/21 1030 01/31/21 1156 01/31/21 1501  BP: (!) 122/52 129/61 (!) 131/44 (!) 110/51  Pulse:  69 84 84  Resp: 10 16 15 16   Temp:  97.7 F (36.5 C) 98.6 F (37 C) 98.3 F (36.8 C)  TempSrc:  Oral Oral Oral  SpO2:  99% 99% 99%  Weight:  50 kg    Height:       50 kg  Exam Physical Exam Constitutional:      Appearance: Normal appearance.  HENT:     Head: Normocephalic.     Mouth/Throat:     Mouth: Mucous membranes are moist.  Eyes:     Extraocular Movements: Extraocular movements intact.  Cardiovascular:     Rate and Rhythm: Normal rate and regular rhythm.  Pulmonary:     Effort: Pulmonary effort is normal.     Breath sounds: Normal breath sounds.  Abdominal:     General: Bowel sounds are normal.     Palpations: Abdomen is soft.  Neurological:     General: No focal deficit present.     Mental Status: He is alert and oriented to person, place, and time.     Labs / Other Information My review of labs, imaging, notes and other tests shows no new significant findings.     Time spent: 35 mins [facility] 01/31/2021, 7:39  PM   

## 2021-01-31 NOTE — Evaluation (Signed)
Physical Therapy Evaluation Patient Details Name: Jeffrey Payne MRN: [id number] DOB: [birth year] Today's Date: 01/31/2021  History of Present Illness  Pt. is 86 yr old M admitted on 10/6 following fall resulting in L hip pain. Imaging (+) for impacted L fem neck fx. CT head/C-spine (-) for acute injury. Pt. underwent L partial hemiarthroplasy (ant/lat approach) on 10/7. PMH: anemia, arthritis, prostate CA, CKD, HOH, heart murmur, HTN, ESRD on HD  Clinical Impression  Pt. Was previously mod I with functional mobility with use of RW, has hx of multiple falls. S/p L partial hemiarthroplasty, pt. Requiring mod A for bed mobility and min A for transfers.  Pt. Demos dec LE strength, dec ROM, transfer difficulty, gait deviations and dec balance and would benefit from skilled PT in acute care to address deficits indicated in initial eval.  AMPAC score is currently indicating SNF as safest D/C plan, however, pt. And spouse would prefer to go home.  Spouse/pt. Understand that depending on pt. Progress, we may need to consider SNF.   Recommendations for follow up therapy are one component of a multi-disciplinary discharge planning process, led by the attending physician.  Recommendations may be updated based on patient status, additional functional criteria and insurance authorization.  Follow Up Recommendations Home health PT (Spouse/pt. would prefer to go home with [facility] PT.  They understand that if pt. does not progress well during hospital admission, that they will need to consider SNF.)    Equipment Recommendations  Rolling walker with 5" wheels;3in1 (PT) (Pt. has RW at home.)    Recommendations for Other Services OT consult     Precautions / Restrictions Precautions Precautions: Anterior Hip Precaution Booklet Issued: Yes (comment) Restrictions Weight Bearing Restrictions: Yes LLE Weight Bearing: Weight bearing as tolerated      Mobility  Bed Mobility Overal bed mobility: Needs Assistance Bed  Mobility: Supine to Sit     Supine to sit: Mod assist     General bed mobility comments: Mod A for L LE negotiation to EOB, HH/trunk support with sitting up and use of chuck pad to scoot to EOB. Patient Response: Cooperative  Transfers Overall transfer level: Needs assistance Equipment used: Rolling walker (2 wheeled) Transfers: Sit to/from [facility] Sit to Stand: Min assist Stand pivot transfers: Min assist       General transfer comment: Pt. educated on safety with hand and leg placement during transfers, able to complete transfer with min A.  Demos fair standing balance with use of AD.  Able to take small steps over to chair during transfer.  Difficulty advancing R LE due to pain in L LE with weightbearing.  Ambulation/Gait                Stairs            Wheelchair Mobility    Modified Rankin (Stroke Patients Only)       Balance Overall balance assessment: Needs assistance         Standing balance support: Bilateral upper extremity supported;During functional activity Standing balance-Leahy Scale: Poor                               Pertinent Vitals/Pain Pain Assessment: 0-10 Pain Score: 5  Pain Location: L LE Pain Descriptors / Indicators: Aching;Discomfort;Tender Pain Intervention(s): Limited activity within patient's tolerance;Repositioned    Home Living Family/patient expects to be discharged to:: Private residence Living Arrangements: Spouse/significant other Available Help at Discharge:  Family Type of Home: House       Home Layout: Two level (Pt. was able to climb stairs to his upstairs bedroom prior to admission.  Spouse states she might get pull out couch for downstairs for him.) Home Equipment: Walker - 2 wheels      Prior Function Level of Independence: Independent with assistive device(s)         Comments: Pt. previously mod I with amb/functional mobility.  Would dress self but had assist with  bathing.  Has had frequent falls recently.     Hand Dominance        Extremity/Trunk Assessment        Lower Extremity Assessment Lower Extremity Assessment: LLE deficits/detail LLE Deficits / Details: Grossly 2+/5 LLE: Unable to fully assess due to pain       Communication      Cognition Arousal/Alertness: Awake/alert Behavior During Therapy: WFL for tasks assessed/performed Overall Cognitive Status: Within Functional Limits for tasks assessed                                 General Comments: Attempting to eat lunch in bed when PT arrives, frustrated with positioning.  Agreeable to allow PT to assist him up to chair so he can be better positioned for his meal.      General Comments General comments (skin integrity, edema, etc.): Pt and spouse educated on hip precautions and provided with handout.  Demo understanding.    Exercises     Assessment/Plan    PT Assessment Patient needs continued PT services  PT Problem List Decreased strength;Decreased mobility;Decreased range of motion;Decreased knowledge of precautions;Decreased activity tolerance;Decreased balance;Pain       PT Treatment Interventions DME instruction;Gait training;Therapeutic exercise;Balance training;Stair training;Functional mobility training;Therapeutic activities;Patient/family education    PT Goals (Current goals can be found in the Care Plan section)  Acute Rehab PT Goals Patient Stated Goal: Pt's goal is to return home PT Goal Formulation: With patient/family Time For Goal Achievement: 02/14/21 Potential to Achieve Goals: Good    Frequency Min 5X/week   Barriers to discharge Inaccessible home environment Flight of stairs up to bedroom.    Co-evaluation               AM-PAC PT "6 Clicks" Mobility  Outcome Measure Help needed turning from your back to your side while in a flat bed without using bedrails?: A Little Help needed moving from lying on your back to  sitting on the side of a flat bed without using bedrails?: A Lot Help needed moving to and from a bed to a chair (including a wheelchair)?: A Little Help needed standing up from a chair using your arms (e.g., wheelchair or bedside chair)?: A Little Help needed to walk in hospital room?: A Lot Help needed climbing 3-5 steps with a railing? : A Lot 6 Click Score: 15    End of Session Equipment Utilized During Treatment: Gait belt Activity Tolerance: Patient tolerated treatment well Patient left: in chair;with call bell/phone within reach;with bed alarm set;with family/visitor present   PT Visit Diagnosis: History of falling (Z91.81);Other abnormalities of gait and mobility (R26.89);Difficulty in walking, not elsewhere classified (R26.2);Pain Pain - Right/Left: Left Pain - part of body: Hip    Time: 0134-0215 PT Time Calculation (min) (ACUTE ONLY): 41 min   Charges:   PT Evaluation $PT Eval Low Complexity: 1 Low PT Treatments $Therapeutic Activity: 23-37 mins  Harlan Ervine A. Iviana Blasingame, PT, DPT Acute Rehabilitation Services Office: [place] 01/31/2021, 2:22 PM

## 2021-01-31 NOTE — Progress Notes (Signed)
Came bedside for echo, but patient prefers to eat dinner at this time.

## 2021-02-01 ENCOUNTER — Inpatient Hospital Stay (HOSPITAL_COMMUNITY): Payer: Medicare Other

## 2021-02-01 DIAGNOSIS — I5041 Acute combined systolic (congestive) and diastolic (congestive) heart failure: Secondary | ICD-10-CM | POA: Diagnosis present

## 2021-02-01 DIAGNOSIS — I5043 Acute on chronic combined systolic (congestive) and diastolic (congestive) heart failure: Secondary | ICD-10-CM | POA: Diagnosis present

## 2021-02-01 DIAGNOSIS — I4891 Unspecified atrial fibrillation: Secondary | ICD-10-CM

## 2021-02-01 DIAGNOSIS — I5042 Chronic combined systolic (congestive) and diastolic (congestive) heart failure: Secondary | ICD-10-CM | POA: Diagnosis present

## 2021-02-01 DIAGNOSIS — S72002A Fracture of unspecified part of neck of left femur, initial encounter for closed fracture: Secondary | ICD-10-CM | POA: Diagnosis not present

## 2021-02-01 LAB — CBC
HCT: 31 % — ABNORMAL LOW (ref 39.0–52.0)
Hemoglobin: 10.1 g/dL — ABNORMAL LOW (ref 13.0–17.0)
MCH: 32.4 pg (ref 26.0–34.0)
MCHC: 32.6 g/dL (ref 30.0–36.0)
MCV: 99.4 fL (ref 80.0–100.0)
Platelets: 186 10*3/uL (ref 150–400)
RBC: 3.12 MIL/uL — ABNORMAL LOW (ref 4.22–5.81)
RDW: 13.7 % (ref 11.5–15.5)
WBC: 12.3 10*3/uL — ABNORMAL HIGH (ref 4.0–10.5)
nRBC: 0 % (ref 0.0–0.2)

## 2021-02-01 LAB — ECHOCARDIOGRAM COMPLETE
Area-P 1/2: 2.56 cm2
Height: 67 in
S' Lateral: 4.4 cm
Weight: 1763.68 oz

## 2021-02-01 LAB — RENAL FUNCTION PANEL
Albumin: 2.6 g/dL — ABNORMAL LOW (ref 3.5–5.0)
Anion gap: 15 (ref 5–15)
BUN: 23 mg/dL (ref 8–23)
CO2: 24 mmol/L (ref 22–32)
Calcium: 9.2 mg/dL (ref 8.9–10.3)
Chloride: 93 mmol/L — ABNORMAL LOW (ref 98–111)
Creatinine, Ser: 3.25 mg/dL — ABNORMAL HIGH (ref 0.61–1.24)
GFR, Estimated: 18 mL/min — ABNORMAL LOW (ref >60)
Glucose, Bld: 129 mg/dL — ABNORMAL HIGH (ref 70–99)
Phosphorus: 5.6 mg/dL — ABNORMAL HIGH (ref 2.5–4.6)
Potassium: 3.4 mmol/L — ABNORMAL LOW (ref 3.5–5.1)
Sodium: 132 mmol/L — ABNORMAL LOW (ref 135–145)

## 2021-02-01 MED ORDER — ASPIRIN EC 81 MG PO TBEC
81.0000 mg | DELAYED_RELEASE_TABLET | Freq: Every day | ORAL | Status: DC
  Administered 2021-02-01 – 2021-02-10 (×8): 81 mg via ORAL
  Filled 2021-02-01 (×8): qty 1

## 2021-02-01 MED ORDER — POLYETHYLENE GLYCOL 3350 17 G PO PACK
17.0000 g | PACK | Freq: Every day | ORAL | Status: DC
  Administered 2021-02-01 – 2021-02-02 (×2): 17 g via ORAL
  Filled 2021-02-01 (×2): qty 1

## 2021-02-01 MED ORDER — NEPRO/CARBSTEADY PO LIQD
237.0000 mL | Freq: Three times a day (TID) | ORAL | Status: DC
  Administered 2021-02-02: 237 mL via ORAL
  Filled 2021-02-01: qty 237

## 2021-02-01 MED ORDER — SENNOSIDES-DOCUSATE SODIUM 8.6-50 MG PO TABS
1.0000 | ORAL_TABLET | Freq: Two times a day (BID) | ORAL | Status: DC
  Administered 2021-02-01 – 2021-02-02 (×2): 1 via ORAL
  Filled 2021-02-01 (×2): qty 1

## 2021-02-01 NOTE — Progress Notes (Signed)
Physical Therapy Treatment Patient Details Name: Jeffrey Payne MRN: [id number] DOB: [birth year] Today's Date: 02/01/2021   History of Present Illness Pt. is 87 yr old M admitted on 10/6 following fall resulting in L hip pain. Imaging (+) for impacted L fem neck fx. CT head/C-spine (-) for acute injury. Pt. underwent L partial hemiarthroplasy (ant/lat approach) on 10/7. PMH: anemia, arthritis, prostate CA, CKD, HOH, heart murmur, HTN, ESRD on HD    PT Comments    Continuing work on functional mobility and activity tolerance;  Noting very good participation with PT/OT today; Able to take steps with RW, 6 ft in room today;   Pt and wife seem pleased with progress (even if it's slow); Upon discussion re: dc planning with OT and pt and wife, they expressed understanding that post-acute rehab will be necessary to help maximize independence and safety with mobility and ADLs; He worked hard, and is motivated to reach better independence; he has a pmh including ESRD, and is an octogenarian (perhaps this meets the medical complexity qualification); REcommend CIR   Recommendations for follow up therapy are one component of a multi-disciplinary discharge planning process, led by the attending physician.  Recommendations may be updated based on patient status, additional functional criteria and insurance authorization.  Follow Up Recommendations  CIR     Equipment Recommendations  Rolling walker with 5" wheels;3in1 (PT) (If for home, consider hospital bed on first floor)    Recommendations for Other Services       Precautions / Restrictions Precautions Precautions: Anterior Hip Precaution Booklet Issued: Yes (comment) Precaution Comments: Posted in room; reinforced anterior hip precautions and considerations with bed mobility and amb Restrictions LLE Weight Bearing: Weight bearing as tolerated     Mobility  Bed Mobility Overal bed mobility: Needs Assistance Bed Mobility: Supine to Sit      Supine to sit: Mod assist     General bed mobility comments: Mod A for L LE negotiation to EOB,trunk support with sitting up and use of chuck pad to scoot to EOB.    Transfers Overall transfer level: Needs assistance Equipment used: Rolling walker (2 wheeled) Transfers: Sit to/from Stand Sit to Stand: Min assist         General transfer comment: VCs for safe hand placement  Ambulation/Gait Ambulation/Gait assistance: Mod assist;+2 safety/equipment Gait Distance (Feet): 6 Feet Assistive device: Rolling walker (2 wheeled) Gait Pattern/deviations: Step-to pattern;Antalgic     General Gait Details: Cues for sequence; light mod assist to help advance R and L feet; cues to support self on RW to unweigh painful LLE in stance, allow for weight shift L to allow for R stepping   Stairs             Wheelchair Mobility    Modified Rankin (Stroke Patients Only)       Balance Overall balance assessment: Needs assistance Sitting-balance support: No upper extremity supported;Feet supported Sitting balance-Leahy Scale: Fair       Standing balance-Leahy Scale: Poor                              Cognition Arousal/Alertness: Awake/alert Behavior During Therapy: WFL for tasks assessed/performed Overall Cognitive Status: Within Functional Limits for tasks assessed                                        Exercises  Total Joint Exercises Quad Sets: Left;5 reps Heel Slides: AAROM;Left;5 reps    General Comments General comments (skin integrity, edema, etc.): Education provided on Hip Precautions throughout session; and Precautions posted in room      Pertinent Vitals/Pain Pain Assessment: 0-10 Pain Score: 7  Pain Location: L LE Pain Descriptors / Indicators: Aching;Discomfort Pain Intervention(s): Limited activity within patient's tolerance    Home Living                      Prior Function            PT Goals (current goals  can now be found in the care plan section) Acute Rehab PT Goals Patient Stated Goal: agreeable to rehab, but really wants to go home PT Goal Formulation: With patient/family Time For Goal Achievement: 02/14/21 Potential to Achieve Goals: Good Progress towards PT goals: Progressing toward goals    Frequency    Min 5X/week      PT Plan Discharge plan needs to be updated    Co-evaluation PT/OT/SLP Co-Evaluation/Treatment: Yes Reason for Co-Treatment: For patient/therapist safety;To address functional/ADL transfers (to help safely progress pt)          AM-PAC PT "6 Clicks" Mobility   Outcome Measure  Help needed turning from your back to your side while in a flat bed without using bedrails?: A Lot Help needed moving from lying on your back to sitting on the side of a flat bed without using bedrails?: A Lot Help needed moving to and from a bed to a chair (including a wheelchair)?: A Little Help needed standing up from a chair using your arms (e.g., wheelchair or bedside chair)?: A Little Help needed to walk in hospital room?: A Lot Help needed climbing 3-5 steps with a railing? : A Lot 6 Click Score: 14    End of Session Equipment Utilized During Treatment: Gait belt Activity Tolerance: Patient tolerated treatment well Patient left: in chair;with call bell/phone within reach;with bed alarm set;with family/visitor present Nurse Communication: Mobility status PT Visit Diagnosis: History of falling (Z91.81);Other abnormalities of gait and mobility (R26.89);Difficulty in walking, not elsewhere classified (R26.2);Pain Pain - Right/Left: Left Pain - part of body: Hip     Time: 0102-7253 PT Time Calculation (min) (ACUTE ONLY): 33 min  Charges:  $Gait Training: 8-22 mins                     Roney Marion, PT  Acute Rehabilitation Services Pager [phone] Office [phone]    Colletta [place] 02/01/2021, 5:19 PM

## 2021-02-01 NOTE — Progress Notes (Signed)
  Echocardiogram 2D Echocardiogram has been performed.  Jeffrey Payne M 02/01/2021, 9:49 AM

## 2021-02-01 NOTE — Progress Notes (Signed)
  Progress Note    Jeffrey Payne   XAJ:[id number]  DOB: April 10, 1933  DOA: 01/29/2021     3 Date of Service: 02/01/2021   Subjective:  No nausea no vomiting no fever no chills.  Fatigue and tired.  Pain well controlled.  Hospital Problems * Closed fracture of neck of left femur (HCC) Mechanical fall at home. Orthopedic consulted. Intermediate surgical risk. Underwent  left hip hemiarthroplasty. Tolerated surgery very well.  Postop weightbearing as tolerated.  Pain management per orthopedic.  Heparin for DVT prophylaxis. PT recommends home with home health  Scalp laceration Wound care consulted.  Mupirocin ointment and foam dressing.  Fall at home, initial encounter Mechanical fall.  PT OT consulted.  Currently recommending home with home family prefers to go home versus SNF.  ESRD (end stage renal disease) ([place]) HD TTS.  Appreciate nephrology input.  Volume management Per  Nephrology.  Acute on chronic diastolic CHF (congestive heart failure) (HCC) Chest x-ray shows mild volume overload.  Currently clinically stable.  Oxygenation improving.  Hypoxia Does not use oxygen at baseline.  Was 88% on room air on admission.  Currently on 2 L of oxygen.  Anticipating improvement with regular HD.  Acute combined systolic and diastolic congestive heart failure (HCC) Echocardiogram shows worsening EF from prior echocardiogram.  Will consult cardiology for further assessment.  Patient is on Toprol-XL and aspirin.  LBBB (left bundle branch block) New onset.  No prior history of LBBB.  Chart reviewed.  Does not have any complaints of angina, chest pain, shortness of breath or similar symptoms at rest or on exertion prior to admission.  Suspect this is incidental finding.  Echocardiogram currently pending.  No further work-up until echocardiogram is back.  Multifocal atrial tachycardia (HCC) Seen on admission H&P EKG.  Do not see any evidence of A. fib.  Monitor.  Continue beta-blocker.   Echocardiogram currently pending.  Leukocytosis In the setting of fracture and stress.  Monitor.  Anemia of chronic kidney failure H&H stable.  May require transfusion postoperatively.  BPH with urinary obstruction Currently Foley catheter.  Will discontinue likely tomorrow.  Ear stuffiness, right Will attempt Afrin spray.  If no improvement will perform further work-up and treatment.     Objective Vital signs were reviewed and unremarkable.  Vitals:   01/31/21 1501 01/31/21 2051 02/01/21 0747 02/01/21 1431  BP: (!) 110/51 (!) 134/52 (!) 122/55 (!) 112/42  Pulse: 84 80 85 74  Resp: 16  16 18   Temp: 98.3 F (36.8 C) 99 F (37.2 C) 98 F (36.7 C) 99.1 F (37.3 C)  TempSrc: Oral Oral    SpO2: 99% 100% 98% 92%  Weight:      Height:       50 kg  Exam General: Appear in mild distress, no Rash; Oral Mucosa Clear, moist. no Abnormal Neck Mass Or lumps, Conjunctiva normal  Cardiovascular: S1 and S2 Present, no Murmur, Respiratory: good respiratory effort, Bilateral Air entry present and CTA, no Crackles, no wheezes Abdomen: Bowel Sound present, Soft and no tenderness Extremities: no Pedal edema Neurology: alert and oriented to time, place, and person affect appropriate. no new focal deficit Gait not checked due to patient safety concerns   Labs / Other Information EKG unremarkable.  Potassium mildly low.  Hemoglobin stable.    Time spent: 35 mins  [facility] 02/01/2021, 7:22 PM

## 2021-02-01 NOTE — Evaluation (Signed)
Occupational Therapy Evaluation Patient Details Name: Jeffrey Payne MRN: [id number] DOB: May 24, 1932 Today's Date: 02/01/2021   History of Present Illness Pt. is 87 yr old M admitted on 10/6 following fall resulting in L hip pain. Imaging (+) for impacted L fem neck fx. CT head/C-spine (-) for acute injury. Pt. underwent L partial hemiarthroplasy (ant/lat approach) on 10/7. PMH: anemia, arthritis, prostate CA, CKD, HOH, heart murmur, HTN, ESRD on HD   Clinical Impression   This 87 yo male admitted and underwent above presents to acute OT with PLOF of getting around with a rollator and needing A only for some bathing. Currently pt is limited in basic ADLs and mobility due to anterior hip precautions and pain--needing setup/S-total A for basic ADLs and min-mod A for mobility. He will continue to benefit from acute acute OT with follow up on CIR.      Recommendations for follow up therapy are one component of a multi-disciplinary discharge planning process, led by the attending physician.  Recommendations may be updated based on patient status, additional functional criteria and insurance authorization.   Follow Up Recommendations  CIR;Supervision/Assistance - 24 hour    Equipment Recommendations  3 in 1 bedside commode;Hospital bed (should not need hospital bed if goes to CIR first)       Precautions / Restrictions Precautions Precautions: Anterior Hip Precaution Booklet Issued: Yes (comment) Restrictions Weight Bearing Restrictions: No LLE Weight Bearing: Weight bearing as tolerated      Mobility Bed Mobility Overal bed mobility: Needs Assistance Bed Mobility: Supine to Sit     Supine to sit: Mod assist     General bed mobility comments: Mod A for L LE negotiation to EOB,trunk support with sitting up and use of chuck pad to scoot to EOB.    Transfers Overall transfer level: Needs assistance Equipment used: Rolling walker (2 wheeled) Transfers: Sit to/from Stand Sit to  Stand: Min assist         General transfer comment: VCs for safe hand placement    Balance Overall balance assessment: Needs assistance Sitting-balance support: No upper extremity supported;Feet supported Sitting balance-Leahy Scale: Fair     Standing balance support: Bilateral upper extremity supported;During functional activity Standing balance-Leahy Scale: Poor                             ADL either performed or assessed with clinical judgement   ADL Overall ADL's : Needs assistance/impaired Eating/Feeding: Set up;Sitting   Grooming: Set up;Sitting   Upper Body Bathing: Set up;Sitting   Lower Body Bathing: Maximal assistance Lower Body Bathing Details (indicate cue type and reason): min A sit<>stand Upper Body Dressing : Moderate assistance;Sitting   Lower Body Dressing: Total assistance Lower Body Dressing Details (indicate cue type and reason): min A sit<>stand Toilet Transfer: Minimal assistance;+2 for safety/equipment;RW;Ambulation Toilet Transfer Details (indicate cue type and reason): Simulated bed>10 steps forward>sit in recliner behind him; very slow process with VCs throughout entire 10 steps (walker, left leg, push down through arms, standup tall, now right leg) Toileting- Clothing Manipulation and Hygiene: Total assistance Toileting - Clothing Manipulation Details (indicate cue type and reason): min A sit<>stand             Vision Patient Visual Report: No change from baseline              Pertinent Vitals/Pain Pain Assessment: 0-10 Pain Score: 7  Pain Location: L LE Pain Descriptors / Indicators: Aching;Discomfort Pain Intervention(s):  Limited activity within patient's tolerance;Monitored during session;Repositioned     Hand Dominance Right   Extremity/Trunk Assessment Upper Extremity Assessment Upper Extremity Assessment: Generalized weakness           Communication Communication Communication: HOH   Cognition  Arousal/Alertness: Awake/alert   Overall Cognitive Status: Within Functional Limits for tasks assessed                                                Home Living Family/patient expects to be discharged to:: Skilled nursing facility Living Arrangements: Spouse/significant other Available Help at Discharge: Family;Available 24 hours/day Type of Home: House Home Access: Level entry     Home Layout: Two level;1/2 bath on main level Alternate Level Stairs-Number of Steps: Flight Alternate Level Stairs-Rails: Can reach both Bathroom Shower/Tub:  (1/2 bath down)   Bathroom Toilet: Standard     Home Equipment: Walker - 4 wheels          Prior Functioning/Environment Level of Independence: Independent with assistive device(s)        Comments: Pt. previously mod I with amb/functional mobility.  Would dress self but had assist with bathing.  Has had frequent falls recently.        OT Problem List: Decreased strength;Decreased range of motion;Impaired balance (sitting and/or standing);Decreased knowledge of use of DME or AE;Pain      OT Treatment/Interventions: Self-care/ADL training;DME and/or AE instruction;Patient/family education;Balance training    OT Goals(Current goals can be found in the care plan section) Acute Rehab OT Goals Patient Stated Goal: agreeable to rehab, but really wants to go home OT Goal Formulation: With patient/family Time For Goal Achievement: 02/15/21 Potential to Achieve Goals: Good  OT Frequency: Min 2X/week           Co-evaluation PT/OT/SLP Co-Evaluation/Treatment: Yes Reason for Co-Treatment: For patient/therapist safety;To address functional/ADL transfers          AM-PAC OT "6 Clicks" Daily Activity     Outcome Measure Help from another person eating meals?: None Help from another person taking care of personal grooming?: A Little Help from another person toileting, which includes using toliet, bedpan, or urinal?: A  Little Help from another person bathing (including washing, rinsing, drying)?: A Lot Help from another person to put on and taking off regular upper body clothing?: A Little Help from another person to put on and taking off regular lower body clothing?: A Lot 6 Click Score: 17   End of Session Equipment Utilized During Treatment: Gait belt;Rolling walker Nurse Communication:  (NT was in room A'ing with recliner follow, so saw how he was ambulating)  Activity Tolerance: Patient tolerated treatment well Patient left: in chair;with call bell/phone within reach;with chair alarm set;with family/visitor present  OT Visit Diagnosis: Unsteadiness on feet (R26.81);Other abnormalities of gait and mobility (R26.89);Muscle weakness (generalized) (M62.81);Pain Pain - Right/Left: Left Pain - part of body: Hip                Time: 1001-1033 OT Time Calculation (min): 32 min Charges:  OT General Charges $OT Visit: 1 Visit OT Evaluation $OT Eval Moderate Complexity: 1 Mod  [place]en Circle, OTR/L Acute [employer] Pager [phone] Office [phone]    Almon Register 02/01/2021, 10:53 AM

## 2021-02-01 NOTE — Progress Notes (Signed)
   Subjective: 2 Days Post-Op Procedure(s) (LRB): ARTHROPLASTY BIPOLAR HIP (HEMIARTHROPLASTY) (Left) Patient reports pain as mild.    Objective: Vital signs in last 24 hours: Temp:  [97.7 F (36.5 C)-99 F (37.2 C)] 98 F (36.7 C) (10/09 0747) Pulse Rate:  [69-85] 85 (10/09 0747) Resp:  [10-16] 16 (10/09 0747) BP: (110-134)/(44-61) 122/55 (10/09 0747) SpO2:  [98 %-100 %] 98 % (10/09 0747) Weight:  [50 kg] 50 kg (10/08 1030)  Intake/Output from previous day: 10/08 0701 - 10/09 0700 In: 600 [P.O.:600] Out: 2900 [Urine:400] Intake/Output this shift: No intake/output data recorded.  Recent Labs    01/30/21 0553 01/31/21 0800 02/01/21 0305  HGB 11.1* 9.7* 10.1*   Recent Labs    01/31/21 0800 02/01/21 0305  WBC 12.2* 12.3*  RBC 2.98* 3.12*  HCT 30.1* 31.0*  PLT 211 186   Recent Labs    01/31/21 0108 02/01/21 0305  NA 135 132*  K 3.7 3.4*  CL 98 93*  CO2 24 24  BUN 26* 23  CREATININE 2.92* 3.25*  GLUCOSE 117* 129*  CALCIUM 9.4 9.2   No results for input(s): LABPT, INR in the last 72 hours.  Dressing dry.  DG Abd Portable 1V  Result Date: 01/31/2021 CLINICAL DATA:  Abdominal distension EXAM: PORTABLE ABDOMEN - 1 VIEW COMPARISON:  Portable exam 1230 hours compared to CT abdomen and pelvis 04/20/2020 FINDINGS: Air-filled loops of large and small bowel throughout abdomen, with small-bowel loops upper normal caliber. No definite evidence of obstruction or wall thickening. Bones demineralized with degenerative changes of thoracolumbar spine and note of a LEFT hip prosthesis. IMPRESSION: Nonspecific bowel gas pattern with air-filled nondistended loops of bowel throughout abdomen. Electronically Signed   By: Lavonia Dana M.D.   On: 01/31/2021 14:22    Assessment/Plan: 2 Days Post-Op Procedure(s) (LRB): ARTHROPLASTY BIPOLAR HIP (HEMIARTHROPLASTY) (Left) Up with therapy WBAT  slow progress expected.   Marybelle Killings 02/01/2021, 9:15 AM

## 2021-02-01 NOTE — Progress Notes (Signed)
Inpatient Rehab Admissions Coordinator Note:   Per PT/OT patient was screened for CIR candidacy by Angelia Hazell Danford Bad, CCC-SLP. At this time, pt appears to be a potential candidate for CIR. I will place an order for rehab consult for full assessment, per our protocol.  Please contact me any with questions.Gayland Curry, [place], [place] Admissions Coordinator [phone] 02/01/21 6:07 PM

## 2021-02-01 NOTE — Assessment & Plan Note (Addendum)
Echocardiogram shows worsening EF from prior echocardiogram.  Patient is on Toprol-XL and aspirin. [place] cardiology consultation.  Patient is very frail.  Suspected ischemic etiology but currently recommend medical management.  GDMT per cardiology.

## 2021-02-01 NOTE — Anesthesia Postprocedure Evaluation (Signed)
Anesthesia Post Note  Patient: Jeffrey Payne  Procedure(s) Performed: ARTHROPLASTY BIPOLAR HIP (HEMIARTHROPLASTY) (Left: Hip)     Patient location during evaluation: PACU Anesthesia Type: General Level of consciousness: awake and alert Pain management: pain level controlled Vital Signs Assessment: post-procedure vital signs reviewed and stable Respiratory status: spontaneous breathing, nonlabored ventilation, respiratory function stable and patient connected to nasal cannula oxygen Cardiovascular status: blood pressure returned to baseline and stable Postop Assessment: no apparent nausea or vomiting Anesthetic complications: no   No notable events documented.        Effie Berkshire

## 2021-02-01 NOTE — Progress Notes (Addendum)
[facility] Progress Note   Subjective: Seen in room. No C/Os. Up in chair. Next HD 02/03/2021  Objective Vitals:   01/31/21 1156 01/31/21 1501 01/31/21 2051 02/01/21 0747  BP: (!) 131/44 (!) 110/51 (!) 134/52 (!) 122/55  Pulse: 84 84 80 85  Resp: 15 16  16   Temp: 98.6 F (37 C) 98.3 F (36.8 C) 99 F (37.2 C) 98 F (36.7 C)  TempSrc: Oral Oral Oral   SpO2: 99% 99% 100% 98%  Weight:      Height:       Physical Exam General:Pleasant very elderly male in NAD Heart: S1,S2 RRR No M/R/G Lungs: CTAB Anteriorly. No WOB.  Abdomen: Soft, NABS. Foley patent to gravity drain, clear yellow urine.  Extremities: No LE edema. Dialysis Access: AVF + T/B Hematoma upper portion on AVF but doesn't seem to be compromising flow. Cannulate distal to hematoma.    Additional Objective Labs: Basic Metabolic Panel: Recent Labs  Lab 01/30/21 0553 01/31/21 0108 02/01/21 0305  NA 134* 135 132*  K 3.7 3.7 3.4*  CL 95* 98 93*  CO2 26 24 24   GLUCOSE 96 117* 129*  BUN 35* 26* 23  CREATININE 3.44* 2.92* 3.25*  CALCIUM 10.0 9.4 9.2  PHOS 5.0* 5.8* 5.6*   Liver Function Tests: Recent Labs  Lab 01/29/21 0840 01/30/21 0553 01/31/21 0108 02/01/21 0305  AST 25  --   --   --   ALT 15  --   --   --   ALKPHOS 113  --   --   --   BILITOT 0.9  --   --   --   PROT 5.7*  --   --   --   ALBUMIN 3.1* 3.0* 2.9* 2.6*   Recent Labs  Lab 01/29/21 0840  LIPASE 27   CBC: Recent Labs  Lab 01/29/21 0840 01/30/21 0553 01/31/21 0800 02/01/21 0305  WBC 15.6* 12.6* 12.2* 12.3*  NEUTROABS 12.5*  --   --   --   HGB 11.1* 11.1* 9.7* 10.1*  HCT 34.0* 33.6* 30.1* 31.0*  MCV 100.0 98.8 101.0* 99.4  PLT 276 272 211 186   Blood Culture    Component Value Date/Time   SDES  04/21/2020 0328    BLOOD RIGHT HAND Performed at [facility], [place] [address], [place], [place] [address]    SPECREQUEST  04/21/2020 0328    BOTTLES DRAWN AEROBIC ONLY Blood Culture adequate  volume Performed at [place] [address], [place], [place] [address]    CULT  04/21/2020 0328    NO GROWTH 5 DAYS Performed at [facility], [place] [address], [place], [place] [address]    REPTSTATUS 04/26/2020 FINAL 04/21/2020 0328    Cardiac Enzymes: No results for input(s): CKTOTAL, CKMB, CKMBINDEX, TROPONINI in the last 168 hours. CBG: Recent Labs  Lab 01/31/21 1157  GLUCAP 112*   Iron Studies: No results for input(s): IRON, TIBC, TRANSFERRIN, FERRITIN in the last 72 hours. @lablastinr3 @ Studies/Results: DG CHEST PORT 1 VIEW  Result Date: 02/01/2021 CLINICAL DATA:  Follow-up exam. Patient has a history of heart murmur and hypertension. EXAM: PORTABLE CHEST 1 VIEW COMPARISON:  January 29, 2021 FINDINGS: The heart size and mediastinal contours are stable. The heart size enlarged. Central pulmonary vascular congestion is identified slightly improved compared prior exam. The visualized skeletal structures are stable. IMPRESSION: Congestive heart failure slightly improved compared prior exam. Electronically Signed   By: Mallie Darting.D.  On: 02/01/2021 09:25   DG Abd Portable 1V  Result Date: 01/31/2021 CLINICAL DATA:  Abdominal distension EXAM: PORTABLE ABDOMEN - 1 VIEW COMPARISON:  Portable exam 1230 hours compared to CT abdomen and pelvis 04/20/2020 FINDINGS: Air-filled loops of large and small bowel throughout abdomen, with small-bowel loops upper normal caliber. No definite evidence of obstruction or wall thickening. Bones demineralized with degenerative changes of thoracolumbar spine and note of a LEFT hip prosthesis. IMPRESSION: Nonspecific bowel gas pattern with air-filled nondistended loops of bowel throughout abdomen. Electronically Signed   By: Lavonia Dana M.D.   On: 01/31/2021 14:22   DG HIP UNILAT WITH PELVIS 1V LEFT  Result Date: 01/30/2021 CLINICAL DATA:  Left hip hemiarthroplasty EXAM: DG HIP (WITH OR WITHOUT PELVIS) 1V*L* COMPARISON:   01/29/2021 FINDINGS: Single frontal view of the left hip demonstrates interval placement of a left hip hemiarthroplasty in the expected position without signs of acute complication. Postsurgical changes in the soft tissues. IMPRESSION: 1. Unremarkable left hip hemiarthroplasty. Electronically Signed   By: Randa Ngo M.D.   On: 01/30/2021 19:07   Medications:   aspirin EC  81 mg Oral Daily   Chlorhexidine Gluconate Cloth  6 each Topical Q0600   heparin  5,000 Units Subcutaneous Q8H   loratadine  10 mg Oral Daily   metoprolol succinate  50 mg Oral QPM   mupirocin ointment   Topical BID   oxymetazoline  1 spray Each Nare BID   senna-docusate  1 tablet Oral QHS   simethicone  80 mg Oral QID   tamsulosin  0.4 mg Oral BID     Dialysis Orders: TTS at [facility] 4hr, 400/700, EDW 54.5kg, 2K/2Ca, R AVF,  - heparin 4000 units IV TIW - No ESA, VDRA (fairly recent start)   Assessment/Plan: 1. L hip fracture s/p fall: s/pL hemiarthroplasty 01/30/2021 by ortho. 2. ESRD: Continue HD on TTS schedule. Next HD 02/03/2021. 3. HTN/volume: Mild hypoxia/pulm edem on admit - s/p HD overnight 01/30/2021. Volume seems improved today.  Will continue to UF as tolerated.  4. Anemia: Hgb 11.1 on admit. Down to 9.7 10/08. Given Aranesp 25 mcg IV 01/31/21 Follow HGB. Transfuse if needed.  5. Secondary hyperparathyroidism: CorrCa high, Phos ok. No binders or VDRA. Use 2.0 Ca bath.  6. Nutrition: Alb low, adding protein supplements 7. Hx urinary retention: has foley catheter. Per primary 8. MAT: seen on admission, not in Afib.  SR with PACS at present. per primary. On BB.  9. Leukocytosis: Unclear, per primary 10. Scalp laceration s/p fall  Rita H. Brown NP-C 02/01/2021, 10:52 AM  [facility] [phone]  Pt seen, examined and agree w A/P as above.  Kelly Splinter  MD 02/01/2021, 3:19 PM

## 2021-02-02 ENCOUNTER — Encounter (HOSPITAL_COMMUNITY): Payer: Self-pay | Admitting: Internal Medicine

## 2021-02-02 ENCOUNTER — Inpatient Hospital Stay (HOSPITAL_COMMUNITY): Payer: Medicare Other

## 2021-02-02 DIAGNOSIS — K567 Ileus, unspecified: Secondary | ICD-10-CM | POA: Diagnosis not present

## 2021-02-02 DIAGNOSIS — I5043 Acute on chronic combined systolic (congestive) and diastolic (congestive) heart failure: Secondary | ICD-10-CM | POA: Diagnosis not present

## 2021-02-02 DIAGNOSIS — I447 Left bundle-branch block, unspecified: Secondary | ICD-10-CM

## 2021-02-02 DIAGNOSIS — S72002A Fracture of unspecified part of neck of left femur, initial encounter for closed fracture: Secondary | ICD-10-CM | POA: Diagnosis not present

## 2021-02-02 HISTORY — DX: Ileus, unspecified: K56.7

## 2021-02-02 LAB — RENAL FUNCTION PANEL
Albumin: 2.5 g/dL — ABNORMAL LOW (ref 3.5–5.0)
Anion gap: 14 (ref 5–15)
BUN: 39 mg/dL — ABNORMAL HIGH (ref 8–23)
CO2: 27 mmol/L (ref 22–32)
Calcium: 9.5 mg/dL (ref 8.9–10.3)
Chloride: 91 mmol/L — ABNORMAL LOW (ref 98–111)
Creatinine, Ser: 4.55 mg/dL — ABNORMAL HIGH (ref 0.61–1.24)
GFR, Estimated: 12 mL/min — ABNORMAL LOW (ref >60)
Glucose, Bld: 125 mg/dL — ABNORMAL HIGH (ref 70–99)
Phosphorus: 6.4 mg/dL — ABNORMAL HIGH (ref 2.5–4.6)
Potassium: 3.8 mmol/L (ref 3.5–5.1)
Sodium: 132 mmol/L — ABNORMAL LOW (ref 135–145)

## 2021-02-02 LAB — CBC
HCT: 29.9 % — ABNORMAL LOW (ref 39.0–52.0)
Hemoglobin: 10 g/dL — ABNORMAL LOW (ref 13.0–17.0)
MCH: 32.6 pg (ref 26.0–34.0)
MCHC: 33.4 g/dL (ref 30.0–36.0)
MCV: 97.4 fL (ref 80.0–100.0)
Platelets: 230 10*3/uL (ref 150–400)
RBC: 3.07 MIL/uL — ABNORMAL LOW (ref 4.22–5.81)
RDW: 13.6 % (ref 11.5–15.5)
WBC: 13 10*3/uL — ABNORMAL HIGH (ref 4.0–10.5)
nRBC: 0 % (ref 0.0–0.2)

## 2021-02-02 MED ORDER — IOHEXOL 9 MG/ML PO SOLN
ORAL | Status: AC
  Administered 2021-02-02: 500 mL
  Filled 2021-02-02: qty 1000

## 2021-02-02 NOTE — Progress Notes (Signed)
Pt's chart reviewed. Notes indicate that pt may need rehab at d/c. Pt receives out-pt HD at [facility] on TTS. Pt's arrival time is 11:30 with 11:45 chair time. Will assist as needed.   Melven Sartorius Renal Navigator [phone]

## 2021-02-02 NOTE — Progress Notes (Signed)
Progress Note    [facility]rey [facility]   IRC:[id number]  DOB: 1932/10/21  DOA: 01/29/2021     4 Date of Service: 02/02/2021   Subjective:  No nausea no vomiting no fever no chills.  Reports abdominal distention as well as pain.  Not passing gas.  No BM.  Hospital Problems * Closed fracture of neck of left femur (HCC) Mechanical fall at home. Orthopedic consulted. Intermediate surgical risk. Underwent  left hip hemiarthroplasty. Tolerated surgery very well.  Postop weightbearing as tolerated.  Pain management per orthopedic.  Heparin for DVT prophylaxis. PT recommends home with home health  Ileus, unspecified ([place]) Continues to have abdominal distention.  X-ray abdomen the time of admission showed evidence of gaseous distention.  Due to decreasing bowel sound and abdominal pain CT abdomen performed on 10/10.  Confirms a diagnosis of ileus without any obstruction.  Continue n.p.o. for now.  May require NG decompression. Repeat x-ray on 10/11.  Acute combined systolic and diastolic congestive heart failure (HCC) Echocardiogram shows worsening EF from prior echocardiogram.  Patient is on Toprol-XL and aspirin. [place] cardiology consultation.  Patient is very frail.  Suspected ischemic etiology but currently recommend medical management.  GDMT per cardiology.  Scalp laceration Wound care consulted.  Mupirocin ointment and foam dressing.  Fall at home, initial encounter Mechanical fall.  PT OT consulted.  Currently recommending home with home family prefers to go home versus SNF.  ESRD (end stage renal disease) ([place]) HD TTS.  Appreciate nephrology input.  Volume management Per  Nephrology.  Hypoxia Does not use oxygen at baseline.  Was 88% on room air on admission.  Currently on 2 L of oxygen.  Anticipating improvement with regular HD.  LBBB (left bundle branch block) New onset.  No prior history of LBBB.  Chart reviewed.  Does not have any complaints of angina, chest pain, shortness  of breath or similar symptoms at rest or on exertion prior to admission.  Suspect this is incidental finding.  Echocardiogram currently pending.  No further work-up until echocardiogram is back.  Multifocal atrial tachycardia (HCC) Seen on admission H&P EKG.  Do not see any evidence of A. fib.  Monitor.  Continue beta-blocker.  Echocardiogram currently pending.  Leukocytosis In the setting of fracture and stress.  Monitor.  Anemia of chronic kidney failure H&H stable.  May require transfusion postoperatively.  BPH with urinary obstruction Currently Foley catheter.  Will discontinue likely tomorrow.  Ear stuffiness, right Resolved with Afrin spray.     Objective Vital signs were reviewed and unremarkable.  Vitals:   02/01/21 1431 02/01/21 2027 02/02/21 0843 02/02/21 1218  BP: (!) 112/42 (!) 119/53 (!) 127/48 (!) 148/66  Pulse: 74 84 70 77  Resp: 18  18 18   Temp: 99.1 F (37.3 C) 97.8 F (36.6 C) 98.4 F (36.9 C) 97.6 F (36.4 C)  TempSrc:  Oral Oral Oral  SpO2: 92% 90% 90% 92%  Weight:      Height:       50 kg  Exam General: Appear in mild distress, no Rash; Oral Mucosa Clear, moist. no Abnormal Neck Mass Or lumps, Conjunctiva normal  Cardiovascular: S1 and S2 Present, no Murmur, Respiratory: good respiratory effort, Bilateral Air entry present and CTA, no Crackles, no wheezes Abdomen: Bowel Sound sluggish, distended, diffuse abdominal tenderness. Extremities: Trace pedal edema Neurology: alert and oriented to time, place, and person affect appropriate. no new focal deficit Gait not checked due to patient safety concerns   Labs / Other Information CT  abdomen shows ileus.  Hemoglobin stable.  Leukocytosis mildly worsening.  Time spent: 35 mins [facility] 02/02/2021, 8:38 PM

## 2021-02-02 NOTE — TOC Progression Note (Signed)
Transition of Care [facility]) - Initial/Assessment Note    Patient Details  Name: Jeffrey [facility] MRN: [id number] Date of Birth: Dec 23, 1932  [facility]) CM/SW Contact:    Milinda Antis, [place] Phone Number: 02/02/2021, 4:23 PM  Clinical Narrative:                 CSW gave the patient's wife current bed offers and informed the patient that [facility] and [facility] did not extend bed offers.  The spouse requested that CSW contact [facility] and [facility].  CSW contacted admissions at [facility] and [facility].  Voicemail's were left with both facilities requesting a returned call after the SNF referral was reviewed.    Expected Discharge Plan: Skilled Nursing Facility Barriers to Discharge: Insurance Authorization   Patient Goals and CMS Choice   CMS Medicare.gov Compare Post Acute Care list provided to:: Patient Represenative (must comment) Choice offered to / list presented to : Spouse  Expected Discharge Plan and Services Expected Discharge Plan: [place]   Discharge Planning Services: CM Consult Post Acute Care Choice: Durable Medical Equipment, Home Health Living arrangements for the past 2 months: Single Family Home                 DME Arranged: 3-N-1                    Prior Living Arrangements/Services Living arrangements for the past 2 months: Single Family Home Lives with:: Spouse Patient language and need for interpreter reviewed:: Yes Do you feel safe going back to the place where you live?: Yes      Need for Family Participation in Patient Care: Yes (Comment) Care giver support system in place?: Yes (comment)   Criminal Activity/Legal Involvement Pertinent to Current Situation/Hospitalization: No - Comment as needed  Activities of Daily Living Home Assistive Devices/Equipment: Walker (specify type) ADL Screening (condition at time of admission) Patient's cognitive ability adequate to safely complete daily activities?:  Yes Is the patient deaf or have difficulty hearing?: Yes Does the patient have difficulty seeing, even when wearing glasses/contacts?: No Does the patient have difficulty concentrating, remembering, or making decisions?: No Patient able to express need for assistance with ADLs?: Yes Does the patient have difficulty dressing or bathing?: Yes Independently performs ADLs?: No Communication: Independent Dressing (OT): Needs assistance Is this a change from baseline?: Pre-admission baseline Grooming: Needs assistance Is this a change from baseline?: Pre-admission baseline Feeding: Independent Bathing: Needs assistance Is this a change from baseline?: Pre-admission baseline Toileting: Independent In/Out Bed: Independent Walks in Home: Independent Does the patient have difficulty walking or climbing stairs?: Yes Weakness of Legs: Left Weakness of Arms/Hands: None  Permission Sought/Granted                  Emotional Assessment       Orientation: : Oriented to Self, Oriented to Place, Oriented to  Time Alcohol / Substance Use: Not Applicable Psych Involvement: No (comment)  Admission diagnosis:  Femoral neck fracture ([place]) [S72.009A] Fall [W19.XXXA] Closed fracture of left hip, initial encounter ([place]) [S72.002A] Injury of head, initial encounter [S09.90XA] Fall, initial encounter [W19.XXXA] Patient Active Problem List   Diagnosis Date Noted   Acute combined systolic and diastolic congestive heart failure ([place]) 02/01/2021   Ear stuffiness, right 01/31/2021   Closed fracture of neck of left femur ([place]) 01/30/2021   Scalp laceration 01/30/2021   Hypoxia 01/30/2021   Multifocal atrial tachycardia ([place]) 01/30/2021   LBBB (left bundle branch  block) 01/30/2021   ESRD (end stage renal disease) ([place]) 01/30/2021   Fall at home, initial encounter 01/29/2021   BPH with urinary obstruction 01/29/2021   Anemia of chronic kidney failure 01/29/2021   Leukocytosis 01/29/2021    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   Allergic rhinitis due to pollen 09/09/2020   Gastroesophageal reflux disease 09/09/2020   Hyperlipidemia 09/09/2020   Hyperparathyroidism due to renal insufficiency ([place]) 09/09/2020   Osteoarthritis of knee 55/21/7471   Systolic murmur 59/53/9672   AKI (acute kidney injury) ([place]) 04/20/2020   Acute kidney injury superimposed on CKD ([place]) 10/06/2019   CKD (chronic kidney disease), stage IV ([place]) 10/05/2019   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[place]) 10/05/2019   Bradycardia 10/05/2019   Hypotension 10/05/2019   Fatigue 10/05/2019   Hypertension    Heart murmur    PCP:  Kristen Loader, FNP Pharmacy:   [id number] - [place], [place] - [address] AT [place] [address] [place] [address] Phone: [phone] Fax: [phone]  [facility] - [place], [place] - [address], [address] [place] [address] Phone: [phone] Fax: [phone]     Social Determinants of Health (SDOH) Interventions    Readmission Risk Interventions No flowsheet data found.

## 2021-02-02 NOTE — Progress Notes (Signed)
Inpatient Rehabilitation Admissions Coordinator   Inpatient rehab consult received. I will meet with patient and wife this morning to discus rehab venue options.  Danne Baxter, RN, MSN Rehab Admissions Coordinator [phone] 02/02/2021 8:34 AM

## 2021-02-02 NOTE — NC FL2 (Signed)
[place] MEDICAID FL2 LEVEL OF CARE SCREENING TOOL     IDENTIFICATION  Patient Name: Jeffrey [facility] Birthdate: [birth year] Sex: male Admission Date (Current Location): 01/29/2021  [facility] and [place] Number:  Herbalist and Address:  [facility], [place] [address], [place], [place] [address]      Provider Number: [id number]  Attending Physician Name and Address:  Jeffrey Hamman, MD  Relative Name and Phone Number:  Jeffrey, Payne (Spouse)   [phone]    Current Level of Care: Hospital Recommended Level of Care: [place] Prior Approval Number:    Date Approved/Denied:   PASRR Number: [id number] Payne  Discharge Plan: SNF    Current Diagnoses: Patient Active Problem List   Diagnosis Date Noted   Acute combined systolic and diastolic congestive heart failure ([place]) 02/01/2021   Ear stuffiness, right 01/31/2021   Closed fracture of neck of left femur ([place]) 01/30/2021   Scalp laceration 01/30/2021   Hypoxia 01/30/2021   Multifocal atrial tachycardia ([place]) 01/30/2021   LBBB (left bundle branch block) 01/30/2021   ESRD (end stage renal disease) ([place]) 01/30/2021   Fall at home, initial encounter 01/29/2021   BPH with urinary obstruction 01/29/2021   Anemia of chronic kidney failure 01/29/2021   Leukocytosis 01/29/2021   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   Allergic rhinitis due to pollen 09/09/2020   Gastroesophageal reflux disease 09/09/2020   Hyperlipidemia 09/09/2020   Hyperparathyroidism due to renal insufficiency ([place]) 09/09/2020   Osteoarthritis of knee 89/21/1941   Systolic murmur 74/11/1446   AKI (acute kidney injury) ([place]) 04/20/2020   Acute kidney injury superimposed on CKD ([place]) 10/06/2019   CKD (chronic kidney disease), stage IV ([place]) 10/05/2019   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[place]) 10/05/2019   Bradycardia 10/05/2019   Hypotension 10/05/2019   Fatigue 10/05/2019   Hypertension    Heart  murmur     Orientation RESPIRATION BLADDER Height & Weight     Situation, Time, Self  Normal Continent Weight: 110 lb 3.7 oz (50 kg) Height:  5\' 7"  (170.2 cm)  BEHAVIORAL SYMPTOMS/MOOD NEUROLOGICAL BOWEL NUTRITION STATUS      Continent Diet (see d/c summary)  AMBULATORY STATUS COMMUNICATION OF NEEDS Skin   Limited Assist Verbally Surgical wounds                       Personal Care Assistance Level of Assistance  Bathing, Feeding, Dressing Bathing Assistance: Independent Feeding assistance: Independent Dressing Assistance: Limited assistance     Functional Limitations Info  Speech, Hearing, Sight Sight Info: Adequate Hearing Info: Impaired Speech Info: Adequate    SPECIAL CARE FACTORS FREQUENCY  PT (By licensed PT), OT (By licensed OT)     PT Frequency: 5x/ week OT Frequency: 5x/ week            Contractures Contractures Info: Not present    Additional Factors Info  Code Status, Allergies Code Status Info: Full Allergies Info: NKA           Current Medications (02/02/2021):  This is the current hospital active medication list Current Facility-Administered Medications  Medication Dose Route Frequency Provider Last Rate Last Admin   aspirin EC tablet 81 mg  81 mg Oral Daily Jeffrey Hamman, MD   81 mg at 02/02/21 1009   Chlorhexidine Gluconate Cloth 2 % PADS 6 each  6 each Topical Q0600 Jeffrey Mulders, MD   6 each at 02/02/21 0606   feeding supplement (NEPRO CARB STEADY) liquid 237 mL  237 mL Oral TID BM Jeffrey Hamman, MD       fentaNYL (SUBLIMAZE) injection 25 mcg  25 mcg Intravenous Q2H PRN Jeffrey Mulders, MD   25 mcg at 01/29/21 2010   heparin injection 5,000 Units  5,000 Units Subcutaneous Q8H Jeffrey Mulders, MD   5,000 Units at 02/02/21 0606   metoprolol succinate (TOPROL-XL) 24 hr tablet 50 mg  50 mg Oral QPM Payne, Jeffrey A, MD   50 mg at 02/01/21 1854   mupirocin ointment (BACTROBAN) 2 %   Topical BID Jeffrey Mulders, MD   Given at 02/02/21  1011   oxyCODONE (Oxy IR/ROXICODONE) immediate release tablet 5 mg  5 mg Oral Q4H PRN Jeffrey Plan A, MD   5 mg at 02/02/21 1010   tamsulosin (FLOMAX) capsule 0.4 mg  0.4 mg Oral BID Jeffrey Plan A, MD   0.4 mg at 02/02/21 1010     Discharge Medications: Please see discharge summary for Payne list of discharge medications.  Relevant Imaging Results:  Relevant Lab Results:   Additional Information SSN: [id number]; Pfizer COVID-19 Vaccine 02/05/2020 , 06/01/2019 , 05/13/2019;  Dialysis patient: TTS 11:30 arrival 11:45 seat time at [facility];   5\' 7"  110lbs  Jeffrey Payne, LCSWA

## 2021-02-02 NOTE — TOC Initial Note (Addendum)
Transition of Care [facility]) - Initial/Assessment Note    Patient Details  Name: Jeffrey Payne MRN: [id number] Date of Birth: Nov 07, 1932  Transition of Care [facility]) CM/SW Contact:    Milinda Antis, [place] Phone Number: 02/02/2021, 2:04 PM  Clinical Narrative:                 CSW received consult for possible SNF placement at time of discharge. CSW spoke with patient's wife and daughter.  Patient's family expressed understanding of PT recommendation and is agreeable to SNF placement at time of discharge. Patient's family reports preference for [facility] or [facility] . CSW discussed insurance authorization process and provided Medicare SNF ratings list. Patient has received 3 COVID vaccines.  No further questions reported at this time.   CSW requested TOC CMA's begin insurance auth. Skilled Nursing Rehab Facilities-   [email or web address] Ratings out of 5 possible    Name Address  Phone # [place] Inspection Overall  [facility] [address] [place] 5 1 4 4   [facility]  [address], [place] [phone] 3 1 5 4   [facility] [place], [place] 3 1 1 1   [facility] [place], [place] 2 2 4 4   [facility] [address] [place] 3 1 2 1   [place] 3 2 4 4   [facility] [address], [place] 5 1 2 2   [facility] [address] [place] 5 2 2 3   [facility] 5 1 2 2   [facility] [phone], [place] [phone] 5 1 2 2   [facility] [address] [facility] [phone] 5 1 2 2   [facility] [address] [place] [phone] 3 1 1 1      Expected Discharge Plan: Skilled Nursing Facility Barriers to  Discharge: Insurance Authorization   Patient Goals and CMS Choice   CMS Medicare.gov Compare Post Acute Care list provided to:: Patient Represenative (must comment) Choice offered to / list presented to : Spouse  Expected Discharge Plan and Services Expected Discharge Plan: [place]   Discharge Planning Services: CM Consult Post Acute Care Choice: Durable Medical Equipment, Home Health Living arrangements for the past 2 months: Single Family Home                 DME Arranged: 3-N-1                    Prior Living Arrangements/Services Living arrangements for the past 2 months: Single Family Home Lives with:: Spouse Patient language and need for interpreter reviewed:: Yes Do you feel safe going back to the place where you live?: Yes      Need for Family Participation in Patient Care: Yes (Comment) Care giver support system in place?: Yes (comment)   Criminal Activity/Legal Involvement Pertinent to Current Situation/Hospitalization: No - Comment as needed  Activities of Daily Living Home Assistive Devices/Equipment: [place] Rile (specify type) ADL Screening (condition at time of admission) Patient's cognitive ability adequate to safely complete daily activities?: Yes Is the patient deaf or have difficulty hearing?: Yes Does the patient have difficulty seeing, even when wearing glasses/contacts?: No Does the patient have difficulty concentrating, remembering, or making decisions?: No Patient able to express need for assistance with ADLs?: Yes Does the patient have difficulty dressing or bathing?: Yes Independently  performs ADLs?: No Communication: Independent Dressing (OT): Needs assistance Is this a change from baseline?: Pre-admission baseline Grooming: Needs assistance Is this a change from baseline?: Pre-admission baseline Feeding: Independent Bathing: Needs assistance Is this a change from baseline?: Pre-admission baseline Toileting:  Independent In/Out Bed: Independent Walks in Home: Independent Does the patient have difficulty walking or climbing stairs?: Yes Weakness of Legs: Left Weakness of Arms/Hands: None  Permission Sought/Granted                  Emotional Assessment       Orientation: : Oriented to Self, Oriented to Place, Oriented to  Time Alcohol / Substance Use: Not Applicable Psych Involvement: No (comment)  Admission diagnosis:  Femoral neck fracture ([place]) [S72.009A] Fall [W19.XXXA] Closed fracture of left hip, initial encounter ([place]) [S72.002A] Injury of head, initial encounter [S09.90XA] Fall, initial encounter [W19.XXXA] Patient Active Problem List   Diagnosis Date Noted   Acute combined systolic and diastolic congestive heart failure ([place]) 02/01/2021   Ear stuffiness, right 01/31/2021   Closed fracture of neck of left femur ([place]) 01/30/2021   Scalp laceration 01/30/2021   Hypoxia 01/30/2021   Multifocal atrial tachycardia ([place]) 01/30/2021   LBBB (left bundle branch block) 01/30/2021   ESRD (end stage renal disease) ([place]) 01/30/2021   Fall at home, initial encounter 01/29/2021   BPH with urinary obstruction 01/29/2021   Anemia of chronic kidney failure 01/29/2021   Leukocytosis 01/29/2021   Postinflammatory pulmonary fibrosis ([place]) 09/09/2020   Allergic rhinitis due to pollen 09/09/2020   Gastroesophageal reflux disease 09/09/2020   Hyperlipidemia 09/09/2020   Hyperparathyroidism due to renal insufficiency ([place]) 09/09/2020   Osteoarthritis of knee 16/01/9603   Systolic murmur 54/12/8117   AKI (acute kidney injury) ([place]) 04/20/2020   Acute kidney injury superimposed on CKD ([place]) 10/06/2019   CKD (chronic kidney disease), stage IV ([place]) 10/05/2019   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[place]) 10/05/2019   Bradycardia 10/05/2019   Hypotension 10/05/2019   Fatigue 10/05/2019   Hypertension    Heart murmur    PCP:  Kristen Loader, FNP Pharmacy:   [id number] -  [place], [place] - [address] [place] [place] [address] Phone: [phone] Fax: [phone]  [facility] [place], [place] - [address] [employer] [address], [address] [place] [address] Phone: [phone] Fax: [phone]     Social Determinants of Health (SDOH) Interventions    Readmission Risk Interventions No flowsheet data found.

## 2021-02-02 NOTE — Assessment & Plan Note (Addendum)
Continues to have abdominal distention.  X-ray abdomen the time of admission showed evidence of gaseous distention.  Due to decreasing bowel sound and abdominal pain CT abdomen performed on 10/10.  Confirms a diagnosis of ileus without any obstruction.  Continue n.p.o. for now.  Insert NG. May require surgical consultation if does not improve.

## 2021-02-02 NOTE — Progress Notes (Signed)
Physical Therapy Treatment Patient Details Name: Jeffrey Payne MRN: [id number] DOB: [birth year] Today's Date: 02/02/2021   History of Present Illness Pt. is 87 yr old M admitted on 10/6 following fall resulting in L hip pain. Imaging (+) for impacted L fem neck fx. CT head/C-spine (-) for acute injury. Pt. underwent L partial hemiarthroplasy (ant/lat approach) on 10/7. PMH: anemia, arthritis, prostate CA, CKD, HOH, heart murmur, HTN, ESRD on HD    PT Comments    Pt. Demos inc difficulty with advancing LE's today, appears stiff and lethargic from just waking up.  Only able to complete stand pivot transfer with min A.  Demos good tolerance to LE therex and is able to complete some exercises with assist only to remind pt. To not ER.  Pt. Continues to demo transfer difficulty, gait deviations, dec balance and LE weakness and would benefit from continued skilled PT to in acute care to address deficits indicated.  CIR remains current safest D/C plan at this time.  Recommendations for follow up therapy are one component of a multi-disciplinary discharge planning process, led by the attending physician.  Recommendations may be updated based on patient status, additional functional criteria and insurance authorization.  Follow Up Recommendations        Equipment Recommendations  Rolling walker with 5" wheels;3in1 (PT)    Recommendations for Other Services       Precautions / Restrictions Precautions Precautions: Anterior Hip Precaution Comments: Precaution sheet in room Restrictions Weight Bearing Restrictions: Yes LLE Weight Bearing: Weight bearing as tolerated     Mobility  Bed Mobility Overal bed mobility: Needs Assistance       Supine to sit: Mod assist     General bed mobility comments: Pt. requires mod A for LE negotiation, trunk support and use of chuck pad to scoot to EOB.  Needs VC reminders not to ER L LE. Patient Response: Cooperative  Transfers Overall transfer level:  Needs assistance Equipment used: Rolling walker (2 wheeled)   Sit to Stand: Min assist Stand pivot transfers: Min assist       General transfer comment: Performs sit > stand x 2.  1st attempts requires mod A and VC reminders for safety with hand placement.  2nd attempt able to complete with min A.  Demos fair standing balance with use of RW.  Attempt to amb in room, but pt. demos inability to advance R LE.  Instead, chair is brought closer and pt. is able to pivot only with min A.  VCs to help assist with weightshifting to unweight L LE to advance forward, but unable to successfully weightshift to L side to lift R LE.  Instead pt. scoots R LE across floor.  Needs frequent VCs to limit ER.  Ambulation/Gait                 Stairs             Wheelchair Mobility    Modified Rankin (Stroke Patients Only)       Balance Overall balance assessment: Needs assistance         Standing balance support: During functional activity;Bilateral upper extremity supported Standing balance-Leahy Scale: Poor                              Cognition Arousal/Alertness: Lethargic Behavior During Therapy: WFL for tasks assessed/performed Overall Cognitive Status: Within Functional Limits for tasks assessed  General Comments: Pt. just woke up, but agreeable to work with PT.      Exercises Total Joint Exercises Ankle Circles/Pumps: AROM;Both;20 reps Heel Slides: AAROM;Left;20 reps Straight Leg Raises: AAROM;Left;20 reps Long Arc Quad: AAROM;Left;20 reps Marching in Standing: Seated;AROM;Left;20 reps    General Comments        Pertinent Vitals/Pain Pain Assessment: 0-10 Pain Score: 4  Pain Location: L hip Pain Descriptors / Indicators: Discomfort;Tender;Aching Pain Intervention(s): Limited activity within patient's tolerance;Monitored during session;Repositioned    Home Living                      Prior  Function            PT Goals (current goals can now be found in the care plan section) Progress towards PT goals: Progressing toward goals    Frequency           PT Plan Current plan remains appropriate    Co-evaluation              AM-PAC PT "6 Clicks" Mobility   Outcome Measure  Help needed turning from your back to your side while in a flat bed without using bedrails?: A Little Help needed moving from lying on your back to sitting on the side of a flat bed without using bedrails?: A Lot Help needed moving to and from a bed to a chair (including a wheelchair)?: A Little Help needed standing up from a chair using your arms (e.g., wheelchair or bedside chair)?: A Little Help needed to walk in hospital room?: A Lot Help needed climbing 3-5 steps with a railing? : Total 6 Click Score: 14    End of Session Equipment Utilized During Treatment: Gait belt Activity Tolerance: Patient tolerated treatment well;Patient limited by pain Patient left: in chair;with nursing/sitter in room;with chair alarm set;with call bell/phone within reach   Pain - part of body: Hip     Time: 2671-2458 PT Time Calculation (min) (ACUTE ONLY): 32 min  Charges:  $Therapeutic Exercise: 8-22 mins $Therapeutic Activity: 8-22 mins                     Dariusz Brase A. Yareth Kearse, PT, DPT Acute Rehabilitation Services Office: [place] 02/02/2021, 9:56 AM

## 2021-02-02 NOTE — Progress Notes (Signed)
Inpatient Rehabilitation Admissions Coordinator   I met with wife and patient at bedside. I discussed goals and expectations of a possible CIR admit. With further discussions, his wife does not feel he can tolerate the intensity required of CIR. It is also unlikely that [place] will approve for his diagnosis. I recommend SNF and wife in agreement. I have alerted acute team and TOC, Heather and Reandra. We will sign off at this time.  Danne Baxter, RN, MSN Rehab Admissions Coordinator [phone] 02/02/2021 11:16 AM

## 2021-02-02 NOTE — Progress Notes (Signed)
[facility] Progress Note   Subjective: Seen in room. No complaints/acute events overnight.  Objective Vitals:   02/01/21 0747 02/01/21 1431 02/01/21 2027 02/02/21 0843  BP: (!) 122/55 (!) 112/42 (!) 119/53 (!) 127/48  Pulse: 85 74 84 70  Resp: 16 18  18   Temp: 98 F (36.7 C) 99.1 F (37.3 C) 97.8 F (36.6 C) 98.4 F (36.9 C)  TempSrc:   Oral Oral  SpO2: 98% 92% 90% 90%  Weight:      Height:       Physical Exam General: NAD Heart: S1,S2 RRR No M/R/G Lungs: CTAB Anteriorly. No WOB.  Abdomen: Soft, NABS. Foley patent to gravity drain, clear yellow urine.  Extremities: No LE edema. Dialysis Access: AVF + T/B Hematoma upper portion on AVF but doesn't seem to be compromising flow. Cannulate distal to hematoma.    Additional Objective Labs: Basic Metabolic Panel: Recent Labs  Lab 01/31/21 0108 02/01/21 0305 02/02/21 0153  NA 135 132* 132*  K 3.7 3.4* 3.8  CL 98 93* 91*  CO2 24 24 27   GLUCOSE 117* 129* 125*  BUN 26* 23 39*  CREATININE 2.92* 3.25* 4.55*  CALCIUM 9.4 9.2 9.5  PHOS 5.8* 5.6* 6.4*   Liver Function Tests: Recent Labs  Lab 01/29/21 0840 01/30/21 0553 01/31/21 0108 02/01/21 0305 02/02/21 0153  AST 25  --   --   --   --   ALT 15  --   --   --   --   ALKPHOS 113  --   --   --   --   BILITOT 0.9  --   --   --   --   PROT 5.7*  --   --   --   --   ALBUMIN 3.1*   < > 2.9* 2.6* 2.5*   < > = values in this interval not displayed.   Recent Labs  Lab 01/29/21 0840  LIPASE 27   CBC: Recent Labs  Lab 01/29/21 0840 01/30/21 0553 01/31/21 0800 02/01/21 0305 02/02/21 0153  WBC 15.6* 12.6* 12.2* 12.3* 13.0*  NEUTROABS 12.5*  --   --   --   --   HGB 11.1* 11.1* 9.7* 10.1* 10.0*  HCT 34.0* 33.6* 30.1* 31.0* 29.9*  MCV 100.0 98.8 101.0* 99.4 97.4  PLT 276 272 211 186 230   Blood Culture    Component Value Date/Time   SDES  04/21/2020 0328    BLOOD RIGHT HAND Performed at [facility], [place] [address],  [place], [place] [address]    SPECREQUEST  04/21/2020 0328    BOTTLES DRAWN AEROBIC ONLY Blood Culture adequate volume Performed at [place] [address] [place], [place] [address]    CULT  04/21/2020 0328    NO GROWTH 5 DAYS Performed at [facility], [place] [address] [place], [place] [address]    REPTSTATUS 04/26/2020 FINAL 04/21/2020 0328    Cardiac Enzymes: No results for input(s): CKTOTAL, CKMB, CKMBINDEX, TROPONINI in the last 168 hours. CBG: Recent Labs  Lab 01/31/21 1157  GLUCAP 112*   Iron Studies: No results for input(s): IRON, TIBC, TRANSFERRIN, FERRITIN in the last 72 hours. @lablastinr3 @ Studies/Results: DG CHEST PORT 1 VIEW  Result Date: 02/01/2021 CLINICAL DATA:  Follow-up exam. Patient has a history of heart murmur and hypertension. EXAM: PORTABLE CHEST 1 VIEW COMPARISON:  January 29, 2021 FINDINGS: The heart size and mediastinal contours are stable. The heart size enlarged. Central pulmonary  vascular congestion is identified slightly improved compared prior exam. The visualized skeletal structures are stable. IMPRESSION: Congestive heart failure slightly improved compared prior exam. Electronically Signed   By: Abelardo Diesel M.D.   On: 02/01/2021 09:25   DG Abd Portable 1V  Result Date: 01/31/2021 CLINICAL DATA:  Abdominal distension EXAM: PORTABLE ABDOMEN - 1 VIEW COMPARISON:  Portable exam 1230 hours compared to CT abdomen and pelvis 04/20/2020 FINDINGS: Air-filled loops of large and small bowel throughout abdomen, with small-bowel loops upper normal caliber. No definite evidence of obstruction or wall thickening. Bones demineralized with degenerative changes of thoracolumbar spine and note of a LEFT hip prosthesis. IMPRESSION: Nonspecific bowel gas pattern with air-filled nondistended loops of bowel throughout abdomen. Electronically Signed   By: Lavonia Dana M.D.   On: 01/31/2021 14:22   ECHOCARDIOGRAM COMPLETE  Result Date: 02/01/2021     ECHOCARDIOGRAM REPORT   Patient Name:   Jeffrey Payne Date of Exam: 02/01/2021 Medical Rec #:  [id number]      Height:       67.0 in Accession #:    [id number]     Weight:       110.2 lb Date of Birth:  04-Nov-1932      BSA:          1.570 m Patient Age:    87 years       BP:           122/55 mmHg Patient Gender: M              HR:           85 bpm. Exam Location:  Inpatient Procedure: 2D Echo, Cardiac Doppler, Color Doppler and 3D Echo Indications:    Atrial Fibrillation I48.91  History:        Patient has prior history of Echocardiogram examinations, most                 recent 04/21/2020. Aortic Valve Disease; Arrythmias:LBBB. End                 stage renal disease. Acute on chronic diastolic CHF. Multifocal                 atrial tachycardia. Anemia of chronic kidney failure. Closed                 fracture of neck of left femur.  Sonographer:    Darlina Sicilian RDCS Referring Phys: [id number] [place]  1. Left ventricular ejection fraction, by estimation, is 35 to 40%. Left ventricular ejection fraction by 3D volume is 40 %. The left ventricle has moderately decreased function. The left ventricle demonstrates global hypokinesis. Left ventricular diastolic parameters are consistent with Grade I diastolic dysfunction (impaired relaxation). Elevated left atrial pressure.  2. Right ventricular systolic function is normal. The right ventricular size is normal. Tricuspid regurgitation signal is inadequate for assessing PA pressure.  3. Left atrial size was moderately dilated.  4. The mitral valve is normal in structure. Trivial mitral valve regurgitation.  5. The aortic valve is tricuspid. There is mild calcification of the aortic valve. There is moderate thickening of the aortic valve. Aortic valve regurgitation is moderate. Mild to moderate aortic valve sclerosis/calcification is present, without any evidence of aortic stenosis.  6. The inferior vena cava is normal in size with greater than 50%  respiratory variability, suggesting right atrial pressure of 3 mmHg. Comparison(s): Prior images reviewed side by side. The left ventricular function is worsened.  FINDINGS  Left Ventricle: Left ventricular ejection fraction, by estimation, is 35 to 40%. Left ventricular ejection fraction by 3D volume is 40 %. The left ventricle has moderately decreased function. The left ventricle demonstrates global hypokinesis. The left ventricular internal cavity size was normal in size. There is no left ventricular hypertrophy. Abnormal (paradoxical) septal motion, consistent with left bundle branch block. Left ventricular diastolic parameters are consistent with Grade I diastolic dysfunction (impaired relaxation). Elevated left atrial pressure. Right Ventricle: The right ventricular size is normal. No increase in right ventricular wall thickness. Right ventricular systolic function is normal. Tricuspid regurgitation signal is inadequate for assessing PA pressure. Left Atrium: Left atrial size was moderately dilated. Right Atrium: Right atrial size was normal in size. Pericardium: There is no evidence of pericardial effusion. Mitral Valve: The mitral valve is normal in structure. Mild mitral annular calcification. Trivial mitral valve regurgitation. Tricuspid Valve: The tricuspid valve is normal in structure. Tricuspid valve regurgitation is not demonstrated. Aortic Valve: The aortic valve is tricuspid. There is mild calcification of the aortic valve. There is moderate thickening of the aortic valve. Aortic valve regurgitation is moderate. Mild to moderate aortic valve sclerosis/calcification is present, without any evidence of aortic stenosis. Pulmonic Valve: The pulmonic valve was not well visualized. Pulmonic valve regurgitation is mild to moderate. Aorta: The aortic root and ascending aorta are structurally normal, with no evidence of dilitation. Venous: The inferior vena cava is normal in size with greater than 50%  respiratory variability, suggesting right atrial pressure of 3 mmHg. IAS/Shunts: No atrial level shunt detected by color flow Doppler.  LEFT VENTRICLE PLAX 2D LVIDd:         5.50 cm         Diastology LVIDs:         4.40 cm         LV e' medial:    3.30 cm/s LV PW:         0.90 cm         LV E/e' medial:  16.2 LV IVS:        1.10 cm         LV e' lateral:   2.93 cm/s LVOT diam:     2.00 cm         LV E/e' lateral: 18.3 LV SV:         60 LV SV Index:   38 LVOT Area:     3.14 cm        3D Volume EF                                LV 3D EF:    Left                                             ventricul LV Volumes (MOD)                            ar LV vol s, MOD    48.5 ml                    ejection A2C:  fraction                                             by 3D                                             volume is                                             40 %.                                 3D Volume EF:                                3D EF:        40 %                                LV EDV:       189 ml                                LV ESV:       113 ml                                LV SV:        76 ml RIGHT VENTRICLE RV S prime:     10.30 cm/s TAPSE (M-mode): 1.4 cm LEFT ATRIUM           Index        RIGHT ATRIUM           Index LA diam:      4.80 cm 3.06 cm/m   RA Area:     10.60 cm LA Vol (A4C): 36.4 ml 23.15 ml/m  RA Volume:   17.70 ml  11.27 ml/m  AORTIC VALVE LVOT Vmax:   105.00 cm/s LVOT Vmean:  63.500 cm/s LVOT VTI:    0.192 m  AORTA Ao Root diam: 3.60 cm Ao Asc diam:  3.60 cm MITRAL VALVE MV Area (PHT): 2.56 cm    SHUNTS MV Decel Time: 296 msec    Systemic VTI:  0.19 m MV E velocity: 53.50 cm/s  Systemic Diam: 2.00 cm MV A velocity: 96.40 cm/s MV E/A ratio:  0.55 Mihai Croitoru MD Electronically signed by Sanda Klein MD Signature Date/Time: 02/01/2021/2:21:26 PM    Final    Medications:   aspirin EC  81 mg Oral Daily   Chlorhexidine Gluconate Cloth  6  each Topical Q0600   feeding supplement (NEPRO CARB STEADY)  237 mL Oral TID BM   heparin  5,000 Units Subcutaneous Q8H   loratadine  10 mg Oral Daily   metoprolol succinate  50 mg Oral QPM   mupirocin ointment   Topical BID   polyethylene glycol  17 g Oral Daily  senna-docusate  1 tablet Oral BID   simethicone  80 mg Oral QID   tamsulosin  0.4 mg Oral BID     Dialysis Orders: TTS at [facility] 4hr, 400/700, EDW 54.5kg, 2K/2Ca, R AVF,  - heparin 4000 units IV TIW - No ESA, VDRA (fairly recent start)   Assessment/Plan: 1. L hip fracture s/p fall: s/pL hemiarthroplasty 01/30/2021 by ortho. 2. ESRD: Continue HD on TTS schedule. Next HD 02/03/2021. 3. HTN/volume: Mild hypoxia/pulm edem on admit-improved. Vol status acceptable today, UF as tolerated w/ HD 4. Anemia: Hgb 11.1 on admit. Down to 9.7 10/08. Given Aranesp 25 mcg IV 01/31/21 Follow HGB. Transfuse if needed. hgb 10 today 5. Secondary hyperparathyroidism: CorrCa high, Not binders or VDRA. Use 2.0 Ca bath. phos restricted diet 6. Nutrition: Alb low,push protein supplements 7. Hx urinary retention: has foley catheter. Per primary 8. MAT: seen on admission, not in Afib.  SR with PACS at present. per primary. On BB.  9. Leukocytosis: Unclear, per primary. afebrile 10. Scalp laceration s/p fall  Gean Quint, MD [facility]

## 2021-02-02 NOTE — Consult Note (Addendum)
Cardiology Consultation:   Patient ID: Jeffrey Payne MRN: [id number]; DOB: Dec 04, 1932  Admit date: 01/29/2021 Date of Consult: 02/02/2021  PCP:  Kristen Loader, [place] [facility] Cardiologist:  Minus Breeding, MD   Patient Profile:   Jeffrey Payne is a 87 y.o. male with a hx of mild diastolic dysfunction, ESRD on HD, GERD, HTN, and HLD who is being seen 02/02/2021 for the evaluation of new systolic heart failure at the request of Dr. Posey Pronto.  History of Present Illness:   Jeffrey Payne does not currently follow with cardiology. He denies history of ischemic heart disease, PCI, and stroke. He was seen by [facility] 01/2020 for AV fistula placement for progressive CKD. He has since progressed to HD on TTS. First AV fistula failed and he had a second fistula placed in April 2022. He presented to [facility]/29/21 after falling and sustaining a hip fracture that was repaired on 01/30/21. CXR showed mild volume overload. EKG with new LBBB with ?Afib. Echo was obtained after surgery and showed LVEF estimated at 35-40%, EF by 3D volume is 40%, grade 1 DD, normal RV function, moderately dilated left atrial size, and moderate aortic valve regurgitation. This is a reduction in LVEF compared to echo 03/2020 in Dec 2021. Given this new finding, cardiology was consulted.   During my exam, he is sleeping. Much of the following information is gathered from his wife at bedside. He started HD in early August. At first, he seemed to have more energy, but has slowly been fatigued. He walks with a walker at baseline. He has been worried about stool incontinence at night. He woke up around 1AM and took off depends, standing at the foot of the bed and lost his balance. His wife helped him back to bed. Due to ongoing pain, they presented to the ER. She states that over the last couple of months, he has on occasion complained of shortness of breath when lying flat, relieved when he rolls onto his side. He has had some  occasional dizziness unrelated to changing position or scheduled HD days.     He does not smoke cigarettes. He does drink 2 drinks of alcohol daily and has done so for at least 10 years.    Past Medical History:  Diagnosis Date   Anemia    Arthritis    knees   Cancer (HCC)    prostate   CKD (chronic kidney disease) stage 3, GFR 30-59 ml/min (HCC)    Dyspnea    increasing due  fluid   ED (erectile dysfunction)    GERD (gastroesophageal reflux disease)    ocassional    Hard of hearing    Heart murmur    Hypertension    Hypertriglyceridemia    Rosacea     Past Surgical History:  Procedure Laterality Date   A/V FISTULAGRAM N/A 07/25/2020   Procedure: A/V FISTULAGRAM - Left Arm;  Surgeon: Elam Dutch, MD;  Location: [facility];  Service: Cardiovascular;  Laterality: N/A;   AV FISTULA PLACEMENT Left 03/04/2020   Procedure: BRACHIOCEPHALIC ARTERIOVENOUS (AV) FISTULA CREATION LEFT;  Surgeon: Elam Dutch, MD;  Location: [facility];  Service: Vascular;  Laterality: Left;   AV FISTULA PLACEMENT Right 07/28/2020   Procedure: RIGHT  ARM ARTERIOVENOUS FISTULA CREATION;  Surgeon: Elam Dutch, MD;  Location: [facility];  Service: Vascular;  Laterality: Right;   COLONOSCOPY     renal cyst biopsy     TONSILLECTOMY  Home Medications:  Prior to Admission medications   Medication Sig Start Date End Date Taking? Authorizing Provider  acetaminophen (TYLENOL) 650 MG CR tablet Take 650 mg by mouth every 8 (eight) hours as needed for pain.   Yes [provider]  cetirizine (ZYRTEC) 10 MG tablet Take 10 mg by mouth daily.   Yes [provider]  Homeopathic Products (ARNICARE ARNICA) CREA Apply 1 application topically daily as needed (arthritis pain).   Yes [provider]  metoprolol succinate (TOPROL-XL) 50 MG 24 hr tablet Take 50 mg by mouth every evening. 05/27/14  Yes [provider]  tamsulosin (FLOMAX) 0.4 MG CAPS capsule Take 1 capsule (0.4  mg total) by mouth daily after supper. Patient taking differently: Take 0.4 mg by mouth in the morning and at bedtime. 10/09/19  Yes Patrecia Pour, MD  COVID-19 mRNA vaccine, Pfizer, 30 MCG/0.3ML injection INJECT AS DIRECTED 02/05/20 02/04/21  Carlyle Basques, MD  furosemide (LASIX) 40 MG tablet Take 1 tablet (40 mg total) by mouth daily. Patient not taking: Reported on 01/29/2021 04/23/20   Little Ishikawa, MD  furosemide (LASIX) 40 MG tablet Take 1 tablet (40 mg total) by mouth daily as needed for fluid or edema (take after dinner nightly prn for edema or excess fluid). Patient not taking: Reported on 01/29/2021 04/22/20   Little Ishikawa, MD    Inpatient Medications: Scheduled Meds:  aspirin EC  81 mg Oral Daily   Chlorhexidine Gluconate Cloth  6 each Topical Q0600   feeding supplement (NEPRO CARB STEADY)  237 mL Oral TID BM   heparin  5,000 Units Subcutaneous Q8H   metoprolol succinate  50 mg Oral QPM   mupirocin ointment   Topical BID   tamsulosin  0.4 mg Oral BID   Continuous Infusions:  PRN Meds: fentaNYL (SUBLIMAZE) injection, oxyCODONE  Allergies:   No Known Allergies  Social History:   Social History   Socioeconomic History   Marital status: Married    Spouse name: Not on file   Number of children: Not on file   Years of education: Not on file   Highest education level: Not on file  Occupational History   Not on file  Tobacco Use   Smoking status: Never   Smokeless tobacco: Never  Vaping Use   Vaping Use: Never used  Substance and Sexual Activity   Alcohol use: Yes    Alcohol/week: 14.0 standard drinks    Types: 14 Standard drinks or equivalent per week    Comment: 2 cocktails per day   Drug use: No   Sexual activity: Not on file  Other Topics Concern   Not on file  Social History Narrative   Not on file   Social Determinants of Health   Financial Resource Strain: Not on file  Food Insecurity: Not on file  Transportation Needs: Not on file   Physical Activity: Not on file  Stress: Not on file  Social Connections: Not on file  Intimate Partner Violence: Not on file    Family History:    Family History  Problem Relation Age of Onset   Cancer Mother      ROS:  Please see the history of present illness.   All other ROS reviewed and negative.     Physical Exam/Data:   Vitals:   02/01/21 1431 02/01/21 2027 02/02/21 0843 02/02/21 1218  BP: (!) 112/42 (!) 119/53 (!) 127/48 (!) 148/66  Pulse: 74 84 70 77  Resp: 18  18  18  Temp: 99.1 F (37.3 C) 97.8 F (36.6 C) 98.4 F (36.9 C) 97.6 F (36.4 C)  TempSrc:  Oral Oral Oral  SpO2: 92% 90% 90% 92%  Weight:      Height:        Intake/Output Summary (Last 24 hours) at 02/02/2021 1437 Last data filed at 02/02/2021 0950 Gross per 24 hour  Intake 480 ml  Output 275 ml  Net 205 ml   Last 3 Weights 01/31/2021 01/31/2021 01/30/2021  Weight (lbs) 110 lb 3.7 oz 116 lb 13.5 oz (No Data)  Weight (kg) 50 kg 53 kg (No Data)     Body mass index is 17.26 kg/m.  General:  elderly male in NAD found sleeping sitting up in bed HEENT: normal Neck: no JVD Vascular: No carotid bruits; Distal pulses 2+ bilaterally Cardiac:  normal S1, S2; RRR; no murmur  Lungs:  clear to auscultation bilaterally, no wheezing, rhonchi or rales  Abd: soft, nontender, no hepatomegaly  Ext: no edema Musculoskeletal:  No deformities, BUE and BLE strength normal and equal Skin: warm and dry  Neuro:  CNs 2-12 intact, no focal abnormalities noted Psych:  Normal affect   EKG:  The EKG was personally reviewed and demonstrates:  sinus rhythm with HR 81 with LBBB Telemetry:  Telemetry was personally reviewed and demonstrates:  sinus rhythm with HR 80s, NSVT with longest event at HD at 37 beats  Relevant CV Studies:  Echo 02/01/21: 1. Left ventricular ejection fraction, by estimation, is 35 to 40%. Left  ventricular ejection fraction by 3D volume is 40 %. The left ventricle has  moderately decreased  function. The left ventricle demonstrates global  hypokinesis. Left ventricular  diastolic parameters are consistent with Grade I diastolic dysfunction  (impaired relaxation). Elevated left atrial pressure.   2. Right ventricular systolic function is normal. The right ventricular  size is normal. Tricuspid regurgitation signal is inadequate for assessing  PA pressure.   3. Left atrial size was moderately dilated.   4. The mitral valve is normal in structure. Trivial mitral valve  regurgitation.   5. The aortic valve is tricuspid. There is mild calcification of the  aortic valve. There is moderate thickening of the aortic valve. Aortic  valve regurgitation is moderate. Mild to moderate aortic valve  sclerosis/calcification is present, without any  evidence of aortic stenosis.   6. The inferior vena cava is normal in size with greater than 50%  respiratory variability, suggesting right atrial pressure of 3 mmHg.   Laboratory Data:  High Sensitivity Troponin:  No results for input(s): TROPONINIHS in the last 720 hours.   Chemistry Recent Labs  Lab 01/29/21 0840 01/30/21 0553 01/31/21 0108 02/01/21 0305 02/02/21 0153  NA 135   < > 135 132* 132*  K 4.1   < > 3.7 3.4* 3.8  CL 95*   < > 98 93* 91*  CO2 27   < > 24 24 27   GLUCOSE 119*   < > 117* 129* 125*  BUN 31*   < > 26* 23 39*  CREATININE 3.29*   < > 2.92* 3.25* 4.55*  CALCIUM 9.6   < > 9.4 9.2 9.5  MG 1.9  --   --   --   --   GFRNONAA 18*   < > 20* 18* 12*  ANIONGAP 13   < > 13 15 14    < > = values in this interval not displayed.    Recent Labs  Lab 01/29/21 0840  01/30/21 0553 01/31/21 0108 02/01/21 0305 02/02/21 0153  PROT 5.7*  --   --   --   --   ALBUMIN 3.1*   < > 2.9* 2.6* 2.5*  AST 25  --   --   --   --   ALT 15  --   --   --   --   ALKPHOS 113  --   --   --   --   BILITOT 0.9  --   --   --   --    < > = values in this interval not displayed.   Lipids No results for input(s): CHOL, TRIG, HDL, LABVLDL,  LDLCALC, CHOLHDL in the last 168 hours.  Hematology Recent Labs  Lab 01/31/21 0800 02/01/21 0305 02/02/21 0153  WBC 12.2* 12.3* 13.0*  RBC 2.98* 3.12* 3.07*  HGB 9.7* 10.1* 10.0*  HCT 30.1* 31.0* 29.9*  MCV 101.0* 99.4 97.4  MCH 32.6 32.4 32.6  MCHC 32.2 32.6 33.4  RDW 13.9 13.7 13.6  PLT 211 186 230   Thyroid No results for input(s): TSH, FREET4 in the last 168 hours.  BNPNo results for input(s): BNP, PROBNP in the last 168 hours.  DDimer No results for input(s): DDIMER in the last 168 hours.   Radiology/Studies:  DG CHEST PORT 1 VIEW  Result Date: 02/01/2021 CLINICAL DATA:  Follow-up exam. Patient has a history of heart murmur and hypertension. EXAM: PORTABLE CHEST 1 VIEW COMPARISON:  January 29, 2021 FINDINGS: The heart size and mediastinal contours are stable. The heart size enlarged. Central pulmonary vascular congestion is identified slightly improved compared prior exam. The visualized skeletal structures are stable. IMPRESSION: Congestive heart failure slightly improved compared prior exam. Electronically Signed   By: Abelardo Diesel M.D.   On: 02/01/2021 09:25   DG Abd Portable 1V  Result Date: 01/31/2021 CLINICAL DATA:  Abdominal distension EXAM: PORTABLE ABDOMEN - 1 VIEW COMPARISON:  Portable exam 1230 hours compared to CT abdomen and pelvis 04/20/2020 FINDINGS: Air-filled loops of large and small bowel throughout abdomen, with small-bowel loops upper normal caliber. No definite evidence of obstruction or wall thickening. Bones demineralized with degenerative changes of thoracolumbar spine and note of a LEFT hip prosthesis. IMPRESSION: Nonspecific bowel gas pattern with air-filled nondistended loops of bowel throughout abdomen. Electronically Signed   By: Lavonia Dana M.D.   On: 01/31/2021 14:22   ECHOCARDIOGRAM COMPLETE  Result Date: 02/01/2021    ECHOCARDIOGRAM REPORT   Patient Name:   Jeffrey Payne Date of Exam: 02/01/2021 Medical Rec #:  [id number]      Height:       67.0  in Accession #:    [id number]     Weight:       110.2 lb Date of Birth:  1932/09/08      BSA:          1.570 m Patient Age:    29 years       BP:           122/55 mmHg Patient Gender: M              HR:           85 bpm. Exam Location:  Inpatient Procedure: 2D Echo, Cardiac Doppler, Color Doppler and 3D Echo Indications:    Atrial Fibrillation I48.91  History:        Patient has prior history of Echocardiogram examinations, most  recent 04/21/2020. Aortic Valve Disease; Arrythmias:LBBB. End                 stage renal disease. Acute on chronic diastolic CHF. Multifocal                 atrial tachycardia. Anemia of chronic kidney failure. Closed                 fracture of neck of left femur.  Sonographer:    Darlina Sicilian RDCS Referring Phys: [id number] [place]  1. Left ventricular ejection fraction, by estimation, is 35 to 40%. Left ventricular ejection fraction by 3D volume is 40 %. The left ventricle has moderately decreased function. The left ventricle demonstrates global hypokinesis. Left ventricular diastolic parameters are consistent with Grade I diastolic dysfunction (impaired relaxation). Elevated left atrial pressure.  2. Right ventricular systolic function is normal. The right ventricular size is normal. Tricuspid regurgitation signal is inadequate for assessing PA pressure.  3. Left atrial size was moderately dilated.  4. The mitral valve is normal in structure. Trivial mitral valve regurgitation.  5. The aortic valve is tricuspid. There is mild calcification of the aortic valve. There is moderate thickening of the aortic valve. Aortic valve regurgitation is moderate. Mild to moderate aortic valve sclerosis/calcification is present, without any evidence of aortic stenosis.  6. The inferior vena cava is normal in size with greater than 50% respiratory variability, suggesting right atrial pressure of 3 mmHg. Comparison(s): Prior images reviewed side by side. The left  ventricular function is worsened. FINDINGS  Left Ventricle: Left ventricular ejection fraction, by estimation, is 35 to 40%. Left ventricular ejection fraction by 3D volume is 40 %. The left ventricle has moderately decreased function. The left ventricle demonstrates global hypokinesis. The left ventricular internal cavity size was normal in size. There is no left ventricular hypertrophy. Abnormal (paradoxical) septal motion, consistent with left bundle branch block. Left ventricular diastolic parameters are consistent with Grade I diastolic dysfunction (impaired relaxation). Elevated left atrial pressure. Right Ventricle: The right ventricular size is normal. No increase in right ventricular wall thickness. Right ventricular systolic function is normal. Tricuspid regurgitation signal is inadequate for assessing PA pressure. Left Atrium: Left atrial size was moderately dilated. Right Atrium: Right atrial size was normal in size. Pericardium: There is no evidence of pericardial effusion. Mitral Valve: The mitral valve is normal in structure. Mild mitral annular calcification. Trivial mitral valve regurgitation. Tricuspid Valve: The tricuspid valve is normal in structure. Tricuspid valve regurgitation is not demonstrated. Aortic Valve: The aortic valve is tricuspid. There is mild calcification of the aortic valve. There is moderate thickening of the aortic valve. Aortic valve regurgitation is moderate. Mild to moderate aortic valve sclerosis/calcification is present, without any evidence of aortic stenosis. Pulmonic Valve: The pulmonic valve was not well visualized. Pulmonic valve regurgitation is mild to moderate. Aorta: The aortic root and ascending aorta are structurally normal, with no evidence of dilitation. Venous: The inferior vena cava is normal in size with greater than 50% respiratory variability, suggesting right atrial pressure of 3 mmHg. IAS/Shunts: No atrial level shunt detected by color flow Doppler.   LEFT VENTRICLE PLAX 2D LVIDd:         5.50 cm         Diastology LVIDs:         4.40 cm         LV e' medial:    3.30 cm/s LV PW:  0.90 cm         LV E/e' medial:  16.2 LV IVS:        1.10 cm         LV e' lateral:   2.93 cm/s LVOT diam:     2.00 cm         LV E/e' lateral: 18.3 LV SV:         60 LV SV Index:   38 LVOT Area:     3.14 cm        3D Volume EF                                LV 3D EF:    Left                                             ventricul LV Volumes (MOD)                            ar LV vol s, MOD    48.5 ml                    ejection A2C:                                        fraction                                             by 3D                                             volume is                                             40 %.                                 3D Volume EF:                                3D EF:        40 %                                LV EDV:       189 ml                                LV ESV:       113 ml  LV SV:        76 ml RIGHT VENTRICLE RV S prime:     10.30 cm/s TAPSE (M-mode): 1.4 cm LEFT ATRIUM           Index        RIGHT ATRIUM           Index LA diam:      4.80 cm 3.06 cm/m   RA Area:     10.60 cm LA Vol (A4C): 36.4 ml 23.15 ml/m  RA Volume:   17.70 ml  11.27 ml/m  AORTIC VALVE LVOT Vmax:   105.00 cm/s LVOT Vmean:  63.500 cm/s LVOT VTI:    0.192 m  AORTA Ao Root diam: 3.60 cm Ao Asc diam:  3.60 cm MITRAL VALVE MV Area (PHT): 2.56 cm    SHUNTS MV Decel Time: 296 msec    Systemic VTI:  0.19 m MV E velocity: 53.50 cm/s  Systemic Diam: 2.00 cm MV A velocity: 96.40 cm/s MV E/A ratio:  0.55 Mihai Croitoru MD Electronically signed by Sanda Klein MD Signature Date/Time: 02/01/2021/2:21:26 PM    Final    DG HIP UNILAT WITH PELVIS 1V LEFT  Result Date: 01/30/2021 CLINICAL DATA:  Left hip hemiarthroplasty EXAM: DG HIP (WITH OR WITHOUT PELVIS) 1V*L* COMPARISON:  01/29/2021 FINDINGS: Single frontal view of the left hip  demonstrates interval placement of a left hip hemiarthroplasty in the expected position without signs of acute complication. Postsurgical changes in the soft tissues. IMPRESSION: 1. Unremarkable left hip hemiarthroplasty. Electronically Signed   By: Randa Ngo M.D.   On: 01/30/2021 19:07     Assessment and Plan:   Acute on chronic combined systolic and diastolic heart failure - echo this admission with LVEF estimated at 35-40%, EF by 3D volume is 40%, grade 1 DD, normal RV function - volume status per HD - he denies symptoms of volume overload prior to arrival, with the exception of occasional SOB with lying flat and dizziness since HD started - no syncope - managed at home with lasix and BB, ASA started - GDMT may be limited by BP - difficult to tease apart symptoms related to new onset HD vs possible heart disease - discussed options with family - will attempt to get on GDMT, will eventually need an ischemic evaluation - he appears euvolemic today   Abnormal EKG New LBBB - EKG reviewed - appears irregular with p waves, no convincing Afib - no indication for anticoagulation at this time - given fall, may consider placing 14 day monitor to rule out arrhythmia once recovered from his hip surgery   Hip fracture s/p repair - complicated by bowel distension - awaiting CT Abdomen to rule out obstruction   ESRD on HD - per nephrology    Risk Assessment/Risk Scores:    New York Heart Association (NYHA) Functional Class NYHA Class II      For questions or updates, please contact [facility] Please consult [email or web address] for contact info under    Signed, Ledora Bottcher, PA  02/02/2021 2:37 PM  History and all data above reviewed.  Patient examined.  I agree with the findings as above.  The patient is a retired [employer].  He has not had any prior cardiac history.  He has had longstanding hypertension.  He has progressed to end-stage renal disease apparently  secondary to the hypertension.   He has had LVH on EKGs that I see.  Last year he had an EKG which suggested an  old anteroseptal infarct which was new compared to one was prior.  However, he does not report any past history of coronary disease and his wife does not report chest pain.  Apparently uses a walker but he can get around the grocery store with her.  He has not been describing chest pressure, neck or arm discomfort.  He said no shortness of breath, PND or orthopnea.  He is here status post mechanical fall and is found to have a left bundle branch block.  EKG demonstrates a reduced ejection fraction as above.  The patient exam reveals COR: Regular rate and rhythm,  Lungs: Decreased breath sounds without wheezing or crackles,  Abd: Distended abdomen with decreased bowel sounds, Ext no edema.  All available labs, radiology testing, previous records reviewed. Agree with documented assessment and plan.  Cardiomyopathy: I suspect an ischemic etiology but at this point he is very frail and, although I would typically be suggesting a cardiac cath, I discussed this with patient and his wife and I will defer this until he is recovered from some of his acute events.  Managed medically.  Gwyndolyn Saxon will be handled via dialysis.  He is on a beta-blocker which I will continue. He can continue his aspirin.  I would like to start Entresto prior to discharge.  We will follow-up for med titration.  Jeneen Rinks Leavy Heatherly  3:34 PM  02/02/2021

## 2021-02-03 ENCOUNTER — Inpatient Hospital Stay (HOSPITAL_COMMUNITY): Payer: Medicare Other

## 2021-02-03 DIAGNOSIS — I5043 Acute on chronic combined systolic (congestive) and diastolic (congestive) heart failure: Secondary | ICD-10-CM | POA: Diagnosis not present

## 2021-02-03 DIAGNOSIS — S72002A Fracture of unspecified part of neck of left femur, initial encounter for closed fracture: Secondary | ICD-10-CM | POA: Diagnosis not present

## 2021-02-03 LAB — CBC
HCT: 31.4 % — ABNORMAL LOW (ref 39.0–52.0)
Hemoglobin: 10.4 g/dL — ABNORMAL LOW (ref 13.0–17.0)
MCH: 32.1 pg (ref 26.0–34.0)
MCHC: 33.1 g/dL (ref 30.0–36.0)
MCV: 96.9 fL (ref 80.0–100.0)
Platelets: 265 10*3/uL (ref 150–400)
RBC: 3.24 MIL/uL — ABNORMAL LOW (ref 4.22–5.81)
RDW: 13.3 % (ref 11.5–15.5)
WBC: 13.8 10*3/uL — ABNORMAL HIGH (ref 4.0–10.5)
nRBC: 0 % (ref 0.0–0.2)

## 2021-02-03 LAB — RENAL FUNCTION PANEL
Albumin: 2.5 g/dL — ABNORMAL LOW (ref 3.5–5.0)
Anion gap: 15 (ref 5–15)
BUN: 56 mg/dL — ABNORMAL HIGH (ref 8–23)
CO2: 26 mmol/L (ref 22–32)
Calcium: 9.5 mg/dL (ref 8.9–10.3)
Chloride: 92 mmol/L — ABNORMAL LOW (ref 98–111)
Creatinine, Ser: 6.07 mg/dL — ABNORMAL HIGH (ref 0.61–1.24)
GFR, Estimated: 8 mL/min — ABNORMAL LOW (ref >60)
Glucose, Bld: 105 mg/dL — ABNORMAL HIGH (ref 70–99)
Phosphorus: 6.4 mg/dL — ABNORMAL HIGH (ref 2.5–4.6)
Potassium: 3.9 mmol/L (ref 3.5–5.1)
Sodium: 133 mmol/L — ABNORMAL LOW (ref 135–145)

## 2021-02-03 NOTE — Progress Notes (Signed)
Progress Note    Jeffrey Payne   ZOX:[id number]  DOB: 10-Jul-1932  DOA: 01/29/2021     5 Date of Service: 02/03/2021  Subjective:  Reports abdominal pain.  Intermittent nausea.  Pain still present but controlled.  Not passing gas.  Hospital Problems * Closed fracture of neck of left femur (HCC) Mechanical fall at home. Orthopedic consulted. Intermediate surgical risk. Underwent  left hip hemiarthroplasty. Tolerated surgery very well.  Postop weightbearing as tolerated.  Pain management per orthopedic.  Heparin for DVT prophylaxis. PT recommends home with home health  Ileus, unspecified ([place]) Continues to have abdominal distention.  X-ray abdomen the time of admission showed evidence of gaseous distention.  Due to decreasing bowel sound and abdominal pain CT abdomen performed on 10/10.  Confirms a diagnosis of ileus without any obstruction.  Continue n.p.o. for now.  Insert NG. May require surgical consultation if does not improve.  Acute combined systolic and diastolic congestive heart failure (HCC) Echocardiogram shows worsening EF from prior echocardiogram.  Patient is on Toprol-XL and aspirin. [place] cardiology consultation.  Patient is very frail.  Suspected ischemic etiology but currently recommend medical management.  GDMT per cardiology.  Scalp laceration Wound care consulted.  Mupirocin ointment and foam dressing.  Fall at home, initial encounter Mechanical fall.  PT OT consulted.  Currently recommending home with home family prefers to go home versus SNF.  ESRD (end stage renal disease) ([place]) HD TTS.  Appreciate nephrology input.  Volume management Per  Nephrology.  Hypoxia Does not use oxygen at baseline.  Was 88% on room air on admission.  Currently on 2 L of oxygen.  Anticipating improvement with regular HD.  LBBB (left bundle branch block) New onset.  No prior history of LBBB.  Chart reviewed.  Does not have any complaints of angina, chest pain, shortness of  breath or similar symptoms at rest or on exertion prior to admission.  Suspect this is incidental finding.  Echocardiogram currently pending.  No further work-up until echocardiogram is back.  Multifocal atrial tachycardia (HCC) Seen on admission H&P EKG.  Do not see any evidence of A. fib.  Monitor.  Continue beta-blocker.  Echocardiogram currently pending.  Leukocytosis In the setting of fracture and stress.  Monitor.  Anemia of chronic kidney failure H&H stable.  May require transfusion postoperatively.  BPH with urinary obstruction Currently Foley catheter.  Will discontinue likely tomorrow.  Ear stuffiness, right Resolved with Afrin spray.   Objective Vital signs were reviewed and unremarkable.  Vitals:   02/03/21 1500 02/03/21 1530 02/03/21 1550 02/03/21 1644  BP: (!) 114/53 (!) 116/52 (!) 122/57 (!) 127/43  Pulse: 82 82 87 83  Resp:  17 16 17   Temp:   98.6 F (37 C) 97.6 F (36.4 C)  TempSrc:   Oral Oral  SpO2:   91% 91%  Weight:   49.5 kg   Height:       49.5 kg  Exam General: Appear in mild distress, no Rash; Oral Mucosa Clear, moist. no Abnormal Neck Mass Or lumps, Conjunctiva normal  Cardiovascular: S1 and S2 Present, no Murmur, Respiratory: good respiratory effort, Bilateral Air entry present and CTA, no Crackles, no wheezes Abdomen: Bowel Sound sluggish, distended, tight, diffuse tenderness. Extremities: no Pedal edema Neurology: alert and oriented to time, place, and person affect appropriate. no new focal deficit Gait not checked due to patient safety concerns    Labs / Other Information Hyponatremia.  Leukocytosis.  Hemoglobin stable.    Time spent: 35 min  [facility] 02/03/2021, 8:06 PM

## 2021-02-03 NOTE — Progress Notes (Signed)
[facility] Progress Note   Subjective: Seen in room. He does report some discomfort in his abdomen.  Objective Vitals:   02/02/21 1218 02/02/21 2153 02/03/21 0518 02/03/21 0752  BP: (!) 148/66 (!) 131/44 (!) 131/35 (!) 127/46  Pulse: 77 80 80 73  Resp: 18 17 18    Temp: 97.6 F (36.4 C) 99.2 F (37.3 C) 97.6 F (36.4 C) (!) 97.4 F (36.3 C)  TempSrc: Oral Oral Oral Oral  SpO2: 92% 90% 91% 90%  Weight:      Height:       Physical Exam General: NAD Heart: S1,S2 RRR No M/R/G Lungs: CTAB Anteriorly. No WOB.  Abdomen: Soft, distended. Foley patent to gravity drain, clear yellow urine.  Extremities: No LE edema. Dialysis Access: AVF + T/B Hematoma upper portion on AVF but doesn't seem to be compromising flow. Cannulate distal to hematoma.    Additional Objective Labs: Basic Metabolic Panel: Recent Labs  Lab 02/01/21 0305 02/02/21 0153 02/03/21 0246  NA 132* 132* 133*  K 3.4* 3.8 3.9  CL 93* 91* 92*  CO2 24 27 26   GLUCOSE 129* 125* 105*  BUN 23 39* 56*  CREATININE 3.25* 4.55* 6.07*  CALCIUM 9.2 9.5 9.5  PHOS 5.6* 6.4* 6.4*   Liver Function Tests: Recent Labs  Lab 01/29/21 0840 01/30/21 0553 02/01/21 0305 02/02/21 0153 02/03/21 0246  AST 25  --   --   --   --   ALT 15  --   --   --   --   ALKPHOS 113  --   --   --   --   BILITOT 0.9  --   --   --   --   PROT 5.7*  --   --   --   --   ALBUMIN 3.1*   < > 2.6* 2.5* 2.5*   < > = values in this interval not displayed.   Recent Labs  Lab 01/29/21 0840  LIPASE 27   CBC: Recent Labs  Lab 01/29/21 0840 01/30/21 0553 01/31/21 0800 02/01/21 0305 02/02/21 0153 02/03/21 0246  WBC 15.6* 12.6* 12.2* 12.3* 13.0* 13.8*  NEUTROABS 12.5*  --   --   --   --   --   HGB 11.1* 11.1* 9.7* 10.1* 10.0* 10.4*  HCT 34.0* 33.6* 30.1* 31.0* 29.9* 31.4*  MCV 100.0 98.8 101.0* 99.4 97.4 96.9  PLT 276 272 211 186 230 265   Blood Culture    Component Value Date/Time   SDES  04/21/2020 0328    BLOOD  RIGHT HAND Performed at [facility], [place] [address], [place], [place] [address]    SPECREQUEST  04/21/2020 0328    BOTTLES DRAWN AEROBIC ONLY Blood Culture adequate volume Performed at [facility] [address], [place], [place] [address]    CULT  04/21/2020 0328    NO GROWTH 5 DAYS Performed at [facility], [place] [address] [place], [place] [address]    REPTSTATUS 04/26/2020 FINAL 04/21/2020 0328    Cardiac Enzymes: No results for input(s): CKTOTAL, CKMB, CKMBINDEX, TROPONINI in the last 168 hours. CBG: Recent Labs  Lab 01/31/21 1157  GLUCAP 112*   Iron Studies: No results for input(s): IRON, TIBC, TRANSFERRIN, FERRITIN in the last 72 hours. @lablastinr3 @ Studies/Results: CT ABDOMEN PELVIS WO CONTRAST  Result Date: 02/02/2021 CLINICAL DATA:  Abdominal pain, distension EXAM: CT ABDOMEN AND PELVIS WITHOUT CONTRAST TECHNIQUE: Multidetector CT imaging of the abdomen and pelvis was performed  following the standard protocol without IV contrast. COMPARISON:  01/31/2021, 04/20/2020 FINDINGS: Lower chest: The heart is enlarged, with trace pericardial effusion. There are small bilateral pleural effusions, with minimal dependent lower lobe atelectasis. Hepatobiliary: No focal liver abnormality is seen. No gallstones, gallbladder wall thickening, or biliary dilatation. Pancreas: Unremarkable. No pancreatic ductal dilatation or surrounding inflammatory changes. Spleen: Normal in size without focal abnormality. Adrenals/Urinary Tract: Interval drainage or rupture of the large cyst off the lower pole left kidney. There is bilateral renal cortical atrophy unchanged. No urinary tract calculi or obstructive uropathy. Evaluation of the bladder is limited by streak artifact from left hip arthroplasty. The adrenals are unremarkable. Stomach/Bowel: There is diffuse gaseous distension of the stomach, small bowel, and colon. No evidence of obstruction. Findings  could reflect ileus. There is scattered diverticulosis throughout the colon without evidence of acute diverticulitis. The appendix, if still present, is not well visualized. Vascular/Lymphatic: Aortic atherosclerosis. No enlarged abdominal or pelvic lymph nodes. Reproductive: Prostate is enlarged, evaluation limited by left hip arthroplasty. Other: No free fluid or free intraperitoneal gas. No abdominal wall hernia. Musculoskeletal: Unremarkable left hip hemiarthroplasty. No acute or destructive bony lesions. Reconstructed images demonstrate no additional findings. IMPRESSION: 1. Diffuse gaseous distention of the bowel consistent with ileus. 2. Small bilateral pleural effusions. 3. Cardiomegaly with trace pericardial effusion. 4. Diffuse colonic diverticulosis without diverticulitis. 5.  Aortic Atherosclerosis (ICD10-I70.0). Electronically Signed   By: Randa Ngo M.D.   On: 02/02/2021 16:13   Medications:   aspirin EC  81 mg Oral Daily   Chlorhexidine Gluconate Cloth  6 each Topical Q0600   feeding supplement (NEPRO CARB STEADY)  237 mL Oral TID BM   heparin  5,000 Units Subcutaneous Q8H   metoprolol succinate  50 mg Oral QPM   mupirocin ointment   Topical BID   tamsulosin  0.4 mg Oral BID     Dialysis Orders: TTS at [facility] 4hr, 400/700, EDW 54.5kg, 2K/2Ca, R AVF,  - heparin 4000 units IV TIW - No ESA, VDRA (fairly recent start)   Assessment/Plan: 1. L hip fracture s/p fall: s/pL hemiarthroplasty 01/30/2021 by ortho. 2. ESRD: Continue HD on TTS schedule. HD today, next treatment planned for 10/13 3. HTN/volume: Mild hypoxia/pulm edem on admit-improved. Vol status acceptable today, UF as tolerated w/ HD 4. Anemia: Hgb 11.1 on admit. Down to 9.7 10/08. Given Aranesp 25 mcg IV 01/31/21 Follow HGB. Transfuse if needed. hgb 10.4 today 5. Secondary hyperparathyroidism: CorrCa high, Not binders or VDRA. Use 2.0 Ca bath. phos restricted diet 6. Nutrition: Alb low,push protein supplements 7. Hx  urinary retention: has foley catheter. Per primary 8. MAT: seen on admission, not in Afib.  SR with PACS at present. per primary. On BB.  9. Leukocytosis: Unclear, per primary. afebrile 10. Scalp laceration s/p fall  Gean Quint, MD [facility]

## 2021-02-03 NOTE — Progress Notes (Signed)
PT Cancellation Note  Patient Details Name: Jeffrey Payne MRN: [id number] DOB: 02/01/1933   Cancelled Treatment:    Reason Eval/Treat Not Completed: Patient at procedure or test/unavailable;Medical issues which prohibited therapy (Per NSG, pt. leaving for HD in 5 min.  Also, new ileus on abd CT and NSG states pt. is now NPO and she is working on figuring out why/what the plan is.  PT to f/u in AM)  Elysse Polidore A. Araiya Tilmon, PT, DPT Acute Rehabilitation Services Office: [place] 02/03/2021, 11:03 AM

## 2021-02-03 NOTE — Progress Notes (Signed)
Progress Note  Patient Name: Jeffrey Payne Date of Encounter: 02/03/2021  Primary Cardiologist:   Minus Breeding, MD   Subjective   He looks very frail today.  Abdomen more distended.  No acute SOB.   Inpatient Medications    Scheduled Meds:  aspirin EC  81 mg Oral Daily   Chlorhexidine Gluconate Cloth  6 each Topical Q0600   feeding supplement (NEPRO CARB STEADY)  237 mL Oral TID BM   heparin  5,000 Units Subcutaneous Q8H   metoprolol succinate  50 mg Oral QPM   mupirocin ointment   Topical BID   tamsulosin  0.4 mg Oral BID   Continuous Infusions:  PRN Meds: fentaNYL (SUBLIMAZE) injection, oxyCODONE   Vital Signs    Vitals:   02/02/21 1218 02/02/21 2153 02/03/21 0518 02/03/21 0752  BP: (!) 148/66 (!) 131/44 (!) 131/35 (!) 127/46  Pulse: 77 80 80 73  Resp: 18 17 18    Temp: 97.6 F (36.4 C) 99.2 F (37.3 C) 97.6 F (36.4 C) (!) 97.4 F (36.3 C)  TempSrc: Oral Oral Oral Oral  SpO2: 92% 90% 91% 90%  Weight:      Height:        Intake/Output Summary (Last 24 hours) at 02/03/2021 0827 Last data filed at 02/02/2021 0950 Gross per 24 hour  Intake 240 ml  Output --  Net 240 ml   Filed Weights   01/29/21 0823 01/31/21 0751 01/31/21 1030  Weight: 65.8 kg 53 kg 50 kg    Telemetry    NSR with PACs - Personally Reviewed  ECG    NA - Personally Reviewed  Physical Exam   GEN: No acute distress.   Neck: No  JVD Cardiac: RRR, no murmurs, rubs, or gallops.  Respiratory: Clear to auscultation bilaterally. GI: Abdomen distended with minimal bowel sounds.  MS: No edema; No deformity. Neuro:  Nonfocal  Psych: Normal affect   Labs    Chemistry Recent Labs  Lab 01/29/21 0840 01/30/21 0553 02/01/21 0305 02/02/21 0153 02/03/21 0246  NA 135   < > 132* 132* 133*  K 4.1   < > 3.4* 3.8 3.9  CL 95*   < > 93* 91* 92*  CO2 27   < > 24 27 26   GLUCOSE 119*   < > 129* 125* 105*  BUN 31*   < > 23 39* 56*  CREATININE 3.29*   < > 3.25* 4.55* 6.07*  CALCIUM  9.6   < > 9.2 9.5 9.5  PROT 5.7*  --   --   --   --   ALBUMIN 3.1*   < > 2.6* 2.5* 2.5*  AST 25  --   --   --   --   ALT 15  --   --   --   --   ALKPHOS 113  --   --   --   --   BILITOT 0.9  --   --   --   --   GFRNONAA 18*   < > 18* 12* 8*  ANIONGAP 13   < > 15 14 15    < > = values in this interval not displayed.     Hematology Recent Labs  Lab 02/01/21 0305 02/02/21 0153 02/03/21 0246  WBC 12.3* 13.0* 13.8*  RBC 3.12* 3.07* 3.24*  HGB 10.1* 10.0* 10.4*  HCT 31.0* 29.9* 31.4*  MCV 99.4 97.4 96.9  MCH 32.4 32.6 32.1  MCHC 32.6 33.4 33.1  RDW 13.7 13.6 13.3  PLT 186 230 265    Cardiac EnzymesNo results for input(s): TROPONINI in the last 168 hours. No results for input(s): TROPIPOC in the last 168 hours.   BNPNo results for input(s): BNP, PROBNP in the last 168 hours.   DDimer No results for input(s): DDIMER in the last 168 hours.   Radiology    CT ABDOMEN PELVIS WO CONTRAST  Result Date: 02/02/2021 CLINICAL DATA:  Abdominal pain, distension EXAM: CT ABDOMEN AND PELVIS WITHOUT CONTRAST TECHNIQUE: Multidetector CT imaging of the abdomen and pelvis was performed following the standard protocol without IV contrast. COMPARISON:  01/31/2021, 04/20/2020 FINDINGS: Lower chest: The heart is enlarged, with trace pericardial effusion. There are small bilateral pleural effusions, with minimal dependent lower lobe atelectasis. Hepatobiliary: No focal liver abnormality is seen. No gallstones, gallbladder wall thickening, or biliary dilatation. Pancreas: Unremarkable. No pancreatic ductal dilatation or surrounding inflammatory changes. Spleen: Normal in size without focal abnormality. Adrenals/Urinary Tract: Interval drainage or rupture of the large cyst off the lower pole left kidney. There is bilateral renal cortical atrophy unchanged. No urinary tract calculi or obstructive uropathy. Evaluation of the bladder is limited by streak artifact from left hip arthroplasty. The adrenals are  unremarkable. Stomach/Bowel: There is diffuse gaseous distension of the stomach, small bowel, and colon. No evidence of obstruction. Findings could reflect ileus. There is scattered diverticulosis throughout the colon without evidence of acute diverticulitis. The appendix, if still present, is not well visualized. Vascular/Lymphatic: Aortic atherosclerosis. No enlarged abdominal or pelvic lymph nodes. Reproductive: Prostate is enlarged, evaluation limited by left hip arthroplasty. Other: No free fluid or free intraperitoneal gas. No abdominal wall hernia. Musculoskeletal: Unremarkable left hip hemiarthroplasty. No acute or destructive bony lesions. Reconstructed images demonstrate no additional findings. IMPRESSION: 1. Diffuse gaseous distention of the bowel consistent with ileus. 2. Small bilateral pleural effusions. 3. Cardiomegaly with trace pericardial effusion. 4. Diffuse colonic diverticulosis without diverticulitis. 5.  Aortic Atherosclerosis (ICD10-I70.0). Electronically Signed   By: Randa Ngo M.D.   On: 02/02/2021 16:13   ECHOCARDIOGRAM COMPLETE  Result Date: 02/01/2021    ECHOCARDIOGRAM REPORT   Patient Name:   Jeffrey Payne Date of Exam: 02/01/2021 Medical Rec #:  [id number]      Height:       67.0 in Accession #:    [id number]     Weight:       110.2 lb Date of Birth:  [birth year]      BSA:          1.570 m Patient Age:    87 years       BP:           122/55 mmHg Patient Gender: M              HR:           85 bpm. Exam Location:  Inpatient Procedure: 2D Echo, Cardiac Doppler, Color Doppler and 3D Echo Indications:    Atrial Fibrillation I48.91  History:        Patient has prior history of Echocardiogram examinations, most                 recent 04/21/2020. Aortic Valve Disease; Arrythmias:LBBB. End                 stage renal disease. Acute on chronic diastolic CHF. Multifocal                 atrial tachycardia. Anemia of chronic kidney failure. Closed  fracture of neck of left  femur.  Sonographer:    Darlina Sicilian RDCS Referring Phys: [id number] [place]  1. Left ventricular ejection fraction, by estimation, is 35 to 40%. Left ventricular ejection fraction by 3D volume is 40 %. The left ventricle has moderately decreased function. The left ventricle demonstrates global hypokinesis. Left ventricular diastolic parameters are consistent with Grade I diastolic dysfunction (impaired relaxation). Elevated left atrial pressure.  2. Right ventricular systolic function is normal. The right ventricular size is normal. Tricuspid regurgitation signal is inadequate for assessing PA pressure.  3. Left atrial size was moderately dilated.  4. The mitral valve is normal in structure. Trivial mitral valve regurgitation.  5. The aortic valve is tricuspid. There is mild calcification of the aortic valve. There is moderate thickening of the aortic valve. Aortic valve regurgitation is moderate. Mild to moderate aortic valve sclerosis/calcification is present, without any evidence of aortic stenosis.  6. The inferior vena cava is normal in size with greater than 50% respiratory variability, suggesting right atrial pressure of 3 mmHg. Comparison(s): Prior images reviewed side by side. The left ventricular function is worsened. FINDINGS  Left Ventricle: Left ventricular ejection fraction, by estimation, is 35 to 40%. Left ventricular ejection fraction by 3D volume is 40 %. The left ventricle has moderately decreased function. The left ventricle demonstrates global hypokinesis. The left ventricular internal cavity size was normal in size. There is no left ventricular hypertrophy. Abnormal (paradoxical) septal motion, consistent with left bundle branch block. Left ventricular diastolic parameters are consistent with Grade I diastolic dysfunction (impaired relaxation). Elevated left atrial pressure. Right Ventricle: The right ventricular size is normal. No increase in right ventricular wall  thickness. Right ventricular systolic function is normal. Tricuspid regurgitation signal is inadequate for assessing PA pressure. Left Atrium: Left atrial size was moderately dilated. Right Atrium: Right atrial size was normal in size. Pericardium: There is no evidence of pericardial effusion. Mitral Valve: The mitral valve is normal in structure. Mild mitral annular calcification. Trivial mitral valve regurgitation. Tricuspid Valve: The tricuspid valve is normal in structure. Tricuspid valve regurgitation is not demonstrated. Aortic Valve: The aortic valve is tricuspid. There is mild calcification of the aortic valve. There is moderate thickening of the aortic valve. Aortic valve regurgitation is moderate. Mild to moderate aortic valve sclerosis/calcification is present, without any evidence of aortic stenosis. Pulmonic Valve: The pulmonic valve was not well visualized. Pulmonic valve regurgitation is mild to moderate. Aorta: The aortic root and ascending aorta are structurally normal, with no evidence of dilitation. Venous: The inferior vena cava is normal in size with greater than 50% respiratory variability, suggesting right atrial pressure of 3 mmHg. IAS/Shunts: No atrial level shunt detected by color flow Doppler.  LEFT VENTRICLE PLAX 2D LVIDd:         5.50 cm         Diastology LVIDs:         4.40 cm         LV e' medial:    3.30 cm/s LV PW:         0.90 cm         LV E/e' medial:  16.2 LV IVS:        1.10 cm         LV e' lateral:   2.93 cm/s LVOT diam:     2.00 cm         LV E/e' lateral: 18.3 LV SV:  60 LV SV Index:   38 LVOT Area:     3.14 cm        3D Volume EF                                LV 3D EF:    Left                                             ventricul LV Volumes (MOD)                            ar LV vol s, MOD    48.5 ml                    ejection A2C:                                        fraction                                             by 3D                                              volume is                                             40 %.                                 3D Volume EF:                                3D EF:        40 %                                LV EDV:       189 ml                                LV ESV:       113 ml                                LV SV:        76 ml RIGHT VENTRICLE RV S prime:     10.30 cm/s TAPSE (M-mode): 1.4 cm LEFT ATRIUM           Index        RIGHT ATRIUM           Index LA diam:  4.80 cm 3.06 cm/m   RA Area:     10.60 cm LA Vol (A4C): 36.4 ml 23.15 ml/m  RA Volume:   17.70 ml  11.27 ml/m  AORTIC VALVE LVOT Vmax:   105.00 cm/s LVOT Vmean:  63.500 cm/s LVOT VTI:    0.192 m  AORTA Ao Root diam: 3.60 cm Ao Asc diam:  3.60 cm MITRAL VALVE MV Area (PHT): 2.56 cm    SHUNTS MV Decel Time: 296 msec    Systemic VTI:  0.19 m MV E velocity: 53.50 cm/s  Systemic Diam: 2.00 cm MV A velocity: 96.40 cm/s MV E/A ratio:  0.55 Mihai Croitoru MD Electronically signed by Sanda Klein MD Signature Date/Time: 02/01/2021/2:21:26 PM    Final     Cardiac Studies   ECHO:  1. Left ventricular ejection fraction, by estimation, is 35 to 40%. Left  ventricular ejection fraction by 3D volume is 40 %. The left ventricle has  moderately decreased function. The left ventricle demonstrates global  hypokinesis. Left ventricular  diastolic parameters are consistent with Grade I diastolic dysfunction  (impaired relaxation). Elevated left atrial pressure.   2. Right ventricular systolic function is normal. The right ventricular  size is normal. Tricuspid regurgitation signal is inadequate for assessing  PA pressure.   3. Left atrial size was moderately dilated.   4. The mitral valve is normal in structure. Trivial mitral valve  regurgitation.   5. The aortic valve is tricuspid. There is mild calcification of the  aortic valve. There is moderate thickening of the aortic valve. Aortic  valve regurgitation is moderate. Mild to moderate aortic valve   sclerosis/calcification is present, without any  evidence of aortic stenosis.   6. The inferior vena cava is normal in size with greater than 50%  respiratory variability, suggesting right atrial pressure of 3 mmHg.   Patient Profile     87 y.o. male with a hx of mild diastolic dysfunction, ESRD on HD, GERD, HTN, and HLD who was seen 02/02/2021 for the evaluation of new systolic heart failure at the request of Dr. Posey Pronto.  Assessment & Plan    Acute on chronic combined systolic and diastolic heart failure Suspect ischemic etiology.  Intake and output is incomplete.  Volume managed per dialysis.  Given his frailty, I will hold off on starting Entresto today.    Abnormal EKG New LBBB Work up deferred as above. Continue medical management.     Hip fracture s/p repair Complicated by debility and now bowel distention with ileus.        ESRD on HD   For questions or updates, please contact [place] Please consult [email or web address] for contact info under Cardiology/STEMI.   Signed, Minus Breeding, MD  02/03/2021, 8:27 AM

## 2021-02-03 NOTE — Progress Notes (Addendum)
Placed 40f NGT to rt nare- met mild resistence- even more resistence to lt.- tolerated as well as can be expected- immediate 6072mremoved of bile colored- liguid- when hooked up to LIS. Pt educated on purpose of tube- instructed need to rest the stomach and remove the back up of fluid on th stomach-showed how much was removed in that short time- also reinforced need to leave tube in place and allow it to do its work.

## 2021-02-03 NOTE — Progress Notes (Signed)
   Subjective:  Pain well controlled today. Has been working with PT. Tolerating diet.  Objective:   VITALS:   Vitals:   02/02/21 1218 02/02/21 2153 02/03/21 0518 02/03/21 0752  BP: (!) 148/66 (!) 131/44 (!) 131/35 (!) 127/46  Pulse: 77 80 80 73  Resp: 18 17 18    Temp: 97.6 F (36.4 C) 99.2 F (37.3 C) 97.6 F (36.4 C) (!) 97.4 F (36.3 C)  TempSrc: Oral Oral Oral Oral  SpO2: 92% 90% 91% 90%  Weight:      Height:        Neurologically intact Neurovascular intact Sensation intact distally Intact pulses distally Dorsiflexion/Plantar flexion intact   Lab Results  Component Value Date   WBC 13.8 (H) 02/03/2021   HGB 10.4 (L) 02/03/2021   HCT 31.4 (L) 02/03/2021   MCV 96.9 02/03/2021   PLT 265 02/03/2021     Assessment/Plan:  4 Days Post-Op   - Expected postop acute blood loss anemia - will monitor for symptoms - Patient to work with PT/OT to optimize mobilization safely - DVT ppx - SCDs, ambulation, Heparin 5000u TID - Postoperative Abx: Ancef x 2 additional doses given - WBAT operative extremity - Pain control - multimodal pain management, ATC acetaminophen in conjunction with as needed narcotic (oxycodone), although this should be minimized with other modalities  - Discharge planning pending CM, appreciate coordination   Jeffrey Payne 02/03/2021, 8:10 AM

## 2021-02-03 NOTE — Progress Notes (Signed)
Hemodialysis- Tolerated treatment well. UF 1.6L with dialysis. BP drop early into treatment requiring reduction of uf goal. Patient tolerated well. Able to cannulate away from hematoma with smaller needles. Ran on 4k bath. Primary RN given report.

## 2021-02-03 NOTE — Hospital Course (Signed)
10/6 admitted to the hospital with a hip fracture 10/7 bipolar hip arthroplasty of left hip 10/8 x-ray abdomen shows evidence of dilated bowel without obstruction or ileus. 10/10 CT scan confirms ileus. 10/11 NG ordered.

## 2021-02-03 NOTE — Progress Notes (Signed)
Attempted NG tube in both Nares and met a lot of resistance. Requesting a smaller size (12) from materials.

## 2021-02-03 NOTE — Progress Notes (Signed)
Hemodialysis- Patient brought to unit. Currently no complaints. Given blankets per request. Abdomen is distended. Hematoma present to AVF near venous cannulation area. Will cannulate distally per notes. Will use 16g needles for safety due to small anatomy of avf. All labs and orders reviewed. 4k bath. No heparin given.

## 2021-02-03 NOTE — TOC Progression Note (Signed)
Transition of Care [facility]) - Initial/Assessment Note    Patient Details  Name: Jeffrey Payne MRN: [id number] Date of Birth: 07-29-1932  Transition of Care [facility]) CM/SW Contact:    Milinda Antis, LCSWA Phone Number: 02/03/2021, 5:18 PM  Clinical Narrative:                 CSW met with the patient's wife at bedside.  The family would like 1.  [facility] or 2. [facility].  CSW will follow up with the facilities to see which still has availability.    Expected Discharge Plan: Skilled Nursing Facility Barriers to Discharge: Insurance Authorization   Patient Goals and CMS Choice   CMS Medicare.gov Compare Post Acute Care list provided to:: Patient Represenative (must comment) Choice offered to / list presented to : Spouse  Expected Discharge Plan and Services Expected Discharge Plan: [place]   Discharge Planning Services: CM Consult Post Acute Care Choice: Durable Medical Equipment, Home Health Living arrangements for the past 2 months: Single Family Home                 DME Arranged: 3-N-1                    Prior Living Arrangements/Services Living arrangements for the past 2 months: Single Family Home Lives with:: Spouse Patient language and need for interpreter reviewed:: Yes Do you feel safe going back to the place where you live?: Yes      Need for Family Participation in Patient Care: Yes (Comment) Care giver support system in place?: Yes (comment)   Criminal Activity/Legal Involvement Pertinent to Current Situation/Hospitalization: No - Comment as needed  Activities of Daily Living Home Assistive Devices/Equipment: Walker (specify type) ADL Screening (condition at time of admission) Patient's cognitive ability adequate to safely complete daily activities?: Yes Is the patient deaf or have difficulty hearing?: Yes Does the patient have difficulty seeing, even when wearing glasses/contacts?: No Does the patient have difficulty concentrating,  remembering, or making decisions?: No Patient able to express need for assistance with ADLs?: Yes Does the patient have difficulty dressing or bathing?: Yes Independently performs ADLs?: No Communication: Independent Dressing (OT): Needs assistance Is this a change from baseline?: Pre-admission baseline Grooming: Needs assistance Is this a change from baseline?: Pre-admission baseline Feeding: Independent Bathing: Needs assistance Is this a change from baseline?: Pre-admission baseline Toileting: Independent In/Out Bed: Independent Walks in Home: Independent Does the patient have difficulty walking or climbing stairs?: Yes Weakness of Legs: Left Weakness of Arms/Hands: None  Permission Sought/Granted                  Emotional Assessment       Orientation: : Oriented to Self, Oriented to Place, Oriented to  Time Alcohol / Substance Use: Not Applicable Psych Involvement: No (comment)  Admission diagnosis:  Femoral neck fracture ([place]) [S72.009A] Fall [W19.XXXA] Closed fracture of left hip, initial encounter ([place]) [S72.002A] Injury of head, initial encounter [S09.90XA] Fall, initial encounter [W19.XXXA] Patient Active Problem List   Diagnosis Date Noted   Ileus, unspecified ([place]) 02/02/2021   Acute combined systolic and diastolic congestive heart failure ([place]) 02/01/2021   Ear stuffiness, right 01/31/2021   Closed fracture of neck of left femur ([place]) 01/30/2021   Scalp laceration 01/30/2021   Hypoxia 01/30/2021   Multifocal atrial tachycardia ([place]) 01/30/2021   LBBB (left bundle branch block) 01/30/2021   ESRD (end stage renal disease) ([place]) 01/30/2021   Fall at home, initial encounter  01/29/2021   BPH with urinary obstruction 01/29/2021   Anemia of chronic kidney failure 01/29/2021   Leukocytosis 01/29/2021   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   Allergic rhinitis due to pollen 09/09/2020   Gastroesophageal reflux disease 09/09/2020    Hyperlipidemia 09/09/2020   Hyperparathyroidism due to renal insufficiency (H[facility]) 09/09/2020   Osteoarthritis of knee 02/26/1116   Systolic murmur 35/67/0141   AKI (acute kidney injury) ([place]) 04/20/2020   Acute kidney injury superimposed on CKD ([place]) 10/06/2019   CKD (chronic kidney disease), stage IV ([place]) 10/05/2019   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[place]) 10/05/2019   Bradycardia 10/05/2019   Hypotension 10/05/2019   Fatigue 10/05/2019   Hypertension    Heart murmur    PCP:  Kristen Loader, FNP Pharmacy:   [id number] - [place], [place] - [address] [place] [address] Phone: [phone] Fax: [phone]  [facility] - [place], [place] - [address], [address] [place] [address] Phone: [phone] Fax: [phone]     Social Determinants of Health (SDOH) Interventions    Readmission Risk Interventions No flowsheet data found.

## 2021-02-04 ENCOUNTER — Inpatient Hospital Stay (HOSPITAL_COMMUNITY): Payer: Medicare Other

## 2021-02-04 DIAGNOSIS — S72002A Fracture of unspecified part of neck of left femur, initial encounter for closed fracture: Secondary | ICD-10-CM | POA: Diagnosis not present

## 2021-02-04 LAB — RENAL FUNCTION PANEL
Albumin: 2.6 g/dL — ABNORMAL LOW (ref 3.5–5.0)
Anion gap: 17 — ABNORMAL HIGH (ref 5–15)
BUN: 36 mg/dL — ABNORMAL HIGH (ref 8–23)
CO2: 24 mmol/L (ref 22–32)
Calcium: 9.1 mg/dL (ref 8.9–10.3)
Chloride: 94 mmol/L — ABNORMAL LOW (ref 98–111)
Creatinine, Ser: 4.32 mg/dL — ABNORMAL HIGH (ref 0.61–1.24)
GFR, Estimated: 13 mL/min — ABNORMAL LOW (ref >60)
Glucose, Bld: 101 mg/dL — ABNORMAL HIGH (ref 70–99)
Phosphorus: 5.2 mg/dL — ABNORMAL HIGH (ref 2.5–4.6)
Potassium: 4.3 mmol/L (ref 3.5–5.1)
Sodium: 135 mmol/L (ref 135–145)

## 2021-02-04 LAB — CBC
HCT: 32.9 % — ABNORMAL LOW (ref 39.0–52.0)
Hemoglobin: 10.8 g/dL — ABNORMAL LOW (ref 13.0–17.0)
MCH: 32.3 pg (ref 26.0–34.0)
MCHC: 32.8 g/dL (ref 30.0–36.0)
MCV: 98.5 fL (ref 80.0–100.0)
Platelets: 250 10*3/uL (ref 150–400)
RBC: 3.34 MIL/uL — ABNORMAL LOW (ref 4.22–5.81)
RDW: 13.5 % (ref 11.5–15.5)
WBC: 15.4 10*3/uL — ABNORMAL HIGH (ref 4.0–10.5)
nRBC: 0 % (ref 0.0–0.2)

## 2021-02-04 NOTE — Progress Notes (Signed)
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 Sammuel Hines 01/30/2021 Can be weightbearing on left leg-Staples removed 2 weeks out Oxycodone 5 every 4 as needed pain management Patient appears to want to go home versus SNF New onset systolic heart failure Left bundle branch block with?  A. Fib EF this admission is 35-40% which is a reduction from prior Volume will be managed by HD although seems euvolemic as per cardiology, further options per cardiology and will eventually need ischemic eval Continue Toprol-XL 50 every afternoon, aspirin 81 mg ESRD TTS Defer to nephrology Leukocytosis NOS Will obtain 2 view chest x-ray and get desat screen today Monitor trends Ileus postoperatively Had NG tube placed but refuses to have it replaced Monitor on clear liquid diet-if recurs or has nausea vomiting will need NG and will need general surgery input Multifocal atrial tachycardia No evidence of A. fib, continue Toprol BPH with urinary obstruction Flomax 0.4 Scalp laceration Continue mupirocin and foam dressing   DVT prophylaxis: Heparin Code Status: Full CODE STATUS Family Communication: Discussed with wife at the bedside Disposition:  Status is: Inpatient  Remains inpatient appropriate because:Hemodynamically unstable, Unsafe d/c plan, and IV treatments appropriate due to intensity of illness or inability to take  PO  Dispo: The patient is from: Home              Anticipated d/c is to:  Unclear at this time patient seems to prefer home              Patient currently is not medically stable to d/c.   Difficult to place patient No       Consultants:  Orthopedics Renal Cardiology  Procedures: Hip repair 10/7  Antimicrobials:     Subjective: Awake coherent no distress little bit sleepy verbalizes well however No pain no cough no chills no fever no sputum  Objective: Vitals:   02/03/21 1550 02/03/21 1644 02/03/21 2119 02/04/21 0721  BP: (!) 122/57 (!) 127/43 (!) 127/43 (!) 131/44  Pulse: 87 83 94 83  Resp: 16 17 16 15   Temp: 98.6 F (37 C) 97.6 F (36.4 C) 97.9 F (36.6 C) 97.7 F (36.5 C)  TempSrc: Oral Oral Oral Oral  SpO2: 91% 91% 90% 95%  Weight: 49.5 kg     Height:        Intake/Output Summary (Last 24 hours) at 02/04/2021 1054 Last data filed at 02/04/2021 0900 Gross per 24 hour  Intake 0 ml  Output 2100 ml  Net -2100 ml   Filed Weights   01/31/21 1030 02/03/21 1208 02/03/21 1550  Weight: 50 kg 51 kg 49.5 kg    Examination:  Pleasant coherent no distress EOMI NCAT no focal deficit CTA B no added sound no rales no rhonchi S1-S2 no murmur no rub no gallop Left lower extremity has postop changes with bandage on top of the left hip Abdomen is soft nontender no rebound ROM is grossly intact  Data Reviewed: personally reviewed   CBC    Component  Value Date/Time   WBC 15.4 (H) 02/04/2021 0133   RBC 3.34 (L) 02/04/2021 0133   HGB 10.8 (L) 02/04/2021 0133   HGB 12.8 (L) 07/28/2016 1037   HCT 32.9 (L) 02/04/2021 0133   HCT 38.9 07/28/2016 1037   PLT 250 02/04/2021 0133   PLT 269 07/28/2016 1037   MCV 98.5 02/04/2021 0133   MCV 92.9 07/28/2016 1037   MCH 32.3 02/04/2021 0133   MCHC 32.8 02/04/2021 0133   RDW 13.5 02/04/2021 0133   RDW 13.7 07/28/2016 1037   LYMPHSABS 1.4 01/29/2021 0840   LYMPHSABS 1.8 07/28/2016 1037   MONOABS 1.7 (H) 01/29/2021 0840    MONOABS 1.2 (H) 07/28/2016 1037   EOSABS 0.0 01/29/2021 0840   EOSABS 0.1 07/28/2016 1037   BASOSABS 0.0 01/29/2021 0840   BASOSABS 0.1 07/28/2016 1037   CMP Latest Ref Rng & Units 02/04/2021 02/03/2021 02/02/2021  Glucose 70 - 99 mg/dL 101(H) 105(H) 125(H)  BUN 8 - 23 mg/dL 36(H) 56(H) 39(H)  Creatinine 0.61 - 1.24 mg/dL 4.32(H) 6.07(H) 4.55(H)  Sodium 135 - 145 mmol/L 135 133(L) 132(L)  Potassium 3.5 - 5.1 mmol/L 4.3 3.9 3.8  Chloride 98 - 111 mmol/L 94(L) 92(L) 91(L)  CO2 22 - 32 mmol/L 24 26 27   Calcium 8.9 - 10.3 mg/dL 9.1 9.5 9.5  Total Protein 6.5 - 8.1 g/dL - - -  Total Bilirubin 0.3 - 1.2 mg/dL - - -  Alkaline Phos 38 - 126 U/L - - -  AST 15 - 41 U/L - - -  ALT 0 - 44 U/L - - -     Radiology Studies: CT ABDOMEN PELVIS WO CONTRAST  Result Date: 02/02/2021 CLINICAL DATA:  Abdominal pain, distension EXAM: CT ABDOMEN AND PELVIS WITHOUT CONTRAST TECHNIQUE: Multidetector CT imaging of the abdomen and pelvis was performed following the standard protocol without IV contrast. COMPARISON:  01/31/2021, 04/20/2020 FINDINGS: Lower chest: The heart is enlarged, with trace pericardial effusion. There are small bilateral pleural effusions, with minimal dependent lower lobe atelectasis. Hepatobiliary: No focal liver abnormality is seen. No gallstones, gallbladder wall thickening, or biliary dilatation. Pancreas: Unremarkable. No pancreatic ductal dilatation or surrounding inflammatory changes. Spleen: Normal in size without focal abnormality. Adrenals/Urinary Tract: Interval drainage or rupture of the large cyst off the lower pole left kidney. There is bilateral renal cortical atrophy unchanged. No urinary tract calculi or obstructive uropathy. Evaluation of the bladder is limited by streak artifact from left hip arthroplasty. The adrenals are unremarkable. Stomach/Bowel: There is diffuse gaseous distension of the stomach, small bowel, and colon. No evidence of obstruction. Findings could reflect  ileus. There is scattered diverticulosis throughout the colon without evidence of acute diverticulitis. The appendix, if still present, is not well visualized. Vascular/Lymphatic: Aortic atherosclerosis. No enlarged abdominal or pelvic lymph nodes. Reproductive: Prostate is enlarged, evaluation limited by left hip arthroplasty. Other: No free fluid or free intraperitoneal gas. No abdominal wall hernia. Musculoskeletal: Unremarkable left hip hemiarthroplasty. No acute or destructive bony lesions. Reconstructed images demonstrate no additional findings. IMPRESSION: 1. Diffuse gaseous distention of the bowel consistent with ileus. 2. Small bilateral pleural effusions. 3. Cardiomegaly with trace pericardial effusion. 4. Diffuse colonic diverticulosis without diverticulitis. 5.  Aortic Atherosclerosis (ICD10-I70.0). Electronically Signed   By: Randa Ngo M.D.   On: 02/02/2021 16:13   DG Abd Portable 1V  Result Date: 02/03/2021 CLINICAL DATA:  Ileus EXAM: PORTABLE ABDOMEN - 1 VIEW COMPARISON:  CT 02/02/2021 FINDINGS: Diffuse gaseous distension of the stomach, small bowel, and colon. No  identifiable transition. No supine evidence of free air. No acute osseous abnormality. Left hip arthroplasty. IMPRESSION: Persistent diffuse gaseous distension of bowel, consistent with adynamic ileus. Electronically Signed   By: Maurine Simmering M.D.   On: 02/03/2021 14:23     Scheduled Meds:  aspirin EC  81 mg Oral Daily   Chlorhexidine Gluconate Cloth  6 each Topical Q0600   feeding supplement (NEPRO CARB STEADY)  237 mL Oral TID BM   heparin  5,000 Units Subcutaneous Q8H   metoprolol succinate  50 mg Oral QPM   mupirocin ointment   Topical BID   tamsulosin  0.4 mg Oral BID   Continuous Infusions:   LOS: 6 days   Time spent: 66  Nita Sells, MD [facility] To contact the attending provider between 7A-7P or the covering provider during after hours 7P-7A, please log into the web site [email or web address] and  access using universal [place] password for that web site. If you do not have the password, please call the hospital operator.  02/04/2021, 10:54 AM

## 2021-02-04 NOTE — Progress Notes (Signed)
Physical Therapy Treatment Patient Details Name: Jeffrey Payne MRN: [id number] DOB: Nov 18, 1932 Today's Date: 02/04/2021   History of Present Illness Pt. is 87 yr old M admitted on 10/6 following fall resulting in L hip pain. Imaging (+) for impacted L fem neck fx. CT head/C-spine (-) for acute injury. Pt. underwent L partial hemiarthroplasy (ant/lat approach) on 10/7. Pt. diagnosed with ileus, placed on NG tube 10/11; pulled out NG tube overnight. PMH: anemia, arthritis, prostate CA, CKD, HOH, heart murmur, HTN, ESRD on HD    PT Comments    Pt. Demos improved foot clearance with amb and is able to amb inc distance this session, however, gait distance limited due to pt. Being fatigued from just finishing amb with OT prior to tx.  Pt. Demos B knee buckling resulting in need for max A for recovery after only 5 ft.  Pt. Has also been NPO since yesterday and may be experiencing fatigue from lack of nutrition.  Pt. Would benefit from continued skilled PT in acute care to address LE weakness, dec balance, and gait deviations but will be moved to 3x/week with updated D/C plan to SNF (per CM conversation that she had with pt/spouse).  Recommendations for follow up therapy are one component of a multi-disciplinary discharge planning process, led by the attending physician.  Recommendations may be updated based on patient status, additional functional criteria and insurance authorization.  Follow Up Recommendations  SNF     Equipment Recommendations  Rolling walker with 5" wheels    Recommendations for Other Services       Precautions / Restrictions Precautions Precautions: Anterior Hip Precaution Comments: Precaution sheet in room Restrictions Weight Bearing Restrictions: Yes LLE Weight Bearing: Weight bearing as tolerated     Mobility  Bed Mobility                 Patient Response: Cooperative  Transfers Overall transfer level: Needs assistance Equipment used: Rolling walker (2  wheeled)   Sit to Stand: Min assist         General transfer comment: Pt. performs sit > stand with min A and use of momentum to assist with power up. Demos fair standing balance with use of AD.  Ambulation/Gait Ambulation/Gait assistance: Min assist Gait Distance (Feet): 5 Feet Assistive device: Rolling walker (2 wheeled) Gait Pattern/deviations: Step-to pattern     General Gait Details: Pt. is reeducated on appropriate gait sequencing and is able to amb ~5 ft away from chair before B knee buckling resulting in need for max A to recover.  PT brings chair closer for pt. to have a seat.  States he feels tired. MD removed O2 prior to PT amb and O2 sats noted to drop from 94% to 88%.  Pt. placed back on 2L.   Stairs             Wheelchair Mobility    Modified Rankin (Stroke Patients Only)       Balance Overall balance assessment: Needs assistance Sitting-balance support: No upper extremity supported;Feet supported Sitting balance-Leahy Scale: Fair     Standing balance support: Bilateral upper extremity supported;During functional activity Standing balance-Leahy Scale: Poor                              Cognition Arousal/Alertness: Lethargic Behavior During Therapy: WFL for tasks assessed/performed Overall Cognitive Status: Within Functional Limits for tasks assessed  General Comments: Sitting up in chair when PT arrives.  Spouse present in room.  Reluctant to work with PT, states he already worked with OT and is tired.  PT encourages pt. to try a little amb.      Exercises Total Joint Exercises Short Arc QuadSinclair Ship;Left;20 reps Heel Slides: Left;AAROM;20 reps Straight Leg Raises: AAROM;Left;20 reps    General Comments        Pertinent Vitals/Pain Pain Assessment: 0-10 Pain Score: 4  Pain Location: L LE Pain Descriptors / Indicators: Aching;Discomfort Pain Intervention(s): Monitored during session     Home Living                      Prior Function            PT Goals (current goals can now be found in the care plan section) Acute Rehab PT Goals Patient Stated Goal: CIR, return home Progress towards PT goals: Progressing toward goals    Frequency    Min 3X/week      PT Plan Current plan remains appropriate;Discharge plan needs to be updated    Co-evaluation              AM-PAC PT "6 Clicks" Mobility   Outcome Measure  Help needed turning from your back to your side while in a flat bed without using bedrails?: A Little Help needed moving from lying on your back to sitting on the side of a flat bed without using bedrails?: A Little Help needed moving to and from a bed to a chair (including a wheelchair)?: A Little Help needed standing up from a chair using your arms (e.g., wheelchair or bedside chair)?: A Little Help needed to walk in hospital room?: A Lot Help needed climbing 3-5 steps with a railing? : A Lot 6 Click Score: 16    End of Session Equipment Utilized During Treatment: Gait belt Activity Tolerance: Patient limited by fatigue Patient left: in chair;with call bell/phone within reach;with chair alarm set Nurse Communication: Mobility status       Time: 8242-3536 PT Time Calculation (min) (ACUTE ONLY): 27 min  Charges:  $Gait Training: 8-22 mins $Therapeutic Exercise: 8-22 mins                     Jani Ploeger A. Leetta Hendriks, PT, DPT Acute Rehabilitation Services Office: [place] 02/04/2021, 12:20 PM

## 2021-02-04 NOTE — TOC CAGE-AID Note (Signed)
Transition of Care [facility]) - CAGE-AID Screening   Patient Details  Name: Jeffrey Payne MRN: [id number] Date of Birth: [birth year]  [facility]) CM/SW Contact:    Peachie Barkalow C Tarpley-Carter, LCSWA Phone Number: 02/04/2021, 10:37 AM   Clinical Narrative: Pt participated in [place].  Pt stated she does not use substance or ETOH.  Pt has a history of ETOH use.  Pt was not offered resources, due to no usage of substance or ETOH.     Viveka Wilmeth Tarpley-Carter, MSW, LCSW-A Pronouns:  She/Her/Hers [facility] Clinical Social Worker Direct Number:  [phone] [email or web address]   CAGE-AID Screening:    Have You Ever Felt You Ought to Cut Down on Your Drinking or Drug Use?: No Have People Annoyed You By [employer] Your Drinking Or Drug Use?: No Have You Felt Bad Or Guilty About Your Drinking Or Drug Use?: No Have You Ever Had a Drink or Used Drugs First Thing In The Morning to Steady Your Nerves or to Get Rid of a Hangover?: No CAGE-AID Score: 0  Substance Abuse Education Offered: No

## 2021-02-04 NOTE — Progress Notes (Signed)
NGT removed by pt- declines having it replaced until he speaks to " a doctor".

## 2021-02-04 NOTE — TOC Progression Note (Signed)
Transition of Care [facility]) - Initial/Assessment Note    Patient Details  Name: Jeffrey Payne MRN: [id number] Date of Birth: [birth year]  Transition of Care [facility]) CM/SW Contact:    Milinda Antis, LCSWA Phone Number: 02/04/2021, 8:49 AM  Clinical Narrative:                 08:49-  CSW contacted [facility].  The facility can accept the patient and transport to dialysis.  The family was notified.    Insurance auth received-  Auth number [id number], approved from 02/04/2021-02/06/2021.  Patient can d/c to facility when medically ready.  Patient Goals and CMS Choice   CMS Medicare.gov Compare Post Acute Care list provided to:: Patient Represenative (must comment) Choice offered to / list presented to : Spouse  Expected Discharge Plan and Services Expected Discharge Plan: [place]   Discharge Planning Services: CM Consult Post Acute Care Choice: Durable Medical Equipment, Home Health Living arrangements for the past 2 months: Single Family Home                 DME Arranged: 3-N-1                    Prior Living Arrangements/Services Living arrangements for the past 2 months: Single Family Home Lives with:: Spouse Patient language and need for interpreter reviewed:: Yes Do you feel safe going back to the place where you live?: Yes      Need for Family Participation in Patient Care: Yes (Comment) Care giver support system in place?: Yes (comment)   Criminal Activity/Legal Involvement Pertinent to Current Situation/Hospitalization: No - Comment as needed  Activities of Daily Living Home Assistive Devices/Equipment: Walker (specify type) ADL Screening (condition at time of admission) Patient's cognitive ability adequate to safely complete daily activities?: Yes Is the patient deaf or have difficulty hearing?: Yes Does the patient have difficulty seeing, even when wearing glasses/contacts?: No Does the patient have difficulty concentrating,  remembering, or making decisions?: No Patient able to express need for assistance with ADLs?: Yes Does the patient have difficulty dressing or bathing?: Yes Independently performs ADLs?: No Communication: Independent Dressing (OT): Needs assistance Is this a change from baseline?: Pre-admission baseline Grooming: Needs assistance Is this a change from baseline?: Pre-admission baseline Feeding: Independent Bathing: Needs assistance Is this a change from baseline?: Pre-admission baseline Toileting: Independent In/Out Bed: Independent Walks in Home: Independent Does the patient have difficulty walking or climbing stairs?: Yes Weakness of Legs: Left Weakness of Arms/Hands: None  Permission Sought/Granted                  Emotional Assessment       Orientation: : Oriented to Self, Oriented to Place, Oriented to  Time Alcohol / Substance Use: Not Applicable Psych Involvement: No (comment)  Admission diagnosis:  Femoral neck fracture ([place]) [S72.009A] Fall [W19.XXXA] Closed fracture of left hip, initial encounter ([place]) [S72.002A] Injury of head, initial encounter [S09.90XA] Fall, initial encounter [W19.XXXA] Patient Active Problem List   Diagnosis Date Noted   Ileus, unspecified ([place]) 02/02/2021   Acute combined systolic and diastolic congestive heart failure ([place]) 02/01/2021   Ear stuffiness, right 01/31/2021   Closed fracture of neck of left femur ([place]) 01/30/2021   Scalp laceration 01/30/2021   Hypoxia 01/30/2021   Multifocal atrial tachycardia ([place]) 01/30/2021   LBBB (left bundle branch block) 01/30/2021   ESRD (end stage renal disease) ([place]) 01/30/2021   Fall at home, initial encounter 01/29/2021  BPH with urinary obstruction 01/29/2021   Anemia of chronic kidney failure 01/29/2021   Leukocytosis 01/29/2021   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   Allergic rhinitis due to pollen 09/09/2020   Gastroesophageal reflux disease 09/09/2020    Hyperlipidemia 09/09/2020   Hyperparathyroidism due to renal insufficiency (HCC) 09/09/2020   Osteoarthritis of knee 99/69/2493   Systolic murmur 24/19/9144   AKI (acute kidney injury) ([place]) 04/20/2020   Acute kidney injury superimposed on CKD ([place]) 10/06/2019   CKD (chronic kidney disease), stage IV ([place]) 10/05/2019   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[place]) 10/05/2019   Bradycardia 10/05/2019   Hypotension 10/05/2019   Fatigue 10/05/2019   Hypertension    Heart murmur    PCP:  Kristen Loader, FNP Pharmacy:   [id number] - [place], [place] - [address] [place] [address] Phone: [phone] Fax: [phone]  [facility] - [place], [place] - [address], [address] [place] [address] Phone: [phone] Fax: [phone]     Social Determinants of Health (SDOH) Interventions    Readmission Risk Interventions No flowsheet data found.

## 2021-02-04 NOTE — Progress Notes (Signed)
Occupational Therapy Treatment Patient Details Name: Jeffrey Payne MRN: [id number] DOB: [birth year] Today's Date: 02/04/2021   History of present illness Pt. is 87 yr old M admitted on 10/6 following fall resulting in L hip pain. Imaging (+) for impacted L fem neck fx. CT head/C-spine (-) for acute injury. Pt. underwent L partial hemiarthroplasy (ant/lat approach) on 10/7.   PMH: anemia, arthritis, prostate CA, CKD, HOH, heart murmur, HTN, ESRD on HD   OT comments  Pt is slowly progressing toward OT goals. Pt with increased mobility and activity tolerance, able to ambulate from recliner to BR with Min A using RW. Pt completed toilet transfer with Min A using RW, grab bar, and BSC over toilet. Pt continues to appropriate for CIR. Will follow acutely.   Recommendations for follow up therapy are one component of a multi-disciplinary discharge planning process, led by the attending physician.  Recommendations may be updated based on patient status, additional functional criteria and insurance authorization.    Follow Up Recommendations  CIR;Supervision/Assistance - 24 hour    Equipment Recommendations  3 in 1 bedside commode;Hospital bed    Recommendations for Other Services      Precautions / Restrictions Precautions Precautions: Anterior Hip Precaution Comments: Precaution sheet in room Restrictions Weight Bearing Restrictions: Yes LLE Weight Bearing: Weight bearing as tolerated       Mobility Bed Mobility                    Transfers                      Balance Overall balance assessment: Needs assistance Sitting-balance support: No upper extremity supported;Feet supported Sitting balance-Leahy Scale: Fair     Standing balance support: During functional activity;Bilateral upper extremity supported Standing balance-Leahy Scale: Poor                             ADL either performed or assessed with clinical judgement   ADL Overall ADL's :  Needs assistance/impaired                         Toilet Transfer: Minimal assistance;Adhering to hip precautions;Cueing for sequencing;Cueing for safety;Ambulation;BSC;Grab bars;RW Armed forces technical officer Details (indicate cue type and reason): Pt ambulated from recliner to bathroom using RW and transfered to toilet with BSC placed over. Toileting- Clothing Manipulation and Hygiene: Moderate assistance Toileting - Clothing Manipulation Details (indicate cue type and reason): Mod A for thoroughness     Functional mobility during ADLs: Minimal assistance;Cueing for safety;Cueing for sequencing;Rolling walker General ADL Comments: Pt requiring extended time for functional mobility during ADLs due to pain and weakness. Requiring cues for safety, hand placement on walker, and sequencing suring transfers.     Vision       Perception     Praxis      Cognition Arousal/Alertness: Awake/alert Behavior During Therapy: WFL for tasks assessed/performed Overall Cognitive Status: Within Functional Limits for tasks assessed                                          Exercises     Shoulder Instructions       General Comments      Pertinent Vitals/ Pain       Pain Assessment: 0-10 Pain Score: 6  Pain Location:  L hip Pain Descriptors / Indicators: Discomfort;Tender;Aching Pain Intervention(s): Limited activity within patient's tolerance;Monitored during session;Repositioned  Home Living                                          Prior Functioning/Environment              Frequency  Min 2X/week        Progress Toward Goals  OT Goals(current goals can now be found in the care plan section)  Progress towards OT goals: Progressing toward goals  Acute Rehab OT Goals Patient Stated Goal: CIR, return home OT Goal Formulation: With patient/family Time For Goal Achievement: 02/15/21 Potential to Achieve Goals: Good ADL Goals Pt Will Perform  Grooming: with min guard assist;standing Pt Will Transfer to Toilet: ambulating;bedside commode;with supervision Pt Will Perform Toileting - Clothing Manipulation and hygiene: with supervision;sit to/from stand Additional ADL Goal #1: Pt will be S in and OOB for basic ADLs  Plan Discharge plan remains appropriate;Frequency remains appropriate    Co-evaluation                 AM-PAC OT "6 Clicks" Daily Activity     Outcome Measure   Help from another person eating meals?: None Help from another person taking care of personal grooming?: A Little Help from another person toileting, which includes using toliet, bedpan, or urinal?: A Little Help from another person bathing (including washing, rinsing, drying)?: A Lot Help from another person to put on and taking off regular upper body clothing?: A Little Help from another person to put on and taking off regular lower body clothing?: A Lot 6 Click Score: 17    End of Session    OT Visit Diagnosis: Unsteadiness on feet (R26.81);Other abnormalities of gait and mobility (R26.89);Muscle weakness (generalized) (M62.81);Pain Pain - Right/Left: Left Pain - part of body: Hip   Activity Tolerance     Patient Left     Nurse Communication          Time: 3570-1779 OT Time Calculation (min): 53 min  Charges: OT General Charges $OT Visit: 1 Visit OT Treatments $Self Care/Home Management : 23-37 mins $Therapeutic Activity: 23-37 mins  Mayson Mcneish C, OT/L  Acute Rehab [place] 02/04/2021, 12:11 PM

## 2021-02-04 NOTE — Progress Notes (Signed)
[facility] Progress Note   Subjective: Seen in room. Self removed ngt overnight. Tolerated hd yesterday, net uf 1.5L. Seems to have another hematoma now, luckily stable with good bruit and thrill.  Objective Vitals:   02/03/21 1550 02/03/21 1644 02/03/21 2119 02/04/21 0721  BP: (!) 122/57 (!) 127/43 (!) 127/43 (!) 131/44  Pulse: 87 83 94 83  Resp: 16 17 16 15   Temp: 98.6 F (37 C) 97.6 F (36.4 C) 97.9 F (36.6 C) 97.7 F (36.5 C)  TempSrc: Oral Oral Oral Oral  SpO2: 91% 91% 90% 95%  Weight: 49.5 kg     Height:       Physical Exam General: NAD Heart: S1,S2 RRR No M/R/G Lungs: CTAB Anteriorly. No WOB.  Abdomen: Soft, distended. Foley patent to gravity drain, clear yellow urine.  Extremities: No LE edema. Dialysis Access: AVF + T/B Hematoma upper portion on AVF but doesn't seem to be compromising flow. Cannulate distal to hematoma.    Additional Objective Labs: Basic Metabolic Panel: Recent Labs  Lab 02/02/21 0153 02/03/21 0246 02/04/21 0133  NA 132* 133* 135  K 3.8 3.9 4.3  CL 91* 92* 94*  CO2 27 26 24   GLUCOSE 125* 105* 101*  BUN 39* 56* 36*  CREATININE 4.55* 6.07* 4.32*  CALCIUM 9.5 9.5 9.1  PHOS 6.4* 6.4* 5.2*   Liver Function Tests: Recent Labs  Lab 01/29/21 0840 01/30/21 0553 02/02/21 0153 02/03/21 0246 02/04/21 0133  AST 25  --   --   --   --   ALT 15  --   --   --   --   ALKPHOS 113  --   --   --   --   BILITOT 0.9  --   --   --   --   PROT 5.7*  --   --   --   --   ALBUMIN 3.1*   < > 2.5* 2.5* 2.6*   < > = values in this interval not displayed.   Recent Labs  Lab 01/29/21 0840  LIPASE 27   CBC: Recent Labs  Lab 01/29/21 0840 01/30/21 0553 01/31/21 0800 02/01/21 0305 02/02/21 0153 02/03/21 0246 02/04/21 0133  WBC 15.6*   < > 12.2* 12.3* 13.0* 13.8* 15.4*  NEUTROABS 12.5*  --   --   --   --   --   --   HGB 11.1*   < > 9.7* 10.1* 10.0* 10.4* 10.8*  HCT 34.0*   < > 30.1* 31.0* 29.9* 31.4* 32.9*  MCV 100.0   < >  101.0* 99.4 97.4 96.9 98.5  PLT 276   < > 211 186 230 265 250   < > = values in this interval not displayed.   Blood Culture    Component Value Date/Time   SDES  04/21/2020 0328    BLOOD RIGHT HAND Performed at [facility], [place] [address] [place], [place] [address]    SPECREQUEST  04/21/2020 0328    BOTTLES DRAWN AEROBIC ONLY Blood Culture adequate volume Performed at [facility] [address] [place], [place] [address]    CULT  04/21/2020 0328    NO GROWTH 5 DAYS Performed at [facility], [place] [address] [place], [place] [address]    REPTSTATUS 04/26/2020 FINAL 04/21/2020 0328    Cardiac Enzymes: No results for input(s): CKTOTAL, CKMB, CKMBINDEX, TROPONINI in the last 168 hours. CBG: Recent Labs  Lab 01/31/21 1157  GLUCAP 112*  Iron Studies: No results for input(s): IRON, TIBC, TRANSFERRIN, FERRITIN in the last 72 hours. @lablastinr3 @ Studies/Results: CT ABDOMEN PELVIS WO CONTRAST  Result Date: 02/02/2021 CLINICAL DATA:  Abdominal pain, distension EXAM: CT ABDOMEN AND PELVIS WITHOUT CONTRAST TECHNIQUE: Multidetector CT imaging of the abdomen and pelvis was performed following the standard protocol without IV contrast. COMPARISON:  01/31/2021, 04/20/2020 FINDINGS: Lower chest: The heart is enlarged, with trace pericardial effusion. There are small bilateral pleural effusions, with minimal dependent lower lobe atelectasis. Hepatobiliary: No focal liver abnormality is seen. No gallstones, gallbladder wall thickening, or biliary dilatation. Pancreas: Unremarkable. No pancreatic ductal dilatation or surrounding inflammatory changes. Spleen: Normal in size without focal abnormality. Adrenals/Urinary Tract: Interval drainage or rupture of the large cyst off the lower pole left kidney. There is bilateral renal cortical atrophy unchanged. No urinary tract calculi or obstructive uropathy. Evaluation of the bladder is limited by streak  artifact from left hip arthroplasty. The adrenals are unremarkable. Stomach/Bowel: There is diffuse gaseous distension of the stomach, small bowel, and colon. No evidence of obstruction. Findings could reflect ileus. There is scattered diverticulosis throughout the colon without evidence of acute diverticulitis. The appendix, if still present, is not well visualized. Vascular/Lymphatic: Aortic atherosclerosis. No enlarged abdominal or pelvic lymph nodes. Reproductive: Prostate is enlarged, evaluation limited by left hip arthroplasty. Other: No free fluid or free intraperitoneal gas. No abdominal wall hernia. Musculoskeletal: Unremarkable left hip hemiarthroplasty. No acute or destructive bony lesions. Reconstructed images demonstrate no additional findings. IMPRESSION: 1. Diffuse gaseous distention of the bowel consistent with ileus. 2. Small bilateral pleural effusions. 3. Cardiomegaly with trace pericardial effusion. 4. Diffuse colonic diverticulosis without diverticulitis. 5.  Aortic Atherosclerosis (ICD10-I70.0). Electronically Signed   By: Randa Ngo M.D.   On: 02/02/2021 16:13   DG Abd Portable 1V  Result Date: 02/03/2021 CLINICAL DATA:  Ileus EXAM: PORTABLE ABDOMEN - 1 VIEW COMPARISON:  CT 02/02/2021 FINDINGS: Diffuse gaseous distension of the stomach, small bowel, and colon. No identifiable transition. No supine evidence of free air. No acute osseous abnormality. Left hip arthroplasty. IMPRESSION: Persistent diffuse gaseous distension of bowel, consistent with adynamic ileus. Electronically Signed   By: Maurine Simmering M.D.   On: 02/03/2021 14:23   Medications:   aspirin EC  81 mg Oral Daily   Chlorhexidine Gluconate Cloth  6 each Topical Q0600   feeding supplement (NEPRO CARB STEADY)  237 mL Oral TID BM   heparin  5,000 Units Subcutaneous Q8H   metoprolol succinate  50 mg Oral QPM   mupirocin ointment   Topical BID   tamsulosin  0.4 mg Oral BID     Dialysis Orders: TTS at [facility] 4hr,  400/700, EDW 54.5kg, 2K/2Ca, R AVF,  - heparin 4000 units IV TIW - No ESA, VDRA (fairly recent start)   Assessment/Plan: 1. L hip fracture s/p fall: s/pL hemiarthroplasty 01/30/2021 by ortho. 2. ESRD: Continue HD on TTS schedule. next treatment planned for 10/13 3. HTN/volume: Mild hypoxia/pulm edem on admit-improved. Vol status acceptable today, UF as tolerated w/ HD 4. Anemia: Hgb 11.1 on admit. Down to 9.7 10/08. Given Aranesp 25 mcg IV 01/31/21 Follow HGB. Transfuse if needed. hgb 10.8 today 5. Secondary hyperparathyroidism: CorrCa high, Not binders or VDRA. Use 2.0 Ca bath. phos restricted diet 6. Nutrition: Alb low,push protein supplements 7. Hx urinary retention: has foley catheter. Per primary 8. Ileus: per primary 9. MAT: seen on admission, not in Afib.  SR with PACS at present. per primary. On BB.  10. Leukocytosis: Unclear,  per primary. afebrile 11. Scalp laceration s/p fall 12. Access: hematoma around avf, seems to have good flow, keep arm elevated, stick distal to avf  Gean Quint, MD [facility]

## 2021-02-05 DIAGNOSIS — S72002A Fracture of unspecified part of neck of left femur, initial encounter for closed fracture: Secondary | ICD-10-CM | POA: Diagnosis not present

## 2021-02-05 DIAGNOSIS — I5043 Acute on chronic combined systolic (congestive) and diastolic (congestive) heart failure: Secondary | ICD-10-CM | POA: Diagnosis not present

## 2021-02-05 LAB — RENAL FUNCTION PANEL
Albumin: 2.4 g/dL — ABNORMAL LOW (ref 3.5–5.0)
Anion gap: 14 (ref 5–15)
BUN: 58 mg/dL — ABNORMAL HIGH (ref 8–23)
CO2: 27 mmol/L (ref 22–32)
Calcium: 9.5 mg/dL (ref 8.9–10.3)
Chloride: 95 mmol/L — ABNORMAL LOW (ref 98–111)
Creatinine, Ser: 5.73 mg/dL — ABNORMAL HIGH (ref 0.61–1.24)
GFR, Estimated: 9 mL/min — ABNORMAL LOW (ref >60)
Glucose, Bld: 104 mg/dL — ABNORMAL HIGH (ref 70–99)
Phosphorus: 5.5 mg/dL — ABNORMAL HIGH (ref 2.5–4.6)
Potassium: 4.3 mmol/L (ref 3.5–5.1)
Sodium: 136 mmol/L (ref 135–145)

## 2021-02-05 LAB — CBC WITH DIFFERENTIAL/PLATELET
Abs Immature Granulocytes: 0.07 10*3/uL (ref 0.00–0.07)
Basophils Absolute: 0 10*3/uL (ref 0.0–0.1)
Basophils Relative: 0 %
Eosinophils Absolute: 0.2 10*3/uL (ref 0.0–0.5)
Eosinophils Relative: 1 %
HCT: 30.8 % — ABNORMAL LOW (ref 39.0–52.0)
Hemoglobin: 10.2 g/dL — ABNORMAL LOW (ref 13.0–17.0)
Immature Granulocytes: 1 %
Lymphocytes Relative: 9 %
Lymphs Abs: 1.3 10*3/uL (ref 0.7–4.0)
MCH: 32.4 pg (ref 26.0–34.0)
MCHC: 33.1 g/dL (ref 30.0–36.0)
MCV: 97.8 fL (ref 80.0–100.0)
Monocytes Absolute: 1.7 10*3/uL — ABNORMAL HIGH (ref 0.1–1.0)
Monocytes Relative: 12 %
Neutro Abs: 11.4 10*3/uL — ABNORMAL HIGH (ref 1.7–7.7)
Neutrophils Relative %: 77 %
Platelets: 272 10*3/uL (ref 150–400)
RBC: 3.15 MIL/uL — ABNORMAL LOW (ref 4.22–5.81)
RDW: 13.5 % (ref 11.5–15.5)
WBC: 14.7 10*3/uL — ABNORMAL HIGH (ref 4.0–10.5)
nRBC: 0 % (ref 0.0–0.2)

## 2021-02-05 LAB — TROPONIN I (HIGH SENSITIVITY)
Troponin I (High Sensitivity): 65 ng/L — ABNORMAL HIGH (ref <18)
Troponin I (High Sensitivity): 72 ng/L — ABNORMAL HIGH (ref <18)

## 2021-02-05 MED ORDER — METOPROLOL TARTRATE 12.5 MG HALF TABLET
12.5000 mg | ORAL_TABLET | Freq: Two times a day (BID) | ORAL | Status: DC
  Administered 2021-02-05 – 2021-02-06 (×3): 12.5 mg via ORAL
  Filled 2021-02-05 (×2): qty 1

## 2021-02-05 MED ORDER — METOPROLOL TARTRATE 5 MG/5ML IV SOLN
2.5000 mg | Freq: Four times a day (QID) | INTRAVENOUS | Status: DC | PRN
  Administered 2021-02-05: 2.5 mg via INTRAVENOUS
  Filled 2021-02-05: qty 5

## 2021-02-05 MED ORDER — DILTIAZEM HCL-DEXTROSE 125-5 MG/125ML-% IV SOLN (PREMIX)
5.0000 mg/h | INTRAVENOUS | Status: DC
Start: 2021-02-05 — End: 2021-02-07
  Filled 2021-02-05: qty 125

## 2021-02-05 MED ORDER — METOPROLOL TARTRATE 12.5 MG HALF TABLET
12.5000 mg | ORAL_TABLET | Freq: Two times a day (BID) | ORAL | Status: DC
  Filled 2021-02-05: qty 1

## 2021-02-05 MED ORDER — METOPROLOL SUCCINATE ER 50 MG PO TB24: 50.0000 mg | ORAL_TABLET | Freq: Every evening | ORAL | Status: DC

## 2021-02-05 MED ORDER — BOOST / RESOURCE BREEZE PO LIQD CUSTOM
1.0000 | Freq: Three times a day (TID) | ORAL | Status: DC
  Administered 2021-02-05 – 2021-02-08 (×8): 1 via ORAL
  Filled 2021-02-05 (×4): qty 1

## 2021-02-05 MED ORDER — RENA-VITE PO TABS
1.0000 | ORAL_TABLET | Freq: Every day | ORAL | Status: DC
  Administered 2021-02-05 – 2021-02-11 (×7): 1 via ORAL
  Filled 2021-02-05 (×8): qty 1

## 2021-02-05 MED ORDER — SEVELAMER CARBONATE 800 MG PO TABS
800.0000 mg | ORAL_TABLET | Freq: Three times a day (TID) | ORAL | Status: DC
  Administered 2021-02-05 – 2021-02-11 (×12): 800 mg via ORAL
  Filled 2021-02-05 (×14): qty 1

## 2021-02-05 NOTE — Plan of Care (Signed)
  Problem: Activity: Goal: Risk for activity intolerance will decrease Outcome: Progressing   Problem: Pain Managment: Goal: General experience of comfort will improve Outcome: Progressing   Problem: Safety: Goal: Ability to remain free from injury will improve Outcome: Progressing   

## 2021-02-05 NOTE — Progress Notes (Signed)
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 Sammuel Hines 01/30/2021 Can be weightbearing on left leg-Staples removed 2 weeks out Oxycodone 5 every 4 as needed pain management Patient appears to want to go home versus SNF New onset systolic heart failure Left bundle branch block with?  A. Fib EF this admission is 35-40% which is a reduction from prior Volume managed by HD currently euvolemic as per cardiology, further options per cardiology and will eventually need ischemic eval Continue Toprol-XL 50 every afternoon, aspirin 81 mg ESRD TTS Defer to nephrology-volume status appears appropriate Leukocytosis NOS 2 view CXR shows only atelectasis Etiology of leukocytosis is unclear at this time Clinically-given no fever will hold work-up at this time-periodic recheck in outpatient setting Ileus postoperatively Had NG tube placed but refuses to have it replaced and pulled it out on 10/14 Diet graduated from clear liquids to solids-if tolerates would be ready for discharge Multifocal atrial tachycardia No evidence of A. fib, continue Toprol BPH with urinary obstruction Flomax 0.4 Scalp laceration Continue mupirocin and foam dressing   DVT prophylaxis: Heparin Code Status: Full CODE STATUS Family Communication: Discussed with wife at the bedside Disposition:  Status is: Inpatient  Remains inpatient appropriate  because:Hemodynamically unstable, Unsafe d/c plan, and IV treatments appropriate due to intensity of illness or inability to take PO  Dispo: The patient is from: Home              Anticipated d/c is to:  Unclear at this time patient seems to prefer home              Patient currently is not medically stable to d/c.   Difficult to place patient No       Consultants:  Orthopedics Renal Cardiology  Procedures: Hip repair 10/7  Antimicrobials:     Subjective:  Seen on HD unit no distress Appears a little tired wearing oxygen again No chest pain Tells me not eating and tells me he prefers going home   Objective: Vitals:   02/05/21 1030 02/05/21 1100 02/05/21 1130 02/05/21 1141  BP: (!) 94/31 (!) 100/44 (!) 100/38 (!) 97/33  Pulse: 61 60 65 99  Resp: 13 17 16 19   Temp:      TempSrc:      SpO2: 98% 97% 95% 94%  Weight:      Height:        Intake/Output Summary (Last 24 hours) at 02/05/2021 1146 Last data filed at 02/04/2021 1300 Gross per 24 hour  Intake 120 ml  Output --  Net 120 ml    Filed Weights   02/03/21 1208 02/03/21 1550 02/05/21 0902  Weight: 51 kg 49.5 kg 49.2 kg    Examination:  Alert awake no distress EOMI NCAT no focal deficit Neck soft supple Chest clear no rales rhonchi Abdomen slightly distended possible diastases recti No lower extremity edema Fistula seems intact with less swelling ROM intact no focal deficits   Data Reviewed: personally reviewed   CBC    Component  Value Date/Time   WBC 14.7 (H) 02/05/2021 0323   RBC 3.15 (L) 02/05/2021 0323   HGB 10.2 (L) 02/05/2021 0323   HGB 12.8 (L) 07/28/2016 1037   HCT 30.8 (L) 02/05/2021 0323   HCT 38.9 07/28/2016 1037   PLT 272 02/05/2021 0323   PLT 269 07/28/2016 1037   MCV 97.8 02/05/2021 0323   MCV 92.9 07/28/2016 1037   MCH 32.4 02/05/2021 0323   MCHC 33.1 02/05/2021 0323   RDW 13.5 02/05/2021 0323   RDW 13.7 07/28/2016 1037   LYMPHSABS 1.3 02/05/2021 0323   LYMPHSABS 1.8  07/28/2016 1037   MONOABS 1.7 (H) 02/05/2021 0323   MONOABS 1.2 (H) 07/28/2016 1037   EOSABS 0.2 02/05/2021 0323   EOSABS 0.1 07/28/2016 1037   BASOSABS 0.0 02/05/2021 0323   BASOSABS 0.1 07/28/2016 1037   CMP Latest Ref Rng & Units 02/05/2021 02/04/2021 02/03/2021  Glucose 70 - 99 mg/dL 104(H) 101(H) 105(H)  BUN 8 - 23 mg/dL 58(H) 36(H) 56(H)  Creatinine 0.61 - 1.24 mg/dL 5.73(H) 4.32(H) 6.07(H)  Sodium 135 - 145 mmol/L 136 135 133(L)  Potassium 3.5 - 5.1 mmol/L 4.3 4.3 3.9  Chloride 98 - 111 mmol/L 95(L) 94(L) 92(L)  CO2 22 - 32 mmol/L 27 24 26   Calcium 8.9 - 10.3 mg/dL 9.5 9.1 9.5  Total Protein 6.5 - 8.1 g/dL - - -  Total Bilirubin 0.3 - 1.2 mg/dL - - -  Alkaline Phos 38 - 126 U/L - - -  AST 15 - 41 U/L - - -  ALT 0 - 44 U/L - - -     Radiology Studies: DG Chest 2 View  Result Date: 02/04/2021 CLINICAL DATA:  Pneumonia EXAM: CHEST - 2 VIEW COMPARISON:  02/01/2021 FINDINGS: Heart size upper normal. Negative for heart failure. Atherosclerotic calcification aortic arch. Improved aeration in the left lung base. Right lung remains clear. No pleural effusion. Calcified granuloma left upper lobe unchanged. IMPRESSION: Improving left lower lobe atelectasis. Electronically Signed   By: Franchot Gallo M.D.   On: 02/04/2021 14:53     Scheduled Meds:  aspirin EC  81 mg Oral Daily   Chlorhexidine Gluconate Cloth  6 each Topical Q0600   feeding supplement  1 Container Oral TID BM   heparin  5,000 Units Subcutaneous Q8H   metoprolol succinate  50 mg Oral QPM   multivitamin  1 tablet Oral QHS   mupirocin ointment   Topical BID   sevelamer carbonate  800 mg Oral TID WC   tamsulosin  0.4 mg Oral BID   Continuous Infusions:   LOS: 7 days   Time spent: 8  Nita Sells, MD [facility] To contact the attending provider between 7A-7P or the covering provider during after hours 7P-7A, please log into the web site [email or web address] and access using universal [place]  password for that web site. If you do not have the password, please call the hospital operator.  02/05/2021, 11:46 AM

## 2021-02-05 NOTE — Progress Notes (Addendum)
Tele called and reported pts been converted and sustained Sinus Rhythm in 80-86. Notified MD and new order noted.

## 2021-02-05 NOTE — Plan of Care (Signed)

## 2021-02-05 NOTE — Progress Notes (Signed)
Tele called and reported pt's in Afib. Notified MD. New order noted. MD saw pt with new order. Metoprolol given STAT. EKG done.

## 2021-02-05 NOTE — Progress Notes (Signed)
Progress Note  Patient Name: Jeffrey Payne Date of Encounter: 02/05/2021  Primary Cardiologist:   Minus Breeding, MD   Subjective   He denies chest pain or SOB.   Currently in HD.    Inpatient Medications    Scheduled Meds:  aspirin EC  81 mg Oral Daily   Chlorhexidine Gluconate Cloth  6 each Topical Q0600   feeding supplement  1 Container Oral TID BM   heparin  5,000 Units Subcutaneous Q8H   metoprolol succinate  50 mg Oral QPM   multivitamin  1 tablet Oral QHS   mupirocin ointment   Topical BID   sevelamer carbonate  800 mg Oral TID WC   tamsulosin  0.4 mg Oral BID   Continuous Infusions:  PRN Meds: oxyCODONE   Vital Signs    Vitals:   02/05/21 1147 02/05/21 1200 02/05/21 1215 02/05/21 1230  BP: (!) 62/30 (!) 93/25 (!) 97/36 (!) 94/37  Pulse: 75 64 70 72  Resp: 20 16 (!) 25 17  Temp:      TempSrc:      SpO2: 95% 95% 96% 98%  Weight:      Height:        Intake/Output Summary (Last 24 hours) at 02/05/2021 1259 Last data filed at 02/04/2021 1300 Gross per 24 hour  Intake 120 ml  Output --  Net 120 ml   Filed Weights   02/03/21 1208 02/03/21 1550 02/05/21 0902  Weight: 51 kg 49.5 kg 49.2 kg    Telemetry    NA - Personally Reviewed  ECG    NA - Personally Reviewed  Physical Exam   GEN: No  acute distress.   Very frail appearing. Neck: No  JVD Cardiac: Regular RR, no murmurs, rubs, or gallops.  Respiratory: Clear  to auscultation bilaterally. GI: Soft, nontender, non-distended, normal bowel sounds  MS:  No edema; No deformity. Neuro:   Nonfocal  Psych: Oriented and appropriate    Labs    Chemistry Recent Labs  Lab 02/03/21 0246 02/04/21 0133 02/05/21 0323  NA 133* 135 136  K 3.9 4.3 4.3  CL 92* 94* 95*  CO2 26 24 27   GLUCOSE 105* 101* 104*  BUN 56* 36* 58*  CREATININE 6.07* 4.32* 5.73*  CALCIUM 9.5 9.1 9.5  ALBUMIN 2.5* 2.6* 2.4*  GFRNONAA 8* 13* 9*  ANIONGAP 15 17* 14     Hematology Recent Labs  Lab 02/03/21 0246  02/04/21 0133 02/05/21 0323  WBC 13.8* 15.4* 14.7*  RBC 3.24* 3.34* 3.15*  HGB 10.4* 10.8* 10.2*  HCT 31.4* 32.9* 30.8*  MCV 96.9 98.5 97.8  MCH 32.1 32.3 32.4  MCHC 33.1 32.8 33.1  RDW 13.3 13.5 13.5  PLT 265 250 272    Cardiac EnzymesNo results for input(s): TROPONINI in the last 168 hours. No results for input(s): TROPIPOC in the last 168 hours.   BNPNo results for input(s): BNP, PROBNP in the last 168 hours.   DDimer No results for input(s): DDIMER in the last 168 hours.   Radiology    DG Chest 2 View  Result Date: 02/04/2021 CLINICAL DATA:  Pneumonia EXAM: CHEST - 2 VIEW COMPARISON:  02/01/2021 FINDINGS: Heart size upper normal. Negative for heart failure. Atherosclerotic calcification aortic arch. Improved aeration in the left lung base. Right lung remains clear. No pleural effusion. Calcified granuloma left upper lobe unchanged. IMPRESSION: Improving left lower lobe atelectasis. Electronically Signed   By: Franchot Gallo M.D.   On: 02/04/2021 14:53    Cardiac  Studies   ECHO:  1. Left ventricular ejection fraction, by estimation, is 35 to 40%. Left  ventricular ejection fraction by 3D volume is 40 %. The left ventricle has  moderately decreased function. The left ventricle demonstrates global  hypokinesis. Left ventricular  diastolic parameters are consistent with Grade I diastolic dysfunction  (impaired relaxation). Elevated left atrial pressure.   2. Right ventricular systolic function is normal. The right ventricular  size is normal. Tricuspid regurgitation signal is inadequate for assessing  PA pressure.   3. Left atrial size was moderately dilated.   4. The mitral valve is normal in structure. Trivial mitral valve  regurgitation.   5. The aortic valve is tricuspid. There is mild calcification of the  aortic valve. There is moderate thickening of the aortic valve. Aortic  valve regurgitation is moderate. Mild to moderate aortic valve  sclerosis/calcification is  present, without any  evidence of aortic stenosis.   6. The inferior vena cava is normal in size with greater than 50%  respiratory variability, suggesting right atrial pressure of 3 mmHg.   Patient Profile     87 y.o. male with a hx of mild diastolic dysfunction, ESRD on HD, GERD, HTN, and HLD who was seen 02/02/2021 for the evaluation of new systolic heart failure at the request of Dr. Posey Pronto.  Assessment & Plan    Acute on chronic combined systolic and diastolic heart failure Suspect ischemic etiology.  Volume managed per dialysis.  Unable to titrate meds.  His BP dropped to 60 with dialysis.  He has been getting the beta blocker in the evening.  If he continues to drop with dialysis I might suggest not taking this the evening before his dialysis.     Abnormal EKG New LBBB Likely ischemic etiology to his LBBB and his cardiomyopathy.  However he is very frail and we are deferring any ischemia work up.  I have had this conversation with the patient and his wife.    Arrhythmia There might be some runs of atrial fib.  I have seen sinus with PACs for the most part.  However, he is not a good DOAC candidate with his frail condition and comorbid issues and the burden of any possible atrial fib is low.  I would not consider DOAC.    Hip fracture s/p repair Complicated by debility and now bowel distention with ileus.        ESRD on HD  We will follow as needed.   For questions or updates, please contact [place] Please consult [email or web address] for contact info under Cardiology/STEMI.   Signed, Minus Breeding, MD  02/05/2021, 12:59 PM

## 2021-02-05 NOTE — Progress Notes (Signed)
Initial Nutrition Assessment  DOCUMENTATION CODES:   Underweight  INTERVENTION:   -D/c Nepro -Boost Breeze po TID, each supplement provides 250 kcal and 9 grams of protein  -Renal MVI daily  NUTRITION DIAGNOSIS:   Increased nutrient needs related to post-op healing as evidenced by estimated needs.  GOAL:   Patient will meet greater than or equal to 90% of their needs  MONITOR:   PO intake, Supplement acceptance, Labs, Weight trends, Skin, I & O's  REASON FOR ASSESSMENT:   Other (Comment)    ASSESSMENT:   Jeffrey Payne is a 87 y.o. male with medical history significant of hypertension, ESRD on HD, state cancer, arthritis, and GERD who presents after having a fall at home with left hip pain.  He was standing at the end of the bed adjusting his underwear when he lost his balance and fell forward.  He denies any loss of consciousness, but thinks that he hit his head on the [place] drawer that is nearby.  Patient noted significant pain in his left hip, but was able to be assisted back into bed with the help of his wife.  Normally patient ambulates with use of a walker.  He is not on any blood thinners.  Prior to going to bed last night patient did complain of having some mild shortness of breath, but it improved when he was lying on his side.    He is due for hemodialysis today.  Denies having any fever, chest pain, palpitations, nausea, vomiting, diarrhea, or significant cough.  Him and his wife believe that he had a tetanus booster within the last 10 years.  Any kind of movement made pain in his left leg worse, and therefore he came into the hospital for further evaluation.  Pt admitted with lt femoral neck fracture and scalp laceration s/p fal.   10/7- s/p left hip hemiarthroplasty 10/11- NGT placed for decompression 10/12- pt removed NGT  Reviewed I/O's: +120 ml x 24 hours and -5.7 L since admission   Pt unavailable at time of visit, currently out of room for procedure. RD  unable to obtain further nutrition-related history or complete nutrition-focused physical exam at this time.    Per nursing notes, pt with new ileus on CT of abdomen. Pt refusing further NGT placement. He is currently on a soft diet. Noted meal completions 50-100%. He has been refusing Nepro supplements.   Per nephrology notes, EDW 54.5 kg. Pt is below EDW. Reviewed wt hx; pt has experienced a 19% wt loss over the past 6 months, which is significant for time frame.   Pt is at high risk for malnutrition, however, unable to identify at this time. Pt would greatly benefit from addition of oral nutrition supplements.   Medications reviewed.   Per TOC notes, plan for SNF placement at discharge.   Labs reviewed: Phos: 5.5. K WDL.    Diet Order:   Diet Order             DIET SOFT Room service appropriate? Yes; Fluid consistency: Thin  Diet effective now                   EDUCATION NEEDS:   No education needs have been identified at this time  Skin:  Skin Assessment: Skin Integrity Issues: Skin Integrity Issues:: Other (Comment), Incisions Incisions: closed lt hip Other: scalp laceration  Last BM:  01/29/21  Height:   Ht Readings from Last 1 Encounters:  01/30/21 5\' 7"  (1.702 m)  Weight:   Wt Readings from Last 1 Encounters:  02/05/21 49.2 kg    Ideal Body Weight:  67.3 kg  BMI:  Body mass index is 16.99 kg/m.  Estimated Nutritional Needs:   Kcal:  1700-1900  Protein:  85-100 grams  Fluid:  1000 ml + UOP    Loistine Chance, RD, LDN, CDCES Registered Dietitian II Certified Diabetes Care and Education Specialist Please refer to [facility] for RD and/or RD on-call/weekend/after hours pager

## 2021-02-05 NOTE — Progress Notes (Signed)
Patient reviewed examined-in no distress but rates 130's No cp, no arm pain Discussed with wife scenario EKG reviewed shows Deep ST T changes which look likely to be rate related  Will transfer to progressive as starting metoprolol both IV and PO has had some effect but rates remain uncontrolled   D/w Cardiology on secure chat  Verneita Griffes, MD [facility] 4:14 PM

## 2021-02-05 NOTE — Progress Notes (Signed)
[facility] Progress Note   Subjective: Seen on HD. No acute events. Hematoma better, no issues with cannulating. Tolerating HD, ufg 2500cc.  Objective Vitals:   02/05/21 0930 02/05/21 1000 02/05/21 1030 02/05/21 1100  BP: (!) 101/41 (!) 102/37 (!) 94/31 (!) 100/44  Pulse:   61 60  Resp: 12 (P) 13 13 17   Temp:      TempSrc:      SpO2: (P) 97% (P) 97% 98% 97%  Weight:      Height:       Physical Exam General: NAD Heart: S1,S2 RRR No M/R/G Lungs: CTAB Anteriorly. No WOB.  Abdomen: Soft, distended. Foley patent to gravity drain, clear yellow urine.  Extremities: No LE edema. Dialysis Access: AVF + T/B Hematoma upper portion on AVF but doesn't seem to be compromising flow (improving). Cannulate distal to hematoma.    Additional Objective Labs: Basic Metabolic Panel: Recent Labs  Lab 02/03/21 0246 02/04/21 0133 02/05/21 0323  NA 133* 135 136  K 3.9 4.3 4.3  CL 92* 94* 95*  CO2 26 24 27   GLUCOSE 105* 101* 104*  BUN 56* 36* 58*  CREATININE 6.07* 4.32* 5.73*  CALCIUM 9.5 9.1 9.5  PHOS 6.4* 5.2* 5.5*   Liver Function Tests: Recent Labs  Lab 02/03/21 0246 02/04/21 0133 02/05/21 0323  ALBUMIN 2.5* 2.6* 2.4*   No results for input(s): LIPASE, AMYLASE in the last 168 hours.  CBC: Recent Labs  Lab 02/01/21 0305 02/02/21 0153 02/03/21 0246 02/04/21 0133 02/05/21 0323  WBC 12.3* 13.0* 13.8* 15.4* 14.7*  NEUTROABS  --   --   --   --  11.4*  HGB 10.1* 10.0* 10.4* 10.8* 10.2*  HCT 31.0* 29.9* 31.4* 32.9* 30.8*  MCV 99.4 97.4 96.9 98.5 97.8  PLT 186 230 265 250 272   Blood Culture    Component Value Date/Time   SDES  04/21/2020 0328    BLOOD RIGHT HAND Performed at [facility], [place] [address] [place], [place] [address]    SPECREQUEST  04/21/2020 0328    BOTTLES DRAWN AEROBIC ONLY Blood Culture adequate volume Performed at [place] [address] [place], [place] [address]    CULT  04/21/2020  0328    NO GROWTH 5 DAYS Performed at [facility], [place] [address] [place], [place] [address]    REPTSTATUS 04/26/2020 FINAL 04/21/2020 0328    Cardiac Enzymes: No results for input(s): CKTOTAL, CKMB, CKMBINDEX, TROPONINI in the last 168 hours. CBG: Recent Labs  Lab 01/31/21 1157  GLUCAP 112*   Iron Studies: No results for input(s): IRON, TIBC, TRANSFERRIN, FERRITIN in the last 72 hours. @lablastinr3 @ Studies/Results: DG Chest 2 View  Result Date: 02/04/2021 CLINICAL DATA:  Pneumonia EXAM: CHEST - 2 VIEW COMPARISON:  02/01/2021 FINDINGS: Heart size upper normal. Negative for heart failure. Atherosclerotic calcification aortic arch. Improved aeration in the left lung base. Right lung remains clear. No pleural effusion. Calcified granuloma left upper lobe unchanged. IMPRESSION: Improving left lower lobe atelectasis. Electronically Signed   By: Franchot Gallo M.D.   On: 02/04/2021 14:53   DG Abd Portable 1V  Result Date: 02/03/2021 CLINICAL DATA:  Ileus EXAM: PORTABLE ABDOMEN - 1 VIEW COMPARISON:  CT 02/02/2021 FINDINGS: Diffuse gaseous distension of the stomach, small bowel, and colon. No identifiable transition. No supine evidence of free air. No acute osseous abnormality. Left hip arthroplasty. IMPRESSION: Persistent diffuse gaseous distension of bowel, consistent with adynamic ileus. Electronically Signed   By: Edison Nasuti  Chancy Milroy M.D.   On: 02/03/2021 14:23   Medications:   aspirin EC  81 mg Oral Daily   Chlorhexidine Gluconate Cloth  6 each Topical Q0600   feeding supplement  1 Container Oral TID BM   heparin  5,000 Units Subcutaneous Q8H   metoprolol succinate  50 mg Oral QPM   multivitamin  1 tablet Oral QHS   mupirocin ointment   Topical BID   tamsulosin  0.4 mg Oral BID     Dialysis Orders: TTS at [facility] 4hr, 400/700, EDW 54.5kg, 2K/2Ca, R AVF,  - heparin 4000 units IV TIW - No ESA, VDRA (fairly recent start)   Assessment/Plan: 1. L hip fracture s/p fall: s/pL  hemiarthroplasty 01/30/2021 by ortho. 2. ESRD: Continue HD on TTS schedule. next treatment planned for 10/15 3. HTN/volume: Mild hypoxia/pulm edem on admit-improved. Vol status acceptable today, UF as tolerated w/ HD 4. Anemia: Hgb 11.1 on admit. Down to 9.7 10/08. Given Aranesp 25 mcg IV 01/31/21 Follow HGB. Transfuse if needed. hgb 10.2 today 5. Secondary hyperparathyroidism: CorrCa high, Not binders or VDRA. Use 2.0 Ca bath. phos restricted diet. Starting renvela 6. Nutrition: Alb low,push protein supplements 7. Hx urinary retention: has foley catheter. Per primary 8. Ileus: per primary 9. MAT: seen on admission, not in Afib.  SR with PACS at present. per primary. On BB.  10. Leukocytosis: Unclear, per primary. afebrile 11. Scalp laceration s/p fall 12. Access: hematoma around avf, seems to have good flow, keep arm elevated, stick distal to avf  Gean Quint, MD [facility]

## 2021-02-06 DIAGNOSIS — S72002A Fracture of unspecified part of neck of left femur, initial encounter for closed fracture: Secondary | ICD-10-CM | POA: Diagnosis not present

## 2021-02-06 DIAGNOSIS — I4891 Unspecified atrial fibrillation: Secondary | ICD-10-CM

## 2021-02-06 LAB — RENAL FUNCTION PANEL
Albumin: 2.5 g/dL — ABNORMAL LOW (ref 3.5–5.0)
Anion gap: 11 (ref 5–15)
BUN: 26 mg/dL — ABNORMAL HIGH (ref 8–23)
CO2: 27 mmol/L (ref 22–32)
Calcium: 9 mg/dL (ref 8.9–10.3)
Chloride: 99 mmol/L (ref 98–111)
Creatinine, Ser: 3.74 mg/dL — ABNORMAL HIGH (ref 0.61–1.24)
GFR, Estimated: 15 mL/min — ABNORMAL LOW (ref >60)
Glucose, Bld: 126 mg/dL — ABNORMAL HIGH (ref 70–99)
Phosphorus: 4.1 mg/dL (ref 2.5–4.6)
Potassium: 3.5 mmol/L (ref 3.5–5.1)
Sodium: 137 mmol/L (ref 135–145)

## 2021-02-06 LAB — MAGNESIUM: Magnesium: 2.1 mg/dL (ref 1.7–2.4)

## 2021-02-06 LAB — GLUCOSE, CAPILLARY: Glucose-Capillary: 132 mg/dL — ABNORMAL HIGH (ref 70–99)

## 2021-02-06 MED ORDER — METOPROLOL TARTRATE 12.5 MG HALF TABLET: 12.5000 mg | ORAL_TABLET | Freq: Once | ORAL | Status: DC

## 2021-02-06 MED ORDER — METOPROLOL TARTRATE 12.5 MG HALF TABLET
12.5000 mg | ORAL_TABLET | Freq: Four times a day (QID) | ORAL | Status: DC
  Administered 2021-02-06 – 2021-02-07 (×5): 12.5 mg via ORAL
  Filled 2021-02-06 (×8): qty 1

## 2021-02-06 NOTE — TOC Progression Note (Signed)
Transition of Care [facility]) - Initial/Assessment Note    Patient Details  Name: Jeffrey Payne MRN: [id number] Date of Birth: [birth year]  Transition of Care [facility]) CM/SW Contact:    Milinda Antis, [place] Phone Number: 02/06/2021, 4:32 PM  Clinical Narrative:                  CSW spoke with Star with admissions at [facility] (SNF).  The facility cannot accept the patient over the weekend, but can accept on Monday. Attending notified.  CSW resubmitted clinicals so that insurance authorization can be extended.  Expected Discharge Plan: Skilled Nursing Facility Barriers to Discharge: Insurance Authorization   Patient Goals and CMS Choice   CMS Medicare.gov Compare Post Acute Care list provided to:: Patient Represenative (must comment) Choice offered to / list presented to : Spouse  Expected Discharge Plan and Services Expected Discharge Plan: [place]   Discharge Planning Services: CM Consult Post Acute Care Choice: Durable Medical Equipment, Home Health Living arrangements for the past 2 months: Single Family Home                 DME Arranged: 3-N-1         HH Arranged: PT, OT HH Agency: [place] Date HH Agency Contacted: 02/04/21 Time HH Agency Contacted: 1404 Representative spoke with at [place]: [place] Arrangements/Services Living arrangements for the past 2 months: [place] with:: Spouse Patient language and need for interpreter reviewed:: Yes Do you feel safe going back to the place where you live?: Yes      Need for Family Participation in Patient Care: Yes (Comment) Care giver support system in place?: Yes (comment)   Criminal Activity/Legal Involvement Pertinent to Current Situation/Hospitalization: No - Comment as needed  Activities of Daily Living Home Assistive Devices/Equipment: Walker (specify type) ADL Screening (condition at time of admission) Patient's cognitive ability adequate to  safely complete daily activities?: Yes Is the patient deaf or have difficulty hearing?: Yes Does the patient have difficulty seeing, even when wearing glasses/contacts?: No Does the patient have difficulty concentrating, remembering, or making decisions?: No Patient able to express need for assistance with ADLs?: Yes Does the patient have difficulty dressing or bathing?: Yes Independently performs ADLs?: No Communication: Independent Dressing (OT): Needs assistance Is this a change from baseline?: Pre-admission baseline Grooming: Needs assistance Is this a change from baseline?: Pre-admission baseline Feeding: Independent Bathing: Needs assistance Is this a change from baseline?: Pre-admission baseline Toileting: Independent In/Out Bed: Independent Walks in Home: Independent Does the patient have difficulty walking or climbing stairs?: Yes Weakness of Legs: Left Weakness of Arms/Hands: None  Permission Sought/Granted                  Emotional Assessment       Orientation: : Oriented to Self, Oriented to Place, Oriented to  Time Alcohol / Substance Use: Not Applicable Psych Involvement: No (comment)  Admission diagnosis:  Femoral neck fracture ([place]) [S72.009A] Fall [W19.XXXA] Closed fracture of left hip, initial encounter ([place]) [S72.002A] Injury of head, initial encounter [S09.90XA] Fall, initial encounter [W19.XXXA] Patient Active Problem List   Diagnosis Date Noted   Ileus, unspecified ([place]) 02/02/2021   Acute combined systolic and diastolic congestive heart failure ([place]) 02/01/2021   Ear stuffiness, right 01/31/2021   Closed fracture of neck of left femur ([place]) 01/30/2021   Scalp laceration 01/30/2021   Hypoxia 01/30/2021   Multifocal atrial tachycardia ([place]) 01/30/2021   LBBB (left bundle  branch block) 01/30/2021   ESRD (end stage renal disease) ([place]) 01/30/2021   Fall at home, initial encounter 01/29/2021   BPH with urinary obstruction 01/29/2021   Anemia  of chronic kidney failure 01/29/2021   Leukocytosis 01/29/2021   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   Allergic rhinitis due to pollen 09/09/2020   Gastroesophageal reflux disease 09/09/2020   Hyperlipidemia 09/09/2020   Hyperparathyroidism due to renal insufficiency ([place]) 09/09/2020   Osteoarthritis of knee 29/12/299   Systolic murmur 49/96/9249   AKI (acute kidney injury) ([place]) 04/20/2020   Acute kidney injury superimposed on CKD ([place]) 10/06/2019   CKD (chronic kidney disease), stage IV ([place]) 10/05/2019   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[place]) 10/05/2019   Bradycardia 10/05/2019   Hypotension 10/05/2019   Fatigue 10/05/2019   Hypertension    Heart murmur    PCP:  Kristen Loader, FNP Pharmacy:   [id number] - [place], [place] - [address] AT [place] [address] [place] [address] Phone: [phone] Fax: [phone]  [facility] - [place], [place] - [address], [address] [place] [address] Phone: [phone] Fax: [phone]     Social Determinants of Health (SDOH) Interventions    Readmission Risk Interventions No flowsheet data found.

## 2021-02-06 NOTE — Progress Notes (Signed)
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 Sammuel Hines 01/30/2021 Can be weightbearing on left leg-Staples removed 2 weeks out Oxycodone 5 every 4 as needed pain management Patient appears to want to go home versus SNF but will go to SNF when all medical issues improved New onset systolic heart failure Left bundle branch block /paroxysmal A. Fib with RVR EF this admission is 35-40% which is a reduction from prior Volume managed by HD currently euvolemic--OP ischemic eval? Flipped into rapid Afib 10/13 --paroxysmal--meds adjusted by Cards to metoprolol 12.5 QID as pressure allows Poor AC candidate  ESRD TTS Defer to nephrology-volume status appears appropriate Leukocytosis NOS 2 view CXR shows only atelectasis Etiology of leukocytosis is unclear at this time Clinically-given no fever will hold work-up at this time-periodic recheck in outpatient setting Ileus postoperatively Pulle dout NG--resolved 10/14 Diet graduated tolerating Advance to full solids today BPH with urinary obstruction Flomax 0.4 Scalp laceration Continue mupirocin and foam dressing   DVT prophylaxis: Heparin Code Status: Full CODE STATUS Family Communication: Discussed with wife at the bedside 10/13 Disposition:  Status is: Inpatient  Remains inpatient appropriate because:Hemodynamically unstable, Unsafe d/c plan, and IV treatments  appropriate due to intensity of illness or inability to take PO  Dispo: The patient is from: Home              Anticipated d/c is to:  Unclear at this time patient seems to prefer home              Patient currently is not medically stable to d/c.   Difficult to place patient No       Consultants:  Orthopedics Renal Cardiology  Procedures: Hip repair 10/7  Antimicrobials:     Subjective:  Fair no new issue Hr seems to fluctuate--flipped into afib this am and then back out No cp   Objective: Vitals:   02/05/21 2009 02/06/21 0954 02/06/21 1017 02/06/21 1329  BP: (!) 121/41 (!) 112/42  (!) 116/46  Pulse: 69 66  79  Resp: 20     Temp: 98.7 F (37.1 C)   97.6 F (36.4 C)  TempSrc: Oral   Oral  SpO2: 98% 95% 94% 99%  Weight:      Height:       No intake or output data in the 24 hours ending 02/06/21 1618  Filed Weights   02/03/21 1208 02/03/21 1550 02/05/21 0902  Weight: 51 kg 49.5 kg 49.2 kg    Examination:  Alert awake no distress EOMI NCAT no focal deficit Neck soft supple Chest clear no rales rhonchi S1 s 2no m--seems to have sinus arrythmia when I examined him earlier Abdomen slightly distended  No lower extremity edema Fistula seems intact with less swelling ROM intact no focal deficits   Data Reviewed: personally reviewed   CBC    Component Value Date/Time   WBC 14.7 (H) 02/05/2021 0323   RBC 3.15 (L) 02/05/2021 0323   HGB 10.2 (L) 02/05/2021 0323   HGB 12.8 (  L) 07/28/2016 1037   HCT 30.8 (L) 02/05/2021 0323   HCT 38.9 07/28/2016 1037   PLT 272 02/05/2021 0323   PLT 269 07/28/2016 1037   MCV 97.8 02/05/2021 0323   MCV 92.9 07/28/2016 1037   MCH 32.4 02/05/2021 0323   MCHC 33.1 02/05/2021 0323   RDW 13.5 02/05/2021 0323   RDW 13.7 07/28/2016 1037   LYMPHSABS 1.3 02/05/2021 0323   LYMPHSABS 1.8 07/28/2016 1037   MONOABS 1.7 (H) 02/05/2021 0323   MONOABS 1.2 (H) 07/28/2016 1037   EOSABS 0.2 02/05/2021 0323   EOSABS 0.1 07/28/2016  1037   BASOSABS 0.0 02/05/2021 0323   BASOSABS 0.1 07/28/2016 1037   CMP Latest Ref Rng & Units 02/06/2021 02/05/2021 02/04/2021  Glucose 70 - 99 mg/dL 126(H) 104(H) 101(H)  BUN 8 - 23 mg/dL 26(H) 58(H) 36(H)  Creatinine 0.61 - 1.24 mg/dL 3.74(H) 5.73(H) 4.32(H)  Sodium 135 - 145 mmol/L 137 136 135  Potassium 3.5 - 5.1 mmol/L 3.5 4.3 4.3  Chloride 98 - 111 mmol/L 99 95(L) 94(L)  CO2 22 - 32 mmol/L 27 27 24   Calcium 8.9 - 10.3 mg/dL 9.0 9.5 9.1  Total Protein 6.5 - 8.1 g/dL - - -  Total Bilirubin 0.3 - 1.2 mg/dL - - -  Alkaline Phos 38 - 126 U/L - - -  AST 15 - 41 U/L - - -  ALT 0 - 44 U/L - - -     Radiology Studies: No results found.   Scheduled Meds:  aspirin EC  81 mg Oral Daily   Chlorhexidine Gluconate Cloth  6 each Topical Q0600   feeding supplement  1 Container Oral TID BM   heparin  5,000 Units Subcutaneous Q8H   metoprolol tartrate  12.5 mg Oral QID   multivitamin  1 tablet Oral QHS   mupirocin ointment   Topical BID   sevelamer carbonate  800 mg Oral TID WC   tamsulosin  0.4 mg Oral BID   Continuous Infusions:  diltiazem (CARDIZEM) infusion       LOS: 8 days   Time spent: 65  Nita Sells, MD [facility] To contact the attending provider between 7A-7P or the covering provider during after hours 7P-7A, please log into the web site [email or web address] and access using universal [place] password for that web site. If you do not have the password, please call the hospital operator.  02/06/2021, 4:18 PM

## 2021-02-06 NOTE — Progress Notes (Signed)
Occupational Therapy Treatment Patient Details Name: Jeffrey Payne MRN: [id number] DOB: 1933/02/18 Today's Date: 02/06/2021   History of present illness Pt. is 87 yr old M admitted on 10/6 following fall resulting in L hip pain. Imaging (+) for impacted L fem neck fx. CT head/C-spine (-) for acute injury. Pt. underwent L partial hemiarthroplasy (ant/lat approach) on 10/7. Pt. diagnosed with ileus, placed on NG tube 10/11; pulled out NG tube overnight. PMH: anemia, arthritis, prostate CA, CKD, HOH, heart murmur, HTN, ESRD on HD   OT comments  Pt is slowly progressing towards OT goals. Contniues to be limited by pain, balance, and activity tolerance. During session, pt ambulated to bathroom using RW with Min Guard and completed grooming tasks while standing at sink with Min A. Per pt's family and chart, pt preferring SNF over CIR which is appropriate. Will continue to follow acutely.   Recommendations for follow up therapy are one component of a multi-disciplinary discharge planning process, led by the attending physician.  Recommendations may be updated based on patient status, additional functional criteria and insurance authorization.    Follow Up Recommendations  SNF;Supervision/Assistance - 24 hour    Equipment Recommendations  3 in 1 bedside commode;Hospital bed    Recommendations for Other Services      Precautions / Restrictions Precautions Precautions: Anterior Hip Precaution Comments: Precaution sheet in room Restrictions Weight Bearing Restrictions: Yes LLE Weight Bearing: Weight bearing as tolerated       Mobility Bed Mobility Overal bed mobility: Needs Assistance Bed Mobility: Sit to Supine       Sit to supine: Min guard        Transfers Overall transfer level: Needs assistance Equipment used: Rolling walker (2 wheeled) Transfers: Sit to/from Stand Sit to Stand: Min assist              Balance Overall balance assessment: Needs  assistance Sitting-balance support: No upper extremity supported;Feet supported Sitting balance-Leahy Scale: Fair     Standing balance support: During functional activity;Bilateral upper extremity supported;Single extremity supported Standing balance-Leahy Scale: Poor                             ADL either performed or assessed with clinical judgement   ADL Overall ADL's : Needs assistance/impaired     Grooming: Wash/dry hands;Wash/dry face;Minimal assistance;Cueing for safety;Cueing for sequencing;Standing Grooming Details (indicate cue type and reason): Pt ambulated to sink with RW and stood to wash hands and face with VCs for hand placement, safety, and technique.                             Functional mobility during ADLs: Min guard;Rolling walker General ADL Comments: Pt requiring extended time for functional mobility during ADLs due to pain and weakness.     Vision       Perception     Praxis      Cognition Arousal/Alertness: Awake/alert Behavior During Therapy: WFL for tasks assessed/performed Overall Cognitive Status: Within Functional Limits for tasks assessed                                          Exercises     Shoulder Instructions       General Comments      Pertinent Vitals/ Pain  Pain Assessment: Faces Faces Pain Scale: Hurts a little bit Pain Location: L hip Pain Descriptors / Indicators: Aching;Tender;Discomfort Pain Intervention(s): Monitored during session;Repositioned  Home Living                                          Prior Functioning/Environment              Frequency  Min 2X/week        Progress Toward Goals  OT Goals(current goals can now be found in the care plan section)  Progress towards OT goals: Progressing toward goals  Acute Rehab OT Goals Patient Stated Goal: SNF, return home OT Goal Formulation: With patient/family Time For Goal Achievement:  02/15/21 Potential to Achieve Goals: Good ADL Goals Pt Will Perform Grooming: with min guard assist;standing Pt Will Transfer to Toilet: ambulating;bedside commode;with supervision Pt Will Perform Toileting - Clothing Manipulation and hygiene: with supervision;sit to/from stand Additional ADL Goal #1: Pt will be S in and OOB for basic ADLs  Plan Discharge plan needs to be updated;Frequency remains appropriate    Co-evaluation                 AM-PAC OT "6 Clicks" Daily Activity     Outcome Measure   Help from another person eating meals?: None Help from another person taking care of personal grooming?: A Little Help from another person toileting, which includes using toliet, bedpan, or urinal?: A Little Help from another person bathing (including washing, rinsing, drying)?: A Lot Help from another person to put on and taking off regular upper body clothing?: A Little Help from another person to put on and taking off regular lower body clothing?: A Lot 6 Click Score: 17    End of Session Equipment Utilized During Treatment: Gait belt;Rolling walker  OT Visit Diagnosis: Unsteadiness on feet (R26.81);Other abnormalities of gait and mobility (R26.89);Muscle weakness (generalized) (M62.81);Pain Pain - Right/Left: Left Pain - part of body: Hip   Activity Tolerance Patient tolerated treatment well   Patient Left in bed;with call bell/phone within reach;with bed alarm set;with family/visitor present   Nurse Communication Mobility status        Time: 0177-9390 OT Time Calculation (min): 29 min  Charges: OT General Charges $OT Visit: 1 Visit OT Treatments $Self Care/Home Management : 23-37 mins  Tjuana Vickrey C, OT/L  Acute Rehab [place] 02/06/2021, 3:01 PM

## 2021-02-06 NOTE — Progress Notes (Signed)
Physical Therapy Treatment Patient Details Name: Jeffrey Payne MRN: [id number] DOB: 10-Jun-1932 Today's Date: 02/06/2021   History of Present Illness Pt. is 87 yr old M admitted on 10/6 following fall resulting in L hip pain. Imaging (+) for impacted L fem neck fx. CT head/C-spine (-) for acute injury. Pt. underwent L partial hemiarthroplasy (ant/lat approach) on 10/7. Pt. diagnosed with ileus, placed on NG tube 10/11; pulled out NG tube overnight. PMH: anemia, arthritis, prostate CA, CKD, HOH, heart murmur, HTN, ESRD on HD    PT Comments    Pt. Demos improved activity tolerance and LE strength today, able to amb inc distance in room without knee buckling.  However, pt. Continues to demo multiple gait deviations placing him at inc risk for falls and fatigues quickly with short gait distances.  AMPAC score indicating home, but spouse would be unable to provide necessary level of assist/skill to prevent a fall during amb.  Therefore, pt. Would benefit from SNF prior to return home.  PT to continue to work towards inc gait distance and improving safety/gait biomechanics with activity.  Recommendations for follow up therapy are one component of a multi-disciplinary discharge planning process, led by the attending physician.  Recommendations may be updated based on patient status, additional functional criteria and insurance authorization.  Follow Up Recommendations  SNF     Equipment Recommendations  Rolling walker with 5" wheels;3in1 (PT)    Recommendations for Other Services       Precautions / Restrictions Precautions Precautions: Anterior Hip Restrictions Weight Bearing Restrictions: Yes LLE Weight Bearing: Weight bearing as tolerated     Mobility  Bed Mobility   Bed Mobility: Supine to Sit     Supine to sit: Min assist     General bed mobility comments: Requires min A to transfer to EOB for L LE negotiation and HH support to come up to sitting.  VCs to place L UE behind  pelvis to push pelvis forward, able to complete with cueing only. Patient Response: Cooperative  Transfers   Equipment used: Conservation officer, nature (2 wheeled) Transfers: Sit to/from Stand Sit to Stand: Min assist         General transfer comment: Pt. completes sit > stand with min A, continues to require VCs for safety with hand placement as pt. attempts to pull up on RW.  Ambulation/Gait Ambulation/Gait assistance: Min assist Gait Distance (Feet): 12 Feet Assistive device: Rolling walker (2 wheeled) Gait Pattern/deviations: Step-to pattern;Decreased step length - right;Decreased step length - left;Trunk flexed;Leaning posteriorly     General Gait Details: Pt. amb from 1 side of bed around the bed and to the chair in the room.  Requires inc VCs for appropriate gait sequencing, demos improved carryover with practice.  Pt. demos forward flexed posture with slight posterior lean during amb and beenfits from VC/TCs to correct and improve balance.  Pt. fatigues quickly with amb.   Stairs             Wheelchair Mobility    Modified Rankin (Stroke Patients Only)       Balance           Standing balance support: Bilateral upper extremity supported;During functional activity Standing balance-Leahy Scale: Poor                              Cognition Arousal/Alertness: Awake/alert Behavior During Therapy: WFL for tasks assessed/performed Overall Cognitive Status: Within Functional Limits for tasks assessed  General Comments: Supine in bed when PT arrives.  Agreeable to work with PT, understands the goal is to inc amb distance.  Spouse arrives at end of tx - updated on pt. progress.      Exercises General Exercises - Lower Extremity Long Arc Quad: AAROM;Left;20 reps Heel Slides: AAROM;Left;20 reps Straight Leg Raises: AAROM;Left;20 reps Hip Flexion/Marching: AROM;Left;20 reps (VC/TC to limit compensation with rocking  trunk during activity)    General Comments        Pertinent Vitals/Pain Pain Assessment: 0-10 Pain Score: 2  Pain Location: States L LE is "sore", also reports pain in R LE (states it's from laying on his R side overnnight) Pain Descriptors / Indicators: Aching;Tender;Discomfort Pain Intervention(s): Monitored during session;Repositioned    Home Living                      Prior Function            PT Goals (current goals can now be found in the care plan section) Progress towards PT goals: Progressing toward goals    Frequency           PT Plan Current plan remains appropriate    Co-evaluation              AM-PAC PT "6 Clicks" Mobility   Outcome Measure  Help needed turning from your back to your side while in a flat bed without using bedrails?: A Little Help needed moving from lying on your back to sitting on the side of a flat bed without using bedrails?: A Little Help needed moving to and from a bed to a chair (including a wheelchair)?: A Little Help needed standing up from a chair using your arms (e.g., wheelchair or bedside chair)?: A Little Help needed to walk in hospital room?: A Little Help needed climbing 3-5 steps with a railing? : A Lot 6 Click Score: 17    End of Session Equipment Utilized During Treatment: Gait belt Activity Tolerance: Patient tolerated treatment well;Patient limited by fatigue Patient left: in chair;with call bell/phone within reach;with chair alarm set;with family/visitor present         Time: 7564-3329 PT Time Calculation (min) (ACUTE ONLY): 27 min  Charges:  $Gait Training: 8-22 mins $Therapeutic Exercise: 8-22 mins                     Recardo Linn A. Eriverto Byrnes, PT, DPT Acute Rehabilitation Services Office: [place] 02/06/2021, 10:24 AM

## 2021-02-06 NOTE — Progress Notes (Signed)
[facility] Progress Note    Dialysis Orders: TTS at [facility] 4hr, 400/700, EDW 54.5kg, 2K/2Ca, R AVF,  - heparin 4000 units IV TIW - No ESA, VDRA (fairly recent start)   Assessment/Plan: 1. L hip fracture s/p fall: s/pL hemiarthroplasty 01/30/2021 by ortho. 2. ESRD: Continue HD on TTS schedule; net UF 1.25L on 10/13. next treatment planned for 10/15 3. HTN/volume: Mild hypoxia/pulm edem on admit-improved. Vol status acceptable today 4. Anemia: Hgb 11.1 on admit. Down to 9.7 10/08. Given Aranesp 25 mcg IV 01/31/21 Follow HGB. Transfuse if needed. hgb 10.2 10/13 5. Secondary hyperparathyroidism: CorrCa high, Not binders or VDRA. Use 2.0 Ca bath. phos restricted diet. Started renvela (P down to 4.1 on 10/14) 6. Nutrition: Alb low,push protein supplements 7. Hx urinary retention: has foley catheter. Per primary 8. Ileus: per primary 9. MAT: seen on admission, not in Afib.  SR with PACS at present. per primary. On BB.  10. Leukocytosis: Unclear, per primary. afebrile 11. Scalp laceration s/p fall 12. Access: hematoma around avf, seems to have good flow, keep arm elevated, stick away from the hematoma (I removed the bandages this AM and no bleeding)  Subjective: Some tenderness around the hematoma better, no issues with cannulating yest. No complaints but seems a little confused this AM; he keeps saying that he just woke up.  Objective Vitals:   02/05/21 1446 02/05/21 1550 02/05/21 1800 02/05/21 2009  BP: (!) 122/101 96/77 (!) 97/57 (!) 121/41  Pulse: (!) 104 98  69  Resp: 18 16 16 20   Temp: 98.5 F (36.9 C) 97.8 F (36.6 C) 98.1 F (36.7 C) 98.7 F (37.1 C)  TempSrc: Oral Oral Oral Oral  SpO2:   96% 98%  Weight:      Height:       Physical Exam General: NAD Heart: S1,S2 RRR No M/R/G Lungs: CTAB Anteriorly. No WOB.  Abdomen: Soft, distended. Foley patent to gravity drain, clear yellow urine.  Extremities: No LE edema. Dialysis Access: AVF + T/B Hematoma upper  portion on AVF tender but not compromising flow (improving).    Additional Objective Labs: Basic Metabolic Panel: Recent Labs  Lab 02/04/21 0133 02/05/21 0323 02/06/21 0248  NA 135 136 137  K 4.3 4.3 3.5  CL 94* 95* 99  CO2 24 27 27   GLUCOSE 101* 104* 126*  BUN 36* 58* 26*  CREATININE 4.32* 5.73* 3.74*  CALCIUM 9.1 9.5 9.0  PHOS 5.2* 5.5* 4.1   Liver Function Tests: Recent Labs  Lab 02/04/21 0133 02/05/21 0323 02/06/21 0248  ALBUMIN 2.6* 2.4* 2.5*   No results for input(s): LIPASE, AMYLASE in the last 168 hours.  CBC: Recent Labs  Lab 02/01/21 0305 02/02/21 0153 02/03/21 0246 02/04/21 0133 02/05/21 0323  WBC 12.3* 13.0* 13.8* 15.4* 14.7*  NEUTROABS  --   --   --   --  11.4*  HGB 10.1* 10.0* 10.4* 10.8* 10.2*  HCT 31.0* 29.9* 31.4* 32.9* 30.8*  MCV 99.4 97.4 96.9 98.5 97.8  PLT 186 230 265 250 272   Blood Culture    Component Value Date/Time   SDES  04/21/2020 0328    BLOOD RIGHT HAND Performed at [facility], [place] [address], [place], [place] [address]    SPECREQUEST  04/21/2020 0328    BOTTLES DRAWN AEROBIC ONLY Blood Culture adequate volume Performed at [facility] [address], [place], [place] [address]    CULT  04/21/2020 0328    NO GROWTH 5 DAYS  Performed at [facility], [place] [address], [place], [place] [address]    REPTSTATUS 04/26/2020 FINAL 04/21/2020 0328    Cardiac Enzymes: No results for input(s): CKTOTAL, CKMB, CKMBINDEX, TROPONINI in the last 168 hours. CBG: Recent Labs  Lab 01/31/21 1157  GLUCAP 112*   Iron Studies: No results for input(s): IRON, TIBC, TRANSFERRIN, FERRITIN in the last 72 hours. @lablastinr3 @ Studies/Results: DG Chest 2 View  Result Date: 02/04/2021 CLINICAL DATA:  Pneumonia EXAM: CHEST - 2 VIEW COMPARISON:  02/01/2021 FINDINGS: Heart size upper normal. Negative for heart failure. Atherosclerotic calcification aortic arch. Improved aeration in the left lung  base. Right lung remains clear. No pleural effusion. Calcified granuloma left upper lobe unchanged. IMPRESSION: Improving left lower lobe atelectasis. Electronically Signed   By: Franchot Gallo M.D.   On: 02/04/2021 14:53   Medications:  diltiazem (CARDIZEM) infusion      aspirin EC  81 mg Oral Daily   Chlorhexidine Gluconate Cloth  6 each Topical Q0600   feeding supplement  1 Container Oral TID BM   heparin  5,000 Units Subcutaneous Q8H   metoprolol tartrate  12.5 mg Oral BID   multivitamin  1 tablet Oral QHS   mupirocin ointment   Topical BID   sevelamer carbonate  800 mg Oral TID WC   tamsulosin  0.4 mg Oral BID

## 2021-02-06 NOTE — Progress Notes (Addendum)
Progress Note  Patient Name: Jeffrey Payne Date of Encounter: 02/06/2021  Primary Cardiologist: Minus Breeding, MD  Subjective   Today has been a good day so far, feeling more like himself, able to get out of bed and sit in chair. Currently eating lunch. Unaware of any palpitations. No CP or SOB.  Inpatient Medications    Scheduled Meds:  aspirin EC  81 mg Oral Daily   Chlorhexidine Gluconate Cloth  6 each Topical Q0600   feeding supplement  1 Container Oral TID BM   heparin  5,000 Units Subcutaneous Q8H   metoprolol tartrate  12.5 mg Oral BID   metoprolol tartrate  12.5 mg Oral Once   multivitamin  1 tablet Oral QHS   mupirocin ointment   Topical BID   sevelamer carbonate  800 mg Oral TID WC   tamsulosin  0.4 mg Oral BID   Continuous Infusions:  diltiazem (CARDIZEM) infusion     PRN Meds: metoprolol tartrate, oxyCODONE   Vital Signs    Vitals:   02/05/21 1800 02/05/21 2009 02/06/21 0954 02/06/21 1017  BP: (!) 97/57 (!) 121/41 (!) 112/42   Pulse:  69 66   Resp: 16 20    Temp: 98.1 F (36.7 C) 98.7 F (37.1 C)    TempSrc: Oral Oral    SpO2: 96% 98% 95% 94%  Weight:      Height:        Intake/Output Summary (Last 24 hours) at 02/06/2021 1138 Last data filed at 02/05/2021 1300 Gross per 24 hour  Intake 120 ml  Output 1245 ml  Net -1125 ml   Last 3 Weights 02/05/2021 02/03/2021 02/03/2021  Weight (lbs) 108 lb 7.5 oz 109 lb 2 oz 112 lb 7 oz  Weight (kg) 49.2 kg 49.5 kg 51 kg     Telemetry    NSR with brief runs of narrow complex tachycardia at times appearing SVT, MAT, +/- possible afib - Personally Reviewed  Physical Exam   GEN: No acute distress. Very frail appearing HEENT: Normocephalic, atraumatic, sclera non-icteric. Neck: No JVD or bruits. Cardiac: RRR no murmurs, rubs, or gallops.  Respiratory: Clear to auscultation bilaterally. Breathing is unlabored. GI: Soft, nontender, non-distended, BS +x 4. MS: general diffuse atrophy Extremities:  No clubbing or cyanosis. No edema. Distal pedal pulses are 2+ and equal bilaterally. Neuro:  AAOx3. Follows commands. Psych:  Responds to questions appropriately with a normal affect.  Labs    High Sensitivity Troponin:   Recent Labs  Lab 02/05/21 1642 02/05/21 1830  TROPONINIHS 65* 72*      Cardiac EnzymesNo results for input(s): TROPONINI in the last 168 hours. No results for input(s): TROPIPOC in the last 168 hours.   Chemistry Recent Labs  Lab 02/04/21 0133 02/05/21 0323 02/06/21 0248  NA 135 136 137  K 4.3 4.3 3.5  CL 94* 95* 99  CO2 24 27 27   GLUCOSE 101* 104* 126*  BUN 36* 58* 26*  CREATININE 4.32* 5.73* 3.74*  CALCIUM 9.1 9.5 9.0  ALBUMIN 2.6* 2.4* 2.5*  GFRNONAA 13* 9* 15*  ANIONGAP 17* 14 11     Hematology Recent Labs  Lab 02/03/21 0246 02/04/21 0133 02/05/21 0323  WBC 13.8* 15.4* 14.7*  RBC 3.24* 3.34* 3.15*  HGB 10.4* 10.8* 10.2*  HCT 31.4* 32.9* 30.8*  MCV 96.9 98.5 97.8  MCH 32.1 32.3 32.4  MCHC 33.1 32.8 33.1  RDW 13.3 13.5 13.5  PLT 265 250 272    BNPNo results for input(s): BNP, PROBNP  in the last 168 hours.   DDimer No results for input(s): DDIMER in the last 168 hours.   Radiology    DG Chest 2 View  Result Date: 02/04/2021 CLINICAL DATA:  Pneumonia EXAM: CHEST - 2 VIEW COMPARISON:  02/01/2021 FINDINGS: Heart size upper normal. Negative for heart failure. Atherosclerotic calcification aortic arch. Improved aeration in the left lung base. Right lung remains clear. No pleural effusion. Calcified granuloma left upper lobe unchanged. IMPRESSION: Improving left lower lobe atelectasis. Electronically Signed   By: Franchot Gallo M.D.   On: 02/04/2021 14:53    Cardiac Studies   2D Echo 02/01/21   1. Left ventricular ejection fraction, by estimation, is 35 to 40%. Left  ventricular ejection fraction by 3D volume is 40 %. The left ventricle has  moderately decreased function. The left ventricle demonstrates global  hypokinesis. Left  ventricular  diastolic parameters are consistent with Grade I diastolic dysfunction  (impaired relaxation). Elevated left atrial pressure.   2. Right ventricular systolic function is normal. The right ventricular  size is normal. Tricuspid regurgitation signal is inadequate for assessing  PA pressure.   3. Left atrial size was moderately dilated.   4. The mitral valve is normal in structure. Trivial mitral valve  regurgitation.   5. The aortic valve is tricuspid. There is mild calcification of the  aortic valve. There is moderate thickening of the aortic valve. Aortic  valve regurgitation is moderate. Mild to moderate aortic valve  sclerosis/calcification is present, without any  evidence of aortic stenosis.   6. The inferior vena cava is normal in size with greater than 50%  respiratory variability, suggesting right atrial pressure of 3 mmHg.   Comparison(s): Prior images reviewed side by side. The left ventricular  function is worsened.   Patient Profile     87 y.o. male with mild diastolic dysfunction, ESRD on HD, GERD, HTN, HLD, anemia, arthritis, habitual ETOH intake, ED who presented to [facility] 01/29/21 with fall and hip fracture s/p repair 01/30/21. During admission, also found to have new LBBB, LV dysfunction EF 35-40%, moderate AI. Post op he had issues with ileus tx with NGT but patient pulled it out. Other issues this admission include leukocytosis of unclear etiology, BPH, scalp laceration, and episodes of PAF on telemetry.  Assessment & Plan    1. Fall with hip fracture - per primary team  2. Acute combined CHF with LV dysfunction of unclear chronicity with new LBBB - suspected ischemic etiology but given his frailty and advanced age, ischemic workup deferred in lieu of medical therapy - hs Troponin low level elevated at 65-72 yesterday - no chest pain presently, continue conservative management - hypotension has precluded GDMT titration (BP dropped to 60 with dialysis)  3.  Paroxysmal atrial fibrillation - runs of PAF reported this admission, also possibly SVT/MAT by telemetry review - given his frailty and comorbid conditions, MD felt poor candidate for DOAC - check baseline TSH in AM - electrolyte management per primary teams - currently on metoprpolol 12.5mg   - ?consider amiodarone given hypotension, paroxysms  4. ESRD on HD - per nephrology  For questions or updates, please contact [place] Please consult [email or web address] for contact info under Cardiology/STEMI.  Signed, Charlie Pitter, PA-C 02/06/2021, 11:38 AM    History and all data above reviewed.  Patient examined.  I agree with the findings as above.  He has had PAF and does not feel this.  Rates have been elevated  The patient exam reveals [id number]   ,  Lungs: Clear  ,  Abd: Positive bowel sounds, no rebound no guarding, Ext No edema  .  All available labs, radiology testing, previous records reviewed. Agree with documented assessment and plan.   Atrial fib: He is tolerating bid metoprolol tartrate.  I will increase this to QID in the hopes we will be able to control the rate with out accelerating therapies.  No anticoagulation with his very high risk for bleeding.    Jeneen Rinks Corayma [place]t  12:49 PM  02/06/2021

## 2021-02-07 DIAGNOSIS — I471 Supraventricular tachycardia: Secondary | ICD-10-CM | POA: Diagnosis not present

## 2021-02-07 DIAGNOSIS — S72002A Fracture of unspecified part of neck of left femur, initial encounter for closed fracture: Secondary | ICD-10-CM | POA: Diagnosis not present

## 2021-02-07 LAB — CBC
HCT: 30.8 % — ABNORMAL LOW (ref 39.0–52.0)
Hemoglobin: 10 g/dL — ABNORMAL LOW (ref 13.0–17.0)
MCH: 32.1 pg (ref 26.0–34.0)
MCHC: 32.5 g/dL (ref 30.0–36.0)
MCV: 98.7 fL (ref 80.0–100.0)
Platelets: 344 10*3/uL (ref 150–400)
RBC: 3.12 MIL/uL — ABNORMAL LOW (ref 4.22–5.81)
RDW: 13.3 % (ref 11.5–15.5)
WBC: 14.5 10*3/uL — ABNORMAL HIGH (ref 4.0–10.5)
nRBC: 0 % (ref 0.0–0.2)

## 2021-02-07 LAB — RENAL FUNCTION PANEL
Albumin: 2.4 g/dL — ABNORMAL LOW (ref 3.5–5.0)
Anion gap: 12 (ref 5–15)
BUN: 46 mg/dL — ABNORMAL HIGH (ref 8–23)
CO2: 30 mmol/L (ref 22–32)
Calcium: 9.3 mg/dL (ref 8.9–10.3)
Chloride: 94 mmol/L — ABNORMAL LOW (ref 98–111)
Creatinine, Ser: 5.21 mg/dL — ABNORMAL HIGH (ref 0.61–1.24)
GFR, Estimated: 10 mL/min — ABNORMAL LOW (ref >60)
Glucose, Bld: 118 mg/dL — ABNORMAL HIGH (ref 70–99)
Phosphorus: 4.8 mg/dL — ABNORMAL HIGH (ref 2.5–4.6)
Potassium: 4 mmol/L (ref 3.5–5.1)
Sodium: 136 mmol/L (ref 135–145)

## 2021-02-07 LAB — TSH: TSH: 4.116 u[IU]/mL (ref 0.350–4.500)

## 2021-02-07 MED ORDER — ALBUMIN HUMAN 25 % IV SOLN: 25.0000 g | Freq: Once | INTRAVENOUS | Status: AC

## 2021-02-07 MED ORDER — ALBUMIN HUMAN 25 % IV SOLN
INTRAVENOUS | Status: AC
  Administered 2021-02-07: 25 g via INTRAVENOUS
  Filled 2021-02-07: qty 100

## 2021-02-07 MED ORDER — MIDODRINE HCL 5 MG PO TABS
5.0000 mg | ORAL_TABLET | Freq: Three times a day (TID) | ORAL | Status: DC
  Administered 2021-02-07 – 2021-02-11 (×11): 5 mg via ORAL
  Filled 2021-02-07 (×11): qty 1

## 2021-02-07 MED ORDER — ACETAMINOPHEN 325 MG PO TABS
650.0000 mg | ORAL_TABLET | Freq: Four times a day (QID) | ORAL | Status: DC | PRN
  Administered 2021-02-07 – 2021-02-10 (×2): 650 mg via ORAL
  Filled 2021-02-07 (×2): qty 2

## 2021-02-07 NOTE — Progress Notes (Signed)
Dr. Augustin Coupe notified of patient heart rate and interventions. Verbal orders given for an additional bolus of normal saline 124ml and dose of albumin 25 g x1.

## 2021-02-07 NOTE — Progress Notes (Signed)
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 Sammuel Hines 01/30/2021 Can be weightbearing on left leg-Staples removed 2 weeks out Cont tylenol for mild pain--Oxycodone 5 every 4 as needed pain management Patient to SNF when all medical issues improved New onset systolic heart failure Left bundle branch block /paroxysmal A. Fib with RVR EF this admission is 35-40% which is a reduction from prior Volume managed by HD currently euvolemic--OP ischemic eval? Flipped into rapid Afib 10/13 --paroxysmal--meds adjusted by Cards to metoprolol 12.5 QID as pressure allows Poor AC candidate  ESRD TTS Defer to nephrology-volume status appears appropriate May need to adjust EDW goals given hypotension?  Message sent to nephro--given albumin during HD 10/15 Starting midodrine 5 mg tid Leukocytosis NOS 2 view CXR shows only atelectasis Etiology of leukocytosis is unclear at this time Ileus postoperatively Pulled out NG--resolved 10/14 Diet graduated tolerating Advance to full solids today and is tolerating them BPH with urinary obstruction Flomax 0.4 Scalp laceration Continue mupirocin and foam dressing   DVT prophylaxis: Heparin Code Status: Full CODE STATUS Family Communication: called wife leatha [place] [phone] and discussed with her Disposition:  Status is: Inpatient  Remains inpatient appropriate  because:Hemodynamically unstable, Unsafe d/c plan, and IV treatments appropriate due to intensity of illness or inability to take PO  Dispo: The patient is from: Home              Anticipated d/c is to: SNF              Patient currently is not medically stable to d/c.   Difficult to place patient No  Consultants:  Orthopedics Renal Cardiology  Procedures: Hip repair 10/7  Antimicrobials:     Subjective:  BP dropped in HD Felt poorly No cp   Objective: Vitals:   02/07/21 1100 02/07/21 1108 02/07/21 1232 02/07/21 1430  BP: (!) 83/57 98/61 (!) 108/40 (!) 117/39  Pulse:  (!) 128 72 80  Resp:  20 18   Temp:  (!) 97.2 F (36.2 C) (!) 97.5 F (36.4 C)   TempSrc:  Oral Oral   SpO2:  97% 98%   Weight:      Height:        Intake/Output Summary (Last 24 hours) at 02/07/2021 1511 Last data filed at 02/07/2021 1500 Gross per 24 hour  Intake 120 ml  Output --  Net 120 ml    Filed Weights   02/03/21 1550 02/05/21 0902 02/07/21 0722  Weight: 49.5 kg 49.2 kg 48.2 kg    Examination:  Alert awake eating EOMI NCAT no focal deficit Neck soft supple Chest clear no rales rhonchi S1 s 2no m--seems to have sinus arrythmia  Abdomen slightly distended  Fistula intact with less swelling Overall improved -power grossly intact   Data Reviewed: personally reviewed   CBC    Component Value Date/Time   WBC 14.5 (H) 02/07/2021 0631   RBC 3.12 (L) 02/07/2021 0631   HGB 10.0 (L) 02/07/2021 0631   HGB 12.8 (L) 07/28/2016 1037  HCT 30.8 (L) 02/07/2021 0631   HCT 38.9 07/28/2016 1037   PLT 344 02/07/2021 0631   PLT 269 07/28/2016 1037   MCV 98.7 02/07/2021 0631   MCV 92.9 07/28/2016 1037   MCH 32.1 02/07/2021 0631   MCHC 32.5 02/07/2021 0631   RDW 13.3 02/07/2021 0631   RDW 13.7 07/28/2016 1037   LYMPHSABS 1.3 02/05/2021 0323   LYMPHSABS 1.8 07/28/2016 1037   MONOABS 1.7 (H) 02/05/2021 0323   MONOABS 1.2 (H) 07/28/2016 1037   EOSABS 0.2 02/05/2021 0323   EOSABS 0.1  07/28/2016 1037   BASOSABS 0.0 02/05/2021 0323   BASOSABS 0.1 07/28/2016 1037   CMP Latest Ref Rng & Units 02/07/2021 02/06/2021 02/05/2021  Glucose 70 - 99 mg/dL 118(H) 126(H) 104(H)  BUN 8 - 23 mg/dL 46(H) 26(H) 58(H)  Creatinine 0.61 - 1.24 mg/dL 5.21(H) 3.74(H) 5.73(H)  Sodium 135 - 145 mmol/L 136 137 136  Potassium 3.5 - 5.1 mmol/L 4.0 3.5 4.3  Chloride 98 - 111 mmol/L 94(L) 99 95(L)  CO2 22 - 32 mmol/L 30 27 27   Calcium 8.9 - 10.3 mg/dL 9.3 9.0 9.5  Total Protein 6.5 - 8.1 g/dL - - -  Total Bilirubin 0.3 - 1.2 mg/dL - - -  Alkaline Phos 38 - 126 U/L - - -  AST 15 - 41 U/L - - -  ALT 0 - 44 U/L - - -     Radiology Studies: No results found.   Scheduled Meds:  aspirin EC  81 mg Oral Daily   Chlorhexidine Gluconate Cloth  6 each Topical Q0600   feeding supplement  1 Container Oral TID BM   heparin  5,000 Units Subcutaneous Q8H   metoprolol tartrate  12.5 mg Oral QID   midodrine  5 mg Oral TID WC   multivitamin  1 tablet Oral QHS   mupirocin ointment   Topical BID   sevelamer carbonate  800 mg Oral TID WC   tamsulosin  0.4 mg Oral BID   Continuous Infusions:    LOS: 9 days   Time spent: 1  Nita Sells, MD [facility] To contact the attending provider between 7A-7P or the covering provider during after hours 7P-7A, please log into the web site [email or web address] and access using universal [place] password for that web site. If you do not have the password, please call the hospital operator.  02/07/2021, 3:11 PM

## 2021-02-07 NOTE — Progress Notes (Addendum)
Progress Note  Patient Name: Jeffrey Payne Date of Encounter: 02/07/2021  Primary Cardiologist: Minus Breeding, MD  Subjective   Worsened from prior.  Notes that he had a rough go with dialysis.  Blood pressure was as low as SBP 78.  Patient notes that he has been having problems with HD and doesn't know what the best next step is.  Inpatient Medications    Scheduled Meds:  aspirin EC  81 mg Oral Daily   Chlorhexidine Gluconate Cloth  6 each Topical Q0600   feeding supplement  1 Container Oral TID BM   heparin  5,000 Units Subcutaneous Q8H   metoprolol tartrate  12.5 mg Oral QID   multivitamin  1 tablet Oral QHS   mupirocin ointment   Topical BID   sevelamer carbonate  800 mg Oral TID WC   tamsulosin  0.4 mg Oral BID   Continuous Infusions:  diltiazem (CARDIZEM) infusion     PRN Meds: metoprolol tartrate, oxyCODONE   Vital Signs    Vitals:   02/07/21 1059 02/07/21 1100 02/07/21 1108 02/07/21 1232  BP: (!) 83/57 (!) 83/57 98/61 (!) 108/40  Pulse: 89  (!) 128 72  Resp:   20 18  Temp:   (!) 97.2 F (36.2 C) (!) 97.5 F (36.4 C)  TempSrc:   Oral Oral  SpO2:   97% 98%  Weight:      Height:        Intake/Output Summary (Last 24 hours) at 02/07/2021 1343 Last data filed at 02/06/2021 2250 Gross per 24 hour  Intake 120 ml  Output --  Net 120 ml   Last 3 Weights 02/07/2021 02/05/2021 02/03/2021  Weight (lbs) 106 lb 4.2 oz 108 lb 7.5 oz 109 lb 2 oz  Weight (kg) 48.2 kg 49.2 kg 49.5 kg     Telemetry    SR with short runs of SVT- Personally Reviewed  Physical Exam   GEN: No acute distress. Very frail appearing HEENT: Normocephalic, atraumatic, sclera non-icteric. Neck: No JVD or bruits. Cardiac: RRR no murmurs, rubs, or gallops.  Respiratory: Clear to auscultation bilaterally.Slight tachypnea GI: Soft, nontender, non-distended, BS +x 4. MS: general diffuse atrophy Extremities: No clubbing or cyanosis. No edema. Distal pedal pulses are 2+ and equal  bilaterally. Neuro:  AAOx3. Follows commands. Psych:  Responds to questions appropriately but reiterates that he feels ab it confused.  Labs    High Sensitivity Troponin:   Recent Labs  Lab 02/05/21 1642 02/05/21 1830  TROPONINIHS 65* 72*      Cardiac EnzymesNo results for input(s): TROPONINI in the last 168 hours. No results for input(s): TROPIPOC in the last 168 hours.   Chemistry Recent Labs  Lab 02/05/21 0323 02/06/21 0248 02/07/21 0230  NA 136 137 136  K 4.3 3.5 4.0  CL 95* 99 94*  CO2 27 27 30   GLUCOSE 104* 126* 118*  BUN 58* 26* 46*  CREATININE 5.73* 3.74* 5.21*  CALCIUM 9.5 9.0 9.3  ALBUMIN 2.4* 2.5* 2.4*  GFRNONAA 9* 15* 10*  ANIONGAP 14 11 12      Hematology Recent Labs  Lab 02/04/21 0133 02/05/21 0323 02/07/21 0631  WBC 15.4* 14.7* 14.5*  RBC 3.34* 3.15* 3.12*  HGB 10.8* 10.2* 10.0*  HCT 32.9* 30.8* 30.8*  MCV 98.5 97.8 98.7  MCH 32.3 32.4 32.1  MCHC 32.8 33.1 32.5  RDW 13.5 13.5 13.3  PLT 250 272 344    BNPNo results for input(s): BNP, PROBNP in the last 168 hours.  DDimer No results for input(s): DDIMER in the last 168 hours.   Radiology    No results found.  Cardiac Studies   2D Echo 02/01/21   1. Left ventricular ejection fraction, by estimation, is 35 to 40%. Left  ventricular ejection fraction by 3D volume is 40 %. The left ventricle has  moderately decreased function. The left ventricle demonstrates global  hypokinesis. Left ventricular  diastolic parameters are consistent with Grade I diastolic dysfunction  (impaired relaxation). Elevated left atrial pressure.   2. Right ventricular systolic function is normal. The right ventricular  size is normal. Tricuspid regurgitation signal is inadequate for assessing  PA pressure.   3. Left atrial size was moderately dilated.   4. The mitral valve is normal in structure. Trivial mitral valve  regurgitation.   5. The aortic valve is tricuspid. There is mild calcification of the   aortic valve. There is moderate thickening of the aortic valve. Aortic  valve regurgitation is moderate. Mild to moderate aortic valve  sclerosis/calcification is present, without any  evidence of aortic stenosis.   6. The inferior vena cava is normal in size with greater than 50%  respiratory variability, suggesting right atrial pressure of 3 mmHg.   Comparison(s): Prior images reviewed side by side. The left ventricular  function is worsened.   Patient Profile     87 y.o. male with mild diastolic dysfunction, ESRD on HD, GERD, HTN, HLD, anemia, arthritis, habitual ETOH intake, ED who presented to [facility] 01/29/21 with fall and hip fracture s/p repair 01/30/21. During admission, also found to have new LBBB, LV dysfunction EF 35-40%, moderate AI. Post op he had issues with ileus tx with NGT but patient pulled it out. Other issues this admission include leukocytosis of unclear etiology, BPH, scalp laceration, and episodes of PAF on telemetry.  Assessment & Plan    1. Fall with hip fracture - per primary team  2. Acute combined CHF with LV dysfunction of unclear chronicity with new LBBB - suspected ischemic etiology but given his frailty and advanced age, ischemic workup deferred in lieu of medical therapy - hs Troponin low level elevated at 65-72 yesterday - no chest pain presently, continue conservative management - hypotension has precluded GDMT titration  - has minimal tolerated low dose BB (tartrate)  3. Paroxysmal atrial fibrillation vs MAT - runs of PAF reported this admission none over last 24 hours - electrolyte management per primary teams - currently on metoprpolol 12.5mg  but has not receive dose 02/07/21; if continues to not tolerate BB will have only PRN metoprolol for sustained tachycardia - agree with prior assessment, poor candidate for AC  4. ESRD on HD - per nephrology  For questions or updates, please contact [place] Please consult [email or web address] for contact info  under Cardiology/STEMI.  Signed, Werner Lean, MD 02/07/2021, 1:43 PM

## 2021-02-07 NOTE — Progress Notes (Signed)
Normal saline bolus and albumin given for hypotension and tachycardia per verbal order from Dr. Awilda Metro

## 2021-02-07 NOTE — Progress Notes (Signed)
Uf goal decreased. 100 normal saline given.

## 2021-02-07 NOTE — Progress Notes (Signed)
Heart rate in 140's bp checked. UF off 100 normal saline given.

## 2021-02-07 NOTE — Progress Notes (Signed)
[facility] Progress Note    Dialysis Orders: TTS at [facility] 4hr, 400/700, EDW 54.5kg, 2K/2Ca, R AVF,  - heparin 4000 units IV TIW - No ESA, VDRA (fairly recent start)   Assessment/Plan: 1. L hip fracture s/p fall: s/pL hemiarthroplasty 01/30/2021 by ortho. 2. ESRD: Continue HD on TTS schedule; net UF 1.25L on 10/13.  Seen on HD through [facility] but still issues with cannulation 2K bath 93/45 2L net UF goal  Will request VIR fistulogram for difficult cannulation; hopefully won't need a TC.  Next treatment planned for Tues  3. HTN/volume: Mild hypoxia/pulm edem on admit-improved. Vol status acceptable today 4. Anemia: Hgb 11.1 on admit. Down to 9.7 10/08. Given Aranesp 25 mcg IV 01/31/21 Follow HGB. Transfuse if needed. hgb 10.2 10/13 5. Secondary hyperparathyroidism: CorrCa high, Not binders or VDRA. Use 2.0 Ca bath. phos restricted diet. Started renvela (P down to 4.1 on 10/14) 6. Nutrition: Alb low,push protein supplements 7. Hx urinary retention: has foley catheter. Per primary 8. Ileus: per primary 9. MAT: seen on admission, not in Afib.  SR with PACS at present. per primary. On BB.  10. Leukocytosis: Unclear, per primary. afebrile 11. Scalp laceration s/p fall 12. Access: hematoma around avf, seems to have good flow, keep arm elevated, stick away from the hematoma (I removed the bandages this AM and no bleeding)  Subjective: Some tenderness around the hematoma better, issues with cannulating again today. No complaints but seems a little confused this AM.  Objective Vitals:   02/07/21 0747 02/07/21 0800 02/07/21 0830 02/07/21 0900  BP: (!) 78/41 (!) 94/40 (!) 98/42 (!) 93/40  Pulse:  61 (!) 59 62  Resp:      Temp:      TempSrc:      SpO2:      Weight:      Height:       Physical Exam General: NAD Heart: S1,S2 RRR No M/R/G Lungs: CTAB Anteriorly. No WOB.  Abdomen: Soft, distended. Foley patent to gravity drain, clear yellow urine.  Extremities: No LE  edema. Dialysis Access: AVF + T/B Hematoma upper portion on AVF tender but not compromising flow (improving).    Additional Objective Labs: Basic Metabolic Panel: Recent Labs  Lab 02/05/21 0323 02/06/21 0248 02/07/21 0230  NA 136 137 136  K 4.3 3.5 4.0  CL 95* 99 94*  CO2 27 27 30   GLUCOSE 104* 126* 118*  BUN 58* 26* 46*  CREATININE 5.73* 3.74* 5.21*  CALCIUM 9.5 9.0 9.3  PHOS 5.5* 4.1 4.8*   Liver Function Tests: Recent Labs  Lab 02/05/21 0323 02/06/21 0248 02/07/21 0230  ALBUMIN 2.4* 2.5* 2.4*   No results for input(s): LIPASE, AMYLASE in the last 168 hours.  CBC: Recent Labs  Lab 02/02/21 0153 02/03/21 0246 02/04/21 0133 02/05/21 0323 02/07/21 0631  WBC 13.0* 13.8* 15.4* 14.7* 14.5*  NEUTROABS  --   --   --  11.4*  --   HGB 10.0* 10.4* 10.8* 10.2* 10.0*  HCT 29.9* 31.4* 32.9* 30.8* 30.8*  MCV 97.4 96.9 98.5 97.8 98.7  PLT 230 265 250 272 344   Blood Culture    Component Value Date/Time   SDES  04/21/2020 0328    BLOOD RIGHT HAND Performed at [facility], [place] [address] [place], [place] [address]    SPECREQUEST  04/21/2020 0328    BOTTLES DRAWN AEROBIC ONLY Blood Culture adequate volume Performed at [facility] [address] [place], [place]  [place]  04/21/2020 0328    NO GROWTH 5 DAYS Performed at [facility], [place] [address] [place], [place] [address]    REPTSTATUS 04/26/2020 FINAL 04/21/2020 0328    Cardiac Enzymes: No results for input(s): CKTOTAL, CKMB, CKMBINDEX, TROPONINI in the last 168 hours. CBG: Recent Labs  Lab 01/31/21 1157 02/06/21 1551  GLUCAP 112* 132*   Iron Studies: No results for input(s): IRON, TIBC, TRANSFERRIN, FERRITIN in the last 72 hours. @lablastinr3 @ Studies/Results: No results found. Medications:  diltiazem (CARDIZEM) infusion      aspirin EC  81 mg Oral Daily   Chlorhexidine Gluconate Cloth  6 each Topical Q0600   feeding supplement  1  Container Oral TID BM   heparin  5,000 Units Subcutaneous Q8H   metoprolol tartrate  12.5 mg Oral QID   multivitamin  1 tablet Oral QHS   mupirocin ointment   Topical BID   sevelamer carbonate  800 mg Oral TID WC   tamsulosin  0.4 mg Oral BID

## 2021-02-07 NOTE — Progress Notes (Signed)
Bp rechecked.  

## 2021-02-07 NOTE — Progress Notes (Signed)
Patient with tachycardia and hypotension. Heart rate noted to be in 140's. UF was turned off and saline bolus was given. Dr. Augustin Coupe notified of bp and heart rate. Additional bolus of saline and albumin ordered. Saline and albumin administered per order. Bp 98/61 Heart rate 128 at end of treatment following rinse back. Patient with issues with arterial cannulation. Patient cannulated twice. Clotting noted in needles. Clotting noted in dialyzer. Machine restrung. Dr. Augustin Coupe notified.

## 2021-02-08 DIAGNOSIS — I471 Supraventricular tachycardia: Secondary | ICD-10-CM

## 2021-02-08 DIAGNOSIS — I5041 Acute combined systolic (congestive) and diastolic (congestive) heart failure: Secondary | ICD-10-CM

## 2021-02-08 DIAGNOSIS — I480 Paroxysmal atrial fibrillation: Secondary | ICD-10-CM | POA: Diagnosis not present

## 2021-02-08 LAB — RENAL FUNCTION PANEL
Albumin: 2.5 g/dL — ABNORMAL LOW (ref 3.5–5.0)
Anion gap: 11 (ref 5–15)
BUN: 29 mg/dL — ABNORMAL HIGH (ref 8–23)
CO2: 28 mmol/L (ref 22–32)
Calcium: 9.1 mg/dL (ref 8.9–10.3)
Chloride: 97 mmol/L — ABNORMAL LOW (ref 98–111)
Creatinine, Ser: 3.62 mg/dL — ABNORMAL HIGH (ref 0.61–1.24)
GFR, Estimated: 16 mL/min — ABNORMAL LOW (ref >60)
Glucose, Bld: 110 mg/dL — ABNORMAL HIGH (ref 70–99)
Phosphorus: 4 mg/dL (ref 2.5–4.6)
Potassium: 3.7 mmol/L (ref 3.5–5.1)
Sodium: 136 mmol/L (ref 135–145)

## 2021-02-08 MED ORDER — NEPRO/CARBSTEADY PO LIQD
237.0000 mL | Freq: Three times a day (TID) | ORAL | Status: DC
  Administered 2021-02-08 – 2021-02-11 (×3): 237 mL via ORAL
  Filled 2021-02-08: qty 237

## 2021-02-08 NOTE — Progress Notes (Signed)
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 Sammuel Hines 01/30/2021 Can be weightbearing on left leg-Staples removed 2 weeks out Cont tylenol for mild pain--Oxycodone 5 every 4 as needed pain management Patient to SNF when all medical issues improved Poorly functioning fistula IR to see and try to help with fistulogram 10/17 If not will need TDC to allow for HD ESRD TTS ?adjust EDW goals given hypotension?   given albumin during HD 10/15 Started this admit midodrine 5 mg tid New onset systolic heart failure Left bundle branch block /paroxysmal A. Fib with RVR EF this admission is 35-40% which is a reduction from prior Volume managed by HD currently euvolemic--OP ischemic eval? Flipped into rapid Afib 10/13 --paroxysmal--meds adjusted by Cards to metoprolol 12.5 QID as pressure allows--goal to transition to long acting Poor AC candidate  Leukocytosis NOS 2 view CXR shows only atelectasis Etiology of leukocytosis is unclear at this time Ileus postoperatively Pulled out NG--resolved 10/14 Diet graduated tolerating Advance to full solids today and is tolerating them BPH with urinary obstruction Flomax 0.4 Scalp laceration Continue mupirocin and foam dressing   DVT prophylaxis: Heparin Code Status: Full CODE STATUS Family Communication: called wife leatha Sestak [phone] and discussed with her Disposition:   Status is: Inpatient  Remains inpatient appropriate because:Hemodynamically unstable, Unsafe d/c plan, and IV treatments appropriate due to intensity of illness or inability to take PO  Dispo: The patient is from: Home              Anticipated d/c is to: SNF              Patient currently is not medically stable to d/c.   Difficult to place patient No  Consultants:  Orthopedics Renal Cardiology  Procedures: Hip repair 10/7  Antimicrobials:     Subjective:  Feels fair Sleepy today no cp fever No n/v   Objective: Vitals:   02/08/21 0544 02/08/21 0831 02/08/21 1223 02/08/21 1357  BP: (!) 119/40 (!) 122/45 (!) 111/36 (!) 117/39  Pulse: (!) 57 (!) 52 (!) 55 (!) 55  Resp: 16     Temp: 98.1 F (36.7 C)   97.8 F (36.6 C)  TempSrc: Oral   Oral  SpO2: 98%   100%  Weight:      Height:        Intake/Output Summary (Last 24 hours) at 02/08/2021 1533 Last data filed at 02/08/2021 1400 Gross per 24 hour  Intake 360 ml  Output --  Net 360 ml    Filed Weights   02/05/21 0902 02/07/21 0722 02/07/21 1108  Weight: 49.2 kg 48.2 kg 48 kg    Examination:  Alert slight sleepy--seen earlier was more awake EOMI NCAT no focal deficit Neck soft supple Chest clear no rales rhonchi S1 s 2no m--in sinus brady on tele Abdomen slightly distended -no reboudn no HSM Fistula intact--seems swollen again Overall improved -power grossly intact   Data Reviewed: personally reviewed   CBC    Component Value Date/Time  WBC 14.5 (H) 02/07/2021 0631   RBC 3.12 (L) 02/07/2021 0631   HGB 10.0 (L) 02/07/2021 0631   HGB 12.8 (L) 07/28/2016 1037   HCT 30.8 (L) 02/07/2021 0631   HCT 38.9 07/28/2016 1037   PLT 344 02/07/2021 0631   PLT 269 07/28/2016 1037   MCV 98.7 02/07/2021 0631   MCV 92.9 07/28/2016 1037   MCH 32.1 02/07/2021 0631   MCHC 32.5 02/07/2021 0631   RDW 13.3 02/07/2021 0631   RDW 13.7 07/28/2016 1037   LYMPHSABS 1.3 02/05/2021 0323   LYMPHSABS 1.8 07/28/2016 1037    MONOABS 1.7 (H) 02/05/2021 0323   MONOABS 1.2 (H) 07/28/2016 1037   EOSABS 0.2 02/05/2021 0323   EOSABS 0.1 07/28/2016 1037   BASOSABS 0.0 02/05/2021 0323   BASOSABS 0.1 07/28/2016 1037   CMP Latest Ref Rng & Units 02/08/2021 02/07/2021 02/06/2021  Glucose 70 - 99 mg/dL 110(H) 118(H) 126(H)  BUN 8 - 23 mg/dL 29(H) 46(H) 26(H)  Creatinine 0.61 - 1.24 mg/dL 3.62(H) 5.21(H) 3.74(H)  Sodium 135 - 145 mmol/L 136 136 137  Potassium 3.5 - 5.1 mmol/L 3.7 4.0 3.5  Chloride 98 - 111 mmol/L 97(L) 94(L) 99  CO2 22 - 32 mmol/L 28 30 27   Calcium 8.9 - 10.3 mg/dL 9.1 9.3 9.0  Total Protein 6.5 - 8.1 g/dL - - -  Total Bilirubin 0.3 - 1.2 mg/dL - - -  Alkaline Phos 38 - 126 U/L - - -  AST 15 - 41 U/L - - -  ALT 0 - 44 U/L - - -     Radiology Studies: No results found.   Scheduled Meds:  aspirin EC  81 mg Oral Daily   Chlorhexidine Gluconate Cloth  6 each Topical Q0600   feeding supplement (NEPRO CARB STEADY)  237 mL Oral TID BM   heparin  5,000 Units Subcutaneous Q8H   metoprolol tartrate  12.5 mg Oral QID   midodrine  5 mg Oral TID WC   multivitamin  1 tablet Oral QHS   mupirocin ointment   Topical BID   sevelamer carbonate  800 mg Oral TID WC   tamsulosin  0.4 mg Oral BID   Continuous Infusions:    LOS: 10 days   Time spent: 40  Nita Sells, MD [facility] To contact the attending provider between 7A-7P or the covering provider during after hours 7P-7A, please log into the web site [email or web address] and access using universal [place] password for that web site. If you do not have the password, please call the hospital operator.  02/08/2021, 3:33 PM

## 2021-02-08 NOTE — Progress Notes (Signed)
Referring Physician(s): Lin,J  Supervising Physician: Ruthann Cancer  Patient Status:  [facility] - In-pt  Chief Complaint:  Difficulty cannulating right arm dialysis fistula  Subjective: [name].  He has a history of end-stage renal disease with a right radiocephalic AV fistula that was placed in April of this year.  He also has an old left arm AV fistula.  Nephrology reports issues with cannulation through the right arm fistula and request now received for fistulogram for further evaluation.  Patient currently denies fever, headache, chest pain, dyspnea, cough, abdominal/back pain, nausea or bleeding.  He is tender around the RUE fistula region with associated hematoma and is hard of hearing.  Past medical history also significant for prostate cancer, GERD, left hip fracture with hemiarthroplasty on 01/30/2021, hypertension, anemia, secondary hyperparathyroidism, hypertension, MAT, scalp laceration after recent fall.  Past Medical History:  Diagnosis Date   Anemia    Arthritis    knees   Cancer [facility])    prostate   ED (erectile dysfunction)    GERD (gastroesophageal reflux disease)    ocassional    Hard of hearing    Hypertension    Hypertriglyceridemia    Renal failure    Dialysis dependent      Allergies: Patient has no known allergies.  Medications: Prior to Admission medications   Medication Sig Start Date End Date Taking? Authorizing Provider  acetaminophen (TYLENOL) 650 MG CR tablet Take 650 mg by mouth every 8 (eight) hours as needed for pain.   Yes [provider]  cetirizine (ZYRTEC) 10 MG tablet Take 10 mg by mouth daily.   Yes [provider]  Homeopathic Products (ARNICARE ARNICA) CREA Apply 1 application topically daily as needed (arthritis pain).   Yes [provider]  metoprolol succinate (TOPROL-XL) 50 MG 24 hr tablet Take 50 mg by mouth every evening. 05/27/14  Yes [provider]   tamsulosin (FLOMAX) 0.4 MG CAPS capsule Take 1 capsule (0.4 mg total) by mouth daily after supper. Patient taking differently: Take 0.4 mg by mouth in the morning and at bedtime. 10/09/19  Yes Patrecia Pour, MD  furosemide (LASIX) 40 MG tablet Take 1 tablet (40 mg total) by mouth daily as needed for fluid or edema (take after dinner nightly prn for edema or excess fluid). Patient not taking: Reported on 01/29/2021 04/22/20   Little Ishikawa, MD     Vital Signs: BP (!) 122/45   Pulse (!) 52   Temp 98.1 F (36.7 C) (Oral)   Resp 16   Ht 5\' 7"  (1.702 m)   Wt 105 lb 13.1 oz (48 kg)   SpO2 98%   BMI 16.57 kg/m   Physical Exam awake, alert.  Chest clear to auscultation bilaterally anteriorly.  Heart with slightly bradycardic but regular rhythm.  Abdomen soft, positive bowel sounds, nontender.  No significant lower extremity edema.  Right arm AV fistula with soft thrill/bruit  with associated edema/hematoma upper portion, slightly tender to palpation  Imaging: DG Chest 2 View  Result Date: 02/04/2021 CLINICAL DATA:  Pneumonia EXAM: CHEST - 2 VIEW COMPARISON:  02/01/2021 FINDINGS: Heart size upper normal. Negative for heart failure. Atherosclerotic calcification aortic arch. Improved aeration in the left lung base. Right lung remains clear. No pleural effusion. Calcified granuloma left upper lobe unchanged. IMPRESSION: Improving left lower lobe atelectasis. Electronically Signed   By: Franchot Gallo M.D.   On: 02/04/2021 14:53    Labs:  CBC: Recent Labs    02/03/21 0246 02/04/21 0133 02/05/21 0323 02/07/21 0631  WBC 13.8* 15.4* 14.7* 14.5*  HGB 10.4* 10.8* 10.2* 10.0*  HCT 31.4* 32.9* 30.8* 30.8*  PLT 265 250 272 344    COAGS: Recent Labs    01/29/21 0840  INR 1.0    BMP: Recent Labs    02/05/21 0323 02/06/21 0248 02/07/21 0230 02/08/21 0217  NA 136 137 136 136  K 4.3 3.5 4.0 3.7  CL 95* 99 94* 97*  CO2 27 27 30 28   GLUCOSE 104* 126* 118* 110*  BUN 58* 26*  46* 29*  CALCIUM 9.5 9.0 9.3 9.1  CREATININE 5.73* 3.74* 5.21* 3.62*  GFRNONAA 9* 15* 10* 16*    LIVER FUNCTION TESTS: Recent Labs    04/20/20 1819 04/22/20 0434 01/29/21 0840 01/30/21 0553 02/05/21 0323 02/06/21 0248 02/07/21 0230 02/08/21 0217  BILITOT 0.8 0.6 0.9  --   --   --   --   --   AST 19 23 25   --   --   --   --   --   ALT 13 17 15   --   --   --   --   --   ALKPHOS 88 75 113  --   --   --   --   --   PROT 7.0 5.7* 5.7*  --   --   --   --   --   ALBUMIN 3.6 2.8* 3.1*   < > 2.4* 2.5* 2.4* 2.5*   < > = values in this interval not displayed.    Assessment and Plan: [name].  He has a history of end-stage renal disease with a right radiocephalic AV fistula that was placed in April of this year.  He also has an old left arm AV fistula.  Nephrology reports issues with cannulation through the right arm fistula and request now received for fistulogram for further evaluation.  Patient currently denies fever, headache, chest pain, dyspnea, cough, abdominal/back pain, nausea or bleeding.  He is tender around the RUE fistula region with associated hematoma and is hard of hearing.  Past medical history also significant for prostate cancer, GERD, left hip fracture with hemiarthroplasty on 01/30/2021, hypertension, anemia, secondary hyperparathyroidism, hypertension, MAT, scalp laceration after recent fall.  Details/risks of procedure, including but not limited to, internal bleeding, infection, injury to adjacent structures, possible chest hemodialysis catheter placement discussed with patient and spouse with their understanding and consent.  Procedure tentatively planned for 10/17.   Electronically Signed: D. Rowe Robert, PA-C 02/08/2021, 10:21 AM   I spent a total of 25 minutes at the the patient's bedside AND on the patient's hospital floor or unit, greater than 50% of which was counseling/coordinating care for right arm hemodialysis  fistulagram with possible endovascular intervention or hemodialysis catheter placement   Patient ID: Jeffrey Payne, male   DOB: 03-Sep-1932, 87 y.o.   MRN: [id number]

## 2021-02-08 NOTE — Progress Notes (Signed)
[facility] Progress Note    Dialysis Orders: TTS at [facility] 4hr, 400/700, EDW 54.5kg (needs new EDW), 2K/2Ca, R AVF,  - heparin 4000 units IV TIW - No ESA, VDRA (fairly recent start)   Assessment/Plan: 1. L hip fracture s/p fall: s/pL hemiarthroplasty 01/30/2021 by ortho. 2. ESRD: Continue HD on TTS schedule; net UF 1.25L on 10/13.  Only 732 ml net UF  on HD 10/15 bec of hypotension and tachycardia end of treatment. VSS much better today.  Next HD Tues; no absolute indication for RRT and the patient appears to be  comfortable. Would HF only to ~48.5-49kg  Still issues with cannulation through rt Cimino and has fistulogram to evaluate tomorrow. Hopefully won't need a TC but with hematoma and short fistula may not be able to avoid.   3. HTN/volume: Mild hypoxia/pulm edem on admit-improved. Vol status acceptable today 4. Anemia: Hgb 11.1 on admit. Down to 9.7 10/08. Given Aranesp 25 mcg IV 01/31/21 Follow HGB. Transfuse if needed. hgb 10.2 10/13 5. Secondary hyperparathyroidism: CorrCa high, Not binders or VDRA. Use 2.0 Ca bath. phos restricted diet. Started renvela (P down to 4.1 on 10/14) 6. Nutrition: Alb low,push protein supplements 7. Hx urinary retention: has foley catheter. Per primary 8. Ileus: per primary 9. MAT: seen on admission, not in Afib.  SR with PACS at present. per primary. On BB.  10. Leukocytosis: Unclear, per primary. afebrile 11. Scalp laceration s/p fall 12. Access: hematoma around avf, seems to have good flow, keep arm elevated, stick away from the hematoma (I removed the bandages this AM and no bleeding)  Subjective: Some tenderness around the hematoma better, issues with cannulating again 10/15 with hypotension after hd + tachycardia. No complaints and much more alert today.   Objective Vitals:   02/07/21 1945 02/07/21 2123 02/08/21 0544 02/08/21 0831  BP: (!) 101/46  (!) 119/40 (!) 122/45  Pulse: (!) 57 70 (!) 57 (!) 52  Resp: 18  16    Temp: 97.9 F (36.6 C)  98.1 F (36.7 C)   TempSrc: Oral  Oral   SpO2: 99%  98%   Weight:      Height:       Physical Exam General: NAD Heart: S1,S2 RRR No M/R/G Lungs: CTAB Anteriorly. No WOB.  Abdomen: Soft, distended. Extremities: No LE edema. Dialysis Access: Rt Cimino + T/B Hematoma upper portion on AVF tender but not compromising flow.    Additional Objective Labs: Basic Metabolic Panel: Recent Labs  Lab 02/06/21 0248 02/07/21 0230 02/08/21 0217  NA 137 136 136  K 3.5 4.0 3.7  CL 99 94* 97*  CO2 27 30 28   GLUCOSE 126* 118* 110*  BUN 26* 46* 29*  CREATININE 3.74* 5.21* 3.62*  CALCIUM 9.0 9.3 9.1  PHOS 4.1 4.8* 4.0   Liver Function Tests: Recent Labs  Lab 02/06/21 0248 02/07/21 0230 02/08/21 0217  ALBUMIN 2.5* 2.4* 2.5*   No results for input(s): LIPASE, AMYLASE in the last 168 hours.  CBC: Recent Labs  Lab 02/02/21 0153 02/03/21 0246 02/04/21 0133 02/05/21 0323 02/07/21 0631  WBC 13.0* 13.8* 15.4* 14.7* 14.5*  NEUTROABS  --   --   --  11.4*  --   HGB 10.0* 10.4* 10.8* 10.2* 10.0*  HCT 29.9* 31.4* 32.9* 30.8* 30.8*  MCV 97.4 96.9 98.5 97.8 98.7  PLT 230 265 250 272 344   Blood Culture    Component Value Date/Time   SDES  04/21/2020 0328    BLOOD RIGHT  HAND Performed at [facility], [place] [address] [place], [place] [address]    SPECREQUEST  04/21/2020 0328    BOTTLES DRAWN AEROBIC ONLY Blood Culture adequate volume Performed at [place] [address], [place], [place] [address]    CULT  04/21/2020 0328    NO GROWTH 5 DAYS Performed at [facility], [place] [address] [place], [place] [address]    REPTSTATUS 04/26/2020 FINAL 04/21/2020 0328    Cardiac Enzymes: No results for input(s): CKTOTAL, CKMB, CKMBINDEX, TROPONINI in the last 168 hours. CBG: Recent Labs  Lab 02/06/21 1551  GLUCAP 132*   Iron Studies: No results for input(s): IRON, TIBC, TRANSFERRIN, FERRITIN in the last 72  hours. @lablastinr3 @ Studies/Results: No results found. Medications:    aspirin EC  81 mg Oral Daily   Chlorhexidine Gluconate Cloth  6 each Topical Q0600   feeding supplement  1 Container Oral TID BM   heparin  5,000 Units Subcutaneous Q8H   metoprolol tartrate  12.5 mg Oral QID   midodrine  5 mg Oral TID WC   multivitamin  1 tablet Oral QHS   mupirocin ointment   Topical BID   sevelamer carbonate  800 mg Oral TID WC   tamsulosin  0.4 mg Oral BID

## 2021-02-08 NOTE — Progress Notes (Signed)
Progress Note  Patient Name: Jeffrey Payne Date of Encounter: 02/08/2021  Primary Cardiologist: Minus Breeding, MD  Subjective   Blood pressure has improved with HD today.  Patient notes that he is tired but doing better.  Happy that his wife is at bedside.  Inpatient Medications    Scheduled Meds:  aspirin EC  81 mg Oral Daily   Chlorhexidine Gluconate Cloth  6 each Topical Q0600   feeding supplement (NEPRO CARB STEADY)  237 mL Oral TID BM   heparin  5,000 Units Subcutaneous Q8H   metoprolol tartrate  12.5 mg Oral QID   midodrine  5 mg Oral TID WC   multivitamin  1 tablet Oral QHS   mupirocin ointment   Topical BID   sevelamer carbonate  800 mg Oral TID WC   tamsulosin  0.4 mg Oral BID   Continuous Infusions:   PRN Meds: acetaminophen, metoprolol tartrate, oxyCODONE   Vital Signs    Vitals:   02/07/21 2123 02/08/21 0544 02/08/21 0831 02/08/21 1223  BP:  (!) 119/40 (!) 122/45 (!) 111/36  Pulse: 70 (!) 57 (!) 52 (!) 55  Resp:  16    Temp:  98.1 F (36.7 C)    TempSrc:  Oral    SpO2:  98%    Weight:      Height:        Intake/Output Summary (Last 24 hours) at 02/08/2021 1250 Last data filed at 02/07/2021 1500 Gross per 24 hour  Intake 0 ml  Output --  Net 0 ml   Last 3 Weights 02/07/2021 02/07/2021 02/05/2021  Weight (lbs) 105 lb 13.1 oz 106 lb 4.2 oz 108 lb 7.5 oz  Weight (kg) 48 kg 48.2 kg 49.2 kg     Telemetry    Sinus bradycardia to sinus rhythm, there is one run on P-SVT (potentially MAT) in last 24 hours Personally Reviewed  Physical Exam   GEN: No acute distress. Very frail appearing HEENT: Normocephalic, atraumatic, sclera non-icteric. Neck: No JVD Cardiac: regular bradycardia no murmurs, rubs, or gallops.  Respiratory: Clear to auscultation bilaterally tachypnea from 02/07/21 has resolved GI: Soft, nontender, non-distended, BS +x 4. MS: generally weak but move all extremities Extremities: No clubbing or cyanosis. No edema. Distal  pedal pulses are 2+, notable R arm thrill Neuro:  AAOx3. Follows commands. Psych:  Responds to questions appropriately, appears better rested from prior  Labs    High Sensitivity Troponin:   Recent Labs  Lab 02/05/21 1642 02/05/21 1830  TROPONINIHS 65* 72*      Cardiac EnzymesNo results for input(s): TROPONINI in the last 168 hours. No results for input(s): TROPIPOC in the last 168 hours.   Chemistry Recent Labs  Lab 02/06/21 0248 02/07/21 0230 02/08/21 0217  NA 137 136 136  K 3.5 4.0 3.7  CL 99 94* 97*  CO2 27 30 28   GLUCOSE 126* 118* 110*  BUN 26* 46* 29*  CREATININE 3.74* 5.21* 3.62*  CALCIUM 9.0 9.3 9.1  ALBUMIN 2.5* 2.4* 2.5*  GFRNONAA 15* 10* 16*  ANIONGAP 11 12 11      Hematology Recent Labs  Lab 02/04/21 0133 02/05/21 0323 02/07/21 0631  WBC 15.4* 14.7* 14.5*  RBC 3.34* 3.15* 3.12*  HGB 10.8* 10.2* 10.0*  HCT 32.9* 30.8* 30.8*  MCV 98.5 97.8 98.7  MCH 32.3 32.4 32.1  MCHC 32.8 33.1 32.5  RDW 13.5 13.5 13.3  PLT 250 272 344    BNPNo results for input(s): BNP, PROBNP in the last 168 hours.  DDimer No results for input(s): DDIMER in the last 168 hours.   Radiology    No results found.  Cardiac Studies   2D Echo 02/01/21   1. Left ventricular ejection fraction, by estimation, is 35 to 40%. Left  ventricular ejection fraction by 3D volume is 40 %. The left ventricle has  moderately decreased function. The left ventricle demonstrates global  hypokinesis. Left ventricular  diastolic parameters are consistent with Grade I diastolic dysfunction  (impaired relaxation). Elevated left atrial pressure.   2. Right ventricular systolic function is normal. The right ventricular  size is normal. Tricuspid regurgitation signal is inadequate for assessing  PA pressure.   3. Left atrial size was moderately dilated.   4. The mitral valve is normal in structure. Trivial mitral valve  regurgitation.   5. The aortic valve is tricuspid. There is mild  calcification of the  aortic valve. There is moderate thickening of the aortic valve. Aortic  valve regurgitation is moderate. Mild to moderate aortic valve  sclerosis/calcification is present, without any  evidence of aortic stenosis.   6. The inferior vena cava is normal in size with greater than 50%  respiratory variability, suggesting right atrial pressure of 3 mmHg.   Comparison(s): Prior images reviewed side by side. The left ventricular  function is worsened.   Patient Profile     87 y.o. male with mild diastolic dysfunction, ESRD on HD, GERD, HTN, HLD, anemia, arthritis, habitual ETOH intake, ED who presented to [facility] with fall and hip fracture s/p repair 01/30/21. During admission, also found to have new LBBB, LV dysfunction EF 35-40%, moderate AI. Post op he had issues with ileus tx with NGT but patient pulled it out. Other issues this admission include leukocytosis of unclear etiology, BPH, scalp laceration, and episodes of PAF on telemetry.  Assessment & Plan    1. Fall with hip fracture - per primary team  2. Acute combined CHF with LV dysfunction of unclear chronicity with new LBBB - suspected ischemic etiology- in prior discussion with family conservative inpatient plan was decided in the setting of hypotension; will not change this plan at this time. - no chest pain over weekend or documented prior - hypotension has precluded GDMT titration  - has minimal tolerated low dose BB (tartrate)  3. Paroxysmal atrial fibrillation vs MAT -  Through course patient has had evidence of PAF (see 02/05/21 ECG) as well as runs of regular SVT with p wave morphologies that are consistent with MAT - tolerating low dose metoprolol tartrate; peri-discharge would consolidate to succinate unless worsening hypotension with HD - agree with prior assessment, poor candidate for [facility]; discussed with wife and no plan for [facility] at this time  4. ESRD on HD - per nephrology  Discussed at length  with wife who has no further questions  For questions or updates, please contact [place] Please consult [email or web address] for contact info under Cardiology/STEMI.  Signed, Werner Lean, MD 02/08/2021, 12:50 PM

## 2021-02-08 NOTE — TOC Progression Note (Signed)
Transition of Care [facility]) - Progression Note    Patient Details  Name: Jeffrey Payne MRN: [id number] Date of Birth: 02/25/1933  Transition of Care [facility]) CM/SW [place], [place] Phone Number: 02/08/2021, 12:03 PM  Clinical Narrative:     Patient has SNF bed at [facility] place on 10/17. CSW followed up on patients insurance authorization on portal. CSW did not see where patients insurance authorization has been extended. CSW called patients insurance to follow up on authorization approval since additional clinicals were sent in. Patients insurance told CSW they will call back on insurance authorization determination. CSW awaiting callback.  Expected Discharge Plan: Skilled Nursing Facility Barriers to Discharge: Insurance Authorization  Expected Discharge Plan and Services Expected Discharge Plan: [place]   Discharge Planning Services: CM Consult Post Acute Care Choice: Durable Medical Equipment, Home Health Living arrangements for the past 2 months: Single Family Home                 DME Arranged: 3-N-1         HH Arranged: PT, OT HH Agency: [place] Date [place]: 02/04/21 Time [place]: [place] Representative spoke with at [place]: [place] ([place]) Interventions    Readmission Risk Interventions No flowsheet data found.

## 2021-02-09 ENCOUNTER — Inpatient Hospital Stay (HOSPITAL_COMMUNITY): Payer: Medicare Other

## 2021-02-09 DIAGNOSIS — S72002A Fracture of unspecified part of neck of left femur, initial encounter for closed fracture: Secondary | ICD-10-CM | POA: Diagnosis not present

## 2021-02-09 HISTORY — PX: IR AV DIALY SHUNT INTRO NEEDLE/INTRACATH INITIAL W/PTA/IMG RIGHT: IMG6116

## 2021-02-09 HISTORY — PX: IR US GUIDE VASC ACCESS RIGHT: IMG2390

## 2021-02-09 LAB — RENAL FUNCTION PANEL
Albumin: 2.6 g/dL — ABNORMAL LOW (ref 3.5–5.0)
Anion gap: 13 (ref 5–15)
BUN: 46 mg/dL — ABNORMAL HIGH (ref 8–23)
CO2: 27 mmol/L (ref 22–32)
Calcium: 9.4 mg/dL (ref 8.9–10.3)
Chloride: 92 mmol/L — ABNORMAL LOW (ref 98–111)
Creatinine, Ser: 5.05 mg/dL — ABNORMAL HIGH (ref 0.61–1.24)
GFR, Estimated: 10 mL/min — ABNORMAL LOW (ref >60)
Glucose, Bld: 106 mg/dL — ABNORMAL HIGH (ref 70–99)
Phosphorus: 4.9 mg/dL — ABNORMAL HIGH (ref 2.5–4.6)
Potassium: 4.1 mmol/L (ref 3.5–5.1)
Sodium: 132 mmol/L — ABNORMAL LOW (ref 135–145)

## 2021-02-09 LAB — CBC WITH DIFFERENTIAL/PLATELET
Abs Immature Granulocytes: 0.39 10*3/uL — ABNORMAL HIGH (ref 0.00–0.07)
Basophils Absolute: 0.1 10*3/uL (ref 0.0–0.1)
Basophils Relative: 1 %
Eosinophils Absolute: 0.5 10*3/uL (ref 0.0–0.5)
Eosinophils Relative: 4 %
HCT: 30.4 % — ABNORMAL LOW (ref 39.0–52.0)
Hemoglobin: 9.7 g/dL — ABNORMAL LOW (ref 13.0–17.0)
Immature Granulocytes: 3 %
Lymphocytes Relative: 15 %
Lymphs Abs: 1.9 10*3/uL (ref 0.7–4.0)
MCH: 31.7 pg (ref 26.0–34.0)
MCHC: 31.9 g/dL (ref 30.0–36.0)
MCV: 99.3 fL (ref 80.0–100.0)
Monocytes Absolute: 1.7 10*3/uL — ABNORMAL HIGH (ref 0.1–1.0)
Monocytes Relative: 13 %
Neutro Abs: 8.4 10*3/uL — ABNORMAL HIGH (ref 1.7–7.7)
Neutrophils Relative %: 64 %
Platelets: 347 10*3/uL (ref 150–400)
RBC: 3.06 MIL/uL — ABNORMAL LOW (ref 4.22–5.81)
RDW: 13.2 % (ref 11.5–15.5)
WBC: 13 10*3/uL — ABNORMAL HIGH (ref 4.0–10.5)
nRBC: 0 % (ref 0.0–0.2)

## 2021-02-09 MED ORDER — DARBEPOETIN ALFA 25 MCG/0.42ML IJ SOSY
25.0000 ug | PREFILLED_SYRINGE | INTRAMUSCULAR | Status: DC
  Filled 2021-02-09: qty 0.42

## 2021-02-09 MED ORDER — CHLORHEXIDINE GLUCONATE CLOTH 2 % EX PADS
6.0000 | MEDICATED_PAD | Freq: Every day | CUTANEOUS | Status: DC
  Administered 2021-02-10 – 2021-02-11 (×2): 6 via TOPICAL

## 2021-02-09 MED ORDER — IOHEXOL 300 MG/ML  SOLN
100.0000 mL | Freq: Once | INTRAMUSCULAR | Status: AC | PRN
  Administered 2021-02-09: 75 mL via INTRAVENOUS

## 2021-02-09 MED ORDER — IOHEXOL 300 MG/ML  SOLN: 100.0000 mL | Freq: Once | INTRAMUSCULAR | Status: DC | PRN

## 2021-02-09 MED ORDER — METOPROLOL TARTRATE 12.5 MG HALF TABLET
12.5000 mg | ORAL_TABLET | Freq: Three times a day (TID) | ORAL | Status: DC
  Administered 2021-02-09 – 2021-02-11 (×5): 12.5 mg via ORAL
  Filled 2021-02-09 (×5): qty 1

## 2021-02-09 MED ORDER — MIDAZOLAM HCL 2 MG/2ML IJ SOLN
INTRAMUSCULAR | Status: DC | PRN
  Administered 2021-02-09: .5 mg
  Administered 2021-02-09: .5 mg via INTRAVENOUS
  Administered 2021-02-11: .5 mg

## 2021-02-09 MED ORDER — FENTANYL CITRATE (PF) 100 MCG/2ML IJ SOLN
INTRAMUSCULAR | Status: DC | PRN
  Administered 2021-02-09: 25 ug
  Administered 2021-02-09: 25 ug via INTRAVENOUS
  Administered 2021-02-11: 12.5 ug

## 2021-02-09 MED ORDER — HEPARIN SODIUM (PORCINE) 1000 UNIT/ML IJ SOLN
INTRAMUSCULAR | Status: DC | PRN
  Administered 2021-02-09: 3000 [IU] via INTRAVENOUS

## 2021-02-09 MED ORDER — FENTANYL CITRATE (PF) 100 MCG/2ML IJ SOLN
INTRAMUSCULAR | Status: AC
  Filled 2021-02-09: qty 2

## 2021-02-09 MED ORDER — HEPARIN SODIUM (PORCINE) 1000 UNIT/ML IJ SOLN
INTRAMUSCULAR | Status: AC
  Filled 2021-02-09: qty 1

## 2021-02-09 MED ORDER — LIDOCAINE HCL 1 % IJ SOLN
INTRAMUSCULAR | Status: AC
  Administered 2021-02-09: 3 mL via SUBCUTANEOUS
  Filled 2021-02-09: qty 20

## 2021-02-09 MED ORDER — POLYETHYLENE GLYCOL 3350 17 G PO PACK
17.0000 g | PACK | Freq: Every day | ORAL | Status: DC
  Administered 2021-02-09 – 2021-02-10 (×2): 17 g via ORAL
  Filled 2021-02-09 (×2): qty 1

## 2021-02-09 MED ORDER — MIDAZOLAM HCL 2 MG/2ML IJ SOLN
INTRAMUSCULAR | Status: AC
  Filled 2021-02-09: qty 2

## 2021-02-09 NOTE — Progress Notes (Signed)
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 Sammuel Hines 01/30/2021 Can be weightbearing on left leg-Staples removed 2 weeks out Cont tylenol for mild pain--Oxycodone 5 every 4 as needed pain management Patient to SNF when all medical issues improved Poorly functioning fistula IR  fistulogram pending 10/17 If not will need TDC to allow for HD ESRD TTS ?adjust EDW goals given hypotension?   given albumin during HD 10/15 Started this admit midodrine 5 mg tid New onset systolic heart failure Left bundle branch block /paroxysmal A. Fib with RVR EF this admission is 35-40% which is a reduction from prior Volume managed by HD currently euvolemic--OP ischemic eval? Flipped into rapid Afib/MAT 10/13 --paroxysmal--meds adjusted today to metoprolol 12.5 tid this am Likely consolidate meds in the next 24 hours if HR remains stable Poor AC candidate  Leukocytosis NOS 2 view CXR shows only atelectasis Etiology of leukocytosis is unclear at this time Ileus postoperatively Pulled out NG--resolved 10/1 full solids today and is tolerating them BPH with urinary obstruction Flomax 0.4 Scalp laceration Continue mupirocin and foam dressing   DVT prophylaxis: Heparin Code Status: Full CODE STATUS Family Communication:  Disposition:  Status is: Inpatient  Remains inpatient appropriate because:Hemodynamically unstable, Unsafe d/c  plan, and IV treatments appropriate due to intensity of illness or inability to take PO  Dispo: The patient is from: Home              Anticipated d/c is to: SNF              Patient currently is not medically stable to d/c.   Difficult to place patient No  Consultants:  Orthopedics Renal Cardiology  Procedures: Hip repair 10/7  Antimicrobials:     Subjective:  Well no distress no fever no chills no n/v Somewhat tired today  Doesn't like how he feels post -HD  Objective: Vitals:   02/08/21 2200 02/09/21 0555 02/09/21 0859 02/09/21 0956  BP: (!) 106/23 (!) 100/30 (!) 96/23 (!) 126/43  Pulse:  60 (!) 56 61  Resp: 14 14 18    Temp: (!) 97.5 F (36.4 C) 97.8 F (36.6 C) 97.8 F (36.6 C)   TempSrc: Oral Oral    SpO2: 99% 100% 100%   Weight:      Height:        Intake/Output Summary (Last 24 hours) at 02/09/2021 0959 Last data filed at 02/08/2021 1400 Gross per 24 hour  Intake 360 ml  Output --  Net 360 ml    Filed Weights   02/05/21 0902 02/07/21 0722 02/07/21 1108  Weight: 49.2 kg 48.2 kg 48 kg    Examination:  Awake coherent nad no focal deficit wearing oxygen currently Cta b no added sound no rales rhonchi Abd soft nt nd no rebound no guard Fistula on RUE looks red and somewhat tense Rom intact  Data Reviewed: personally reviewed   CBC    Component Value Date/Time   WBC 13.0 (H) 02/09/2021 0332   RBC 3.06 (L) 02/09/2021 0630  HGB 9.7 (L) 02/09/2021 0332   HGB 12.8 (L) 07/28/2016 1037   HCT 30.4 (L) 02/09/2021 0332   HCT 38.9 07/28/2016 1037   PLT 347 02/09/2021 0332   PLT 269 07/28/2016 1037   MCV 99.3 02/09/2021 0332   MCV 92.9 07/28/2016 1037   MCH 31.7 02/09/2021 0332   MCHC 31.9 02/09/2021 0332   RDW 13.2 02/09/2021 0332   RDW 13.7 07/28/2016 1037   LYMPHSABS 1.9 02/09/2021 0332   LYMPHSABS 1.8 07/28/2016 1037   MONOABS 1.7 (H) 02/09/2021 0332   MONOABS 1.2 (H) 07/28/2016 1037   EOSABS 0.5 02/09/2021 0332   EOSABS 0.1 07/28/2016  1037   BASOSABS 0.1 02/09/2021 0332   BASOSABS 0.1 07/28/2016 1037   CMP Latest Ref Rng & Units 02/09/2021 02/08/2021 02/07/2021  Glucose 70 - 99 mg/dL 106(H) 110(H) 118(H)  BUN 8 - 23 mg/dL 46(H) 29(H) 46(H)  Creatinine 0.61 - 1.24 mg/dL 5.05(H) 3.62(H) 5.21(H)  Sodium 135 - 145 mmol/L 132(L) 136 136  Potassium 3.5 - 5.1 mmol/L 4.1 3.7 4.0  Chloride 98 - 111 mmol/L 92(L) 97(L) 94(L)  CO2 22 - 32 mmol/L 27 28 30   Calcium 8.9 - 10.3 mg/dL 9.4 9.1 9.3  Total Protein 6.5 - 8.1 g/dL - - -  Total Bilirubin 0.3 - 1.2 mg/dL - - -  Alkaline Phos 38 - 126 U/L - - -  AST 15 - 41 U/L - - -  ALT 0 - 44 U/L - - -     Radiology Studies: No results found.   Scheduled Meds:  aspirin EC  81 mg Oral Daily   Chlorhexidine Gluconate Cloth  6 each Topical Q0600   [START ON 02/10/2021] darbepoetin (ARANESP) injection - DIALYSIS  25 mcg Intravenous Q Tue-HD   feeding supplement (NEPRO CARB STEADY)  237 mL Oral TID BM   heparin  5,000 Units Subcutaneous Q8H   metoprolol tartrate  12.5 mg Oral TID   midodrine  5 mg Oral TID WC   multivitamin  1 tablet Oral QHS   mupirocin ointment   Topical BID   sevelamer carbonate  800 mg Oral TID WC   tamsulosin  0.4 mg Oral BID   Continuous Infusions:    LOS: 11 days   Time spent: 21  Nita Sells, MD [facility] To contact the attending provider between 7A-7P or the covering provider during after hours 7P-7A, please log into the web site [email or web address] and access using universal [place] password for that web site. If you do not have the password, please call the hospital operator.  02/09/2021, 9:59 AM

## 2021-02-09 NOTE — Progress Notes (Signed)
Physical Therapy Treatment Patient Details Name: Jeffrey Payne MRN: [id number] DOB: 11-Nov-1932 Today's Date: 02/09/2021   History of Present Illness Pt. is 87 yr old M admitted on 10/6 following fall resulting in L hip pain. Imaging (+) for impacted L fem neck fx. CT head/C-spine (-) for acute injury. Pt. underwent L partial hemiarthroplasy (ant/lat approach) on 10/7. Pt. diagnosed with ileus, placed on NG tube 10/11; pulled out NG tube overnight. PMH: anemia, arthritis, prostate CA, CKD, HOH, heart murmur, HTN, ESRD on HD    PT Comments    Pt admitted with above diagnosis. Pt was able to ambulate with min guard assist to min assist and incr distance. Pt needs cues for precautions as well as needed 2LO2 to keep sats >88%.  Will continue to follow for progression.  Pt currently with functional limitations due to balance and endurance deficits. Pt will benefit from skilled PT to increase their independence and safety with mobility to allow discharge to the venue listed below.      Recommendations for follow up therapy are one component of a multi-disciplinary discharge planning process, led by the attending physician.  Recommendations may be updated based on patient status, additional functional criteria and insurance authorization.  Follow Up Recommendations  SNF     Equipment Recommendations  Rolling walker with 5" wheels;3in1 (PT)    Recommendations for Other Services OT consult     Precautions / Restrictions Precautions Precautions: Anterior Hip Precaution Booklet Issued: Yes (comment) Precaution Comments: Precaution sheet in room Restrictions LLE Weight Bearing: Weight bearing as tolerated     Mobility  Bed Mobility Overal bed mobility: Needs Assistance Bed Mobility: Sit to Supine;Supine to Sit     Supine to sit: Min assist;Min guard Sit to supine: Min guard   General bed mobility comments: Requires min guard A to transfer to EOB with pt keeping LEs together well with  cues. .  VCs to place L UE behind pelvis to push pelvis forward, able to complete with cueing only.Able to lie back downwithout assist. Follows precautions with cues.    Transfers Overall transfer level: Needs assistance Equipment used: Rolling walker (2 wheeled) Transfers: Sit to/from Stand Sit to Stand: Min assist Stand pivot transfers: Min assist       General transfer comment: Pt. completes sit > stand with min A, continues to require VCs for safety with hand placement as pt. attempts to pull up on RW. Needs steadying assist once up as well.  Ambulation/Gait Ambulation/Gait assistance: Min assist;Min guard Gait Distance (Feet): 30 Feet Assistive device: Rolling walker (2 wheeled) Gait Pattern/deviations: Step-to pattern;Decreased step length - right;Decreased step length - left;Trunk flexed;Leaning posteriorly;Shuffle   Gait velocity interpretation: <1.31 ft/sec, indicative of household ambulator General Gait Details: Pt. amb from 1 side of bed around the bed and back to the other side of the bed as nurse wanted pt back to bed at end of treatment.   Requires inc VCs for appropriate gait sequencing, demos improved carryover with practice.  Pt. demos forward flexed posture with slight posterior lean during amb and beenfits from VC/TCs to correct and improve balance.  Pt. fatigues quickly with amb.Pt was on 2LO2 on arrival.  removed O2 and sats down to 86% therefore replaced at 2L and sats >88% with 2L.   Stairs             Wheelchair Mobility    Modified Rankin (Stroke Patients Only)       Balance Overall balance assessment: Needs assistance Sitting-balance  support: No upper extremity supported;Feet supported Sitting balance-Leahy Scale: Fair     Standing balance support: During functional activity;Bilateral upper extremity supported Standing balance-Leahy Scale: Poor Standing balance comment: relies on UE support as well as external support.                             Cognition Arousal/Alertness: Awake/alert Behavior During Therapy: WFL for tasks assessed/performed Overall Cognitive Status: Within Functional Limits for tasks assessed                                        Exercises Total Joint Exercises Ankle Circles/Pumps: AROM;Both;20 reps Quad Sets: AROM;Both;10 reps;Supine Short Arc Quad: AAROM;Left;20 reps;Supine Heel Slides: Left;AAROM;20 reps;Supine Long Arc Quad: Left;20 reps;AROM;Seated    General Comments General comments (skin integrity, edema, etc.): Continue education for precautions.  Also educated wife and pt regarding exercises and gave handout.      Pertinent Vitals/Pain Pain Assessment: No/denies pain    Home Living                      Prior Function            PT Goals (current goals can now be found in the care plan section) Acute Rehab PT Goals Patient Stated Goal: SNF, return home Progress towards PT goals: Progressing toward goals    Frequency    Min 3X/week      PT Plan Current plan remains appropriate    Co-evaluation              AM-PAC PT "6 Clicks" Mobility   Outcome Measure  Help needed turning from your back to your side while in a flat bed without using bedrails?: A Little Help needed moving from lying on your back to sitting on the side of a flat bed without using bedrails?: A Little Help needed moving to and from a bed to a chair (including a wheelchair)?: A Little Help needed standing up from a chair using your arms (e.g., wheelchair or bedside chair)?: A Little Help needed to walk in hospital room?: A Little Help needed climbing 3-5 steps with a railing? : A Lot 6 Click Score: 17    End of Session Equipment Utilized During Treatment: Gait belt Activity Tolerance: Patient tolerated treatment well;Patient limited by fatigue Patient left: with family/visitor present;in bed;with call bell/phone within reach;with bed alarm set Nurse  Communication: Mobility status PT Visit Diagnosis: History of falling (Z91.81);Other abnormalities of gait and mobility (R26.89);Difficulty in walking, not elsewhere classified (R26.2);Pain Pain - Right/Left: Left Pain - part of body: Hip     Time: 1231-1300 PT Time Calculation (min) (ACUTE ONLY): 29 min  Charges:  $Gait Training: 8-22 mins $Therapeutic Exercise: 8-22 mins                     Akbar Sacra M,PT Acute Rehab Services [phone] (pager)    [name] 02/09/2021, 1:53 PM

## 2021-02-09 NOTE — Procedures (Signed)
Interventional Radiology Procedure Note  Hx: No duplex available Given history of "high arterial pressures" on serial HD attempts, with clots on the last in the cannula.   Procedure:   US guided access right BC fistula x 2 Fistulagram Angioplasty of proximal venous outflow 57mm + 10mm.  Findings: Venous out flow of the upper arm and central without stenosis.  Proximal outflow stenosis treated with 45mm, to no residual.   Excellent thrill on completion.    Complications: None  Recommendations:  - OK to use - If there continues to be problems with fistula, recommend duplex evaluation - stay sutures may be removed at HD session in near future - Do not submerge for 7 days - Routine wound care   Signed,  Jeffrey Payne. Jeffrey Newport, DO

## 2021-02-09 NOTE — Progress Notes (Signed)
[facility] Progress Note   Subjective:   Pt seen in room. Reports he is feeling well, just tired. Pain is well controlled. Denies SOB, CP, and dizziness.   Objective Vitals:   02/08/21 2200 02/09/21 0555 02/09/21 0859 02/09/21 0956  BP: (!) 106/23 (!) 100/30 (!) 96/23 (!) 126/43  Pulse:  60 (!) 56 61  Resp: 14 14 18    Temp: (!) 97.5 F (36.4 C) 97.8 F (36.6 C) 97.8 F (36.6 C)   TempSrc: Oral Oral    SpO2: 99% 100% 100%   Weight:      Height:       Physical Exam General: WDWN elderly male in NAD Heart: RRR, no murmur, rubs or gallops Lungs: CTA bilaterally   Abdomen: Soft, non-distended, +BS Extremities: No edema b/l lower extremities Dialysis Access:  RUE AVF with + hematoma over proximal portion  Additional Objective Labs: Basic Metabolic Panel: Recent Labs  Lab 02/07/21 0230 02/08/21 0217 02/09/21 0332  NA 136 136 132*  K 4.0 3.7 4.1  CL 94* 97* 92*  CO2 30 28 27   GLUCOSE 118* 110* 106*  BUN 46* 29* 46*  CREATININE 5.21* 3.62* 5.05*  CALCIUM 9.3 9.1 9.4  PHOS 4.8* 4.0 4.9*   Liver Function Tests: Recent Labs  Lab 02/07/21 0230 02/08/21 0217 02/09/21 0332  ALBUMIN 2.4* 2.5* 2.6*   No results for input(s): LIPASE, AMYLASE in the last 168 hours. CBC: Recent Labs  Lab 02/03/21 0246 02/04/21 0133 02/05/21 0323 02/07/21 0631 02/09/21 0332  WBC 13.8* 15.4* 14.7* 14.5* 13.0*  NEUTROABS  --   --  11.4*  --  8.4*  HGB 10.4* 10.8* 10.2* 10.0* 9.7*  HCT 31.4* 32.9* 30.8* 30.8* 30.4*  MCV 96.9 98.5 97.8 98.7 99.3  PLT 265 250 272 344 347   Blood Culture    Component Value Date/Time   SDES  04/21/2020 0328    BLOOD RIGHT HAND Performed at [facility], [place] [address], [place], [place] [address]    SPECREQUEST  04/21/2020 0328    BOTTLES DRAWN AEROBIC ONLY Blood Culture adequate volume Performed at [facility] [address] [place], [place] [address]    CULT  04/21/2020 0328    NO GROWTH  5 DAYS Performed at [facility], [place] [address] [place], [place] [address]    REPTSTATUS 04/26/2020 FINAL 04/21/2020 0328    Cardiac Enzymes: No results for input(s): CKTOTAL, CKMB, CKMBINDEX, TROPONINI in the last 168 hours. CBG: Recent Labs  Lab 02/06/21 1551  GLUCAP 132*   Iron Studies: No results for input(s): IRON, TIBC, TRANSFERRIN, FERRITIN in the last 72 hours. @lablastinr3 @ Studies/Results: No results found. Medications:   aspirin EC  81 mg Oral Daily   Chlorhexidine Gluconate Cloth  6 each Topical Q0600   feeding supplement (NEPRO CARB STEADY)  237 mL Oral TID BM   heparin  5,000 Units Subcutaneous Q8H   metoprolol tartrate  12.5 mg Oral TID   midodrine  5 mg Oral TID WC   multivitamin  1 tablet Oral QHS   mupirocin ointment   Topical BID   sevelamer carbonate  800 mg Oral TID WC   tamsulosin  0.4 mg Oral BID    Dialysis Orders: TTS at [facility] 4hr, 400/700, EDW 54.5kg (needs new EDW), 2K/2Ca, R AVF,  - heparin 4000 units IV TIW - No ESA, VDRA (fairly recent start)  Assessment/Plan: 1. L hip fracture s/p fall: s/pL hemiarthroplasty 01/30/2021 by ortho. 2. ESRD:  Continue HD on TTS schedule; having some issues with hypotension during HD but improving. Next HD tomorrow, he is well below his EDW and volume status is stable, minimal UF with HD 3. Vascular access issues: Still issues with cannulation through rt [place] and has fistulogram to evaluate today. Hopefully won't need a TC but with hematoma and short fistula may not be able to avoid. 4. HTN/volume: Mild hypoxia/pulm edem on admit-improved. Appears euvolemic today.  5. Anemia: Hgb 11.1 on admit. Down to 9.7  today 10/08. Given Aranesp 25 mcg IV 01/31/21, will give another dose with next HD.  6. Secondary hyperparathyroidism: CorrCa high, Not binders or VDRA. Use 2.0 Ca bath. phos restricted diet. Started renvela (P down to 4.1 on 10/14) 7. Nutrition: Alb low,push protein supplements 8. Hx urinary retention:  has foley catheter. Per primary  Anice Paganini, PA-C 02/09/2021, 11:08 AM  [facility] Pager: [phone]

## 2021-02-10 DIAGNOSIS — I480 Paroxysmal atrial fibrillation: Secondary | ICD-10-CM | POA: Diagnosis not present

## 2021-02-10 DIAGNOSIS — S72002A Fracture of unspecified part of neck of left femur, initial encounter for closed fracture: Secondary | ICD-10-CM | POA: Diagnosis not present

## 2021-02-10 DIAGNOSIS — I5043 Acute on chronic combined systolic (congestive) and diastolic (congestive) heart failure: Secondary | ICD-10-CM | POA: Diagnosis not present

## 2021-02-10 LAB — RENAL FUNCTION PANEL
Albumin: 2.6 g/dL — ABNORMAL LOW (ref 3.5–5.0)
Albumin: 2.6 g/dL — ABNORMAL LOW (ref 3.5–5.0)
Anion gap: 11 (ref 5–15)
Anion gap: 14 (ref 5–15)
BUN: 59 mg/dL — ABNORMAL HIGH (ref 8–23)
BUN: 60 mg/dL — ABNORMAL HIGH (ref 8–23)
CO2: 26 mmol/L (ref 22–32)
CO2: 26 mmol/L (ref 22–32)
Calcium: 9.4 mg/dL (ref 8.9–10.3)
Calcium: 9.4 mg/dL (ref 8.9–10.3)
Chloride: 94 mmol/L — ABNORMAL LOW (ref 98–111)
Chloride: 94 mmol/L — ABNORMAL LOW (ref 98–111)
Creatinine, Ser: 6.15 mg/dL — ABNORMAL HIGH (ref 0.61–1.24)
Creatinine, Ser: 6.5 mg/dL — ABNORMAL HIGH (ref 0.61–1.24)
GFR, Estimated: 8 mL/min — ABNORMAL LOW (ref >60)
GFR, Estimated: 8 mL/min — ABNORMAL LOW (ref >60)
Glucose, Bld: 115 mg/dL — ABNORMAL HIGH (ref 70–99)
Glucose, Bld: 101 mg/dL — ABNORMAL HIGH (ref 70–99)
Phosphorus: 5.5 mg/dL — ABNORMAL HIGH (ref 2.5–4.6)
Phosphorus: 5.4 mg/dL — ABNORMAL HIGH (ref 2.5–4.6)
Potassium: 4.3 mmol/L (ref 3.5–5.1)
Potassium: 4.4 mmol/L (ref 3.5–5.1)
Sodium: 131 mmol/L — ABNORMAL LOW (ref 135–145)
Sodium: 134 mmol/L — ABNORMAL LOW (ref 135–145)

## 2021-02-10 LAB — CBC
HCT: 28.5 % — ABNORMAL LOW (ref 39.0–52.0)
Hemoglobin: 9.3 g/dL — ABNORMAL LOW (ref 13.0–17.0)
MCH: 32.2 pg (ref 26.0–34.0)
MCHC: 32.6 g/dL (ref 30.0–36.0)
MCV: 98.6 fL (ref 80.0–100.0)
Platelets: 409 10*3/uL — ABNORMAL HIGH (ref 150–400)
RBC: 2.89 MIL/uL — ABNORMAL LOW (ref 4.22–5.81)
RDW: 13.3 % (ref 11.5–15.5)
WBC: 20.5 10*3/uL — ABNORMAL HIGH (ref 4.0–10.5)
nRBC: 0 % (ref 0.0–0.2)

## 2021-02-10 LAB — RESP PANEL BY RT-PCR (FLU A&B, COVID) ARPGX2
Influenza A by PCR: NEGATIVE
Influenza B by PCR: NEGATIVE
SARS Coronavirus 2 by RT PCR: NEGATIVE

## 2021-02-10 MED ORDER — OXYCODONE HCL 5 MG PO TABS: 5.0000 mg | ORAL_TABLET | ORAL | 0 refills | Status: DC | PRN

## 2021-02-10 MED ORDER — MIDODRINE HCL 5 MG PO TABS: 5.0000 mg | ORAL_TABLET | Freq: Three times a day (TID) | ORAL | Status: DC

## 2021-02-10 MED ORDER — SEVELAMER CARBONATE 800 MG PO TABS: 800.0000 mg | ORAL_TABLET | Freq: Three times a day (TID) | ORAL | Status: DC

## 2021-02-10 MED ORDER — DARBEPOETIN ALFA 25 MCG/0.42ML IJ SOSY: 25.0000 ug | PREFILLED_SYRINGE | INTRAMUSCULAR | Status: DC

## 2021-02-10 MED ORDER — HEPARIN SODIUM (PORCINE) 5000 UNIT/ML IJ SOLN: 5000.0000 [IU] | Freq: Three times a day (TID) | INTRAMUSCULAR | Status: DC

## 2021-02-10 MED ORDER — METOPROLOL TARTRATE 25 MG PO TABS: 12.5000 mg | ORAL_TABLET | Freq: Three times a day (TID) | ORAL | Status: DC

## 2021-02-10 MED ORDER — CHLORHEXIDINE GLUCONATE CLOTH 2 % EX PADS: 6.0000 | MEDICATED_PAD | Freq: Every day | CUTANEOUS | Status: DC

## 2021-02-10 NOTE — Consult Note (Signed)
Chief Complaint: Patient was seen in consultation today for tunneled dialysis catheter placement Chief Complaint  Patient presents with   Fall   at the request of Dr Mickel Crow   Supervising Physician: Markus Daft  Patient Status: [facility] - In-pt  History of Present Illness: Jeffrey Payne is a 87 y.o. male   ESRD High arterial pressure and clot reported to IR yesterday  Fistuogram:  IMPRESSION: Status post ultrasound-guided access of right upper extremity radiocephalic fistula at 2 sites with balloon angioplasty of the arteriovenous anastomosis to 6 mm, and the proximal venous outflow to a 8 mm. ACCESS: If there is further problems with the fistula, recommend duplex, as the given history of high arterial pressures does not correlate with the findings of the fistulagram, as there was no downstream venous outflow stenosis perceived, only a venous outflow stenosis just beyond the AV anastomosis  To dialysis today-- unable to cannulate per [facility] note this am. Requesting tunneled dialysis catheter placement Plan for same in IR 02/11/21   Past Medical History:  Diagnosis Date   Anemia    Arthritis    knees   Cancer [facility])    prostate   ED (erectile dysfunction)    GERD (gastroesophageal reflux disease)    ocassional    Hard of hearing    Hypertension    Hypertriglyceridemia    Renal failure    Dialysis dependent    Past Surgical History:  Procedure Laterality Date   A/V FISTULAGRAM N/A 07/25/2020   Procedure: A/V FISTULAGRAM - Left Arm;  Surgeon: Elam Dutch, MD;  Location: [place] [facility];  Service: Cardiovascular;  Laterality: N/A;   AV FISTULA PLACEMENT Left 03/04/2020   Procedure: BRACHIOCEPHALIC ARTERIOVENOUS (AV) FISTULA CREATION LEFT;  Surgeon: Elam Dutch, MD;  Location: [facility] OR;  Service: Vascular;  Laterality: Left;   AV FISTULA PLACEMENT Right 07/28/2020   Procedure: RIGHT  ARM ARTERIOVENOUS FISTULA CREATION;  Surgeon: Elam Dutch,  MD;  Location: [place];  Service: Vascular;  Laterality: Right;   COLONOSCOPY     HIP ARTHROPLASTY Left 01/30/2021   Procedure: ARTHROPLASTY BIPOLAR HIP (HEMIARTHROPLASTY);  Surgeon: Vanetta Mulders, MD;  Location: [place];  Service: Orthopedics;  Laterality: Left;   IR AV DIALY SHUNT INTRO NEEDLE/INTRACATH INITIAL W/PTA/IMG RIGHT Right 02/09/2021   IR US GUIDE VASC ACCESS RIGHT  02/09/2021   renal cyst biopsy     TONSILLECTOMY      Allergies: Patient has no known allergies.  Medications: Prior to Admission medications   Medication Sig Start Date End Date Taking? Authorizing Provider  acetaminophen (TYLENOL) 650 MG CR tablet Take 650 mg by mouth every 8 (eight) hours as needed for pain.   Yes [provider]  cetirizine (ZYRTEC) 10 MG tablet Take 10 mg by mouth daily.   Yes [provider]  Homeopathic Products (ARNICARE ARNICA) CREA Apply 1 application topically daily as needed (arthritis pain).   Yes [provider]  metoprolol succinate (TOPROL-XL) 50 MG 24 hr tablet Take 50 mg by mouth every evening. 05/27/14  Yes [provider]  tamsulosin (FLOMAX) 0.4 MG CAPS capsule Take 1 capsule (0.4 mg total) by mouth daily after supper. Patient taking differently: Take 0.4 mg by mouth in the morning and at bedtime. 10/09/19  Yes Patrecia Pour, MD  furosemide (LASIX) 40 MG tablet Take 1 tablet (40 mg total) by mouth daily as needed for fluid or edema (take after dinner nightly prn for edema or excess fluid).  Patient not taking: Reported on 01/29/2021 04/22/20   Little Ishikawa, MD     Family History  Problem Relation Age of Onset   Cancer Mother     Social History   Socioeconomic History   Marital status: Married    Spouse name: Not on file   Number of children: Not on file   Years of education: Not on file   Highest education level: Not on file  Occupational History   Not on file  Tobacco Use   Smoking status: Never   Smokeless tobacco: Never   Vaping Use   Vaping Use: Never used  Substance and Sexual Activity   Alcohol use: Yes    Alcohol/week: 14.0 standard drinks    Types: 14 Standard drinks or equivalent per week    Comment: 2 cocktails per day   Drug use: No   Sexual activity: Not on file  Other Topics Concern   Not on file  Social History Narrative   Not on file   Social Determinants of Health   Financial Resource Strain: Not on file  Food Insecurity: Not on file  Transportation Needs: Not on file  Physical Activity: Not on file  Stress: Not on file  Social Connections: Not on file     Review of Systems: A 12 point ROS discussed and pertinent positives are indicated in the HPI above.  All other systems are negative.  Vital Signs: BP (!) 124/45 (BP Location: Left Arm)   Pulse 75   Temp 98 F (36.7 C) (Oral)   Resp 17   Ht 5\' 7"  (1.702 m)   Wt 111 lb 1.8 oz (50.4 kg)   SpO2 99%   BMI 17.40 kg/m   Physical Exam Vitals reviewed.  HENT:     Mouth/Throat:     Mouth: Mucous membranes are moist.  Cardiovascular:     Rate and Rhythm: Normal rate and regular rhythm.     Heart sounds: Normal heart sounds.  Pulmonary:     Breath sounds: Normal breath sounds.  Abdominal:     Palpations: Abdomen is soft.  Musculoskeletal:        General: Normal range of motion.     Comments: Right arm dialysis fistula Aneurysmal  Ecchymotic + thrill; no pulse   Neurological:     Mental Status: He is alert and oriented to person, place, and time.  Psychiatric:        Behavior: Behavior normal.    Imaging: CT ABDOMEN PELVIS WO CONTRAST  Result Date: 02/02/2021 CLINICAL DATA:  Abdominal pain, distension EXAM: CT ABDOMEN AND PELVIS WITHOUT CONTRAST TECHNIQUE: Multidetector CT imaging of the abdomen and pelvis was performed following the standard protocol without IV contrast. COMPARISON:  01/31/2021, 04/20/2020 FINDINGS: Lower chest: The heart is enlarged, with trace pericardial effusion. There are small bilateral  pleural effusions, with minimal dependent lower lobe atelectasis. Hepatobiliary: No focal liver abnormality is seen. No gallstones, gallbladder wall thickening, or biliary dilatation. Pancreas: Unremarkable. No pancreatic ductal dilatation or surrounding inflammatory changes. Spleen: Normal in size without focal abnormality. Adrenals/Urinary Tract: Interval drainage or rupture of the large cyst off the lower pole left kidney. There is bilateral renal cortical atrophy unchanged. No urinary tract calculi or obstructive uropathy. Evaluation of the bladder is limited by streak artifact from left hip arthroplasty. The adrenals are unremarkable. Stomach/Bowel: There is diffuse gaseous distension of the stomach, small bowel, and colon. No evidence of obstruction. Findings could reflect ileus. There is scattered diverticulosis throughout the colon  without evidence of acute diverticulitis. The appendix, if still present, is not well visualized. Vascular/Lymphatic: Aortic atherosclerosis. No enlarged abdominal or pelvic lymph nodes. Reproductive: Prostate is enlarged, evaluation limited by left hip arthroplasty. Other: No free fluid or free intraperitoneal gas. No abdominal wall hernia. Musculoskeletal: Unremarkable left hip hemiarthroplasty. No acute or destructive bony lesions. Reconstructed images demonstrate no additional findings. IMPRESSION: 1. Diffuse gaseous distention of the bowel consistent with ileus. 2. Small bilateral pleural effusions. 3. Cardiomegaly with trace pericardial effusion. 4. Diffuse colonic diverticulosis without diverticulitis. 5.  Aortic Atherosclerosis (ICD10-I70.0). Electronically Signed   By: Randa Ngo M.D.   On: 02/02/2021 16:13   DG Chest 1 View  Result Date: 01/29/2021 CLINICAL DATA:  87 year old male status post fall last night. EXAM: CHEST  1 VIEW COMPARISON:  Chest radiographs 04/21/2020 and earlier. FINDINGS: Portable AP supine views at 0903 hours. Chronic cardiomegaly appears  stable. Calcified aortic atherosclerosis. Other mediastinal contours are within normal limits. Visualized tracheal air column is within normal limits. Increased pulmonary vascularity, slightly greater on the right. Trace fluid in the right minor fissure now, but no other pleural effusion. No pneumothorax or confluent pulmonary opacity. No acute osseous abnormality identified. IMPRESSION: 1. Chronic cardiomegaly with increased pulmonary vascular congestion. Consider mild or developing interstitial edema. 2. No other acute cardiopulmonary abnormality. Electronically Signed   By: Genevie Ann M.D.   On: 01/29/2021 09:52   DG Chest 2 View  Result Date: 02/04/2021 CLINICAL DATA:  Pneumonia EXAM: CHEST - 2 VIEW COMPARISON:  02/01/2021 FINDINGS: Heart size upper normal. Negative for heart failure. Atherosclerotic calcification aortic arch. Improved aeration in the left lung base. Right lung remains clear. No pleural effusion. Calcified granuloma left upper lobe unchanged. IMPRESSION: Improving left lower lobe atelectasis. Electronically Signed   By: Franchot Gallo M.D.   On: 02/04/2021 14:53   CT HEAD WO CONTRAST (5MM)  Result Date: 01/29/2021 CLINICAL DATA:  Fall today. EXAM: CT HEAD WITHOUT CONTRAST CT CERVICAL SPINE WITHOUT CONTRAST TECHNIQUE: Multidetector CT imaging of the head and cervical spine was performed following the standard protocol without intravenous contrast. Multiplanar CT image reconstructions of the cervical spine were also generated. COMPARISON:  None. FINDINGS: CT HEAD FINDINGS Brain: Mild diffuse cortical atrophy is noted. No mass effect or midline shift is noted. Ventricular size is within normal limits. There is no evidence of mass lesion, hemorrhage or acute infarction. Vascular: No hyperdense vessel or unexpected calcification. Skull: Normal. Negative for fracture or focal lesion. Sinuses/Orbits: No acute finding. Other: None. CT CERVICAL SPINE FINDINGS Alignment: Mild grade 1 anterolisthesis  of C4-5 is noted secondary to posterior facet joint hypertrophy. Skull base and vertebrae: No acute fracture. No primary bone lesion or focal pathologic process. Soft tissues and spinal canal: No prevertebral fluid or swelling. No visible canal hematoma. Disc levels: Moderate degenerative disc disease is noted at C5-6 and C6-7 and C7-T1. Upper chest: Negative. Other: None. IMPRESSION: No acute intracranial abnormality seen. Moderate multilevel degenerative disc disease. No acute abnormality seen in the cervical spine. Electronically Signed   By: Marijo Conception M.D.   On: 01/29/2021 10:38   CT Cervical Spine Wo Contrast  Result Date: 01/29/2021 CLINICAL DATA:  Fall today. EXAM: CT HEAD WITHOUT CONTRAST CT CERVICAL SPINE WITHOUT CONTRAST TECHNIQUE: Multidetector CT imaging of the head and cervical spine was performed following the standard protocol without intravenous contrast. Multiplanar CT image reconstructions of the cervical spine were also generated. COMPARISON:  None. FINDINGS: CT HEAD FINDINGS Brain: Mild diffuse cortical  atrophy is noted. No mass effect or midline shift is noted. Ventricular size is within normal limits. There is no evidence of mass lesion, hemorrhage or acute infarction. Vascular: No hyperdense vessel or unexpected calcification. Skull: Normal. Negative for fracture or focal lesion. Sinuses/Orbits: No acute finding. Other: None. CT CERVICAL SPINE FINDINGS Alignment: Mild grade 1 anterolisthesis of C4-5 is noted secondary to posterior facet joint hypertrophy. Skull base and vertebrae: No acute fracture. No primary bone lesion or focal pathologic process. Soft tissues and spinal canal: No prevertebral fluid or swelling. No visible canal hematoma. Disc levels: Moderate degenerative disc disease is noted at C5-6 and C6-7 and C7-T1. Upper chest: Negative. Other: None. IMPRESSION: No acute intracranial abnormality seen. Moderate multilevel degenerative disc disease. No acute abnormality seen  in the cervical spine. Electronically Signed   By: Marijo Conception M.D.   On: 01/29/2021 10:38   IR US Guide Vasc Access Right  Result Date: 02/09/2021 INDICATION: 87 year old male with reported history of high arterial pressure and clot within the cannula at serial hemodialysis session. EXAM: ULTRASOUND-GUIDED ACCESS X2 RIGHT UPPER EXTREMITY BC FISTULA FISTULAGRAM BALLOON ANGIOPLASTY OF THE PROXIMAL VENOUS OUTFLOW MEDICATIONS: 3000 UNITS HEPARIN 1 MG VERSED, 50 MCG FENTANYL ANESTHESIA/SEDATION: Moderate Sedation Time:  46 minutes The patient was continuously monitored during the procedure by the interventional radiology nurse under my direct supervision. FLUOROSCOPY TIME:  Fluoroscopy Time: 4 minutes 6 seconds (9 mGy). COMPLICATIONS: None PROCEDURE: Informed written consent was obtained from the patient after a thorough discussion of the procedural risks, benefits and alternatives. All questions were addressed. Maximal Sterile Barrier Technique was utilized including caps, mask, sterile gowns, sterile gloves, sterile drape, hand hygiene and skin antiseptic. A timeout was performed prior to the initiation of the procedure. Ultrasound was performed of the right upper extremity. 1% lidocaine was used for local anesthesia. Ultrasound guidance was used for micro puncture access directed centrally. Angiogram was performed of the venous outflow. Reflux images were then performed. Given the appearance of the proximal venous outflow, we planned angioplasty of the venous outflow. Ultrasound-guided access was then required directed peripherally towards the arteriovenous anastomosis. 1% lidocaine was used for local anesthesia. Micropuncture access was achieved and a microwire was placed. A Bentson wire was passed into the venous access. A 7 French sheath was then placed over the Bentson wire. Kumpe the catheter was navigated with the use of a Glidewire into the arterial side of the arteriovenous anastomosis for repeat  imaging. Balloon angioplasty was then performed of the arteriovenous anastomosis 6 mm, and then of the proximal venous outflow up the 8 mm. Repeat fistulagram was performed demonstrating resolution of the stenosis in the proximal venous outflow with 0% residual. All catheters wires and sheaths were removed with placement of 2 stay sutures. External and thrill was palpated at the arteriovenous anastomosis. Patient tolerated the procedure well and remained hemodynamically stable throughout. No complications were encountered and no significant blood loss. IMPRESSION: Status post ultrasound-guided access of right upper extremity radiocephalic fistula at 2 sites with balloon angioplasty of the arteriovenous anastomosis to 6 mm, and the proximal venous outflow to a 8 mm. Signed, Dulcy Fanny. Dellia Nims, RPVI [facility] Radiology ACCESS: If there is further problems with the fistula, recommend duplex, as the given history of high arterial pressures does not correlate with the findings of the fistulagram, as there was no downstream venous outflow stenosis perceived, only a venous outflow stenosis just beyond the AV anastomosis. Electronically Signed  By: Corrie Mckusick D.O.   On: 02/09/2021 17:44   DG CHEST PORT 1 VIEW  Result Date: 02/01/2021 CLINICAL DATA:  Follow-up exam. Patient has a history of heart murmur and hypertension. EXAM: PORTABLE CHEST 1 VIEW COMPARISON:  January 29, 2021 FINDINGS: The heart size and mediastinal contours are stable. The heart size enlarged. Central pulmonary vascular congestion is identified slightly improved compared prior exam. The visualized skeletal structures are stable. IMPRESSION: Congestive heart failure slightly improved compared prior exam. Electronically Signed   By: Abelardo Diesel M.D.   On: 02/01/2021 09:25   DG Abd Portable 1V  Result Date: 02/03/2021 CLINICAL DATA:  Ileus EXAM: PORTABLE ABDOMEN - 1 VIEW COMPARISON:  CT  02/02/2021 FINDINGS: Diffuse gaseous distension of the stomach, small bowel, and colon. No identifiable transition. No supine evidence of free air. No acute osseous abnormality. Left hip arthroplasty. IMPRESSION: Persistent diffuse gaseous distension of bowel, consistent with adynamic ileus. Electronically Signed   By: Maurine Simmering M.D.   On: 02/03/2021 14:23   DG Abd Portable 1V  Result Date: 01/31/2021 CLINICAL DATA:  Abdominal distension EXAM: PORTABLE ABDOMEN - 1 VIEW COMPARISON:  Portable exam 1230 hours compared to CT abdomen and pelvis 04/20/2020 FINDINGS: Air-filled loops of large and small bowel throughout abdomen, with small-bowel loops upper normal caliber. No definite evidence of obstruction or wall thickening. Bones demineralized with degenerative changes of thoracolumbar spine and note of a LEFT hip prosthesis. IMPRESSION: Nonspecific bowel gas pattern with air-filled nondistended loops of bowel throughout abdomen. Electronically Signed   By: Lavonia Dana M.D.   On: 01/31/2021 14:22   IR AV DIALY SHUNT INTRO NEEDLE/INTRACATH INITIAL W/PTA/IMG RIGHT  Result Date: 02/09/2021 INDICATION: 87 year old male with reported history of high arterial pressure and clot within the cannula at serial hemodialysis session. EXAM: ULTRASOUND-GUIDED ACCESS X2 RIGHT UPPER EXTREMITY BC FISTULA FISTULAGRAM BALLOON ANGIOPLASTY OF THE PROXIMAL VENOUS OUTFLOW MEDICATIONS: 3000 UNITS HEPARIN 1 MG VERSED, 50 MCG FENTANYL ANESTHESIA/SEDATION: Moderate Sedation Time:  46 minutes The patient was continuously monitored during the procedure by the interventional radiology nurse under my direct supervision. FLUOROSCOPY TIME:  Fluoroscopy Time: 4 minutes 6 seconds (9 mGy). COMPLICATIONS: None PROCEDURE: Informed written consent was obtained from the patient after a thorough discussion of the procedural risks, benefits and alternatives. All questions were addressed. Maximal Sterile Barrier Technique was utilized including caps,  mask, sterile gowns, sterile gloves, sterile drape, hand hygiene and skin antiseptic. A timeout was performed prior to the initiation of the procedure. Ultrasound was performed of the right upper extremity. 1% lidocaine was used for local anesthesia. Ultrasound guidance was used for micro puncture access directed centrally. Angiogram was performed of the venous outflow. Reflux images were then performed. Given the appearance of the proximal venous outflow, we planned angioplasty of the venous outflow. Ultrasound-guided access was then required directed peripherally towards the arteriovenous anastomosis. 1% lidocaine was used for local anesthesia. Micropuncture access was achieved and a microwire was placed. A Bentson wire was passed into the venous access. A 7 French sheath was then placed over the Bentson wire. Kumpe the catheter was navigated with the use of a Glidewire into the arterial side of the arteriovenous anastomosis for repeat imaging. Balloon angioplasty was then performed of the arteriovenous anastomosis 6 mm, and then of the proximal venous outflow up the 8 mm. Repeat fistulagram was performed demonstrating resolution of the stenosis in the proximal venous outflow with 0% residual. All catheters wires and sheaths were removed with placement of 2  stay sutures. External and thrill was palpated at the arteriovenous anastomosis. Patient tolerated the procedure well and remained hemodynamically stable throughout. No complications were encountered and no significant blood loss. IMPRESSION: Status post ultrasound-guided access of right upper extremity radiocephalic fistula at 2 sites with balloon angioplasty of the arteriovenous anastomosis to 6 mm, and the proximal venous outflow to a 8 mm. Signed, Dulcy Fanny. Dellia Nims, RPVI [facility] Radiology ACCESS: If there is further problems with the fistula, recommend duplex, as the given history of high arterial  pressures does not correlate with the findings of the fistulagram, as there was no downstream venous outflow stenosis perceived, only a venous outflow stenosis just beyond the AV anastomosis. Electronically Signed   By: Corrie Mckusick D.O.   On: 02/09/2021 17:44   ECHOCARDIOGRAM COMPLETE  Result Date: 02/01/2021    ECHOCARDIOGRAM REPORT   Patient Name:   DAMIONE ROBIDEAU Date of Exam: 02/01/2021 Medical Rec #:  [id number]      Height:       67.0 in Accession #:    [id number]     Weight:       110.2 lb Date of Birth:  1932/10/17      BSA:          1.570 m Patient Age:    60 years       BP:           122/55 mmHg Patient Gender: M              HR:           85 bpm. Exam Location:  Inpatient Procedure: 2D Echo, Cardiac Doppler, Color Doppler and 3D Echo Indications:    Atrial Fibrillation I48.91  History:        Patient has prior history of Echocardiogram examinations, most                 recent 04/21/2020. Aortic Valve Disease; Arrythmias:LBBB. End                 stage renal disease. Acute on chronic diastolic CHF. Multifocal                 atrial tachycardia. Anemia of chronic kidney failure. Closed                 fracture of neck of left femur.  Sonographer:    Darlina Sicilian RDCS Referring Phys: [id number] Ayden  1. Left ventricular ejection fraction, by estimation, is 35 to 40%. Left ventricular ejection fraction by 3D volume is 40 %. The left ventricle has moderately decreased function. The left ventricle demonstrates global hypokinesis. Left ventricular diastolic parameters are consistent with Grade I diastolic dysfunction (impaired relaxation). Elevated left atrial pressure.  2. Right ventricular systolic function is normal. The right ventricular size is normal. Tricuspid regurgitation signal is inadequate for assessing PA pressure.  3. Left atrial size was moderately dilated.  4. The mitral valve is normal in structure. Trivial mitral valve regurgitation.  5. The aortic valve is tricuspid.  There is mild calcification of the aortic valve. There is moderate thickening of the aortic valve. Aortic valve regurgitation is moderate. Mild to moderate aortic valve sclerosis/calcification is present, without any evidence of aortic stenosis.  6. The inferior vena cava is normal in size with greater than 50% respiratory variability, suggesting right atrial pressure of 3 mmHg. Comparison(s): Prior images reviewed side by side. The left ventricular function is worsened. FINDINGS  Left Ventricle: Left ventricular ejection fraction, by estimation, is 35 to 40%. Left ventricular ejection fraction by 3D volume is 40 %. The left ventricle has moderately decreased function. The left ventricle demonstrates global hypokinesis. The left ventricular internal cavity size was normal in size. There is no left ventricular hypertrophy. Abnormal (paradoxical) septal motion, consistent with left bundle branch block. Left ventricular diastolic parameters are consistent with Grade I diastolic dysfunction (impaired relaxation). Elevated left atrial pressure. Right Ventricle: The right ventricular size is normal. No increase in right ventricular wall thickness. Right ventricular systolic function is normal. Tricuspid regurgitation signal is inadequate for assessing PA pressure. Left Atrium: Left atrial size was moderately dilated. Right Atrium: Right atrial size was normal in size. Pericardium: There is no evidence of pericardial effusion. Mitral Valve: The mitral valve is normal in structure. Mild mitral annular calcification. Trivial mitral valve regurgitation. Tricuspid Valve: The tricuspid valve is normal in structure. Tricuspid valve regurgitation is not demonstrated. Aortic Valve: The aortic valve is tricuspid. There is mild calcification of the aortic valve. There is moderate thickening of the aortic valve. Aortic valve regurgitation is moderate. Mild to moderate aortic valve sclerosis/calcification is present, without any  evidence of aortic stenosis. Pulmonic Valve: The pulmonic valve was not well visualized. Pulmonic valve regurgitation is mild to moderate. Aorta: The aortic root and ascending aorta are structurally normal, with no evidence of dilitation. Venous: The inferior vena cava is normal in size with greater than 50% respiratory variability, suggesting right atrial pressure of 3 mmHg. IAS/Shunts: No atrial level shunt detected by color flow Doppler.  LEFT VENTRICLE PLAX 2D LVIDd:         5.50 cm         Diastology LVIDs:         4.40 cm         LV e' medial:    3.30 cm/s LV PW:         0.90 cm         LV E/e' medial:  16.2 LV IVS:        1.10 cm         LV e' lateral:   2.93 cm/s LVOT diam:     2.00 cm         LV E/e' lateral: 18.3 LV SV:         60 LV SV Index:   38 LVOT Area:     3.14 cm        3D Volume EF                                LV 3D EF:    Left                                             ventricul LV Volumes (MOD)                            ar LV vol s, MOD    48.5 ml                    ejection A2C:  fraction                                             by 3D                                             volume is                                             40 %.                                 3D Volume EF:                                3D EF:        40 %                                LV EDV:       189 ml                                LV ESV:       113 ml                                LV SV:        76 ml RIGHT VENTRICLE RV S prime:     10.30 cm/s TAPSE (M-mode): 1.4 cm LEFT ATRIUM           Index        RIGHT ATRIUM           Index LA diam:      4.80 cm 3.06 cm/m   RA Area:     10.60 cm LA Vol (A4C): 36.4 ml 23.15 ml/m  RA Volume:   17.70 ml  11.27 ml/m  AORTIC VALVE LVOT Vmax:   105.00 cm/s LVOT Vmean:  63.500 cm/s LVOT VTI:    0.192 m  AORTA Ao Root diam: 3.60 cm Ao Asc diam:  3.60 cm MITRAL VALVE MV Area (PHT): 2.56 cm    SHUNTS MV Decel Time: 296 msec     Systemic VTI:  0.19 m MV E velocity: 53.50 cm/s  Systemic Diam: 2.00 cm MV A velocity: 96.40 cm/s MV E/A ratio:  0.55 Mihai Croitoru MD Electronically signed by Sanda Klein MD Signature Date/Time: 02/01/2021/2:21:26 PM    Final    DG HIP UNILAT WITH PELVIS 1V LEFT  Result Date: 01/30/2021 CLINICAL DATA:  Left hip hemiarthroplasty EXAM: DG HIP (WITH OR WITHOUT PELVIS) 1V*L* COMPARISON:  01/29/2021 FINDINGS: Single frontal view of the left hip demonstrates interval placement of a left hip hemiarthroplasty in the expected position without signs of acute complication. Postsurgical changes in the soft tissues. IMPRESSION: 1. Unremarkable left hip hemiarthroplasty. Electronically Signed   By: Diana Eves.D.  On: 01/30/2021 19:07   DG Hip Unilat W or Wo Pelvis 2-3 Views Left  Result Date: 01/29/2021 CLINICAL DATA:  Fall. EXAM: DG HIP (WITH OR WITHOUT PELVIS) 2-3V LEFT COMPARISON:  None. FINDINGS: Acute, impacted fracture deformity involves the femoral neck. There is medial angulation of the distal fracture fragments. No dislocation. Right hip is intact. IMPRESSION: Acute, impacted fracture of the left femoral neck. Electronically Signed   By: Kerby Moors M.D.   On: 01/29/2021 09:54    Labs:  CBC: Recent Labs    02/05/21 0323 02/07/21 0631 02/09/21 0332 02/10/21 0822  WBC 14.7* 14.5* 13.0* 20.5*  HGB 10.2* 10.0* 9.7* 9.3*  HCT 30.8* 30.8* 30.4* 28.5*  PLT 272 344 347 409*    COAGS: Recent Labs    01/29/21 0840  INR 1.0    BMP: Recent Labs    02/08/21 0217 02/09/21 0332 02/10/21 0304 02/10/21 0822  NA 136 132* 131* 134*  K 3.7 4.1 4.3 4.4  CL 97* 92* 94* 94*  CO2 28 27 26 26   GLUCOSE 110* 106* 115* 101*  BUN 29* 46* 59* 60*  CALCIUM 9.1 9.4 9.4 9.4  CREATININE 3.62* 5.05* 6.15* 6.50*  GFRNONAA 16* 10* 8* 8*    LIVER FUNCTION TESTS: Recent Labs    04/20/20 1819 04/22/20 0434 01/29/21 0840 01/30/21 0553 02/08/21 0217 02/09/21 0332 02/10/21 0304  02/10/21 0822  BILITOT 0.8 0.6 0.9  --   --   --   --   --   AST 19 23 25   --   --   --   --   --   ALT 13 17 15   --   --   --   --   --   ALKPHOS 88 75 113  --   --   --   --   --   PROT 7.0 5.7* 5.7*  --   --   --   --   --   ALBUMIN 3.6 2.8* 3.1*   < > 2.5* 2.6* 2.6* 2.6*   < > = values in this interval not displayed.    TUMOR MARKERS: No results for input(s): AFPTM, CEA, CA199, CHROMGRNA in the last 8760 hours.  Assessment and Plan:  ESRD RUE dialysis fistula angioplasty in IR 10/17 Yet unable to cannulize today in dialysis Scheduled now for tunneled dialysis catheter placement in IR 10/19 Risks and benefits discussed with the patient including, but not limited to bleeding, infection, vascular injury, pneumothorax which may require chest tube placement, air embolism or even death  All of the patient's questions were answered, patient is agreeable to proceed. Consent signed and in chart.   Thank you for this interesting consult.  I greatly enjoyed meeting Saajan Willmon and look forward to participating in their care.  A copy of this report was sent to the requesting provider on this date.  Electronically Signed: Lavonia Drafts, PA-C 02/10/2021, 1:05 PM   I spent a total of 20 Minutes    in face to face in clinical consultation, greater than 50% of which was counseling/coordinating care for tunn HD catheter placement

## 2021-02-10 NOTE — Progress Notes (Signed)
Attempted to increase BFR to above 200. Arterial pressures at 220 -260. Patient states pain remains the same.

## 2021-02-10 NOTE — TOC Progression Note (Addendum)
Transition of Care [facility]) - Initial/Assessment Note    Patient Details  Name: Jeffrey Payne MRN: [id number] Date of Birth: 1932-04-27  [facility] CM/SW Contact:    Milinda Antis, [place] Phone Number: 02/10/2021, 11:34 AM  Clinical Narrative:                 CSW informed in morning meeting that the patient is a potential d/c for today.  CSW contacted [employer] to get insurance information.  Patient has received authorization to go to a SNF. Josem Kaufmann number [id number] from 10/18-10/20.)    CSW contacted [facility] with admissions at [facility].  The facility can accept the patient today after dialysis.  11:38- CSW contacted attending and requested d/c summary.  11:41-  CSW notified by attending that the patient's fistula is not working and will need to remain inpatient. D/C on hold.  TOC barrier to discharge: Not medically ready   Patient Goals and CMS Choice   CMS Medicare.gov Compare Post Acute Care list provided to:: Patient Represenative (must comment) Choice offered to / list presented to : Spouse  Expected Discharge Plan and Services Expected Discharge Plan: [place]   Discharge Planning Services: CM Consult Post Acute Care Choice: Durable Medical Equipment, Home Health Living arrangements for the past 2 months: Single Family Home                 DME Arranged: 3-N-1         HH Arranged: PT, OT HH Agency: [place] Date HH Agency Contacted: 02/04/21 Time HH Agency Contacted: 1404 Representative spoke with at [place]: [place] Arrangements/Services Living arrangements for the past 2 months: [place] with:: Spouse Patient language and need for interpreter reviewed:: Yes Do you feel safe going back to the place where you live?: Yes      Need for Family Participation in Patient Care: Yes (Comment) Care giver support system in place?: Yes (comment)   Criminal Activity/Legal Involvement Pertinent to Current  Situation/Hospitalization: No - Comment as needed  Activities of Daily Living Home Assistive Devices/Equipment: Walker (specify type) ADL Screening (condition at time of admission) Patient's cognitive ability adequate to safely complete daily activities?: Yes Is the patient deaf or have difficulty hearing?: Yes Does the patient have difficulty seeing, even when wearing glasses/contacts?: No Does the patient have difficulty concentrating, remembering, or making decisions?: No Patient able to express need for assistance with ADLs?: Yes Does the patient have difficulty dressing or bathing?: Yes Independently performs ADLs?: No Communication: Independent Dressing (OT): Needs assistance Is this a change from baseline?: Pre-admission baseline Grooming: Needs assistance Is this a change from baseline?: Pre-admission baseline Feeding: Independent Bathing: Needs assistance Is this a change from baseline?: Pre-admission baseline Toileting: Independent In/Out Bed: Independent Walks in Home: Independent Does the patient have difficulty walking or climbing stairs?: Yes Weakness of Legs: Left Weakness of Arms/Hands: None  Permission Sought/Granted                  Emotional Assessment       Orientation: : Oriented to Self, Oriented to Place, Oriented to  Time Alcohol / Substance Use: Not Applicable Psych Involvement: No (comment)  Admission diagnosis:  Femoral neck fracture ([place]) [S72.009A] Fall [W19.XXXA] Closed fracture of left hip, initial encounter ([place]) [S72.002A] Injury of head, initial encounter [S09.90XA] Fall, initial encounter [W19.XXXA] Patient Active Problem List   Diagnosis Date Noted   Ileus, unspecified ([place]) 02/02/2021   Acute  combined systolic and diastolic congestive heart failure ([place]) 02/01/2021   Ear stuffiness, right 01/31/2021   Closed fracture of neck of left femur ([place]) 01/30/2021   Scalp laceration 01/30/2021   Hypoxia 01/30/2021   Multifocal atrial  tachycardia ([place]) 01/30/2021   LBBB (left bundle branch block) 01/30/2021   ESRD (end stage renal disease) ([place]) 01/30/2021   Fall at home, initial encounter 01/29/2021   BPH with urinary obstruction 01/29/2021   Anemia of chronic kidney failure 01/29/2021   Leukocytosis 01/29/2021   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   Allergic rhinitis due to pollen 09/09/2020   Gastroesophageal reflux disease 09/09/2020   Hyperlipidemia 09/09/2020   Hyperparathyroidism due to renal insufficiency ([place]) 09/09/2020   Osteoarthritis of knee 04/88/8916   Systolic murmur 94/50/3888   AKI (acute kidney injury) ([place]) 04/20/2020   Acute kidney injury superimposed on CKD ([place]) 10/06/2019   CKD (chronic kidney disease), stage IV ([place]) 10/05/2019   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[place]) 10/05/2019   Bradycardia 10/05/2019   Hypotension 10/05/2019   Fatigue 10/05/2019   Hypertension    Heart murmur    PCP:  Kristen Loader, FNP Pharmacy:   [id number] - [place], [place] - [address] AT [address] [place] [address] Phone: [phone] Fax: [phone]  [facility] - [place], [place] - [address], [address] [place] [address] Phone: [phone] Fax: [phone]     Social Determinants of Health (SDOH) Interventions    Readmission Risk Interventions No flowsheet data found.

## 2021-02-10 NOTE — Progress Notes (Signed)
Progress Note  Patient Name: Jeffrey Payne Date of Encounter: 02/10/2021  [facility] Cardiologist: Minus Breeding, MD    Subjective    87 yo with hx of CHF, Atrial fib, ESRD,  In Previous discussions, the family has favored a conservative approach to his CHF work up .    He has had some evidence of Atrial fib, MAT, SVT He is a poor candidate for chronic anticoagulation  We have opted for rate control  HR is 75 today   He is very weak today  HR remains well controlled.   Inpatient Medications    Scheduled Meds:  aspirin EC  81 mg Oral Daily   Chlorhexidine Gluconate Cloth  6 each Topical Q0600   [START ON 02/11/2021] Chlorhexidine Gluconate Cloth  6 each Topical Q0600   [START ON 02/12/2021] darbepoetin (ARANESP) injection - DIALYSIS  25 mcg Intravenous Q Thu-HD   feeding supplement (NEPRO CARB STEADY)  237 mL Oral TID BM   [START ON 02/12/2021] heparin  5,000 Units Subcutaneous Q8H   metoprolol tartrate  12.5 mg Oral TID   midodrine  5 mg Oral TID WC   multivitamin  1 tablet Oral QHS   mupirocin ointment   Topical BID   polyethylene glycol  17 g Oral Daily   sevelamer carbonate  800 mg Oral TID WC   tamsulosin  0.4 mg Oral BID   Continuous Infusions:  PRN Meds: acetaminophen, fentaNYL, heparin sodium (porcine), iohexol, metoprolol tartrate, midazolam, oxyCODONE   Vital Signs    Vitals:   02/10/21 0915 02/10/21 0923 02/10/21 0926 02/10/21 1025  BP: (!) 100/49 (!) 119/52 (!) 136/49 (!) 124/45  Pulse: 77 74 68 75  Resp:   15 17  Temp:   98 F (36.7 C) 98 F (36.7 C)  TempSrc:   Oral Oral  SpO2:   95% 99%  Weight:   50.4 kg   Height:        Intake/Output Summary (Last 24 hours) at 02/10/2021 1613 Last data filed at 02/10/2021 6578 Gross per 24 hour  Intake --  Output -391 ml  Net 391 ml   Last 3 Weights 02/10/2021 02/10/2021 02/07/2021  Weight (lbs) 111 lb 1.8 oz 109 lb 5.6 oz 105 lb 13.1 oz  Weight (kg) 50.4 kg 49.6 kg 48 kg      Telemetry     Atrial fib with controlled V response  - Personally Reviewed  ECG     - Personally Reviewed  Physical Exam   GEN: elderly man,  very frail , somulent ,  very difficult for him to keep a conversation going  Neck: No JVD Cardiac:  irreg. Irreg.  Respiratory: Clear to auscultation bilaterally. GI: Soft, nontender, non-distended  MS: No edema; No deformity. Neuro:  Nonfocal  Psych: Normal affect   Labs    High Sensitivity Troponin:   Recent Labs  Lab 02/05/21 1642 02/05/21 1830  TROPONINIHS 65* 72*     Chemistry Recent Labs  Lab 02/06/21 0248 02/07/21 0230 02/09/21 0332 02/10/21 0304 02/10/21 0822  NA 137   < > 132* 131* 134*  K 3.5   < > 4.1 4.3 4.4  CL 99   < > 92* 94* 94*  CO2 27   < > 27 26 26   GLUCOSE 126*   < > 106* 115* 101*  BUN 26*   < > 46* 59* 60*  CREATININE 3.74*   < > 5.05* 6.15* 6.50*  CALCIUM 9.0   < > 9.4  9.4 9.4  MG 2.1  --   --   --   --   ALBUMIN 2.5*   < > 2.6* 2.6* 2.6*  GFRNONAA 15*   < > 10* 8* 8*  ANIONGAP 11   < > 13 11 14    < > = values in this interval not displayed.    Lipids No results for input(s): CHOL, TRIG, HDL, LABVLDL, LDLCALC, CHOLHDL in the last 168 hours.  Hematology Recent Labs  Lab 02/07/21 0631 02/09/21 0332 02/10/21 0822  WBC 14.5* 13.0* 20.5*  RBC 3.12* 3.06* 2.89*  HGB 10.0* 9.7* 9.3*  HCT 30.8* 30.4* 28.5*  MCV 98.7 99.3 98.6  MCH 32.1 31.7 32.2  MCHC 32.5 31.9 32.6  RDW 13.3 13.2 13.3  PLT 344 347 409*   Thyroid  Recent Labs  Lab 02/07/21 0230  TSH 4.116    BNPNo results for input(s): BNP, PROBNP in the last 168 hours.  DDimer No results for input(s): DDIMER in the last 168 hours.   Radiology    IR US Guide Vasc Access Right  Result Date: 02/09/2021 INDICATION: 87 year old male with reported history of high arterial pressure and clot within the cannula at serial hemodialysis session. EXAM: ULTRASOUND-GUIDED ACCESS X2 RIGHT UPPER EXTREMITY BC FISTULA FISTULAGRAM BALLOON ANGIOPLASTY OF THE  PROXIMAL VENOUS OUTFLOW MEDICATIONS: 3000 UNITS HEPARIN 1 MG VERSED, 50 MCG FENTANYL ANESTHESIA/SEDATION: Moderate Sedation Time:  46 minutes The patient was continuously monitored during the procedure by the interventional radiology nurse under my direct supervision. FLUOROSCOPY TIME:  Fluoroscopy Time: 4 minutes 6 seconds (9 mGy). COMPLICATIONS: None PROCEDURE: Informed written consent was obtained from the patient after a thorough discussion of the procedural risks, benefits and alternatives. All questions were addressed. Maximal Sterile Barrier Technique was utilized including caps, mask, sterile gowns, sterile gloves, sterile drape, hand hygiene and skin antiseptic. A timeout was performed prior to the initiation of the procedure. Ultrasound was performed of the right upper extremity. 1% lidocaine was used for local anesthesia. Ultrasound guidance was used for micro puncture access directed centrally. Angiogram was performed of the venous outflow. Reflux images were then performed. Given the appearance of the proximal venous outflow, we planned angioplasty of the venous outflow. Ultrasound-guided access was then required directed peripherally towards the arteriovenous anastomosis. 1% lidocaine was used for local anesthesia. Micropuncture access was achieved and a microwire was placed. A Bentson wire was passed into the venous access. A 7 French sheath was then placed over the Bentson wire. [place] the catheter was navigated with the use of a Glidewire into the arterial side of the arteriovenous anastomosis for repeat imaging. Balloon angioplasty was then performed of the arteriovenous anastomosis 6 mm, and then of the proximal venous outflow up the 8 mm. Repeat fistulagram was performed demonstrating resolution of the stenosis in the proximal venous outflow with 0% residual. All catheters wires and sheaths were removed with placement of 2 stay sutures. External and thrill was palpated at the arteriovenous  anastomosis. Patient tolerated the procedure well and remained hemodynamically stable throughout. No complications were encountered and no significant blood loss. IMPRESSION: Status post ultrasound-guided access of right upper extremity radiocephalic fistula at 2 sites with balloon angioplasty of the arteriovenous anastomosis to 6 mm, and the proximal venous outflow to a 8 mm. Signed, Dulcy Fanny. Dellia Nims, RPVI [facility] Radiology ACCESS: If there is further problems with the fistula, recommend duplex, as the given history of high arterial pressures does not correlate with the  findings of the fistulagram, as there was no downstream venous outflow stenosis perceived, only a venous outflow stenosis just beyond the AV anastomosis. Electronically Signed   By: Corrie Mckusick D.O.   On: 02/09/2021 17:44   IR AV DIALY SHUNT INTRO NEEDLE/INTRACATH INITIAL W/PTA/IMG RIGHT  Result Date: 02/09/2021 INDICATION: 87 year old male with reported history of high arterial pressure and clot within the cannula at serial hemodialysis session. EXAM: ULTRASOUND-GUIDED ACCESS X2 RIGHT UPPER EXTREMITY BC FISTULA FISTULAGRAM BALLOON ANGIOPLASTY OF THE PROXIMAL VENOUS OUTFLOW MEDICATIONS: 3000 UNITS HEPARIN 1 MG VERSED, 50 MCG FENTANYL ANESTHESIA/SEDATION: Moderate Sedation Time:  46 minutes The patient was continuously monitored during the procedure by the interventional radiology nurse under my direct supervision. FLUOROSCOPY TIME:  Fluoroscopy Time: 4 minutes 6 seconds (9 mGy). COMPLICATIONS: None PROCEDURE: Informed written consent was obtained from the patient after a thorough discussion of the procedural risks, benefits and alternatives. All questions were addressed. Maximal Sterile Barrier Technique was utilized including caps, mask, sterile gowns, sterile gloves, sterile drape, hand hygiene and skin antiseptic. A timeout was performed prior to the initiation of the procedure.  Ultrasound was performed of the right upper extremity. 1% lidocaine was used for local anesthesia. Ultrasound guidance was used for micro puncture access directed centrally. Angiogram was performed of the venous outflow. Reflux images were then performed. Given the appearance of the proximal venous outflow, we planned angioplasty of the venous outflow. Ultrasound-guided access was then required directed peripherally towards the arteriovenous anastomosis. 1% lidocaine was used for local anesthesia. Micropuncture access was achieved and a microwire was placed. A Bentson wire was passed into the venous access. A 7 French sheath was then placed over the Bentson wire. [place] the catheter was navigated with the use of a Glidewire into the arterial side of the arteriovenous anastomosis for repeat imaging. Balloon angioplasty was then performed of the arteriovenous anastomosis 6 mm, and then of the proximal venous outflow up the 8 mm. Repeat fistulagram was performed demonstrating resolution of the stenosis in the proximal venous outflow with 0% residual. All catheters wires and sheaths were removed with placement of 2 stay sutures. External and thrill was palpated at the arteriovenous anastomosis. Patient tolerated the procedure well and remained hemodynamically stable throughout. No complications were encountered and no significant blood loss. IMPRESSION: Status post ultrasound-guided access of right upper extremity radiocephalic fistula at 2 sites with balloon angioplasty of the arteriovenous anastomosis to 6 mm, and the proximal venous outflow to a 8 mm. Signed, Dulcy Fanny. Dellia Nims, RPVI [facility] Radiology ACCESS: If there is further problems with the fistula, recommend duplex, as the given history of high arterial pressures does not correlate with the findings of the fistulagram, as there was no downstream venous outflow stenosis perceived, only a venous outflow  stenosis just beyond the AV anastomosis. Electronically Signed   By: Corrie Mckusick D.O.   On: 02/09/2021 17:44    Cardiac Studies      Patient Profile     87 y.o. male with CHF, atrial fib   Assessment & Plan     Chronic systolic chf:  given his very poor condition , the family has elevated on a very conservative approach to his CHF.   No plans for ischemia work up , no plans for invasive testing   2.  Atrial fib:   HR is well controlled.  No plans for DOAC upon discharge  3.  ESRD :  plans per nephrology    [facility] will sign off.   Medication Recommendations:  cont current meds.  Other recommendations (labs, testing, etc):   Follow up as an outpatient:  with his primary MD   For questions or updates, please contact [place] Please consult [email or web address] for contact info under        Signed, Mertie Moores, MD  02/10/2021, 4:13 PM

## 2021-02-10 NOTE — Plan of Care (Signed)
  Problem: Education: Goal: Knowledge of General Education information will improve Description: Including pain rating scale, medication(s)/side effects and non-pharmacologic comfort measures Outcome: Progressing   Problem: Health Behavior/Discharge Planning: Goal: Ability to manage health-related needs will improve Outcome: Progressing   Problem: Clinical Measurements: Goal: Ability to maintain clinical measurements within normal limits will improve Outcome: Progressing   Problem: Activity: Goal: Risk for activity intolerance will decrease Outcome: Progressing   Problem: Coping: Goal: Level of anxiety will decrease Outcome: Progressing   Problem: Elimination: Goal: Will not experience complications related to bowel motility Outcome: Progressing Goal: Will not experience complications related to urinary retention Outcome: Progressing   Problem: Pain Managment: Goal: General experience of comfort will improve Outcome: Progressing   Problem: Safety: Goal: Ability to remain free from injury will improve Outcome: Progressing   Problem: Skin Integrity: Goal: Risk for impaired skin integrity will decrease Outcome: Progressing

## 2021-02-10 NOTE — Progress Notes (Signed)
Patient with complaints of numbness and pain in access arm upon initiation of treatment. BFR at 200. Dr. Jonnie Finner at bedside notified. Per verbal order if pain gets worse or cannot increase bfr above 200, then terminate treatment.

## 2021-02-10 NOTE — Progress Notes (Addendum)
[facility] Progress Note   Subjective:   Fistulogram completed yesterday, proximal outflow stenosis treated with angioplasty. Seen on HD today. Arm pain as tech is removing tape, but otherwise no concerns.   Addendum: Unfortunately unable to cannulate again today. Will need TDC and dialysis tomorrow.   Objective Vitals:   02/09/21 1635 02/09/21 1650 02/09/21 2107 02/10/21 0823  BP: (!) 123/49 124/60 (!) 101/27 (!) 129/46  Pulse: 68 72 65 70  Resp: 18 17 14 15   Temp:   97.9 F (36.6 C)   TempSrc:      SpO2: 98% 97% 97% 95%  Weight:    49.6 kg  Height:       Physical Exam General: WDWN elderly male in NAD Heart: RRR, no murmur, rubs or gallops Lungs: CTA bilaterally   Abdomen: Soft, non-distended, +BS Extremities: No edema b/l lower extremities Dialysis Access:  RUE AVF with + hematoma over proximal portion  Additional Objective Labs: Basic Metabolic Panel: Recent Labs  Lab 02/08/21 0217 02/09/21 0332 02/10/21 0304  NA 136 132* 131*  K 3.7 4.1 4.3  CL 97* 92* 94*  CO2 28 27 26   GLUCOSE 110* 106* 115*  BUN 29* 46* 59*  CREATININE 3.62* 5.05* 6.15*  CALCIUM 9.1 9.4 9.4  PHOS 4.0 4.9* 5.5*   Liver Function Tests: Recent Labs  Lab 02/08/21 0217 02/09/21 0332 02/10/21 0304  ALBUMIN 2.5* 2.6* 2.6*   CBC: Recent Labs  Lab 02/04/21 0133 02/05/21 0323 02/07/21 0631 02/09/21 0332  WBC 15.4* 14.7* 14.5* 13.0*  NEUTROABS  --  11.4*  --  8.4*  HGB 10.8* 10.2* 10.0* 9.7*  HCT 32.9* 30.8* 30.8* 30.4*  MCV 98.5 97.8 98.7 99.3  PLT 250 272 344 347   Blood Culture    Component Value Date/Time   SDES  04/21/2020 0328    BLOOD RIGHT HAND Performed at [facility], [place] [address] [place], [place] [address]    SPECREQUEST  04/21/2020 0328    BOTTLES DRAWN AEROBIC ONLY Blood Culture adequate volume Performed at [place] [address], [place], [place] [address]    CULT  04/21/2020 0328    NO GROWTH 5  DAYS Performed at [facility], [place] [address], [place], [place] [address]    REPTSTATUS 04/26/2020 FINAL 04/21/2020 0328    CBG: Recent Labs  Lab 02/06/21 1551  GLUCAP 132*    Studies/Results: IR US Guide Vasc Access Right  Result Date: 02/09/2021 INDICATION: 87 year old male with reported history of high arterial pressure and clot within the cannula at serial hemodialysis session. EXAM: ULTRASOUND-GUIDED ACCESS X2 RIGHT UPPER EXTREMITY BC FISTULA FISTULAGRAM BALLOON ANGIOPLASTY OF THE PROXIMAL VENOUS OUTFLOW MEDICATIONS: 3000 UNITS HEPARIN 1 MG VERSED, 50 MCG FENTANYL ANESTHESIA/SEDATION: Moderate Sedation Time:  46 minutes The patient was continuously monitored during the procedure by the interventional radiology nurse under my direct supervision. FLUOROSCOPY TIME:  Fluoroscopy Time: 4 minutes 6 seconds (9 mGy). COMPLICATIONS: None PROCEDURE: Informed written consent was obtained from the patient after a thorough discussion of the procedural risks, benefits and alternatives. All questions were addressed. Maximal Sterile Barrier Technique was utilized including caps, mask, sterile gowns, sterile gloves, sterile drape, hand hygiene and skin antiseptic. A timeout was performed prior to the initiation of the procedure. Ultrasound was performed of the right upper extremity. 1% lidocaine was used for local anesthesia. Ultrasound guidance was used for micro puncture access directed centrally. Angiogram was performed of the venous outflow. Reflux images were  then performed. Given the appearance of the proximal venous outflow, we planned angioplasty of the venous outflow. Ultrasound-guided access was then required directed peripherally towards the arteriovenous anastomosis. 1% lidocaine was used for local anesthesia. Micropuncture access was achieved and a microwire was placed. A Bentson wire was passed into the venous access. A 7 French sheath was then placed over the Bentson wire. Kumpe the  catheter was navigated with the use of a Glidewire into the arterial side of the arteriovenous anastomosis for repeat imaging. Balloon angioplasty was then performed of the arteriovenous anastomosis 6 mm, and then of the proximal venous outflow up the 8 mm. Repeat fistulagram was performed demonstrating resolution of the stenosis in the proximal venous outflow with 0% residual. All catheters wires and sheaths were removed with placement of 2 stay sutures. External and thrill was palpated at the arteriovenous anastomosis. Patient tolerated the procedure well and remained hemodynamically stable throughout. No complications were encountered and no significant blood loss. IMPRESSION: Status post ultrasound-guided access of right upper extremity radiocephalic fistula at 2 sites with balloon angioplasty of the arteriovenous anastomosis to 6 mm, and the proximal venous outflow to a 8 mm. Signed, Dulcy Fanny. Dellia Nims, RPVI [facility] Inter[facility] Radiology [facility] ACCESS: If there is further problems with the fistula, recommend duplex, as the given history of high arterial pressures does not correlate with the findings of the fistulagram, as there was no downstream venous outflow stenosis perceived, only a venous outflow stenosis just beyond the AV anastomosis. Electronically Signed   By: Corrie Mckusick D.O.   On: 02/09/2021 17:44   IR AV DIALY SHUNT INTRO NEEDLE/INTRACATH INITIAL W/PTA/IMG RIGHT  Result Date: 02/09/2021 INDICATION: 87 year old male with reported history of high arterial pressure and clot within the cannula at serial hemodialysis session. EXAM: ULTRASOUND-GUIDED ACCESS X2 RIGHT UPPER EXTREMITY BC FISTULA FISTULAGRAM BALLOON ANGIOPLASTY OF THE PROXIMAL VENOUS OUTFLOW MEDICATIONS: 3000 UNITS HEPARIN 1 MG VERSED, 50 MCG FENTANYL ANESTHESIA/SEDATION: Moderate Sedation Time:  46 minutes The patient was continuously monitored during the procedure by the interventional radiology  nurse under my direct supervision. FLUOROSCOPY TIME:  Fluoroscopy Time: 4 minutes 6 seconds (9 mGy). COMPLICATIONS: None PROCEDURE: Informed written consent was obtained from the patient after a thorough discussion of the procedural risks, benefits and alternatives. All questions were addressed. Maximal Sterile Barrier Technique was utilized including caps, mask, sterile gowns, sterile gloves, sterile drape, hand hygiene and skin antiseptic. A timeout was performed prior to the initiation of the procedure. Ultrasound was performed of the right upper extremity. 1% lidocaine was used for local anesthesia. Ultrasound guidance was used for micro puncture access directed centrally. Angiogram was performed of the venous outflow. Reflux images were then performed. Given the appearance of the proximal venous outflow, we planned angioplasty of the venous outflow. Ultrasound-guided access was then required directed peripherally towards the arteriovenous anastomosis. 1% lidocaine was used for local anesthesia. Micropuncture access was achieved and a microwire was placed. A Bentson wire was passed into the venous access. A 7 French sheath was then placed over the Bentson wire. Kumpe the catheter was navigated with the use of a Glidewire into the arterial side of the arteriovenous anastomosis for repeat imaging. Balloon angioplasty was then performed of the arteriovenous anastomosis 6 mm, and then of the proximal venous outflow up the 8 mm. Repeat fistulagram was performed demonstrating resolution of the stenosis in the proximal venous outflow with 0% residual. All catheters wires and sheaths were removed with placement of 2 stay  sutures. External and thrill was palpated at the arteriovenous anastomosis. Patient tolerated the procedure well and remained hemodynamically stable throughout. No complications were encountered and no significant blood loss. IMPRESSION: Status post ultrasound-guided access of right upper extremity  radiocephalic fistula at 2 sites with balloon angioplasty of the arteriovenous anastomosis to 6 mm, and the proximal venous outflow to a 8 mm. Signed, Dulcy Fanny. Dellia Nims, RPVI [facility] Interventional Radiology Specialists [facility] ACCESS: If there is further problems with the fistula, recommend duplex, as the given history of high arterial pressures does not correlate with the findings of the fistulagram, as there was no downstream venous outflow stenosis perceived, only a venous outflow stenosis just beyond the AV anastomosis. Electronically Signed   By: Corrie Mckusick D.O.   On: 02/09/2021 17:44   Medications:   aspirin EC  81 mg Oral Daily   Chlorhexidine Gluconate Cloth  6 each Topical Q0600   darbepoetin (ARANESP) injection - DIALYSIS  25 mcg Intravenous Q Tue-HD   feeding supplement (NEPRO CARB STEADY)  237 mL Oral TID BM   heparin  5,000 Units Subcutaneous Q8H   metoprolol tartrate  12.5 mg Oral TID   midodrine  5 mg Oral TID WC   multivitamin  1 tablet Oral QHS   mupirocin ointment   Topical BID   polyethylene glycol  17 g Oral Daily   sevelamer carbonate  800 mg Oral TID WC   tamsulosin  0.4 mg Oral BID    Dialysis Orders: TTS at [facility] 4hr, 400/700, EDW 54.5kg (needs new EDW), 2K/2Ca, R AVF,  - heparin 4000 units IV TIW - No ESA, VDRA (fairly recent start)  Assessment/Plan: 1. L hip fracture s/p fall: s/pL hemiarthroplasty 01/30/2021 by ortho. 2. ESRD: Continue HD on TTS schedule; having some issues with hypotension during HD but improving. He is well below his EDW and volume status is stable, minimal UF with HD. Unfortunately unable to cannulate again today. Will need TDC and dialysis tomorrow.  3. Vascular access issues: S/p fistulogram on 02/09/21. Working well so far today.  4. HTN/volume: Mild hypoxia/pulm edem on admit-improved. Appears euvolemic today.  5. Anemia: Hgb 11.1 on admit, trended down to 9.7. Given Aranesp 25 mcg IV 01/31/21, will give another dose  with HD today.  6. Secondary hyperparathyroidism: CorrCa high, Not binders or VDRA. Use 2.0 Ca bath. phos restricted diet. Started renvela , phos is controlled.  7. Nutrition: Alb low,push protein supplements 8. Hx urinary retention: has foley catheter. Per primary     Anice Paganini, PA-C 02/10/2021, 8:29 AM  [facility] Pager: [phone]

## 2021-02-10 NOTE — Progress Notes (Signed)
Occupational Therapy Treatment Patient Details Name: Jeffrey Payne MRN: [id number] DOB: 1932-08-16 Today's Date: 02/10/2021   History of present illness Pt. is 87 yr old M admitted on 10/6 following fall resulting in L hip pain. Imaging (+) for impacted L fem neck fx. CT head/C-spine (-) for acute injury. Pt. underwent L partial hemiarthroplasy (ant/lat approach) on 10/7. Pt. diagnosed with ileus, placed on NG tube 10/11; pulled out NG tube overnight. PMH: anemia, arthritis, prostate CA, CKD, HOH, heart murmur, HTN, ESRD on HD   OT comments  Pt presents with decreased cognition and ADL performance during this session, requiring increased cues and assistance. Pt requesting to go to BR, however unable to make it and required BSC be brought nearer bed. Pt required increased assistance to perform toilet hygiene as well (see details below). Pt noted to have had issues with HD this morning which may explain decreased performance. Will continue to follow acutely.   Recommendations for follow up therapy are one component of a multi-disciplinary discharge planning process, led by the attending physician.  Recommendations may be updated based on patient status, additional functional criteria and insurance authorization.    Follow Up Recommendations  SNF;Supervision/Assistance - 24 hour    Equipment Recommendations  3 in 1 bedside commode;Hospital bed    Recommendations for Other Services      Precautions / Restrictions Precautions Precautions: Anterior Hip Precaution Booklet Issued: Yes (comment) Precaution Comments: Precaution sheet in room Restrictions Weight Bearing Restrictions: Yes LLE Weight Bearing: Weight bearing as tolerated       Mobility Bed Mobility Overal bed mobility: Needs Assistance Bed Mobility: Sit to Supine;Supine to Sit     Supine to sit: Min assist;HOB elevated Sit to supine: Min assist   General bed mobility comments: Requiring Min A for cues for hand placement  and use of rails during supine<>sit. Also requiring assistance to lift BLEs back into bed.    Transfers Overall transfer level: Needs assistance Equipment used: Rolling walker (2 wheeled) Transfers: Sit to/from Stand Sit to Stand: Min assist              Balance Overall balance assessment: Needs assistance Sitting-balance support: No upper extremity supported;Feet supported Sitting balance-Leahy Scale: Fair     Standing balance support: During functional activity;Bilateral upper extremity supported Standing balance-Leahy Scale: Poor                             ADL either performed or assessed with clinical judgement   ADL Overall ADL's : Needs assistance/impaired                         Toilet Transfer: Minimal assistance;Adhering to hip precautions;Cueing for sequencing;Cueing for safety;Ambulation;BSC;Grab bars;RW Armed forces technical officer Details (indicate cue type and reason): Pt unable to make it to BR for toilet transfer and required BSC be brought closer to bed. Toileting- Clothing Manipulation and Hygiene: Total assistance;Sit to/from stand Toileting - Clothing Manipulation Details (indicate cue type and reason): Requiring total A during this attempt due to level of arousal and extent of soiling.     Functional mobility during ADLs: Minimal assistance;Cueing for safety;Cueing for sequencing;Rolling walker General ADL Comments: Pt requiring increased assistance for ADLs and functional mobility due to pain, weakness, and decreased cognition.     Vision       Perception     Praxis      Cognition Arousal/Alertness: Lethargic Behavior During Therapy: [facility]  for tasks assessed/performed Overall Cognitive Status: Impaired/Different from baseline Area of Impairment: Attention;Following commands;Awareness                   Current Attention Level: Alternating   Following Commands: Follows one step commands inconsistently   Awareness: Emergent    General Comments: Pt with decreased cognition compared to previous sessions with this therapist, requiring increased verbal and tactile cues for following commands and attention to task.        Exercises     Shoulder Instructions       General Comments      Pertinent Vitals/ Pain       Pain Assessment: Faces Faces Pain Scale: Hurts little more Pain Location: L hip Pain Descriptors / Indicators: Aching;Tender;Discomfort Pain Intervention(s): Limited activity within patient's tolerance;Monitored during session;Repositioned  Home Living                                          Prior Functioning/Environment              Frequency  Min 2X/week        Progress Toward Goals  OT Goals(current goals can now be found in the care plan section)  Progress towards OT goals: Progressing toward goals  Acute Rehab OT Goals Patient Stated Goal: SNF, return home OT Goal Formulation: With patient/family Time For Goal Achievement: 02/15/21 Potential to Achieve Goals: Good ADL Goals Pt Will Perform Grooming: with min guard assist;standing Pt Will Transfer to Toilet: ambulating;bedside commode;with supervision Pt Will Perform Toileting - Clothing Manipulation and hygiene: with supervision;sit to/from stand Additional ADL Goal #1: Pt will be S in and OOB for basic ADLs  Plan Discharge plan remains appropriate;Frequency remains appropriate    Co-evaluation                 AM-PAC OT "6 Clicks" Daily Activity     Outcome Measure   Help from another person eating meals?: None Help from another person taking care of personal grooming?: A Little Help from another person toileting, which includes using toliet, bedpan, or urinal?: A Little Help from another person bathing (including washing, rinsing, drying)?: A Lot Help from another person to put on and taking off regular upper body clothing?: A Little Help from another person to put on and taking off  regular lower body clothing?: A Lot 6 Click Score: 17    End of Session Equipment Utilized During Treatment: Gait belt;Rolling walker  OT Visit Diagnosis: Unsteadiness on feet (R26.81);Other abnormalities of gait and mobility (R26.89);Muscle weakness (generalized) (M62.81);Pain Pain - Right/Left: Left Pain - part of body: Hip   Activity Tolerance Patient limited by fatigue;Patient limited by lethargy   Patient Left in bed;with call bell/phone within reach;with family/visitor present;with nursing/sitter in room   Nurse Communication Mobility status        Time: 2876-8115 OT Time Calculation (min): 53 min  Charges: OT General Charges $OT Visit: 1 Visit OT Treatments $Self Care/Home Management : 53-67 mins  Terressa Evola C, OT/L  Acute Rehab [place] 02/10/2021, 5:29 PM

## 2021-02-10 NOTE — Progress Notes (Addendum)
Nutrition Follow-up  DOCUMENTATION CODES:   Underweight, Severe malnutrition in context of chronic illness  INTERVENTION:   -Continue Nepro Shake po BID, each supplement provides 425 kcal and 19 grams protein  -Continue renal MVI daily -Downgrade diet to dysphagia 3 (advanced mechanical sosft) for ease of intake  NUTRITION DIAGNOSIS:   Severe Malnutrition related to chronic illness (ESRD on HD) as evidenced by moderate fat depletion, severe fat depletion, moderate muscle depletion, severe muscle depletion, percent weight loss.  Ongoing  GOAL:   Patient will meet greater than or equal to 90% of their needs  Progressing   MONITOR:   PO intake, Supplement acceptance, Labs, Weight trends, Skin, I & O's  REASON FOR ASSESSMENT:   Other (Comment)    ASSESSMENT:   Jeffrey Payne is a 87 y.o. male with medical history significant of hypertension, ESRD on HD, state cancer, arthritis, and GERD who presents after having a fall at home with left hip pain.  He was standing at the end of the bed adjusting his underwear when he lost his balance and fell forward.  He denies any loss of consciousness, but thinks that he hit his head on the [place] drawer that is nearby.  Patient noted significant pain in his left hip, but was able to be assisted back into bed with the help of his wife.  Normally patient ambulates with use of a walker.  He is not on any blood thinners.  Prior to going to bed last night patient did complain of having some mild shortness of breath, but it improved when he was lying on his side.    He is due for hemodialysis today.  Denies having any fever, chest pain, palpitations, nausea, vomiting, diarrhea, or significant cough.  Him and his wife believe that he had a tetanus booster within the last 10 years.  Any kind of movement made pain in his left leg worse, and therefore he came into the hospital for further evaluation.  10/7- s/p left hip hemiarthroplasty 10/11- NGT placed  for decompression 10/12- pt removed NGT  Reviewed I/O's: +391 ml x 24 hours and -6.7 L since admission  Spoke with pt and wife at bedside. Pt expressed frustration due to multiple setbacks. RD provided emotional support.   Per pt wife, pt with good appetite PTA. On HD days, pt consumed 2 meals per day (Breakfast: grits, eggs, and toast; Dinner: meat, starch, and vegetable) and 3 meals on non-HD days (Breakfast: grits, eggs, and toast; Lunch: sandwich; Dinner: meat, starch, and vegetable).   Per wife, intake has been improving- he consumed most of his dinner last night. Pt also reports liking Nepro shakes- RD provided pt with one, which he consumed during visit. Noted meal completions 50%.   Per pt wife, wt was stable PTA. She is concerned that pt has lost weight since hospitalization. Reviewed wt hx; pt has experienced a 14.7% wt loss over the past 5 months, which is significant for time frame.   Medications reviewed and include aranesp and miralax.   Labs reviewed: Na: 134.  Phos: 5.4.   NUTRITION - FOCUSED PHYSICAL EXAM:  Flowsheet Row Most Recent Value  Orbital Region Severe depletion  Upper Arm Region Moderate depletion  Thoracic and Lumbar Region Moderate depletion  Buccal Region Severe depletion  Temple Region Moderate depletion  Clavicle Bone Region Severe depletion  Clavicle and Acromion Bone Region Severe depletion  Scapular Bone Region Severe depletion  Dorsal Hand Moderate depletion  Patellar Region Severe depletion  Anterior  Thigh Region Severe depletion  Posterior Calf Region Severe depletion  Edema (RD Assessment) None  Hair Reviewed  Eyes Reviewed  Mouth Reviewed  Skin Reviewed  Nails Reviewed       Diet Order:   Diet Order             Diet NPO time specified Except for: Sips with Meds  Diet effective midnight           Diet renal with fluid restriction Fluid restriction: 1200 mL Fluid; Room service appropriate? Yes with Assist; Fluid consistency: Thin   Diet effective now                   EDUCATION NEEDS:   Education needs have been addressed  Skin:  Skin Assessment: Skin Integrity Issues: Skin Integrity Issues:: Other (Comment), Incisions Incisions: closed lt hip Other: scalp laceration  Last BM:  02/10/21  Height:   Ht Readings from Last 1 Encounters:  01/30/21 5\' 7"  (1.702 m)    Weight:   Wt Readings from Last 1 Encounters:  02/10/21 50.4 kg    Ideal Body Weight:  67.3 kg  BMI:  Body mass index is 17.4 kg/m.  Estimated Nutritional Needs:   Kcal:  1700-1900  Protein:  85-100 grams  Fluid:  1000 ml + UOP    Loistine Chance, RD, LDN, CDCES Registered Dietitian II Certified Diabetes Care and Education Specialist Please refer to [facility] for RD and/or RD on-call/weekend/after hours pager

## 2021-02-10 NOTE — Discharge Summary (Signed)
Physician Discharge Summary  Jeffrey Payne VOJ:[id number] DOB: [birth year] DOA: 01/29/2021  PCP: Kristen Loader, FNP  Admit date: 01/29/2021 Discharge date: 02/10/2021  Time spent: 55 minutes  Recommendations for Outpatient Follow-up:  Needs renal panel magnesium in about 1 week post discharge as well as dialysis labs Recommend outpatient follow-up with orthopedics Dr. Sammuel Hines in about 4 to 5 days post discharge to follow-up--skilled facility to arrange See med changes including metoprolol dosage changes-May need to titrate upward or downward depending on heart rate and blood pressure May need palliative discussions in the outpatient setting if failing to thrive  Discharge Diagnoses:  MAIN problem for hospitalization   Hip fracture MAT paroxysmal A. 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 fib RVR in addition to possible MAT as well as new onset HFrEF resulting in cardiology consult Hospitalization complicated by dialysis access clotting in right arm and so TDC has to be placed in the next day or so to be able to discharge safely to skilled facility  Hospital Course:  Closed fracture left femur Status post left hip hemiarthroplasty Dr. Sammuel Hines 01/30/2021 Can be weightbearing on left leg-Staples removed 2 weeks out Cont tylenol for mild pain--Oxycodone 5 every 4 as needed pain  management Patient to SNF when all medical issues improved Poorly functioning fistula IR  fistulogram performed and stenosis was operated on unfortunately TDC needs to be replaced as access is not functioning and this will have to occur prior to patient is discharged to skilled facility ESRD TTS ?adjust EDW goals given hypotension?   given albumin during HD 10/15 Started this admit midodrine 5 mg tid and adjust as an outpatient New onset systolic heart failure Left bundle branch block /paroxysmal A. Fib with RVR EF this admission is 35-40% which is a reduction from prior Volume managed by HD currently euvolemic--given his frailty he is not really a great candidate for ischemic eval as an outpatient Flipped into rapid Afib/MAT 10/13 --paroxysmal--meds adjusted today to metoprolol 12.5 tid and he will continue on this medication in the outpatient setting Likely consolidate meds in the outpatient setting based on cardiology input Poor AC candidate  Leukocytosis NOS 2 view CXR shows only atelectasis Etiology of leukocytosis is unclear at this time Ileus postoperatively Pulled out NG--resolved 10/1 full solids today and is tolerating them BPH with urinary obstruction Flomax 0.4 Scalp laceration Continue mupirocin and foam dressing   Discharge Exam: Vitals:   02/10/21 0926 02/10/21 1025  BP: (!) 136/49 (!) 124/45  Pulse: 68 75  Resp: 15 17  Temp: 98 F (36.7 C) 98 F (36.7 C)  SpO2: 95% 99%    Subj on day of d/c   Slightly confused Had a long discussion with the patient's daughter at the bedside as well as with the wife and we talked a lot about his medical care and his decline-I mentioned to them that I think he may improve and some of  his reticence to do dialysis etc. may get better with time however it may not and in that case we can talk about palliative in the outpatient setting  General Exam on discharge  Slightly confused no distress seems tired of doing all the  dialysis related procedures S1-S2 seems to be in sinus rhythm Chest is clear no added sound no rales rhonchi No abdominal pain nausea vomiting no rebound no guarding Neurologically intact moving 4 limbs equally ROM intact Neurologically  Discharge Instructions   Discharge Instructions     Diet - low sodium heart healthy   Complete by: As directed    Increase activity slowly   Complete by: As directed    No wound care   Complete by: As directed       Allergies as of 02/10/2021   No Known Allergies      Medication List     STOP taking these medications    [facility] Crea   furosemide 40 MG tablet Commonly known as: LASIX   metoprolol succinate 50 MG 24 hr tablet Commonly known as: TOPROL-XL   Pfizer-BioNTech COVID-19 Vacc 30 MCG/0.3ML injection Generic drug: COVID-19 mRNA vaccine (Pfizer)       TAKE these medications    acetaminophen 650 MG CR tablet Commonly known as: TYLENOL Take 650 mg by mouth every 8 (eight) hours as needed for pain.   cetirizine 10 MG tablet Commonly known as: ZYRTEC Take 10 mg by mouth daily.   metoprolol tartrate 25 MG tablet Commonly known as: LOPRESSOR Take 0.5 tablets (12.5 mg total) by mouth 3 (three) times daily.   midodrine 5 MG tablet Commonly known as: PROAMATINE Take 1 tablet (5 mg total) by mouth 3 (three) times daily with meals.   oxyCODONE 5 MG immediate release tablet Commonly known as: Oxy IR/ROXICODONE Take 1 tablet (5 mg total) by mouth every 4 (four) hours as needed for moderate pain.   sevelamer carbonate 800 MG tablet Commonly known as: RENVELA Take 1 tablet (800 mg total) by mouth 3 (three) times daily with meals.   tamsulosin 0.4 MG Caps capsule Commonly known as: FLOMAX Take 1 capsule (0.4 mg total) by mouth daily after supper. What changed: when to take this               Durable Medical Equipment  (From admission, onward)           Start     Ordered   01/31/21 1730  For  home use only DME 3 n 1  Once        01/31/21 1733           No Known Allergies  Follow-up Information     Vanetta Mulders, MD Follow up in 2 week(s).   Specialty: Orthopedic Surgery Contact information: [address] [place] [address] [phone]         [facility] Follow up.   Specialty: Home Health Services Why: Will follow patient  post short term rehab. Contact information: [address] [place] [address] [phone]                  The results of significant diagnostics from this hospitalization (including imaging, microbiology, ancillary and laboratory) are listed below for reference.    Significant Diagnostic Studies: CT ABDOMEN PELVIS WO CONTRAST  Result Date: 02/02/2021 CLINICAL DATA:  Abdominal pain, distension EXAM: CT ABDOMEN AND PELVIS WITHOUT CONTRAST TECHNIQUE: Multidetector CT imaging of the abdomen and  pelvis was performed following the standard protocol without IV contrast. COMPARISON:  01/31/2021, 04/20/2020 FINDINGS: Lower chest: The heart is enlarged, with trace pericardial effusion. There are small bilateral pleural effusions, with minimal dependent lower lobe atelectasis. Hepatobiliary: No focal liver abnormality is seen. No gallstones, gallbladder wall thickening, or biliary dilatation. Pancreas: Unremarkable. No pancreatic ductal dilatation or surrounding inflammatory changes. Spleen: Normal in size without focal abnormality. Adrenals/Urinary Tract: Interval drainage or rupture of the large cyst off the lower pole left kidney. There is bilateral renal cortical atrophy unchanged. No urinary tract calculi or obstructive uropathy. Evaluation of the bladder is limited by streak artifact from left hip arthroplasty. The adrenals are unremarkable. Stomach/Bowel: There is diffuse gaseous distension of the stomach, small bowel, and colon. No evidence of obstruction. Findings could reflect ileus. There is  scattered diverticulosis throughout the colon without evidence of acute diverticulitis. The appendix, if still present, is not well visualized. Vascular/Lymphatic: Aortic atherosclerosis. No enlarged abdominal or pelvic lymph nodes. Reproductive: Prostate is enlarged, evaluation limited by left hip arthroplasty. Other: No free fluid or free intraperitoneal gas. No abdominal wall hernia. Musculoskeletal: Unremarkable left hip hemiarthroplasty. No acute or destructive bony lesions. Reconstructed images demonstrate no additional findings. IMPRESSION: 1. Diffuse gaseous distention of the bowel consistent with ileus. 2. Small bilateral pleural effusions. 3. Cardiomegaly with trace pericardial effusion. 4. Diffuse colonic diverticulosis without diverticulitis. 5.  Aortic Atherosclerosis (ICD10-I70.0). Electronically Signed   By: Randa Ngo M.D.   On: 02/02/2021 16:13   DG Chest 1 View  Result Date: 01/29/2021 CLINICAL DATA:  87 year old male status post fall last night. EXAM: CHEST  1 VIEW COMPARISON:  Chest radiographs 04/21/2020 and earlier. FINDINGS: Portable AP supine views at 0903 hours. Chronic cardiomegaly appears stable. Calcified aortic atherosclerosis. Other mediastinal contours are within normal limits. Visualized tracheal air column is within normal limits. Increased pulmonary vascularity, slightly greater on the right. Trace fluid in the right minor fissure now, but no other pleural effusion. No pneumothorax or confluent pulmonary opacity. No acute osseous abnormality identified. IMPRESSION: 1. Chronic cardiomegaly with increased pulmonary vascular congestion. Consider mild or developing interstitial edema. 2. No other acute cardiopulmonary abnormality. Electronically Signed   By: Genevie Ann M.D.   On: 01/29/2021 09:52   DG Chest 2 View  Result Date: 02/04/2021 CLINICAL DATA:  Pneumonia EXAM: CHEST - 2 VIEW COMPARISON:  02/01/2021 FINDINGS: Heart size upper normal. Negative for heart failure.  Atherosclerotic calcification aortic arch. Improved aeration in the left lung base. Right lung remains clear. No pleural effusion. Calcified granuloma left upper lobe unchanged. IMPRESSION: Improving left lower lobe atelectasis. Electronically Signed   By: Franchot Gallo M.D.   On: 02/04/2021 14:53   CT HEAD WO CONTRAST (5MM)  Result Date: 01/29/2021 CLINICAL DATA:  Fall today. EXAM: CT HEAD WITHOUT CONTRAST CT CERVICAL SPINE WITHOUT CONTRAST TECHNIQUE: Multidetector CT imaging of the head and cervical spine was performed following the standard protocol without intravenous contrast. Multiplanar CT image reconstructions of the cervical spine were also generated. COMPARISON:  None. FINDINGS: CT HEAD FINDINGS Brain: Mild diffuse cortical atrophy is noted. No mass effect or midline shift is noted. Ventricular size is within normal limits. There is no evidence of mass lesion, hemorrhage or acute infarction. Vascular: No hyperdense vessel or unexpected calcification. Skull: Normal. Negative for fracture or focal lesion. Sinuses/Orbits: No acute finding. Other: None. CT CERVICAL SPINE FINDINGS Alignment: Mild grade 1 anterolisthesis of C4-5 is noted secondary to posterior facet joint hypertrophy. Skull base and vertebrae: No  acute fracture. No primary bone lesion or focal pathologic process. Soft tissues and spinal canal: No prevertebral fluid or swelling. No visible canal hematoma. Disc levels: Moderate degenerative disc disease is noted at C5-6 and C6-7 and C7-T1. Upper chest: Negative. Other: None. IMPRESSION: No acute intracranial abnormality seen. Moderate multilevel degenerative disc disease. No acute abnormality seen in the cervical spine. Electronically Signed   By: Marijo Conception M.D.   On: 01/29/2021 10:38   CT Cervical Spine Wo Contrast  Result Date: 01/29/2021 CLINICAL DATA:  Fall today. EXAM: CT HEAD WITHOUT CONTRAST CT CERVICAL SPINE WITHOUT CONTRAST TECHNIQUE: Multidetector CT imaging of the head  and cervical spine was performed following the standard protocol without intravenous contrast. Multiplanar CT image reconstructions of the cervical spine were also generated. COMPARISON:  None. FINDINGS: CT HEAD FINDINGS Brain: Mild diffuse cortical atrophy is noted. No mass effect or midline shift is noted. Ventricular size is within normal limits. There is no evidence of mass lesion, hemorrhage or acute infarction. Vascular: No hyperdense vessel or unexpected calcification. Skull: Normal. Negative for fracture or focal lesion. Sinuses/Orbits: No acute finding. Other: None. CT CERVICAL SPINE FINDINGS Alignment: Mild grade 1 anterolisthesis of C4-5 is noted secondary to posterior facet joint hypertrophy. Skull base and vertebrae: No acute fracture. No primary bone lesion or focal pathologic process. Soft tissues and spinal canal: No prevertebral fluid or swelling. No visible canal hematoma. Disc levels: Moderate degenerative disc disease is noted at C5-6 and C6-7 and C7-T1. Upper chest: Negative. Other: None. IMPRESSION: No acute intracranial abnormality seen. Moderate multilevel degenerative disc disease. No acute abnormality seen in the cervical spine. Electronically Signed   By: Marijo Conception M.D.   On: 01/29/2021 10:38   IR US Guide Vasc Access Right  Result Date: 02/09/2021 INDICATION: 87 year old male with reported history of high arterial pressure and clot within the cannula at serial hemodialysis session. EXAM: ULTRASOUND-GUIDED ACCESS X2 RIGHT UPPER EXTREMITY BC FISTULA FISTULAGRAM BALLOON ANGIOPLASTY OF THE PROXIMAL VENOUS OUTFLOW MEDICATIONS: 3000 UNITS HEPARIN 1 MG VERSED, 50 MCG FENTANYL ANESTHESIA/SEDATION: Moderate Sedation Time:  46 minutes The patient was continuously monitored during the procedure by the interventional radiology nurse under my direct supervision. FLUOROSCOPY TIME:  Fluoroscopy Time: 4 minutes 6 seconds (9 mGy). COMPLICATIONS: None PROCEDURE: Informed written consent was  obtained from the patient after a thorough discussion of the procedural risks, benefits and alternatives. All questions were addressed. Maximal Sterile Barrier Technique was utilized including caps, mask, sterile gowns, sterile gloves, sterile drape, hand hygiene and skin antiseptic. A timeout was performed prior to the initiation of the procedure. Ultrasound was performed of the right upper extremity. 1% lidocaine was used for local anesthesia. Ultrasound guidance was used for micro puncture access directed centrally. Angiogram was performed of the venous outflow. Reflux images were then performed. Given the appearance of the proximal venous outflow, we planned angioplasty of the venous outflow. Ultrasound-guided access was then required directed peripherally towards the arteriovenous anastomosis. 1% lidocaine was used for local anesthesia. Micropuncture access was achieved and a microwire was placed. A Bentson wire was passed into the venous access. A 7 French sheath was then placed over the Bentson wire. Kumpe the catheter was navigated with the use of a Glidewire into the arterial side of the arteriovenous anastomosis for repeat imaging. Balloon angioplasty was then performed of the arteriovenous anastomosis 6 mm, and then of the proximal venous outflow up the 8 mm. Repeat fistulagram was performed demonstrating resolution of the stenosis in the  proximal venous outflow with 0% residual. All catheters wires and sheaths were removed with placement of 2 stay sutures. External and thrill was palpated at the arteriovenous anastomosis. Patient tolerated the procedure well and remained hemodynamically stable throughout. No complications were encountered and no significant blood loss. IMPRESSION: Status post ultrasound-guided access of right upper extremity radiocephalic fistula at 2 sites with balloon angioplasty of the arteriovenous anastomosis to 6 mm, and the proximal venous outflow to a 8 mm. Signed, Dulcy Fanny.  Dellia Nims, RPVI [facility] ACCESS: If there is further problems with the fistula, recommend duplex, as the given history of high arterial pressures does not correlate with the findings of the fistulagram, as there was no downstream venous outflow stenosis perceived, only a venous outflow stenosis just beyond the AV anastomosis. Electronically Signed   By: Corrie Mckusick D.O.   On: 02/09/2021 17:44   DG CHEST PORT 1 VIEW  Result Date: 02/01/2021 CLINICAL DATA:  Follow-up exam. Patient has a history of heart murmur and hypertension. EXAM: PORTABLE CHEST 1 VIEW COMPARISON:  January 29, 2021 FINDINGS: The heart size and mediastinal contours are stable. The heart size enlarged. Central pulmonary vascular congestion is identified slightly improved compared prior exam. The visualized skeletal structures are stable. IMPRESSION: Congestive heart failure slightly improved compared prior exam. Electronically Signed   By: Abelardo Diesel M.D.   On: 02/01/2021 09:25   DG Abd Portable 1V  Result Date: 02/03/2021 CLINICAL DATA:  Ileus EXAM: PORTABLE ABDOMEN - 1 VIEW COMPARISON:  CT 02/02/2021 FINDINGS: Diffuse gaseous distension of the stomach, small bowel, and colon. No identifiable transition. No supine evidence of free air. No acute osseous abnormality. Left hip arthroplasty. IMPRESSION: Persistent diffuse gaseous distension of bowel, consistent with adynamic ileus. Electronically Signed   By: Maurine Simmering M.D.   On: 02/03/2021 14:23   DG Abd Portable 1V  Result Date: 01/31/2021 CLINICAL DATA:  Abdominal distension EXAM: PORTABLE ABDOMEN - 1 VIEW COMPARISON:  Portable exam 1230 hours compared to CT abdomen and pelvis 04/20/2020 FINDINGS: Air-filled loops of large and small bowel throughout abdomen, with small-bowel loops upper normal caliber. No definite evidence of obstruction or wall thickening. Bones demineralized with degenerative changes of thoracolumbar  spine and note of a LEFT hip prosthesis. IMPRESSION: Nonspecific bowel gas pattern with air-filled nondistended loops of bowel throughout abdomen. Electronically Signed   By: Lavonia Dana M.D.   On: 01/31/2021 14:22   IR AV DIALY SHUNT INTRO NEEDLE/INTRACATH INITIAL W/PTA/IMG RIGHT  Result Date: 02/09/2021 INDICATION: 87 year old male with reported history of high arterial pressure and clot within the cannula at serial hemodialysis session. EXAM: ULTRASOUND-GUIDED ACCESS X2 RIGHT UPPER EXTREMITY BC FISTULA FISTULAGRAM BALLOON ANGIOPLASTY OF THE PROXIMAL VENOUS OUTFLOW MEDICATIONS: 3000 UNITS HEPARIN 1 MG VERSED, 50 MCG FENTANYL ANESTHESIA/SEDATION: Moderate Sedation Time:  46 minutes The patient was continuously monitored during the procedure by the interventional radiology nurse under my direct supervision. FLUOROSCOPY TIME:  Fluoroscopy Time: 4 minutes 6 seconds (9 mGy). COMPLICATIONS: None PROCEDURE: Informed written consent was obtained from the patient after a thorough discussion of the procedural risks, benefits and alternatives. All questions were addressed. Maximal Sterile Barrier Technique was utilized including caps, mask, sterile gowns, sterile gloves, sterile drape, hand hygiene and skin antiseptic. A timeout was performed prior to the initiation of the procedure. Ultrasound was performed of the right upper extremity. 1% lidocaine was used for local anesthesia. Ultrasound guidance was used for micro puncture access directed centrally. Angiogram was  performed of the venous outflow. Reflux images were then performed. Given the appearance of the proximal venous outflow, we planned angioplasty of the venous outflow. Ultrasound-guided access was then required directed peripherally towards the arteriovenous anastomosis. 1% lidocaine was used for local anesthesia. Micropuncture access was achieved and a microwire was placed. A Bentson wire was passed into the venous access. A 7 French sheath was then placed  over the Bentson wire. Kumpe the catheter was navigated with the use of a Glidewire into the arterial side of the arteriovenous anastomosis for repeat imaging. Balloon angioplasty was then performed of the arteriovenous anastomosis 6 mm, and then of the proximal venous outflow up the 8 mm. Repeat fistulagram was performed demonstrating resolution of the stenosis in the proximal venous outflow with 0% residual. All catheters wires and sheaths were removed with placement of 2 stay sutures. External and thrill was palpated at the arteriovenous anastomosis. Patient tolerated the procedure well and remained hemodynamically stable throughout. No complications were encountered and no significant blood loss. IMPRESSION: Status post ultrasound-guided access of right upper extremity radiocephalic fistula at 2 sites with balloon angioplasty of the arteriovenous anastomosis to 6 mm, and the proximal venous outflow to a 8 mm. Signed, Dulcy Fanny. Dellia Nims, RPVI [facility] ACCESS: If there is further problems with the fistula, recommend duplex, as the given history of high arterial pressures does not correlate with the findings of the fistulagram, as there was no downstream venous outflow stenosis perceived, only a venous outflow stenosis just beyond the AV anastomosis. Electronically Signed   By: Corrie Mckusick D.O.   On: 02/09/2021 17:44   ECHOCARDIOGRAM COMPLETE  Result Date: 02/01/2021    ECHOCARDIOGRAM REPORT   Patient Name:   Jeffrey Payne Date of Exam: 02/01/2021 Medical Rec #:  [id number]      Height:       67.0 in Accession #:    [id number]     Weight:       110.2 lb Date of Birth:  [birth year]      BSA:          1.570 m Patient Age:    87 years       BP:           122/55 mmHg Patient Gender: M              HR:           85 bpm. Exam Location:  Inpatient Procedure: 2D Echo, Cardiac Doppler, Color Doppler and 3D Echo Indications:    Atrial Fibrillation I48.91   History:        Patient has prior history of Echocardiogram examinations, most                 recent 04/21/2020. Aortic Valve Disease; Arrythmias:LBBB. End                 stage renal disease. Acute on chronic diastolic CHF. Multifocal                 atrial tachycardia. Anemia of chronic kidney failure. Closed                 fracture of neck of left femur.  Sonographer:    Darlina Sicilian RDCS Referring Phys: [id number] [place]  1. Left ventricular ejection fraction, by estimation, is 35 to 40%. Left ventricular ejection fraction by 3D volume is 40 %. The left ventricle has moderately decreased function. The left  ventricle demonstrates global hypokinesis. Left ventricular diastolic parameters are consistent with Grade I diastolic dysfunction (impaired relaxation). Elevated left atrial pressure.  2. Right ventricular systolic function is normal. The right ventricular size is normal. Tricuspid regurgitation signal is inadequate for assessing PA pressure.  3. Left atrial size was moderately dilated.  4. The mitral valve is normal in structure. Trivial mitral valve regurgitation.  5. The aortic valve is tricuspid. There is mild calcification of the aortic valve. There is moderate thickening of the aortic valve. Aortic valve regurgitation is moderate. Mild to moderate aortic valve sclerosis/calcification is present, without any evidence of aortic stenosis.  6. The inferior vena cava is normal in size with greater than 50% respiratory variability, suggesting right atrial pressure of 3 mmHg. Comparison(s): Prior images reviewed side by side. The left ventricular function is worsened. FINDINGS  Left Ventricle: Left ventricular ejection fraction, by estimation, is 35 to 40%. Left ventricular ejection fraction by 3D volume is 40 %. The left ventricle has moderately decreased function. The left ventricle demonstrates global hypokinesis. The left ventricular internal cavity size was normal in size. There is no  left ventricular hypertrophy. Abnormal (paradoxical) septal motion, consistent with left bundle branch block. Left ventricular diastolic parameters are consistent with Grade I diastolic dysfunction (impaired relaxation). Elevated left atrial pressure. Right Ventricle: The right ventricular size is normal. No increase in right ventricular wall thickness. Right ventricular systolic function is normal. Tricuspid regurgitation signal is inadequate for assessing PA pressure. Left Atrium: Left atrial size was moderately dilated. Right Atrium: Right atrial size was normal in size. Pericardium: There is no evidence of pericardial effusion. Mitral Valve: The mitral valve is normal in structure. Mild mitral annular calcification. Trivial mitral valve regurgitation. Tricuspid Valve: The tricuspid valve is normal in structure. Tricuspid valve regurgitation is not demonstrated. Aortic Valve: The aortic valve is tricuspid. There is mild calcification of the aortic valve. There is moderate thickening of the aortic valve. Aortic valve regurgitation is moderate. Mild to moderate aortic valve sclerosis/calcification is present, without any evidence of aortic stenosis. Pulmonic Valve: The pulmonic valve was not well visualized. Pulmonic valve regurgitation is mild to moderate. Aorta: The aortic root and ascending aorta are structurally normal, with no evidence of dilitation. Venous: The inferior vena cava is normal in size with greater than 50% respiratory variability, suggesting right atrial pressure of 3 mmHg. IAS/Shunts: No atrial level shunt detected by color flow Doppler.  LEFT VENTRICLE PLAX 2D LVIDd:         5.50 cm         Diastology LVIDs:         4.40 cm         LV e' medial:    3.30 cm/s LV PW:         0.90 cm         LV E/e' medial:  16.2 LV IVS:        1.10 cm         LV e' lateral:   2.93 cm/s LVOT diam:     2.00 cm         LV E/e' lateral: 18.3 LV SV:         60 LV SV Index:   38 LVOT Area:     3.14 cm        3D Volume  EF  LV 3D EF:    Left                                             ventricul LV Volumes (MOD)                            ar LV vol s, MOD    48.5 ml                    ejection A2C:                                        fraction                                             by 3D                                             volume is                                             40 %.                                 3D Volume EF:                                3D EF:        40 %                                LV EDV:       189 ml                                LV ESV:       113 ml                                LV SV:        76 ml RIGHT VENTRICLE RV S prime:     10.30 cm/s TAPSE (M-mode): 1.4 cm LEFT ATRIUM           Index        RIGHT ATRIUM           Index LA diam:      4.80 cm 3.06 cm/m   RA Area:     10.60 cm LA Vol (A4C): 36.4 ml 23.15 ml/m  RA Volume:   17.70 ml  11.27 ml/m  AORTIC VALVE LVOT Vmax:   105.00 cm/s LVOT Vmean:  63.500 cm/s LVOT VTI:    0.192 m  AORTA Ao Root diam: 3.60 cm Ao Asc diam:  3.60 cm MITRAL VALVE MV Area (PHT): 2.56 cm    SHUNTS MV Decel Time: 296 msec    Systemic VTI:  0.19 m MV E velocity: 53.50 cm/s  Systemic Diam: 2.00 cm MV A velocity: 96.40 cm/s MV E/A ratio:  0.55 Mihai Croitoru MD Electronically signed by Sanda Klein MD Signature Date/Time: 02/01/2021/2:21:26 PM    Final    DG HIP UNILAT WITH PELVIS 1V LEFT  Result Date: 01/30/2021 CLINICAL DATA:  Left hip hemiarthroplasty EXAM: DG HIP (WITH OR WITHOUT PELVIS) 1V*L* COMPARISON:  01/29/2021 FINDINGS: Single frontal view of the left hip demonstrates interval placement of a left hip hemiarthroplasty in the expected position without signs of acute complication. Postsurgical changes in the soft tissues. IMPRESSION: 1. Unremarkable left hip hemiarthroplasty. Electronically Signed   By: Randa Ngo M.D.   On: 01/30/2021 19:07   DG Hip Unilat W or Wo Pelvis 2-3 Views Left  Result Date:  01/29/2021 CLINICAL DATA:  Fall. EXAM: DG HIP (WITH OR WITHOUT PELVIS) 2-3V LEFT COMPARISON:  None. FINDINGS: Acute, impacted fracture deformity involves the femoral neck. There is medial angulation of the distal fracture fragments. No dislocation. Right hip is intact. IMPRESSION: Acute, impacted fracture of the left femoral neck. Electronically Signed   By: Kerby Moors M.D.   On: 01/29/2021 09:54    Microbiology: Recent Results (from the past 240 hour(s))  Resp Panel by RT-PCR (Flu A&B, Covid) Nasopharyngeal Swab     Status: None   Collection Time: 02/10/21 12:54 PM   Specimen: Nasopharyngeal Swab; Nasopharyngeal(NP) swabs in vial transport medium  Result Value Ref Range Status   SARS Coronavirus 2 by RT PCR NEGATIVE NEGATIVE Final    Comment: (NOTE) SARS-CoV-2 target nucleic acids are NOT DETECTED.  The SARS-CoV-2 RNA is generally detectable in upper respiratory specimens during the acute phase of infection. The lowest concentration of SARS-CoV-2 viral copies this assay can detect is 138 copies/mL. A negative result does not preclude SARS-Cov-2 infection and should not be used as the sole basis for treatment or other patient management decisions. A negative result may occur with  improper specimen collection/handling, submission of specimen other than nasopharyngeal swab, presence of viral mutation(s) within the areas targeted by this assay, and inadequate number of viral copies(<138 copies/mL). A negative result must be combined with clinical observations, patient history, and epidemiological information. The expected result is Negative.  Fact Sheet for Patients:  EntrepreneurPulse.com.au  Fact Sheet for Healthcare Providers:  IncredibleEmployment.be  This test is no t yet approved or cleared by the [place] FDA and  has been authorized for detection and/or diagnosis of SARS-CoV-2 by FDA under an Emergency Use Authorization (EUA). This  EUA will remain  in effect (meaning this test can be used) for the duration of the COVID-19 declaration under Section 564(b)(1) of the Act, 21 U.S.C.section 360bbb-3(b)(1), unless the authorization is terminated  or revoked sooner.       Influenza A by PCR NEGATIVE NEGATIVE Final   Influenza B by PCR NEGATIVE NEGATIVE Final    Comment: (NOTE) The Xpert Xpress SARS-CoV-2/FLU/RSV plus assay is intended as an aid in the diagnosis of influenza from Nasopharyngeal swab specimens and should not be used as a sole basis for treatment. Nasal washings and aspirates are unacceptable for Xpert Xpress SARS-CoV-2/FLU/RSV testing.  Fact Sheet for Patients: EntrepreneurPulse.com.au  Fact Sheet for Healthcare Providers: IncredibleEmployment.be  This test is not yet approved or cleared by the [place]  States FDA and has been authorized for detection and/or diagnosis of SARS-CoV-2 by FDA under an Emergency Use Authorization (EUA). This EUA will remain in effect (meaning this test can be used) for the duration of the COVID-19 declaration under Section 564(b)(1) of the Act, 21 U.S.C. section 360bbb-3(b)(1), unless the authorization is terminated or revoked.  Performed at [facility], [place] [address] [place], [place] [address]      Labs: Basic Metabolic Panel: Recent Labs  Lab 02/06/21 0248 02/07/21 0230 02/08/21 0217 02/09/21 0332 02/10/21 0304 02/10/21 0822  NA 137 136 136 132* 131* 134*  K 3.5 4.0 3.7 4.1 4.3 4.4  CL 99 94* 97* 92* 94* 94*  CO2 27 30 28 27 26 26   GLUCOSE 126* 118* 110* 106* 115* 101*  BUN 26* 46* 29* 46* 59* 60*  CREATININE 3.74* 5.21* 3.62* 5.05* 6.15* 6.50*  CALCIUM 9.0 9.3 9.1 9.4 9.4 9.4  MG 2.1  --   --   --   --   --   PHOS 4.1 4.8* 4.0 4.9* 5.5* 5.4*   Liver Function Tests: Recent Labs  Lab 02/07/21 0230 02/08/21 0217 02/09/21 0332 02/10/21 0304 02/10/21 0822  ALBUMIN 2.4* 2.5* 2.6* 2.6* 2.6*   No results  for input(s): LIPASE, AMYLASE in the last 168 hours. No results for input(s): AMMONIA in the last 168 hours. CBC: Recent Labs  Lab 02/04/21 0133 02/05/21 0323 02/07/21 0631 02/09/21 0332 02/10/21 0822  WBC 15.4* 14.7* 14.5* 13.0* 20.5*  NEUTROABS  --  11.4*  --  8.4*  --   HGB 10.8* 10.2* 10.0* 9.7* 9.3*  HCT 32.9* 30.8* 30.8* 30.4* 28.5*  MCV 98.5 97.8 98.7 99.3 98.6  PLT 250 272 344 347 409*   Cardiac Enzymes: No results for input(s): CKTOTAL, CKMB, CKMBINDEX, TROPONINI in the last 168 hours. BNP: BNP (last 3 results) Recent Labs    04/20/20 1819  BNP 2,566.7*    ProBNP (last 3 results) No results for input(s): PROBNP in the last 8760 hours.  CBG: Recent Labs  Lab 02/06/21 1551  GLUCAP 132*       Signed:  Nita Sells MD   [facility] 02/10/2021, 4:59 PM

## 2021-02-11 ENCOUNTER — Inpatient Hospital Stay (HOSPITAL_COMMUNITY): Payer: Medicare Other

## 2021-02-11 DIAGNOSIS — E43 Unspecified severe protein-calorie malnutrition: Secondary | ICD-10-CM | POA: Insufficient documentation

## 2021-02-11 DIAGNOSIS — S72002A Fracture of unspecified part of neck of left femur, initial encounter for closed fracture: Secondary | ICD-10-CM | POA: Diagnosis not present

## 2021-02-11 HISTORY — PX: IR US GUIDE VASC ACCESS RIGHT: IMG2390

## 2021-02-11 HISTORY — PX: IR FLUORO GUIDE CV LINE RIGHT: IMG2283

## 2021-02-11 LAB — RENAL FUNCTION PANEL
Albumin: 2.6 g/dL — ABNORMAL LOW (ref 3.5–5.0)
Anion gap: 16 — ABNORMAL HIGH (ref 5–15)
BUN: 60 mg/dL — ABNORMAL HIGH (ref 8–23)
CO2: 21 mmol/L — ABNORMAL LOW (ref 22–32)
Calcium: 9 mg/dL (ref 8.9–10.3)
Chloride: 92 mmol/L — ABNORMAL LOW (ref 98–111)
Creatinine, Ser: 6.49 mg/dL — ABNORMAL HIGH (ref 0.61–1.24)
GFR, Estimated: 8 mL/min — ABNORMAL LOW (ref >60)
Glucose, Bld: 87 mg/dL (ref 70–99)
Phosphorus: 5.5 mg/dL — ABNORMAL HIGH (ref 2.5–4.6)
Potassium: 4.4 mmol/L (ref 3.5–5.1)
Sodium: 129 mmol/L — ABNORMAL LOW (ref 135–145)

## 2021-02-11 MED ORDER — FENTANYL CITRATE (PF) 100 MCG/2ML IJ SOLN
INTRAMUSCULAR | Status: AC
  Filled 2021-02-11: qty 2

## 2021-02-11 MED ORDER — CEFAZOLIN SODIUM-DEXTROSE 2-4 GM/100ML-% IV SOLN
INTRAVENOUS | Status: AC
  Filled 2021-02-11: qty 100

## 2021-02-11 MED ORDER — LIDOCAINE HCL (PF) 1 % IJ SOLN
INTRAMUSCULAR | Status: DC | PRN
  Administered 2021-02-11: 5 mL

## 2021-02-11 MED ORDER — CEFAZOLIN SODIUM-DEXTROSE 2-4 GM/100ML-% IV SOLN
INTRAVENOUS | Status: DC | PRN
  Administered 2021-02-11: 2 g via INTRAVENOUS

## 2021-02-11 MED ORDER — OXYCODONE HCL 5 MG PO TABS: 5.0000 mg | ORAL_TABLET | Freq: Two times a day (BID) | ORAL | 0 refills | Status: AC | PRN

## 2021-02-11 MED ORDER — MIDAZOLAM HCL 2 MG/2ML IJ SOLN
INTRAMUSCULAR | Status: AC
  Filled 2021-02-11: qty 2

## 2021-02-11 MED ORDER — HEPARIN SODIUM (PORCINE) 1000 UNIT/ML IJ SOLN
INTRAMUSCULAR | Status: AC
  Filled 2021-02-11: qty 1

## 2021-02-11 MED ORDER — HEPARIN SODIUM (PORCINE) 1000 UNIT/ML IJ SOLN
INTRAMUSCULAR | Status: AC
  Filled 2021-02-11: qty 4

## 2021-02-11 MED ORDER — LIDOCAINE HCL 1 % IJ SOLN
INTRAMUSCULAR | Status: AC
  Filled 2021-02-11: qty 20

## 2021-02-11 NOTE — Progress Notes (Signed)
   Subjective:  Pain well controlled today from left hip perspective. Staples removed today. Continues to work with PT as best able.  Objective:   VITALS:   Vitals:   02/10/21 0923 02/10/21 0926 02/10/21 1025 02/10/21 2137  BP: (!) 119/52 (!) 136/49 (!) 124/45 (!) 117/42  Pulse: 74 68 75 74  Resp:  15 17 18   Temp:  98 F (36.7 C) 98 F (36.7 C) 98.1 F (36.7 C)  TempSrc:  Oral Oral Oral  SpO2:  95% 99% 97%  Weight:  50.4 kg    Height:        Neurologically intact Neurovascular intact Sensation intact distally Intact pulses distally Dorsiflexion/Plantar flexion intact   Lab Results  Component Value Date   WBC 20.5 (H) 02/10/2021   HGB 9.3 (L) 02/10/2021   HCT 28.5 (L) 02/10/2021   MCV 98.6 02/10/2021   PLT 409 (H) 02/10/2021     Assessment/Plan:  12 Days Post-Op   - Patient to continue to work with PT/OT to optimize mobilization safely - DVT ppx - SCDs, ambulation, Heparin 5000u TID - WBAT operative extremity - Pain control - multimodal pain management, ATC acetaminophen in conjunction with as needed narcotic (oxycodone), although this should be minimized with other modalities    Destiny Trickey 02/11/2021, 7:51 AM

## 2021-02-11 NOTE — Progress Notes (Signed)
Pt being d/c to snf today. Spoke to [facility] to advise them of pt's d/c to [facility] and pt will resume tomorrow. Clinic confirms pt's schedule of TTS. Pt arrives at 11:30 for 11:45.   Melven Sartorius Renal Navigator [phone]

## 2021-02-11 NOTE — Progress Notes (Signed)
PT Cancellation Note  Patient Details Name: Jeffrey Payne MRN: [id number] DOB: 1933/01/23   Cancelled Treatment:    Reason Eval/Treat Not Completed: Patient at procedure or test/unavailable (Pt in interventional radiology and then to HD.  Will check back tomorrow.)   [name] 02/11/2021, 10:14 AM [name] M,PT Acute Rehab Services [phone] (pager)

## 2021-02-11 NOTE — Progress Notes (Signed)
PT Cancellation Note  Patient Details Name: Jeffrey Payne MRN: [id number] DOB: 08-Aug-1932   Cancelled Treatment:    Reason Eval/Treat Not Completed: Other (comment) (Refused due to sleepy.)   [name] 02/11/2021, 10:59 AM Dorianne Perret M,PT Acute Rehab Services [phone] (pager)

## 2021-02-11 NOTE — Discharge Summary (Addendum)
Physician Discharge Summary  Mayes Sangiovanni YQM:[id number] DOB: Aug 01, 1932 DOA: 01/29/2021  PCP: Kristen Loader, FNP  Admit date: 01/29/2021 Discharge date: 02/11/2021  Time spent: 55 minutes  Recommendations for Outpatient Follow-up:  Needs renal panel in about 1 week post discharge as well as dialysis labs Recommend outpatient follow-up with orthopedics Dr. Sammuel Hines in about 4 to 5 days post discharge to follow-up--skilled facility to arrange See med changes including metoprolol dosage changes-May need to titrate upward or downward depending on heart rate and blood pressure May need palliative discussions in the outpatient setting if failing to thrive  Discharge Diagnoses:  MAIN problem for hospitalization   Hip fracture MAT paroxysmal A. 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 fib RVR in addition to possible MAT as well as new onset HFrEF resulting in cardiology consult Hospitalization complicated by dialysis access clotting in right arm and so TDC has to be placed in the next day or so to be able to discharge safely to skilled facility  Hospital Course:  Closed fracture left femur Status post left hip hemiarthroplasty Dr. Sammuel Hines 01/30/2021 Can be weightbearing on left leg-Staples removed 2 weeks out Cont tylenol for mild pain--Oxycodone 5 every 4 as needed pain  management Patient to SNF when all medical issues improved Poorly functioning fistula IR  fistulogram performed and stenosis was operated on unfortunately TDC needs to be replaced as access is not functioning and this will have to occur prior to patient is discharged to skilled facility ESRD TTS ?adjust EDW goals given hypotension?   given albumin during HD 10/15 Started this admit midodrine 5 mg tid and adjust as an outpatient New onset systolic heart failure Left bundle branch block /paroxysmal A. Fib with RVR EF this admission is 35-40% which is a reduction from prior Volume managed by HD currently euvolemic--given his frailty he is not really a great candidate for ischemic eval as an outpatient Flipped into rapid Afib/MAT 10/13 --paroxysmal--meds adjusted today to metoprolol 12.5 tid and he will continue on this medication in the outpatient setting Likely consolidate meds in the outpatient setting based on cardiology input Poor AC candidate  Leukocytosis NOS 2 view CXR shows only atelectasis Etiology of leukocytosis is unclear at this time Ileus postoperatively Pulled out NG--resolved 10/1 full solids today and is tolerating them BPH with urinary obstruction Flomax 0.4 Scalp laceration Continue mupirocin and foam dressing   Discharge Exam: Vitals:   02/11/21 1500 02/11/21 1620  BP: (!) 111/35 (!) 108/33  Pulse:  81  Resp: 13 14  Temp:  98.1 F (36.7 C)  SpO2:  96%    Subj on day of d/c   Seen and examined at bedside.  He has no new complaints.  General Exam on discharge  Slightly confused no distress seems tired of doing all the dialysis related procedures S1-S2 seems to be in sinus rhythm Chest is clear no added sound no rales rhonchi No  abdominal pain nausea vomiting no rebound no guarding Neurologically intact moving 4 limbs equally ROM intact Neurologically  Discharge Instructions   Discharge Instructions     Diet - low sodium heart healthy   Complete  by: As directed    Increase activity slowly   Complete by: As directed    No wound care   Complete by: As directed       Allergies as of 02/11/2021   No Known Allergies      Medication List     STOP taking these medications    [facility] Crea   furosemide 40 MG tablet Commonly known as: LASIX   metoprolol succinate 50 MG 24 hr tablet Commonly known as: TOPROL-XL   Pfizer-BioNTech COVID-19 Vacc 30 MCG/0.3ML injection Generic drug: COVID-19 mRNA vaccine (Pfizer)       TAKE these medications    acetaminophen 650 MG CR tablet Commonly known as: TYLENOL Take 650 mg by mouth every 8 (eight) hours as needed for pain.   cetirizine 10 MG tablet Commonly known as: ZYRTEC Take 10 mg by mouth daily.   metoprolol tartrate 25 MG tablet Commonly known as: LOPRESSOR Take 0.5 tablets (12.5 mg total) by mouth 3 (three) times daily.   midodrine 5 MG tablet Commonly known as: PROAMATINE Take 1 tablet (5 mg total) by mouth 3 (three) times daily with meals.   oxyCODONE 5 MG immediate release tablet Commonly known as: Oxy IR/ROXICODONE Take 1 tablet (5 mg total) by mouth 2 (two) times daily as needed for up to 3 days for severe pain.   sevelamer carbonate 800 MG tablet Commonly known as: RENVELA Take 1 tablet (800 mg total) by mouth 3 (three) times daily with meals.   tamsulosin 0.4 MG Caps capsule Commonly known as: FLOMAX Take 1 capsule (0.4 mg total) by mouth daily after supper. What changed: when to take this               Durable Medical Equipment  (From admission, onward)           Start     Ordered   01/31/21 1730  For home use only DME 3 n 1  Once        01/31/21 1733           No Known Allergies  Follow-up Information     Vanetta Mulders, MD Follow up in 2 week(s).   Specialty: Orthopedic Surgery Contact information: [address] [place] [address] [phone]         [facility] Follow up.    Specialty: Home Health Services Why: Will follow patient  post short term rehab. Contact information: [address] [place] [address] [phone]                  The results of significant diagnostics from this hospitalization (including imaging, microbiology, ancillary and laboratory) are listed below for reference.    Significant Diagnostic Studies: CT ABDOMEN PELVIS WO CONTRAST  Result Date: 02/02/2021 CLINICAL DATA:  Abdominal pain, distension EXAM: CT ABDOMEN AND PELVIS WITHOUT CONTRAST TECHNIQUE: Multidetector CT imaging of the abdomen and pelvis was performed following the standard protocol without IV contrast. COMPARISON:  01/31/2021, 04/20/2020 FINDINGS: Lower chest: The heart is enlarged, with trace pericardial effusion. There are small bilateral pleural effusions, with minimal dependent lower lobe atelectasis. Hepatobiliary: No focal liver abnormality is seen. No gallstones, gallbladder wall thickening, or biliary dilatation. Pancreas: Unremarkable. No pancreatic ductal dilatation or  surrounding inflammatory changes. Spleen: Normal in size without focal abnormality. Adrenals/Urinary Tract: Interval drainage or rupture of the large cyst off the lower pole left kidney. There is bilateral renal cortical atrophy unchanged. No urinary tract calculi or obstructive uropathy. Evaluation of the bladder is limited by streak artifact from left hip arthroplasty. The adrenals are unremarkable. Stomach/Bowel: There is diffuse gaseous distension of the stomach, small bowel, and colon. No evidence of obstruction. Findings could reflect ileus. There is scattered diverticulosis throughout the colon without evidence of acute diverticulitis. The appendix, if still present, is not well visualized. Vascular/Lymphatic: Aortic atherosclerosis. No enlarged abdominal or pelvic lymph nodes. Reproductive: Prostate is enlarged, evaluation limited by left hip arthroplasty. Other: No free fluid or  free intraperitoneal gas. No abdominal wall hernia. Musculoskeletal: Unremarkable left hip hemiarthroplasty. No acute or destructive bony lesions. Reconstructed images demonstrate no additional findings. IMPRESSION: 1. Diffuse gaseous distention of the bowel consistent with ileus. 2. Small bilateral pleural effusions. 3. Cardiomegaly with trace pericardial effusion. 4. Diffuse colonic diverticulosis without diverticulitis. 5.  Aortic Atherosclerosis (ICD10-I70.0). Electronically Signed   By: Randa Ngo M.D.   On: 02/02/2021 16:13   DG Chest 1 View  Result Date: 01/29/2021 CLINICAL DATA:  87 year old male status post fall last night. EXAM: CHEST  1 VIEW COMPARISON:  Chest radiographs 04/21/2020 and earlier. FINDINGS: Portable AP supine views at 0903 hours. Chronic cardiomegaly appears stable. Calcified aortic atherosclerosis. Other mediastinal contours are within normal limits. Visualized tracheal air column is within normal limits. Increased pulmonary vascularity, slightly greater on the right. Trace fluid in the right minor fissure now, but no other pleural effusion. No pneumothorax or confluent pulmonary opacity. No acute osseous abnormality identified. IMPRESSION: 1. Chronic cardiomegaly with increased pulmonary vascular congestion. Consider mild or developing interstitial edema. 2. No other acute cardiopulmonary abnormality. Electronically Signed   By: Genevie Ann M.D.   On: 01/29/2021 09:52   DG Chest 2 View  Result Date: 02/04/2021 CLINICAL DATA:  Pneumonia EXAM: CHEST - 2 VIEW COMPARISON:  02/01/2021 FINDINGS: Heart size upper normal. Negative for heart failure. Atherosclerotic calcification aortic arch. Improved aeration in the left lung base. Right lung remains clear. No pleural effusion. Calcified granuloma left upper lobe unchanged. IMPRESSION: Improving left lower lobe atelectasis. Electronically Signed   By: Franchot Gallo M.D.   On: 02/04/2021 14:53   CT HEAD WO CONTRAST (5MM)  Result  Date: 01/29/2021 CLINICAL DATA:  Fall today. EXAM: CT HEAD WITHOUT CONTRAST CT CERVICAL SPINE WITHOUT CONTRAST TECHNIQUE: Multidetector CT imaging of the head and cervical spine was performed following the standard protocol without intravenous contrast. Multiplanar CT image reconstructions of the cervical spine were also generated. COMPARISON:  None. FINDINGS: CT HEAD FINDINGS Brain: Mild diffuse cortical atrophy is noted. No mass effect or midline shift is noted. Ventricular size is within normal limits. There is no evidence of mass lesion, hemorrhage or acute infarction. Vascular: No hyperdense vessel or unexpected calcification. Skull: Normal. Negative for fracture or focal lesion. Sinuses/Orbits: No acute finding. Other: None. CT CERVICAL SPINE FINDINGS Alignment: Mild grade 1 anterolisthesis of C4-5 is noted secondary to posterior facet joint hypertrophy. Skull base and vertebrae: No acute fracture. No primary bone lesion or focal pathologic process. Soft tissues and spinal canal: No prevertebral fluid or swelling. No visible canal hematoma. Disc levels: Moderate degenerative disc disease is noted at C5-6 and C6-7 and C7-T1. Upper chest: Negative. Other: None. IMPRESSION: No acute intracranial abnormality seen. Moderate multilevel degenerative disc disease. No acute abnormality seen in  the cervical spine. Electronically Signed   By: Marijo Conception M.D.   On: 01/29/2021 10:38   CT Cervical Spine Wo Contrast  Result Date: 01/29/2021 CLINICAL DATA:  Fall today. EXAM: CT HEAD WITHOUT CONTRAST CT CERVICAL SPINE WITHOUT CONTRAST TECHNIQUE: Multidetector CT imaging of the head and cervical spine was performed following the standard protocol without intravenous contrast. Multiplanar CT image reconstructions of the cervical spine were also generated. COMPARISON:  None. FINDINGS: CT HEAD FINDINGS Brain: Mild diffuse cortical atrophy is noted. No mass effect or midline shift is noted. Ventricular size is within  normal limits. There is no evidence of mass lesion, hemorrhage or acute infarction. Vascular: No hyperdense vessel or unexpected calcification. Skull: Normal. Negative for fracture or focal lesion. Sinuses/Orbits: No acute finding. Other: None. CT CERVICAL SPINE FINDINGS Alignment: Mild grade 1 anterolisthesis of C4-5 is noted secondary to posterior facet joint hypertrophy. Skull base and vertebrae: No acute fracture. No primary bone lesion or focal pathologic process. Soft tissues and spinal canal: No prevertebral fluid or swelling. No visible canal hematoma. Disc levels: Moderate degenerative disc disease is noted at C5-6 and C6-7 and C7-T1. Upper chest: Negative. Other: None. IMPRESSION: No acute intracranial abnormality seen. Moderate multilevel degenerative disc disease. No acute abnormality seen in the cervical spine. Electronically Signed   By: Marijo Conception M.D.   On: 01/29/2021 10:38   IR Fluoro Guide CV Line Right  Result Date: 02/11/2021 INDICATION: Need for dialysis access MEDICATIONS: Ancef 2 g IV; The antibiotic was administered within an appropriate time interval prior to skin puncture. ANESTHESIA/SEDATION: Moderate (conscious) sedation was employed during this procedure. A total of Versed 0.5 mg and Fentanyl 12.5 mcg was administered intravenously. Moderate Sedation Time: 23 minutes. The patient's level of consciousness and vital signs were monitored continuously by radiology nursing throughout the procedure under my direct supervision. FLUOROSCOPY TIME:  Fluoroscopy Time: 3 minutes 6 seconds with 2 exposures COMPLICATIONS: None immediate. PROCEDURE: Informed written consent was obtained from the patient after a thorough discussion of the procedural risks, benefits and alternatives. All questions were addressed. Maximal Sterile Barrier Technique was utilized including caps, mask, sterile gowns, sterile gloves, sterile drape, hand hygiene and skin antiseptic. A timeout was performed prior to  the initiation of the procedure. The patient was placed supine on the exam table. The right neck and chest was prepped and draped in the standard sterile fashion. Ultrasound was used to evaluate the right internal jugular vein, which found to be patent and suitable for access. An ultrasound image was permanently stored in the electronic medical record. Using ultrasound guidance, the right internal jugular vein was directly punctured using a 21 gauge micropuncture set. An 018 wire was advanced into the SVC, followed by serial tract dilation and placement of an 035 wire which was advanced into the IVC. Attention was then turned to an infraclavicular location, where a small dermatotomy was made after the administration of additional local anesthetic. From this location, a 19 cm tip-to-cuff hemodialysis catheter was tunneled to the venotomy site. A peel-away sheath was then advanced over the access wire. Through this peel-away sheath, the hemodialysis catheter was advanced into the central veins, such that the tip was positioned near the superior cavoatrial junction. The line was found to flush and aspirate appropriately. It was sutured to the skin using 0 silk suture, and the venotomy was closed with Dermabond. A sterile dressing was placed. The patient tolerated the procedure well without immediate complication. IMPRESSION: Successful placement of a  tunneled hemodialysis catheter using the right internal jugular vein. The line is ready for immediate use. Electronically Signed   By: Albin Felling M.D.   On: 02/11/2021 10:51   IR US Guide Vasc Access Right  Result Date: 02/11/2021 INDICATION: Need for dialysis access MEDICATIONS: Ancef 2 g IV; The antibiotic was administered within an appropriate time interval prior to skin puncture. ANESTHESIA/SEDATION: Moderate (conscious) sedation was employed during this procedure. A total of Versed 0.5 mg and Fentanyl 12.5 mcg was administered intravenously. Moderate Sedation  Time: 23 minutes. The patient's level of consciousness and vital signs were monitored continuously by radiology nursing throughout the procedure under my direct supervision. FLUOROSCOPY TIME:  Fluoroscopy Time: 3 minutes 6 seconds with 2 exposures COMPLICATIONS: None immediate. PROCEDURE: Informed written consent was obtained from the patient after a thorough discussion of the procedural risks, benefits and alternatives. All questions were addressed. Maximal Sterile Barrier Technique was utilized including caps, mask, sterile gowns, sterile gloves, sterile drape, hand hygiene and skin antiseptic. A timeout was performed prior to the initiation of the procedure. The patient was placed supine on the exam table. The right neck and chest was prepped and draped in the standard sterile fashion. Ultrasound was used to evaluate the right internal jugular vein, which found to be patent and suitable for access. An ultrasound image was permanently stored in the electronic medical record. Using ultrasound guidance, the right internal jugular vein was directly punctured using a 21 gauge micropuncture set. An 018 wire was advanced into the SVC, followed by serial tract dilation and placement of an 035 wire which was advanced into the IVC. Attention was then turned to an infraclavicular location, where a small dermatotomy was made after the administration of additional local anesthetic. From this location, a 19 cm tip-to-cuff hemodialysis catheter was tunneled to the venotomy site. A peel-away sheath was then advanced over the access wire. Through this peel-away sheath, the hemodialysis catheter was advanced into the central veins, such that the tip was positioned near the superior cavoatrial junction. The line was found to flush and aspirate appropriately. It was sutured to the skin using 0 silk suture, and the venotomy was closed with Dermabond. A sterile dressing was placed. The patient tolerated the procedure well without  immediate complication. IMPRESSION: Successful placement of a tunneled hemodialysis catheter using the right internal jugular vein. The line is ready for immediate use. Electronically Signed   By: Albin Felling M.D.   On: 02/11/2021 10:51   IR US Guide Vasc Access Right  Result Date: 02/09/2021 INDICATION: 87 year old male with reported history of high arterial pressure and clot within the cannula at serial hemodialysis session. EXAM: ULTRASOUND-GUIDED ACCESS X2 RIGHT UPPER EXTREMITY BC FISTULA FISTULAGRAM BALLOON ANGIOPLASTY OF THE PROXIMAL VENOUS OUTFLOW MEDICATIONS: 3000 UNITS HEPARIN 1 MG VERSED, 50 MCG FENTANYL ANESTHESIA/SEDATION: Moderate Sedation Time:  46 minutes The patient was continuously monitored during the procedure by the interventional radiology nurse under my direct supervision. FLUOROSCOPY TIME:  Fluoroscopy Time: 4 minutes 6 seconds (9 mGy). COMPLICATIONS: None PROCEDURE: Informed written consent was obtained from the patient after a thorough discussion of the procedural risks, benefits and alternatives. All questions were addressed. Maximal Sterile Barrier Technique was utilized including caps, mask, sterile gowns, sterile gloves, sterile drape, hand hygiene and skin antiseptic. A timeout was performed prior to the initiation of the procedure. Ultrasound was performed of the right upper extremity. 1% lidocaine was used for local anesthesia. Ultrasound guidance was used for micro puncture access directed centrally.  Angiogram was performed of the venous outflow. Reflux images were then performed. Given the appearance of the proximal venous outflow, we planned angioplasty of the venous outflow. Ultrasound-guided access was then required directed peripherally towards the arteriovenous anastomosis. 1% lidocaine was used for local anesthesia. Micropuncture access was achieved and a microwire was placed. A Bentson wire was passed into the venous access. A 7 French sheath was then placed over the  Bentson wire. [place] the catheter was navigated with the use of a Glidewire into the arterial side of the arteriovenous anastomosis for repeat imaging. Balloon angioplasty was then performed of the arteriovenous anastomosis 6 mm, and then of the proximal venous outflow up the 8 mm. Repeat fistulagram was performed demonstrating resolution of the stenosis in the proximal venous outflow with 0% residual. All catheters wires and sheaths were removed with placement of 2 stay sutures. External and thrill was palpated at the arteriovenous anastomosis. Patient tolerated the procedure well and remained hemodynamically stable throughout. No complications were encountered and no significant blood loss. IMPRESSION: Status post ultrasound-guided access of right upper extremity radiocephalic fistula at 2 sites with balloon angioplasty of the arteriovenous anastomosis to 6 mm, and the proximal venous outflow to a 8 mm. Signed, Dulcy Fanny. Dellia Nims, RPVI [facility] Radiology ACCESS: If there is further problems with the fistula, recommend duplex, as the given history of high arterial pressures does not correlate with the findings of the fistulagram, as there was no downstream venous outflow stenosis perceived, only a venous outflow stenosis just beyond the AV anastomosis. Electronically Signed   By: Corrie Mckusick D.O.   On: 02/09/2021 17:44   DG CHEST PORT 1 VIEW  Result Date: 02/01/2021 CLINICAL DATA:  Follow-up exam. Patient has a history of heart murmur and hypertension. EXAM: PORTABLE CHEST 1 VIEW COMPARISON:  January 29, 2021 FINDINGS: The heart size and mediastinal contours are stable. The heart size enlarged. Central pulmonary vascular congestion is identified slightly improved compared prior exam. The visualized skeletal structures are stable. IMPRESSION: Congestive heart failure slightly improved compared prior exam. Electronically Signed   By: Abelardo Diesel M.D.   On:  02/01/2021 09:25   DG Abd Portable 1V  Result Date: 02/03/2021 CLINICAL DATA:  Ileus EXAM: PORTABLE ABDOMEN - 1 VIEW COMPARISON:  CT 02/02/2021 FINDINGS: Diffuse gaseous distension of the stomach, small bowel, and colon. No identifiable transition. No supine evidence of free air. No acute osseous abnormality. Left hip arthroplasty. IMPRESSION: Persistent diffuse gaseous distension of bowel, consistent with adynamic ileus. Electronically Signed   By: Maurine Simmering M.D.   On: 02/03/2021 14:23   DG Abd Portable 1V  Result Date: 01/31/2021 CLINICAL DATA:  Abdominal distension EXAM: PORTABLE ABDOMEN - 1 VIEW COMPARISON:  Portable exam 1230 hours compared to CT abdomen and pelvis 04/20/2020 FINDINGS: Air-filled loops of large and small bowel throughout abdomen, with small-bowel loops upper normal caliber. No definite evidence of obstruction or wall thickening. Bones demineralized with degenerative changes of thoracolumbar spine and note of a LEFT hip prosthesis. IMPRESSION: Nonspecific bowel gas pattern with air-filled nondistended loops of bowel throughout abdomen. Electronically Signed   By: Lavonia Dana M.D.   On: 01/31/2021 14:22   IR AV DIALY SHUNT INTRO NEEDLE/INTRACATH INITIAL W/PTA/IMG RIGHT  Result Date: 02/09/2021 INDICATION: 87 year old male with reported history of high arterial pressure and clot within the cannula at serial hemodialysis session. EXAM: ULTRASOUND-GUIDED ACCESS X2 RIGHT UPPER EXTREMITY BC FISTULA FISTULAGRAM BALLOON ANGIOPLASTY OF THE PROXIMAL VENOUS OUTFLOW  MEDICATIONS: 3000 UNITS HEPARIN 1 MG VERSED, 50 MCG FENTANYL ANESTHESIA/SEDATION: Moderate Sedation Time:  46 minutes The patient was continuously monitored during the procedure by the interventional radiology nurse under my direct supervision. FLUOROSCOPY TIME:  Fluoroscopy Time: 4 minutes 6 seconds (9 mGy). COMPLICATIONS: None PROCEDURE: Informed written consent was obtained from the patient after a thorough discussion of the  procedural risks, benefits and alternatives. All questions were addressed. Maximal Sterile Barrier Technique was utilized including caps, mask, sterile gowns, sterile gloves, sterile drape, hand hygiene and skin antiseptic. A timeout was performed prior to the initiation of the procedure. Ultrasound was performed of the right upper extremity. 1% lidocaine was used for local anesthesia. Ultrasound guidance was used for micro puncture access directed centrally. Angiogram was performed of the venous outflow. Reflux images were then performed. Given the appearance of the proximal venous outflow, we planned angioplasty of the venous outflow. Ultrasound-guided access was then required directed peripherally towards the arteriovenous anastomosis. 1% lidocaine was used for local anesthesia. Micropuncture access was achieved and a microwire was placed. A Bentson wire was passed into the venous access. A 7 French sheath was then placed over the Bentson wire. [place] the catheter was navigated with the use of a Glidewire into the arterial side of the arteriovenous anastomosis for repeat imaging. Balloon angioplasty was then performed of the arteriovenous anastomosis 6 mm, and then of the proximal venous outflow up the 8 mm. Repeat fistulagram was performed demonstrating resolution of the stenosis in the proximal venous outflow with 0% residual. All catheters wires and sheaths were removed with placement of 2 stay sutures. External and thrill was palpated at the arteriovenous anastomosis. Patient tolerated the procedure well and remained hemodynamically stable throughout. No complications were encountered and no significant blood loss. IMPRESSION: Status post ultrasound-guided access of right upper extremity radiocephalic fistula at 2 sites with balloon angioplasty of the arteriovenous anastomosis to 6 mm, and the proximal venous outflow to a 8 mm. Signed, Dulcy Fanny. Dellia Nims, RPVI [facility]  [facility] ACCESS: If there is further problems with the fistula, recommend duplex, as the given history of high arterial pressures does not correlate with the findings of the fistulagram, as there was no downstream venous outflow stenosis perceived, only a venous outflow stenosis just beyond the AV anastomosis. Electronically Signed   By: Corrie Mckusick D.O.   On: 02/09/2021 17:44   ECHOCARDIOGRAM COMPLETE  Result Date: 02/01/2021    ECHOCARDIOGRAM REPORT   Patient Name:   QUANTA ROHER Date of Exam: 02/01/2021 Medical Rec #:  [id number]      Height:       67.0 in Accession #:    [id number]     Weight:       110.2 lb Date of Birth:  01/15/1933      BSA:          1.570 m Patient Age:    43 years       BP:           122/55 mmHg Patient Gender: M              HR:           85 bpm. Exam Location:  Inpatient Procedure: 2D Echo, Cardiac Doppler, Color Doppler and 3D Echo Indications:    Atrial Fibrillation I48.91  History:        Patient has prior history of Echocardiogram examinations, most  recent 04/21/2020. Aortic Valve Disease; Arrythmias:LBBB. End                 stage renal disease. Acute on chronic diastolic CHF. Multifocal                 atrial tachycardia. Anemia of chronic kidney failure. Closed                 fracture of neck of left femur.  Sonographer:    Darlina Sicilian RDCS Referring Phys: [id number] [place]  1. Left ventricular ejection fraction, by estimation, is 35 to 40%. Left ventricular ejection fraction by 3D volume is 40 %. The left ventricle has moderately decreased function. The left ventricle demonstrates global hypokinesis. Left ventricular diastolic parameters are consistent with Grade I diastolic dysfunction (impaired relaxation). Elevated left atrial pressure.  2. Right ventricular systolic function is normal. The right ventricular size is normal. Tricuspid regurgitation signal is inadequate for assessing PA pressure.  3. Left atrial size was  moderately dilated.  4. The mitral valve is normal in structure. Trivial mitral valve regurgitation.  5. The aortic valve is tricuspid. There is mild calcification of the aortic valve. There is moderate thickening of the aortic valve. Aortic valve regurgitation is moderate. Mild to moderate aortic valve sclerosis/calcification is present, without any evidence of aortic stenosis.  6. The inferior vena cava is normal in size with greater than 50% respiratory variability, suggesting right atrial pressure of 3 mmHg. Comparison(s): Prior images reviewed side by side. The left ventricular function is worsened. FINDINGS  Left Ventricle: Left ventricular ejection fraction, by estimation, is 35 to 40%. Left ventricular ejection fraction by 3D volume is 40 %. The left ventricle has moderately decreased function. The left ventricle demonstrates global hypokinesis. The left ventricular internal cavity size was normal in size. There is no left ventricular hypertrophy. Abnormal (paradoxical) septal motion, consistent with left bundle branch block. Left ventricular diastolic parameters are consistent with Grade I diastolic dysfunction (impaired relaxation). Elevated left atrial pressure. Right Ventricle: The right ventricular size is normal. No increase in right ventricular wall thickness. Right ventricular systolic function is normal. Tricuspid regurgitation signal is inadequate for assessing PA pressure. Left Atrium: Left atrial size was moderately dilated. Right Atrium: Right atrial size was normal in size. Pericardium: There is no evidence of pericardial effusion. Mitral Valve: The mitral valve is normal in structure. Mild mitral annular calcification. Trivial mitral valve regurgitation. Tricuspid Valve: The tricuspid valve is normal in structure. Tricuspid valve regurgitation is not demonstrated. Aortic Valve: The aortic valve is tricuspid. There is mild calcification of the aortic valve. There is moderate thickening of the  aortic valve. Aortic valve regurgitation is moderate. Mild to moderate aortic valve sclerosis/calcification is present, without any evidence of aortic stenosis. Pulmonic Valve: The pulmonic valve was not well visualized. Pulmonic valve regurgitation is mild to moderate. Aorta: The aortic root and ascending aorta are structurally normal, with no evidence of dilitation. Venous: The inferior vena cava is normal in size with greater than 50% respiratory variability, suggesting right atrial pressure of 3 mmHg. IAS/Shunts: No atrial level shunt detected by color flow Doppler.  LEFT VENTRICLE PLAX 2D LVIDd:         5.50 cm         Diastology LVIDs:         4.40 cm         LV e' medial:    3.30 cm/s LV PW:  0.90 cm         LV E/e' medial:  16.2 LV IVS:        1.10 cm         LV e' lateral:   2.93 cm/s LVOT diam:     2.00 cm         LV E/e' lateral: 18.3 LV SV:         60 LV SV Index:   38 LVOT Area:     3.14 cm        3D Volume EF                                LV 3D EF:    Left                                             ventricul LV Volumes (MOD)                            ar LV vol s, MOD    48.5 ml                    ejection A2C:                                        fraction                                             by 3D                                             volume is                                             40 %.                                 3D Volume EF:                                3D EF:        40 %                                LV EDV:       189 ml                                LV ESV:       113 ml  LV SV:        76 ml RIGHT VENTRICLE RV S prime:     10.30 cm/s TAPSE (M-mode): 1.4 cm LEFT ATRIUM           Index        RIGHT ATRIUM           Index LA diam:      4.80 cm 3.06 cm/m   RA Area:     10.60 cm LA Vol (A4C): 36.4 ml 23.15 ml/m  RA Volume:   17.70 ml  11.27 ml/m  AORTIC VALVE LVOT Vmax:   105.00 cm/s LVOT Vmean:  63.500 cm/s LVOT VTI:    0.192 m   AORTA Ao Root diam: 3.60 cm Ao Asc diam:  3.60 cm MITRAL VALVE MV Area (PHT): 2.56 cm    SHUNTS MV Decel Time: 296 msec    Systemic VTI:  0.19 m MV E velocity: 53.50 cm/s  Systemic Diam: 2.00 cm MV A velocity: 96.40 cm/s MV E/A ratio:  0.55 Mihai Croitoru MD Electronically signed by Sanda Klein MD Signature Date/Time: 02/01/2021/2:21:26 PM    Final    DG HIP UNILAT WITH PELVIS 1V LEFT  Result Date: 01/30/2021 CLINICAL DATA:  Left hip hemiarthroplasty EXAM: DG HIP (WITH OR WITHOUT PELVIS) 1V*L* COMPARISON:  01/29/2021 FINDINGS: Single frontal view of the left hip demonstrates interval placement of a left hip hemiarthroplasty in the expected position without signs of acute complication. Postsurgical changes in the soft tissues. IMPRESSION: 1. Unremarkable left hip hemiarthroplasty. Electronically Signed   By: Randa Ngo M.D.   On: 01/30/2021 19:07   DG Hip Unilat W or Wo Pelvis 2-3 Views Left  Result Date: 01/29/2021 CLINICAL DATA:  Fall. EXAM: DG HIP (WITH OR WITHOUT PELVIS) 2-3V LEFT COMPARISON:  None. FINDINGS: Acute, impacted fracture deformity involves the femoral neck. There is medial angulation of the distal fracture fragments. No dislocation. Right hip is intact. IMPRESSION: Acute, impacted fracture of the left femoral neck. Electronically Signed   By: Kerby Moors M.D.   On: 01/29/2021 09:54    Microbiology: Recent Results (from the past 240 hour(s))  Resp Panel by RT-PCR (Flu A&B, Covid) Nasopharyngeal Swab     Status: None   Collection Time: 02/10/21 12:54 PM   Specimen: Nasopharyngeal Swab; Nasopharyngeal(NP) swabs in vial transport medium  Result Value Ref Range Status   SARS Coronavirus 2 by RT PCR NEGATIVE NEGATIVE Final    Comment: (NOTE) SARS-CoV-2 target nucleic acids are NOT DETECTED.  The SARS-CoV-2 RNA is generally detectable in upper respiratory specimens during the acute phase of infection. The lowest concentration of SARS-CoV-2 viral copies this assay can detect  is 138 copies/mL. A negative result does not preclude SARS-Cov-2 infection and should not be used as the sole basis for treatment or other patient management decisions. A negative result may occur with  improper specimen collection/handling, submission of specimen other than nasopharyngeal swab, presence of viral mutation(s) within the areas targeted by this assay, and inadequate number of viral copies(<138 copies/mL). A negative result must be combined with clinical observations, patient history, and epidemiological information. The expected result is Negative.  Fact Sheet for Patients:  EntrepreneurPulse.com.au  Fact Sheet for Healthcare Providers:  IncredibleEmployment.be  This test is no t yet approved or cleared by the [place] FDA and  has been authorized for detection and/or diagnosis of SARS-CoV-2 by FDA under an Emergency Use Authorization (EUA). This EUA will remain  in effect (meaning this test can be used)  for the duration of the COVID-19 declaration under Section 564(b)(1) of the Act, 21 U.S.C.section 360bbb-3(b)(1), unless the authorization is terminated  or revoked sooner.       Influenza A by PCR NEGATIVE NEGATIVE Final   Influenza B by PCR NEGATIVE NEGATIVE Final    Comment: (NOTE) The Xpert Xpress SARS-CoV-2/FLU/RSV plus assay is intended as an aid in the diagnosis of influenza from Nasopharyngeal swab specimens and should not be used as a sole basis for treatment. Nasal washings and aspirates are unacceptable for Xpert Xpress SARS-CoV-2/FLU/RSV testing.  Fact Sheet for Patients: EntrepreneurPulse.com.au  Fact Sheet for Healthcare Providers: IncredibleEmployment.be  This test is not yet approved or cleared by the [place] FDA and has been authorized for detection and/or diagnosis of SARS-CoV-2 by FDA under an Emergency Use Authorization (EUA). This EUA will remain in effect  (meaning this test can be used) for the duration of the COVID-19 declaration under Section 564(b)(1) of the Act, 21 U.S.C. section 360bbb-3(b)(1), unless the authorization is terminated or revoked.  Performed at [facility], [place] [address] [place], [place] [address]      Labs: Basic Metabolic Panel: Recent Labs  Lab 02/06/21 0248 02/07/21 0230 02/08/21 0217 02/09/21 0332 02/10/21 0304 02/10/21 0822 02/11/21 0307  NA 137   < > 136 132* 131* 134* 129*  K 3.5   < > 3.7 4.1 4.3 4.4 4.4  CL 99   < > 97* 92* 94* 94* 92*  CO2 27   < > 28 27 26 26  21*  GLUCOSE 126*   < > 110* 106* 115* 101* 87  BUN 26*   < > 29* 46* 59* 60* 60*  CREATININE 3.74*   < > 3.62* 5.05* 6.15* 6.50* 6.49*  CALCIUM 9.0   < > 9.1 9.4 9.4 9.4 9.0  MG 2.1  --   --   --   --   --   --   PHOS 4.1   < > 4.0 4.9* 5.5* 5.4* 5.5*   < > = values in this interval not displayed.   Liver Function Tests: Recent Labs  Lab 02/08/21 0217 02/09/21 0332 02/10/21 0304 02/10/21 0822 02/11/21 0307  ALBUMIN 2.5* 2.6* 2.6* 2.6* 2.6*   No results for input(s): LIPASE, AMYLASE in the last 168 hours. No results for input(s): AMMONIA in the last 168 hours. CBC: Recent Labs  Lab 02/05/21 0323 02/07/21 0631 02/09/21 0332 02/10/21 0822  WBC 14.7* 14.5* 13.0* 20.5*  NEUTROABS 11.4*  --  8.4*  --   HGB 10.2* 10.0* 9.7* 9.3*  HCT 30.8* 30.8* 30.4* 28.5*  MCV 97.8 98.7 99.3 98.6  PLT 272 344 347 409*   Cardiac Enzymes: No results for input(s): CKTOTAL, CKMB, CKMBINDEX, TROPONINI in the last 168 hours. BNP: BNP (last 3 results) Recent Labs    04/20/20 1819  BNP 2,566.7*    ProBNP (last 3 results) No results for input(s): PROBNP in the last 8760 hours.  CBG: Recent Labs  Lab 02/06/21 1551  GLUCAP 132*       Signed:  Kayleen Memos MD   [facility] 02/11/2021, 4:54 PM

## 2021-02-11 NOTE — TOC Transition Note (Signed)
Transition of Care [facility]) - CM/SW Discharge Note   Patient Details  Name: [name] MRN: [id number] Date of Birth: 04/06/1933  Transition of [facility]) CM/SW Contact:  Milas Gain, [place] Phone Number: 02/11/2021, 1:45 PM   Clinical Narrative:     Patient will DC to: [place]  Anticipated DC date: 02/11/2021  Family notified: Venetia Night  Transport by: Corey Harold  ?  Per MD patient ready for DC to [facility]. RN, patient, patient's family, Olivia Mackie renal navigator, and facility notified of DC. Discharge Summary sent to facility. RN given number for report tele# [phone] RM# [phone]. DC packet on chart. Ambulance transport requested for patient.  CSW signing off.   Final next level of care: Skilled Nursing Facility Barriers to Discharge: No Barriers Identified   Patient Goals and CMS Choice Patient states their goals for this hospitalization and ongoing recovery are:: SNF CMS Medicare.gov Compare Post Acute Care list provided to:: Patient Represenative (must comment) (Patients spouse [name]) Choice offered to / list presented to : Spouse (Patient spouse Venetia Night)  Discharge Placement              Patient chooses bed at: [facility] Patient to be transferred to facility by: [place] Name of family member notified: [name] Patient and family notified of of transfer: 02/11/21  Discharge Plan and Services   Discharge Planning Services: CM Consult Post Acute Care Choice: Durable Medical Equipment, Home Health          DME Arranged: 3-N-1         HH Arranged: PT, OT [facility] Agency: [place] Date [place]: 02/04/21 Time [place]: [place] Representative spoke with at [place]: [place] ([place]) Interventions     Readmission Risk Interventions No flowsheet data found.

## 2021-02-11 NOTE — Procedures (Signed)
Interventional Radiology Procedure Note  Date of Procedure: 02/11/2021  Procedure: Tunneled HD line placement   Findings:  1. Placement of right IJ tunneled HD line    Complications: No immediate complications noted.   Estimated Blood Loss: minimal  Follow-up and Recommendations: 1. Ready for use    Albin Felling, MD  Vascular & Interventional Radiology  02/11/2021 10:47 AM

## 2021-02-11 NOTE — Care Management Important Message (Signed)
Important Message  Patient Details  Name: Jeffrey Payne MRN: [id number] Date of Birth: 11/07/1932   Medicare Important Message Given:  Yes     [name] [address] 02/11/2021, 3:54 PM

## 2021-02-11 NOTE — Progress Notes (Signed)
[facility] Progress Note   Subjective:   New TDC placed today. Patient is sleepy after the procedure. Denies SOB and CP.   Objective Vitals:   02/11/21 1005 02/11/21 1010 02/11/21 1015 02/11/21 1030  BP: (!) 102/41 (!) 106/46 (!) 121/46 (!) 120/41  Pulse: 61 60 64 (!) 59  Resp: 14 18 14 14   Temp:      TempSrc:      SpO2: 100% 100% 100% 100%  Weight:      Height:       Physical Exam General: Frail, elderly male in NAD Heart: RRR, no murmur Lungs: CTA bilaterally Abdomen: Soft, non-tender, non-distended, +BS Extremities: No edema b/l lower extremities Dialysis Access: RUE AVF + bruit, new TDC with clean dressing  Additional Objective Labs: Basic Metabolic Panel: Recent Labs  Lab 02/10/21 0304 02/10/21 0822 02/11/21 0307  NA 131* 134* 129*  K 4.3 4.4 4.4  CL 94* 94* 92*  CO2 26 26 21*  GLUCOSE 115* 101* 87  BUN 59* 60* 60*  CREATININE 6.15* 6.50* 6.49*  CALCIUM 9.4 9.4 9.0  PHOS 5.5* 5.4* 5.5*   Liver Function Tests: Recent Labs  Lab 02/10/21 0304 02/10/21 0822 02/11/21 0307  ALBUMIN 2.6* 2.6* 2.6*   No results for input(s): LIPASE, AMYLASE in the last 168 hours. CBC: Recent Labs  Lab 02/05/21 0323 02/07/21 0631 02/09/21 0332 02/10/21 0822  WBC 14.7* 14.5* 13.0* 20.5*  NEUTROABS 11.4*  --  8.4*  --   HGB 10.2* 10.0* 9.7* 9.3*  HCT 30.8* 30.8* 30.4* 28.5*  MCV 97.8 98.7 99.3 98.6  PLT 272 344 347 409*   Blood Culture    Component Value Date/Time   SDES  04/21/2020 0328    BLOOD RIGHT HAND Performed at [facility], [place] [address] [place], [place] [address]    SPECREQUEST  04/21/2020 0328    BOTTLES DRAWN AEROBIC ONLY Blood Culture adequate volume Performed at [place] [address] [place], [place] [address]    CULT  04/21/2020 0328    NO GROWTH 5 DAYS Performed at [facility], [place] [address] [place], [place] [address]    REPTSTATUS 04/26/2020 FINAL 04/21/2020 0328     Cardiac Enzymes: No results for input(s): CKTOTAL, CKMB, CKMBINDEX, TROPONINI in the last 168 hours. CBG: Recent Labs  Lab 02/06/21 1551  GLUCAP 132*   Iron Studies: No results for input(s): IRON, TIBC, TRANSFERRIN, FERRITIN in the last 72 hours. @lablastinr3 @ Studies/Results: IR Fluoro Guide CV Line Right  Result Date: 02/11/2021 INDICATION: Need for dialysis access MEDICATIONS: Ancef 2 g IV; The antibiotic was administered within an appropriate time interval prior to skin puncture. ANESTHESIA/SEDATION: Moderate (conscious) sedation was employed during this procedure. A total of Versed 0.5 mg and Fentanyl 12.5 mcg was administered intravenously. Moderate Sedation Time: 23 minutes. The patient's level of consciousness and vital signs were monitored continuously by radiology nursing throughout the procedure under my direct supervision. FLUOROSCOPY TIME:  Fluoroscopy Time: 3 minutes 6 seconds with 2 exposures COMPLICATIONS: None immediate. PROCEDURE: Informed written consent was obtained from the patient after a thorough discussion of the procedural risks, benefits and alternatives. All questions were addressed. Maximal Sterile Barrier Technique was utilized including caps, mask, sterile gowns, sterile gloves, sterile drape, hand hygiene and skin antiseptic. A timeout was performed prior to the initiation of the procedure. The patient was placed supine on the exam table. The right neck and chest was prepped and draped in the standard sterile  fashion. Ultrasound was used to evaluate the right internal jugular vein, which found to be patent and suitable for access. An ultrasound image was permanently stored in the electronic medical record. Using ultrasound guidance, the right internal jugular vein was directly punctured using a 21 gauge micropuncture set. An 018 wire was advanced into the SVC, followed by serial tract dilation and placement of an 035 wire which was advanced into the IVC. Attention  was then turned to an infraclavicular location, where a small dermatotomy was made after the administration of additional local anesthetic. From this location, a 19 cm tip-to-cuff hemodialysis catheter was tunneled to the venotomy site. A peel-away sheath was then advanced over the access wire. Through this peel-away sheath, the hemodialysis catheter was advanced into the central veins, such that the tip was positioned near the superior cavoatrial junction. The line was found to flush and aspirate appropriately. It was sutured to the skin using 0 silk suture, and the venotomy was closed with Dermabond. A sterile dressing was placed. The patient tolerated the procedure well without immediate complication. IMPRESSION: Successful placement of a tunneled hemodialysis catheter using the right internal jugular vein. The line is ready for immediate use. Electronically Signed   By: Albin Felling M.D.   On: 02/11/2021 10:51   IR US Guide Vasc Access Right  Result Date: 02/11/2021 INDICATION: Need for dialysis access MEDICATIONS: Ancef 2 g IV; The antibiotic was administered within an appropriate time interval prior to skin puncture. ANESTHESIA/SEDATION: Moderate (conscious) sedation was employed during this procedure. A total of Versed 0.5 mg and Fentanyl 12.5 mcg was administered intravenously. Moderate Sedation Time: 23 minutes. The patient's level of consciousness and vital signs were monitored continuously by radiology nursing throughout the procedure under my direct supervision. FLUOROSCOPY TIME:  Fluoroscopy Time: 3 minutes 6 seconds with 2 exposures COMPLICATIONS: None immediate. PROCEDURE: Informed written consent was obtained from the patient after a thorough discussion of the procedural risks, benefits and alternatives. All questions were addressed. Maximal Sterile Barrier Technique was utilized including caps, mask, sterile gowns, sterile gloves, sterile drape, hand hygiene and skin antiseptic. A timeout was  performed prior to the initiation of the procedure. The patient was placed supine on the exam table. The right neck and chest was prepped and draped in the standard sterile fashion. Ultrasound was used to evaluate the right internal jugular vein, which found to be patent and suitable for access. An ultrasound image was permanently stored in the electronic medical record. Using ultrasound guidance, the right internal jugular vein was directly punctured using a 21 gauge micropuncture set. An 018 wire was advanced into the SVC, followed by serial tract dilation and placement of an 035 wire which was advanced into the IVC. Attention was then turned to an infraclavicular location, where a small dermatotomy was made after the administration of additional local anesthetic. From this location, a 19 cm tip-to-cuff hemodialysis catheter was tunneled to the venotomy site. A peel-away sheath was then advanced over the access wire. Through this peel-away sheath, the hemodialysis catheter was advanced into the central veins, such that the tip was positioned near the superior cavoatrial junction. The line was found to flush and aspirate appropriately. It was sutured to the skin using 0 silk suture, and the venotomy was closed with Dermabond. A sterile dressing was placed. The patient tolerated the procedure well without immediate complication. IMPRESSION: Successful placement of a tunneled hemodialysis catheter using the right internal jugular vein. The line is ready for immediate use. Electronically  Signed   By: Albin Felling M.D.   On: 02/11/2021 10:51   IR US Guide Vasc Access Right  Result Date: 02/09/2021 INDICATION: 87 year old male with reported history of high arterial pressure and clot within the cannula at serial hemodialysis session. EXAM: ULTRASOUND-GUIDED ACCESS X2 RIGHT UPPER EXTREMITY BC FISTULA FISTULAGRAM BALLOON ANGIOPLASTY OF THE PROXIMAL VENOUS OUTFLOW MEDICATIONS: 3000 UNITS HEPARIN 1 MG VERSED, 50 MCG  FENTANYL ANESTHESIA/SEDATION: Moderate Sedation Time:  46 minutes The patient was continuously monitored during the procedure by the interventional radiology nurse under my direct supervision. FLUOROSCOPY TIME:  Fluoroscopy Time: 4 minutes 6 seconds (9 mGy). COMPLICATIONS: None PROCEDURE: Informed written consent was obtained from the patient after a thorough discussion of the procedural risks, benefits and alternatives. All questions were addressed. Maximal Sterile Barrier Technique was utilized including caps, mask, sterile gowns, sterile gloves, sterile drape, hand hygiene and skin antiseptic. A timeout was performed prior to the initiation of the procedure. Ultrasound was performed of the right upper extremity. 1% lidocaine was used for local anesthesia. Ultrasound guidance was used for micro puncture access directed centrally. Angiogram was performed of the venous outflow. Reflux images were then performed. Given the appearance of the proximal venous outflow, we planned angioplasty of the venous outflow. Ultrasound-guided access was then required directed peripherally towards the arteriovenous anastomosis. 1% lidocaine was used for local anesthesia. Micropuncture access was achieved and a microwire was placed. A Bentson wire was passed into the venous access. A 7 French sheath was then placed over the Bentson wire. Kumpe the catheter was navigated with the use of a Glidewire into the arterial side of the arteriovenous anastomosis for repeat imaging. Balloon angioplasty was then performed of the arteriovenous anastomosis 6 mm, and then of the proximal venous outflow up the 8 mm. Repeat fistulagram was performed demonstrating resolution of the stenosis in the proximal venous outflow with 0% residual. All catheters wires and sheaths were removed with placement of 2 stay sutures. External and thrill was palpated at the arteriovenous anastomosis. Patient tolerated the procedure well and remained hemodynamically  stable throughout. No complications were encountered and no significant blood loss. IMPRESSION: Status post ultrasound-guided access of right upper extremity radiocephalic fistula at 2 sites with balloon angioplasty of the arteriovenous anastomosis to 6 mm, and the proximal venous outflow to a 8 mm. Signed, Dulcy Fanny. Dellia Nims, RPVI [facility] Radiology ACCESS: If there is further problems with the fistula, recommend duplex, as the given history of high arterial pressures does not correlate with the findings of the fistulagram, as there was no downstream venous outflow stenosis perceived, only a venous outflow stenosis just beyond the AV anastomosis. Electronically Signed   By: Corrie Mckusick D.O.   On: 02/09/2021 17:44   IR AV DIALY SHUNT INTRO NEEDLE/INTRACATH INITIAL W/PTA/IMG RIGHT  Result Date: 02/09/2021 INDICATION: 87 year old male with reported history of high arterial pressure and clot within the cannula at serial hemodialysis session. EXAM: ULTRASOUND-GUIDED ACCESS X2 RIGHT UPPER EXTREMITY BC FISTULA FISTULAGRAM BALLOON ANGIOPLASTY OF THE PROXIMAL VENOUS OUTFLOW MEDICATIONS: 3000 UNITS HEPARIN 1 MG VERSED, 50 MCG FENTANYL ANESTHESIA/SEDATION: Moderate Sedation Time:  46 minutes The patient was continuously monitored during the procedure by the interventional radiology nurse under my direct supervision. FLUOROSCOPY TIME:  Fluoroscopy Time: 4 minutes 6 seconds (9 mGy). COMPLICATIONS: None PROCEDURE: Informed written consent was obtained from the patient after a thorough discussion of the procedural risks, benefits and alternatives. All questions were addressed. Maximal Sterile Barrier Technique was  utilized including caps, mask, sterile gowns, sterile gloves, sterile drape, hand hygiene and skin antiseptic. A timeout was performed prior to the initiation of the procedure. Ultrasound was performed of the right upper extremity. 1% lidocaine was used for  local anesthesia. Ultrasound guidance was used for micro puncture access directed centrally. Angiogram was performed of the venous outflow. Reflux images were then performed. Given the appearance of the proximal venous outflow, we planned angioplasty of the venous outflow. Ultrasound-guided access was then required directed peripherally towards the arteriovenous anastomosis. 1% lidocaine was used for local anesthesia. Micropuncture access was achieved and a microwire was placed. A Bentson wire was passed into the venous access. A 7 French sheath was then placed over the Bentson wire. Kumpe the catheter was navigated with the use of a Glidewire into the arterial side of the arteriovenous anastomosis for repeat imaging. Balloon angioplasty was then performed of the arteriovenous anastomosis 6 mm, and then of the proximal venous outflow up the 8 mm. Repeat fistulagram was performed demonstrating resolution of the stenosis in the proximal venous outflow with 0% residual. All catheters wires and sheaths were removed with placement of 2 stay sutures. External and thrill was palpated at the arteriovenous anastomosis. Patient tolerated the procedure well and remained hemodynamically stable throughout. No complications were encountered and no significant blood loss. IMPRESSION: Status post ultrasound-guided access of right upper extremity radiocephalic fistula at 2 sites with balloon angioplasty of the arteriovenous anastomosis to 6 mm, and the proximal venous outflow to a 8 mm. Signed, Dulcy Fanny. Dellia Nims, RPVI [facility] Radiology ACCESS: If there is further problems with the fistula, recommend duplex, as the given history of high arterial pressures does not correlate with the findings of the fistulagram, as there was no downstream venous outflow stenosis perceived, only a venous outflow stenosis just beyond the AV anastomosis. Electronically Signed   By: Corrie Mckusick D.O.    On: 02/09/2021 17:44   Medications:  ceFAZolin 2 g (02/11/21 0950)    aspirin EC  81 mg Oral Daily   Chlorhexidine Gluconate Cloth  6 each Topical Q0600   Chlorhexidine Gluconate Cloth  6 each Topical Q0600   [START ON 02/12/2021] darbepoetin (ARANESP) injection - DIALYSIS  25 mcg Intravenous Q Thu-HD   feeding supplement (NEPRO CARB STEADY)  237 mL Oral TID BM   [START ON 02/12/2021] heparin  5,000 Units Subcutaneous Q8H   lidocaine       metoprolol tartrate  12.5 mg Oral TID   midodrine  5 mg Oral TID WC   multivitamin  1 tablet Oral QHS   mupirocin ointment   Topical BID   polyethylene glycol  17 g Oral Daily   sevelamer carbonate  800 mg Oral TID WC   tamsulosin  0.4 mg Oral BID    Dialysis Orders: TTS at [facility] 4hr, 400/700, EDW 54.5kg (needs new EDW), 2K/2Ca, R AVF,  - heparin 4000 units IV TIW - No ESA, VDRA (fairly recent start)  Assessment/Plan: 1. L hip fracture s/p fall: s/pL hemiarthroplasty 01/30/2021 by ortho. 2. ESRD: Continue HD on TTS schedule; having some issues with hypotension during HD but improving. He is well below his EDW and volume status is stable, minimal UF with HD.  3. Vascular access issues: S/p fistulogram on 02/09/21. Unable to cannulate on 10/18, new TDC placed. Suspect we will be able to use AVF again soon after it has some time ot heal.  4. HTN/volume: Mild hypoxia/pulm edem on  admit-improved. Appears euvolemic today.  5. Anemia: Hgb 11.1 on admit, trended down to 9.7. Given Aranesp 25 mcg IV 01/31/21, will give another dose with HD today.  6. Secondary hyperparathyroidism: CorrCa high, Not binders or VDRA. Use 2.0 Ca bath. phos restricted diet. Started renvela , phos is controlled.  7. Nutrition: Alb low,push protein supplements 8. Hx urinary retention: has foley catheter. Per primary 9. Dispo: D/c to SNF after dialysis today  Anice Paganini, PA-C 02/11/2021, 11:47 AM  [facility] Pager: [phone]

## 2021-02-12 DIAGNOSIS — I959 Hypotension, unspecified: Secondary | ICD-10-CM | POA: Diagnosis not present

## 2021-02-12 DIAGNOSIS — R0902 Hypoxemia: Secondary | ICD-10-CM | POA: Diagnosis not present

## 2021-02-12 DIAGNOSIS — R29898 Other symptoms and signs involving the musculoskeletal system: Secondary | ICD-10-CM | POA: Diagnosis not present

## 2021-02-12 DIAGNOSIS — S72002A Fracture of unspecified part of neck of left femur, initial encounter for closed fracture: Secondary | ICD-10-CM | POA: Diagnosis not present

## 2021-02-12 DIAGNOSIS — K567 Ileus, unspecified: Secondary | ICD-10-CM | POA: Diagnosis not present

## 2021-02-12 DIAGNOSIS — Z9889 Other specified postprocedural states: Secondary | ICD-10-CM | POA: Diagnosis not present

## 2021-02-12 DIAGNOSIS — D689 Coagulation defect, unspecified: Secondary | ICD-10-CM | POA: Diagnosis not present

## 2021-02-12 DIAGNOSIS — D649 Anemia, unspecified: Secondary | ICD-10-CM | POA: Diagnosis not present

## 2021-02-12 DIAGNOSIS — Z8781 Personal history of (healed) traumatic fracture: Secondary | ICD-10-CM | POA: Diagnosis not present

## 2021-02-12 DIAGNOSIS — I5021 Acute systolic (congestive) heart failure: Secondary | ICD-10-CM | POA: Diagnosis not present

## 2021-02-12 DIAGNOSIS — D472 Monoclonal gammopathy: Secondary | ICD-10-CM | POA: Diagnosis not present

## 2021-02-12 DIAGNOSIS — N186 End stage renal disease: Secondary | ICD-10-CM | POA: Diagnosis not present

## 2021-02-12 DIAGNOSIS — E871 Hypo-osmolality and hyponatremia: Secondary | ICD-10-CM | POA: Diagnosis not present

## 2021-02-12 DIAGNOSIS — I480 Paroxysmal atrial fibrillation: Secondary | ICD-10-CM | POA: Diagnosis not present

## 2021-02-12 DIAGNOSIS — R262 Difficulty in walking, not elsewhere classified: Secondary | ICD-10-CM | POA: Diagnosis not present

## 2021-02-12 DIAGNOSIS — Z992 Dependence on renal dialysis: Secondary | ICD-10-CM | POA: Diagnosis not present

## 2021-02-12 DIAGNOSIS — M6281 Muscle weakness (generalized): Secondary | ICD-10-CM | POA: Diagnosis not present

## 2021-02-12 DIAGNOSIS — S72002D Fracture of unspecified part of neck of left femur, subsequent encounter for closed fracture with routine healing: Secondary | ICD-10-CM | POA: Diagnosis not present

## 2021-02-12 DIAGNOSIS — Z23 Encounter for immunization: Secondary | ICD-10-CM | POA: Diagnosis not present

## 2021-02-12 DIAGNOSIS — I5041 Acute combined systolic (congestive) and diastolic (congestive) heart failure: Secondary | ICD-10-CM | POA: Diagnosis not present

## 2021-02-12 DIAGNOSIS — I129 Hypertensive chronic kidney disease with stage 1 through stage 4 chronic kidney disease, or unspecified chronic kidney disease: Secondary | ICD-10-CM | POA: Diagnosis not present

## 2021-02-12 DIAGNOSIS — E876 Hypokalemia: Secondary | ICD-10-CM | POA: Diagnosis not present

## 2021-02-12 DIAGNOSIS — D509 Iron deficiency anemia, unspecified: Secondary | ICD-10-CM | POA: Diagnosis not present

## 2021-02-12 DIAGNOSIS — R2689 Other abnormalities of gait and mobility: Secondary | ICD-10-CM | POA: Diagnosis not present

## 2021-02-12 DIAGNOSIS — Z9181 History of falling: Secondary | ICD-10-CM | POA: Diagnosis not present

## 2021-02-12 DIAGNOSIS — D72829 Elevated white blood cell count, unspecified: Secondary | ICD-10-CM | POA: Diagnosis not present

## 2021-02-12 DIAGNOSIS — I471 Supraventricular tachycardia: Secondary | ICD-10-CM | POA: Diagnosis not present

## 2021-02-12 DIAGNOSIS — N2581 Secondary hyperparathyroidism of renal origin: Secondary | ICD-10-CM | POA: Diagnosis not present

## 2021-02-12 DIAGNOSIS — M25552 Pain in left hip: Secondary | ICD-10-CM | POA: Diagnosis not present

## 2021-02-12 DIAGNOSIS — R1312 Dysphagia, oropharyngeal phase: Secondary | ICD-10-CM | POA: Diagnosis not present

## 2021-02-12 DIAGNOSIS — R498 Other voice and resonance disorders: Secondary | ICD-10-CM | POA: Diagnosis not present

## 2021-02-12 DIAGNOSIS — Z743 Need for continuous supervision: Secondary | ICD-10-CM | POA: Diagnosis not present

## 2021-02-12 DIAGNOSIS — T82590A Other mechanical complication of surgically created arteriovenous fistula, initial encounter: Secondary | ICD-10-CM | POA: Diagnosis not present

## 2021-02-14 DIAGNOSIS — D689 Coagulation defect, unspecified: Secondary | ICD-10-CM | POA: Diagnosis not present

## 2021-02-14 DIAGNOSIS — D472 Monoclonal gammopathy: Secondary | ICD-10-CM | POA: Diagnosis not present

## 2021-02-14 DIAGNOSIS — N186 End stage renal disease: Secondary | ICD-10-CM | POA: Diagnosis not present

## 2021-02-14 DIAGNOSIS — Z992 Dependence on renal dialysis: Secondary | ICD-10-CM | POA: Diagnosis not present

## 2021-02-14 DIAGNOSIS — Z23 Encounter for immunization: Secondary | ICD-10-CM | POA: Diagnosis not present

## 2021-02-14 DIAGNOSIS — D509 Iron deficiency anemia, unspecified: Secondary | ICD-10-CM | POA: Diagnosis not present

## 2021-02-14 DIAGNOSIS — N2581 Secondary hyperparathyroidism of renal origin: Secondary | ICD-10-CM | POA: Diagnosis not present

## 2021-02-17 DIAGNOSIS — D689 Coagulation defect, unspecified: Secondary | ICD-10-CM | POA: Diagnosis not present

## 2021-02-17 DIAGNOSIS — D509 Iron deficiency anemia, unspecified: Secondary | ICD-10-CM | POA: Diagnosis not present

## 2021-02-17 DIAGNOSIS — Z23 Encounter for immunization: Secondary | ICD-10-CM | POA: Diagnosis not present

## 2021-02-17 DIAGNOSIS — D472 Monoclonal gammopathy: Secondary | ICD-10-CM | POA: Diagnosis not present

## 2021-02-17 DIAGNOSIS — N186 End stage renal disease: Secondary | ICD-10-CM | POA: Diagnosis not present

## 2021-02-17 DIAGNOSIS — Z992 Dependence on renal dialysis: Secondary | ICD-10-CM | POA: Diagnosis not present

## 2021-02-17 DIAGNOSIS — N2581 Secondary hyperparathyroidism of renal origin: Secondary | ICD-10-CM | POA: Diagnosis not present

## 2021-02-18 DIAGNOSIS — Z992 Dependence on renal dialysis: Secondary | ICD-10-CM | POA: Diagnosis not present

## 2021-02-19 DIAGNOSIS — D509 Iron deficiency anemia, unspecified: Secondary | ICD-10-CM | POA: Diagnosis not present

## 2021-02-19 DIAGNOSIS — E876 Hypokalemia: Secondary | ICD-10-CM | POA: Diagnosis not present

## 2021-02-19 DIAGNOSIS — S72002D Fracture of unspecified part of neck of left femur, subsequent encounter for closed fracture with routine healing: Secondary | ICD-10-CM | POA: Diagnosis not present

## 2021-02-19 DIAGNOSIS — Z992 Dependence on renal dialysis: Secondary | ICD-10-CM | POA: Diagnosis not present

## 2021-02-19 DIAGNOSIS — D472 Monoclonal gammopathy: Secondary | ICD-10-CM | POA: Diagnosis not present

## 2021-02-19 DIAGNOSIS — I480 Paroxysmal atrial fibrillation: Secondary | ICD-10-CM | POA: Diagnosis not present

## 2021-02-19 DIAGNOSIS — Z23 Encounter for immunization: Secondary | ICD-10-CM | POA: Diagnosis not present

## 2021-02-19 DIAGNOSIS — N2581 Secondary hyperparathyroidism of renal origin: Secondary | ICD-10-CM | POA: Diagnosis not present

## 2021-02-19 DIAGNOSIS — N186 End stage renal disease: Secondary | ICD-10-CM | POA: Diagnosis not present

## 2021-02-19 DIAGNOSIS — E871 Hypo-osmolality and hyponatremia: Secondary | ICD-10-CM | POA: Diagnosis not present

## 2021-02-19 DIAGNOSIS — I5021 Acute systolic (congestive) heart failure: Secondary | ICD-10-CM | POA: Diagnosis not present

## 2021-02-19 DIAGNOSIS — D689 Coagulation defect, unspecified: Secondary | ICD-10-CM | POA: Diagnosis not present

## 2021-02-20 ENCOUNTER — Ambulatory Visit (INDEPENDENT_AMBULATORY_CARE_PROVIDER_SITE_OTHER): Payer: Medicare Other | Admitting: Orthopaedic Surgery

## 2021-02-20 ENCOUNTER — Other Ambulatory Visit (HOSPITAL_BASED_OUTPATIENT_CLINIC_OR_DEPARTMENT_OTHER): Payer: Self-pay | Admitting: Orthopaedic Surgery

## 2021-02-20 ENCOUNTER — Ambulatory Visit (HOSPITAL_BASED_OUTPATIENT_CLINIC_OR_DEPARTMENT_OTHER)
Admission: RE | Admit: 2021-02-20 | Discharge: 2021-02-20 | Disposition: A | Discharge status: Home / Self Care | Payer: Medicare Other | Source: Ambulatory Visit | Attending: Orthopaedic Surgery | Admitting: Orthopaedic Surgery

## 2021-02-20 ENCOUNTER — Other Ambulatory Visit: Payer: Self-pay

## 2021-02-20 DIAGNOSIS — M25552 Pain in left hip: Secondary | ICD-10-CM | POA: Diagnosis not present

## 2021-02-20 DIAGNOSIS — Z9889 Other specified postprocedural states: Secondary | ICD-10-CM

## 2021-02-20 DIAGNOSIS — Z8781 Personal history of (healed) traumatic fracture: Secondary | ICD-10-CM | POA: Insufficient documentation

## 2021-02-20 DIAGNOSIS — Z96642 Presence of left artificial hip joint: Secondary | ICD-10-CM

## 2021-02-20 NOTE — Progress Notes (Signed)
Post Operative Evaluation    Procedure/Date of Surgery: 01/30/21 left hip hemiarthroplasty  Interval History:   Presents 3 weeks following left hip hemiarthroplasty.  Overall he is doing extremely well.  He has been able to walk with physical therapy which he is participating in daily.  He continues to be compliant with anticoagulation.  He is taking oxycodone at night only for pain  PMH/PSH/Family History/Social History/Meds/Allergies:    Past Medical History:  Diagnosis Date   Anemia    Arthritis    knees   Cancer [facility])    prostate   ED (erectile dysfunction)    GERD (gastroesophageal reflux disease)    ocassional    Hard of hearing    Hypertension    Hypertriglyceridemia    Renal failure    Dialysis dependent   Past Surgical History:  Procedure Laterality Date   A/V FISTULAGRAM N/A 07/25/2020   Procedure: A/V FISTULAGRAM - Left Arm;  Surgeon: Elam Dutch, MD;  Location: [place] [facility];  Service: Cardiovascular;  Laterality: N/A;   AV FISTULA PLACEMENT Left 03/04/2020   Procedure: BRACHIOCEPHALIC ARTERIOVENOUS (AV) FISTULA CREATION LEFT;  Surgeon: Elam Dutch, MD;  Location: [facility] OR;  Service: Vascular;  Laterality: Left;   AV FISTULA PLACEMENT Right 07/28/2020   Procedure: RIGHT  ARM ARTERIOVENOUS FISTULA CREATION;  Surgeon: Elam Dutch, MD;  Location: [place];  Service: Vascular;  Laterality: Right;   COLONOSCOPY     HIP ARTHROPLASTY Left 01/30/2021   Procedure: ARTHROPLASTY BIPOLAR HIP (HEMIARTHROPLASTY);  Surgeon: Vanetta Mulders, MD;  Location: [place];  Service: Orthopedics;  Laterality: Left;   IR AV DIALY SHUNT INTRO NEEDLE/INTRACATH INITIAL W/PTA/IMG RIGHT Right 02/09/2021   IR FLUORO GUIDE CV LINE RIGHT  02/11/2021   IR US GUIDE VASC ACCESS RIGHT  02/09/2021   IR US GUIDE VASC ACCESS RIGHT  02/11/2021   renal cyst biopsy     TONSILLECTOMY     Social History   Socioeconomic History   Marital status: Married     Spouse name: Not on file   Number of children: Not on file   Years of education: Not on file   Highest education level: Not on file  Occupational History   Not on file  Tobacco Use   Smoking status: Never   Smokeless tobacco: Never  Vaping Use   Vaping Use: Never used  Substance and Sexual Activity   Alcohol use: Yes    Alcohol/week: 14.0 standard drinks    Types: 14 Standard drinks or equivalent per week    Comment: 2 cocktails per day   Drug use: No   Sexual activity: Not on file  Other Topics Concern   Not on file  Social History Narrative   Not on file   Social Determinants of Health   Financial Resource Strain: Not on file  Food Insecurity: Not on file  Transportation Needs: Not on file  Physical Activity: Not on file  Stress: Not on file  Social Connections: Not on file   Family History  Problem Relation Age of Onset   Cancer Mother    No Known Allergies Current Outpatient Medications  Medication Sig Dispense Refill   acetaminophen (TYLENOL) 650 MG CR tablet Take 650 mg by mouth every 8 (eight) hours as needed for pain.  cetirizine (ZYRTEC) 10 MG tablet Take 10 mg by mouth daily.     metoprolol tartrate (LOPRESSOR) 25 MG tablet Take 0.5 tablets (12.5 mg total) by mouth 3 (three) times daily.     midodrine (PROAMATINE) 5 MG tablet Take 1 tablet (5 mg total) by mouth 3 (three) times daily with meals.     sevelamer carbonate (RENVELA) 800 MG tablet Take 1 tablet (800 mg total) by mouth 3 (three) times daily with meals.     tamsulosin (FLOMAX) 0.4 MG CAPS capsule Take 1 capsule (0.4 mg total) by mouth daily after supper. (Patient taking differently: Take 0.4 mg by mouth in the morning and at bedtime.) 30 capsule 0   No current facility-administered medications for this visit.   No results found.  Review of Systems:   A ROS was performed including pertinent positives and negatives as documented in the HPI.   Musculoskeletal Exam:    There were no vitals  taken for this visit.  Left hip incision is clean dry and intact.  He is able to flex at the hip and extend at the knee.  Stable to dorsiflex left foot.  2+ dorsalis pedis pulse  Imaging:    X-ray AP pelvis and 2 views left hip: Intact left hip hemiarthroplasty  I personally reviewed and interpreted the radiographs.   Assessment:   87 year old male status post left hip hemiarthroplasty 3 weeks overall doing very well.  He will continue to be activity as tolerated.  All questions answered.  Plan :    -Return in 1 month for final check.      I personally saw and evaluated the patient, and participated in the management and treatment plan.  Vanetta Mulders, MD Attending Physician, Orthopedic Surgery  This document was dictated using Dragon voice recognition software. A reasonable attempt at proof reading has been made to minimize errors.

## 2021-02-21 DIAGNOSIS — D509 Iron deficiency anemia, unspecified: Secondary | ICD-10-CM | POA: Diagnosis not present

## 2021-02-21 DIAGNOSIS — D472 Monoclonal gammopathy: Secondary | ICD-10-CM | POA: Diagnosis not present

## 2021-02-21 DIAGNOSIS — N2581 Secondary hyperparathyroidism of renal origin: Secondary | ICD-10-CM | POA: Diagnosis not present

## 2021-02-21 DIAGNOSIS — Z23 Encounter for immunization: Secondary | ICD-10-CM | POA: Diagnosis not present

## 2021-02-21 DIAGNOSIS — D689 Coagulation defect, unspecified: Secondary | ICD-10-CM | POA: Diagnosis not present

## 2021-02-21 DIAGNOSIS — N186 End stage renal disease: Secondary | ICD-10-CM | POA: Diagnosis not present

## 2021-02-21 DIAGNOSIS — Z992 Dependence on renal dialysis: Secondary | ICD-10-CM | POA: Diagnosis not present

## 2021-02-24 DIAGNOSIS — D472 Monoclonal gammopathy: Secondary | ICD-10-CM | POA: Diagnosis not present

## 2021-02-24 DIAGNOSIS — N2581 Secondary hyperparathyroidism of renal origin: Secondary | ICD-10-CM | POA: Diagnosis not present

## 2021-02-24 DIAGNOSIS — D689 Coagulation defect, unspecified: Secondary | ICD-10-CM | POA: Diagnosis not present

## 2021-02-24 DIAGNOSIS — N186 End stage renal disease: Secondary | ICD-10-CM | POA: Diagnosis not present

## 2021-02-24 DIAGNOSIS — Z992 Dependence on renal dialysis: Secondary | ICD-10-CM | POA: Diagnosis not present

## 2021-02-24 DIAGNOSIS — D509 Iron deficiency anemia, unspecified: Secondary | ICD-10-CM | POA: Diagnosis not present

## 2021-02-26 DIAGNOSIS — N186 End stage renal disease: Secondary | ICD-10-CM | POA: Diagnosis not present

## 2021-02-26 DIAGNOSIS — D472 Monoclonal gammopathy: Secondary | ICD-10-CM | POA: Diagnosis not present

## 2021-02-26 DIAGNOSIS — D689 Coagulation defect, unspecified: Secondary | ICD-10-CM | POA: Diagnosis not present

## 2021-02-26 DIAGNOSIS — N2581 Secondary hyperparathyroidism of renal origin: Secondary | ICD-10-CM | POA: Diagnosis not present

## 2021-02-26 DIAGNOSIS — D509 Iron deficiency anemia, unspecified: Secondary | ICD-10-CM | POA: Diagnosis not present

## 2021-02-26 DIAGNOSIS — Z992 Dependence on renal dialysis: Secondary | ICD-10-CM | POA: Diagnosis not present

## 2021-02-27 DIAGNOSIS — I480 Paroxysmal atrial fibrillation: Secondary | ICD-10-CM | POA: Diagnosis not present

## 2021-02-27 DIAGNOSIS — D72829 Elevated white blood cell count, unspecified: Secondary | ICD-10-CM | POA: Diagnosis not present

## 2021-02-27 DIAGNOSIS — T82590A Other mechanical complication of surgically created arteriovenous fistula, initial encounter: Secondary | ICD-10-CM | POA: Diagnosis not present

## 2021-02-27 DIAGNOSIS — I5021 Acute systolic (congestive) heart failure: Secondary | ICD-10-CM | POA: Diagnosis not present

## 2021-02-27 DIAGNOSIS — N186 End stage renal disease: Secondary | ICD-10-CM | POA: Diagnosis not present

## 2021-02-27 DIAGNOSIS — K567 Ileus, unspecified: Secondary | ICD-10-CM | POA: Diagnosis not present

## 2021-02-27 DIAGNOSIS — S72002D Fracture of unspecified part of neck of left femur, subsequent encounter for closed fracture with routine healing: Secondary | ICD-10-CM | POA: Diagnosis not present

## 2021-02-28 DIAGNOSIS — Z992 Dependence on renal dialysis: Secondary | ICD-10-CM | POA: Diagnosis not present

## 2021-02-28 DIAGNOSIS — D509 Iron deficiency anemia, unspecified: Secondary | ICD-10-CM | POA: Diagnosis not present

## 2021-02-28 DIAGNOSIS — N2581 Secondary hyperparathyroidism of renal origin: Secondary | ICD-10-CM | POA: Diagnosis not present

## 2021-02-28 DIAGNOSIS — D472 Monoclonal gammopathy: Secondary | ICD-10-CM | POA: Diagnosis not present

## 2021-02-28 DIAGNOSIS — D689 Coagulation defect, unspecified: Secondary | ICD-10-CM | POA: Diagnosis not present

## 2021-02-28 DIAGNOSIS — N186 End stage renal disease: Secondary | ICD-10-CM | POA: Diagnosis not present

## 2021-03-03 DIAGNOSIS — N186 End stage renal disease: Secondary | ICD-10-CM | POA: Diagnosis not present

## 2021-03-03 DIAGNOSIS — D689 Coagulation defect, unspecified: Secondary | ICD-10-CM | POA: Diagnosis not present

## 2021-03-03 DIAGNOSIS — N2581 Secondary hyperparathyroidism of renal origin: Secondary | ICD-10-CM | POA: Diagnosis not present

## 2021-03-03 DIAGNOSIS — Z992 Dependence on renal dialysis: Secondary | ICD-10-CM | POA: Diagnosis not present

## 2021-03-03 DIAGNOSIS — D509 Iron deficiency anemia, unspecified: Secondary | ICD-10-CM | POA: Diagnosis not present

## 2021-03-03 DIAGNOSIS — D472 Monoclonal gammopathy: Secondary | ICD-10-CM | POA: Diagnosis not present

## 2021-03-05 DIAGNOSIS — D472 Monoclonal gammopathy: Secondary | ICD-10-CM | POA: Diagnosis not present

## 2021-03-05 DIAGNOSIS — N2581 Secondary hyperparathyroidism of renal origin: Secondary | ICD-10-CM | POA: Diagnosis not present

## 2021-03-05 DIAGNOSIS — Z992 Dependence on renal dialysis: Secondary | ICD-10-CM | POA: Diagnosis not present

## 2021-03-05 DIAGNOSIS — N186 End stage renal disease: Secondary | ICD-10-CM | POA: Diagnosis not present

## 2021-03-05 DIAGNOSIS — D509 Iron deficiency anemia, unspecified: Secondary | ICD-10-CM | POA: Diagnosis not present

## 2021-03-05 DIAGNOSIS — D689 Coagulation defect, unspecified: Secondary | ICD-10-CM | POA: Diagnosis not present

## 2021-03-06 ENCOUNTER — Telehealth: Payer: Self-pay | Admitting: Orthopaedic Surgery

## 2021-03-06 DIAGNOSIS — H9190 Unspecified hearing loss, unspecified ear: Secondary | ICD-10-CM | POA: Diagnosis not present

## 2021-03-06 DIAGNOSIS — I120 Hypertensive chronic kidney disease with stage 5 chronic kidney disease or end stage renal disease: Secondary | ICD-10-CM | POA: Diagnosis not present

## 2021-03-06 DIAGNOSIS — K219 Gastro-esophageal reflux disease without esophagitis: Secondary | ICD-10-CM | POA: Diagnosis not present

## 2021-03-06 DIAGNOSIS — D631 Anemia in chronic kidney disease: Secondary | ICD-10-CM | POA: Diagnosis not present

## 2021-03-06 DIAGNOSIS — Z7982 Long term (current) use of aspirin: Secondary | ICD-10-CM | POA: Diagnosis not present

## 2021-03-06 DIAGNOSIS — E781 Pure hyperglyceridemia: Secondary | ICD-10-CM | POA: Diagnosis not present

## 2021-03-06 DIAGNOSIS — Z9181 History of falling: Secondary | ICD-10-CM | POA: Diagnosis not present

## 2021-03-06 DIAGNOSIS — Z96642 Presence of left artificial hip joint: Secondary | ICD-10-CM | POA: Diagnosis not present

## 2021-03-06 DIAGNOSIS — M170 Bilateral primary osteoarthritis of knee: Secondary | ICD-10-CM | POA: Diagnosis not present

## 2021-03-06 DIAGNOSIS — Z992 Dependence on renal dialysis: Secondary | ICD-10-CM | POA: Diagnosis not present

## 2021-03-06 DIAGNOSIS — Z471 Aftercare following joint replacement surgery: Secondary | ICD-10-CM | POA: Diagnosis not present

## 2021-03-06 DIAGNOSIS — N186 End stage renal disease: Secondary | ICD-10-CM | POA: Diagnosis not present

## 2021-03-06 NOTE — Telephone Encounter (Signed)
P.T calling on behalf of pt to get verbal orders . The best call back number for her is [phone].

## 2021-03-07 DIAGNOSIS — Z992 Dependence on renal dialysis: Secondary | ICD-10-CM | POA: Diagnosis not present

## 2021-03-07 DIAGNOSIS — N2581 Secondary hyperparathyroidism of renal origin: Secondary | ICD-10-CM | POA: Diagnosis not present

## 2021-03-07 DIAGNOSIS — D689 Coagulation defect, unspecified: Secondary | ICD-10-CM | POA: Diagnosis not present

## 2021-03-07 DIAGNOSIS — N186 End stage renal disease: Secondary | ICD-10-CM | POA: Diagnosis not present

## 2021-03-07 DIAGNOSIS — D509 Iron deficiency anemia, unspecified: Secondary | ICD-10-CM | POA: Diagnosis not present

## 2021-03-07 DIAGNOSIS — D472 Monoclonal gammopathy: Secondary | ICD-10-CM | POA: Diagnosis not present

## 2021-03-09 DIAGNOSIS — Z09 Encounter for follow-up examination after completed treatment for conditions other than malignant neoplasm: Secondary | ICD-10-CM | POA: Diagnosis not present

## 2021-03-09 DIAGNOSIS — I480 Paroxysmal atrial fibrillation: Secondary | ICD-10-CM | POA: Diagnosis not present

## 2021-03-09 DIAGNOSIS — N186 End stage renal disease: Secondary | ICD-10-CM | POA: Diagnosis not present

## 2021-03-09 DIAGNOSIS — N2581 Secondary hyperparathyroidism of renal origin: Secondary | ICD-10-CM | POA: Diagnosis not present

## 2021-03-09 DIAGNOSIS — Z992 Dependence on renal dialysis: Secondary | ICD-10-CM | POA: Diagnosis not present

## 2021-03-10 DIAGNOSIS — N186 End stage renal disease: Secondary | ICD-10-CM | POA: Diagnosis not present

## 2021-03-10 DIAGNOSIS — Z992 Dependence on renal dialysis: Secondary | ICD-10-CM | POA: Diagnosis not present

## 2021-03-10 DIAGNOSIS — D689 Coagulation defect, unspecified: Secondary | ICD-10-CM | POA: Diagnosis not present

## 2021-03-10 DIAGNOSIS — N2581 Secondary hyperparathyroidism of renal origin: Secondary | ICD-10-CM | POA: Diagnosis not present

## 2021-03-10 DIAGNOSIS — D509 Iron deficiency anemia, unspecified: Secondary | ICD-10-CM | POA: Diagnosis not present

## 2021-03-10 DIAGNOSIS — D472 Monoclonal gammopathy: Secondary | ICD-10-CM | POA: Diagnosis not present

## 2021-03-11 DIAGNOSIS — Z9181 History of falling: Secondary | ICD-10-CM | POA: Diagnosis not present

## 2021-03-11 DIAGNOSIS — Z7982 Long term (current) use of aspirin: Secondary | ICD-10-CM | POA: Diagnosis not present

## 2021-03-11 DIAGNOSIS — E781 Pure hyperglyceridemia: Secondary | ICD-10-CM | POA: Diagnosis not present

## 2021-03-11 DIAGNOSIS — M170 Bilateral primary osteoarthritis of knee: Secondary | ICD-10-CM | POA: Diagnosis not present

## 2021-03-11 DIAGNOSIS — Z96642 Presence of left artificial hip joint: Secondary | ICD-10-CM | POA: Diagnosis not present

## 2021-03-11 DIAGNOSIS — K219 Gastro-esophageal reflux disease without esophagitis: Secondary | ICD-10-CM | POA: Diagnosis not present

## 2021-03-11 DIAGNOSIS — D631 Anemia in chronic kidney disease: Secondary | ICD-10-CM | POA: Diagnosis not present

## 2021-03-11 DIAGNOSIS — H9190 Unspecified hearing loss, unspecified ear: Secondary | ICD-10-CM | POA: Diagnosis not present

## 2021-03-11 DIAGNOSIS — Z471 Aftercare following joint replacement surgery: Secondary | ICD-10-CM | POA: Diagnosis not present

## 2021-03-11 DIAGNOSIS — N186 End stage renal disease: Secondary | ICD-10-CM | POA: Diagnosis not present

## 2021-03-11 DIAGNOSIS — I120 Hypertensive chronic kidney disease with stage 5 chronic kidney disease or end stage renal disease: Secondary | ICD-10-CM | POA: Diagnosis not present

## 2021-03-11 DIAGNOSIS — Z992 Dependence on renal dialysis: Secondary | ICD-10-CM | POA: Diagnosis not present

## 2021-03-12 DIAGNOSIS — N2581 Secondary hyperparathyroidism of renal origin: Secondary | ICD-10-CM | POA: Diagnosis not present

## 2021-03-12 DIAGNOSIS — D689 Coagulation defect, unspecified: Secondary | ICD-10-CM | POA: Diagnosis not present

## 2021-03-12 DIAGNOSIS — D472 Monoclonal gammopathy: Secondary | ICD-10-CM | POA: Diagnosis not present

## 2021-03-12 DIAGNOSIS — Z992 Dependence on renal dialysis: Secondary | ICD-10-CM | POA: Diagnosis not present

## 2021-03-12 DIAGNOSIS — D509 Iron deficiency anemia, unspecified: Secondary | ICD-10-CM | POA: Diagnosis not present

## 2021-03-12 DIAGNOSIS — N186 End stage renal disease: Secondary | ICD-10-CM | POA: Diagnosis not present

## 2021-03-13 DIAGNOSIS — Z992 Dependence on renal dialysis: Secondary | ICD-10-CM | POA: Diagnosis not present

## 2021-03-13 DIAGNOSIS — E781 Pure hyperglyceridemia: Secondary | ICD-10-CM | POA: Diagnosis not present

## 2021-03-13 DIAGNOSIS — N186 End stage renal disease: Secondary | ICD-10-CM | POA: Diagnosis not present

## 2021-03-13 DIAGNOSIS — H9190 Unspecified hearing loss, unspecified ear: Secondary | ICD-10-CM | POA: Diagnosis not present

## 2021-03-13 DIAGNOSIS — Z471 Aftercare following joint replacement surgery: Secondary | ICD-10-CM | POA: Diagnosis not present

## 2021-03-13 DIAGNOSIS — K219 Gastro-esophageal reflux disease without esophagitis: Secondary | ICD-10-CM | POA: Diagnosis not present

## 2021-03-13 DIAGNOSIS — Z9181 History of falling: Secondary | ICD-10-CM | POA: Diagnosis not present

## 2021-03-13 DIAGNOSIS — Z96642 Presence of left artificial hip joint: Secondary | ICD-10-CM | POA: Diagnosis not present

## 2021-03-13 DIAGNOSIS — M170 Bilateral primary osteoarthritis of knee: Secondary | ICD-10-CM | POA: Diagnosis not present

## 2021-03-13 DIAGNOSIS — Z7982 Long term (current) use of aspirin: Secondary | ICD-10-CM | POA: Diagnosis not present

## 2021-03-13 DIAGNOSIS — I120 Hypertensive chronic kidney disease with stage 5 chronic kidney disease or end stage renal disease: Secondary | ICD-10-CM | POA: Diagnosis not present

## 2021-03-13 DIAGNOSIS — D631 Anemia in chronic kidney disease: Secondary | ICD-10-CM | POA: Diagnosis not present

## 2021-03-14 DIAGNOSIS — D472 Monoclonal gammopathy: Secondary | ICD-10-CM | POA: Diagnosis not present

## 2021-03-14 DIAGNOSIS — D689 Coagulation defect, unspecified: Secondary | ICD-10-CM | POA: Diagnosis not present

## 2021-03-14 DIAGNOSIS — N2581 Secondary hyperparathyroidism of renal origin: Secondary | ICD-10-CM | POA: Diagnosis not present

## 2021-03-14 DIAGNOSIS — D509 Iron deficiency anemia, unspecified: Secondary | ICD-10-CM | POA: Diagnosis not present

## 2021-03-14 DIAGNOSIS — Z992 Dependence on renal dialysis: Secondary | ICD-10-CM | POA: Diagnosis not present

## 2021-03-14 DIAGNOSIS — N186 End stage renal disease: Secondary | ICD-10-CM | POA: Diagnosis not present

## 2021-03-16 DIAGNOSIS — Z7982 Long term (current) use of aspirin: Secondary | ICD-10-CM | POA: Diagnosis not present

## 2021-03-16 DIAGNOSIS — H9190 Unspecified hearing loss, unspecified ear: Secondary | ICD-10-CM | POA: Diagnosis not present

## 2021-03-16 DIAGNOSIS — Z9181 History of falling: Secondary | ICD-10-CM | POA: Diagnosis not present

## 2021-03-16 DIAGNOSIS — Z96642 Presence of left artificial hip joint: Secondary | ICD-10-CM | POA: Diagnosis not present

## 2021-03-16 DIAGNOSIS — Z992 Dependence on renal dialysis: Secondary | ICD-10-CM | POA: Diagnosis not present

## 2021-03-16 DIAGNOSIS — N186 End stage renal disease: Secondary | ICD-10-CM | POA: Diagnosis not present

## 2021-03-16 DIAGNOSIS — I120 Hypertensive chronic kidney disease with stage 5 chronic kidney disease or end stage renal disease: Secondary | ICD-10-CM | POA: Diagnosis not present

## 2021-03-16 DIAGNOSIS — M170 Bilateral primary osteoarthritis of knee: Secondary | ICD-10-CM | POA: Diagnosis not present

## 2021-03-16 DIAGNOSIS — D631 Anemia in chronic kidney disease: Secondary | ICD-10-CM | POA: Diagnosis not present

## 2021-03-16 DIAGNOSIS — Z471 Aftercare following joint replacement surgery: Secondary | ICD-10-CM | POA: Diagnosis not present

## 2021-03-16 DIAGNOSIS — E781 Pure hyperglyceridemia: Secondary | ICD-10-CM | POA: Diagnosis not present

## 2021-03-16 DIAGNOSIS — K219 Gastro-esophageal reflux disease without esophagitis: Secondary | ICD-10-CM | POA: Diagnosis not present

## 2021-03-17 DIAGNOSIS — N2581 Secondary hyperparathyroidism of renal origin: Secondary | ICD-10-CM | POA: Diagnosis not present

## 2021-03-17 DIAGNOSIS — D689 Coagulation defect, unspecified: Secondary | ICD-10-CM | POA: Diagnosis not present

## 2021-03-17 DIAGNOSIS — Z992 Dependence on renal dialysis: Secondary | ICD-10-CM | POA: Diagnosis not present

## 2021-03-17 DIAGNOSIS — N186 End stage renal disease: Secondary | ICD-10-CM | POA: Diagnosis not present

## 2021-03-17 DIAGNOSIS — D509 Iron deficiency anemia, unspecified: Secondary | ICD-10-CM | POA: Diagnosis not present

## 2021-03-17 DIAGNOSIS — D472 Monoclonal gammopathy: Secondary | ICD-10-CM | POA: Diagnosis not present

## 2021-03-20 DIAGNOSIS — N2581 Secondary hyperparathyroidism of renal origin: Secondary | ICD-10-CM | POA: Diagnosis not present

## 2021-03-20 DIAGNOSIS — D689 Coagulation defect, unspecified: Secondary | ICD-10-CM | POA: Diagnosis not present

## 2021-03-20 DIAGNOSIS — Z992 Dependence on renal dialysis: Secondary | ICD-10-CM | POA: Diagnosis not present

## 2021-03-20 DIAGNOSIS — N186 End stage renal disease: Secondary | ICD-10-CM | POA: Diagnosis not present

## 2021-03-20 DIAGNOSIS — D472 Monoclonal gammopathy: Secondary | ICD-10-CM | POA: Diagnosis not present

## 2021-03-20 DIAGNOSIS — D509 Iron deficiency anemia, unspecified: Secondary | ICD-10-CM | POA: Diagnosis not present

## 2021-03-22 DIAGNOSIS — N186 End stage renal disease: Secondary | ICD-10-CM | POA: Diagnosis not present

## 2021-03-22 DIAGNOSIS — D509 Iron deficiency anemia, unspecified: Secondary | ICD-10-CM | POA: Diagnosis not present

## 2021-03-22 DIAGNOSIS — D472 Monoclonal gammopathy: Secondary | ICD-10-CM | POA: Diagnosis not present

## 2021-03-22 DIAGNOSIS — N2581 Secondary hyperparathyroidism of renal origin: Secondary | ICD-10-CM | POA: Diagnosis not present

## 2021-03-22 DIAGNOSIS — D689 Coagulation defect, unspecified: Secondary | ICD-10-CM | POA: Diagnosis not present

## 2021-03-22 DIAGNOSIS — Z992 Dependence on renal dialysis: Secondary | ICD-10-CM | POA: Diagnosis not present

## 2021-03-24 DIAGNOSIS — N186 End stage renal disease: Secondary | ICD-10-CM | POA: Diagnosis not present

## 2021-03-24 DIAGNOSIS — Z992 Dependence on renal dialysis: Secondary | ICD-10-CM | POA: Diagnosis not present

## 2021-03-24 DIAGNOSIS — D472 Monoclonal gammopathy: Secondary | ICD-10-CM | POA: Diagnosis not present

## 2021-03-24 DIAGNOSIS — N2581 Secondary hyperparathyroidism of renal origin: Secondary | ICD-10-CM | POA: Diagnosis not present

## 2021-03-24 DIAGNOSIS — D509 Iron deficiency anemia, unspecified: Secondary | ICD-10-CM | POA: Diagnosis not present

## 2021-03-24 DIAGNOSIS — D689 Coagulation defect, unspecified: Secondary | ICD-10-CM | POA: Diagnosis not present

## 2021-03-25 DIAGNOSIS — Z7982 Long term (current) use of aspirin: Secondary | ICD-10-CM | POA: Diagnosis not present

## 2021-03-25 DIAGNOSIS — Z471 Aftercare following joint replacement surgery: Secondary | ICD-10-CM | POA: Diagnosis not present

## 2021-03-25 DIAGNOSIS — I120 Hypertensive chronic kidney disease with stage 5 chronic kidney disease or end stage renal disease: Secondary | ICD-10-CM | POA: Diagnosis not present

## 2021-03-25 DIAGNOSIS — K219 Gastro-esophageal reflux disease without esophagitis: Secondary | ICD-10-CM | POA: Diagnosis not present

## 2021-03-25 DIAGNOSIS — Z96642 Presence of left artificial hip joint: Secondary | ICD-10-CM | POA: Diagnosis not present

## 2021-03-25 DIAGNOSIS — N186 End stage renal disease: Secondary | ICD-10-CM | POA: Diagnosis not present

## 2021-03-25 DIAGNOSIS — D631 Anemia in chronic kidney disease: Secondary | ICD-10-CM | POA: Diagnosis not present

## 2021-03-25 DIAGNOSIS — H9190 Unspecified hearing loss, unspecified ear: Secondary | ICD-10-CM | POA: Diagnosis not present

## 2021-03-25 DIAGNOSIS — M170 Bilateral primary osteoarthritis of knee: Secondary | ICD-10-CM | POA: Diagnosis not present

## 2021-03-25 DIAGNOSIS — I129 Hypertensive chronic kidney disease with stage 1 through stage 4 chronic kidney disease, or unspecified chronic kidney disease: Secondary | ICD-10-CM | POA: Diagnosis not present

## 2021-03-25 DIAGNOSIS — Z992 Dependence on renal dialysis: Secondary | ICD-10-CM | POA: Diagnosis not present

## 2021-03-25 DIAGNOSIS — E781 Pure hyperglyceridemia: Secondary | ICD-10-CM | POA: Diagnosis not present

## 2021-03-25 DIAGNOSIS — Z9181 History of falling: Secondary | ICD-10-CM | POA: Diagnosis not present

## 2021-03-26 ENCOUNTER — Encounter (HOSPITAL_BASED_OUTPATIENT_CLINIC_OR_DEPARTMENT_OTHER): Payer: Medicare Other | Admitting: Orthopaedic Surgery

## 2021-03-26 DIAGNOSIS — D472 Monoclonal gammopathy: Secondary | ICD-10-CM | POA: Diagnosis not present

## 2021-03-26 DIAGNOSIS — N2581 Secondary hyperparathyroidism of renal origin: Secondary | ICD-10-CM | POA: Diagnosis not present

## 2021-03-26 DIAGNOSIS — N186 End stage renal disease: Secondary | ICD-10-CM | POA: Diagnosis not present

## 2021-03-26 DIAGNOSIS — D509 Iron deficiency anemia, unspecified: Secondary | ICD-10-CM | POA: Diagnosis not present

## 2021-03-26 DIAGNOSIS — Z992 Dependence on renal dialysis: Secondary | ICD-10-CM | POA: Diagnosis not present

## 2021-03-26 DIAGNOSIS — D689 Coagulation defect, unspecified: Secondary | ICD-10-CM | POA: Diagnosis not present

## 2021-03-27 ENCOUNTER — Other Ambulatory Visit: Payer: Self-pay

## 2021-03-27 ENCOUNTER — Other Ambulatory Visit (HOSPITAL_BASED_OUTPATIENT_CLINIC_OR_DEPARTMENT_OTHER): Payer: Self-pay | Admitting: Orthopaedic Surgery

## 2021-03-27 ENCOUNTER — Other Ambulatory Visit (HOSPITAL_BASED_OUTPATIENT_CLINIC_OR_DEPARTMENT_OTHER): Payer: Self-pay

## 2021-03-27 ENCOUNTER — Ambulatory Visit (INDEPENDENT_AMBULATORY_CARE_PROVIDER_SITE_OTHER): Payer: Medicare Other | Admitting: Orthopaedic Surgery

## 2021-03-27 ENCOUNTER — Ambulatory Visit (HOSPITAL_BASED_OUTPATIENT_CLINIC_OR_DEPARTMENT_OTHER)
Admission: RE | Admit: 2021-03-27 | Discharge: 2021-03-27 | Disposition: A | Discharge status: Home / Self Care | Payer: Medicare Other | Source: Ambulatory Visit | Attending: Orthopaedic Surgery | Admitting: Orthopaedic Surgery

## 2021-03-27 DIAGNOSIS — Z96642 Presence of left artificial hip joint: Secondary | ICD-10-CM | POA: Diagnosis not present

## 2021-03-27 DIAGNOSIS — Z8781 Personal history of (healed) traumatic fracture: Secondary | ICD-10-CM | POA: Diagnosis not present

## 2021-03-27 DIAGNOSIS — K219 Gastro-esophageal reflux disease without esophagitis: Secondary | ICD-10-CM | POA: Diagnosis not present

## 2021-03-27 DIAGNOSIS — E781 Pure hyperglyceridemia: Secondary | ICD-10-CM | POA: Diagnosis not present

## 2021-03-27 DIAGNOSIS — N186 End stage renal disease: Secondary | ICD-10-CM | POA: Diagnosis not present

## 2021-03-27 DIAGNOSIS — Z992 Dependence on renal dialysis: Secondary | ICD-10-CM | POA: Diagnosis not present

## 2021-03-27 DIAGNOSIS — Z9889 Other specified postprocedural states: Secondary | ICD-10-CM | POA: Insufficient documentation

## 2021-03-27 DIAGNOSIS — Z7982 Long term (current) use of aspirin: Secondary | ICD-10-CM | POA: Diagnosis not present

## 2021-03-27 DIAGNOSIS — Z9181 History of falling: Secondary | ICD-10-CM | POA: Diagnosis not present

## 2021-03-27 DIAGNOSIS — M25552 Pain in left hip: Secondary | ICD-10-CM | POA: Diagnosis not present

## 2021-03-27 DIAGNOSIS — M170 Bilateral primary osteoarthritis of knee: Secondary | ICD-10-CM | POA: Diagnosis not present

## 2021-03-27 DIAGNOSIS — H9190 Unspecified hearing loss, unspecified ear: Secondary | ICD-10-CM | POA: Diagnosis not present

## 2021-03-27 DIAGNOSIS — D631 Anemia in chronic kidney disease: Secondary | ICD-10-CM | POA: Diagnosis not present

## 2021-03-27 DIAGNOSIS — I120 Hypertensive chronic kidney disease with stage 5 chronic kidney disease or end stage renal disease: Secondary | ICD-10-CM | POA: Diagnosis not present

## 2021-03-27 DIAGNOSIS — Z471 Aftercare following joint replacement surgery: Secondary | ICD-10-CM | POA: Diagnosis not present

## 2021-03-27 MED ORDER — METHOCARBAMOL 500 MG PO TABS
500.0000 mg | ORAL_TABLET | Freq: Two times a day (BID) | ORAL | 3 refills | Status: DC
  Filled 2021-03-27: qty 30, 15d supply, fill #0
  Filled 2021-04-21: qty 30, 15d supply, fill #1
  Filled 2021-05-08: qty 30, 15d supply, fill #2

## 2021-03-27 NOTE — Progress Notes (Signed)
Post Operative Evaluation    Procedure/Date of Surgery: 01/30/21 left hip hemiarthroplasty  Interval History:   Presents today for follow-up after left hip hemiarthroplasty on the date noted above.  Overall he is doing very well.  He has been able to mobilize with a walker.  He has some pain in the quad particularly when waking up but once he gets: He has no pain after this.  PMH/PSH/Family History/Social History/Meds/Allergies:    Past Medical History:  Diagnosis Date   Anemia    Arthritis    knees   Cancer (HCC)    prostate   ED (erectile dysfunction)    GERD (gastroesophageal reflux disease)    ocassional    Hard of hearing    Hypertension    Hypertriglyceridemia    Renal failure    Dialysis dependent   Past Surgical History:  Procedure Laterality Date   A/V FISTULAGRAM N/A 07/25/2020   Procedure: A/V FISTULAGRAM - Left Arm;  Surgeon: Elam Dutch, MD;  Location: [place] [facility];  Service: Cardiovascular;  Laterality: N/A;   AV FISTULA PLACEMENT Left 03/04/2020   Procedure: BRACHIOCEPHALIC ARTERIOVENOUS (AV) FISTULA CREATION LEFT;  Surgeon: Elam Dutch, MD;  Location[facility];  Service: Vascular;  Laterality: Left;   AV FISTULA PLACEMENT Right 07/28/2020   Procedure: RIGHT  ARM ARTERIOVENOUS FISTULA CREATION;  Surgeon: Elam Dutch, MD;  Location: [place];  Service: Vascular;  Laterality: Right;   COLONOSCOPY     HIP ARTHROPLASTY Left 01/30/2021   Procedure: ARTHROPLASTY BIPOLAR HIP (HEMIARTHROPLASTY);  Surgeon: Vanetta Mulders, MD;  Location: [place];  Service: Orthopedics;  Laterality: Left;   IR AV DIALY SHUNT INTRO NEEDLE/INTRACATH INITIAL W/PTA/IMG RIGHT Right 02/09/2021   IR FLUORO GUIDE CV LINE RIGHT  02/11/2021   IR US GUIDE VASC ACCESS RIGHT  02/09/2021   IR US GUIDE VASC ACCESS RIGHT  02/11/2021   renal cyst biopsy     TONSILLECTOMY     Social History   Socioeconomic History   Marital status: Married    Spouse  name: Not on file   Number of children: Not on file   Years of education: Not on file   Highest education level: Not on file  Occupational History   Not on file  Tobacco Use   Smoking status: Never   Smokeless tobacco: Never  Vaping Use   Vaping Use: Never used  Substance and Sexual Activity   Alcohol use: Yes    Alcohol/week: 14.0 standard drinks    Types: 14 Standard drinks or equivalent per week    Comment: 2 cocktails per day   Drug use: No   Sexual activity: Not on file  Other Topics Concern   Not on file  Social History Narrative   Not on file   Social Determinants of Health   Financial Resource Strain: Not on file  Food Insecurity: Not on file  Transportation Needs: Not on file  Physical Activity: Not on file  Stress: Not on file  Social Connections: Not on file   Family History  Problem Relation Age of Onset   Cancer Mother    No Known Allergies Current Outpatient Medications  Medication Sig Dispense Refill   methocarbamol (ROBAXIN) 500 MG tablet Take 1 tablet (500 mg total) by mouth in the morning and at bedtime.  30 tablet 3   acetaminophen (TYLENOL) 650 MG CR tablet Take 650 mg by mouth every 8 (eight) hours as needed for pain.     cetirizine (ZYRTEC) 10 MG tablet Take 10 mg by mouth daily.     metoprolol tartrate (LOPRESSOR) 25 MG tablet Take 0.5 tablets (12.5 mg total) by mouth 3 (three) times daily.     midodrine (PROAMATINE) 5 MG tablet Take 1 tablet (5 mg total) by mouth 3 (three) times daily with meals.     sevelamer carbonate (RENVELA) 800 MG tablet Take 1 tablet (800 mg total) by mouth 3 (three) times daily with meals.     tamsulosin (FLOMAX) 0.4 MG CAPS capsule Take 1 capsule (0.4 mg total) by mouth daily after supper. (Patient taking differently: Take 0.4 mg by mouth in the morning and at bedtime.) 30 capsule 0   No current facility-administered medications for this visit.   No results found.  Review of Systems:   A ROS was performed including  pertinent positives and negatives as documented in the HPI.   Musculoskeletal Exam:    There were no vitals taken for this visit.  Left hip incision is clean dry and intact.  He is able to flex at the hip and extend at the knee.  Stable to dorsiflex left foot.  2+ dorsalis pedis pulse  Imaging:    X-ray AP pelvis and 2 views left hip: Intact left hip hemiarthroplasty  I personally reviewed and interpreted the radiographs.   Assessment:   87 year old male status post left hip hemiarthroplasty 2 months out.  Overall he is doing extremely well.  I provided him with a prescription for Robaxin.  He will follow-up with me on an as-needed basis Plan :    -Return to clinic as needed      I personally saw and evaluated the patient, and participated in the management and treatment plan.  Vanetta Mulders, MD Attending Physician, Orthopedic Surgery  This document was dictated using Dragon voice recognition software. A reasonable attempt at proof reading has been made to minimize errors.

## 2021-03-28 DIAGNOSIS — N186 End stage renal disease: Secondary | ICD-10-CM | POA: Diagnosis not present

## 2021-03-28 DIAGNOSIS — D472 Monoclonal gammopathy: Secondary | ICD-10-CM | POA: Diagnosis not present

## 2021-03-28 DIAGNOSIS — Z992 Dependence on renal dialysis: Secondary | ICD-10-CM | POA: Diagnosis not present

## 2021-03-28 DIAGNOSIS — D509 Iron deficiency anemia, unspecified: Secondary | ICD-10-CM | POA: Diagnosis not present

## 2021-03-28 DIAGNOSIS — N2581 Secondary hyperparathyroidism of renal origin: Secondary | ICD-10-CM | POA: Diagnosis not present

## 2021-03-28 DIAGNOSIS — D689 Coagulation defect, unspecified: Secondary | ICD-10-CM | POA: Diagnosis not present

## 2021-03-29 DIAGNOSIS — S72002D Fracture of unspecified part of neck of left femur, subsequent encounter for closed fracture with routine healing: Secondary | ICD-10-CM | POA: Diagnosis not present

## 2021-03-29 DIAGNOSIS — I5041 Acute combined systolic (congestive) and diastolic (congestive) heart failure: Secondary | ICD-10-CM | POA: Diagnosis not present

## 2021-03-29 DIAGNOSIS — Z9181 History of falling: Secondary | ICD-10-CM | POA: Diagnosis not present

## 2021-03-29 DIAGNOSIS — N186 End stage renal disease: Secondary | ICD-10-CM | POA: Diagnosis not present

## 2021-03-29 DIAGNOSIS — R0902 Hypoxemia: Secondary | ICD-10-CM | POA: Diagnosis not present

## 2021-03-30 DIAGNOSIS — Z992 Dependence on renal dialysis: Secondary | ICD-10-CM | POA: Diagnosis not present

## 2021-03-30 DIAGNOSIS — Z96642 Presence of left artificial hip joint: Secondary | ICD-10-CM | POA: Diagnosis not present

## 2021-03-30 DIAGNOSIS — D631 Anemia in chronic kidney disease: Secondary | ICD-10-CM | POA: Diagnosis not present

## 2021-03-30 DIAGNOSIS — Z9181 History of falling: Secondary | ICD-10-CM | POA: Diagnosis not present

## 2021-03-30 DIAGNOSIS — I120 Hypertensive chronic kidney disease with stage 5 chronic kidney disease or end stage renal disease: Secondary | ICD-10-CM | POA: Diagnosis not present

## 2021-03-30 DIAGNOSIS — M170 Bilateral primary osteoarthritis of knee: Secondary | ICD-10-CM | POA: Diagnosis not present

## 2021-03-30 DIAGNOSIS — Z471 Aftercare following joint replacement surgery: Secondary | ICD-10-CM | POA: Diagnosis not present

## 2021-03-30 DIAGNOSIS — H9190 Unspecified hearing loss, unspecified ear: Secondary | ICD-10-CM | POA: Diagnosis not present

## 2021-03-30 DIAGNOSIS — Z7982 Long term (current) use of aspirin: Secondary | ICD-10-CM | POA: Diagnosis not present

## 2021-03-30 DIAGNOSIS — E781 Pure hyperglyceridemia: Secondary | ICD-10-CM | POA: Diagnosis not present

## 2021-03-30 DIAGNOSIS — N186 End stage renal disease: Secondary | ICD-10-CM | POA: Diagnosis not present

## 2021-03-30 DIAGNOSIS — K219 Gastro-esophageal reflux disease without esophagitis: Secondary | ICD-10-CM | POA: Diagnosis not present

## 2021-03-31 DIAGNOSIS — D689 Coagulation defect, unspecified: Secondary | ICD-10-CM | POA: Diagnosis not present

## 2021-03-31 DIAGNOSIS — Z992 Dependence on renal dialysis: Secondary | ICD-10-CM | POA: Diagnosis not present

## 2021-03-31 DIAGNOSIS — D472 Monoclonal gammopathy: Secondary | ICD-10-CM | POA: Diagnosis not present

## 2021-03-31 DIAGNOSIS — D509 Iron deficiency anemia, unspecified: Secondary | ICD-10-CM | POA: Diagnosis not present

## 2021-03-31 DIAGNOSIS — N2581 Secondary hyperparathyroidism of renal origin: Secondary | ICD-10-CM | POA: Diagnosis not present

## 2021-03-31 DIAGNOSIS — N186 End stage renal disease: Secondary | ICD-10-CM | POA: Diagnosis not present

## 2021-04-01 DIAGNOSIS — M170 Bilateral primary osteoarthritis of knee: Secondary | ICD-10-CM | POA: Diagnosis not present

## 2021-04-01 DIAGNOSIS — I120 Hypertensive chronic kidney disease with stage 5 chronic kidney disease or end stage renal disease: Secondary | ICD-10-CM | POA: Diagnosis not present

## 2021-04-01 DIAGNOSIS — Z992 Dependence on renal dialysis: Secondary | ICD-10-CM | POA: Diagnosis not present

## 2021-04-01 DIAGNOSIS — N186 End stage renal disease: Secondary | ICD-10-CM | POA: Diagnosis not present

## 2021-04-01 DIAGNOSIS — H9190 Unspecified hearing loss, unspecified ear: Secondary | ICD-10-CM | POA: Diagnosis not present

## 2021-04-01 DIAGNOSIS — Z471 Aftercare following joint replacement surgery: Secondary | ICD-10-CM | POA: Diagnosis not present

## 2021-04-01 DIAGNOSIS — Z96642 Presence of left artificial hip joint: Secondary | ICD-10-CM | POA: Diagnosis not present

## 2021-04-01 DIAGNOSIS — Z9181 History of falling: Secondary | ICD-10-CM | POA: Diagnosis not present

## 2021-04-01 DIAGNOSIS — K219 Gastro-esophageal reflux disease without esophagitis: Secondary | ICD-10-CM | POA: Diagnosis not present

## 2021-04-01 DIAGNOSIS — Z7982 Long term (current) use of aspirin: Secondary | ICD-10-CM | POA: Diagnosis not present

## 2021-04-01 DIAGNOSIS — E781 Pure hyperglyceridemia: Secondary | ICD-10-CM | POA: Diagnosis not present

## 2021-04-01 DIAGNOSIS — D631 Anemia in chronic kidney disease: Secondary | ICD-10-CM | POA: Diagnosis not present

## 2021-04-02 DIAGNOSIS — N2581 Secondary hyperparathyroidism of renal origin: Secondary | ICD-10-CM | POA: Diagnosis not present

## 2021-04-02 DIAGNOSIS — D472 Monoclonal gammopathy: Secondary | ICD-10-CM | POA: Diagnosis not present

## 2021-04-02 DIAGNOSIS — D689 Coagulation defect, unspecified: Secondary | ICD-10-CM | POA: Diagnosis not present

## 2021-04-02 DIAGNOSIS — Z992 Dependence on renal dialysis: Secondary | ICD-10-CM | POA: Diagnosis not present

## 2021-04-02 DIAGNOSIS — N186 End stage renal disease: Secondary | ICD-10-CM | POA: Diagnosis not present

## 2021-04-02 DIAGNOSIS — D509 Iron deficiency anemia, unspecified: Secondary | ICD-10-CM | POA: Diagnosis not present

## 2021-04-04 DIAGNOSIS — D472 Monoclonal gammopathy: Secondary | ICD-10-CM | POA: Diagnosis not present

## 2021-04-04 DIAGNOSIS — N186 End stage renal disease: Secondary | ICD-10-CM | POA: Diagnosis not present

## 2021-04-04 DIAGNOSIS — D689 Coagulation defect, unspecified: Secondary | ICD-10-CM | POA: Diagnosis not present

## 2021-04-04 DIAGNOSIS — N2581 Secondary hyperparathyroidism of renal origin: Secondary | ICD-10-CM | POA: Diagnosis not present

## 2021-04-04 DIAGNOSIS — Z992 Dependence on renal dialysis: Secondary | ICD-10-CM | POA: Diagnosis not present

## 2021-04-04 DIAGNOSIS — D509 Iron deficiency anemia, unspecified: Secondary | ICD-10-CM | POA: Diagnosis not present

## 2021-04-07 DIAGNOSIS — D689 Coagulation defect, unspecified: Secondary | ICD-10-CM | POA: Diagnosis not present

## 2021-04-07 DIAGNOSIS — N2581 Secondary hyperparathyroidism of renal origin: Secondary | ICD-10-CM | POA: Diagnosis not present

## 2021-04-07 DIAGNOSIS — D472 Monoclonal gammopathy: Secondary | ICD-10-CM | POA: Diagnosis not present

## 2021-04-07 DIAGNOSIS — Z992 Dependence on renal dialysis: Secondary | ICD-10-CM | POA: Diagnosis not present

## 2021-04-07 DIAGNOSIS — D509 Iron deficiency anemia, unspecified: Secondary | ICD-10-CM | POA: Diagnosis not present

## 2021-04-07 DIAGNOSIS — N186 End stage renal disease: Secondary | ICD-10-CM | POA: Diagnosis not present

## 2021-04-08 DIAGNOSIS — H9190 Unspecified hearing loss, unspecified ear: Secondary | ICD-10-CM | POA: Diagnosis not present

## 2021-04-08 DIAGNOSIS — K219 Gastro-esophageal reflux disease without esophagitis: Secondary | ICD-10-CM | POA: Diagnosis not present

## 2021-04-08 DIAGNOSIS — D631 Anemia in chronic kidney disease: Secondary | ICD-10-CM | POA: Diagnosis not present

## 2021-04-08 DIAGNOSIS — Z9181 History of falling: Secondary | ICD-10-CM | POA: Diagnosis not present

## 2021-04-08 DIAGNOSIS — Z7982 Long term (current) use of aspirin: Secondary | ICD-10-CM | POA: Diagnosis not present

## 2021-04-08 DIAGNOSIS — Z471 Aftercare following joint replacement surgery: Secondary | ICD-10-CM | POA: Diagnosis not present

## 2021-04-08 DIAGNOSIS — Z96642 Presence of left artificial hip joint: Secondary | ICD-10-CM | POA: Diagnosis not present

## 2021-04-08 DIAGNOSIS — N186 End stage renal disease: Secondary | ICD-10-CM | POA: Diagnosis not present

## 2021-04-08 DIAGNOSIS — I120 Hypertensive chronic kidney disease with stage 5 chronic kidney disease or end stage renal disease: Secondary | ICD-10-CM | POA: Diagnosis not present

## 2021-04-08 DIAGNOSIS — E781 Pure hyperglyceridemia: Secondary | ICD-10-CM | POA: Diagnosis not present

## 2021-04-08 DIAGNOSIS — M170 Bilateral primary osteoarthritis of knee: Secondary | ICD-10-CM | POA: Diagnosis not present

## 2021-04-08 DIAGNOSIS — Z992 Dependence on renal dialysis: Secondary | ICD-10-CM | POA: Diagnosis not present

## 2021-04-09 DIAGNOSIS — N2581 Secondary hyperparathyroidism of renal origin: Secondary | ICD-10-CM | POA: Diagnosis not present

## 2021-04-09 DIAGNOSIS — D689 Coagulation defect, unspecified: Secondary | ICD-10-CM | POA: Diagnosis not present

## 2021-04-09 DIAGNOSIS — D472 Monoclonal gammopathy: Secondary | ICD-10-CM | POA: Diagnosis not present

## 2021-04-09 DIAGNOSIS — N186 End stage renal disease: Secondary | ICD-10-CM | POA: Diagnosis not present

## 2021-04-09 DIAGNOSIS — D509 Iron deficiency anemia, unspecified: Secondary | ICD-10-CM | POA: Diagnosis not present

## 2021-04-09 DIAGNOSIS — Z992 Dependence on renal dialysis: Secondary | ICD-10-CM | POA: Diagnosis not present

## 2021-04-11 DIAGNOSIS — Z992 Dependence on renal dialysis: Secondary | ICD-10-CM | POA: Diagnosis not present

## 2021-04-11 DIAGNOSIS — D689 Coagulation defect, unspecified: Secondary | ICD-10-CM | POA: Diagnosis not present

## 2021-04-11 DIAGNOSIS — N186 End stage renal disease: Secondary | ICD-10-CM | POA: Diagnosis not present

## 2021-04-11 DIAGNOSIS — N2581 Secondary hyperparathyroidism of renal origin: Secondary | ICD-10-CM | POA: Diagnosis not present

## 2021-04-11 DIAGNOSIS — D472 Monoclonal gammopathy: Secondary | ICD-10-CM | POA: Diagnosis not present

## 2021-04-11 DIAGNOSIS — D509 Iron deficiency anemia, unspecified: Secondary | ICD-10-CM | POA: Diagnosis not present

## 2021-04-14 DIAGNOSIS — D472 Monoclonal gammopathy: Secondary | ICD-10-CM | POA: Diagnosis not present

## 2021-04-14 DIAGNOSIS — D689 Coagulation defect, unspecified: Secondary | ICD-10-CM | POA: Diagnosis not present

## 2021-04-14 DIAGNOSIS — N2581 Secondary hyperparathyroidism of renal origin: Secondary | ICD-10-CM | POA: Diagnosis not present

## 2021-04-14 DIAGNOSIS — Z992 Dependence on renal dialysis: Secondary | ICD-10-CM | POA: Diagnosis not present

## 2021-04-14 DIAGNOSIS — N186 End stage renal disease: Secondary | ICD-10-CM | POA: Diagnosis not present

## 2021-04-14 DIAGNOSIS — D509 Iron deficiency anemia, unspecified: Secondary | ICD-10-CM | POA: Diagnosis not present

## 2021-04-16 DIAGNOSIS — N186 End stage renal disease: Secondary | ICD-10-CM | POA: Diagnosis not present

## 2021-04-16 DIAGNOSIS — N2581 Secondary hyperparathyroidism of renal origin: Secondary | ICD-10-CM | POA: Diagnosis not present

## 2021-04-16 DIAGNOSIS — D472 Monoclonal gammopathy: Secondary | ICD-10-CM | POA: Diagnosis not present

## 2021-04-16 DIAGNOSIS — Z992 Dependence on renal dialysis: Secondary | ICD-10-CM | POA: Diagnosis not present

## 2021-04-16 DIAGNOSIS — D509 Iron deficiency anemia, unspecified: Secondary | ICD-10-CM | POA: Diagnosis not present

## 2021-04-16 DIAGNOSIS — D689 Coagulation defect, unspecified: Secondary | ICD-10-CM | POA: Diagnosis not present

## 2021-04-18 DIAGNOSIS — N186 End stage renal disease: Secondary | ICD-10-CM | POA: Diagnosis not present

## 2021-04-18 DIAGNOSIS — Z992 Dependence on renal dialysis: Secondary | ICD-10-CM | POA: Diagnosis not present

## 2021-04-18 DIAGNOSIS — D689 Coagulation defect, unspecified: Secondary | ICD-10-CM | POA: Diagnosis not present

## 2021-04-18 DIAGNOSIS — D509 Iron deficiency anemia, unspecified: Secondary | ICD-10-CM | POA: Diagnosis not present

## 2021-04-18 DIAGNOSIS — N2581 Secondary hyperparathyroidism of renal origin: Secondary | ICD-10-CM | POA: Diagnosis not present

## 2021-04-18 DIAGNOSIS — D472 Monoclonal gammopathy: Secondary | ICD-10-CM | POA: Diagnosis not present

## 2021-04-21 ENCOUNTER — Other Ambulatory Visit (HOSPITAL_BASED_OUTPATIENT_CLINIC_OR_DEPARTMENT_OTHER): Payer: Self-pay

## 2021-04-21 DIAGNOSIS — N186 End stage renal disease: Secondary | ICD-10-CM | POA: Diagnosis not present

## 2021-04-21 DIAGNOSIS — D472 Monoclonal gammopathy: Secondary | ICD-10-CM | POA: Diagnosis not present

## 2021-04-21 DIAGNOSIS — D509 Iron deficiency anemia, unspecified: Secondary | ICD-10-CM | POA: Diagnosis not present

## 2021-04-21 DIAGNOSIS — N2581 Secondary hyperparathyroidism of renal origin: Secondary | ICD-10-CM | POA: Diagnosis not present

## 2021-04-21 DIAGNOSIS — D689 Coagulation defect, unspecified: Secondary | ICD-10-CM | POA: Diagnosis not present

## 2021-04-21 DIAGNOSIS — Z992 Dependence on renal dialysis: Secondary | ICD-10-CM | POA: Diagnosis not present

## 2021-04-22 DIAGNOSIS — N186 End stage renal disease: Secondary | ICD-10-CM | POA: Diagnosis not present

## 2021-04-22 DIAGNOSIS — Z992 Dependence on renal dialysis: Secondary | ICD-10-CM | POA: Diagnosis not present

## 2021-04-22 DIAGNOSIS — Z452 Encounter for adjustment and management of vascular access device: Secondary | ICD-10-CM | POA: Diagnosis not present

## 2021-04-23 DIAGNOSIS — Z992 Dependence on renal dialysis: Secondary | ICD-10-CM | POA: Diagnosis not present

## 2021-04-23 DIAGNOSIS — D472 Monoclonal gammopathy: Secondary | ICD-10-CM | POA: Diagnosis not present

## 2021-04-23 DIAGNOSIS — D509 Iron deficiency anemia, unspecified: Secondary | ICD-10-CM | POA: Diagnosis not present

## 2021-04-23 DIAGNOSIS — D689 Coagulation defect, unspecified: Secondary | ICD-10-CM | POA: Diagnosis not present

## 2021-04-23 DIAGNOSIS — N186 End stage renal disease: Secondary | ICD-10-CM | POA: Diagnosis not present

## 2021-04-23 DIAGNOSIS — N2581 Secondary hyperparathyroidism of renal origin: Secondary | ICD-10-CM | POA: Diagnosis not present

## 2021-04-24 DIAGNOSIS — K219 Gastro-esophageal reflux disease without esophagitis: Secondary | ICD-10-CM | POA: Diagnosis not present

## 2021-04-24 DIAGNOSIS — Z471 Aftercare following joint replacement surgery: Secondary | ICD-10-CM | POA: Diagnosis not present

## 2021-04-24 DIAGNOSIS — D631 Anemia in chronic kidney disease: Secondary | ICD-10-CM | POA: Diagnosis not present

## 2021-04-24 DIAGNOSIS — I120 Hypertensive chronic kidney disease with stage 5 chronic kidney disease or end stage renal disease: Secondary | ICD-10-CM | POA: Diagnosis not present

## 2021-04-24 DIAGNOSIS — Z96642 Presence of left artificial hip joint: Secondary | ICD-10-CM | POA: Diagnosis not present

## 2021-04-24 DIAGNOSIS — N186 End stage renal disease: Secondary | ICD-10-CM | POA: Diagnosis not present

## 2021-04-24 DIAGNOSIS — M170 Bilateral primary osteoarthritis of knee: Secondary | ICD-10-CM | POA: Diagnosis not present

## 2021-04-24 DIAGNOSIS — H9190 Unspecified hearing loss, unspecified ear: Secondary | ICD-10-CM | POA: Diagnosis not present

## 2021-04-24 DIAGNOSIS — Z9181 History of falling: Secondary | ICD-10-CM | POA: Diagnosis not present

## 2021-04-24 DIAGNOSIS — Z7982 Long term (current) use of aspirin: Secondary | ICD-10-CM | POA: Diagnosis not present

## 2021-04-24 DIAGNOSIS — Z992 Dependence on renal dialysis: Secondary | ICD-10-CM | POA: Diagnosis not present

## 2021-04-24 DIAGNOSIS — E781 Pure hyperglyceridemia: Secondary | ICD-10-CM | POA: Diagnosis not present

## 2021-04-25 DIAGNOSIS — Z992 Dependence on renal dialysis: Secondary | ICD-10-CM | POA: Diagnosis not present

## 2021-04-25 DIAGNOSIS — N186 End stage renal disease: Secondary | ICD-10-CM | POA: Diagnosis not present

## 2021-04-25 DIAGNOSIS — D472 Monoclonal gammopathy: Secondary | ICD-10-CM | POA: Diagnosis not present

## 2021-04-25 DIAGNOSIS — N2581 Secondary hyperparathyroidism of renal origin: Secondary | ICD-10-CM | POA: Diagnosis not present

## 2021-04-25 DIAGNOSIS — I129 Hypertensive chronic kidney disease with stage 1 through stage 4 chronic kidney disease, or unspecified chronic kidney disease: Secondary | ICD-10-CM | POA: Diagnosis not present

## 2021-04-25 DIAGNOSIS — D509 Iron deficiency anemia, unspecified: Secondary | ICD-10-CM | POA: Diagnosis not present

## 2021-04-25 DIAGNOSIS — D689 Coagulation defect, unspecified: Secondary | ICD-10-CM | POA: Diagnosis not present

## 2021-04-28 DIAGNOSIS — Z992 Dependence on renal dialysis: Secondary | ICD-10-CM | POA: Diagnosis not present

## 2021-04-28 DIAGNOSIS — D689 Coagulation defect, unspecified: Secondary | ICD-10-CM | POA: Diagnosis not present

## 2021-04-28 DIAGNOSIS — N186 End stage renal disease: Secondary | ICD-10-CM | POA: Diagnosis not present

## 2021-04-28 DIAGNOSIS — N2581 Secondary hyperparathyroidism of renal origin: Secondary | ICD-10-CM | POA: Diagnosis not present

## 2021-04-28 DIAGNOSIS — D472 Monoclonal gammopathy: Secondary | ICD-10-CM | POA: Diagnosis not present

## 2021-04-28 DIAGNOSIS — D509 Iron deficiency anemia, unspecified: Secondary | ICD-10-CM | POA: Diagnosis not present

## 2021-04-30 DIAGNOSIS — D689 Coagulation defect, unspecified: Secondary | ICD-10-CM | POA: Diagnosis not present

## 2021-04-30 DIAGNOSIS — D472 Monoclonal gammopathy: Secondary | ICD-10-CM | POA: Diagnosis not present

## 2021-04-30 DIAGNOSIS — N2581 Secondary hyperparathyroidism of renal origin: Secondary | ICD-10-CM | POA: Diagnosis not present

## 2021-04-30 DIAGNOSIS — N186 End stage renal disease: Secondary | ICD-10-CM | POA: Diagnosis not present

## 2021-04-30 DIAGNOSIS — D509 Iron deficiency anemia, unspecified: Secondary | ICD-10-CM | POA: Diagnosis not present

## 2021-04-30 DIAGNOSIS — Z992 Dependence on renal dialysis: Secondary | ICD-10-CM | POA: Diagnosis not present

## 2021-05-01 DIAGNOSIS — Z96642 Presence of left artificial hip joint: Secondary | ICD-10-CM | POA: Diagnosis not present

## 2021-05-01 DIAGNOSIS — Z992 Dependence on renal dialysis: Secondary | ICD-10-CM | POA: Diagnosis not present

## 2021-05-01 DIAGNOSIS — E781 Pure hyperglyceridemia: Secondary | ICD-10-CM | POA: Diagnosis not present

## 2021-05-01 DIAGNOSIS — Z471 Aftercare following joint replacement surgery: Secondary | ICD-10-CM | POA: Diagnosis not present

## 2021-05-01 DIAGNOSIS — H9190 Unspecified hearing loss, unspecified ear: Secondary | ICD-10-CM | POA: Diagnosis not present

## 2021-05-01 DIAGNOSIS — D631 Anemia in chronic kidney disease: Secondary | ICD-10-CM | POA: Diagnosis not present

## 2021-05-01 DIAGNOSIS — K219 Gastro-esophageal reflux disease without esophagitis: Secondary | ICD-10-CM | POA: Diagnosis not present

## 2021-05-01 DIAGNOSIS — Z9181 History of falling: Secondary | ICD-10-CM | POA: Diagnosis not present

## 2021-05-01 DIAGNOSIS — I120 Hypertensive chronic kidney disease with stage 5 chronic kidney disease or end stage renal disease: Secondary | ICD-10-CM | POA: Diagnosis not present

## 2021-05-01 DIAGNOSIS — N186 End stage renal disease: Secondary | ICD-10-CM | POA: Diagnosis not present

## 2021-05-01 DIAGNOSIS — M170 Bilateral primary osteoarthritis of knee: Secondary | ICD-10-CM | POA: Diagnosis not present

## 2021-05-01 DIAGNOSIS — Z7982 Long term (current) use of aspirin: Secondary | ICD-10-CM | POA: Diagnosis not present

## 2021-05-02 DIAGNOSIS — Z992 Dependence on renal dialysis: Secondary | ICD-10-CM | POA: Diagnosis not present

## 2021-05-02 DIAGNOSIS — N2581 Secondary hyperparathyroidism of renal origin: Secondary | ICD-10-CM | POA: Diagnosis not present

## 2021-05-02 DIAGNOSIS — D472 Monoclonal gammopathy: Secondary | ICD-10-CM | POA: Diagnosis not present

## 2021-05-02 DIAGNOSIS — N186 End stage renal disease: Secondary | ICD-10-CM | POA: Diagnosis not present

## 2021-05-02 DIAGNOSIS — D689 Coagulation defect, unspecified: Secondary | ICD-10-CM | POA: Diagnosis not present

## 2021-05-02 DIAGNOSIS — D509 Iron deficiency anemia, unspecified: Secondary | ICD-10-CM | POA: Diagnosis not present

## 2021-05-05 DIAGNOSIS — H9190 Unspecified hearing loss, unspecified ear: Secondary | ICD-10-CM | POA: Diagnosis not present

## 2021-05-05 DIAGNOSIS — I120 Hypertensive chronic kidney disease with stage 5 chronic kidney disease or end stage renal disease: Secondary | ICD-10-CM | POA: Diagnosis not present

## 2021-05-05 DIAGNOSIS — E781 Pure hyperglyceridemia: Secondary | ICD-10-CM | POA: Diagnosis not present

## 2021-05-05 DIAGNOSIS — N186 End stage renal disease: Secondary | ICD-10-CM | POA: Diagnosis not present

## 2021-05-05 DIAGNOSIS — D509 Iron deficiency anemia, unspecified: Secondary | ICD-10-CM | POA: Diagnosis not present

## 2021-05-05 DIAGNOSIS — D631 Anemia in chronic kidney disease: Secondary | ICD-10-CM | POA: Diagnosis not present

## 2021-05-05 DIAGNOSIS — Z471 Aftercare following joint replacement surgery: Secondary | ICD-10-CM | POA: Diagnosis not present

## 2021-05-05 DIAGNOSIS — N2581 Secondary hyperparathyroidism of renal origin: Secondary | ICD-10-CM | POA: Diagnosis not present

## 2021-05-05 DIAGNOSIS — M170 Bilateral primary osteoarthritis of knee: Secondary | ICD-10-CM | POA: Diagnosis not present

## 2021-05-05 DIAGNOSIS — D472 Monoclonal gammopathy: Secondary | ICD-10-CM | POA: Diagnosis not present

## 2021-05-05 DIAGNOSIS — Z9181 History of falling: Secondary | ICD-10-CM | POA: Diagnosis not present

## 2021-05-05 DIAGNOSIS — Z96642 Presence of left artificial hip joint: Secondary | ICD-10-CM | POA: Diagnosis not present

## 2021-05-05 DIAGNOSIS — Z7982 Long term (current) use of aspirin: Secondary | ICD-10-CM | POA: Diagnosis not present

## 2021-05-05 DIAGNOSIS — Z992 Dependence on renal dialysis: Secondary | ICD-10-CM | POA: Diagnosis not present

## 2021-05-05 DIAGNOSIS — K219 Gastro-esophageal reflux disease without esophagitis: Secondary | ICD-10-CM | POA: Diagnosis not present

## 2021-05-05 DIAGNOSIS — D689 Coagulation defect, unspecified: Secondary | ICD-10-CM | POA: Diagnosis not present

## 2021-05-06 DIAGNOSIS — E781 Pure hyperglyceridemia: Secondary | ICD-10-CM | POA: Diagnosis not present

## 2021-05-06 DIAGNOSIS — H9190 Unspecified hearing loss, unspecified ear: Secondary | ICD-10-CM | POA: Diagnosis not present

## 2021-05-06 DIAGNOSIS — M170 Bilateral primary osteoarthritis of knee: Secondary | ICD-10-CM | POA: Diagnosis not present

## 2021-05-06 DIAGNOSIS — Z7982 Long term (current) use of aspirin: Secondary | ICD-10-CM | POA: Diagnosis not present

## 2021-05-06 DIAGNOSIS — Z96642 Presence of left artificial hip joint: Secondary | ICD-10-CM | POA: Diagnosis not present

## 2021-05-06 DIAGNOSIS — K219 Gastro-esophageal reflux disease without esophagitis: Secondary | ICD-10-CM | POA: Diagnosis not present

## 2021-05-06 DIAGNOSIS — D631 Anemia in chronic kidney disease: Secondary | ICD-10-CM | POA: Diagnosis not present

## 2021-05-06 DIAGNOSIS — I120 Hypertensive chronic kidney disease with stage 5 chronic kidney disease or end stage renal disease: Secondary | ICD-10-CM | POA: Diagnosis not present

## 2021-05-06 DIAGNOSIS — Z471 Aftercare following joint replacement surgery: Secondary | ICD-10-CM | POA: Diagnosis not present

## 2021-05-06 DIAGNOSIS — Z9181 History of falling: Secondary | ICD-10-CM | POA: Diagnosis not present

## 2021-05-06 DIAGNOSIS — N186 End stage renal disease: Secondary | ICD-10-CM | POA: Diagnosis not present

## 2021-05-06 DIAGNOSIS — Z992 Dependence on renal dialysis: Secondary | ICD-10-CM | POA: Diagnosis not present

## 2021-05-07 DIAGNOSIS — Z992 Dependence on renal dialysis: Secondary | ICD-10-CM | POA: Diagnosis not present

## 2021-05-07 DIAGNOSIS — N2581 Secondary hyperparathyroidism of renal origin: Secondary | ICD-10-CM | POA: Diagnosis not present

## 2021-05-07 DIAGNOSIS — N186 End stage renal disease: Secondary | ICD-10-CM | POA: Diagnosis not present

## 2021-05-07 DIAGNOSIS — D689 Coagulation defect, unspecified: Secondary | ICD-10-CM | POA: Diagnosis not present

## 2021-05-07 DIAGNOSIS — D509 Iron deficiency anemia, unspecified: Secondary | ICD-10-CM | POA: Diagnosis not present

## 2021-05-07 DIAGNOSIS — D472 Monoclonal gammopathy: Secondary | ICD-10-CM | POA: Diagnosis not present

## 2021-05-08 ENCOUNTER — Other Ambulatory Visit (HOSPITAL_BASED_OUTPATIENT_CLINIC_OR_DEPARTMENT_OTHER): Payer: Self-pay

## 2021-05-09 DIAGNOSIS — D689 Coagulation defect, unspecified: Secondary | ICD-10-CM | POA: Diagnosis not present

## 2021-05-09 DIAGNOSIS — N186 End stage renal disease: Secondary | ICD-10-CM | POA: Diagnosis not present

## 2021-05-09 DIAGNOSIS — N2581 Secondary hyperparathyroidism of renal origin: Secondary | ICD-10-CM | POA: Diagnosis not present

## 2021-05-09 DIAGNOSIS — D509 Iron deficiency anemia, unspecified: Secondary | ICD-10-CM | POA: Diagnosis not present

## 2021-05-09 DIAGNOSIS — D472 Monoclonal gammopathy: Secondary | ICD-10-CM | POA: Diagnosis not present

## 2021-05-09 DIAGNOSIS — Z992 Dependence on renal dialysis: Secondary | ICD-10-CM | POA: Diagnosis not present

## 2021-05-11 ENCOUNTER — Other Ambulatory Visit (HOSPITAL_BASED_OUTPATIENT_CLINIC_OR_DEPARTMENT_OTHER): Payer: Self-pay

## 2021-05-12 DIAGNOSIS — N2581 Secondary hyperparathyroidism of renal origin: Secondary | ICD-10-CM | POA: Diagnosis not present

## 2021-05-12 DIAGNOSIS — D509 Iron deficiency anemia, unspecified: Secondary | ICD-10-CM | POA: Diagnosis not present

## 2021-05-12 DIAGNOSIS — Z992 Dependence on renal dialysis: Secondary | ICD-10-CM | POA: Diagnosis not present

## 2021-05-12 DIAGNOSIS — D689 Coagulation defect, unspecified: Secondary | ICD-10-CM | POA: Diagnosis not present

## 2021-05-12 DIAGNOSIS — D472 Monoclonal gammopathy: Secondary | ICD-10-CM | POA: Diagnosis not present

## 2021-05-12 DIAGNOSIS — N186 End stage renal disease: Secondary | ICD-10-CM | POA: Diagnosis not present

## 2021-05-13 DIAGNOSIS — E781 Pure hyperglyceridemia: Secondary | ICD-10-CM | POA: Diagnosis not present

## 2021-05-13 DIAGNOSIS — Z9181 History of falling: Secondary | ICD-10-CM | POA: Diagnosis not present

## 2021-05-13 DIAGNOSIS — D631 Anemia in chronic kidney disease: Secondary | ICD-10-CM | POA: Diagnosis not present

## 2021-05-13 DIAGNOSIS — H9190 Unspecified hearing loss, unspecified ear: Secondary | ICD-10-CM | POA: Diagnosis not present

## 2021-05-13 DIAGNOSIS — M170 Bilateral primary osteoarthritis of knee: Secondary | ICD-10-CM | POA: Diagnosis not present

## 2021-05-13 DIAGNOSIS — Z7982 Long term (current) use of aspirin: Secondary | ICD-10-CM | POA: Diagnosis not present

## 2021-05-13 DIAGNOSIS — I120 Hypertensive chronic kidney disease with stage 5 chronic kidney disease or end stage renal disease: Secondary | ICD-10-CM | POA: Diagnosis not present

## 2021-05-13 DIAGNOSIS — Z471 Aftercare following joint replacement surgery: Secondary | ICD-10-CM | POA: Diagnosis not present

## 2021-05-13 DIAGNOSIS — K219 Gastro-esophageal reflux disease without esophagitis: Secondary | ICD-10-CM | POA: Diagnosis not present

## 2021-05-13 DIAGNOSIS — Z96642 Presence of left artificial hip joint: Secondary | ICD-10-CM | POA: Diagnosis not present

## 2021-05-13 DIAGNOSIS — N186 End stage renal disease: Secondary | ICD-10-CM | POA: Diagnosis not present

## 2021-05-13 DIAGNOSIS — Z992 Dependence on renal dialysis: Secondary | ICD-10-CM | POA: Diagnosis not present

## 2021-05-14 DIAGNOSIS — Z992 Dependence on renal dialysis: Secondary | ICD-10-CM | POA: Diagnosis not present

## 2021-05-14 DIAGNOSIS — N2581 Secondary hyperparathyroidism of renal origin: Secondary | ICD-10-CM | POA: Diagnosis not present

## 2021-05-14 DIAGNOSIS — D509 Iron deficiency anemia, unspecified: Secondary | ICD-10-CM | POA: Diagnosis not present

## 2021-05-14 DIAGNOSIS — D689 Coagulation defect, unspecified: Secondary | ICD-10-CM | POA: Diagnosis not present

## 2021-05-14 DIAGNOSIS — N186 End stage renal disease: Secondary | ICD-10-CM | POA: Diagnosis not present

## 2021-05-14 DIAGNOSIS — D472 Monoclonal gammopathy: Secondary | ICD-10-CM | POA: Diagnosis not present

## 2021-05-16 DIAGNOSIS — N186 End stage renal disease: Secondary | ICD-10-CM | POA: Diagnosis not present

## 2021-05-16 DIAGNOSIS — N2581 Secondary hyperparathyroidism of renal origin: Secondary | ICD-10-CM | POA: Diagnosis not present

## 2021-05-16 DIAGNOSIS — Z992 Dependence on renal dialysis: Secondary | ICD-10-CM | POA: Diagnosis not present

## 2021-05-16 DIAGNOSIS — D689 Coagulation defect, unspecified: Secondary | ICD-10-CM | POA: Diagnosis not present

## 2021-05-16 DIAGNOSIS — D472 Monoclonal gammopathy: Secondary | ICD-10-CM | POA: Diagnosis not present

## 2021-05-16 DIAGNOSIS — D509 Iron deficiency anemia, unspecified: Secondary | ICD-10-CM | POA: Diagnosis not present

## 2021-05-19 DIAGNOSIS — D509 Iron deficiency anemia, unspecified: Secondary | ICD-10-CM | POA: Diagnosis not present

## 2021-05-19 DIAGNOSIS — N2581 Secondary hyperparathyroidism of renal origin: Secondary | ICD-10-CM | POA: Diagnosis not present

## 2021-05-19 DIAGNOSIS — D689 Coagulation defect, unspecified: Secondary | ICD-10-CM | POA: Diagnosis not present

## 2021-05-19 DIAGNOSIS — D472 Monoclonal gammopathy: Secondary | ICD-10-CM | POA: Diagnosis not present

## 2021-05-19 DIAGNOSIS — N186 End stage renal disease: Secondary | ICD-10-CM | POA: Diagnosis not present

## 2021-05-19 DIAGNOSIS — Z992 Dependence on renal dialysis: Secondary | ICD-10-CM | POA: Diagnosis not present

## 2021-05-20 DIAGNOSIS — E781 Pure hyperglyceridemia: Secondary | ICD-10-CM | POA: Diagnosis not present

## 2021-05-20 DIAGNOSIS — I120 Hypertensive chronic kidney disease with stage 5 chronic kidney disease or end stage renal disease: Secondary | ICD-10-CM | POA: Diagnosis not present

## 2021-05-20 DIAGNOSIS — H9190 Unspecified hearing loss, unspecified ear: Secondary | ICD-10-CM | POA: Diagnosis not present

## 2021-05-20 DIAGNOSIS — K219 Gastro-esophageal reflux disease without esophagitis: Secondary | ICD-10-CM | POA: Diagnosis not present

## 2021-05-20 DIAGNOSIS — Z471 Aftercare following joint replacement surgery: Secondary | ICD-10-CM | POA: Diagnosis not present

## 2021-05-20 DIAGNOSIS — Z9181 History of falling: Secondary | ICD-10-CM | POA: Diagnosis not present

## 2021-05-20 DIAGNOSIS — D631 Anemia in chronic kidney disease: Secondary | ICD-10-CM | POA: Diagnosis not present

## 2021-05-20 DIAGNOSIS — Z7982 Long term (current) use of aspirin: Secondary | ICD-10-CM | POA: Diagnosis not present

## 2021-05-20 DIAGNOSIS — Z992 Dependence on renal dialysis: Secondary | ICD-10-CM | POA: Diagnosis not present

## 2021-05-20 DIAGNOSIS — Z96642 Presence of left artificial hip joint: Secondary | ICD-10-CM | POA: Diagnosis not present

## 2021-05-20 DIAGNOSIS — N186 End stage renal disease: Secondary | ICD-10-CM | POA: Diagnosis not present

## 2021-05-20 DIAGNOSIS — M170 Bilateral primary osteoarthritis of knee: Secondary | ICD-10-CM | POA: Diagnosis not present

## 2021-05-21 DIAGNOSIS — N186 End stage renal disease: Secondary | ICD-10-CM | POA: Diagnosis not present

## 2021-05-21 DIAGNOSIS — D689 Coagulation defect, unspecified: Secondary | ICD-10-CM | POA: Diagnosis not present

## 2021-05-21 DIAGNOSIS — D472 Monoclonal gammopathy: Secondary | ICD-10-CM | POA: Diagnosis not present

## 2021-05-21 DIAGNOSIS — Z992 Dependence on renal dialysis: Secondary | ICD-10-CM | POA: Diagnosis not present

## 2021-05-21 DIAGNOSIS — N2581 Secondary hyperparathyroidism of renal origin: Secondary | ICD-10-CM | POA: Diagnosis not present

## 2021-05-21 DIAGNOSIS — D509 Iron deficiency anemia, unspecified: Secondary | ICD-10-CM | POA: Diagnosis not present

## 2021-05-23 DIAGNOSIS — Z992 Dependence on renal dialysis: Secondary | ICD-10-CM | POA: Diagnosis not present

## 2021-05-23 DIAGNOSIS — N186 End stage renal disease: Secondary | ICD-10-CM | POA: Diagnosis not present

## 2021-05-23 DIAGNOSIS — D472 Monoclonal gammopathy: Secondary | ICD-10-CM | POA: Diagnosis not present

## 2021-05-23 DIAGNOSIS — N2581 Secondary hyperparathyroidism of renal origin: Secondary | ICD-10-CM | POA: Diagnosis not present

## 2021-05-23 DIAGNOSIS — D509 Iron deficiency anemia, unspecified: Secondary | ICD-10-CM | POA: Diagnosis not present

## 2021-05-23 DIAGNOSIS — D689 Coagulation defect, unspecified: Secondary | ICD-10-CM | POA: Diagnosis not present

## 2021-05-25 DIAGNOSIS — H9190 Unspecified hearing loss, unspecified ear: Secondary | ICD-10-CM | POA: Diagnosis not present

## 2021-05-25 DIAGNOSIS — E781 Pure hyperglyceridemia: Secondary | ICD-10-CM | POA: Diagnosis not present

## 2021-05-25 DIAGNOSIS — K219 Gastro-esophageal reflux disease without esophagitis: Secondary | ICD-10-CM | POA: Diagnosis not present

## 2021-05-25 DIAGNOSIS — I120 Hypertensive chronic kidney disease with stage 5 chronic kidney disease or end stage renal disease: Secondary | ICD-10-CM | POA: Diagnosis not present

## 2021-05-25 DIAGNOSIS — M170 Bilateral primary osteoarthritis of knee: Secondary | ICD-10-CM | POA: Diagnosis not present

## 2021-05-25 DIAGNOSIS — Z9181 History of falling: Secondary | ICD-10-CM | POA: Diagnosis not present

## 2021-05-25 DIAGNOSIS — Z471 Aftercare following joint replacement surgery: Secondary | ICD-10-CM | POA: Diagnosis not present

## 2021-05-25 DIAGNOSIS — N186 End stage renal disease: Secondary | ICD-10-CM | POA: Diagnosis not present

## 2021-05-25 DIAGNOSIS — Z7982 Long term (current) use of aspirin: Secondary | ICD-10-CM | POA: Diagnosis not present

## 2021-05-25 DIAGNOSIS — D631 Anemia in chronic kidney disease: Secondary | ICD-10-CM | POA: Diagnosis not present

## 2021-05-25 DIAGNOSIS — Z992 Dependence on renal dialysis: Secondary | ICD-10-CM | POA: Diagnosis not present

## 2021-05-25 DIAGNOSIS — Z96642 Presence of left artificial hip joint: Secondary | ICD-10-CM | POA: Diagnosis not present

## 2021-05-26 DIAGNOSIS — Z992 Dependence on renal dialysis: Secondary | ICD-10-CM | POA: Diagnosis not present

## 2021-05-26 DIAGNOSIS — I129 Hypertensive chronic kidney disease with stage 1 through stage 4 chronic kidney disease, or unspecified chronic kidney disease: Secondary | ICD-10-CM | POA: Diagnosis not present

## 2021-05-26 DIAGNOSIS — N186 End stage renal disease: Secondary | ICD-10-CM | POA: Diagnosis not present

## 2021-05-26 DIAGNOSIS — D472 Monoclonal gammopathy: Secondary | ICD-10-CM | POA: Diagnosis not present

## 2021-05-26 DIAGNOSIS — D689 Coagulation defect, unspecified: Secondary | ICD-10-CM | POA: Diagnosis not present

## 2021-05-26 DIAGNOSIS — N2581 Secondary hyperparathyroidism of renal origin: Secondary | ICD-10-CM | POA: Diagnosis not present

## 2021-05-26 DIAGNOSIS — D509 Iron deficiency anemia, unspecified: Secondary | ICD-10-CM | POA: Diagnosis not present

## 2021-05-28 DIAGNOSIS — D472 Monoclonal gammopathy: Secondary | ICD-10-CM | POA: Diagnosis not present

## 2021-05-28 DIAGNOSIS — Z992 Dependence on renal dialysis: Secondary | ICD-10-CM | POA: Diagnosis not present

## 2021-05-28 DIAGNOSIS — D509 Iron deficiency anemia, unspecified: Secondary | ICD-10-CM | POA: Diagnosis not present

## 2021-05-28 DIAGNOSIS — N2581 Secondary hyperparathyroidism of renal origin: Secondary | ICD-10-CM | POA: Diagnosis not present

## 2021-05-28 DIAGNOSIS — D689 Coagulation defect, unspecified: Secondary | ICD-10-CM | POA: Diagnosis not present

## 2021-05-28 DIAGNOSIS — N186 End stage renal disease: Secondary | ICD-10-CM | POA: Diagnosis not present

## 2021-05-30 DIAGNOSIS — D689 Coagulation defect, unspecified: Secondary | ICD-10-CM | POA: Diagnosis not present

## 2021-05-30 DIAGNOSIS — Z992 Dependence on renal dialysis: Secondary | ICD-10-CM | POA: Diagnosis not present

## 2021-05-30 DIAGNOSIS — D472 Monoclonal gammopathy: Secondary | ICD-10-CM | POA: Diagnosis not present

## 2021-05-30 DIAGNOSIS — D509 Iron deficiency anemia, unspecified: Secondary | ICD-10-CM | POA: Diagnosis not present

## 2021-05-30 DIAGNOSIS — N2581 Secondary hyperparathyroidism of renal origin: Secondary | ICD-10-CM | POA: Diagnosis not present

## 2021-05-30 DIAGNOSIS — N186 End stage renal disease: Secondary | ICD-10-CM | POA: Diagnosis not present

## 2021-06-02 DIAGNOSIS — Z992 Dependence on renal dialysis: Secondary | ICD-10-CM | POA: Diagnosis not present

## 2021-06-02 DIAGNOSIS — D689 Coagulation defect, unspecified: Secondary | ICD-10-CM | POA: Diagnosis not present

## 2021-06-02 DIAGNOSIS — N186 End stage renal disease: Secondary | ICD-10-CM | POA: Diagnosis not present

## 2021-06-02 DIAGNOSIS — N2581 Secondary hyperparathyroidism of renal origin: Secondary | ICD-10-CM | POA: Diagnosis not present

## 2021-06-02 DIAGNOSIS — D509 Iron deficiency anemia, unspecified: Secondary | ICD-10-CM | POA: Diagnosis not present

## 2021-06-02 DIAGNOSIS — D472 Monoclonal gammopathy: Secondary | ICD-10-CM | POA: Diagnosis not present

## 2021-06-04 DIAGNOSIS — Z992 Dependence on renal dialysis: Secondary | ICD-10-CM | POA: Diagnosis not present

## 2021-06-04 DIAGNOSIS — N186 End stage renal disease: Secondary | ICD-10-CM | POA: Diagnosis not present

## 2021-06-04 DIAGNOSIS — D472 Monoclonal gammopathy: Secondary | ICD-10-CM | POA: Diagnosis not present

## 2021-06-04 DIAGNOSIS — D509 Iron deficiency anemia, unspecified: Secondary | ICD-10-CM | POA: Diagnosis not present

## 2021-06-04 DIAGNOSIS — D689 Coagulation defect, unspecified: Secondary | ICD-10-CM | POA: Diagnosis not present

## 2021-06-04 DIAGNOSIS — N2581 Secondary hyperparathyroidism of renal origin: Secondary | ICD-10-CM | POA: Diagnosis not present

## 2021-06-06 DIAGNOSIS — D689 Coagulation defect, unspecified: Secondary | ICD-10-CM | POA: Diagnosis not present

## 2021-06-06 DIAGNOSIS — N186 End stage renal disease: Secondary | ICD-10-CM | POA: Diagnosis not present

## 2021-06-06 DIAGNOSIS — Z992 Dependence on renal dialysis: Secondary | ICD-10-CM | POA: Diagnosis not present

## 2021-06-06 DIAGNOSIS — D509 Iron deficiency anemia, unspecified: Secondary | ICD-10-CM | POA: Diagnosis not present

## 2021-06-06 DIAGNOSIS — N2581 Secondary hyperparathyroidism of renal origin: Secondary | ICD-10-CM | POA: Diagnosis not present

## 2021-06-06 DIAGNOSIS — D472 Monoclonal gammopathy: Secondary | ICD-10-CM | POA: Diagnosis not present

## 2021-06-09 DIAGNOSIS — D472 Monoclonal gammopathy: Secondary | ICD-10-CM | POA: Diagnosis not present

## 2021-06-09 DIAGNOSIS — D689 Coagulation defect, unspecified: Secondary | ICD-10-CM | POA: Diagnosis not present

## 2021-06-09 DIAGNOSIS — Z992 Dependence on renal dialysis: Secondary | ICD-10-CM | POA: Diagnosis not present

## 2021-06-09 DIAGNOSIS — N2581 Secondary hyperparathyroidism of renal origin: Secondary | ICD-10-CM | POA: Diagnosis not present

## 2021-06-09 DIAGNOSIS — D509 Iron deficiency anemia, unspecified: Secondary | ICD-10-CM | POA: Diagnosis not present

## 2021-06-09 DIAGNOSIS — N186 End stage renal disease: Secondary | ICD-10-CM | POA: Diagnosis not present

## 2021-06-11 DIAGNOSIS — D509 Iron deficiency anemia, unspecified: Secondary | ICD-10-CM | POA: Diagnosis not present

## 2021-06-11 DIAGNOSIS — N2581 Secondary hyperparathyroidism of renal origin: Secondary | ICD-10-CM | POA: Diagnosis not present

## 2021-06-11 DIAGNOSIS — N186 End stage renal disease: Secondary | ICD-10-CM | POA: Diagnosis not present

## 2021-06-11 DIAGNOSIS — D472 Monoclonal gammopathy: Secondary | ICD-10-CM | POA: Diagnosis not present

## 2021-06-11 DIAGNOSIS — D689 Coagulation defect, unspecified: Secondary | ICD-10-CM | POA: Diagnosis not present

## 2021-06-11 DIAGNOSIS — Z992 Dependence on renal dialysis: Secondary | ICD-10-CM | POA: Diagnosis not present

## 2021-06-13 DIAGNOSIS — D689 Coagulation defect, unspecified: Secondary | ICD-10-CM | POA: Diagnosis not present

## 2021-06-13 DIAGNOSIS — Z992 Dependence on renal dialysis: Secondary | ICD-10-CM | POA: Diagnosis not present

## 2021-06-13 DIAGNOSIS — D509 Iron deficiency anemia, unspecified: Secondary | ICD-10-CM | POA: Diagnosis not present

## 2021-06-13 DIAGNOSIS — N2581 Secondary hyperparathyroidism of renal origin: Secondary | ICD-10-CM | POA: Diagnosis not present

## 2021-06-13 DIAGNOSIS — N186 End stage renal disease: Secondary | ICD-10-CM | POA: Diagnosis not present

## 2021-06-13 DIAGNOSIS — D472 Monoclonal gammopathy: Secondary | ICD-10-CM | POA: Diagnosis not present

## 2021-06-16 DIAGNOSIS — D509 Iron deficiency anemia, unspecified: Secondary | ICD-10-CM | POA: Diagnosis not present

## 2021-06-16 DIAGNOSIS — D472 Monoclonal gammopathy: Secondary | ICD-10-CM | POA: Diagnosis not present

## 2021-06-16 DIAGNOSIS — D689 Coagulation defect, unspecified: Secondary | ICD-10-CM | POA: Diagnosis not present

## 2021-06-16 DIAGNOSIS — N2581 Secondary hyperparathyroidism of renal origin: Secondary | ICD-10-CM | POA: Diagnosis not present

## 2021-06-16 DIAGNOSIS — N186 End stage renal disease: Secondary | ICD-10-CM | POA: Diagnosis not present

## 2021-06-16 DIAGNOSIS — Z992 Dependence on renal dialysis: Secondary | ICD-10-CM | POA: Diagnosis not present

## 2021-06-18 DIAGNOSIS — N186 End stage renal disease: Secondary | ICD-10-CM | POA: Diagnosis not present

## 2021-06-18 DIAGNOSIS — D472 Monoclonal gammopathy: Secondary | ICD-10-CM | POA: Diagnosis not present

## 2021-06-18 DIAGNOSIS — D509 Iron deficiency anemia, unspecified: Secondary | ICD-10-CM | POA: Diagnosis not present

## 2021-06-18 DIAGNOSIS — D689 Coagulation defect, unspecified: Secondary | ICD-10-CM | POA: Diagnosis not present

## 2021-06-18 DIAGNOSIS — N2581 Secondary hyperparathyroidism of renal origin: Secondary | ICD-10-CM | POA: Diagnosis not present

## 2021-06-18 DIAGNOSIS — Z992 Dependence on renal dialysis: Secondary | ICD-10-CM | POA: Diagnosis not present

## 2021-06-19 DIAGNOSIS — T82858A Stenosis of vascular prosthetic devices, implants and grafts, initial encounter: Secondary | ICD-10-CM | POA: Diagnosis not present

## 2021-06-19 DIAGNOSIS — I771 Stricture of artery: Secondary | ICD-10-CM | POA: Diagnosis not present

## 2021-06-19 DIAGNOSIS — N186 End stage renal disease: Secondary | ICD-10-CM | POA: Diagnosis not present

## 2021-06-19 DIAGNOSIS — Z992 Dependence on renal dialysis: Secondary | ICD-10-CM | POA: Diagnosis not present

## 2021-06-20 DIAGNOSIS — N186 End stage renal disease: Secondary | ICD-10-CM | POA: Diagnosis not present

## 2021-06-20 DIAGNOSIS — N2581 Secondary hyperparathyroidism of renal origin: Secondary | ICD-10-CM | POA: Diagnosis not present

## 2021-06-20 DIAGNOSIS — D689 Coagulation defect, unspecified: Secondary | ICD-10-CM | POA: Diagnosis not present

## 2021-06-20 DIAGNOSIS — D509 Iron deficiency anemia, unspecified: Secondary | ICD-10-CM | POA: Diagnosis not present

## 2021-06-20 DIAGNOSIS — Z992 Dependence on renal dialysis: Secondary | ICD-10-CM | POA: Diagnosis not present

## 2021-06-20 DIAGNOSIS — D472 Monoclonal gammopathy: Secondary | ICD-10-CM | POA: Diagnosis not present

## 2021-06-23 DIAGNOSIS — N2581 Secondary hyperparathyroidism of renal origin: Secondary | ICD-10-CM | POA: Diagnosis not present

## 2021-06-23 DIAGNOSIS — Z992 Dependence on renal dialysis: Secondary | ICD-10-CM | POA: Diagnosis not present

## 2021-06-23 DIAGNOSIS — D689 Coagulation defect, unspecified: Secondary | ICD-10-CM | POA: Diagnosis not present

## 2021-06-23 DIAGNOSIS — N186 End stage renal disease: Secondary | ICD-10-CM | POA: Diagnosis not present

## 2021-06-23 DIAGNOSIS — D472 Monoclonal gammopathy: Secondary | ICD-10-CM | POA: Diagnosis not present

## 2021-06-23 DIAGNOSIS — D509 Iron deficiency anemia, unspecified: Secondary | ICD-10-CM | POA: Diagnosis not present

## 2021-06-23 DIAGNOSIS — I129 Hypertensive chronic kidney disease with stage 1 through stage 4 chronic kidney disease, or unspecified chronic kidney disease: Secondary | ICD-10-CM | POA: Diagnosis not present

## 2021-06-25 DIAGNOSIS — D689 Coagulation defect, unspecified: Secondary | ICD-10-CM | POA: Diagnosis not present

## 2021-06-25 DIAGNOSIS — Z992 Dependence on renal dialysis: Secondary | ICD-10-CM | POA: Diagnosis not present

## 2021-06-25 DIAGNOSIS — N2581 Secondary hyperparathyroidism of renal origin: Secondary | ICD-10-CM | POA: Diagnosis not present

## 2021-06-25 DIAGNOSIS — N186 End stage renal disease: Secondary | ICD-10-CM | POA: Diagnosis not present

## 2021-06-25 DIAGNOSIS — D509 Iron deficiency anemia, unspecified: Secondary | ICD-10-CM | POA: Diagnosis not present

## 2021-06-25 DIAGNOSIS — D472 Monoclonal gammopathy: Secondary | ICD-10-CM | POA: Diagnosis not present

## 2021-06-27 DIAGNOSIS — Z992 Dependence on renal dialysis: Secondary | ICD-10-CM | POA: Diagnosis not present

## 2021-06-27 DIAGNOSIS — N186 End stage renal disease: Secondary | ICD-10-CM | POA: Diagnosis not present

## 2021-06-27 DIAGNOSIS — N2581 Secondary hyperparathyroidism of renal origin: Secondary | ICD-10-CM | POA: Diagnosis not present

## 2021-06-27 DIAGNOSIS — D472 Monoclonal gammopathy: Secondary | ICD-10-CM | POA: Diagnosis not present

## 2021-06-27 DIAGNOSIS — D689 Coagulation defect, unspecified: Secondary | ICD-10-CM | POA: Diagnosis not present

## 2021-06-27 DIAGNOSIS — D509 Iron deficiency anemia, unspecified: Secondary | ICD-10-CM | POA: Diagnosis not present

## 2021-06-29 DIAGNOSIS — H353131 Nonexudative age-related macular degeneration, bilateral, early dry stage: Secondary | ICD-10-CM | POA: Diagnosis not present

## 2021-06-29 DIAGNOSIS — Z961 Presence of intraocular lens: Secondary | ICD-10-CM | POA: Diagnosis not present

## 2021-06-29 DIAGNOSIS — H35033 Hypertensive retinopathy, bilateral: Secondary | ICD-10-CM | POA: Diagnosis not present

## 2021-06-30 DIAGNOSIS — Z992 Dependence on renal dialysis: Secondary | ICD-10-CM | POA: Diagnosis not present

## 2021-06-30 DIAGNOSIS — D472 Monoclonal gammopathy: Secondary | ICD-10-CM | POA: Diagnosis not present

## 2021-06-30 DIAGNOSIS — N186 End stage renal disease: Secondary | ICD-10-CM | POA: Diagnosis not present

## 2021-06-30 DIAGNOSIS — D509 Iron deficiency anemia, unspecified: Secondary | ICD-10-CM | POA: Diagnosis not present

## 2021-06-30 DIAGNOSIS — D689 Coagulation defect, unspecified: Secondary | ICD-10-CM | POA: Diagnosis not present

## 2021-06-30 DIAGNOSIS — N2581 Secondary hyperparathyroidism of renal origin: Secondary | ICD-10-CM | POA: Diagnosis not present

## 2021-07-02 DIAGNOSIS — D689 Coagulation defect, unspecified: Secondary | ICD-10-CM | POA: Diagnosis not present

## 2021-07-02 DIAGNOSIS — D472 Monoclonal gammopathy: Secondary | ICD-10-CM | POA: Diagnosis not present

## 2021-07-02 DIAGNOSIS — D509 Iron deficiency anemia, unspecified: Secondary | ICD-10-CM | POA: Diagnosis not present

## 2021-07-02 DIAGNOSIS — N186 End stage renal disease: Secondary | ICD-10-CM | POA: Diagnosis not present

## 2021-07-02 DIAGNOSIS — Z992 Dependence on renal dialysis: Secondary | ICD-10-CM | POA: Diagnosis not present

## 2021-07-02 DIAGNOSIS — N2581 Secondary hyperparathyroidism of renal origin: Secondary | ICD-10-CM | POA: Diagnosis not present

## 2021-07-04 DIAGNOSIS — D689 Coagulation defect, unspecified: Secondary | ICD-10-CM | POA: Diagnosis not present

## 2021-07-04 DIAGNOSIS — N2581 Secondary hyperparathyroidism of renal origin: Secondary | ICD-10-CM | POA: Diagnosis not present

## 2021-07-04 DIAGNOSIS — D472 Monoclonal gammopathy: Secondary | ICD-10-CM | POA: Diagnosis not present

## 2021-07-04 DIAGNOSIS — D509 Iron deficiency anemia, unspecified: Secondary | ICD-10-CM | POA: Diagnosis not present

## 2021-07-04 DIAGNOSIS — N186 End stage renal disease: Secondary | ICD-10-CM | POA: Diagnosis not present

## 2021-07-04 DIAGNOSIS — Z992 Dependence on renal dialysis: Secondary | ICD-10-CM | POA: Diagnosis not present

## 2021-07-07 DIAGNOSIS — D472 Monoclonal gammopathy: Secondary | ICD-10-CM | POA: Diagnosis not present

## 2021-07-07 DIAGNOSIS — N186 End stage renal disease: Secondary | ICD-10-CM | POA: Diagnosis not present

## 2021-07-07 DIAGNOSIS — N2581 Secondary hyperparathyroidism of renal origin: Secondary | ICD-10-CM | POA: Diagnosis not present

## 2021-07-07 DIAGNOSIS — D509 Iron deficiency anemia, unspecified: Secondary | ICD-10-CM | POA: Diagnosis not present

## 2021-07-07 DIAGNOSIS — D689 Coagulation defect, unspecified: Secondary | ICD-10-CM | POA: Diagnosis not present

## 2021-07-07 DIAGNOSIS — Z992 Dependence on renal dialysis: Secondary | ICD-10-CM | POA: Diagnosis not present

## 2021-07-09 DIAGNOSIS — Z992 Dependence on renal dialysis: Secondary | ICD-10-CM | POA: Diagnosis not present

## 2021-07-09 DIAGNOSIS — N2581 Secondary hyperparathyroidism of renal origin: Secondary | ICD-10-CM | POA: Diagnosis not present

## 2021-07-09 DIAGNOSIS — D689 Coagulation defect, unspecified: Secondary | ICD-10-CM | POA: Diagnosis not present

## 2021-07-09 DIAGNOSIS — N186 End stage renal disease: Secondary | ICD-10-CM | POA: Diagnosis not present

## 2021-07-09 DIAGNOSIS — D472 Monoclonal gammopathy: Secondary | ICD-10-CM | POA: Diagnosis not present

## 2021-07-09 DIAGNOSIS — D509 Iron deficiency anemia, unspecified: Secondary | ICD-10-CM | POA: Diagnosis not present

## 2021-07-11 DIAGNOSIS — D689 Coagulation defect, unspecified: Secondary | ICD-10-CM | POA: Diagnosis not present

## 2021-07-11 DIAGNOSIS — N2581 Secondary hyperparathyroidism of renal origin: Secondary | ICD-10-CM | POA: Diagnosis not present

## 2021-07-11 DIAGNOSIS — Z992 Dependence on renal dialysis: Secondary | ICD-10-CM | POA: Diagnosis not present

## 2021-07-11 DIAGNOSIS — N186 End stage renal disease: Secondary | ICD-10-CM | POA: Diagnosis not present

## 2021-07-11 DIAGNOSIS — D509 Iron deficiency anemia, unspecified: Secondary | ICD-10-CM | POA: Diagnosis not present

## 2021-07-11 DIAGNOSIS — D472 Monoclonal gammopathy: Secondary | ICD-10-CM | POA: Diagnosis not present

## 2021-07-14 DIAGNOSIS — D472 Monoclonal gammopathy: Secondary | ICD-10-CM | POA: Diagnosis not present

## 2021-07-14 DIAGNOSIS — N186 End stage renal disease: Secondary | ICD-10-CM | POA: Diagnosis not present

## 2021-07-14 DIAGNOSIS — N2581 Secondary hyperparathyroidism of renal origin: Secondary | ICD-10-CM | POA: Diagnosis not present

## 2021-07-14 DIAGNOSIS — Z992 Dependence on renal dialysis: Secondary | ICD-10-CM | POA: Diagnosis not present

## 2021-07-14 DIAGNOSIS — D689 Coagulation defect, unspecified: Secondary | ICD-10-CM | POA: Diagnosis not present

## 2021-07-14 DIAGNOSIS — D509 Iron deficiency anemia, unspecified: Secondary | ICD-10-CM | POA: Diagnosis not present

## 2021-07-16 DIAGNOSIS — D689 Coagulation defect, unspecified: Secondary | ICD-10-CM | POA: Diagnosis not present

## 2021-07-16 DIAGNOSIS — D509 Iron deficiency anemia, unspecified: Secondary | ICD-10-CM | POA: Diagnosis not present

## 2021-07-16 DIAGNOSIS — N186 End stage renal disease: Secondary | ICD-10-CM | POA: Diagnosis not present

## 2021-07-16 DIAGNOSIS — D472 Monoclonal gammopathy: Secondary | ICD-10-CM | POA: Diagnosis not present

## 2021-07-16 DIAGNOSIS — N2581 Secondary hyperparathyroidism of renal origin: Secondary | ICD-10-CM | POA: Diagnosis not present

## 2021-07-16 DIAGNOSIS — Z992 Dependence on renal dialysis: Secondary | ICD-10-CM | POA: Diagnosis not present

## 2021-07-18 DIAGNOSIS — N2581 Secondary hyperparathyroidism of renal origin: Secondary | ICD-10-CM | POA: Diagnosis not present

## 2021-07-18 DIAGNOSIS — N186 End stage renal disease: Secondary | ICD-10-CM | POA: Diagnosis not present

## 2021-07-18 DIAGNOSIS — D689 Coagulation defect, unspecified: Secondary | ICD-10-CM | POA: Diagnosis not present

## 2021-07-18 DIAGNOSIS — D472 Monoclonal gammopathy: Secondary | ICD-10-CM | POA: Diagnosis not present

## 2021-07-18 DIAGNOSIS — D509 Iron deficiency anemia, unspecified: Secondary | ICD-10-CM | POA: Diagnosis not present

## 2021-07-18 DIAGNOSIS — Z992 Dependence on renal dialysis: Secondary | ICD-10-CM | POA: Diagnosis not present

## 2021-07-21 DIAGNOSIS — D689 Coagulation defect, unspecified: Secondary | ICD-10-CM | POA: Diagnosis not present

## 2021-07-21 DIAGNOSIS — Z992 Dependence on renal dialysis: Secondary | ICD-10-CM | POA: Diagnosis not present

## 2021-07-21 DIAGNOSIS — D509 Iron deficiency anemia, unspecified: Secondary | ICD-10-CM | POA: Diagnosis not present

## 2021-07-21 DIAGNOSIS — N2581 Secondary hyperparathyroidism of renal origin: Secondary | ICD-10-CM | POA: Diagnosis not present

## 2021-07-21 DIAGNOSIS — N186 End stage renal disease: Secondary | ICD-10-CM | POA: Diagnosis not present

## 2021-07-21 DIAGNOSIS — D472 Monoclonal gammopathy: Secondary | ICD-10-CM | POA: Diagnosis not present

## 2021-07-23 DIAGNOSIS — N186 End stage renal disease: Secondary | ICD-10-CM | POA: Diagnosis not present

## 2021-07-23 DIAGNOSIS — D689 Coagulation defect, unspecified: Secondary | ICD-10-CM | POA: Diagnosis not present

## 2021-07-23 DIAGNOSIS — D472 Monoclonal gammopathy: Secondary | ICD-10-CM | POA: Diagnosis not present

## 2021-07-23 DIAGNOSIS — N2581 Secondary hyperparathyroidism of renal origin: Secondary | ICD-10-CM | POA: Diagnosis not present

## 2021-07-23 DIAGNOSIS — Z992 Dependence on renal dialysis: Secondary | ICD-10-CM | POA: Diagnosis not present

## 2021-07-24 DIAGNOSIS — N186 End stage renal disease: Secondary | ICD-10-CM | POA: Diagnosis not present

## 2021-07-24 DIAGNOSIS — I129 Hypertensive chronic kidney disease with stage 1 through stage 4 chronic kidney disease, or unspecified chronic kidney disease: Secondary | ICD-10-CM | POA: Diagnosis not present

## 2021-07-24 DIAGNOSIS — Z992 Dependence on renal dialysis: Secondary | ICD-10-CM | POA: Diagnosis not present

## 2021-07-25 DIAGNOSIS — Z992 Dependence on renal dialysis: Secondary | ICD-10-CM | POA: Diagnosis not present

## 2021-07-25 DIAGNOSIS — D689 Coagulation defect, unspecified: Secondary | ICD-10-CM | POA: Diagnosis not present

## 2021-07-25 DIAGNOSIS — D509 Iron deficiency anemia, unspecified: Secondary | ICD-10-CM | POA: Diagnosis not present

## 2021-07-25 DIAGNOSIS — D472 Monoclonal gammopathy: Secondary | ICD-10-CM | POA: Diagnosis not present

## 2021-07-25 DIAGNOSIS — N2581 Secondary hyperparathyroidism of renal origin: Secondary | ICD-10-CM | POA: Diagnosis not present

## 2021-07-25 DIAGNOSIS — N186 End stage renal disease: Secondary | ICD-10-CM | POA: Diagnosis not present

## 2021-07-28 DIAGNOSIS — N2581 Secondary hyperparathyroidism of renal origin: Secondary | ICD-10-CM | POA: Diagnosis not present

## 2021-07-28 DIAGNOSIS — D509 Iron deficiency anemia, unspecified: Secondary | ICD-10-CM | POA: Diagnosis not present

## 2021-07-28 DIAGNOSIS — Z992 Dependence on renal dialysis: Secondary | ICD-10-CM | POA: Diagnosis not present

## 2021-07-28 DIAGNOSIS — D472 Monoclonal gammopathy: Secondary | ICD-10-CM | POA: Diagnosis not present

## 2021-07-28 DIAGNOSIS — D689 Coagulation defect, unspecified: Secondary | ICD-10-CM | POA: Diagnosis not present

## 2021-07-28 DIAGNOSIS — N186 End stage renal disease: Secondary | ICD-10-CM | POA: Diagnosis not present

## 2021-07-30 DIAGNOSIS — N186 End stage renal disease: Secondary | ICD-10-CM | POA: Diagnosis not present

## 2021-07-30 DIAGNOSIS — D472 Monoclonal gammopathy: Secondary | ICD-10-CM | POA: Diagnosis not present

## 2021-07-30 DIAGNOSIS — Z992 Dependence on renal dialysis: Secondary | ICD-10-CM | POA: Diagnosis not present

## 2021-07-30 DIAGNOSIS — N2581 Secondary hyperparathyroidism of renal origin: Secondary | ICD-10-CM | POA: Diagnosis not present

## 2021-07-30 DIAGNOSIS — D509 Iron deficiency anemia, unspecified: Secondary | ICD-10-CM | POA: Diagnosis not present

## 2021-07-30 DIAGNOSIS — D689 Coagulation defect, unspecified: Secondary | ICD-10-CM | POA: Diagnosis not present

## 2021-08-01 DIAGNOSIS — Z992 Dependence on renal dialysis: Secondary | ICD-10-CM | POA: Diagnosis not present

## 2021-08-01 DIAGNOSIS — N186 End stage renal disease: Secondary | ICD-10-CM | POA: Diagnosis not present

## 2021-08-01 DIAGNOSIS — N2581 Secondary hyperparathyroidism of renal origin: Secondary | ICD-10-CM | POA: Diagnosis not present

## 2021-08-01 DIAGNOSIS — D509 Iron deficiency anemia, unspecified: Secondary | ICD-10-CM | POA: Diagnosis not present

## 2021-08-01 DIAGNOSIS — D472 Monoclonal gammopathy: Secondary | ICD-10-CM | POA: Diagnosis not present

## 2021-08-01 DIAGNOSIS — D689 Coagulation defect, unspecified: Secondary | ICD-10-CM | POA: Diagnosis not present

## 2021-08-04 DIAGNOSIS — D689 Coagulation defect, unspecified: Secondary | ICD-10-CM | POA: Diagnosis not present

## 2021-08-04 DIAGNOSIS — N186 End stage renal disease: Secondary | ICD-10-CM | POA: Diagnosis not present

## 2021-08-04 DIAGNOSIS — N2581 Secondary hyperparathyroidism of renal origin: Secondary | ICD-10-CM | POA: Diagnosis not present

## 2021-08-04 DIAGNOSIS — D472 Monoclonal gammopathy: Secondary | ICD-10-CM | POA: Diagnosis not present

## 2021-08-04 DIAGNOSIS — D509 Iron deficiency anemia, unspecified: Secondary | ICD-10-CM | POA: Diagnosis not present

## 2021-08-04 DIAGNOSIS — Z992 Dependence on renal dialysis: Secondary | ICD-10-CM | POA: Diagnosis not present

## 2021-08-06 DIAGNOSIS — D689 Coagulation defect, unspecified: Secondary | ICD-10-CM | POA: Diagnosis not present

## 2021-08-06 DIAGNOSIS — Z992 Dependence on renal dialysis: Secondary | ICD-10-CM | POA: Diagnosis not present

## 2021-08-06 DIAGNOSIS — D472 Monoclonal gammopathy: Secondary | ICD-10-CM | POA: Diagnosis not present

## 2021-08-06 DIAGNOSIS — N2581 Secondary hyperparathyroidism of renal origin: Secondary | ICD-10-CM | POA: Diagnosis not present

## 2021-08-06 DIAGNOSIS — D509 Iron deficiency anemia, unspecified: Secondary | ICD-10-CM | POA: Diagnosis not present

## 2021-08-06 DIAGNOSIS — N186 End stage renal disease: Secondary | ICD-10-CM | POA: Diagnosis not present

## 2021-08-08 DIAGNOSIS — D472 Monoclonal gammopathy: Secondary | ICD-10-CM | POA: Diagnosis not present

## 2021-08-08 DIAGNOSIS — N2581 Secondary hyperparathyroidism of renal origin: Secondary | ICD-10-CM | POA: Diagnosis not present

## 2021-08-08 DIAGNOSIS — D689 Coagulation defect, unspecified: Secondary | ICD-10-CM | POA: Diagnosis not present

## 2021-08-08 DIAGNOSIS — D509 Iron deficiency anemia, unspecified: Secondary | ICD-10-CM | POA: Diagnosis not present

## 2021-08-08 DIAGNOSIS — N186 End stage renal disease: Secondary | ICD-10-CM | POA: Diagnosis not present

## 2021-08-08 DIAGNOSIS — Z992 Dependence on renal dialysis: Secondary | ICD-10-CM | POA: Diagnosis not present

## 2021-08-11 DIAGNOSIS — D472 Monoclonal gammopathy: Secondary | ICD-10-CM | POA: Diagnosis not present

## 2021-08-11 DIAGNOSIS — N2581 Secondary hyperparathyroidism of renal origin: Secondary | ICD-10-CM | POA: Diagnosis not present

## 2021-08-11 DIAGNOSIS — Z992 Dependence on renal dialysis: Secondary | ICD-10-CM | POA: Diagnosis not present

## 2021-08-11 DIAGNOSIS — D689 Coagulation defect, unspecified: Secondary | ICD-10-CM | POA: Diagnosis not present

## 2021-08-11 DIAGNOSIS — N186 End stage renal disease: Secondary | ICD-10-CM | POA: Diagnosis not present

## 2021-08-11 DIAGNOSIS — D509 Iron deficiency anemia, unspecified: Secondary | ICD-10-CM | POA: Diagnosis not present

## 2021-08-13 DIAGNOSIS — N2581 Secondary hyperparathyroidism of renal origin: Secondary | ICD-10-CM | POA: Diagnosis not present

## 2021-08-13 DIAGNOSIS — Z992 Dependence on renal dialysis: Secondary | ICD-10-CM | POA: Diagnosis not present

## 2021-08-13 DIAGNOSIS — D472 Monoclonal gammopathy: Secondary | ICD-10-CM | POA: Diagnosis not present

## 2021-08-13 DIAGNOSIS — D509 Iron deficiency anemia, unspecified: Secondary | ICD-10-CM | POA: Diagnosis not present

## 2021-08-13 DIAGNOSIS — D689 Coagulation defect, unspecified: Secondary | ICD-10-CM | POA: Diagnosis not present

## 2021-08-13 DIAGNOSIS — N186 End stage renal disease: Secondary | ICD-10-CM | POA: Diagnosis not present

## 2021-08-15 DIAGNOSIS — N186 End stage renal disease: Secondary | ICD-10-CM | POA: Diagnosis not present

## 2021-08-15 DIAGNOSIS — Z992 Dependence on renal dialysis: Secondary | ICD-10-CM | POA: Diagnosis not present

## 2021-08-15 DIAGNOSIS — D689 Coagulation defect, unspecified: Secondary | ICD-10-CM | POA: Diagnosis not present

## 2021-08-15 DIAGNOSIS — N2581 Secondary hyperparathyroidism of renal origin: Secondary | ICD-10-CM | POA: Diagnosis not present

## 2021-08-15 DIAGNOSIS — D472 Monoclonal gammopathy: Secondary | ICD-10-CM | POA: Diagnosis not present

## 2021-08-15 DIAGNOSIS — D509 Iron deficiency anemia, unspecified: Secondary | ICD-10-CM | POA: Diagnosis not present

## 2021-08-18 DIAGNOSIS — D509 Iron deficiency anemia, unspecified: Secondary | ICD-10-CM | POA: Diagnosis not present

## 2021-08-18 DIAGNOSIS — D472 Monoclonal gammopathy: Secondary | ICD-10-CM | POA: Diagnosis not present

## 2021-08-18 DIAGNOSIS — Z992 Dependence on renal dialysis: Secondary | ICD-10-CM | POA: Diagnosis not present

## 2021-08-18 DIAGNOSIS — D689 Coagulation defect, unspecified: Secondary | ICD-10-CM | POA: Diagnosis not present

## 2021-08-18 DIAGNOSIS — N2581 Secondary hyperparathyroidism of renal origin: Secondary | ICD-10-CM | POA: Diagnosis not present

## 2021-08-18 DIAGNOSIS — N186 End stage renal disease: Secondary | ICD-10-CM | POA: Diagnosis not present

## 2021-08-20 DIAGNOSIS — N2581 Secondary hyperparathyroidism of renal origin: Secondary | ICD-10-CM | POA: Diagnosis not present

## 2021-08-20 DIAGNOSIS — Z992 Dependence on renal dialysis: Secondary | ICD-10-CM | POA: Diagnosis not present

## 2021-08-20 DIAGNOSIS — D509 Iron deficiency anemia, unspecified: Secondary | ICD-10-CM | POA: Diagnosis not present

## 2021-08-20 DIAGNOSIS — D689 Coagulation defect, unspecified: Secondary | ICD-10-CM | POA: Diagnosis not present

## 2021-08-20 DIAGNOSIS — D472 Monoclonal gammopathy: Secondary | ICD-10-CM | POA: Diagnosis not present

## 2021-08-20 DIAGNOSIS — N186 End stage renal disease: Secondary | ICD-10-CM | POA: Diagnosis not present

## 2021-08-22 DIAGNOSIS — Z992 Dependence on renal dialysis: Secondary | ICD-10-CM | POA: Diagnosis not present

## 2021-08-22 DIAGNOSIS — N186 End stage renal disease: Secondary | ICD-10-CM | POA: Diagnosis not present

## 2021-08-22 DIAGNOSIS — N2581 Secondary hyperparathyroidism of renal origin: Secondary | ICD-10-CM | POA: Diagnosis not present

## 2021-08-22 DIAGNOSIS — D689 Coagulation defect, unspecified: Secondary | ICD-10-CM | POA: Diagnosis not present

## 2021-08-22 DIAGNOSIS — D509 Iron deficiency anemia, unspecified: Secondary | ICD-10-CM | POA: Diagnosis not present

## 2021-08-22 DIAGNOSIS — D472 Monoclonal gammopathy: Secondary | ICD-10-CM | POA: Diagnosis not present

## 2021-08-23 DIAGNOSIS — Z992 Dependence on renal dialysis: Secondary | ICD-10-CM | POA: Diagnosis not present

## 2021-08-23 DIAGNOSIS — I129 Hypertensive chronic kidney disease with stage 1 through stage 4 chronic kidney disease, or unspecified chronic kidney disease: Secondary | ICD-10-CM | POA: Diagnosis not present

## 2021-08-23 DIAGNOSIS — N186 End stage renal disease: Secondary | ICD-10-CM | POA: Diagnosis not present

## 2021-08-25 DIAGNOSIS — D689 Coagulation defect, unspecified: Secondary | ICD-10-CM | POA: Diagnosis not present

## 2021-08-25 DIAGNOSIS — D509 Iron deficiency anemia, unspecified: Secondary | ICD-10-CM | POA: Diagnosis not present

## 2021-08-25 DIAGNOSIS — Z992 Dependence on renal dialysis: Secondary | ICD-10-CM | POA: Diagnosis not present

## 2021-08-25 DIAGNOSIS — D472 Monoclonal gammopathy: Secondary | ICD-10-CM | POA: Diagnosis not present

## 2021-08-25 DIAGNOSIS — N186 End stage renal disease: Secondary | ICD-10-CM | POA: Diagnosis not present

## 2021-08-25 DIAGNOSIS — N2581 Secondary hyperparathyroidism of renal origin: Secondary | ICD-10-CM | POA: Diagnosis not present

## 2021-08-27 DIAGNOSIS — N186 End stage renal disease: Secondary | ICD-10-CM | POA: Diagnosis not present

## 2021-08-27 DIAGNOSIS — Z992 Dependence on renal dialysis: Secondary | ICD-10-CM | POA: Diagnosis not present

## 2021-08-27 DIAGNOSIS — D472 Monoclonal gammopathy: Secondary | ICD-10-CM | POA: Diagnosis not present

## 2021-08-27 DIAGNOSIS — N2581 Secondary hyperparathyroidism of renal origin: Secondary | ICD-10-CM | POA: Diagnosis not present

## 2021-08-27 DIAGNOSIS — D689 Coagulation defect, unspecified: Secondary | ICD-10-CM | POA: Diagnosis not present

## 2021-08-27 DIAGNOSIS — D509 Iron deficiency anemia, unspecified: Secondary | ICD-10-CM | POA: Diagnosis not present

## 2021-08-29 DIAGNOSIS — D472 Monoclonal gammopathy: Secondary | ICD-10-CM | POA: Diagnosis not present

## 2021-08-29 DIAGNOSIS — Z992 Dependence on renal dialysis: Secondary | ICD-10-CM | POA: Diagnosis not present

## 2021-08-29 DIAGNOSIS — D689 Coagulation defect, unspecified: Secondary | ICD-10-CM | POA: Diagnosis not present

## 2021-08-29 DIAGNOSIS — D509 Iron deficiency anemia, unspecified: Secondary | ICD-10-CM | POA: Diagnosis not present

## 2021-08-29 DIAGNOSIS — N2581 Secondary hyperparathyroidism of renal origin: Secondary | ICD-10-CM | POA: Diagnosis not present

## 2021-08-29 DIAGNOSIS — N186 End stage renal disease: Secondary | ICD-10-CM | POA: Diagnosis not present

## 2021-09-01 DIAGNOSIS — N2581 Secondary hyperparathyroidism of renal origin: Secondary | ICD-10-CM | POA: Diagnosis not present

## 2021-09-01 DIAGNOSIS — N186 End stage renal disease: Secondary | ICD-10-CM | POA: Diagnosis not present

## 2021-09-01 DIAGNOSIS — D472 Monoclonal gammopathy: Secondary | ICD-10-CM | POA: Diagnosis not present

## 2021-09-01 DIAGNOSIS — D509 Iron deficiency anemia, unspecified: Secondary | ICD-10-CM | POA: Diagnosis not present

## 2021-09-01 DIAGNOSIS — Z992 Dependence on renal dialysis: Secondary | ICD-10-CM | POA: Diagnosis not present

## 2021-09-01 DIAGNOSIS — D689 Coagulation defect, unspecified: Secondary | ICD-10-CM | POA: Diagnosis not present

## 2021-09-03 DIAGNOSIS — D472 Monoclonal gammopathy: Secondary | ICD-10-CM | POA: Diagnosis not present

## 2021-09-03 DIAGNOSIS — D509 Iron deficiency anemia, unspecified: Secondary | ICD-10-CM | POA: Diagnosis not present

## 2021-09-03 DIAGNOSIS — Z992 Dependence on renal dialysis: Secondary | ICD-10-CM | POA: Diagnosis not present

## 2021-09-03 DIAGNOSIS — N2581 Secondary hyperparathyroidism of renal origin: Secondary | ICD-10-CM | POA: Diagnosis not present

## 2021-09-03 DIAGNOSIS — N186 End stage renal disease: Secondary | ICD-10-CM | POA: Diagnosis not present

## 2021-09-03 DIAGNOSIS — D689 Coagulation defect, unspecified: Secondary | ICD-10-CM | POA: Diagnosis not present

## 2021-09-05 DIAGNOSIS — Z992 Dependence on renal dialysis: Secondary | ICD-10-CM | POA: Diagnosis not present

## 2021-09-05 DIAGNOSIS — N186 End stage renal disease: Secondary | ICD-10-CM | POA: Diagnosis not present

## 2021-09-05 DIAGNOSIS — N2581 Secondary hyperparathyroidism of renal origin: Secondary | ICD-10-CM | POA: Diagnosis not present

## 2021-09-05 DIAGNOSIS — D509 Iron deficiency anemia, unspecified: Secondary | ICD-10-CM | POA: Diagnosis not present

## 2021-09-05 DIAGNOSIS — D689 Coagulation defect, unspecified: Secondary | ICD-10-CM | POA: Diagnosis not present

## 2021-09-05 DIAGNOSIS — D472 Monoclonal gammopathy: Secondary | ICD-10-CM | POA: Diagnosis not present

## 2021-09-08 DIAGNOSIS — Z992 Dependence on renal dialysis: Secondary | ICD-10-CM | POA: Diagnosis not present

## 2021-09-08 DIAGNOSIS — D689 Coagulation defect, unspecified: Secondary | ICD-10-CM | POA: Diagnosis not present

## 2021-09-08 DIAGNOSIS — D472 Monoclonal gammopathy: Secondary | ICD-10-CM | POA: Diagnosis not present

## 2021-09-08 DIAGNOSIS — D509 Iron deficiency anemia, unspecified: Secondary | ICD-10-CM | POA: Diagnosis not present

## 2021-09-08 DIAGNOSIS — N186 End stage renal disease: Secondary | ICD-10-CM | POA: Diagnosis not present

## 2021-09-08 DIAGNOSIS — N2581 Secondary hyperparathyroidism of renal origin: Secondary | ICD-10-CM | POA: Diagnosis not present

## 2021-09-10 DIAGNOSIS — N2581 Secondary hyperparathyroidism of renal origin: Secondary | ICD-10-CM | POA: Diagnosis not present

## 2021-09-10 DIAGNOSIS — D689 Coagulation defect, unspecified: Secondary | ICD-10-CM | POA: Diagnosis not present

## 2021-09-10 DIAGNOSIS — D509 Iron deficiency anemia, unspecified: Secondary | ICD-10-CM | POA: Diagnosis not present

## 2021-09-10 DIAGNOSIS — D472 Monoclonal gammopathy: Secondary | ICD-10-CM | POA: Diagnosis not present

## 2021-09-10 DIAGNOSIS — Z992 Dependence on renal dialysis: Secondary | ICD-10-CM | POA: Diagnosis not present

## 2021-09-10 DIAGNOSIS — N186 End stage renal disease: Secondary | ICD-10-CM | POA: Diagnosis not present

## 2021-09-12 DIAGNOSIS — N2581 Secondary hyperparathyroidism of renal origin: Secondary | ICD-10-CM | POA: Diagnosis not present

## 2021-09-12 DIAGNOSIS — D509 Iron deficiency anemia, unspecified: Secondary | ICD-10-CM | POA: Diagnosis not present

## 2021-09-12 DIAGNOSIS — Z992 Dependence on renal dialysis: Secondary | ICD-10-CM | POA: Diagnosis not present

## 2021-09-12 DIAGNOSIS — D472 Monoclonal gammopathy: Secondary | ICD-10-CM | POA: Diagnosis not present

## 2021-09-12 DIAGNOSIS — N186 End stage renal disease: Secondary | ICD-10-CM | POA: Diagnosis not present

## 2021-09-12 DIAGNOSIS — D689 Coagulation defect, unspecified: Secondary | ICD-10-CM | POA: Diagnosis not present

## 2021-09-15 DIAGNOSIS — D509 Iron deficiency anemia, unspecified: Secondary | ICD-10-CM | POA: Diagnosis not present

## 2021-09-15 DIAGNOSIS — N186 End stage renal disease: Secondary | ICD-10-CM | POA: Diagnosis not present

## 2021-09-15 DIAGNOSIS — D472 Monoclonal gammopathy: Secondary | ICD-10-CM | POA: Diagnosis not present

## 2021-09-15 DIAGNOSIS — N2581 Secondary hyperparathyroidism of renal origin: Secondary | ICD-10-CM | POA: Diagnosis not present

## 2021-09-15 DIAGNOSIS — D689 Coagulation defect, unspecified: Secondary | ICD-10-CM | POA: Diagnosis not present

## 2021-09-15 DIAGNOSIS — Z992 Dependence on renal dialysis: Secondary | ICD-10-CM | POA: Diagnosis not present

## 2021-09-17 DIAGNOSIS — N2581 Secondary hyperparathyroidism of renal origin: Secondary | ICD-10-CM | POA: Diagnosis not present

## 2021-09-17 DIAGNOSIS — D689 Coagulation defect, unspecified: Secondary | ICD-10-CM | POA: Diagnosis not present

## 2021-09-17 DIAGNOSIS — Z992 Dependence on renal dialysis: Secondary | ICD-10-CM | POA: Diagnosis not present

## 2021-09-17 DIAGNOSIS — N186 End stage renal disease: Secondary | ICD-10-CM | POA: Diagnosis not present

## 2021-09-17 DIAGNOSIS — D472 Monoclonal gammopathy: Secondary | ICD-10-CM | POA: Diagnosis not present

## 2021-09-17 DIAGNOSIS — D509 Iron deficiency anemia, unspecified: Secondary | ICD-10-CM | POA: Diagnosis not present

## 2021-09-19 DIAGNOSIS — D472 Monoclonal gammopathy: Secondary | ICD-10-CM | POA: Diagnosis not present

## 2021-09-19 DIAGNOSIS — N186 End stage renal disease: Secondary | ICD-10-CM | POA: Diagnosis not present

## 2021-09-19 DIAGNOSIS — D689 Coagulation defect, unspecified: Secondary | ICD-10-CM | POA: Diagnosis not present

## 2021-09-19 DIAGNOSIS — N2581 Secondary hyperparathyroidism of renal origin: Secondary | ICD-10-CM | POA: Diagnosis not present

## 2021-09-19 DIAGNOSIS — Z992 Dependence on renal dialysis: Secondary | ICD-10-CM | POA: Diagnosis not present

## 2021-09-19 DIAGNOSIS — D509 Iron deficiency anemia, unspecified: Secondary | ICD-10-CM | POA: Diagnosis not present

## 2021-09-22 DIAGNOSIS — D509 Iron deficiency anemia, unspecified: Secondary | ICD-10-CM | POA: Diagnosis not present

## 2021-09-22 DIAGNOSIS — Z992 Dependence on renal dialysis: Secondary | ICD-10-CM | POA: Diagnosis not present

## 2021-09-22 DIAGNOSIS — N186 End stage renal disease: Secondary | ICD-10-CM | POA: Diagnosis not present

## 2021-09-22 DIAGNOSIS — D689 Coagulation defect, unspecified: Secondary | ICD-10-CM | POA: Diagnosis not present

## 2021-09-22 DIAGNOSIS — N2581 Secondary hyperparathyroidism of renal origin: Secondary | ICD-10-CM | POA: Diagnosis not present

## 2021-09-22 DIAGNOSIS — D472 Monoclonal gammopathy: Secondary | ICD-10-CM | POA: Diagnosis not present

## 2021-09-23 DIAGNOSIS — N186 End stage renal disease: Secondary | ICD-10-CM | POA: Diagnosis not present

## 2021-09-23 DIAGNOSIS — Z992 Dependence on renal dialysis: Secondary | ICD-10-CM | POA: Diagnosis not present

## 2021-09-23 DIAGNOSIS — I129 Hypertensive chronic kidney disease with stage 1 through stage 4 chronic kidney disease, or unspecified chronic kidney disease: Secondary | ICD-10-CM | POA: Diagnosis not present

## 2021-09-24 DIAGNOSIS — D509 Iron deficiency anemia, unspecified: Secondary | ICD-10-CM | POA: Diagnosis not present

## 2021-09-24 DIAGNOSIS — D689 Coagulation defect, unspecified: Secondary | ICD-10-CM | POA: Diagnosis not present

## 2021-09-24 DIAGNOSIS — D472 Monoclonal gammopathy: Secondary | ICD-10-CM | POA: Diagnosis not present

## 2021-09-24 DIAGNOSIS — Z992 Dependence on renal dialysis: Secondary | ICD-10-CM | POA: Diagnosis not present

## 2021-09-24 DIAGNOSIS — N2581 Secondary hyperparathyroidism of renal origin: Secondary | ICD-10-CM | POA: Diagnosis not present

## 2021-09-24 DIAGNOSIS — N186 End stage renal disease: Secondary | ICD-10-CM | POA: Diagnosis not present

## 2021-09-26 DIAGNOSIS — Z992 Dependence on renal dialysis: Secondary | ICD-10-CM | POA: Diagnosis not present

## 2021-09-26 DIAGNOSIS — N2581 Secondary hyperparathyroidism of renal origin: Secondary | ICD-10-CM | POA: Diagnosis not present

## 2021-09-26 DIAGNOSIS — D472 Monoclonal gammopathy: Secondary | ICD-10-CM | POA: Diagnosis not present

## 2021-09-26 DIAGNOSIS — D689 Coagulation defect, unspecified: Secondary | ICD-10-CM | POA: Diagnosis not present

## 2021-09-26 DIAGNOSIS — D509 Iron deficiency anemia, unspecified: Secondary | ICD-10-CM | POA: Diagnosis not present

## 2021-09-26 DIAGNOSIS — N186 End stage renal disease: Secondary | ICD-10-CM | POA: Diagnosis not present

## 2021-09-29 DIAGNOSIS — Z992 Dependence on renal dialysis: Secondary | ICD-10-CM | POA: Diagnosis not present

## 2021-09-29 DIAGNOSIS — D689 Coagulation defect, unspecified: Secondary | ICD-10-CM | POA: Diagnosis not present

## 2021-09-29 DIAGNOSIS — D509 Iron deficiency anemia, unspecified: Secondary | ICD-10-CM | POA: Diagnosis not present

## 2021-09-29 DIAGNOSIS — N186 End stage renal disease: Secondary | ICD-10-CM | POA: Diagnosis not present

## 2021-09-29 DIAGNOSIS — N2581 Secondary hyperparathyroidism of renal origin: Secondary | ICD-10-CM | POA: Diagnosis not present

## 2021-09-29 DIAGNOSIS — D472 Monoclonal gammopathy: Secondary | ICD-10-CM | POA: Diagnosis not present

## 2021-10-01 DIAGNOSIS — D509 Iron deficiency anemia, unspecified: Secondary | ICD-10-CM | POA: Diagnosis not present

## 2021-10-01 DIAGNOSIS — N186 End stage renal disease: Secondary | ICD-10-CM | POA: Diagnosis not present

## 2021-10-01 DIAGNOSIS — N2581 Secondary hyperparathyroidism of renal origin: Secondary | ICD-10-CM | POA: Diagnosis not present

## 2021-10-01 DIAGNOSIS — D689 Coagulation defect, unspecified: Secondary | ICD-10-CM | POA: Diagnosis not present

## 2021-10-01 DIAGNOSIS — Z992 Dependence on renal dialysis: Secondary | ICD-10-CM | POA: Diagnosis not present

## 2021-10-01 DIAGNOSIS — D472 Monoclonal gammopathy: Secondary | ICD-10-CM | POA: Diagnosis not present

## 2021-10-03 DIAGNOSIS — N186 End stage renal disease: Secondary | ICD-10-CM | POA: Diagnosis not present

## 2021-10-03 DIAGNOSIS — D472 Monoclonal gammopathy: Secondary | ICD-10-CM | POA: Diagnosis not present

## 2021-10-03 DIAGNOSIS — N2581 Secondary hyperparathyroidism of renal origin: Secondary | ICD-10-CM | POA: Diagnosis not present

## 2021-10-03 DIAGNOSIS — D509 Iron deficiency anemia, unspecified: Secondary | ICD-10-CM | POA: Diagnosis not present

## 2021-10-03 DIAGNOSIS — Z992 Dependence on renal dialysis: Secondary | ICD-10-CM | POA: Diagnosis not present

## 2021-10-03 DIAGNOSIS — D689 Coagulation defect, unspecified: Secondary | ICD-10-CM | POA: Diagnosis not present

## 2021-10-06 DIAGNOSIS — D509 Iron deficiency anemia, unspecified: Secondary | ICD-10-CM | POA: Diagnosis not present

## 2021-10-06 DIAGNOSIS — D472 Monoclonal gammopathy: Secondary | ICD-10-CM | POA: Diagnosis not present

## 2021-10-06 DIAGNOSIS — Z992 Dependence on renal dialysis: Secondary | ICD-10-CM | POA: Diagnosis not present

## 2021-10-06 DIAGNOSIS — N2581 Secondary hyperparathyroidism of renal origin: Secondary | ICD-10-CM | POA: Diagnosis not present

## 2021-10-06 DIAGNOSIS — D689 Coagulation defect, unspecified: Secondary | ICD-10-CM | POA: Diagnosis not present

## 2021-10-06 DIAGNOSIS — N186 End stage renal disease: Secondary | ICD-10-CM | POA: Diagnosis not present

## 2021-10-08 DIAGNOSIS — Z992 Dependence on renal dialysis: Secondary | ICD-10-CM | POA: Diagnosis not present

## 2021-10-08 DIAGNOSIS — D509 Iron deficiency anemia, unspecified: Secondary | ICD-10-CM | POA: Diagnosis not present

## 2021-10-08 DIAGNOSIS — N186 End stage renal disease: Secondary | ICD-10-CM | POA: Diagnosis not present

## 2021-10-08 DIAGNOSIS — D472 Monoclonal gammopathy: Secondary | ICD-10-CM | POA: Diagnosis not present

## 2021-10-08 DIAGNOSIS — N2581 Secondary hyperparathyroidism of renal origin: Secondary | ICD-10-CM | POA: Diagnosis not present

## 2021-10-08 DIAGNOSIS — D689 Coagulation defect, unspecified: Secondary | ICD-10-CM | POA: Diagnosis not present

## 2021-10-10 DIAGNOSIS — N2581 Secondary hyperparathyroidism of renal origin: Secondary | ICD-10-CM | POA: Diagnosis not present

## 2021-10-10 DIAGNOSIS — D472 Monoclonal gammopathy: Secondary | ICD-10-CM | POA: Diagnosis not present

## 2021-10-10 DIAGNOSIS — N186 End stage renal disease: Secondary | ICD-10-CM | POA: Diagnosis not present

## 2021-10-10 DIAGNOSIS — Z992 Dependence on renal dialysis: Secondary | ICD-10-CM | POA: Diagnosis not present

## 2021-10-10 DIAGNOSIS — D689 Coagulation defect, unspecified: Secondary | ICD-10-CM | POA: Diagnosis not present

## 2021-10-10 DIAGNOSIS — D509 Iron deficiency anemia, unspecified: Secondary | ICD-10-CM | POA: Diagnosis not present

## 2021-10-13 DIAGNOSIS — Z992 Dependence on renal dialysis: Secondary | ICD-10-CM | POA: Diagnosis not present

## 2021-10-13 DIAGNOSIS — N2581 Secondary hyperparathyroidism of renal origin: Secondary | ICD-10-CM | POA: Diagnosis not present

## 2021-10-13 DIAGNOSIS — D509 Iron deficiency anemia, unspecified: Secondary | ICD-10-CM | POA: Diagnosis not present

## 2021-10-13 DIAGNOSIS — N186 End stage renal disease: Secondary | ICD-10-CM | POA: Diagnosis not present

## 2021-10-13 DIAGNOSIS — D689 Coagulation defect, unspecified: Secondary | ICD-10-CM | POA: Diagnosis not present

## 2021-10-13 DIAGNOSIS — D472 Monoclonal gammopathy: Secondary | ICD-10-CM | POA: Diagnosis not present

## 2021-10-15 DIAGNOSIS — D509 Iron deficiency anemia, unspecified: Secondary | ICD-10-CM | POA: Diagnosis not present

## 2021-10-15 DIAGNOSIS — Z992 Dependence on renal dialysis: Secondary | ICD-10-CM | POA: Diagnosis not present

## 2021-10-15 DIAGNOSIS — D472 Monoclonal gammopathy: Secondary | ICD-10-CM | POA: Diagnosis not present

## 2021-10-15 DIAGNOSIS — D689 Coagulation defect, unspecified: Secondary | ICD-10-CM | POA: Diagnosis not present

## 2021-10-15 DIAGNOSIS — N186 End stage renal disease: Secondary | ICD-10-CM | POA: Diagnosis not present

## 2021-10-15 DIAGNOSIS — N2581 Secondary hyperparathyroidism of renal origin: Secondary | ICD-10-CM | POA: Diagnosis not present

## 2021-10-17 DIAGNOSIS — N2581 Secondary hyperparathyroidism of renal origin: Secondary | ICD-10-CM | POA: Diagnosis not present

## 2021-10-17 DIAGNOSIS — D472 Monoclonal gammopathy: Secondary | ICD-10-CM | POA: Diagnosis not present

## 2021-10-17 DIAGNOSIS — Z992 Dependence on renal dialysis: Secondary | ICD-10-CM | POA: Diagnosis not present

## 2021-10-17 DIAGNOSIS — N186 End stage renal disease: Secondary | ICD-10-CM | POA: Diagnosis not present

## 2021-10-17 DIAGNOSIS — D689 Coagulation defect, unspecified: Secondary | ICD-10-CM | POA: Diagnosis not present

## 2021-10-17 DIAGNOSIS — D509 Iron deficiency anemia, unspecified: Secondary | ICD-10-CM | POA: Diagnosis not present

## 2021-10-20 DIAGNOSIS — D689 Coagulation defect, unspecified: Secondary | ICD-10-CM | POA: Diagnosis not present

## 2021-10-20 DIAGNOSIS — N2581 Secondary hyperparathyroidism of renal origin: Secondary | ICD-10-CM | POA: Diagnosis not present

## 2021-10-20 DIAGNOSIS — D509 Iron deficiency anemia, unspecified: Secondary | ICD-10-CM | POA: Diagnosis not present

## 2021-10-20 DIAGNOSIS — Z992 Dependence on renal dialysis: Secondary | ICD-10-CM | POA: Diagnosis not present

## 2021-10-20 DIAGNOSIS — N186 End stage renal disease: Secondary | ICD-10-CM | POA: Diagnosis not present

## 2021-10-20 DIAGNOSIS — D472 Monoclonal gammopathy: Secondary | ICD-10-CM | POA: Diagnosis not present

## 2021-10-21 DIAGNOSIS — D044 Carcinoma in situ of skin of scalp and neck: Secondary | ICD-10-CM | POA: Diagnosis not present

## 2021-10-21 DIAGNOSIS — D0439 Carcinoma in situ of skin of other parts of face: Secondary | ICD-10-CM | POA: Diagnosis not present

## 2021-10-21 DIAGNOSIS — L570 Actinic keratosis: Secondary | ICD-10-CM | POA: Diagnosis not present

## 2021-10-21 DIAGNOSIS — X32XXXD Exposure to sunlight, subsequent encounter: Secondary | ICD-10-CM | POA: Diagnosis not present

## 2021-10-22 DIAGNOSIS — D472 Monoclonal gammopathy: Secondary | ICD-10-CM | POA: Diagnosis not present

## 2021-10-22 DIAGNOSIS — N186 End stage renal disease: Secondary | ICD-10-CM | POA: Diagnosis not present

## 2021-10-22 DIAGNOSIS — D689 Coagulation defect, unspecified: Secondary | ICD-10-CM | POA: Diagnosis not present

## 2021-10-22 DIAGNOSIS — D509 Iron deficiency anemia, unspecified: Secondary | ICD-10-CM | POA: Diagnosis not present

## 2021-10-22 DIAGNOSIS — Z992 Dependence on renal dialysis: Secondary | ICD-10-CM | POA: Diagnosis not present

## 2021-10-22 DIAGNOSIS — N2581 Secondary hyperparathyroidism of renal origin: Secondary | ICD-10-CM | POA: Diagnosis not present

## 2021-10-23 DIAGNOSIS — I129 Hypertensive chronic kidney disease with stage 1 through stage 4 chronic kidney disease, or unspecified chronic kidney disease: Secondary | ICD-10-CM | POA: Diagnosis not present

## 2021-10-23 DIAGNOSIS — Z992 Dependence on renal dialysis: Secondary | ICD-10-CM | POA: Diagnosis not present

## 2021-10-23 DIAGNOSIS — N186 End stage renal disease: Secondary | ICD-10-CM | POA: Diagnosis not present

## 2021-10-24 DIAGNOSIS — N2581 Secondary hyperparathyroidism of renal origin: Secondary | ICD-10-CM | POA: Diagnosis not present

## 2021-10-24 DIAGNOSIS — Z992 Dependence on renal dialysis: Secondary | ICD-10-CM | POA: Diagnosis not present

## 2021-10-24 DIAGNOSIS — D472 Monoclonal gammopathy: Secondary | ICD-10-CM | POA: Diagnosis not present

## 2021-10-24 DIAGNOSIS — D689 Coagulation defect, unspecified: Secondary | ICD-10-CM | POA: Diagnosis not present

## 2021-10-24 DIAGNOSIS — N186 End stage renal disease: Secondary | ICD-10-CM | POA: Diagnosis not present

## 2021-10-24 DIAGNOSIS — D509 Iron deficiency anemia, unspecified: Secondary | ICD-10-CM | POA: Diagnosis not present

## 2021-10-27 DIAGNOSIS — D472 Monoclonal gammopathy: Secondary | ICD-10-CM | POA: Diagnosis not present

## 2021-10-27 DIAGNOSIS — D689 Coagulation defect, unspecified: Secondary | ICD-10-CM | POA: Diagnosis not present

## 2021-10-27 DIAGNOSIS — N186 End stage renal disease: Secondary | ICD-10-CM | POA: Diagnosis not present

## 2021-10-27 DIAGNOSIS — N2581 Secondary hyperparathyroidism of renal origin: Secondary | ICD-10-CM | POA: Diagnosis not present

## 2021-10-27 DIAGNOSIS — D509 Iron deficiency anemia, unspecified: Secondary | ICD-10-CM | POA: Diagnosis not present

## 2021-10-27 DIAGNOSIS — Z992 Dependence on renal dialysis: Secondary | ICD-10-CM | POA: Diagnosis not present

## 2021-10-29 DIAGNOSIS — D689 Coagulation defect, unspecified: Secondary | ICD-10-CM | POA: Diagnosis not present

## 2021-10-29 DIAGNOSIS — N2581 Secondary hyperparathyroidism of renal origin: Secondary | ICD-10-CM | POA: Diagnosis not present

## 2021-10-29 DIAGNOSIS — D472 Monoclonal gammopathy: Secondary | ICD-10-CM | POA: Diagnosis not present

## 2021-10-29 DIAGNOSIS — N186 End stage renal disease: Secondary | ICD-10-CM | POA: Diagnosis not present

## 2021-10-29 DIAGNOSIS — D509 Iron deficiency anemia, unspecified: Secondary | ICD-10-CM | POA: Diagnosis not present

## 2021-10-29 DIAGNOSIS — Z992 Dependence on renal dialysis: Secondary | ICD-10-CM | POA: Diagnosis not present

## 2021-10-31 DIAGNOSIS — N2581 Secondary hyperparathyroidism of renal origin: Secondary | ICD-10-CM | POA: Diagnosis not present

## 2021-10-31 DIAGNOSIS — D509 Iron deficiency anemia, unspecified: Secondary | ICD-10-CM | POA: Diagnosis not present

## 2021-10-31 DIAGNOSIS — N186 End stage renal disease: Secondary | ICD-10-CM | POA: Diagnosis not present

## 2021-10-31 DIAGNOSIS — D689 Coagulation defect, unspecified: Secondary | ICD-10-CM | POA: Diagnosis not present

## 2021-10-31 DIAGNOSIS — Z992 Dependence on renal dialysis: Secondary | ICD-10-CM | POA: Diagnosis not present

## 2021-10-31 DIAGNOSIS — D472 Monoclonal gammopathy: Secondary | ICD-10-CM | POA: Diagnosis not present

## 2021-11-03 DIAGNOSIS — N2581 Secondary hyperparathyroidism of renal origin: Secondary | ICD-10-CM | POA: Diagnosis not present

## 2021-11-03 DIAGNOSIS — D472 Monoclonal gammopathy: Secondary | ICD-10-CM | POA: Diagnosis not present

## 2021-11-03 DIAGNOSIS — D509 Iron deficiency anemia, unspecified: Secondary | ICD-10-CM | POA: Diagnosis not present

## 2021-11-03 DIAGNOSIS — N186 End stage renal disease: Secondary | ICD-10-CM | POA: Diagnosis not present

## 2021-11-03 DIAGNOSIS — Z992 Dependence on renal dialysis: Secondary | ICD-10-CM | POA: Diagnosis not present

## 2021-11-03 DIAGNOSIS — D689 Coagulation defect, unspecified: Secondary | ICD-10-CM | POA: Diagnosis not present

## 2021-11-05 DIAGNOSIS — N186 End stage renal disease: Secondary | ICD-10-CM | POA: Diagnosis not present

## 2021-11-05 DIAGNOSIS — N2581 Secondary hyperparathyroidism of renal origin: Secondary | ICD-10-CM | POA: Diagnosis not present

## 2021-11-05 DIAGNOSIS — D472 Monoclonal gammopathy: Secondary | ICD-10-CM | POA: Diagnosis not present

## 2021-11-05 DIAGNOSIS — Z992 Dependence on renal dialysis: Secondary | ICD-10-CM | POA: Diagnosis not present

## 2021-11-05 DIAGNOSIS — D509 Iron deficiency anemia, unspecified: Secondary | ICD-10-CM | POA: Diagnosis not present

## 2021-11-05 DIAGNOSIS — D689 Coagulation defect, unspecified: Secondary | ICD-10-CM | POA: Diagnosis not present

## 2021-11-07 DIAGNOSIS — N2581 Secondary hyperparathyroidism of renal origin: Secondary | ICD-10-CM | POA: Diagnosis not present

## 2021-11-07 DIAGNOSIS — D689 Coagulation defect, unspecified: Secondary | ICD-10-CM | POA: Diagnosis not present

## 2021-11-07 DIAGNOSIS — N186 End stage renal disease: Secondary | ICD-10-CM | POA: Diagnosis not present

## 2021-11-07 DIAGNOSIS — D472 Monoclonal gammopathy: Secondary | ICD-10-CM | POA: Diagnosis not present

## 2021-11-07 DIAGNOSIS — Z992 Dependence on renal dialysis: Secondary | ICD-10-CM | POA: Diagnosis not present

## 2021-11-07 DIAGNOSIS — D509 Iron deficiency anemia, unspecified: Secondary | ICD-10-CM | POA: Diagnosis not present

## 2021-11-12 DIAGNOSIS — N2581 Secondary hyperparathyroidism of renal origin: Secondary | ICD-10-CM | POA: Diagnosis not present

## 2021-11-12 DIAGNOSIS — D509 Iron deficiency anemia, unspecified: Secondary | ICD-10-CM | POA: Diagnosis not present

## 2021-11-12 DIAGNOSIS — N186 End stage renal disease: Secondary | ICD-10-CM | POA: Diagnosis not present

## 2021-11-12 DIAGNOSIS — D689 Coagulation defect, unspecified: Secondary | ICD-10-CM | POA: Diagnosis not present

## 2021-11-12 DIAGNOSIS — Z992 Dependence on renal dialysis: Secondary | ICD-10-CM | POA: Diagnosis not present

## 2021-11-12 DIAGNOSIS — D472 Monoclonal gammopathy: Secondary | ICD-10-CM | POA: Diagnosis not present

## 2021-11-14 DIAGNOSIS — D689 Coagulation defect, unspecified: Secondary | ICD-10-CM | POA: Diagnosis not present

## 2021-11-14 DIAGNOSIS — Z992 Dependence on renal dialysis: Secondary | ICD-10-CM | POA: Diagnosis not present

## 2021-11-14 DIAGNOSIS — D509 Iron deficiency anemia, unspecified: Secondary | ICD-10-CM | POA: Diagnosis not present

## 2021-11-14 DIAGNOSIS — N186 End stage renal disease: Secondary | ICD-10-CM | POA: Diagnosis not present

## 2021-11-14 DIAGNOSIS — D472 Monoclonal gammopathy: Secondary | ICD-10-CM | POA: Diagnosis not present

## 2021-11-14 DIAGNOSIS — N2581 Secondary hyperparathyroidism of renal origin: Secondary | ICD-10-CM | POA: Diagnosis not present

## 2021-11-17 DIAGNOSIS — D689 Coagulation defect, unspecified: Secondary | ICD-10-CM | POA: Diagnosis not present

## 2021-11-17 DIAGNOSIS — Z992 Dependence on renal dialysis: Secondary | ICD-10-CM | POA: Diagnosis not present

## 2021-11-17 DIAGNOSIS — N186 End stage renal disease: Secondary | ICD-10-CM | POA: Diagnosis not present

## 2021-11-17 DIAGNOSIS — D509 Iron deficiency anemia, unspecified: Secondary | ICD-10-CM | POA: Diagnosis not present

## 2021-11-17 DIAGNOSIS — D472 Monoclonal gammopathy: Secondary | ICD-10-CM | POA: Diagnosis not present

## 2021-11-17 DIAGNOSIS — N2581 Secondary hyperparathyroidism of renal origin: Secondary | ICD-10-CM | POA: Diagnosis not present

## 2021-11-19 DIAGNOSIS — N186 End stage renal disease: Secondary | ICD-10-CM | POA: Diagnosis not present

## 2021-11-19 DIAGNOSIS — D509 Iron deficiency anemia, unspecified: Secondary | ICD-10-CM | POA: Diagnosis not present

## 2021-11-19 DIAGNOSIS — D689 Coagulation defect, unspecified: Secondary | ICD-10-CM | POA: Diagnosis not present

## 2021-11-19 DIAGNOSIS — Z992 Dependence on renal dialysis: Secondary | ICD-10-CM | POA: Diagnosis not present

## 2021-11-19 DIAGNOSIS — D472 Monoclonal gammopathy: Secondary | ICD-10-CM | POA: Diagnosis not present

## 2021-11-19 DIAGNOSIS — N2581 Secondary hyperparathyroidism of renal origin: Secondary | ICD-10-CM | POA: Diagnosis not present

## 2021-11-21 DIAGNOSIS — D472 Monoclonal gammopathy: Secondary | ICD-10-CM | POA: Diagnosis not present

## 2021-11-21 DIAGNOSIS — N186 End stage renal disease: Secondary | ICD-10-CM | POA: Diagnosis not present

## 2021-11-21 DIAGNOSIS — D509 Iron deficiency anemia, unspecified: Secondary | ICD-10-CM | POA: Diagnosis not present

## 2021-11-21 DIAGNOSIS — D689 Coagulation defect, unspecified: Secondary | ICD-10-CM | POA: Diagnosis not present

## 2021-11-21 DIAGNOSIS — N2581 Secondary hyperparathyroidism of renal origin: Secondary | ICD-10-CM | POA: Diagnosis not present

## 2021-11-21 DIAGNOSIS — Z992 Dependence on renal dialysis: Secondary | ICD-10-CM | POA: Diagnosis not present

## 2021-11-23 DIAGNOSIS — N186 End stage renal disease: Secondary | ICD-10-CM | POA: Diagnosis not present

## 2021-11-23 DIAGNOSIS — I129 Hypertensive chronic kidney disease with stage 1 through stage 4 chronic kidney disease, or unspecified chronic kidney disease: Secondary | ICD-10-CM | POA: Diagnosis not present

## 2021-11-23 DIAGNOSIS — Z992 Dependence on renal dialysis: Secondary | ICD-10-CM | POA: Diagnosis not present

## 2022-01-20 ENCOUNTER — Emergency Department (HOSPITAL_COMMUNITY)
Admission: EM | Admit: 2022-01-20 | Discharge: 2022-01-20 | Disposition: A | Discharge status: Home / Self Care | Payer: Medicare Other | Attending: Emergency Medicine | Admitting: Emergency Medicine

## 2022-01-20 ENCOUNTER — Other Ambulatory Visit: Payer: Self-pay

## 2022-01-20 ENCOUNTER — Emergency Department (HOSPITAL_COMMUNITY): Payer: Medicare Other

## 2022-01-20 ENCOUNTER — Encounter (HOSPITAL_COMMUNITY): Payer: Self-pay | Admitting: Emergency Medicine

## 2022-01-20 DIAGNOSIS — W06XXXA Fall from bed, initial encounter: Secondary | ICD-10-CM | POA: Diagnosis not present

## 2022-01-20 DIAGNOSIS — J029 Acute pharyngitis, unspecified: Secondary | ICD-10-CM | POA: Diagnosis present

## 2022-01-20 DIAGNOSIS — Z992 Dependence on renal dialysis: Secondary | ICD-10-CM | POA: Insufficient documentation

## 2022-01-20 DIAGNOSIS — I5041 Acute combined systolic (congestive) and diastolic (congestive) heart failure: Secondary | ICD-10-CM | POA: Diagnosis not present

## 2022-01-20 DIAGNOSIS — N179 Acute kidney failure, unspecified: Secondary | ICD-10-CM | POA: Diagnosis not present

## 2022-01-20 DIAGNOSIS — I110 Hypertensive heart disease with heart failure: Secondary | ICD-10-CM | POA: Insufficient documentation

## 2022-01-20 DIAGNOSIS — Z8546 Personal history of malignant neoplasm of prostate: Secondary | ICD-10-CM | POA: Diagnosis not present

## 2022-01-20 DIAGNOSIS — D72829 Elevated white blood cell count, unspecified: Secondary | ICD-10-CM | POA: Diagnosis not present

## 2022-01-20 DIAGNOSIS — W19XXXA Unspecified fall, initial encounter: Secondary | ICD-10-CM

## 2022-01-20 DIAGNOSIS — U071 COVID-19: Secondary | ICD-10-CM | POA: Diagnosis not present

## 2022-01-20 LAB — CBC WITH DIFFERENTIAL/PLATELET
Abs Immature Granulocytes: 0.08 10*3/uL — ABNORMAL HIGH (ref 0.00–0.07)
Basophils Absolute: 0 10*3/uL (ref 0.0–0.1)
Basophils Relative: 0 %
Eosinophils Absolute: 0 10*3/uL (ref 0.0–0.5)
Eosinophils Relative: 0 %
HCT: 35 % — ABNORMAL LOW (ref 39.0–52.0)
Hemoglobin: 11.5 g/dL — ABNORMAL LOW (ref 13.0–17.0)
Immature Granulocytes: 1 %
Lymphocytes Relative: 8 %
Lymphs Abs: 1.1 10*3/uL (ref 0.7–4.0)
MCH: 33.3 pg (ref 26.0–34.0)
MCHC: 32.9 g/dL (ref 30.0–36.0)
MCV: 101.4 fL — ABNORMAL HIGH (ref 80.0–100.0)
Monocytes Absolute: 2.7 10*3/uL — ABNORMAL HIGH (ref 0.1–1.0)
Monocytes Relative: 19 %
Neutro Abs: 10.1 10*3/uL — ABNORMAL HIGH (ref 1.7–7.7)
Neutrophils Relative %: 72 %
Platelets: 250 10*3/uL (ref 150–400)
RBC: 3.45 MIL/uL — ABNORMAL LOW (ref 4.22–5.81)
RDW: 13.4 % (ref 11.5–15.5)
WBC: 14 10*3/uL — ABNORMAL HIGH (ref 4.0–10.5)
nRBC: 0 % (ref 0.0–0.2)

## 2022-01-20 LAB — COMPREHENSIVE METABOLIC PANEL
ALT: 24 U/L (ref 0–44)
AST: 110 U/L — ABNORMAL HIGH (ref 15–41)
Albumin: 3.5 g/dL (ref 3.5–5.0)
Alkaline Phosphatase: 88 U/L (ref 38–126)
Anion gap: 14 (ref 5–15)
BUN: 25 mg/dL — ABNORMAL HIGH (ref 8–23)
CO2: 29 mmol/L (ref 22–32)
Calcium: 10 mg/dL (ref 8.9–10.3)
Chloride: 95 mmol/L — ABNORMAL LOW (ref 98–111)
Creatinine, Ser: 4.7 mg/dL — ABNORMAL HIGH (ref 0.61–1.24)
GFR, Estimated: 11 mL/min — ABNORMAL LOW (ref >60)
Glucose, Bld: 124 mg/dL — ABNORMAL HIGH (ref 70–99)
Potassium: 3.9 mmol/L (ref 3.5–5.1)
Sodium: 138 mmol/L (ref 135–145)
Total Bilirubin: 0.9 mg/dL (ref 0.3–1.2)
Total Protein: 6.8 g/dL (ref 6.5–8.1)

## 2022-01-20 LAB — RESP PANEL BY RT-PCR (FLU A&B, COVID) ARPGX2
Influenza A by PCR: NEGATIVE
Influenza B by PCR: NEGATIVE
SARS Coronavirus 2 by RT PCR: POSITIVE — AB

## 2022-01-20 LAB — CK: Total CK: 3637 U/L — ABNORMAL HIGH (ref 49–397)

## 2022-01-20 MED ORDER — SODIUM CHLORIDE 0.9 % IV BOLUS: 500.0000 mL | Freq: Once | INTRAVENOUS | Status: DC

## 2022-01-20 MED ORDER — MOLNUPIRAVIR EUA 200MG CAPSULE: 4.0000 | ORAL_CAPSULE | Freq: Two times a day (BID) | ORAL | 0 refills | Status: AC

## 2022-01-20 MED ORDER — SODIUM CHLORIDE 0.9 % IV BOLUS
250.0000 mL | Freq: Once | INTRAVENOUS | Status: AC
  Administered 2022-01-20: 250 mL via INTRAVENOUS

## 2022-01-20 NOTE — ED Provider Notes (Addendum)
Patient's COVID test is positive.  He has leukocytosis but he frequently seems to have a leukocytosis on multiple prior lab values.  Has expected chronic kidney disease.  He does have a mild to moderate CK elevation which is probably indicative of mild rhabdo.  He was given a small bolus of fluids given his dialysis state as well as CHF.  He denies respiratory symptoms.  He was able to ambulate with a walker which is baseline and did not desaturate or feel short of breath.  No weakness.  He would like to go home which I think is reasonable.  Will discharge with molnupiravir and return precautions.  Follow-up closely with PCP.    Sherwood Gambler, MD 01/20/22 [phone]

## 2022-01-20 NOTE — ED Notes (Signed)
Patient transported to X-ray 

## 2022-01-20 NOTE — ED Notes (Signed)
Pt ambulated with assistance of a walker. Denies feeling SHOB. O2 Sats 94% RA while ambulating.

## 2022-01-20 NOTE — ED Provider Triage Note (Signed)
Emergency Medicine Provider Triage Evaluation Note  Jeffrey Payne , a 88 y.o. male  was evaluated in triage.  Pt complains of flulike symptoms and found on floor.  Patient lives at independent living facility.  He was found by daughter and wife this morning on the floor unable to get up.  His wife was returning home after being admitted from complications of having COVID infection.  He has a cough, sore throat, generalized weakness.  He states that he rolled out of bed but was unable to get himself up off the floor.  He laid on his left side.  He has a skin tear and pain to the left shoulder, skin tear of the left forearm, impression of his glasses and his scalp..  Review of Systems  Positive: Flulike symptoms Negative: Vomiting  Physical Exam  BP 136/60   Pulse 61   Temp 99.5 F (37.5 C) (Oral)   Resp 18   SpO2 93%  Gen:   Awake, no distress   Resp:  Normal effort  MSK:   Moves extremities without difficulty  Other:  Skin tear, cough  Medical Decision Making  Medically screening exam initiated at 5:11 PM.  Appropriate orders placed.  Jeffrey Payne was informed that the remainder of the evaluation will be completed by another provider, this initial triage assessment does not replace that evaluation, and the importance of remaining in the ED until their evaluation is complete.  Work-up initiated   Margarita Mail, PA-C 01/20/22 2218

## 2022-01-20 NOTE — ED Notes (Signed)
Jeffrey Payne, wife, [phone] would like an update on pt when available

## 2022-01-20 NOTE — ED Provider Notes (Signed)
[facility] EMERGENCY DEPARTMENT Provider Note   CSN: [id number] Arrival date & time: 01/20/22  1638     History  Chief Complaint  Patient presents with   Jeffrey Payne is a 88 y.o. male.  Patient presents to the hospital for evaluation after a fall.  Patient transported via EMS from independent living at [facility] where he has been alone.  The patient underwent full dialysis treatment yesterday.  He states he slid of the bed onto the floor this morning before eating breakfast.  He denies hitting his head, denies syncopal episode prior to the fall.  He states he was feeling tired this morning and slipped getting out of bed.  The patient does not take blood thinners.  He complains of some mild left-sided shoulder pain due to laying on the floor for several hours and sore throat.  The patient states that his wife was recently admitted to the hospital due to COVID-19.  The patient currently denies shortness of breath, chest pain, abdominal pain, nausea, vomiting, headache, cough.  EMS reportedly noted a low oxygen saturation level and placed the patient on 2 L/min nasal cannula oxygen.  Past medical history significant for prostate cancer, GERD, renal failure on dialysis, hypertension, arthritis, anemia, bradycardia, hypotension, fatigue, postinflammatory pulmonary fibrosis, multifocal atrial tachycardia, left bundle branch block, acute combined systolic and diastolic congestive heart failure  HPI     Home Medications Prior to Admission medications   Medication Sig Start Date End Date Taking? Authorizing Provider  acetaminophen (TYLENOL) 650 MG CR tablet Take 650 mg by mouth every 8 (eight) hours as needed for pain.    [provider]  cetirizine (ZYRTEC) 10 MG tablet Take 10 mg by mouth daily.    [provider]  methocarbamol (ROBAXIN) 500 MG tablet Take 1 tablet (500 mg total) by mouth in the morning and at bedtime. 03/27/21   Vanetta Mulders, MD  metoprolol tartrate (LOPRESSOR) 25 MG tablet Take 0.5 tablets (12.5 mg total) by mouth 3 (three) times daily. 02/10/21   Nita Sells, MD  midodrine (PROAMATINE) 5 MG tablet Take 1 tablet (5 mg total) by mouth 3 (three) times daily with meals. 02/10/21   Nita Sells, MD  sevelamer carbonate (RENVELA) 800 MG tablet Take 1 tablet (800 mg total) by mouth 3 (three) times daily with meals. 02/10/21   Nita Sells, MD  tamsulosin (FLOMAX) 0.4 MG CAPS capsule Take 1 capsule (0.4 mg total) by mouth daily after supper. Patient taking differently: Take 0.4 mg by mouth in the morning and at bedtime. 10/09/19   Patrecia Pour, MD      Allergies    Patient has no known allergies.    Review of Systems   Review of Systems  Constitutional:  Negative for fever.  Respiratory:  Negative for cough and shortness of breath.   Cardiovascular:  Negative for chest pain.  Gastrointestinal:  Negative for abdominal pain, constipation, diarrhea, nausea and vomiting.  Musculoskeletal:  Positive for arthralgias.  Skin:  Positive for wound.  Neurological:  Negative for syncope and headaches.    Physical Exam Updated Vital Signs BP (!) 136/94   Pulse 87   Temp 99.5 F (37.5 C) (Oral)   Resp 16   Ht '5\' 7"'$  (1.702 m)   Wt 51.4 kg   SpO2 100%   BMI 17.75 kg/m  Physical Exam Vitals and nursing note reviewed.  Constitutional:      General: He is not in  acute distress. HENT:     Head: Normocephalic and atraumatic.     Comments: Small, healing wound noted behind left ear.  Patient states is due to dry skin    Nose: Nose normal.     Mouth/Throat:     Mouth: Mucous membranes are moist.  Eyes:     Conjunctiva/sclera: Conjunctivae normal.  Cardiovascular:     Rate and Rhythm: Normal rate and regular rhythm.     Pulses: Normal pulses.  Pulmonary:     Effort: Pulmonary effort is normal.     Breath sounds: Normal breath sounds.  Abdominal:     Palpations: Abdomen is soft.      Tenderness: There is no abdominal tenderness.  Musculoskeletal:        General: Tenderness (Mild tenderness to palpation of left shoulder) present. Normal range of motion.     Cervical back: Normal range of motion and neck supple.  Skin:    General: Skin is warm and dry.     Findings: Bruising present.     Comments: Skin tear noted to posterior left shoulder, left forearm, anterior left lower leg just distal to patella  Neurological:     Mental Status: He is alert.     Comments: Alert, oriented, thought content appropriate. Speech fluent without evidence of aphasia. Able to follow 2 step commands without difficulty. Normal speed of response.  Cranial Nerves:  II:  Peripheral visual fields grossly normal, pupils, round, reactive to light III,IV, VI: ptosis not present, extra-ocular motions intact bilaterally  V,VII: smile symmetric, facial light touch sensation equal IX,X: midline uvula rise  XI: bilateral shoulder shrug equal XII: midline tongue extension  Motor:  Equal strength in both upper and lower extremities bilaterally Sensory: light touch normal in all extremities.  Cerebellar: normal finger-to-nose with bilateral upper extremities, pronator drift negative Gait: Not assessed     ED Results / Procedures / Treatments   Labs (all labs ordered are listed, but only abnormal results are displayed) Labs Reviewed  RESP PANEL BY RT-PCR (FLU A&B, COVID) ARPGX2  COMPREHENSIVE METABOLIC PANEL  CBC WITH DIFFERENTIAL/PLATELET  CK  URINALYSIS, ROUTINE W REFLEX MICROSCOPIC    EKG None  Radiology No results found.  Procedures Procedures    Medications Ordered in ED Medications - No data to display  ED Course/ Medical Decision Making/ A&P                           Medical Decision Making Amount and/or Complexity of Data Reviewed Labs: ordered.   This patient presents to the ED for concern of left shoulder pain after a fall, this involves an extensive number of  treatment options, and is a complaint that carries with it a high risk of complications and morbidity.  The differential diagnosis includes fracture, dislocation, soft tissue injury, and others   Co morbidities that complicate the patient evaluation  Arthritis   Additional history obtained:  Additional history obtained from EMS External records from outside source obtained and reviewed including notes showing Tuesday Thursday Saturday dialysis   Lab Tests:  I Ordered, and personally interpreted labs.  The pertinent results include: Positive COVID test, white count elevated at 14.0 (appears to be similar to patient's previous visits), hemoglobin 11.5.  Urinalysis, CK, CMP pending   Imaging Studies ordered:  I ordered imaging studies including plain films of the chest and left shoulder I independently visualized and interpreted imaging which showed No acute osseous abnormality identified.  1. Stable left upper lobe calcified granuloma.  2. No acute cardiopulmonary disease.   I agree with the radiologist interpretation   Cardiac Monitoring: / EKG:  The patient was maintained on a cardiac monitor.  I personally viewed and interpreted the cardiac monitored which showed an underlying rhythm of: Sinus tachycardia (rate 102)   Social Determinants of Health:  Patient lives in independent living in a retirement community with his wife   Test / Admission - Considered:  Patient care being transferred to Dr. Verner Chol at shift handoff.  Patient is COVID-positive at this time.  Lab work still pending at this time.  Disposition pending results of lab work and patient's possible new oxygen demand.  If patient has new oxygen demand may need overnight observation and orders for home oxygen therapy        Final Clinical Impression(s) / ED Diagnoses Final diagnoses:  None    Rx / DC Orders ED Discharge Orders     None         Ronny Bacon 01/20/22 Linus Orn, MD 01/20/22 2223

## 2022-01-20 NOTE — ED Notes (Signed)
[facility] Called 

## 2022-01-20 NOTE — ED Triage Notes (Addendum)
Patient BIB [facility] from independent living [place] home where he has been alone, full dialysis treatment yesterday (fistula in right forearm), patient states he slid out of bed onto floor sometimes this morning before eating breakfast. Patient is alert and oriented, complains of sore throat and left arm pain from laying on the arm for several hours. States wife tested positive for COVID-19.

## 2022-01-20 NOTE — Discharge Instructions (Addendum)
Your COVID-19 test was positive.  You are being prescribed an antiviral medicine for this.  You may also take Tylenol for pain or fever.  Be sure to let your dialysis center know that you have COVID but it is also important to go to dialysis tomorrow.  If you develop new or worsening shortness of breath, high fever, vomiting, or any other new/concerning symptoms then return to the ER for evaluation.

## 2022-02-01 ENCOUNTER — Inpatient Hospital Stay (HOSPITAL_COMMUNITY)
Admission: EM | Admit: 2022-02-01 | Discharge: 2022-02-04 | DRG: 177 | Disposition: A | Discharge status: Home / Self Care | Payer: Medicare Other | Attending: Family Medicine | Admitting: Family Medicine

## 2022-02-01 ENCOUNTER — Emergency Department (HOSPITAL_COMMUNITY): Payer: Medicare Other

## 2022-02-01 ENCOUNTER — Encounter (HOSPITAL_COMMUNITY): Payer: Self-pay | Admitting: *Deleted

## 2022-02-01 ENCOUNTER — Other Ambulatory Visit: Payer: Self-pay

## 2022-02-01 DIAGNOSIS — I472 Ventricular tachycardia, unspecified: Secondary | ICD-10-CM | POA: Diagnosis present

## 2022-02-01 DIAGNOSIS — Z96642 Presence of left artificial hip joint: Secondary | ICD-10-CM | POA: Diagnosis present

## 2022-02-01 DIAGNOSIS — U071 COVID-19: Secondary | ICD-10-CM | POA: Diagnosis not present

## 2022-02-01 DIAGNOSIS — Z992 Dependence on renal dialysis: Secondary | ICD-10-CM

## 2022-02-01 DIAGNOSIS — Z8616 Personal history of COVID-19: Secondary | ICD-10-CM | POA: Diagnosis present

## 2022-02-01 DIAGNOSIS — N189 Chronic kidney disease, unspecified: Secondary | ICD-10-CM | POA: Diagnosis present

## 2022-02-01 DIAGNOSIS — Z79899 Other long term (current) drug therapy: Secondary | ICD-10-CM

## 2022-02-01 DIAGNOSIS — N138 Other obstructive and reflux uropathy: Secondary | ICD-10-CM | POA: Diagnosis present

## 2022-02-01 DIAGNOSIS — K219 Gastro-esophageal reflux disease without esophagitis: Secondary | ICD-10-CM | POA: Diagnosis present

## 2022-02-01 DIAGNOSIS — I5042 Chronic combined systolic (congestive) and diastolic (congestive) heart failure: Secondary | ICD-10-CM | POA: Clinically undetermined

## 2022-02-01 DIAGNOSIS — D472 Monoclonal gammopathy: Secondary | ICD-10-CM | POA: Diagnosis present

## 2022-02-01 DIAGNOSIS — M1990 Unspecified osteoarthritis, unspecified site: Secondary | ICD-10-CM | POA: Diagnosis present

## 2022-02-01 DIAGNOSIS — K208 Other esophagitis without bleeding: Secondary | ICD-10-CM | POA: Diagnosis present

## 2022-02-01 DIAGNOSIS — J9601 Acute respiratory failure with hypoxia: Secondary | ICD-10-CM | POA: Diagnosis present

## 2022-02-01 DIAGNOSIS — R0789 Other chest pain: Secondary | ICD-10-CM | POA: Diagnosis not present

## 2022-02-01 DIAGNOSIS — H9190 Unspecified hearing loss, unspecified ear: Secondary | ICD-10-CM | POA: Diagnosis present

## 2022-02-01 DIAGNOSIS — N401 Enlarged prostate with lower urinary tract symptoms: Secondary | ICD-10-CM | POA: Diagnosis present

## 2022-02-01 DIAGNOSIS — I3139 Other pericardial effusion (noninflammatory): Secondary | ICD-10-CM | POA: Diagnosis present

## 2022-02-01 DIAGNOSIS — Z8546 Personal history of malignant neoplasm of prostate: Secondary | ICD-10-CM

## 2022-02-01 DIAGNOSIS — N186 End stage renal disease: Secondary | ICD-10-CM | POA: Diagnosis present

## 2022-02-01 DIAGNOSIS — I480 Paroxysmal atrial fibrillation: Secondary | ICD-10-CM | POA: Diagnosis present

## 2022-02-01 DIAGNOSIS — I10 Essential (primary) hypertension: Secondary | ICD-10-CM | POA: Diagnosis present

## 2022-02-01 DIAGNOSIS — E8889 Other specified metabolic disorders: Secondary | ICD-10-CM | POA: Diagnosis present

## 2022-02-01 DIAGNOSIS — T364X5A Adverse effect of tetracyclines, initial encounter: Secondary | ICD-10-CM | POA: Diagnosis present

## 2022-02-01 DIAGNOSIS — E785 Hyperlipidemia, unspecified: Secondary | ICD-10-CM | POA: Diagnosis present

## 2022-02-01 DIAGNOSIS — D631 Anemia in chronic kidney disease: Secondary | ICD-10-CM | POA: Diagnosis present

## 2022-02-01 DIAGNOSIS — R7989 Other specified abnormal findings of blood chemistry: Secondary | ICD-10-CM | POA: Diagnosis present

## 2022-02-01 DIAGNOSIS — I5043 Acute on chronic combined systolic (congestive) and diastolic (congestive) heart failure: Secondary | ICD-10-CM | POA: Diagnosis present

## 2022-02-01 DIAGNOSIS — J9811 Atelectasis: Secondary | ICD-10-CM | POA: Diagnosis present

## 2022-02-01 DIAGNOSIS — N529 Male erectile dysfunction, unspecified: Secondary | ICD-10-CM | POA: Diagnosis present

## 2022-02-01 DIAGNOSIS — I132 Hypertensive heart and chronic kidney disease with heart failure and with stage 5 chronic kidney disease, or end stage renal disease: Secondary | ICD-10-CM | POA: Diagnosis present

## 2022-02-01 DIAGNOSIS — I440 Atrioventricular block, first degree: Secondary | ICD-10-CM | POA: Diagnosis present

## 2022-02-01 DIAGNOSIS — Z9181 History of falling: Secondary | ICD-10-CM

## 2022-02-01 DIAGNOSIS — J208 Acute bronchitis due to other specified organisms: Secondary | ICD-10-CM | POA: Diagnosis present

## 2022-02-01 LAB — CBC
HCT: 31.3 % — ABNORMAL LOW (ref 39.0–52.0)
Hemoglobin: 10.4 g/dL — ABNORMAL LOW (ref 13.0–17.0)
MCH: 32.9 pg (ref 26.0–34.0)
MCHC: 33.2 g/dL (ref 30.0–36.0)
MCV: 99.1 fL (ref 80.0–100.0)
Platelets: 354 10*3/uL (ref 150–400)
RBC: 3.16 MIL/uL — ABNORMAL LOW (ref 4.22–5.81)
RDW: 13.4 % (ref 11.5–15.5)
WBC: 9.7 10*3/uL (ref 4.0–10.5)
nRBC: 0 % (ref 0.0–0.2)

## 2022-02-01 LAB — BASIC METABOLIC PANEL
Anion gap: 16 — ABNORMAL HIGH (ref 5–15)
BUN: 32 mg/dL — ABNORMAL HIGH (ref 8–23)
CO2: 25 mmol/L (ref 22–32)
Calcium: 9.1 mg/dL (ref 8.9–10.3)
Chloride: 93 mmol/L — ABNORMAL LOW (ref 98–111)
Creatinine, Ser: 5.26 mg/dL — ABNORMAL HIGH (ref 0.61–1.24)
GFR, Estimated: 10 mL/min — ABNORMAL LOW (ref >60)
Glucose, Bld: 92 mg/dL (ref 70–99)
Potassium: 3.6 mmol/L (ref 3.5–5.1)
Sodium: 134 mmol/L — ABNORMAL LOW (ref 135–145)

## 2022-02-01 LAB — TROPONIN I (HIGH SENSITIVITY): Troponin I (High Sensitivity): 41 ng/L — ABNORMAL HIGH (ref <18)

## 2022-02-01 NOTE — ED Notes (Addendum)
Called by sort staff, pt saturations were 88% on room air, pt sitting eyes closed in wheel chair. Brought back to triage for reassessment, says that he was feeling better after he got meds from EMS, but now starting to have SOB and return of his chest pain. Will, PA notified. Saturations 91% on RA on assessment, alert, troponin being redrawn, will obtain additional EKG.

## 2022-02-01 NOTE — ED Provider Triage Note (Signed)
Emergency Medicine Provider Triage Evaluation Note  Jeffrey Payne , a 88 y.o. male  was evaluated in triage.  Pt complains of "fullness in his chest" starting this morning. Symptoms persisted even after taking a nap. Felt as though he was having trouble swallowing. Tightness improved after receiving medications by EMS, got aspirin and nitro. Last dialysis on Saturday, received full treatment.   Review of Systems  Positive: Chest pain, SOB Negative: Syncope, cough  Physical Exam  BP (!) 137/45   Pulse 79   Temp 97.8 F (36.6 C) (Oral)   Resp (!) 36   SpO2 95%  Gen:   Awake, no distress   Resp:  Normal effort  MSK:   Moves extremities without difficulty  Other:  No leg swelling  Medical Decision Making  Medically screening exam initiated at 9:46 PM.  Appropriate orders placed.  Jeffrey Payne was informed that the remainder of the evaluation will be completed by another provider, this initial triage assessment does not replace that evaluation, and the importance of remaining in the ED until their evaluation is complete.  Workup initiated   Jeffrey Plummer, PA-C 02/01/22 2148

## 2022-02-01 NOTE — ED Triage Notes (Signed)
Pt arrives via [facility] from independent living facility, per medic, cp started this morning around 0700, "feels full" dialysis t th sat, has been compliant. 18 in the left ac, 324 asa, 1 nitro. 124/80, nsr with pac 70's, 94%ra. Cbg 148.

## 2022-02-01 NOTE — ED Triage Notes (Signed)
Pt describes a "full feeling, like I could not swallow, and had trouble feeling" while watching TV tonight. Stating  that the tightness has improved since receiving medications by EMS. Last dialysis Saturday. Pt is HOH.

## 2022-02-02 ENCOUNTER — Encounter (HOSPITAL_COMMUNITY): Payer: Self-pay | Admitting: Internal Medicine

## 2022-02-02 ENCOUNTER — Inpatient Hospital Stay (HOSPITAL_COMMUNITY): Payer: Medicare Other

## 2022-02-02 DIAGNOSIS — N185 Chronic kidney disease, stage 5: Secondary | ICD-10-CM

## 2022-02-02 DIAGNOSIS — I440 Atrioventricular block, first degree: Secondary | ICD-10-CM | POA: Diagnosis present

## 2022-02-02 DIAGNOSIS — K208 Other esophagitis without bleeding: Secondary | ICD-10-CM | POA: Diagnosis present

## 2022-02-02 DIAGNOSIS — J9601 Acute respiratory failure with hypoxia: Secondary | ICD-10-CM | POA: Diagnosis present

## 2022-02-02 DIAGNOSIS — J208 Acute bronchitis due to other specified organisms: Secondary | ICD-10-CM | POA: Diagnosis present

## 2022-02-02 DIAGNOSIS — E8889 Other specified metabolic disorders: Secondary | ICD-10-CM | POA: Diagnosis present

## 2022-02-02 DIAGNOSIS — Z992 Dependence on renal dialysis: Secondary | ICD-10-CM

## 2022-02-02 DIAGNOSIS — I5043 Acute on chronic combined systolic (congestive) and diastolic (congestive) heart failure: Secondary | ICD-10-CM

## 2022-02-02 DIAGNOSIS — I472 Ventricular tachycardia, unspecified: Secondary | ICD-10-CM | POA: Diagnosis present

## 2022-02-02 DIAGNOSIS — K219 Gastro-esophageal reflux disease without esophagitis: Secondary | ICD-10-CM | POA: Diagnosis present

## 2022-02-02 DIAGNOSIS — H9190 Unspecified hearing loss, unspecified ear: Secondary | ICD-10-CM | POA: Diagnosis present

## 2022-02-02 DIAGNOSIS — I132 Hypertensive heart and chronic kidney disease with heart failure and with stage 5 chronic kidney disease, or end stage renal disease: Secondary | ICD-10-CM | POA: Diagnosis present

## 2022-02-02 DIAGNOSIS — Z8616 Personal history of COVID-19: Secondary | ICD-10-CM

## 2022-02-02 DIAGNOSIS — Z96642 Presence of left artificial hip joint: Secondary | ICD-10-CM | POA: Diagnosis present

## 2022-02-02 DIAGNOSIS — R7989 Other specified abnormal findings of blood chemistry: Secondary | ICD-10-CM

## 2022-02-02 DIAGNOSIS — N138 Other obstructive and reflux uropathy: Secondary | ICD-10-CM

## 2022-02-02 DIAGNOSIS — T364X5A Adverse effect of tetracyclines, initial encounter: Secondary | ICD-10-CM | POA: Diagnosis present

## 2022-02-02 DIAGNOSIS — I10 Essential (primary) hypertension: Secondary | ICD-10-CM

## 2022-02-02 DIAGNOSIS — D472 Monoclonal gammopathy: Secondary | ICD-10-CM | POA: Diagnosis present

## 2022-02-02 DIAGNOSIS — D631 Anemia in chronic kidney disease: Secondary | ICD-10-CM

## 2022-02-02 DIAGNOSIS — Z8546 Personal history of malignant neoplasm of prostate: Secondary | ICD-10-CM | POA: Diagnosis not present

## 2022-02-02 DIAGNOSIS — U071 COVID-19: Secondary | ICD-10-CM | POA: Diagnosis present

## 2022-02-02 DIAGNOSIS — N401 Enlarged prostate with lower urinary tract symptoms: Secondary | ICD-10-CM

## 2022-02-02 DIAGNOSIS — N186 End stage renal disease: Secondary | ICD-10-CM

## 2022-02-02 DIAGNOSIS — J9811 Atelectasis: Secondary | ICD-10-CM | POA: Diagnosis present

## 2022-02-02 DIAGNOSIS — I480 Paroxysmal atrial fibrillation: Secondary | ICD-10-CM | POA: Diagnosis present

## 2022-02-02 DIAGNOSIS — R0789 Other chest pain: Secondary | ICD-10-CM | POA: Diagnosis present

## 2022-02-02 DIAGNOSIS — I3139 Other pericardial effusion (noninflammatory): Secondary | ICD-10-CM | POA: Diagnosis present

## 2022-02-02 DIAGNOSIS — E785 Hyperlipidemia, unspecified: Secondary | ICD-10-CM | POA: Diagnosis present

## 2022-02-02 HISTORY — DX: Personal history of COVID-19: Z86.16

## 2022-02-02 HISTORY — DX: Other specified abnormal findings of blood chemistry: R79.89

## 2022-02-02 LAB — HEPATITIS B CORE ANTIBODY, TOTAL: Hep B Core Total Ab: NONREACTIVE

## 2022-02-02 LAB — BRAIN NATRIURETIC PEPTIDE: B Natriuretic Peptide: 4356.5 pg/mL — ABNORMAL HIGH (ref 0.0–100.0)

## 2022-02-02 LAB — HEPATITIS B SURFACE ANTIGEN: Hepatitis B Surface Ag: NONREACTIVE

## 2022-02-02 LAB — HEPATITIS B SURFACE ANTIBODY,QUALITATIVE: Hep B S Ab: NONREACTIVE

## 2022-02-02 LAB — TROPONIN I (HIGH SENSITIVITY): Troponin I (High Sensitivity): 38 ng/L — ABNORMAL HIGH (ref <18)

## 2022-02-02 LAB — PROCALCITONIN: Procalcitonin: 5.52 ng/mL

## 2022-02-02 LAB — HEPATITIS C ANTIBODY: HCV Ab: NONREACTIVE

## 2022-02-02 MED ORDER — CHLORHEXIDINE GLUCONATE CLOTH 2 % EX PADS
6.0000 | MEDICATED_PAD | Freq: Every day | CUTANEOUS | Status: DC
  Administered 2022-02-03: 6 via TOPICAL

## 2022-02-02 MED ORDER — METOPROLOL TARTRATE 12.5 MG HALF TABLET
12.5000 mg | ORAL_TABLET | Freq: Three times a day (TID) | ORAL | Status: DC
  Administered 2022-02-02 – 2022-02-04 (×6): 12.5 mg via ORAL
  Filled 2022-02-02 (×6): qty 1

## 2022-02-02 MED ORDER — ACETAMINOPHEN 650 MG RE SUPP: 650.0000 mg | Freq: Four times a day (QID) | RECTAL | Status: DC | PRN

## 2022-02-02 MED ORDER — TAMSULOSIN HCL 0.4 MG PO CAPS
0.4000 mg | ORAL_CAPSULE | Freq: Every day | ORAL | Status: DC
  Administered 2022-02-02 – 2022-02-03 (×2): 0.4 mg via ORAL
  Filled 2022-02-02 (×2): qty 1

## 2022-02-02 MED ORDER — HEPARIN SODIUM (PORCINE) 5000 UNIT/ML IJ SOLN
5000.0000 [IU] | Freq: Three times a day (TID) | INTRAMUSCULAR | Status: DC
  Administered 2022-02-02 – 2022-02-04 (×5): 5000 [IU] via SUBCUTANEOUS
  Filled 2022-02-02 (×5): qty 1

## 2022-02-02 MED ORDER — BOOST PO LIQD: 237.0000 mL | Freq: Every day | ORAL | Status: DC | PRN

## 2022-02-02 MED ORDER — SODIUM CHLORIDE 0.9 % IV SOLN
2.0000 g | INTRAVENOUS | Status: DC
  Administered 2022-02-03: 2 g via INTRAVENOUS
  Filled 2022-02-02: qty 20

## 2022-02-02 MED ORDER — ALBUTEROL SULFATE (2.5 MG/3ML) 0.083% IN NEBU: 2.5000 mg | INHALATION_SOLUTION | RESPIRATORY_TRACT | Status: DC | PRN

## 2022-02-02 MED ORDER — CALCIUM ACETATE (PHOS BINDER) 667 MG PO CAPS
667.0000 mg | ORAL_CAPSULE | Freq: Three times a day (TID) | ORAL | Status: DC
  Administered 2022-02-03 (×3): 667 mg via ORAL
  Filled 2022-02-02 (×4): qty 1

## 2022-02-02 MED ORDER — FLUTICASONE PROPIONATE 50 MCG/ACT NA SUSP: 2.0000 | Freq: Every day | NASAL | Status: DC | PRN

## 2022-02-02 MED ORDER — ACETAMINOPHEN 325 MG PO TABS
650.0000 mg | ORAL_TABLET | Freq: Four times a day (QID) | ORAL | Status: DC | PRN
  Administered 2022-02-02 (×2): 650 mg via ORAL
  Filled 2022-02-02 (×2): qty 2

## 2022-02-02 MED ORDER — ALUM & MAG HYDROXIDE-SIMETH 200-200-20 MG/5ML PO SUSP
30.0000 mL | Freq: Four times a day (QID) | ORAL | Status: DC | PRN
  Administered 2022-02-02: 30 mL via ORAL
  Filled 2022-02-02: qty 30

## 2022-02-02 MED ORDER — LORATADINE 10 MG PO TABS
10.0000 mg | ORAL_TABLET | Freq: Every day | ORAL | Status: DC
  Administered 2022-02-02 – 2022-02-04 (×3): 10 mg via ORAL
  Filled 2022-02-02 (×3): qty 1

## 2022-02-02 MED ORDER — CALCIUM ACETATE (PHOS BINDER) 667 MG PO TABS: 667.0000 mg | ORAL_TABLET | Freq: Three times a day (TID) | ORAL | Status: DC

## 2022-02-02 MED ORDER — CARBOXYMETHYLCELLULOSE SODIUM 0.5 % OP SOLN: 1.0000 [drp] | Freq: Every day | OPHTHALMIC | Status: DC | PRN

## 2022-02-02 MED ORDER — IOHEXOL 350 MG/ML SOLN
40.0000 mL | Freq: Once | INTRAVENOUS | Status: AC | PRN
  Administered 2022-02-02: 40 mL via INTRAVENOUS

## 2022-02-02 MED ORDER — SODIUM CHLORIDE 0.9% FLUSH
3.0000 mL | Freq: Two times a day (BID) | INTRAVENOUS | Status: DC
  Administered 2022-02-02 – 2022-02-03 (×4): 3 mL via INTRAVENOUS

## 2022-02-02 MED ORDER — GUAIFENESIN ER 600 MG PO TB12
600.0000 mg | ORAL_TABLET | Freq: Two times a day (BID) | ORAL | Status: DC
  Administered 2022-02-02 – 2022-02-04 (×5): 600 mg via ORAL
  Filled 2022-02-02 (×5): qty 1

## 2022-02-02 MED ORDER — METHOCARBAMOL 500 MG PO TABS
500.0000 mg | ORAL_TABLET | Freq: Three times a day (TID) | ORAL | Status: DC | PRN
  Administered 2022-02-02 (×2): 500 mg via ORAL
  Filled 2022-02-02 (×2): qty 1

## 2022-02-02 NOTE — Progress Notes (Addendum)
  Progress Note   Patient: Dmetrius Ambs DSK:[id number] DOB: 1932/05/13 DOA: 02/01/2022     0 DOS: the patient was seen and examined on 02/02/2022   Following up initial work-up revealed BNP 4356.5 and procalcitonin 5.52.  CT angiogram of the chest PE study noted no evidence of a pulmonary embolus although limited due to respiratory motion artifact, mild bilateral bronchial wall thickening is seen in bronchitis, unchanged small pericardial effusion, trace right pleural effusion, and cardiomegaly with severe left main and three-vessel coronary artery calcifications.  Will add on empiric antibiotics of Rocephin at this time due to elevated procalcitonin and  will monitor daily.  Elevated BNP suggest patient is likely somewhat fluid overloaded as well and will benefit from dialysis today.  Do not feel patient still warrants airborne precautions at this time.  Author: Norval Morton, MD 02/02/2022 2:27 PM  For on call review [email or web address].

## 2022-02-02 NOTE — ED Notes (Signed)
New ekg was obtained, pt placed on 2liters

## 2022-02-02 NOTE — Progress Notes (Signed)
Received patient in bed to unit.  Alert and oriented.  Informed consent signed and in chart.   Treatment initiated: 1505 Treatment completed: 1820  Patient tolerated well.  Transported back to the room  Alert, without acute distress.  Hand-off given to patient's nurse.   Access used: AVF Access issues: none  Total UF removed: 2 L Medication(s) given: Tylenol 650 mg Post HD VS: 150/60 P 70 R 20 Post HD weight: 50 kg   [facility]

## 2022-02-02 NOTE — ED Notes (Signed)
Report given to dialysis.   

## 2022-02-02 NOTE — Consult Note (Signed)
Renal Service Consult Note [facility]  Jeffrey Payne 02/02/2022 [name], MD Requesting Physician: Dr. Tamala Julian, R.   Reason for Consult: ESRD pt w/ SOB HPI: The patient is a 88 y.o. year-old w/ hx of anemia, DJD, prostate cancer, HOH, HTN, HL, esrd on HD TTS who presented to ED for SOB and chest pains. Wife had noticed DOE when going up stairs this past week. Recently dx'd w/ COVID, is not on any home O2 at baseline. No fevers, no hx CHF. In ED BP 145/ 55, HR 60s, RR 18- 25, afebrile. [place] 2L at 99%. CXR showed vasc congestion and atelectasis.  Pt seen in ED. Hx as above. Has not missed any OP HD.   ROS - denies CP, no joint pain, no HA, no blurry vision, no rash, no diarrhea, no nausea/ vomiting, no dysuria, no difficulty voiding   Past Medical History  Past Medical History:  Diagnosis Date   Anemia    Arthritis    knees   Cancer ([place])    prostate   ED (erectile dysfunction)    GERD (gastroesophageal reflux disease)    ocassional    Hard of hearing    Hypertension    Hypertriglyceridemia    Renal failure    Dialysis dependent   Past Surgical History  Past Surgical History:  Procedure Laterality Date   A/V FISTULAGRAM N/A 07/25/2020   Procedure: A/V FISTULAGRAM - Left Arm;  Surgeon: Elam Dutch, MD;  Location: [place] [facility];  Service: Cardiovascular;  Laterality: N/A;   AV FISTULA PLACEMENT Left 03/04/2020   Procedure: BRACHIOCEPHALIC ARTERIOVENOUS (AV) FISTULA CREATION LEFT;  Surgeon: Elam Dutch, MD;  Location: [facility] OR;  Service: Vascular;  Laterality: Left;   AV FISTULA PLACEMENT Right 07/28/2020   Procedure: RIGHT  ARM ARTERIOVENOUS FISTULA CREATION;  Surgeon: Elam Dutch, MD;  Location: [place];  Service: Vascular;  Laterality: Right;   COLONOSCOPY     HIP ARTHROPLASTY Left 01/30/2021   Procedure: ARTHROPLASTY BIPOLAR HIP (HEMIARTHROPLASTY);  Surgeon: Vanetta Mulders, MD;  Location: [place];  Service: Orthopedics;  Laterality: Left;    IR AV DIALY SHUNT INTRO NEEDLE/INTRACATH INITIAL W/PTA/IMG RIGHT Right 02/09/2021   IR FLUORO GUIDE CV LINE RIGHT  02/11/2021   IR US GUIDE VASC ACCESS RIGHT  02/09/2021   IR US GUIDE VASC ACCESS RIGHT  02/11/2021   renal cyst biopsy     TONSILLECTOMY     Family History  Family History  Problem Relation Age of Onset   Cancer Mother    Social History  reports that he has never smoked. He has never used smokeless tobacco. He reports current alcohol use of about 14.0 standard drinks of alcohol per week. He reports that he does not use drugs. Allergies No Known Allergies Home medications Prior to Admission medications   Medication Sig Start Date End Date Taking? Authorizing Provider  acetaminophen (TYLENOL) 650 MG CR tablet Take 650 mg by mouth every 8 (eight) hours as needed for pain.   Yes [provider]  calcium acetate (PHOSLO) 667 MG tablet Take 667 mg by mouth 3 (three) times daily with meals. 12/16/21  Yes [provider]  carboxymethylcellulose (REFRESH PLUS) 0.5 % SOLN Place 1 drop into both eyes daily as needed (dry eyes).   Yes [provider]  cetirizine (ZYRTEC) 10 MG tablet Take 10 mg by mouth daily.   Yes [provider]  doxycycline (VIBRAMYCIN) 100 MG capsule Take 100 mg by mouth 2 (  two) times daily. 01/31/22  Yes [provider]  fluticasone (FLONASE) 50 MCG/ACT nasal spray Place 2 sprays into both nostrils daily as needed for allergies or rhinitis.   Yes [provider]  lactose free nutrition (BOOST) LIQD Take 237 mLs by mouth daily as needed (for nutrition).   Yes [provider]  metoprolol tartrate (LOPRESSOR) 25 MG tablet Take 0.5 tablets (12.5 mg total) by mouth 3 (three) times daily. 02/10/21  Yes Nita Sells, MD  tamsulosin (FLOMAX) 0.4 MG CAPS capsule Take 1 capsule (0.4 mg total) by mouth daily after supper. 10/09/19  Yes Patrecia Pour, MD  sevelamer carbonate (RENVELA) 800 MG tablet Take 1 tablet  (800 mg total) by mouth 3 (three) times daily with meals. Patient not taking: Reported on 02/02/2022 02/10/21   Nita Sells, MD     Vitals:   02/02/22 0816 02/02/22 1015 02/02/22 1030 02/02/22 1045  BP: (!) 140/55 (!) 162/46 (!) 147/41 (!) 148/53  Pulse: 60 64 (!) 53 63  Resp: 19 (!) 22 17 (!) 21  Temp: 97.7 F (36.5 C)     TempSrc: Oral     SpO2: 100% 99% 100% 98%   Exam Gen alert, no distress No jvd or bruits Chest clear bilat to bases RRR no MRG Abd soft ntnd no mass or ascites +bs MS no joint effusions or deformity Ext trace ankle edema Neuro is alert, Ox 3 , nf    RFA AVF+ bruit   Home meds include - calcium acetate 1 ac tid, metoprolol 12.5 bid, tamsulosin, sevelamer carbonate 1 ac tid, prns/ vits/ supps   OP HD: [facility] TTS 3h 3mn  400/1.5  55.5kg   2/2 bath   Hep 4000  RFA AVF - last Hb 10.6 on 10/5 - no esa, vdra, or Fe - last HB 10/7 post wt 55kg   Assessment/ Plan: AHRF - w/ SOB/ DOE, possibly bronchitis or vol overload. Recent COVID infection but was treated w/ molnupiravir and CT w/o signs of infection here.  Recent COVID infection - tested + on 9/27 here ESRD - on HD TTS. HD today, get vol down as tolerated.   BP/vol - BP's are good, trying to get 2-3 L UF w/ HD today. Get wts post HD if at all possible.  Anemia esrd - Hb 10.4, not getting Fe or esa at OP unit MBD ckd - Ca in range, cont binder/ phoslo.     RKelly Splinter MD 02/02/2022, 11:23 AM Recent Labs  Lab 02/01/22 2151  HGB 10.4*  CALCIUM 9.1  CREATININE 5.26*  K 3.6   Inpatient medications:  calcium acetate  667 mg Oral TID WC   Chlorhexidine Gluconate Cloth  6 each Topical Q0600   guaiFENesin  600 mg Oral BID   heparin  5,000 Units Subcutaneous Q8H   loratadine  10 mg Oral Daily   metoprolol tartrate  12.5 mg Oral TID   sodium chloride flush  3 mL Intravenous Q12H   tamsulosin  0.4 mg Oral QPC supper    cefTRIAXone (ROCEPHIN)  IV     acetaminophen **OR** acetaminophen,  albuterol, alum & mag hydroxide-simeth, fluticasone, lactose free nutrition, methocarbamol

## 2022-02-02 NOTE — ED Notes (Signed)
Patient transported to CT 

## 2022-02-02 NOTE — ED Notes (Signed)
Pt going to dialysis

## 2022-02-02 NOTE — ED Provider Notes (Signed)
I saw and evaluated the patient, reviewed the resident's note and I agree with the findings and plan.  EKG Interpretation  Date/Time:  Monday February 01 2022 23:45:58 EDT Ventricular Rate:  79 PR Interval:  204 QRS Duration: 74 QT Interval:  412 QTC Calculation: 472 R Axis:   -40 Text Interpretation: Sinus rhythm with Premature supraventricular complexes Left axis deviation Minimal voltage criteria for LVH, may be normal variant ( R in aVL ) ST & T wave abnormality, consider inferior ischemia Prolonged QT Abnormal ECG When compared with ECG of 20-Jan-2022 18:05, PREVIOUS ECG IS PRESENT Confirmed by Lacretia Leigh ([id number]) on 02/02/2022 10:59:12 AM   This 88 year old male presents with increased shortness of breath as well as epigastric pain.  Patient COVID a week ago.  He has new oxygen requirement here.  X-ray does not show any evidence of infiltrate.  Concern for possible pulmonary embolus but due to chronic kidney disease cannot have chest CT.  Symptoms could also be from post-COVID.  Requires 3 L of oxygen at this time.  Will require admission         Lacretia Leigh, MD 02/02/22 1103

## 2022-02-02 NOTE — ED Provider Notes (Signed)
[facility] EMERGENCY DEPARTMENT Provider Note   CSN: [id number] Arrival date & time: 02/01/22  2049     History  Chief Complaint  Patient presents with   Chest Pain    Jeffrey Payne is a 88 y.o. male.  Patient presents with right side to mid chest pain since yesterday morning. He recently had COVID last week and was given medication at the urgent care. When he tried to take his dose for yesterday morning, he felt like it was getting stuck when he was swallowing and then started to get a sensation of fullness in his chest that has been continuous. Describes the sensation as an dull, aching feeling. Denies radiating pain, numbness, tingling, orthopnea, leg swelling or other pain. Wife has noticed that he has been short of breath recently over the past week since he was diagnosed with COVID, patient endorses some dyspnea with walking up stairs. He does not use any oxygen at baseline. When he first was diagnosed with COVID, he has a dry cough. Denies fever but endorses congestion. Denies history of heart failure.   The history is provided by the patient and the spouse.  Chest Pain Associated symptoms: cough and shortness of breath        Home Medications Prior to Admission medications   Medication Sig Start Date End Date Taking? Authorizing Provider  acetaminophen (TYLENOL) 650 MG CR tablet Take 650 mg by mouth every 8 (eight) hours as needed for pain.   Yes [provider]  cetirizine (ZYRTEC) 10 MG tablet Take 10 mg by mouth daily.   Yes [provider]  metoprolol tartrate (LOPRESSOR) 25 MG tablet Take 0.5 tablets (12.5 mg total) by mouth 3 (three) times daily. 02/10/21  Yes Nita Sells, MD  sevelamer carbonate (RENVELA) 800 MG tablet Take 1 tablet (800 mg total) by mouth 3 (three) times daily with meals. 02/10/21  Yes Nita Sells, MD  tamsulosin (FLOMAX) 0.4 MG CAPS capsule Take 1 capsule (0.4 mg total) by mouth daily after  supper. 10/09/19  Yes Patrecia Pour, MD  calcium acetate (PHOSLO) 667 MG tablet Take 667 mg by mouth 3 (three) times daily with meals. 12/16/21   [provider]  doxycycline (VIBRAMYCIN) 100 MG capsule Take 100 mg by mouth 2 (two) times daily. 01/31/22   [provider]      Allergies    Patient has no known allergies.    Review of Systems   Review of Systems  Respiratory:  Positive for cough and shortness of breath.   Cardiovascular:  Positive for chest pain.  All other systems reviewed and are negative.   Physical Exam Updated Vital Signs BP (!) 148/53   Pulse 63   Temp 97.7 F (36.5 C) (Oral)   Resp (!) 21   SpO2 98%  Physical Exam Vitals reviewed.  Constitutional:      General: He is not in acute distress.    Appearance: He is well-developed. He is not ill-appearing or toxic-appearing.  HENT:     Head: Normocephalic and atraumatic.     Mouth/Throat:     Mouth: Mucous membranes are moist.  Eyes:     General: No scleral icterus.       Right eye: No discharge.        Left eye: No discharge.     Extraocular Movements: Extraocular movements intact.  Neck:     Vascular: No JVD.  Cardiovascular:     Rate and Rhythm: Normal rate and regular  rhythm.     Pulses: Normal pulses.     Heart sounds: Normal heart sounds. No murmur heard.    No gallop.  Pulmonary:     Effort: Pulmonary effort is normal. No respiratory distress.     Breath sounds: Normal breath sounds. No wheezing or rales.  Chest:     Chest wall: Tenderness (on palpation of right upper chest wall) present.  Abdominal:     General: Bowel sounds are normal. There is no distension.     Palpations: Abdomen is soft.     Tenderness: There is no abdominal tenderness. There is no rebound.  Musculoskeletal:        General: No swelling or tenderness. Normal range of motion.     Cervical back: Normal range of motion.     Right lower leg: No edema.     Left lower leg: No edema.  Lymphadenopathy:      Cervical: No cervical adenopathy.  Skin:    General: Skin is warm.     Findings: No erythema or rash.  Neurological:     Mental Status: He is alert.     ED Results / Procedures / Treatments   Labs (all labs ordered are listed, but only abnormal results are displayed) Labs Reviewed  BASIC METABOLIC PANEL - Abnormal; Notable for the following components:      Result Value   Sodium 134 (*)    Chloride 93 (*)    BUN 32 (*)    Creatinine, Ser 5.26 (*)    GFR, Estimated 10 (*)    Anion gap 16 (*)    All other components within normal limits  CBC - Abnormal; Notable for the following components:   RBC 3.16 (*)    Hemoglobin 10.4 (*)    HCT 31.3 (*)    All other components within normal limits  TROPONIN I (HIGH SENSITIVITY) - Abnormal; Notable for the following components:   Troponin I (High Sensitivity) 41 (*)    All other components within normal limits  TROPONIN I (HIGH SENSITIVITY) - Abnormal; Notable for the following components:   Troponin I (High Sensitivity) 38 (*)    All other components within normal limits    EKG EKG Interpretation  Date/Time:  Monday February 01 2022 23:45:58 EDT Ventricular Rate:  79 PR Interval:  204 QRS Duration: 74 QT Interval:  412 QTC Calculation: 472 R Axis:   -40 Text Interpretation: Sinus rhythm with Premature supraventricular complexes Left axis deviation Minimal voltage criteria for LVH, may be normal variant ( R in aVL ) ST & T wave abnormality, consider inferior ischemia Prolonged QT Abnormal ECG When compared with ECG of 20-Jan-2022 18:05, PREVIOUS ECG IS PRESENT Confirmed by Lacretia Leigh ([id number]) on 02/02/2022 10:59:12 AM  Radiology DG Chest 2 View  Result Date: 02/01/2022 CLINICAL DATA:  Chest pain EXAM: CHEST - 2 VIEW COMPARISON:  01/20/2022 FINDINGS: Benign calcified granuloma within the left upper lung zone. Mild bibasilar atelectasis. No confluent pulmonary infiltrate. No pneumothorax or pleural effusion. Cardiac size is  mildly enlarged, unchanged. Central pulmonary arterial enlargement is again noted, better appreciated on CT examination of 10/07/2019. No superimposed overt pulmonary edema. No acute bone abnormality. IMPRESSION: 1. Mild bibasilar atelectasis. 2. Stable cardiomegaly. 3. Central pulmonary arterial enlargement. Electronically Signed   By: Fidela Salisbury M.D.   On: 02/01/2022 22:08    Procedures Procedures  None  Medications Ordered in ED Medications - No data to display  ED Course/ Medical Decision Making/ A&P  Medical Decision Making Amount and/or Complexity of Data Reviewed Labs: ordered. Radiology: ordered.  Risk Decision regarding hospitalization.    Patient presents with right-sided chest pain with sensation of fullness. On exam, patient noted to have normal sinus rhythm with heart rate within normal limits. Also noted to elicit chest wall tenderness upon deep palpation of right chest wall. In the setting of recent COVID, possibly due to costochondritis. Also considered GI etiology as patient's chest pain started with brief dysphagia. No JVD and noted to be euvolemic on exam so low suspicion for new onset heart failure. Patient does not require any oxygen supplementation at baseline but on 3L, unsure if patient hypoxia or placed for comfort. Prior tachypnea noted in the ED although respiratory rate within normal limits for the duration of my evaluation. Trops 41>38 which is reassuringly, slight increase likely secondary to demand ischemia. CXR demonstrates stable cardiomegaly and mild bibasilar atelectasis. No evidence of focal findings or opacity on CXR to suggest pneumonia or other bacterial etiology. Low concern for PE although will see how patient does without oxygen. Upon further evaluation, patient oxygen saturation noted to be high 80s sustained on room air. Hesitant to order CTA to assess for PE as patient is ESRD on HD so will likely require V/Q scan. Given  this hypoxia, patient will likely require admission for acute hypoxic respiratory failure. Discussed with admitting physician who admit patient for ongoing workup.    Final Clinical Impression(s) / ED Diagnoses Final diagnoses:  Acute hypoxic respiratory failure [facility])    Rx / DC Orders ED Discharge Orders     None         Donney Dice, DO 02/02/22 1114    Lacretia Leigh, MD 02/04/22 1348

## 2022-02-02 NOTE — Procedures (Signed)
   I was present at this dialysis session, have reviewed the session itself and made  appropriate changes Kelly Splinter MD [place] pager [phone]   02/02/2022, 3:27 PM

## 2022-02-02 NOTE — H&P (Signed)
History and Physical    Patient: Jeffrey Payne QMG:[id number] DOB: 22-Sep-1932 DOA: 02/01/2022 DOS: the patient was seen and examined on 02/02/2022 PCP: Kristen Loader, FNP  Patient coming from: [place] living states  Chief Complaint:  Chief Complaint  Patient presents with   Chest Pain   HPI: Jeffrey Payne is a 88 y.o. male with medical history significant of HTN, ESRD on HD(TTS), systolic CHF, BPH, and GERD who presents with complaints of chest fullness and discomfort. He had been diagnosed with COVID-19 on 9/27, after presenting to the emergency department after having a fall and was diagnosed with COVID-19 for which he was prescribed molnupiravir.  Patient reported having mild cough that seemed to get better temporarily after completing the molnupiravir.  However, patient reported still having a persistent cough and feeling somewhat short of breath.  He has been going to dialysis as scheduled with his last session on 10/7.Marland Kitchen Patient was seen in urgent care yesterday due to continued cough and congestion symptoms.  He was thought to possibly have bacterial infection for which he was started on doxycycline.  Patient has taken about 2-3 doses of the doxycycline with the last being earlier this morning when he felt fullness in the middle of his chest .  He has not had any fever, calf pain, or leg swelling.  Normally he is not on oxygen at baseline.  Upon admission to the emergency department patient was seen to be afebrile with pulse 53-79, respirations 20-36, blood pressures maintained, and O2 saturations in the upper 80s on room air requiring 3 L of nasal cannula oxygen maintain O2 saturation greater than 92%.  Labs significant for hemoglobin 10.4, sodium 134, BUN 32, creatinine 5.26, and high-sensitivity troponin 41-> 38.  Chest x-ray noted this stable cardiomegaly with mild bibasilar atelectasis and central pulmonary artery enlargement.  Review of Systems: As mentioned in the history of  present illness. All other systems reviewed and are negative. Past Medical History:  Diagnosis Date   Anemia    Arthritis    knees   Cancer [facility])    prostate   ED (erectile dysfunction)    GERD (gastroesophageal reflux disease)    ocassional    Hard of hearing    Hypertension    Hypertriglyceridemia    Renal failure    Dialysis dependent   Past Surgical History:  Procedure Laterality Date   A/V FISTULAGRAM N/A 07/25/2020   Procedure: A/V FISTULAGRAM - Left Arm;  Surgeon: Elam Dutch, MD;  Location: [place] [facility];  Service: Cardiovascular;  Laterality: N/A;   AV FISTULA PLACEMENT Left 03/04/2020   Procedure: BRACHIOCEPHALIC ARTERIOVENOUS (AV) FISTULA CREATION LEFT;  Surgeon: Elam Dutch, MD;  Location: [facility] OR;  Service: Vascular;  Laterality: Left;   AV FISTULA PLACEMENT Right 07/28/2020   Procedure: RIGHT  ARM ARTERIOVENOUS FISTULA CREATION;  Surgeon: Elam Dutch, MD;  Location: [place];  Service: Vascular;  Laterality: Right;   COLONOSCOPY     HIP ARTHROPLASTY Left 01/30/2021   Procedure: ARTHROPLASTY BIPOLAR HIP (HEMIARTHROPLASTY);  Surgeon: Vanetta Mulders, MD;  Location: [place];  Service: Orthopedics;  Laterality: Left;   IR AV DIALY SHUNT INTRO NEEDLE/INTRACATH INITIAL W/PTA/IMG RIGHT Right 02/09/2021   IR FLUORO GUIDE CV LINE RIGHT  02/11/2021   IR US GUIDE VASC ACCESS RIGHT  02/09/2021   IR US GUIDE VASC ACCESS RIGHT  02/11/2021   renal cyst biopsy     TONSILLECTOMY     Social History:  reports that he has  never smoked. He has never used smokeless tobacco. He reports current alcohol use of about 14.0 standard drinks of alcohol per week. He reports that he does not use drugs.  No Known Allergies  Family History  Problem Relation Age of Onset   Cancer Mother     Prior to Admission medications   Medication Sig Start Date End Date Taking? Authorizing Provider  acetaminophen (TYLENOL) 650 MG CR tablet Take 650 mg by mouth every 8 (eight) hours as needed  for pain.   Yes [provider]  calcium acetate (PHOSLO) 667 MG tablet Take 667 mg by mouth 3 (three) times daily with meals. 12/16/21  Yes [provider]  carboxymethylcellulose (REFRESH PLUS) 0.5 % SOLN Place 1 drop into both eyes daily as needed (dry eyes).   Yes [provider]  cetirizine (ZYRTEC) 10 MG tablet Take 10 mg by mouth daily.   Yes [provider]  doxycycline (VIBRAMYCIN) 100 MG capsule Take 100 mg by mouth 2 (two) times daily. 01/31/22  Yes [provider]  fluticasone (FLONASE) 50 MCG/ACT nasal spray Place 2 sprays into both nostrils daily as needed for allergies or rhinitis.   Yes [provider]  lactose free nutrition (BOOST) LIQD Take 237 mLs by mouth daily as needed (for nutrition).   Yes [provider]  metoprolol tartrate (LOPRESSOR) 25 MG tablet Take 0.5 tablets (12.5 mg total) by mouth 3 (three) times daily. 02/10/21  Yes Nita Sells, MD  tamsulosin (FLOMAX) 0.4 MG CAPS capsule Take 1 capsule (0.4 mg total) by mouth daily after supper. 10/09/19  Yes Patrecia Pour, MD  sevelamer carbonate (RENVELA) 800 MG tablet Take 1 tablet (800 mg total) by mouth 3 (three) times daily with meals. Patient not taking: Reported on 02/02/2022 02/10/21   Nita Sells, MD    Physical Exam: Vitals:   02/02/22 0816 02/02/22 1015 02/02/22 1030 02/02/22 1045  BP: (!) 140/55 (!) 162/46 (!) 147/41 (!) 148/53  Pulse: 60 64 (!) 53 63  Resp: 19 (!) 22 17 (!) 21  Temp: 97.7 F (36.5 C)     TempSrc: Oral     SpO2: 100% 99% 100% 98%   Constitutional: Frail elderly male who appears to be in some discomfort Eyes: PERRL, lids and conjunctivae normal ENMT: Mucous membranes are moist.   Neck: normal, supple, mild JVD appreciated Respiratory: Decreased aeration with patient on 2 L nasal cannula oxygen with O2 saturation maintained.  Patient only able to speak in shortened sentences. Cardiovascular:   Regular rhythm with  intermittent extra beats. no significant lower extremity edema. 2+ pedal pulses.  Abdomen: Mild abdominal distention.  Bowel sounds positive.  Musculoskeletal: no clubbing / cyanosis. No joint deformity upper and lower extremities. Good ROM, no contractures. Normal muscle tone.  Skin: no rashes, lesions, ulcers. No induration Neurologic: CN 2-12 grossly intact. Sensation intact, DTR normal. Strength 5/5 in all 4.  Psychiatric: Normal judgment and insight. Alert and oriented x 3. Normal mood.   Data Reviewed:  EKG revealed sinus rhythm with PVCs at 79 bpm with first-degree heart block  Assessment and Plan: Acute respiratory failure with hypoxia Patient presents with complaints of cough and chest discomfort.  He was noted to be hypoxic into the upper 80s on room air placed on 2 L nasal cannula oxygen with improvement in O2 saturation greater than 92%.  Chest x-ray noted mild bibasilar atelectasis with stable cardiomegaly and central pulmonary artery enlargement.  Differential includes pill esophagitis, PE, pneumonia, pulmonary edema,  or possibly related to COVID-19 infection. -Admit to a medical telemetry bed -Continuous pulse oximetry with oxygen maintain O2 saturation greater than 92% -Aspiration precautions with elevation head of bed -Check procalcitonin and BNP -Hold doxycycline as this could cause pill esophagitis which makes be contributing to some of his symptoms -Check CT angiogram of the chest PE protocol given recent COVID-19 infection possibility of pulmonary embolus  Elevated troponin Chronic.  He reported having centralized chest discomfort after taking morning pill doxycycline. High-sensitivity troponin 41->38, but has been elevated in the past. -Continue to monitor telemetry  ESRD on HD Patient normally dialyzes Tuesday, Thursday, and Saturday.  Last hemodialysis session on 10/7.  Patient is due for dialysis today.  Labs noted potassium  within normal limits, BUN 32,  and  creatinine 5.26. -Appreciate nephrology consultative services for need of hemodialysis  Combined systolic and diastolic congestive heart failure Possibly acute on chronic.  Last EF noted to be 35 - 40% with grade 1 diastolic dysfunction back in 01/2021. -Fluid management with chemo dialysis  History of COVID-19 infection Patient was diagnosed with COVID-19 on 9/27 and completed 5-day course of molnupiravir. -Airborne precautions in the acute setting as patient still appears to possibly be symptomatic  Essential hypertension Blood pressures currently maintained. -Continue metoprolol as tolerated  Anemia of chronic kidney disease Hemoglobin 10.4 g/dL which appears lower than prior of 11.5 g/dL, but may be secondary to patient being fluid overloaded. -Recheck CBC tomorrow morning  BPH  -Continue Flomax  GERD -GI cocktail as needed heartburn/indigestion DVT prophylaxis: Lovenox Advance Care Planning:   Code Status: Prior   Consults: Nephrology  Family Communication: Wife updated at bedside  Severity of Illness: The appropriate patient status for this patient is INPATIENT. Inpatient status is judged to be reasonable and necessary in order to provide the required intensity of service to ensure the patient's safety. The patient's presenting symptoms, physical exam findings, and initial radiographic and laboratory data in the context of their chronic comorbidities is felt to place them at high risk for further clinical deterioration. Furthermore, it is not anticipated that the patient will be medically stable for discharge from the hospital within 2 midnights of admission.   * I certify that at the point of admission it is my clinical judgment that the patient will require inpatient hospital care spanning beyond 2 midnights from the point of admission due to high intensity of service, high risk for further deterioration and high frequency of surveillance required.*  Author: Norval Morton, MD 02/02/2022 11:20 AM  For on call review [email or web address].

## 2022-02-03 LAB — C-REACTIVE PROTEIN: CRP: 5.8 mg/dL — ABNORMAL HIGH (ref <1.0)

## 2022-02-03 LAB — RENAL FUNCTION PANEL
Albumin: 2.7 g/dL — ABNORMAL LOW (ref 3.5–5.0)
Anion gap: 13 (ref 5–15)
BUN: 21 mg/dL (ref 8–23)
CO2: 28 mmol/L (ref 22–32)
Calcium: 9 mg/dL (ref 8.9–10.3)
Chloride: 97 mmol/L — ABNORMAL LOW (ref 98–111)
Creatinine, Ser: 4.25 mg/dL — ABNORMAL HIGH (ref 0.61–1.24)
GFR, Estimated: 13 mL/min — ABNORMAL LOW (ref >60)
Glucose, Bld: 85 mg/dL (ref 70–99)
Phosphorus: 3.3 mg/dL (ref 2.5–4.6)
Potassium: 3.4 mmol/L — ABNORMAL LOW (ref 3.5–5.1)
Sodium: 138 mmol/L (ref 135–145)

## 2022-02-03 LAB — RESP PANEL BY RT-PCR (FLU A&B, COVID) ARPGX2
Influenza A by PCR: NEGATIVE
Influenza B by PCR: NEGATIVE
SARS Coronavirus 2 by RT PCR: POSITIVE — AB

## 2022-02-03 MED ORDER — PREDNISONE 20 MG PO TABS
40.0000 mg | ORAL_TABLET | Freq: Every day | ORAL | Status: DC
  Administered 2022-02-03 – 2022-02-04 (×2): 40 mg via ORAL
  Filled 2022-02-03 (×2): qty 2

## 2022-02-03 MED ORDER — IPRATROPIUM-ALBUTEROL 0.5-2.5 (3) MG/3ML IN SOLN: 3.0000 mL | Freq: Four times a day (QID) | RESPIRATORY_TRACT | Status: DC | PRN

## 2022-02-03 MED ORDER — IPRATROPIUM-ALBUTEROL 0.5-2.5 (3) MG/3ML IN SOLN
3.0000 mL | Freq: Three times a day (TID) | RESPIRATORY_TRACT | Status: DC
  Filled 2022-02-03: qty 3

## 2022-02-03 MED ORDER — FAMOTIDINE 20 MG PO TABS
20.0000 mg | ORAL_TABLET | Freq: Every day | ORAL | Status: DC
  Administered 2022-02-03 – 2022-02-04 (×2): 20 mg via ORAL
  Filled 2022-02-03 (×2): qty 1

## 2022-02-03 MED ORDER — CHLORHEXIDINE GLUCONATE CLOTH 2 % EX PADS
6.0000 | MEDICATED_PAD | Freq: Every day | CUTANEOUS | Status: DC
  Administered 2022-02-03: 6 via TOPICAL

## 2022-02-03 MED ORDER — BENZONATATE 100 MG PO CAPS
100.0000 mg | ORAL_CAPSULE | Freq: Three times a day (TID) | ORAL | Status: DC
  Administered 2022-02-03 (×2): 100 mg via ORAL
  Filled 2022-02-03 (×3): qty 1

## 2022-02-03 NOTE — Progress Notes (Signed)
[facility] Progress Note  Subjective: seen in room, no c/o's today, breathing is better. 2 L off last night w HD.   Vitals:   02/03/22 0300 02/03/22 0453 02/03/22 0927 02/03/22 0959  BP: (!) 154/58  (!) 144/49 (!) 132/93  Pulse: 85  62 81  Resp: 19  17   Temp: 98 F (36.7 C)  98.3 F (36.8 C)   TempSrc: Oral     SpO2: 95%  94%   Weight:  53 kg    Height:        Exam: Gen alert, no distress, frail elderly male No jvd or bruits Chest occ rales bilat bases RRR no MRG Abd soft ntnd no mass or ascites +bs MS no joint effusions or deformity Ext trace ankle edema Neuro is alert, Ox 3 , nf    RFA AVF+ bruit    Home meds include - calcium acetate 1 ac tid, metoprolol 12.5 bid, tamsulosin, sevelamer carbonate 1 ac tid, prns/ vits/ supps    OP HD: NW TTS 3h 8mn  400/1.5  55.5kg   2/2 bath   Hep 4000  RFA AVF - last Hb 10.6 on 10/5 - no esa, vdra, or Fe - last HB 10/7 post wt 55kg    CXR 10/09- vasc congestion  Assessment/ Plan: AHRF - w/ SOB/ DOE, possibly bronchitis and/ or vol overload. Recent COVID infection but was treated w/ molnupiravir. CT chests w/o signs of infection here.  Recent COVID infection - tested + on 9/27 here, off precautions ESRD - on HD TTS. HD yest and plan HD tomorrow.  BP/vol - BP's stable, got 2L off w/ HD yest w/o any BP drop. Suspect still has extra vol, will plan 2-3 L goal w/ HD tomorrow and will need to lower dry wt.  Anemia esrd - Hb 10.4, not getting Fe or esa at OP unit MBD ckd - Ca in range, cont binder/ phoslo.    Rob Sandy Haye 02/03/2022, 10:12 AM   Recent Labs  Lab 02/01/22 2151 02/03/22 0718  HGB 10.4*  --   ALBUMIN  --  2.7*  CALCIUM 9.1 9.0  PHOS  --  3.3  CREATININE 5.26* 4.25*  K 3.6 3.4*   No results for input(s): "IRON", "TIBC", "FERRITIN" in the last 168 hours. Inpatient medications:  calcium acetate  667 mg Oral TID WC   Chlorhexidine Gluconate Cloth  6 each Topical Q0600   guaiFENesin  600 mg Oral  BID   heparin  5,000 Units Subcutaneous Q8H   loratadine  10 mg Oral Daily   metoprolol tartrate  12.5 mg Oral TID   sodium chloride flush  3 mL Intravenous Q12H   tamsulosin  0.4 mg Oral QPC supper    cefTRIAXone (ROCEPHIN)  IV     acetaminophen **OR** acetaminophen, albuterol, alum & mag hydroxide-simeth, fluticasone, lactose free nutrition, methocarbamol

## 2022-02-03 NOTE — Progress Notes (Signed)
Pt for possible d/c tomorrow per attending if pt remains stable overnight and in the morning. Contacted attending and nephrology staff regarding possible d/c in the morning so pt can receive out-pt HD at clinic (NW 11:40 arrival for 12:00 chair). This may be a possibility per staff pending when pt is assessed by attending. Contacted pt's wife and spoke to her via phone. She is aware of pt's possible d/c tomorrow am if pt remains stable and agreeable to transport pt to clinic from hospital. Wife mentioned to navigator that pt has mentioned to her about skipping treatment on Thursday. I advised wife that pt would need to discuss that with medical providers. Advised wife that if MD was agreeable to pt receiving treatment at clinic on Friday and Saturday then navigator could check with clinic re: availability at clinic on Friday. Wife voices understanding. Contacted [facility] and spoke to RN to request that pt remain on out-pt schedule for tomorrow. Also contacted inpt HD unit and requested that pt be placed on 2nd shift tomorrow in the event pt stable for d/c in the morning and can receive treatment at out-pt clinic. Will assist as needed.   Melven Sartorius Renal Navigator [phone]

## 2022-02-03 NOTE — Progress Notes (Signed)
  Transition of Care [facility]) Screening Note   Patient Details  Name: Jeffrey Payne Date of Birth: 1932/11/16   Transition of Care [facility]) CM/SW Contact:    Tom-Johnson, Renea Ee, RN Phone Number: 02/03/2022, 3:24 PM  Patient is admitted for ARF with Hypoxia.  Recently diagnosed with Covid on 09/27 and treated with Molnupiravir, returned with worsening cough. Currently on IV abx and on room air.  From home with wife, has three supportive children. Does not drive, wife transports to and from appointments and children assists as well.  Has a cane, walker, shower seat, walk-in shower and grab bars at home. PCP is Kristen Loader, FNP and uses [facility] on [address] [place].   Transition of Care Department [facility]) has reviewed patient and no TOC needs or recommendations have been identified at this time. TOC will continue to monitor patient advancement through interdisciplinary progression rounds. If new patient transition needs arise, please place a TOC consult.

## 2022-02-03 NOTE — Plan of Care (Signed)

## 2022-02-03 NOTE — Progress Notes (Signed)
[facility]                                                                              Jeffrey Payne, is a 88 y.o. male, DOB - Sep 08, 1932, ZPH:[id number] Admit date - 02/01/2022    Outpatient Primary MD for the patient is Kristen Loader, FNP  LOS - 1  days  Chief Complaint  Patient presents with   Chest Pain       Brief summary   Patient is a 88 year old male with HTN, ESRD on HD, TTS, systolic CHF, BPH, GERD presented with chest fullness and discomfort.  He was diagnosed with COVID-19 on 9/27 and was treated with molnupiravir.  Patient reported persistent cough and feeling somewhat short of breath.  Last session for HD on 10/7 (Saturday).  Patient was seen in urgent care on 10/9 due to continued cough and congestion.  He was thought to have possible bacterial infection for which she was placed on doxycycline.  2-3 doses of doxycycline with last on the morning of admission when he felt chest fullness and discomfort.  No fevers or chills.  He is not on O2 at baseline.  In ED, afebrile, respirations 20-36, BP stable, O2 sats in upper 80s on room air, was placed on 3 L O2 via nasal cannula  Hemoglobin 10.4, sodium 134, creatinine 5.26, troponin 41-> T8.  Chest x-ray showed stable cardiomegaly with mild bibasilar atelectasis and central pulmonary artery enlargement.  Assessment & Plan    Principal Problem:   Acute respiratory failure with hypoxia ([place]), acute bronchitis, recent COVID -Not on O2 at baseline, recent COVID, treated with molnupiravir -Hold off on doxycycline, possibly has pill esophagitis, was placed on ceftriaxone -CTA negative for PE, showed mild bronchitis -On 3L O2, wean as tolerated, will place on prednisone 40 mg daily for 5 days -Continue DuoNebs scheduled, albuterol prn, Mucinex, Tessalon, I-S  -CRP 5.8, BNP 4356, underwent HD on 10/10 -Home O2 evaluation prior to discharge  Active Problems:   End stage renal disease on dialysis [facility]) on HD,  TTS -Last session on 10/7, Saturday -Nephrology consultedHD on 10/10    Elevated troponin -Likely due to pulmonary edema, ESRD, underwent HD  Acute on chronic combined systolic and diastolic CHF  ([place]) -Volume management with HD     History of COVID-19 -Recently positive on 9/27 treated with molnupiravir    Hypertension -BP currently stable    Anemia of ESRD -H&H currently stable    BPH with urinary obstruction -Continue Flomax  GERD -Likely due to doxycycline possibly causing pill esophagitis -Placed on Pepcid   Code Status: Full CODE STATUS DVT Prophylaxis:  heparin injection 5,000 Units Start: 02/02/22 1400   Level of Care: Level of care: Telemetry Medical Family Communication: Updated patient  Disposition Plan:      Remains inpatient appropriate: Hopefully DC home in a.m. if continues to improve.  Home O2 evaluation prior to DC   Procedures:  HD Consultants:   Nephrology  Antimicrobials:   Anti-infectives (From admission, onward)    Start     Dose/Rate Route Frequency Ordered Stop   02/02/22 1500  cefTRIAXone (ROCEPHIN) 2  g in sodium chloride 0.9 % 100 mL IVPB        2 g 200 mL/hr over 30 Minutes Intravenous Every 24 hours 02/02/22 1436            Medications  calcium acetate  667 mg Oral TID WC   Chlorhexidine Gluconate Cloth  6 each Topical Q0600   Chlorhexidine Gluconate Cloth  6 each Topical Q0600   guaiFENesin  600 mg Oral BID   heparin  5,000 Units Subcutaneous Q8H   loratadine  10 mg Oral Daily   metoprolol tartrate  12.5 mg Oral TID   sodium chloride flush  3 mL Intravenous Q12H   tamsulosin  0.4 mg Oral QPC supper      Subjective:   Jeffrey Payne was seen and examined today.  On 3 L O2 via [place], states feeling better today, no acute shortness of breath however has not ambulated yet.  No acute chest pain.  No nausea vomiting, dizziness, abdominal pain.  Afebrile  Objective:   Vitals:   02/03/22 0300 02/03/22 0453 02/03/22 0927  02/03/22 0959  BP: (!) 154/58  (!) 144/49 (!) 132/93  Pulse: 85  62 81  Resp: 19  17   Temp: 98 F (36.7 C)  98.3 F (36.8 C)   TempSrc: Oral     SpO2: 95%  94%   Weight:  53 kg    Height:        Intake/Output Summary (Last 24 hours) at 02/03/2022 1143 Last data filed at 02/03/2022 0000 Gross per 24 hour  Intake --  Output 2000 ml  Net -2000 ml     Wt Readings from Last 3 Encounters:  02/03/22 53 kg  01/20/22 51.4 kg  02/11/21 51.4 kg   Physical Exam General: Alert and oriented x 3, NAD, hearing deficit, frail Cardiovascular: S1 S2 clear, RRR.  Respiratory: Diminished BS at bases with bibasilar rales Gastrointestinal: Soft, nontender, nondistended, NBS Ext: trace pedal edema bilaterally Neuro: no new deficits Psych: Normal affect and demeanor    Data Reviewed:  I have personally reviewed following labs    CBC Lab Results  Component Value Date   WBC 9.7 02/01/2022   RBC 3.16 (L) 02/01/2022   HGB 10.4 (L) 02/01/2022   HCT 31.3 (L) 02/01/2022   MCV 99.1 02/01/2022   MCH 32.9 02/01/2022   PLT 354 02/01/2022   MCHC 33.2 02/01/2022   RDW 13.4 02/01/2022   LYMPHSABS 1.1 01/20/2022   MONOABS 2.7 (H) 01/20/2022   EOSABS 0.0 01/20/2022   BASOSABS 0.0 92/02/9416     Last metabolic panel Lab Results  Component Value Date   NA 138 02/03/2022   K 3.4 (L) 02/03/2022   CL 97 (L) 02/03/2022   CO2 28 02/03/2022   BUN 21 02/03/2022   CREATININE 4.25 (H) 02/03/2022   GLUCOSE 85 02/03/2022   GFRNONAA 13 (L) 02/03/2022   GFRAA 29 (L) 10/09/2019   CALCIUM 9.0 02/03/2022   PHOS 3.3 02/03/2022   PROT 6.8 01/20/2022   ALBUMIN 2.7 (L) 02/03/2022   LABGLOB 2.7 08/01/2017   BILITOT 0.9 01/20/2022   ALKPHOS 88 01/20/2022   AST 110 (H) 01/20/2022   ALT 24 01/20/2022   ANIONGAP 13 02/03/2022    CBG (last 3)  No results for input(s): "GLUCAP" in the last 72 hours.    Coagulation Profile: No results for input(s): "INR", "PROTIME" in the last 168  hours.   Radiology Studies: I have personally reviewed the imaging studies  CT  Angio Chest Pulmonary Embolism (PE) W or WO Contrast  Result Date: 02/02/2022 CLINICAL DATA:  Pulmonary embolus suspected  IMPRESSION: 1. No evidence of pulmonary embolus. Evaluation of the segmental and subsegmental pulmonary arteries is limited in the lower lungs due to respiratory motion artifact. 2. Mild bilateral bronchial wall thickening, findings can be seen in the setting of bronchitis. 3. Unchanged small pericardial effusion. 4. Trace right pleural effusion. 5. Cardiomegaly. Severe left main and three-vessel coronary artery calcifications. 6. Aortic Atherosclerosis (ICD10-I70.0). Electronically Signed   By: Yetta Glassman M.D.   On: 02/02/2022 13:35   DG Chest 2 View  Result Date: 02/01/2022 CLINICAL DATA:  Chest pain EXAM: CHEST - 2 VIEW COMPARISON:  01/20/2022 FINDINGS: Benign calcified granuloma within the left upper lung zone. Mild bibasilar atelectasis. No confluent pulmonary infiltrate. No pneumothorax or pleural effusion. Cardiac size is mildly enlarged, unchanged. Central pulmonary arterial enlargement is again noted, better appreciated on CT examination of 10/07/2019. No superimposed overt pulmonary edema. No acute bone abnormality. IMPRESSION: 1. Mild bibasilar atelectasis. 2. Stable cardiomegaly. 3. Central pulmonary arterial enlargement. Electronically Signed   By: Fidela Salisbury M.D.   On: 02/01/2022 22:08       Tyriq Moragne M.D. [facility] 02/03/2022, 11:43 AM  Available via Epic secure chat 7am-7pm After 7 pm, please refer to night coverage provider listed on amion.

## 2022-02-04 LAB — CBC
HCT: 31.7 % — ABNORMAL LOW (ref 39.0–52.0)
Hemoglobin: 10.4 g/dL — ABNORMAL LOW (ref 13.0–17.0)
MCH: 32.6 pg (ref 26.0–34.0)
MCHC: 32.8 g/dL (ref 30.0–36.0)
MCV: 99.4 fL (ref 80.0–100.0)
Platelets: 353 10*3/uL (ref 150–400)
RBC: 3.19 MIL/uL — ABNORMAL LOW (ref 4.22–5.81)
RDW: 13.4 % (ref 11.5–15.5)
WBC: 6.4 10*3/uL (ref 4.0–10.5)
nRBC: 0 % (ref 0.0–0.2)

## 2022-02-04 LAB — RENAL FUNCTION PANEL
Albumin: 2.6 g/dL — ABNORMAL LOW (ref 3.5–5.0)
Anion gap: 15 (ref 5–15)
BUN: 35 mg/dL — ABNORMAL HIGH (ref 8–23)
CO2: 27 mmol/L (ref 22–32)
Calcium: 9.4 mg/dL (ref 8.9–10.3)
Chloride: 93 mmol/L — ABNORMAL LOW (ref 98–111)
Creatinine, Ser: 5.79 mg/dL — ABNORMAL HIGH (ref 0.61–1.24)
GFR, Estimated: 9 mL/min — ABNORMAL LOW (ref >60)
Glucose, Bld: 109 mg/dL — ABNORMAL HIGH (ref 70–99)
Phosphorus: 3.9 mg/dL (ref 2.5–4.6)
Potassium: 3.7 mmol/L (ref 3.5–5.1)
Sodium: 135 mmol/L (ref 135–145)

## 2022-02-04 LAB — HEPATITIS B SURFACE ANTIBODY, QUANTITATIVE: Hep B S AB Quant (Post): <3.1 m[IU]/mL — ABNORMAL LOW (ref >9.9)

## 2022-02-04 LAB — PROCALCITONIN: Procalcitonin: 2.05 ng/mL

## 2022-02-04 MED ORDER — LEVOFLOXACIN 500 MG PO TABS: 500.0000 mg | ORAL_TABLET | ORAL | 0 refills | Status: AC

## 2022-02-04 MED ORDER — HEPARIN SODIUM (PORCINE) 1000 UNIT/ML IJ SOLN
INTRAMUSCULAR | Status: AC
  Filled 2022-02-04: qty 2

## 2022-02-04 MED ORDER — PREDNISONE 20 MG PO TABS: 40.0000 mg | ORAL_TABLET | Freq: Every day | ORAL | 0 refills | Status: AC

## 2022-02-04 MED ORDER — BENZONATATE 100 MG PO CAPS: 100.0000 mg | ORAL_CAPSULE | Freq: Three times a day (TID) | ORAL | 0 refills | Status: DC

## 2022-02-04 MED ORDER — HEPARIN SODIUM (PORCINE) 1000 UNIT/ML DIALYSIS: 2000.0000 [IU] | INTRAMUSCULAR | Status: DC | PRN

## 2022-02-04 MED ORDER — HEPARIN SODIUM (PORCINE) 1000 UNIT/ML DIALYSIS
2000.0000 [IU] | INTRAMUSCULAR | Status: DC | PRN
  Administered 2022-02-04: 2000 [IU] via INTRAVENOUS_CENTRAL

## 2022-02-04 MED ORDER — FAMOTIDINE 20 MG PO TABS: 20.0000 mg | ORAL_TABLET | Freq: Every day | ORAL | 0 refills | Status: DC

## 2022-02-04 NOTE — Discharge Summary (Addendum)
Physician Discharge Summary  Jeffrey Payne [id number] DOB: Mar 14, 1933 DOA: 02/01/2022  PCP: Kristen Loader, FNP  Admit date: 02/01/2022 Discharge date: 02/04/2022  Time spent: 40 minutes  Recommendations for Outpatient Follow-up:  Complete 1 more tablet of Levaquin in the outpatient setting EDW needs to be challenged further by nephrology-should be done in the outpatient setting Recommend Chem-12 CBC in about 1 week's time Does not require precautions-is outside 10 day window of infectivity for coronavirus 19 and could have come off precautions since 10/6  Discharge Diagnoses:  MAIN problem for hospitalization   Volume overload secondary to under dialysis Concern for possible superimposed bacterial pneumonia although less likely ESRD First-degree AV block with paroxysmal A-fib in the past Very hard of hearing Prior prostate cancer/BPH  Please see below for itemized issues addressed in HOpsital- refer to other progress notes for clarity if needed  Discharge Condition: Improved  Diet recommendation: Renal  Filed Weights   02/03/22 0453 02/04/22 0420 02/04/22 0601  Weight: 53 kg 53 kg 53.1 kg    History of present illness:  88 year old Jeffrey Payne independent living resident ESRD TTS left 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 Prostate cancer/BPH HTN HLD Hip fracture 01/2021   ED 9/27 with fall slipped out of bed and laid there for some time-no syncope-wife recently admitted for COVID on evaluation found to have low O2 sat placed on 2 L-had a skin tear to posterior left shoulder forearm left lower leg-found to be COVID-positive-had sinus tach chest x-ray showed no acute cardiopulmonary disease White count 14 hemoglobin 11--- he was discharged back to facility and was treated with molnupiravir and return precautions recommended   10/92/2 cough went back to urgent care and prescribed  doxycycline and then   ED 10/9 with shortness of breath "feeling full" sats 88% requiring 3 L and chest pain-also described a sticking sensation in his throat-- Troponins 41 CXR cardiomegaly mild atelectasis CT chest negative for PE showing bronchitis Patient given ceftriaxone and steroids on admission    Hospital Course:  Acute respiratory failure hypoxia bronchitis [?  ILD from [place]? Was on doxycycline as outpatient had pill esophagitis CT shows bronchitis not on oxygen now and was weaned off-finish 2 more days of prednisone 40 mg White count on admission was 9 do not think this was superimposed bacterial infection 1 more dose of Levaquin renally dosed in the outpatient setting  acute superimposed on chronic systolic diastolic heart failure nephrology nephrology felt patient had extra volume patient was dialyzed during hospital stay  ESRD TTS Patient received dialysis on 10/10 and 10/12 prior to discharge and they will continue to challenge EDW in the outpatient setting  Recently treated COVID-19 Treated well molnupiravir 9/27-date of end of precautions was 10/6 Does not require precautions from my perspective  Reflux-like symptoms?  Pill esophagitis Started on Pepcid-no further symptoms-we will discharge on Tums  Paroxysmal A-fib/MAT in the past In A-fib with PVCs and NSVT-CHADVASC >4 but poor candidate for anticoagulation secondary to prior falls Continue metoprolol 12.5 twice daily    Discharge Exam: Vitals:   02/03/22 2207 02/04/22 0601  BP: (!) 159/58 (!) 132/51  Pulse: 85 67  Resp: 20 16  Temp: 97.6 F (36.4 C) 98.1 F (36.7 C)  SpO2: 96% 92%    Subj on day of d/c   Awake coherent no distress EOMI He is very hard of hearing  General Exam on discharge  Chest seems relatively clear no  wheeze rales rhonchi Abdomen is soft no rebound no guarding ROM is intact moving 4 limbs equally Abdomen is soft No lower extremity edema  Discharge  Instructions   Discharge Instructions     Diet - low sodium heart healthy   Complete by: As directed    Discharge instructions   Complete by: As directed    Please complete the final dose of antibiotics Levaquin that you should take as an outpatient-we feel that you had shortness of breath and respiratory issues more so because you might of had more fluid on you then you did have an infection-because it was not completely sure or clear if you had a bronchitis-we did elect to finish off maybe 2 more days of prednisone and because of your reflux and chest pain like symptoms I have given you a prescription of Pepcid as well You do need to have lab work in about 1 week Would recommend that you follow-up in the outpatient setting with your usual doctor in about 1 week   Increase activity slowly   Complete by: As directed    No wound care   Complete by: As directed       Allergies as of 02/04/2022   No Known Allergies      Medication List     STOP taking these medications    doxycycline 100 MG capsule Commonly known as: VIBRAMYCIN       TAKE these medications    acetaminophen 650 MG CR tablet Commonly known as: TYLENOL Take 650 mg by mouth every 8 (eight) hours as needed for pain.   benzonatate 100 MG capsule Commonly known as: TESSALON Take 1 capsule (100 mg total) by mouth 3 (three) times daily.   calcium acetate 667 MG tablet Commonly known as: PHOSLO Take 667 mg by mouth 3 (three) times daily with meals.   carboxymethylcellulose 0.5 % Soln Commonly known as: REFRESH PLUS Place 1 drop into both eyes daily as needed (dry eyes).   cetirizine 10 MG tablet Commonly known as: ZYRTEC Take 10 mg by mouth daily.   fluticasone 50 MCG/ACT nasal spray Commonly known as: FLONASE Place 2 sprays into both nostrils daily as needed for allergies or rhinitis.   lactose free nutrition Liqd Take 237 mLs by mouth daily as needed (for nutrition).   levofloxacin 500 MG  tablet Commonly known as: Levaquin Take 1 tablet (500 mg total) by mouth every other day for 2 days.   metoprolol tartrate 25 MG tablet Commonly known as: LOPRESSOR Take 0.5 tablets (12.5 mg total) by mouth 3 (three) times daily.   sevelamer carbonate 800 MG tablet Commonly known as: RENVELA Take 1 tablet (800 mg total) by mouth 3 (three) times daily with meals.   tamsulosin 0.4 MG Caps capsule Commonly known as: FLOMAX Take 1 capsule (0.4 mg total) by mouth daily after supper.       No Known Allergies    The results of significant diagnostics from this hospitalization (including imaging, microbiology, ancillary and laboratory) are listed below for reference.    Significant Diagnostic Studies: CT Angio Chest Pulmonary Embolism (PE) W or WO Contrast  Result Date: 02/02/2022 CLINICAL DATA:  Pulmonary embolus suspected EXAM: CT ANGIOGRAPHY CHEST WITH CONTRAST TECHNIQUE: Multidetector CT imaging of the chest was performed using the standard protocol during bolus administration of intravenous contrast. Multiplanar CT image reconstructions and MIPs were obtained to evaluate the vascular anatomy. RADIATION DOSE REDUCTION: This exam was performed according to the departmental dose-optimization program which includes  automated exposure control, adjustment of the mA and/or kV according to patient size and/or use of iterative reconstruction technique. CONTRAST:  29m OMNIPAQUE IOHEXOL 350 MG/ML SOLN COMPARISON:  Chest CT dated January 13th 2021 FINDINGS: Cardiovascular: No evidence of pulmonary embolus. Evaluation of the segmental and subsegmental pulmonary arteries is limited in the lower lungs due to respiratory motion artifact. Cardiomegaly. Severe left main and three-vessel coronary artery calcifications. Unchanged small pericardial effusion. Normal caliber thoracic aorta with moderate atherosclerotic disease. Mediastinum/Nodes: Esophagus and thyroid are unremarkable. No pathologically enlarged  lymph nodes seen in the chest. Lungs/Pleura: Central airways are patent. Mild bilateral bronchial wall thickening. Trace right pleural effusion and bibasilar atelectasis. Bilateral calcified granulomas. No consolidation or pneumothorax. Upper Abdomen: No acute abnormality. Musculoskeletal: No chest wall abnormality. No acute or significant osseous findings. Review of the MIP images confirms the above findings. IMPRESSION: 1. No evidence of pulmonary embolus. Evaluation of the segmental and subsegmental pulmonary arteries is limited in the lower lungs due to respiratory motion artifact. 2. Mild bilateral bronchial wall thickening, findings can be seen in the setting of bronchitis. 3. Unchanged small pericardial effusion. 4. Trace right pleural effusion. 5. Cardiomegaly. Severe left main and three-vessel coronary artery calcifications. 6. Aortic Atherosclerosis (ICD10-I70.0). Electronically Signed   By: LYetta GlassmanM.D.   On: 02/02/2022 13:35   DG Chest 2 View  Result Date: 02/01/2022 CLINICAL DATA:  Chest pain EXAM: CHEST - 2 VIEW COMPARISON:  01/20/2022 FINDINGS: Benign calcified granuloma within the left upper lung zone. Mild bibasilar atelectasis. No confluent pulmonary infiltrate. No pneumothorax or pleural effusion. Cardiac size is mildly enlarged, unchanged. Central pulmonary arterial enlargement is again noted, better appreciated on CT examination of 10/07/2019. No superimposed overt pulmonary edema. No acute bone abnormality. IMPRESSION: 1. Mild bibasilar atelectasis. 2. Stable cardiomegaly. 3. Central pulmonary arterial enlargement. Electronically Signed   By: AFidela SalisburyM.D.   On: 02/01/2022 22:08   DG Shoulder Left  Result Date: 01/20/2022 CLINICAL DATA:  Fall.  Left arm pain. EXAM: LEFT SHOULDER - 2+ VIEW COMPARISON:  None Available. FINDINGS: No acute fracture or dislocation is identified. There is moderate glenohumeral joint space narrowing. A calcified granuloma is noted in the left  upper lung. IMPRESSION: No acute osseous abnormality identified. Electronically Signed   By: ALogan BoresM.D.   On: 01/20/2022 18:54   DG Chest 2 View  Result Date: 01/20/2022 CLINICAL DATA:  Status post fall. EXAM: CHEST - 2 VIEW COMPARISON:  February 04, 2021 FINDINGS: The cardiac silhouette is mildly enlarged and unchanged in size. There is marked severity calcification of the aortic arch. A stable calcified granuloma is seen within the left upper lobe. There is no evidence of an acute infiltrate, pleural effusion or pneumothorax. Multilevel degenerative changes are seen throughout the thoracic spine. IMPRESSION: 1. Stable left upper lobe calcified granuloma. 2. No acute cardiopulmonary disease. Electronically Signed   By: TVirgina NorfolkM.D.   On: 01/20/2022 18:53    Microbiology: Recent Results (from the past 240 hour(s))  Resp Panel by RT-PCR (Flu A&B, Covid) Anterior Nasal Swab     Status: Abnormal   Collection Time: 02/03/22 11:43 AM   Specimen: Anterior Nasal Swab  Result Value Ref Range Status   SARS Coronavirus 2 by RT PCR POSITIVE (A) NEGATIVE Final    Comment: (NOTE) SARS-CoV-2 target nucleic acids are DETECTED.  The SARS-CoV-2 RNA is generally detectable in upper respiratory specimens during the acute phase of infection. Positive results are indicative of the presence of  the identified virus, but do not rule out bacterial infection or co-infection with other pathogens not detected by the test. Clinical correlation with patient history and other diagnostic information is necessary to determine patient infection status. The expected result is Negative.  Fact Sheet for Patients: EntrepreneurPulse.com.au  Fact Sheet for Healthcare Providers: IncredibleEmployment.be  This test is not yet approved or cleared by the [place] FDA and  has been authorized for detection and/or diagnosis of SARS-CoV-2 by FDA under an Emergency Use  Authorization (EUA).  This EUA will remain in effect (meaning this test can be used) for the duration of  the COVID-19 declaration under Section 564(b)(1) of the A ct, 21 U.S.C. section 360bbb-3(b)(1), unless the authorization is terminated or revoked sooner.     Influenza A by PCR NEGATIVE NEGATIVE Final   Influenza B by PCR NEGATIVE NEGATIVE Final    Comment: (NOTE) The Xpert Xpress SARS-CoV-2/FLU/RSV plus assay is intended as an aid in the diagnosis of influenza from Nasopharyngeal swab specimens and should not be used as a sole basis for treatment. Nasal washings and aspirates are unacceptable for Xpert Xpress SARS-CoV-2/FLU/RSV testing.  Fact Sheet for Patients: EntrepreneurPulse.com.au  Fact Sheet for Healthcare Providers: IncredibleEmployment.be  This test is not yet approved or cleared by the [place] FDA and has been authorized for detection and/or diagnosis of SARS-CoV-2 by FDA under an Emergency Use Authorization (EUA). This EUA will remain in effect (meaning this test can be used) for the duration of the COVID-19 declaration under Section 564(b)(1) of the Act, 21 U.S.C. section 360bbb-3(b)(1), unless the authorization is terminated or revoked.  Performed at [facility], [place] [address], [place], [place] [address]      Labs: Basic Metabolic Panel: Recent Labs  Lab 02/01/22 2151 02/03/22 0718 02/04/22 0627  NA 134* 138 135  K 3.6 3.4* 3.7  CL 93* 97* 93*  CO2 '25 28 27  '$ GLUCOSE 92 85 109*  BUN 32* 21 35*  CREATININE 5.26* 4.25* 5.79*  CALCIUM 9.1 9.0 9.4  PHOS  --  3.3 3.9   Liver Function Tests: Recent Labs  Lab 02/03/22 0718 02/04/22 0627  ALBUMIN 2.7* 2.6*   No results for input(s): "LIPASE", "AMYLASE" in the last 168 hours. No results for input(s): "AMMONIA" in the last 168 hours. CBC: Recent Labs  Lab 02/01/22 2151  WBC 9.7  HGB 10.4*  HCT 31.3*  MCV 99.1  PLT 354   Cardiac Enzymes: No  results for input(s): "CKTOTAL", "CKMB", "CKMBINDEX", "TROPONINI" in the last 168 hours. BNP: BNP (last 3 results) Recent Labs    02/01/22 2151  BNP 4,356.5*    ProBNP (last 3 results) No results for input(s): "PROBNP" in the last 8760 hours.  CBG: No results for input(s): "GLUCAP" in the last 168 hours.     Signed:  Nita Sells MD   [facility] 02/04/2022, 8:48 AM

## 2022-02-04 NOTE — Progress Notes (Signed)
[facility] Progress Note   Subjective:    Seen and examined patient at bedside. Patient's wife also at bedside. Patient is currently receiving HD. He denies SOB, CP, and N/V. Tolerating UFG 2.5L. Patient can go home today if he remains stable after today's treatment.  Objective Vitals:   02/04/22 0601 02/04/22 0915 02/04/22 0945 02/04/22 1000  BP: (!) 132/51 (!) 138/47 (!) 152/42 (!) 137/34  Pulse: 67     Resp: 16 20 (!) 22 (!) 22  Temp: 98.1 F (36.7 C) 97.7 F (36.5 C)    TempSrc: Oral Oral    SpO2: 92% 94% 92% 93%  Weight: 53.1 kg     Height:       Physical Exam General: Elderly male; frail; awake/alert; NAD; on RA Heart: S1 and S2; No MRGs Lungs: Clear anteriorly Abdomen: Soft and non-tender Extremities: No LE edema Dialysis Access: R AVF (+) B/T   Filed Weights   02/03/22 0453 02/04/22 0420 02/04/22 0601  Weight: 53 kg 53 kg 53.1 kg    Intake/Output Summary (Last 24 hours) at 02/04/2022 1014 Last data filed at 02/04/2022 0600 Gross per 24 hour  Intake 857.61 ml  Output 0 ml  Net 857.61 ml    Additional Objective Labs: Basic Metabolic Panel: Recent Labs  Lab 02/01/22 2151 02/03/22 0718 02/04/22 0627  NA 134* 138 135  K 3.6 3.4* 3.7  CL 93* 97* 93*  CO2 '25 28 27  '$ GLUCOSE 92 85 109*  BUN 32* 21 35*  CREATININE 5.26* 4.25* 5.79*  CALCIUM 9.1 9.0 9.4  PHOS  --  3.3 3.9   Liver Function Tests: Recent Labs  Lab 02/03/22 0718 02/04/22 0627  ALBUMIN 2.7* 2.6*   No results for input(s): "LIPASE", "AMYLASE" in the last 168 hours. CBC: Recent Labs  Lab 02/01/22 2151 02/04/22 0900  WBC 9.7 6.4  HGB 10.4* 10.4*  HCT 31.3* 31.7*  MCV 99.1 99.4  PLT 354 353   Blood Culture    Component Value Date/Time   SDES  04/21/2020 0328    BLOOD RIGHT HAND Performed at [facility], [place] [address], [place], [place] [address]    SPECREQUEST  04/21/2020 0328    BOTTLES DRAWN AEROBIC ONLY Blood Culture adequate  volume Performed at [place] [address], [place], [place] [address]    CULT  04/21/2020 0328    NO GROWTH 5 DAYS Performed at [facility], [place] [address], [place], [place] [address]    REPTSTATUS 04/26/2020 FINAL 04/21/2020 0328    Cardiac Enzymes: No results for input(s): "CKTOTAL", "CKMB", "CKMBINDEX", "TROPONINI" in the last 168 hours. CBG: No results for input(s): "GLUCAP" in the last 168 hours. Iron Studies: No results for input(s): "IRON", "TIBC", "TRANSFERRIN", "FERRITIN" in the last 72 hours. Lab Results  Component Value Date   INR 1.0 01/29/2021   INR 1.02 09/23/2016   Studies/Results: CT Angio Chest Pulmonary Embolism (PE) W or WO Contrast  Result Date: 02/02/2022 CLINICAL DATA:  Pulmonary embolus suspected EXAM: CT ANGIOGRAPHY CHEST WITH CONTRAST TECHNIQUE: Multidetector CT imaging of the chest was performed using the standard protocol during bolus administration of intravenous contrast. Multiplanar CT image reconstructions and MIPs were obtained to evaluate the vascular anatomy. RADIATION DOSE REDUCTION: This exam was performed according to the departmental dose-optimization program which includes automated exposure control, adjustment of the mA and/or kV according to patient size and/or use of iterative reconstruction technique. CONTRAST:  18m OMNIPAQUE IOHEXOL 350 MG/ML SOLN  COMPARISON:  Chest CT dated January 13th 2021 FINDINGS: Cardiovascular: No evidence of pulmonary embolus. Evaluation of the segmental and subsegmental pulmonary arteries is limited in the lower lungs due to respiratory motion artifact. Cardiomegaly. Severe left main and three-vessel coronary artery calcifications. Unchanged small pericardial effusion. Normal caliber thoracic aorta with moderate atherosclerotic disease. Mediastinum/Nodes: Esophagus and thyroid are unremarkable. No pathologically enlarged lymph nodes seen in the chest. Lungs/Pleura: Central airways are  patent. Mild bilateral bronchial wall thickening. Trace right pleural effusion and bibasilar atelectasis. Bilateral calcified granulomas. No consolidation or pneumothorax. Upper Abdomen: No acute abnormality. Musculoskeletal: No chest wall abnormality. No acute or significant osseous findings. Review of the MIP images confirms the above findings. IMPRESSION: 1. No evidence of pulmonary embolus. Evaluation of the segmental and subsegmental pulmonary arteries is limited in the lower lungs due to respiratory motion artifact. 2. Mild bilateral bronchial wall thickening, findings can be seen in the setting of bronchitis. 3. Unchanged small pericardial effusion. 4. Trace right pleural effusion. 5. Cardiomegaly. Severe left main and three-vessel coronary artery calcifications. 6. Aortic Atherosclerosis (ICD10-I70.0). Electronically Signed   By: Yetta Glassman M.D.   On: 02/02/2022 13:35    Medications:  cefTRIAXone (ROCEPHIN)  IV 2 g (02/03/22 1500)    benzonatate  100 mg Oral TID   calcium acetate  667 mg Oral TID WC   Chlorhexidine Gluconate Cloth  6 each Topical Q0600   Chlorhexidine Gluconate Cloth  6 each Topical Q0600   famotidine  20 mg Oral Daily   guaiFENesin  600 mg Oral BID   heparin  5,000 Units Subcutaneous Q8H   heparin sodium (porcine)       loratadine  10 mg Oral Daily   metoprolol tartrate  12.5 mg Oral TID   predniSONE  40 mg Oral QAC breakfast   sodium chloride flush  3 mL Intravenous Q12H   tamsulosin  0.4 mg Oral QPC supper    Dialysis Orders: [facility] TTS 3h 55mn  400/1.5  55.5kg   2/2 bath   Hep 4000  RFA AVF - last Hb 10.6 on 10/5 - no esa, vdra, or Fe - last HB 10/7 post wt 55kg  Home meds include - calcium acetate 1 ac tid, metoprolol 12.5 bid, tamsulosin, sevelamer carbonate 1 ac tid, prns/ vits/ supps  Assessment/Plan: AHRF - w/ SOB/ DOE, possibly bronchitis and/ or vol overload. Recent COVID infection but was treated w/ molnupiravir. CT chests w/o signs of infection  here.  Recent COVID infection - tested + on 9/27 here, off precautions ESRD - on HD TTS. On HD.  BP/vol - BP's stable, got 2L off w/ HD 10/10 w/o any BP drop. Suspect still has extra vol, will plan 2-3 L goal w/ HD today and will need to lower dry wt.  Anemia esrd - Hb 10.4, not getting Fe or esa at OP unit MBD ckd - Ca in range, cont binder/ phoslo.  Dispo - okay for discharge after HD today if patient remains stable. He can resume HD on Saturday in outpatient.  CTobie Poet NP [facility] 02/04/2022,10:14 AM  LOS: 2 days

## 2022-02-04 NOTE — Progress Notes (Signed)
Pt for d/c to home today. Pt receiving HD in the room this morning when navigator arrived. Contacted [facility] to advise clinic that pt will not be coming to today's appt as thought but will resume on Saturday. Clinic advised that pt's covid test yesterday was positive again.   Melven Sartorius Renal Navigator [phone]

## 2022-02-04 NOTE — Progress Notes (Signed)
Patient shows no distress or pain, Arrythmic but controlled in 90s. Education provided with good understanding. Medications sent to outside Rx.

## 2022-02-04 NOTE — Progress Notes (Signed)
This rn arrived to bedside 77M Alert and oriented.  Informed consent signed and in chart.   Treatment initiated: 0915 Treatment completed: 1225  Patient tolerated well. Sbp 80s with 5 minutes of tx remaining, sbp increased to 116 after  scheduled rinse back.  Alert, without acute distress.  Hand-off given to patient's nurse.   Access used: fistula right arm Access issues: none  Total UF removed: 2.2 liters Medication(s) given: heparin 2000 units Post HD VS:  Post HD weight: 51.6 kg bed wt   [facility]

## 2022-02-04 NOTE — TOC Transition Note (Signed)
Transition of Care [facility]) - CM/SW Discharge Note   Patient Details  Name: Jeffrey Payne MRN: [id number] Date of Birth: March 31, 1933  [facility] CM/SW Contact:  Tom-Johnson, Renea Ee, RN Phone Number: 02/04/2022, 10:18 AM   Clinical Narrative:     Patient is scheduled for discharge today. To continue oral abx outpatient. Prescription sent to his pharmacy. Family to transport at discharge. No further TOC needs noted.    Final next level of care: Home/Self Care Barriers to Discharge: Barriers Resolved   Patient Goals and CMS Choice Patient states their goals for this hospitalization and ongoing recovery are:: To retun home CMS Medicare.gov Compare Post Acute Care list provided to:: Patient Choice offered to / list presented to : NA  Discharge Placement                Patient to be transferred to facility by: Family      Discharge Plan and Services                DME Arranged: N/A DME Agency: NA       HH Arranged: NA HH Agency: NA        Social Determinants of Health (SDOH) Interventions     Readmission Risk Interventions     No data to display

## 2022-02-05 ENCOUNTER — Telehealth: Payer: Self-pay | Admitting: Nephrology

## 2022-02-05 LAB — HEPATITIS B SURFACE ANTIBODY, QUANTITATIVE: Hep B S AB Quant (Post): <3.1 m[IU]/mL — ABNORMAL LOW (ref >9.9)

## 2022-02-05 NOTE — Telephone Encounter (Signed)
Transition of care contact from inpatient facility  Date of discharge: 02/04/22 Date of contact: 02/05/22 Method: Phone Spoke to: Patient's wife   Patient contacted to discuss transition of care from recent inpatient hospitalization. Patient was admitted to [facility] from .02/01/22 to 02/04/22 .. with discharge diagnosis of Acute Respiratory  Failure Hypoxa bronchitis   Medication changes were reviewed.stressed  Levaquin  after Hd   Patient will follow up with his/her outpatient HD unit on: 02/06/22  [facility]

## 2022-02-25 DIAGNOSIS — N2581 Secondary hyperparathyroidism of renal origin: Secondary | ICD-10-CM | POA: Diagnosis not present

## 2022-02-25 DIAGNOSIS — D689 Coagulation defect, unspecified: Secondary | ICD-10-CM | POA: Diagnosis not present

## 2022-02-25 DIAGNOSIS — N186 End stage renal disease: Secondary | ICD-10-CM | POA: Diagnosis not present

## 2022-02-25 DIAGNOSIS — Z992 Dependence on renal dialysis: Secondary | ICD-10-CM | POA: Diagnosis not present

## 2022-02-25 DIAGNOSIS — D631 Anemia in chronic kidney disease: Secondary | ICD-10-CM | POA: Diagnosis not present

## 2022-02-27 DIAGNOSIS — N186 End stage renal disease: Secondary | ICD-10-CM | POA: Diagnosis not present

## 2022-02-27 DIAGNOSIS — D689 Coagulation defect, unspecified: Secondary | ICD-10-CM | POA: Diagnosis not present

## 2022-02-27 DIAGNOSIS — N2581 Secondary hyperparathyroidism of renal origin: Secondary | ICD-10-CM | POA: Diagnosis not present

## 2022-02-27 DIAGNOSIS — Z992 Dependence on renal dialysis: Secondary | ICD-10-CM | POA: Diagnosis not present

## 2022-02-27 DIAGNOSIS — D631 Anemia in chronic kidney disease: Secondary | ICD-10-CM | POA: Diagnosis not present

## 2022-03-02 DIAGNOSIS — N186 End stage renal disease: Secondary | ICD-10-CM | POA: Diagnosis not present

## 2022-03-02 DIAGNOSIS — Z992 Dependence on renal dialysis: Secondary | ICD-10-CM | POA: Diagnosis not present

## 2022-03-02 DIAGNOSIS — D631 Anemia in chronic kidney disease: Secondary | ICD-10-CM | POA: Diagnosis not present

## 2022-03-02 DIAGNOSIS — N2581 Secondary hyperparathyroidism of renal origin: Secondary | ICD-10-CM | POA: Diagnosis not present

## 2022-03-02 DIAGNOSIS — D689 Coagulation defect, unspecified: Secondary | ICD-10-CM | POA: Diagnosis not present

## 2022-03-04 DIAGNOSIS — D689 Coagulation defect, unspecified: Secondary | ICD-10-CM | POA: Diagnosis not present

## 2022-03-04 DIAGNOSIS — Z992 Dependence on renal dialysis: Secondary | ICD-10-CM | POA: Diagnosis not present

## 2022-03-04 DIAGNOSIS — D631 Anemia in chronic kidney disease: Secondary | ICD-10-CM | POA: Diagnosis not present

## 2022-03-04 DIAGNOSIS — N2581 Secondary hyperparathyroidism of renal origin: Secondary | ICD-10-CM | POA: Diagnosis not present

## 2022-03-04 DIAGNOSIS — N186 End stage renal disease: Secondary | ICD-10-CM | POA: Diagnosis not present

## 2022-03-04 DIAGNOSIS — R0981 Nasal congestion: Secondary | ICD-10-CM | POA: Diagnosis not present

## 2022-03-06 DIAGNOSIS — N2581 Secondary hyperparathyroidism of renal origin: Secondary | ICD-10-CM | POA: Diagnosis not present

## 2022-03-06 DIAGNOSIS — D689 Coagulation defect, unspecified: Secondary | ICD-10-CM | POA: Diagnosis not present

## 2022-03-06 DIAGNOSIS — Z992 Dependence on renal dialysis: Secondary | ICD-10-CM | POA: Diagnosis not present

## 2022-03-06 DIAGNOSIS — N186 End stage renal disease: Secondary | ICD-10-CM | POA: Diagnosis not present

## 2022-03-06 DIAGNOSIS — D631 Anemia in chronic kidney disease: Secondary | ICD-10-CM | POA: Diagnosis not present

## 2022-03-09 DIAGNOSIS — D689 Coagulation defect, unspecified: Secondary | ICD-10-CM | POA: Diagnosis not present

## 2022-03-09 DIAGNOSIS — D631 Anemia in chronic kidney disease: Secondary | ICD-10-CM | POA: Diagnosis not present

## 2022-03-09 DIAGNOSIS — N186 End stage renal disease: Secondary | ICD-10-CM | POA: Diagnosis not present

## 2022-03-09 DIAGNOSIS — Z992 Dependence on renal dialysis: Secondary | ICD-10-CM | POA: Diagnosis not present

## 2022-03-09 DIAGNOSIS — N2581 Secondary hyperparathyroidism of renal origin: Secondary | ICD-10-CM | POA: Diagnosis not present

## 2022-03-11 DIAGNOSIS — Z992 Dependence on renal dialysis: Secondary | ICD-10-CM | POA: Diagnosis not present

## 2022-03-11 DIAGNOSIS — D631 Anemia in chronic kidney disease: Secondary | ICD-10-CM | POA: Diagnosis not present

## 2022-03-11 DIAGNOSIS — N2581 Secondary hyperparathyroidism of renal origin: Secondary | ICD-10-CM | POA: Diagnosis not present

## 2022-03-11 DIAGNOSIS — D689 Coagulation defect, unspecified: Secondary | ICD-10-CM | POA: Diagnosis not present

## 2022-03-11 DIAGNOSIS — N186 End stage renal disease: Secondary | ICD-10-CM | POA: Diagnosis not present

## 2022-03-13 DIAGNOSIS — D631 Anemia in chronic kidney disease: Secondary | ICD-10-CM | POA: Diagnosis not present

## 2022-03-13 DIAGNOSIS — N186 End stage renal disease: Secondary | ICD-10-CM | POA: Diagnosis not present

## 2022-03-13 DIAGNOSIS — D689 Coagulation defect, unspecified: Secondary | ICD-10-CM | POA: Diagnosis not present

## 2022-03-13 DIAGNOSIS — Z992 Dependence on renal dialysis: Secondary | ICD-10-CM | POA: Diagnosis not present

## 2022-03-13 DIAGNOSIS — N2581 Secondary hyperparathyroidism of renal origin: Secondary | ICD-10-CM | POA: Diagnosis not present

## 2022-03-15 DIAGNOSIS — N2581 Secondary hyperparathyroidism of renal origin: Secondary | ICD-10-CM | POA: Diagnosis not present

## 2022-03-15 DIAGNOSIS — Z992 Dependence on renal dialysis: Secondary | ICD-10-CM | POA: Diagnosis not present

## 2022-03-15 DIAGNOSIS — N186 End stage renal disease: Secondary | ICD-10-CM | POA: Diagnosis not present

## 2022-03-15 DIAGNOSIS — D631 Anemia in chronic kidney disease: Secondary | ICD-10-CM | POA: Diagnosis not present

## 2022-03-15 DIAGNOSIS — D689 Coagulation defect, unspecified: Secondary | ICD-10-CM | POA: Diagnosis not present

## 2022-03-17 DIAGNOSIS — N186 End stage renal disease: Secondary | ICD-10-CM | POA: Diagnosis not present

## 2022-03-17 DIAGNOSIS — D689 Coagulation defect, unspecified: Secondary | ICD-10-CM | POA: Diagnosis not present

## 2022-03-17 DIAGNOSIS — Z992 Dependence on renal dialysis: Secondary | ICD-10-CM | POA: Diagnosis not present

## 2022-03-17 DIAGNOSIS — D631 Anemia in chronic kidney disease: Secondary | ICD-10-CM | POA: Diagnosis not present

## 2022-03-17 DIAGNOSIS — N2581 Secondary hyperparathyroidism of renal origin: Secondary | ICD-10-CM | POA: Diagnosis not present

## 2022-03-20 DIAGNOSIS — N186 End stage renal disease: Secondary | ICD-10-CM | POA: Diagnosis not present

## 2022-03-20 DIAGNOSIS — D631 Anemia in chronic kidney disease: Secondary | ICD-10-CM | POA: Diagnosis not present

## 2022-03-20 DIAGNOSIS — Z992 Dependence on renal dialysis: Secondary | ICD-10-CM | POA: Diagnosis not present

## 2022-03-20 DIAGNOSIS — D689 Coagulation defect, unspecified: Secondary | ICD-10-CM | POA: Diagnosis not present

## 2022-03-20 DIAGNOSIS — N2581 Secondary hyperparathyroidism of renal origin: Secondary | ICD-10-CM | POA: Diagnosis not present

## 2022-03-23 DIAGNOSIS — N186 End stage renal disease: Secondary | ICD-10-CM | POA: Diagnosis not present

## 2022-03-23 DIAGNOSIS — Z992 Dependence on renal dialysis: Secondary | ICD-10-CM | POA: Diagnosis not present

## 2022-03-23 DIAGNOSIS — D631 Anemia in chronic kidney disease: Secondary | ICD-10-CM | POA: Diagnosis not present

## 2022-03-23 DIAGNOSIS — D689 Coagulation defect, unspecified: Secondary | ICD-10-CM | POA: Diagnosis not present

## 2022-03-23 DIAGNOSIS — N2581 Secondary hyperparathyroidism of renal origin: Secondary | ICD-10-CM | POA: Diagnosis not present

## 2022-03-25 DIAGNOSIS — D631 Anemia in chronic kidney disease: Secondary | ICD-10-CM | POA: Diagnosis not present

## 2022-03-25 DIAGNOSIS — N186 End stage renal disease: Secondary | ICD-10-CM | POA: Diagnosis not present

## 2022-03-25 DIAGNOSIS — I129 Hypertensive chronic kidney disease with stage 1 through stage 4 chronic kidney disease, or unspecified chronic kidney disease: Secondary | ICD-10-CM | POA: Diagnosis not present

## 2022-03-25 DIAGNOSIS — Z992 Dependence on renal dialysis: Secondary | ICD-10-CM | POA: Diagnosis not present

## 2022-03-25 DIAGNOSIS — N2581 Secondary hyperparathyroidism of renal origin: Secondary | ICD-10-CM | POA: Diagnosis not present

## 2022-03-25 DIAGNOSIS — D689 Coagulation defect, unspecified: Secondary | ICD-10-CM | POA: Diagnosis not present

## 2022-03-27 DIAGNOSIS — N2581 Secondary hyperparathyroidism of renal origin: Secondary | ICD-10-CM | POA: Diagnosis not present

## 2022-03-27 DIAGNOSIS — D689 Coagulation defect, unspecified: Secondary | ICD-10-CM | POA: Diagnosis not present

## 2022-03-27 DIAGNOSIS — Z992 Dependence on renal dialysis: Secondary | ICD-10-CM | POA: Diagnosis not present

## 2022-03-27 DIAGNOSIS — N186 End stage renal disease: Secondary | ICD-10-CM | POA: Diagnosis not present

## 2022-03-30 DIAGNOSIS — N186 End stage renal disease: Secondary | ICD-10-CM | POA: Diagnosis not present

## 2022-03-30 DIAGNOSIS — D689 Coagulation defect, unspecified: Secondary | ICD-10-CM | POA: Diagnosis not present

## 2022-03-30 DIAGNOSIS — Z992 Dependence on renal dialysis: Secondary | ICD-10-CM | POA: Diagnosis not present

## 2022-03-30 DIAGNOSIS — N2581 Secondary hyperparathyroidism of renal origin: Secondary | ICD-10-CM | POA: Diagnosis not present

## 2022-04-01 DIAGNOSIS — N2581 Secondary hyperparathyroidism of renal origin: Secondary | ICD-10-CM | POA: Diagnosis not present

## 2022-04-01 DIAGNOSIS — D689 Coagulation defect, unspecified: Secondary | ICD-10-CM | POA: Diagnosis not present

## 2022-04-01 DIAGNOSIS — N186 End stage renal disease: Secondary | ICD-10-CM | POA: Diagnosis not present

## 2022-04-01 DIAGNOSIS — Z992 Dependence on renal dialysis: Secondary | ICD-10-CM | POA: Diagnosis not present

## 2022-04-03 DIAGNOSIS — D689 Coagulation defect, unspecified: Secondary | ICD-10-CM | POA: Diagnosis not present

## 2022-04-03 DIAGNOSIS — N2581 Secondary hyperparathyroidism of renal origin: Secondary | ICD-10-CM | POA: Diagnosis not present

## 2022-04-03 DIAGNOSIS — N186 End stage renal disease: Secondary | ICD-10-CM | POA: Diagnosis not present

## 2022-04-03 DIAGNOSIS — Z992 Dependence on renal dialysis: Secondary | ICD-10-CM | POA: Diagnosis not present

## 2022-04-06 ENCOUNTER — Other Ambulatory Visit: Payer: Self-pay

## 2022-04-06 ENCOUNTER — Encounter (HOSPITAL_BASED_OUTPATIENT_CLINIC_OR_DEPARTMENT_OTHER): Payer: Self-pay | Admitting: Emergency Medicine

## 2022-04-06 ENCOUNTER — Emergency Department (HOSPITAL_BASED_OUTPATIENT_CLINIC_OR_DEPARTMENT_OTHER)
Admission: EM | Admit: 2022-04-06 | Discharge: 2022-04-06 | Disposition: A | Discharge status: Home / Self Care | Payer: Medicare Other | Attending: Emergency Medicine | Admitting: Emergency Medicine

## 2022-04-06 DIAGNOSIS — R339 Retention of urine, unspecified: Secondary | ICD-10-CM | POA: Diagnosis not present

## 2022-04-06 DIAGNOSIS — N186 End stage renal disease: Secondary | ICD-10-CM | POA: Diagnosis not present

## 2022-04-06 DIAGNOSIS — Z992 Dependence on renal dialysis: Secondary | ICD-10-CM | POA: Diagnosis not present

## 2022-04-06 DIAGNOSIS — R140 Abdominal distension (gaseous): Secondary | ICD-10-CM | POA: Insufficient documentation

## 2022-04-06 LAB — CBC
HCT: 33.9 % — ABNORMAL LOW (ref 39.0–52.0)
Hemoglobin: 11 g/dL — ABNORMAL LOW (ref 13.0–17.0)
MCH: 33 pg (ref 26.0–34.0)
MCHC: 32.4 g/dL (ref 30.0–36.0)
MCV: 101.8 fL — ABNORMAL HIGH (ref 80.0–100.0)
Platelets: 340 10*3/uL (ref 150–400)
RBC: 3.33 MIL/uL — ABNORMAL LOW (ref 4.22–5.81)
RDW: 14.6 % (ref 11.5–15.5)
WBC: 8.1 10*3/uL (ref 4.0–10.5)
nRBC: 0 % (ref 0.0–0.2)

## 2022-04-06 LAB — URINALYSIS, ROUTINE W REFLEX MICROSCOPIC
Bilirubin Urine: NEGATIVE
Glucose, UA: NEGATIVE mg/dL
Ketones, ur: NEGATIVE mg/dL
Leukocytes,Ua: NEGATIVE
Nitrite: NEGATIVE
Protein, ur: 100 mg/dL — AB
Specific Gravity, Urine: 1.01 (ref 1.005–1.030)
pH: 7.5 (ref 5.0–8.0)

## 2022-04-06 LAB — BASIC METABOLIC PANEL
Anion gap: 16 — ABNORMAL HIGH (ref 5–15)
BUN: 37 mg/dL — ABNORMAL HIGH (ref 8–23)
CO2: 24 mmol/L (ref 22–32)
Calcium: 9.3 mg/dL (ref 8.9–10.3)
Chloride: 93 mmol/L — ABNORMAL LOW (ref 98–111)
Creatinine, Ser: 5.2 mg/dL — ABNORMAL HIGH (ref 0.61–1.24)
GFR, Estimated: 10 mL/min — ABNORMAL LOW (ref >60)
Glucose, Bld: 114 mg/dL — ABNORMAL HIGH (ref 70–99)
Potassium: 3.7 mmol/L (ref 3.5–5.1)
Sodium: 133 mmol/L — ABNORMAL LOW (ref 135–145)

## 2022-04-06 NOTE — ED Provider Notes (Signed)
[facility] EMERGENCY DEPT Provider Note   CSN: [id number] Arrival date & time: 04/06/22  1546     History Chief Complaint  Patient presents with   Urinary Retention    HPI Jeffrey Payne is a 88 y.o. male presenting for urinary retention.  He is an otherwise functional 88 year old male.  He endorses is baseline ESRD on dialysis.  He additionally endorses difficulty with urination at baseline with frequent episodes of urinary retention.  He states his last void was last night.  He denies fevers or chills, nausea vomiting, syncope shortness of breath. He is on Flomax daily.  He follows with local urology.   Patient's recorded medical, surgical, social, medication list and allergies were reviewed in the Snapshot window as part of the initial history.   Review of Systems   Review of Systems  Constitutional:  Negative for chills and fever.  HENT:  Negative for ear pain and sore throat.   Eyes:  Negative for pain and visual disturbance.  Respiratory:  Negative for cough and shortness of breath.   Cardiovascular:  Negative for chest pain and palpitations.  Gastrointestinal:  Negative for abdominal pain and vomiting.  Genitourinary:  Positive for difficulty urinating. Negative for dysuria, frequency and hematuria.  Musculoskeletal:  Negative for arthralgias and back pain.  Skin:  Negative for color change and rash.  Neurological:  Negative for seizures and syncope.  All other systems reviewed and are negative.   Physical Exam Updated Vital Signs BP (!) 166/48 (BP Location: Left Arm)   Pulse 63   Temp 97.9 F (36.6 C)   Resp 16   Ht '5\' 7"'$  (1.702 m)   Wt 68 kg   SpO2 98%   BMI 23.49 kg/m  Physical Exam Vitals and nursing note reviewed.  Constitutional:      General: He is not in acute distress.    Appearance: He is well-developed.  HENT:     Head: Normocephalic and atraumatic.  Eyes:     Conjunctiva/sclera: Conjunctivae normal.  Cardiovascular:     Rate  and Rhythm: Normal rate and regular rhythm.     Heart sounds: No murmur heard. Pulmonary:     Effort: Pulmonary effort is normal. No respiratory distress.     Breath sounds: Normal breath sounds.  Abdominal:     General: There is distension.     Palpations: Abdomen is soft.     Tenderness: There is abdominal tenderness. There is no guarding.  Musculoskeletal:        General: No swelling.     Cervical back: Neck supple.  Skin:    General: Skin is warm and dry.     Capillary Refill: Capillary refill takes less than 2 seconds.  Neurological:     Mental Status: He is alert.  Psychiatric:        Mood and Affect: Mood normal.      ED Course/ Medical Decision Making/ A&P    Procedures Ultrasound ED Renal  Date/Time: 04/06/2022 11:28 PM  Performed by: Tretha Sciara, MD Authorized by: Tretha Sciara, MD   Procedure details:    Indications: urinary retention     Technique:  BladderImages: archived Bladder findings:    Bladder:  Visualized   Volume:  400cc    Medications Ordered in ED Medications - No data to display  Medical Decision Making:    Jeffrey Payne is a 88 y.o. male who presented to the ED today with abdominal discomfort and difficulty urinating detailed above.  Patient's presentation is complicated by their history of multiple comorbid medical problems and advanced age.  Patient placed on continuous vitals and telemetry monitoring while in ED which was reviewed periodically.   Complete initial physical exam performed, notably the patient  was hemodynamically stable no acute distress.      Reviewed and confirmed nursing documentation for past medical history, family history, social history.    Initial Assessment:   This is most consistent with an acute possibly life-threatening injury.  Fortunately, his history of present illness and below exam findings are most consistent with acute urinary retention likely secondary to his known BPH and recurrent  history of similar. Point-of-care ultrasound demonstrated 400 cc in his bladder.  Foley catheter placed with appropriate symptomatic improvement.  Labs without any evidence of acute nephropathy though he does have chronic kidney disease. Given his well appearance, improvement of symptoms, close following with urology in the outpatient setting, patient stable for discharge with strict follow-up with urology within 5 days. He will also need to be seen by his PCP within 48 hours for reassessment of symptoms to ensure ongoing improvement.  Disposition:  I have considered need for hospitalization, however, considering all of the above, I believe this patient is stable for discharge at this time.  Patient/family educated about specific return precautions for given chief complaint and symptoms.  Patient/family educated about follow-up with PCP/urology.     Patient/family expressed understanding of return precautions and need for follow-up. Patient spoken to regarding all imaging and laboratory results and appropriate follow up for these results. All education provided in verbal form with additional information in written form. Time was allowed for answering of patient questions. Patient discharged.    Emergency Department Medication Summary:   Medications - No data to display      Clinical Impression:  1. Urinary retention      Discharge   Final Clinical Impression(s) / ED Diagnoses Final diagnoses:  Urinary retention    Rx / DC Orders ED Discharge Orders     None         Tretha Sciara, MD 04/06/22 2330

## 2022-04-06 NOTE — Discharge Instructions (Signed)
You are seen today for urinary retention.  The Foley catheter has successfully drained your bladder.  Use the catheter and follow-up with your primary care provider and urologist within the next week for further recommendations.  Continue dialysis as recommended and return with any changes in your symptoms.

## 2022-04-06 NOTE — ED Triage Notes (Signed)
Pt via pov from home with urinary retention. Pt states he urinates normally 3-4 times per day (dialysis pt) and has not urinated today at all. Pt reports pain in his abdomen at triage. Pt alert & oriented, nad noted.

## 2022-04-06 NOTE — ED Notes (Signed)
DC papers reviewed. No questions or concerns. No signs of distress. Pt assisted to wheelchair and out to lobby. Appropriate measures for safety taken. Given extra leg bag for foley. Event organiser for family to refresh memory on foley care. Pt and family satisfied with DC plan and education.

## 2022-04-08 DIAGNOSIS — Z992 Dependence on renal dialysis: Secondary | ICD-10-CM | POA: Diagnosis not present

## 2022-04-08 DIAGNOSIS — N2581 Secondary hyperparathyroidism of renal origin: Secondary | ICD-10-CM | POA: Diagnosis not present

## 2022-04-08 DIAGNOSIS — D689 Coagulation defect, unspecified: Secondary | ICD-10-CM | POA: Diagnosis not present

## 2022-04-08 DIAGNOSIS — N186 End stage renal disease: Secondary | ICD-10-CM | POA: Diagnosis not present

## 2022-04-09 DIAGNOSIS — Z08 Encounter for follow-up examination after completed treatment for malignant neoplasm: Secondary | ICD-10-CM | POA: Diagnosis not present

## 2022-04-09 DIAGNOSIS — C44329 Squamous cell carcinoma of skin of other parts of face: Secondary | ICD-10-CM | POA: Diagnosis not present

## 2022-04-09 DIAGNOSIS — Z85828 Personal history of other malignant neoplasm of skin: Secondary | ICD-10-CM | POA: Diagnosis not present

## 2022-04-10 DIAGNOSIS — N2581 Secondary hyperparathyroidism of renal origin: Secondary | ICD-10-CM | POA: Diagnosis not present

## 2022-04-10 DIAGNOSIS — Z992 Dependence on renal dialysis: Secondary | ICD-10-CM | POA: Diagnosis not present

## 2022-04-10 DIAGNOSIS — D689 Coagulation defect, unspecified: Secondary | ICD-10-CM | POA: Diagnosis not present

## 2022-04-10 DIAGNOSIS — N186 End stage renal disease: Secondary | ICD-10-CM | POA: Diagnosis not present

## 2022-04-13 DIAGNOSIS — D689 Coagulation defect, unspecified: Secondary | ICD-10-CM | POA: Diagnosis not present

## 2022-04-13 DIAGNOSIS — Z992 Dependence on renal dialysis: Secondary | ICD-10-CM | POA: Diagnosis not present

## 2022-04-13 DIAGNOSIS — N186 End stage renal disease: Secondary | ICD-10-CM | POA: Diagnosis not present

## 2022-04-13 DIAGNOSIS — N2581 Secondary hyperparathyroidism of renal origin: Secondary | ICD-10-CM | POA: Diagnosis not present

## 2022-04-15 DIAGNOSIS — D689 Coagulation defect, unspecified: Secondary | ICD-10-CM | POA: Diagnosis not present

## 2022-04-15 DIAGNOSIS — N2581 Secondary hyperparathyroidism of renal origin: Secondary | ICD-10-CM | POA: Diagnosis not present

## 2022-04-15 DIAGNOSIS — Z992 Dependence on renal dialysis: Secondary | ICD-10-CM | POA: Diagnosis not present

## 2022-04-15 DIAGNOSIS — N186 End stage renal disease: Secondary | ICD-10-CM | POA: Diagnosis not present

## 2022-04-17 DIAGNOSIS — N186 End stage renal disease: Secondary | ICD-10-CM | POA: Diagnosis not present

## 2022-04-17 DIAGNOSIS — N2581 Secondary hyperparathyroidism of renal origin: Secondary | ICD-10-CM | POA: Diagnosis not present

## 2022-04-17 DIAGNOSIS — D689 Coagulation defect, unspecified: Secondary | ICD-10-CM | POA: Diagnosis not present

## 2022-04-17 DIAGNOSIS — Z992 Dependence on renal dialysis: Secondary | ICD-10-CM | POA: Diagnosis not present

## 2022-04-20 DIAGNOSIS — N186 End stage renal disease: Secondary | ICD-10-CM | POA: Diagnosis not present

## 2022-04-20 DIAGNOSIS — N2581 Secondary hyperparathyroidism of renal origin: Secondary | ICD-10-CM | POA: Diagnosis not present

## 2022-04-20 DIAGNOSIS — Z992 Dependence on renal dialysis: Secondary | ICD-10-CM | POA: Diagnosis not present

## 2022-04-20 DIAGNOSIS — D689 Coagulation defect, unspecified: Secondary | ICD-10-CM | POA: Diagnosis not present

## 2022-04-23 DIAGNOSIS — R338 Other retention of urine: Secondary | ICD-10-CM | POA: Diagnosis not present

## 2022-04-24 DIAGNOSIS — Z992 Dependence on renal dialysis: Secondary | ICD-10-CM | POA: Diagnosis not present

## 2022-04-24 DIAGNOSIS — N2581 Secondary hyperparathyroidism of renal origin: Secondary | ICD-10-CM | POA: Diagnosis not present

## 2022-04-24 DIAGNOSIS — D689 Coagulation defect, unspecified: Secondary | ICD-10-CM | POA: Diagnosis not present

## 2022-04-24 DIAGNOSIS — N186 End stage renal disease: Secondary | ICD-10-CM | POA: Diagnosis not present

## 2022-04-25 DIAGNOSIS — Z992 Dependence on renal dialysis: Secondary | ICD-10-CM | POA: Diagnosis not present

## 2022-04-25 DIAGNOSIS — I129 Hypertensive chronic kidney disease with stage 1 through stage 4 chronic kidney disease, or unspecified chronic kidney disease: Secondary | ICD-10-CM | POA: Diagnosis not present

## 2022-04-25 DIAGNOSIS — N186 End stage renal disease: Secondary | ICD-10-CM | POA: Diagnosis not present

## 2022-04-27 DIAGNOSIS — Z992 Dependence on renal dialysis: Secondary | ICD-10-CM | POA: Diagnosis not present

## 2022-04-27 DIAGNOSIS — N2581 Secondary hyperparathyroidism of renal origin: Secondary | ICD-10-CM | POA: Diagnosis not present

## 2022-04-27 DIAGNOSIS — D689 Coagulation defect, unspecified: Secondary | ICD-10-CM | POA: Diagnosis not present

## 2022-04-27 DIAGNOSIS — D631 Anemia in chronic kidney disease: Secondary | ICD-10-CM | POA: Diagnosis not present

## 2022-04-27 DIAGNOSIS — N186 End stage renal disease: Secondary | ICD-10-CM | POA: Diagnosis not present

## 2022-04-28 DIAGNOSIS — T82858A Stenosis of vascular prosthetic devices, implants and grafts, initial encounter: Secondary | ICD-10-CM | POA: Diagnosis not present

## 2022-04-28 DIAGNOSIS — I871 Compression of vein: Secondary | ICD-10-CM | POA: Diagnosis not present

## 2022-04-28 DIAGNOSIS — Z992 Dependence on renal dialysis: Secondary | ICD-10-CM | POA: Diagnosis not present

## 2022-04-28 DIAGNOSIS — N186 End stage renal disease: Secondary | ICD-10-CM | POA: Diagnosis not present

## 2022-04-29 DIAGNOSIS — N186 End stage renal disease: Secondary | ICD-10-CM | POA: Diagnosis not present

## 2022-04-29 DIAGNOSIS — Z992 Dependence on renal dialysis: Secondary | ICD-10-CM | POA: Diagnosis not present

## 2022-04-29 DIAGNOSIS — N2581 Secondary hyperparathyroidism of renal origin: Secondary | ICD-10-CM | POA: Diagnosis not present

## 2022-04-29 DIAGNOSIS — D631 Anemia in chronic kidney disease: Secondary | ICD-10-CM | POA: Diagnosis not present

## 2022-04-29 DIAGNOSIS — D689 Coagulation defect, unspecified: Secondary | ICD-10-CM | POA: Diagnosis not present

## 2022-05-04 DIAGNOSIS — N186 End stage renal disease: Secondary | ICD-10-CM | POA: Diagnosis not present

## 2022-05-04 DIAGNOSIS — D631 Anemia in chronic kidney disease: Secondary | ICD-10-CM | POA: Diagnosis not present

## 2022-05-04 DIAGNOSIS — Z992 Dependence on renal dialysis: Secondary | ICD-10-CM | POA: Diagnosis not present

## 2022-05-04 DIAGNOSIS — D689 Coagulation defect, unspecified: Secondary | ICD-10-CM | POA: Diagnosis not present

## 2022-05-04 DIAGNOSIS — N2581 Secondary hyperparathyroidism of renal origin: Secondary | ICD-10-CM | POA: Diagnosis not present

## 2022-05-05 DIAGNOSIS — C44329 Squamous cell carcinoma of skin of other parts of face: Secondary | ICD-10-CM | POA: Diagnosis not present

## 2022-05-06 DIAGNOSIS — D689 Coagulation defect, unspecified: Secondary | ICD-10-CM | POA: Diagnosis not present

## 2022-05-06 DIAGNOSIS — D631 Anemia in chronic kidney disease: Secondary | ICD-10-CM | POA: Diagnosis not present

## 2022-05-06 DIAGNOSIS — N2581 Secondary hyperparathyroidism of renal origin: Secondary | ICD-10-CM | POA: Diagnosis not present

## 2022-05-06 DIAGNOSIS — N186 End stage renal disease: Secondary | ICD-10-CM | POA: Diagnosis not present

## 2022-05-06 DIAGNOSIS — Z992 Dependence on renal dialysis: Secondary | ICD-10-CM | POA: Diagnosis not present

## 2022-05-08 DIAGNOSIS — D689 Coagulation defect, unspecified: Secondary | ICD-10-CM | POA: Diagnosis not present

## 2022-05-08 DIAGNOSIS — N2581 Secondary hyperparathyroidism of renal origin: Secondary | ICD-10-CM | POA: Diagnosis not present

## 2022-05-08 DIAGNOSIS — Z992 Dependence on renal dialysis: Secondary | ICD-10-CM | POA: Diagnosis not present

## 2022-05-08 DIAGNOSIS — D631 Anemia in chronic kidney disease: Secondary | ICD-10-CM | POA: Diagnosis not present

## 2022-05-08 DIAGNOSIS — N186 End stage renal disease: Secondary | ICD-10-CM | POA: Diagnosis not present

## 2022-05-11 DIAGNOSIS — D631 Anemia in chronic kidney disease: Secondary | ICD-10-CM | POA: Diagnosis not present

## 2022-05-11 DIAGNOSIS — Z992 Dependence on renal dialysis: Secondary | ICD-10-CM | POA: Diagnosis not present

## 2022-05-11 DIAGNOSIS — N2581 Secondary hyperparathyroidism of renal origin: Secondary | ICD-10-CM | POA: Diagnosis not present

## 2022-05-11 DIAGNOSIS — D689 Coagulation defect, unspecified: Secondary | ICD-10-CM | POA: Diagnosis not present

## 2022-05-11 DIAGNOSIS — N186 End stage renal disease: Secondary | ICD-10-CM | POA: Diagnosis not present

## 2022-05-13 DIAGNOSIS — D631 Anemia in chronic kidney disease: Secondary | ICD-10-CM | POA: Diagnosis not present

## 2022-05-13 DIAGNOSIS — N2581 Secondary hyperparathyroidism of renal origin: Secondary | ICD-10-CM | POA: Diagnosis not present

## 2022-05-13 DIAGNOSIS — Z992 Dependence on renal dialysis: Secondary | ICD-10-CM | POA: Diagnosis not present

## 2022-05-13 DIAGNOSIS — N186 End stage renal disease: Secondary | ICD-10-CM | POA: Diagnosis not present

## 2022-05-13 DIAGNOSIS — D689 Coagulation defect, unspecified: Secondary | ICD-10-CM | POA: Diagnosis not present

## 2022-05-15 DIAGNOSIS — N2581 Secondary hyperparathyroidism of renal origin: Secondary | ICD-10-CM | POA: Diagnosis not present

## 2022-05-15 DIAGNOSIS — Z992 Dependence on renal dialysis: Secondary | ICD-10-CM | POA: Diagnosis not present

## 2022-05-15 DIAGNOSIS — N186 End stage renal disease: Secondary | ICD-10-CM | POA: Diagnosis not present

## 2022-05-15 DIAGNOSIS — D689 Coagulation defect, unspecified: Secondary | ICD-10-CM | POA: Diagnosis not present

## 2022-05-15 DIAGNOSIS — D631 Anemia in chronic kidney disease: Secondary | ICD-10-CM | POA: Diagnosis not present

## 2022-05-18 DIAGNOSIS — N186 End stage renal disease: Secondary | ICD-10-CM | POA: Diagnosis not present

## 2022-05-18 DIAGNOSIS — N2581 Secondary hyperparathyroidism of renal origin: Secondary | ICD-10-CM | POA: Diagnosis not present

## 2022-05-18 DIAGNOSIS — D631 Anemia in chronic kidney disease: Secondary | ICD-10-CM | POA: Diagnosis not present

## 2022-05-18 DIAGNOSIS — D689 Coagulation defect, unspecified: Secondary | ICD-10-CM | POA: Diagnosis not present

## 2022-05-18 DIAGNOSIS — Z992 Dependence on renal dialysis: Secondary | ICD-10-CM | POA: Diagnosis not present

## 2022-05-20 DIAGNOSIS — N186 End stage renal disease: Secondary | ICD-10-CM | POA: Diagnosis not present

## 2022-05-20 DIAGNOSIS — Z992 Dependence on renal dialysis: Secondary | ICD-10-CM | POA: Diagnosis not present

## 2022-05-20 DIAGNOSIS — N2581 Secondary hyperparathyroidism of renal origin: Secondary | ICD-10-CM | POA: Diagnosis not present

## 2022-05-20 DIAGNOSIS — D689 Coagulation defect, unspecified: Secondary | ICD-10-CM | POA: Diagnosis not present

## 2022-05-20 DIAGNOSIS — D631 Anemia in chronic kidney disease: Secondary | ICD-10-CM | POA: Diagnosis not present

## 2022-05-22 DIAGNOSIS — N2581 Secondary hyperparathyroidism of renal origin: Secondary | ICD-10-CM | POA: Diagnosis not present

## 2022-05-22 DIAGNOSIS — Z992 Dependence on renal dialysis: Secondary | ICD-10-CM | POA: Diagnosis not present

## 2022-05-22 DIAGNOSIS — D631 Anemia in chronic kidney disease: Secondary | ICD-10-CM | POA: Diagnosis not present

## 2022-05-22 DIAGNOSIS — D689 Coagulation defect, unspecified: Secondary | ICD-10-CM | POA: Diagnosis not present

## 2022-05-22 DIAGNOSIS — N186 End stage renal disease: Secondary | ICD-10-CM | POA: Diagnosis not present

## 2022-05-24 DIAGNOSIS — C44329 Squamous cell carcinoma of skin of other parts of face: Secondary | ICD-10-CM | POA: Diagnosis not present

## 2022-05-26 DIAGNOSIS — N186 End stage renal disease: Secondary | ICD-10-CM | POA: Diagnosis not present

## 2022-05-26 DIAGNOSIS — I129 Hypertensive chronic kidney disease with stage 1 through stage 4 chronic kidney disease, or unspecified chronic kidney disease: Secondary | ICD-10-CM | POA: Diagnosis not present

## 2022-05-26 DIAGNOSIS — Z992 Dependence on renal dialysis: Secondary | ICD-10-CM | POA: Diagnosis not present

## 2022-05-27 DIAGNOSIS — Z992 Dependence on renal dialysis: Secondary | ICD-10-CM | POA: Diagnosis not present

## 2022-05-27 DIAGNOSIS — D689 Coagulation defect, unspecified: Secondary | ICD-10-CM | POA: Diagnosis not present

## 2022-05-27 DIAGNOSIS — N186 End stage renal disease: Secondary | ICD-10-CM | POA: Diagnosis not present

## 2022-05-27 DIAGNOSIS — N2581 Secondary hyperparathyroidism of renal origin: Secondary | ICD-10-CM | POA: Diagnosis not present

## 2022-05-29 DIAGNOSIS — N2581 Secondary hyperparathyroidism of renal origin: Secondary | ICD-10-CM | POA: Diagnosis not present

## 2022-05-29 DIAGNOSIS — D689 Coagulation defect, unspecified: Secondary | ICD-10-CM | POA: Diagnosis not present

## 2022-05-29 DIAGNOSIS — Z992 Dependence on renal dialysis: Secondary | ICD-10-CM | POA: Diagnosis not present

## 2022-05-29 DIAGNOSIS — N186 End stage renal disease: Secondary | ICD-10-CM | POA: Diagnosis not present

## 2022-06-01 DIAGNOSIS — Z992 Dependence on renal dialysis: Secondary | ICD-10-CM | POA: Diagnosis not present

## 2022-06-01 DIAGNOSIS — N186 End stage renal disease: Secondary | ICD-10-CM | POA: Diagnosis not present

## 2022-06-01 DIAGNOSIS — N2581 Secondary hyperparathyroidism of renal origin: Secondary | ICD-10-CM | POA: Diagnosis not present

## 2022-06-01 DIAGNOSIS — D689 Coagulation defect, unspecified: Secondary | ICD-10-CM | POA: Diagnosis not present

## 2022-06-04 DIAGNOSIS — N2581 Secondary hyperparathyroidism of renal origin: Secondary | ICD-10-CM | POA: Diagnosis not present

## 2022-06-04 DIAGNOSIS — N186 End stage renal disease: Secondary | ICD-10-CM | POA: Diagnosis not present

## 2022-06-04 DIAGNOSIS — Z992 Dependence on renal dialysis: Secondary | ICD-10-CM | POA: Diagnosis not present

## 2022-06-04 DIAGNOSIS — D689 Coagulation defect, unspecified: Secondary | ICD-10-CM | POA: Diagnosis not present

## 2022-06-05 DIAGNOSIS — N186 End stage renal disease: Secondary | ICD-10-CM | POA: Diagnosis not present

## 2022-06-05 DIAGNOSIS — D689 Coagulation defect, unspecified: Secondary | ICD-10-CM | POA: Diagnosis not present

## 2022-06-05 DIAGNOSIS — N2581 Secondary hyperparathyroidism of renal origin: Secondary | ICD-10-CM | POA: Diagnosis not present

## 2022-06-05 DIAGNOSIS — Z992 Dependence on renal dialysis: Secondary | ICD-10-CM | POA: Diagnosis not present

## 2022-06-08 DIAGNOSIS — N2581 Secondary hyperparathyroidism of renal origin: Secondary | ICD-10-CM | POA: Diagnosis not present

## 2022-06-08 DIAGNOSIS — D689 Coagulation defect, unspecified: Secondary | ICD-10-CM | POA: Diagnosis not present

## 2022-06-08 DIAGNOSIS — N186 End stage renal disease: Secondary | ICD-10-CM | POA: Diagnosis not present

## 2022-06-08 DIAGNOSIS — Z992 Dependence on renal dialysis: Secondary | ICD-10-CM | POA: Diagnosis not present

## 2022-06-09 DIAGNOSIS — Z992 Dependence on renal dialysis: Secondary | ICD-10-CM | POA: Diagnosis not present

## 2022-06-09 DIAGNOSIS — R051 Acute cough: Secondary | ICD-10-CM | POA: Diagnosis not present

## 2022-06-09 DIAGNOSIS — J208 Acute bronchitis due to other specified organisms: Secondary | ICD-10-CM | POA: Diagnosis not present

## 2022-06-09 DIAGNOSIS — Z03818 Encounter for observation for suspected exposure to other biological agents ruled out: Secondary | ICD-10-CM | POA: Diagnosis not present

## 2022-06-09 DIAGNOSIS — Z6820 Body mass index (BMI) 20.0-20.9, adult: Secondary | ICD-10-CM | POA: Diagnosis not present

## 2022-06-09 DIAGNOSIS — N186 End stage renal disease: Secondary | ICD-10-CM | POA: Diagnosis not present

## 2022-06-10 DIAGNOSIS — N2581 Secondary hyperparathyroidism of renal origin: Secondary | ICD-10-CM | POA: Diagnosis not present

## 2022-06-10 DIAGNOSIS — D689 Coagulation defect, unspecified: Secondary | ICD-10-CM | POA: Diagnosis not present

## 2022-06-10 DIAGNOSIS — N186 End stage renal disease: Secondary | ICD-10-CM | POA: Diagnosis not present

## 2022-06-10 DIAGNOSIS — Z992 Dependence on renal dialysis: Secondary | ICD-10-CM | POA: Diagnosis not present

## 2022-06-12 IMAGING — XA IR FLUORO GUIDE CV LINE*R*
2 series · 2 of 2 positions shown · non-contrast
Comparison: none

INDICATION: Need for dialysis access

[Series 1: fl (-) angio · 1 of 1 slices shown (1 of 2)]
[im 1/1]
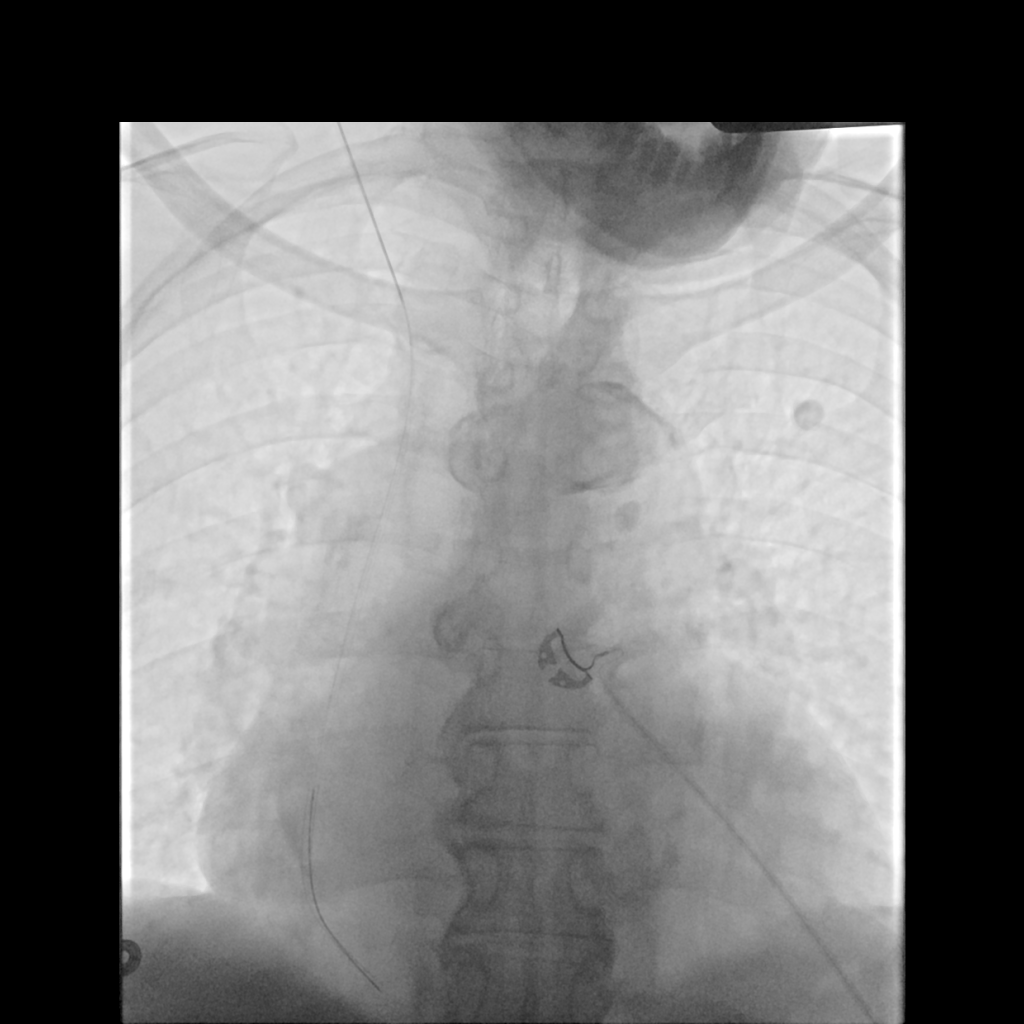

[Series 2: fl (-) angio · 1 of 1 slices shown (2 of 2)]
[im 1/1]
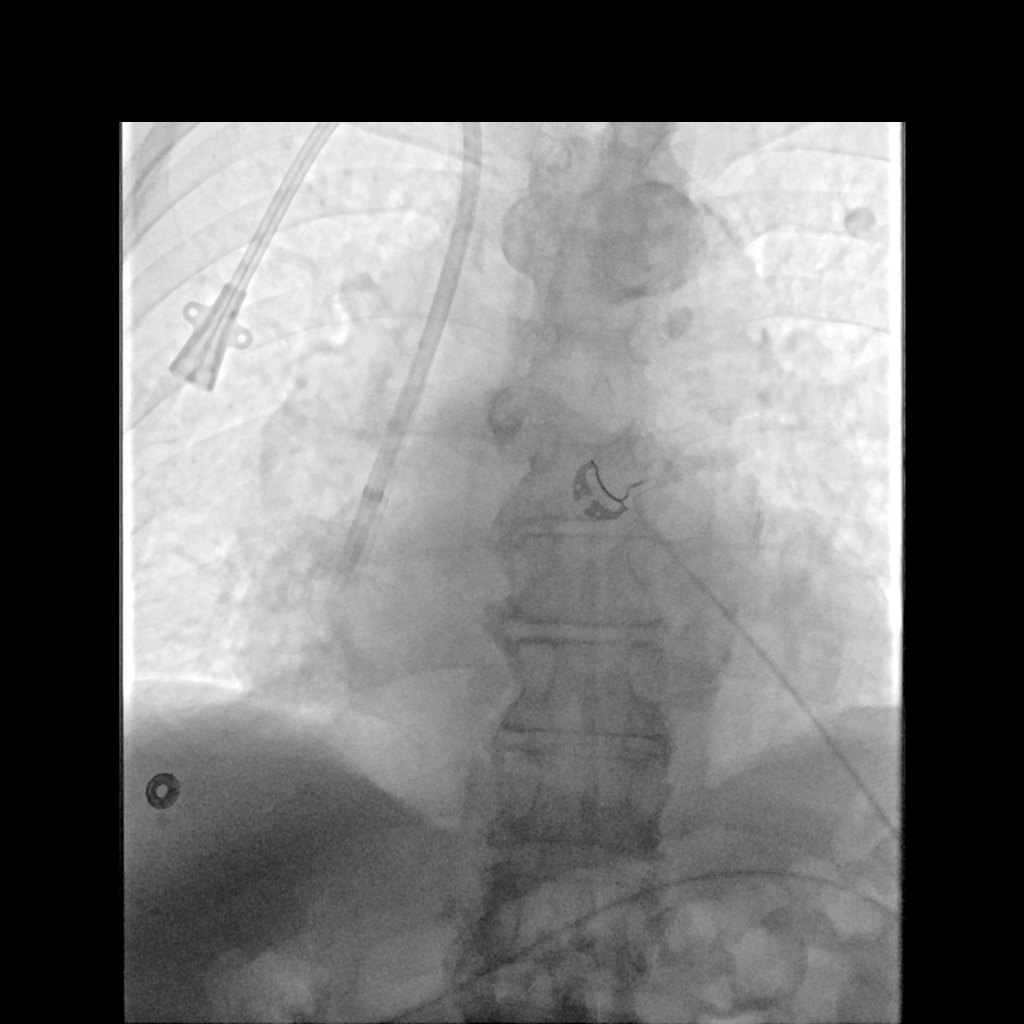

[2 of 2 positions shown; findings below may reference images not displayed]

MEDICATIONS:
Ancef 2 g IV; The antibiotic was administered within an appropriate
time interval prior to skin puncture.

ANESTHESIA/SEDATION:
Moderate (conscious) sedation was employed during this procedure. A
total of Versed 0.5 mg and Fentanyl 12.5 mcg was administered
intravenously.

Moderate Sedation Time: 23 minutes. The patient's level of
consciousness and vital signs were monitored continuously by
radiology nursing throughout the procedure under my direct
supervision.

FLUOROSCOPY TIME:  Fluoroscopy Time: 3 minutes 6 seconds with 2
exposures

COMPLICATIONS:
None immediate.

PROCEDURE:
Informed written consent was obtained from the patient after a
thorough discussion of the procedural risks, benefits and
alternatives. All questions were addressed. Maximal Sterile Barrier
Technique was utilized including caps, mask, sterile gowns, sterile
gloves, sterile drape, hand hygiene and skin antiseptic. A timeout
was performed prior to the initiation of the procedure.

The patient was placed supine on the exam table. The right neck and
chest was prepped and draped in the standard sterile fashion.
Ultrasound was used to evaluate the right internal jugular vein,
which found to be patent and suitable for access. An ultrasound
image was permanently stored in the electronic medical record. Using
ultrasound guidance, the right internal jugular vein was directly
punctured using a 21 gauge micropuncture [DATE] wire was
advanced into the SVC, followed by serial tract dilation and
placement of an 035 wire which was advanced into the IVC. Attention
was then turned to an infraclavicular location, where a small
dermatotomy was made after the administration of additional local
anesthetic. From this location, a 19 cm tip-to-cuff hemodialysis
catheter was tunneled to the venotomy site. A peel-away sheath was
then advanced over the access wire. Through this peel-away sheath,
the hemodialysis catheter was advanced into the central veins, such
that the tip was positioned near the superior cavoatrial junction.
The line was found to flush and aspirate appropriately. It was
sutured to the skin using 0 silk suture, and the venotomy was closed
with Dermabond. A sterile dressing was placed. The patient tolerated
the procedure well without immediate complication.
IMPRESSION: Successful placement of a tunneled hemodialysis catheter using the
right internal jugular vein. The line is ready for immediate use.

## 2022-06-14 ENCOUNTER — Emergency Department (HOSPITAL_COMMUNITY): Payer: Medicare Other

## 2022-06-14 ENCOUNTER — Observation Stay (HOSPITAL_COMMUNITY)
Admission: EM | Admit: 2022-06-14 | Discharge: 2022-06-15 | Disposition: A | Discharge status: Home / Self Care | Payer: Medicare Other | Attending: Internal Medicine | Admitting: Internal Medicine

## 2022-06-14 ENCOUNTER — Encounter (HOSPITAL_COMMUNITY): Payer: Self-pay | Admitting: Internal Medicine

## 2022-06-14 DIAGNOSIS — I5043 Acute on chronic combined systolic (congestive) and diastolic (congestive) heart failure: Secondary | ICD-10-CM | POA: Diagnosis not present

## 2022-06-14 DIAGNOSIS — E785 Hyperlipidemia, unspecified: Secondary | ICD-10-CM | POA: Diagnosis present

## 2022-06-14 DIAGNOSIS — R0902 Hypoxemia: Secondary | ICD-10-CM | POA: Diagnosis not present

## 2022-06-14 DIAGNOSIS — K219 Gastro-esophageal reflux disease without esophagitis: Secondary | ICD-10-CM | POA: Diagnosis not present

## 2022-06-14 DIAGNOSIS — I10 Essential (primary) hypertension: Secondary | ICD-10-CM | POA: Diagnosis not present

## 2022-06-14 DIAGNOSIS — Z91158 Patient's noncompliance with renal dialysis for other reason: Secondary | ICD-10-CM

## 2022-06-14 DIAGNOSIS — N138 Other obstructive and reflux uropathy: Secondary | ICD-10-CM | POA: Diagnosis not present

## 2022-06-14 DIAGNOSIS — D631 Anemia in chronic kidney disease: Secondary | ICD-10-CM | POA: Diagnosis present

## 2022-06-14 DIAGNOSIS — Z992 Dependence on renal dialysis: Secondary | ICD-10-CM | POA: Insufficient documentation

## 2022-06-14 DIAGNOSIS — I499 Cardiac arrhythmia, unspecified: Secondary | ICD-10-CM | POA: Diagnosis not present

## 2022-06-14 DIAGNOSIS — I132 Hypertensive heart and chronic kidney disease with heart failure and with stage 5 chronic kidney disease, or end stage renal disease: Secondary | ICD-10-CM | POA: Insufficient documentation

## 2022-06-14 DIAGNOSIS — N401 Enlarged prostate with lower urinary tract symptoms: Secondary | ICD-10-CM | POA: Diagnosis present

## 2022-06-14 DIAGNOSIS — Z743 Need for continuous supervision: Secondary | ICD-10-CM | POA: Diagnosis not present

## 2022-06-14 DIAGNOSIS — I4719 Other supraventricular tachycardia: Secondary | ICD-10-CM | POA: Diagnosis present

## 2022-06-14 DIAGNOSIS — Z20822 Contact with and (suspected) exposure to covid-19: Secondary | ICD-10-CM | POA: Diagnosis not present

## 2022-06-14 DIAGNOSIS — I447 Left bundle-branch block, unspecified: Secondary | ICD-10-CM | POA: Insufficient documentation

## 2022-06-14 DIAGNOSIS — R0602 Shortness of breath: Secondary | ICD-10-CM | POA: Diagnosis not present

## 2022-06-14 DIAGNOSIS — C8800 Waldenstrom macroglobulinemia not having achieved remission: Secondary | ICD-10-CM | POA: Diagnosis present

## 2022-06-14 DIAGNOSIS — Z8546 Personal history of malignant neoplasm of prostate: Secondary | ICD-10-CM | POA: Insufficient documentation

## 2022-06-14 DIAGNOSIS — R059 Cough, unspecified: Secondary | ICD-10-CM | POA: Diagnosis not present

## 2022-06-14 DIAGNOSIS — Z79899 Other long term (current) drug therapy: Secondary | ICD-10-CM | POA: Diagnosis not present

## 2022-06-14 DIAGNOSIS — N186 End stage renal disease: Secondary | ICD-10-CM | POA: Diagnosis not present

## 2022-06-14 DIAGNOSIS — N189 Chronic kidney disease, unspecified: Secondary | ICD-10-CM | POA: Diagnosis not present

## 2022-06-14 DIAGNOSIS — J9601 Acute respiratory failure with hypoxia: Principal | ICD-10-CM | POA: Diagnosis present

## 2022-06-14 DIAGNOSIS — J8410 Pulmonary fibrosis, unspecified: Secondary | ICD-10-CM | POA: Diagnosis present

## 2022-06-14 DIAGNOSIS — R0600 Dyspnea, unspecified: Secondary | ICD-10-CM

## 2022-06-14 DIAGNOSIS — Z862 Personal history of diseases of the blood and blood-forming organs and certain disorders involving the immune mechanism: Secondary | ICD-10-CM | POA: Diagnosis present

## 2022-06-14 DIAGNOSIS — I120 Hypertensive chronic kidney disease with stage 5 chronic kidney disease or end stage renal disease: Secondary | ICD-10-CM | POA: Diagnosis not present

## 2022-06-14 LAB — CBC
HCT: 37.9 % — ABNORMAL LOW (ref 39.0–52.0)
Hemoglobin: 12.5 g/dL — ABNORMAL LOW (ref 13.0–17.0)
MCH: 33.4 pg (ref 26.0–34.0)
MCHC: 33 g/dL (ref 30.0–36.0)
MCV: 101.3 fL — ABNORMAL HIGH (ref 80.0–100.0)
Platelets: 236 10*3/uL (ref 150–400)
RBC: 3.74 MIL/uL — ABNORMAL LOW (ref 4.22–5.81)
RDW: 14.3 % (ref 11.5–15.5)
WBC: 7.5 10*3/uL (ref 4.0–10.5)
nRBC: 0 % (ref 0.0–0.2)

## 2022-06-14 LAB — RESP PANEL BY RT-PCR (RSV, FLU A&B, COVID)  RVPGX2
Influenza A by PCR: NEGATIVE
Influenza B by PCR: NEGATIVE
Resp Syncytial Virus by PCR: NEGATIVE
SARS Coronavirus 2 by RT PCR: NEGATIVE

## 2022-06-14 LAB — BASIC METABOLIC PANEL
Anion gap: 20 — ABNORMAL HIGH (ref 5–15)
BUN: 40 mg/dL — ABNORMAL HIGH (ref 8–23)
CO2: 22 mmol/L (ref 22–32)
Calcium: 9.3 mg/dL (ref 8.9–10.3)
Chloride: 94 mmol/L — ABNORMAL LOW (ref 98–111)
Creatinine, Ser: 6.55 mg/dL — ABNORMAL HIGH (ref 0.61–1.24)
GFR, Estimated: 8 mL/min — ABNORMAL LOW (ref >60)
Glucose, Bld: 96 mg/dL (ref 70–99)
Potassium: 3.9 mmol/L (ref 3.5–5.1)
Sodium: 136 mmol/L (ref 135–145)

## 2022-06-14 LAB — HEPATITIS B SURFACE ANTIGEN: Hepatitis B Surface Ag: NONREACTIVE

## 2022-06-14 MED ORDER — LIDOCAINE HCL (PF) 1 % IJ SOLN: 5.0000 mL | INTRAMUSCULAR | Status: DC | PRN

## 2022-06-14 MED ORDER — CALCIUM ACETATE (PHOS BINDER) 667 MG PO CAPS
667.0000 mg | ORAL_CAPSULE | Freq: Three times a day (TID) | ORAL | Status: DC
  Administered 2022-06-14 – 2022-06-15 (×2): 667 mg via ORAL
  Filled 2022-06-14 (×3): qty 1

## 2022-06-14 MED ORDER — TAMSULOSIN HCL 0.4 MG PO CAPS
0.4000 mg | ORAL_CAPSULE | Freq: Every day | ORAL | Status: DC
  Administered 2022-06-14: 0.4 mg via ORAL
  Filled 2022-06-14: qty 1

## 2022-06-14 MED ORDER — FAMOTIDINE 20 MG PO TABS
20.0000 mg | ORAL_TABLET | Freq: Every day | ORAL | Status: DC
  Administered 2022-06-14 – 2022-06-15 (×2): 20 mg via ORAL
  Filled 2022-06-14 (×2): qty 1

## 2022-06-14 MED ORDER — CARBOXYMETHYLCELLULOSE SODIUM 0.5 % OP SOLN: 1.0000 [drp] | Freq: Every day | OPHTHALMIC | Status: DC | PRN

## 2022-06-14 MED ORDER — PENTAFLUOROPROP-TETRAFLUOROETH EX AERO
1.0000 | INHALATION_SPRAY | CUTANEOUS | Status: DC | PRN
  Filled 2022-06-14: qty 116

## 2022-06-14 MED ORDER — CHLORHEXIDINE GLUCONATE CLOTH 2 % EX PADS: 6.0000 | MEDICATED_PAD | Freq: Every day | CUTANEOUS | Status: DC

## 2022-06-14 MED ORDER — ALTEPLASE 2 MG IJ SOLR: 2.0000 mg | Freq: Once | INTRAMUSCULAR | Status: DC | PRN

## 2022-06-14 MED ORDER — HEPARIN SODIUM (PORCINE) 5000 UNIT/ML IJ SOLN
5000.0000 [IU] | Freq: Three times a day (TID) | INTRAMUSCULAR | Status: DC
  Administered 2022-06-15: 5000 [IU] via SUBCUTANEOUS
  Filled 2022-06-14 (×2): qty 1

## 2022-06-14 MED ORDER — METOPROLOL TARTRATE 12.5 MG HALF TABLET
12.5000 mg | ORAL_TABLET | Freq: Three times a day (TID) | ORAL | Status: DC
  Filled 2022-06-14 (×2): qty 1

## 2022-06-14 MED ORDER — LIDOCAINE-PRILOCAINE 2.5-2.5 % EX CREA
1.0000 | TOPICAL_CREAM | CUTANEOUS | Status: DC | PRN
  Filled 2022-06-14: qty 5

## 2022-06-14 MED ORDER — HEPARIN SODIUM (PORCINE) 1000 UNIT/ML DIALYSIS
1000.0000 [IU] | INTRAMUSCULAR | Status: DC | PRN
  Administered 2022-06-14: 4000 [IU] via INTRAVENOUS_CENTRAL
  Filled 2022-06-14 (×3): qty 1

## 2022-06-14 MED ORDER — SODIUM CHLORIDE 0.9% FLUSH
3.0000 mL | Freq: Two times a day (BID) | INTRAVENOUS | Status: DC
  Administered 2022-06-14 – 2022-06-15 (×2): 3 mL via INTRAVENOUS

## 2022-06-14 MED ORDER — POLYETHYLENE GLYCOL 3350 17 G PO PACK: 17.0000 g | PACK | Freq: Every day | ORAL | Status: DC | PRN

## 2022-06-14 MED ORDER — ACETAMINOPHEN 325 MG PO TABS: 650.0000 mg | ORAL_TABLET | Freq: Four times a day (QID) | ORAL | Status: DC | PRN

## 2022-06-14 MED ORDER — GUAIFENESIN ER 600 MG PO TB12
600.0000 mg | ORAL_TABLET | Freq: Two times a day (BID) | ORAL | Status: DC | PRN
  Administered 2022-06-14: 600 mg via ORAL
  Filled 2022-06-14: qty 1

## 2022-06-14 MED ORDER — ACETAMINOPHEN 650 MG RE SUPP: 650.0000 mg | Freq: Four times a day (QID) | RECTAL | Status: DC | PRN

## 2022-06-14 MED ORDER — CALCIUM ACETATE (PHOS BINDER) 667 MG PO CAPS
667.0000 mg | ORAL_CAPSULE | Freq: Three times a day (TID) | ORAL | Status: DC
  Filled 2022-06-14: qty 1

## 2022-06-14 MED ORDER — POLYVINYL ALCOHOL 1.4 % OP SOLN
1.0000 [drp] | OPHTHALMIC | Status: DC | PRN
  Filled 2022-06-14: qty 15

## 2022-06-14 NOTE — ED Notes (Signed)
ED TO INPATIENT HANDOFF REPORT  ED Nurse Name and Phone #: Altha Harm [phone]  S Name/Age/Gender Jeffrey Payne 89 y.o. male Room/Bed: 041C/041C  Code Status   Code Status: Full Code  Home/SNF/Other Home Patient oriented to: self, place, time, and situation Is this baseline? Yes   Triage Complete: Triage complete  Chief Complaint Acute respiratory failure with hypoxia ([place]) [J96.01]  Triage Note Pt BIB [facility] from doctor's office due to SpO2 of 88% on room air.  Pt reports he has been having cough and congestion for the past 5 days.  Hx A-fib and dialysis pt.  Tuesday, Thursday and Saturday.  Pt did miss one day last week to not feeling well.  VSS.     Allergies No Known Allergies  Level of Care/Admitting Diagnosis ED Disposition     ED Disposition  Admit   Condition  --   Comment  Hospital Area: [facility] [[id number]]  Level of Care: Telemetry Medical [104]  May place patient in observation at [facility] or [place] if equivalent level of care is available:: No  Covid Evaluation: Asymptomatic - no recent exposure (last 10 days) testing not required  Diagnosis: Acute respiratory failure with hypoxia [facility]) [id number]  Admitting Physician: Marcelyn Bruins [id number]  Attending Physician: Marcelyn Bruins [id number]          B Medical/Surgery History Past Medical History:  Diagnosis Date   Anemia    Arthritis    knees   Cancer ([place])    prostate   ED (erectile dysfunction)    GERD (gastroesophageal reflux disease)    ocassional    Hard of hearing    Hypertension    Hypertriglyceridemia    Renal failure    Dialysis dependent   Past Surgical History:  Procedure Laterality Date   A/V FISTULAGRAM N/A 07/25/2020   Procedure: A/V FISTULAGRAM - Left Arm;  Surgeon: Elam Dutch, MD;  Location: [facility];  Service: Cardiovascular;  Laterality: N/A;   AV FISTULA PLACEMENT Left 03/04/2020   Procedure: BRACHIOCEPHALIC ARTERIOVENOUS  (AV) FISTULA CREATION LEFT;  Surgeon: Elam Dutch, MD;  Location: [facility] OR;  Service: Vascular;  Laterality: Left;   AV FISTULA PLACEMENT Right 07/28/2020   Procedure: RIGHT  ARM ARTERIOVENOUS FISTULA CREATION;  Surgeon: Elam Dutch, MD;  Location: [facility];  Service: Vascular;  Laterality: Right;   COLONOSCOPY     HIP ARTHROPLASTY Left 01/30/2021   Procedure: ARTHROPLASTY BIPOLAR HIP (HEMIARTHROPLASTY);  Surgeon: Vanetta Mulders, MD;  Location: [facility];  Service: Orthopedics;  Laterality: Left;   IR AV DIALY SHUNT INTRO NEEDLE/INTRACATH INITIAL W/PTA/IMG RIGHT Right 02/09/2021   IR FLUORO GUIDE CV LINE RIGHT  02/11/2021   IR US GUIDE VASC ACCESS RIGHT  02/09/2021   IR US GUIDE VASC ACCESS RIGHT  02/11/2021   renal cyst biopsy     TONSILLECTOMY       A IV Location/Drains/Wounds Patient Lines/Drains/Airways Status     Active Line/Drains/Airways     Name Placement date Placement time Site Days   Peripheral IV 06/14/22 20 G Anterior;Left Forearm 06/14/22  1207  Forearm  less than 1   Fistula / Graft Left Other (Comment) Arteriovenous fistula 03/04/20  1020  Other (Comment)  832   Fistula / Graft Right Other (Comment) Arteriovenous fistula 07/28/20  1039  Other (Comment)  686   Fistula / Graft Right Forearm --  --  Forearm  --   Fistula / Graft Right Forearm Arteriovenous fistula --  --  Forearm  --   Hemodialysis Catheter Right Internal jugular Double lumen Permanent (Tunneled) 02/11/21  1004  Internal jugular  488   Urethral Catheter Connor RN Straight-tip 16 Fr. 04/06/22  1630  Straight-tip  69   Incision (Closed) 07/28/20 Arm Right 07/28/20  1000  -- 686   Incision (Closed) 01/30/21 Hip Left 01/30/21  1853  -- 500   Incision (Closed) 02/11/21 Neck Right 02/11/21  1018  -- 488   Wound / Incision (Open or Dehisced) 02/01/21 Arm Right;Mid;Lateral hematoma 02/01/21  --  Arm  498   Wound / Incision (Open or Dehisced) 02/11/21 Puncture Chest Right;Upper Tunneled HD catheter 02/11/21  1019   Chest  488   Wound / Incision (Open or Dehisced) 02/02/22 Skin tear Arm Left;Lower;Posterior 02/02/22  2140  Arm  132            Intake/Output Last 24 hours No intake or output data in the 24 hours ending 06/14/22 1747  Labs/Imaging Results for orders placed or performed during the hospital encounter of 06/14/22 (from the past 48 hour(s))  CBC     Status: Abnormal   Collection Time: 06/14/22 12:04 PM  Result Value Ref Range   WBC 7.5 4.0 - 10.5 K/uL   RBC 3.74 (L) 4.22 - 5.81 MIL/uL   Hemoglobin 12.5 (L) 13.0 - 17.0 g/dL   HCT 37.9 (L) 39.0 - 52.0 %   MCV 101.3 (H) 80.0 - 100.0 fL   MCH 33.4 26.0 - 34.0 pg   MCHC 33.0 30.0 - 36.0 g/dL   RDW 14.3 11.5 - 15.5 %   Platelets 236 150 - 400 K/uL   nRBC 0.0 0.0 - 0.2 %    Comment: Performed at [facility], [place] [address] [place], [place] [id number]  Basic metabolic panel     Status: Abnormal   Collection Time: 06/14/22 12:04 PM  Result Value Ref Range   Sodium 136 135 - 145 mmol/L   Potassium 3.9 3.5 - 5.1 mmol/L   Chloride 94 (L) 98 - 111 mmol/L   CO2 22 22 - 32 mmol/L   Glucose, Bld 96 70 - 99 mg/dL    Comment: Glucose reference range applies only to samples taken after fasting for at least 8 hours.   BUN 40 (H) 8 - 23 mg/dL   Creatinine, Ser 6.55 (H) 0.61 - 1.24 mg/dL   Calcium 9.3 8.9 - 10.3 mg/dL   GFR, Estimated 8 (L) >60 mL/min    Comment: (NOTE) Calculated using the CKD-EPI Creatinine Equation (2021)    Anion gap 20 (H) 5 - 15    Comment: Performed at [facility] [address], [place], [place] [address]  Resp panel by RT-PCR (RSV, Flu A&B, Covid) Anterior Nasal Swab     Status: None   Collection Time: 06/14/22 12:11 PM   Specimen: Anterior Nasal Swab  Result Value Ref Range   SARS Coronavirus 2 by RT PCR NEGATIVE NEGATIVE   Influenza A by PCR NEGATIVE NEGATIVE   Influenza B by PCR NEGATIVE NEGATIVE    Comment: (NOTE) The Xpert Xpress SARS-CoV-2/FLU/RSV plus assay is intended as an aid in the  diagnosis of influenza from Nasopharyngeal swab specimens and should not be used as a sole basis for treatment. Nasal washings and aspirates are unacceptable for Xpert Xpress SARS-CoV-2/FLU/RSV testing.  Fact Sheet for Patients: EntrepreneurPulse.com.au  Fact Sheet for Healthcare Providers: IncredibleEmployment.be  This test is not yet approved or cleared by the [place] FDA  and has been authorized for detection and/or diagnosis of SARS-CoV-2 by FDA under an Emergency Use Authorization (EUA). This EUA will remain in effect (meaning this test can be used) for the duration of the COVID-19 declaration under Section 564(b)(1) of the Act, 21 U.S.C. section 360bbb-3(b)(1), unless the authorization is terminated or revoked.     Resp Syncytial Virus by PCR NEGATIVE NEGATIVE    Comment: (NOTE) Fact Sheet for Patients: EntrepreneurPulse.com.au  Fact Sheet for Healthcare Providers: IncredibleEmployment.be  This test is not yet approved or cleared by the [place] FDA and has been authorized for detection and/or diagnosis of SARS-CoV-2 by FDA under an Emergency Use Authorization (EUA). This EUA will remain in effect (meaning this test can be used) for the duration of the COVID-19 declaration under Section 564(b)(1) of the Act, 21 U.S.C. section 360bbb-3(b)(1), unless the authorization is terminated or revoked.  Performed at [facility], [place] [address], [place], [place] [address]   Hepatitis B surface antigen     Status: None   Collection Time: 06/14/22  3:05 PM  Result Value Ref Range   Hepatitis B Surface Ag NON REACTIVE NON REACTIVE    Comment: Performed at [facility] [address], [place], [place] [address]   DG Chest Port 1 View  Result Date: 06/14/2022 CLINICAL DATA:  Cough, congestion EXAM: PORTABLE CHEST 1 VIEW COMPARISON:  02/01/2022 FINDINGS: Stable cardiomegaly. Aortic  atherosclerosis. Increased perihilar and bibasilar interstitial markings. No appreciable pleural fluid collection. No pneumothorax. Stable left upper lobe granuloma. IMPRESSION: Increased perihilar and bibasilar interstitial markings, which may reflect pulmonary edema versus atypical/viral infection. Electronically Signed   By: Davina Poke D.O.   On: 06/14/2022 12:51    Pending Labs Unresulted Labs (From admission, onward)     Start     Ordered   06/15/22 0500  CBC  Tomorrow morning,   R        06/14/22 1353   06/15/22 0500  Renal function panel  Tomorrow morning,   R        06/14/22 1353   06/14/22 1448  Hepatitis B surface antibody,quantitative  (New Admission Hemo Labs (Hepatitis B))  ONCE - URGENT,   URGENT        06/14/22 1447   06/14/22 1432  Respiratory (~20 pathogens) panel by PCR  (Respiratory panel by PCR (~20 pathogens, ~24 hr TAT)  w precautions)  Add-on,   AD        06/14/22 1431            Vitals/Pain Today's Vitals   06/14/22 1300 06/14/22 1400 06/14/22 1505 06/14/22 1700  BP: (!) 126/52 120/69  123/64  Pulse: 70 (!) 46  67  Resp: 19 18  (!) 23  Temp:   97.8 F (36.6 C)   TempSrc:   Oral   SpO2: 90% 90%  91%  Weight:      Height:      PainSc:        Isolation Precautions Droplet precaution  Medications Medications  metoprolol tartrate (LOPRESSOR) tablet 12.5 mg (has no administration in time range)  famotidine (PEPCID) tablet 20 mg (20 mg Oral Not Given 06/14/22 1431)  tamsulosin (FLOMAX) capsule 0.4 mg (0.4 mg Oral Given 06/14/22 1733)  heparin injection 5,000 Units (5,000 Units Subcutaneous Not Given 06/14/22 1435)  sodium chloride flush (NS) 0.9 % injection 3 mL (3 mLs Intravenous Given 06/14/22 1431)  acetaminophen (TYLENOL) tablet 650 mg (has no administration in time range)  Or  acetaminophen (TYLENOL) suppository 650 mg (has no administration in time range)  polyethylene glycol (MIRALAX / GLYCOLAX) packet 17 g (has no administration in time  range)  guaiFENesin (MUCINEX) 12 hr tablet 600 mg (has no administration in time range)  Chlorhexidine Gluconate Cloth 2 % PADS 6 each (has no administration in time range)  pentafluoroprop-tetrafluoroeth (GEBAUERS) aerosol 1 Application (has no administration in time range)  lidocaine (PF) (XYLOCAINE) 1 % injection 5 mL (has no administration in time range)  lidocaine-prilocaine (EMLA) cream 1 Application (has no administration in time range)  heparin injection 1,000 Units (has no administration in time range)  alteplase (CATHFLO ACTIVASE) injection 2 mg (has no administration in time range)  polyvinyl alcohol (LIQUIFILM TEARS) 1.4 % ophthalmic solution 1 drop (has no administration in time range)  calcium acetate (PHOSLO) capsule 667 mg (667 mg Oral Given 06/14/22 1732)    Mobility walks     Focused Assessments Renal Assessment Handoff:  Hemodialysis Schedule: Hemodialysis Schedule: Tuesday/Thursday/Saturday Last Hemodialysis date and time:   Restricted appendage: right arm  , Pulmonary Assessment Handoff:  Lung sounds:   O2 Device: Nasal Cannula O2 Flow Rate (L/min): 2 L/min    R Recommendations: See Admitting Provider Note  Report given to:   Additional Notes:

## 2022-06-14 NOTE — Consult Note (Signed)
[facility] Renal Consultation Note    Indication for Consultation:  Management of ESRD/hemodialysis; anemia, hypertension/volume and secondary hyperparathyroidism  AS:[id number], Beryle Lathe, FNP  HPI: Jeffrey Payne is a 89 y.o. male with ESRD on HD TTS at [facility]. He has a past medical history significant for waldenstrm's microalbuminemia, postinflammatory pulmonary fibrosis, bradycardia, anemia, combined systolic and diastolic CHF, MAT, hypertension, hyperlipidemia, GERD, and BPH who presents to the ED c/o shortness and breath and cough and noted to be hypoxic. Noted he was seen by his PCP earlier today and his O2 saturation was around 88%. Patient was then sent to the ED for further evaluation.  Seen and examined patient at bedside. His last hemodialysis was on 06/10/22. He missed treatment on Saturday because he wasn't feeling well (c/o cough and SOB). He endorses SOB when taking deep breaths ans with exertion. He denies fevers, chills, CP, ABD pain, and N/V. CXR shows increased perihilar and bibasilar interstitial markings consistent with pulmonary edema vs. Infection. Recent BMP and CBC are stable. He is negative for COVID, influenza, and RSV. He remains on RA and BP is currently controlled. Plan for HD this evening.  Past Medical History:  Diagnosis Date   Anemia    Arthritis    knees   Cancer [facility])    prostate   ED (erectile dysfunction)    GERD (gastroesophageal reflux disease)    ocassional    Hard of hearing    Hypertension    Hypertriglyceridemia    Renal failure    Dialysis dependent   Past Surgical History:  Procedure Laterality Date   A/V FISTULAGRAM N/A 07/25/2020   Procedure: A/V FISTULAGRAM - Left Arm;  Surgeon: Elam Dutch, MD;  Location: [place] [facility];  Service: Cardiovascular;  Laterality: N/A;   AV FISTULA PLACEMENT Left 03/04/2020   Procedure: BRACHIOCEPHALIC ARTERIOVENOUS (AV) FISTULA CREATION LEFT;  Surgeon: Elam Dutch,  MD;  Location: [facility] OR;  Service: Vascular;  Laterality: Left;   AV FISTULA PLACEMENT Right 07/28/2020   Procedure: RIGHT  ARM ARTERIOVENOUS FISTULA CREATION;  Surgeon: Elam Dutch, MD;  Location: [place];  Service: Vascular;  Laterality: Right;   COLONOSCOPY     HIP ARTHROPLASTY Left 01/30/2021   Procedure: ARTHROPLASTY BIPOLAR HIP (HEMIARTHROPLASTY);  Surgeon: Vanetta Mulders, MD;  Location: [place];  Service: Orthopedics;  Laterality: Left;   IR AV DIALY SHUNT INTRO NEEDLE/INTRACATH INITIAL W/PTA/IMG RIGHT Right 02/09/2021   IR FLUORO GUIDE CV LINE RIGHT  02/11/2021   IR US GUIDE VASC ACCESS RIGHT  02/09/2021   IR US GUIDE VASC ACCESS RIGHT  02/11/2021   renal cyst biopsy     TONSILLECTOMY     Family History  Problem Relation Age of Onset   Cancer Mother    Social History:  reports that he has never smoked. He has never used smokeless tobacco. He reports current alcohol use of about 14.0 standard drinks of alcohol per week. He reports that he does not use drugs. No Known Allergies Prior to Admission medications   Medication Sig Start Date End Date Taking? Authorizing Provider  acetaminophen (TYLENOL) 650 MG CR tablet Take 650 mg by mouth every 8 (eight) hours as needed for pain.    [provider]  benzonatate (TESSALON) 100 MG capsule Take 1 capsule (100 mg total) by mouth 3 (three) times daily. 02/04/22   Nita Sells, MD  calcium acetate (PHOSLO) 667 MG tablet Take 667 mg by mouth 3 (three) times daily with meals.  12/16/21   [provider]  carboxymethylcellulose (REFRESH PLUS) 0.5 % SOLN Place 1 drop into both eyes daily as needed (dry eyes).    [provider]  cetirizine (ZYRTEC) 10 MG tablet Take 10 mg by mouth daily.    [provider]  famotidine (PEPCID) 20 MG tablet Take 1 tablet (20 mg total) by mouth daily. 02/04/22   Nita Sells, MD  fluticasone (FLONASE) 50 MCG/ACT nasal spray Place 2 sprays into both nostrils daily as  needed for allergies or rhinitis.    [provider]  lactose free nutrition (BOOST) LIQD Take 237 mLs by mouth daily as needed (for nutrition).    [provider]  metoprolol tartrate (LOPRESSOR) 25 MG tablet Take 0.5 tablets (12.5 mg total) by mouth 3 (three) times daily. 02/10/21   Nita Sells, MD  sevelamer carbonate (RENVELA) 800 MG tablet Take 1 tablet (800 mg total) by mouth 3 (three) times daily with meals. Patient not taking: Reported on 02/02/2022 02/10/21   Nita Sells, MD  tamsulosin (FLOMAX) 0.4 MG CAPS capsule Take 1 capsule (0.4 mg total) by mouth daily after supper. 10/09/19   Patrecia Pour, MD   Current Facility-Administered Medications  Medication Dose Route Frequency Provider Last Rate Last Admin   acetaminophen (TYLENOL) tablet 650 mg  650 mg Oral Q6H PRN Marcelyn Bruins, MD       Or   acetaminophen (TYLENOL) suppository 650 mg  650 mg Rectal Q6H PRN Marcelyn Bruins, MD       alteplase (CATHFLO ACTIVASE) injection 2 mg  2 mg Intracatheter Once PRN Adelfa Koh, NP       calcium acetate (PHOSLO) tablet 667 mg  667 mg Oral TID WC Marcelyn Bruins, MD       carboxymethylcellulose (REFRESH PLUS) 0.5 % ophthalmic solution 1 drop  1 drop Both Eyes Daily PRN Marcelyn Bruins, MD       [START ON 06/15/2022] Chlorhexidine Gluconate Cloth 2 % PADS 6 each  6 each Topical Q0600 Adelfa Koh, NP       famotidine (PEPCID) tablet 20 mg  20 mg Oral Daily Marcelyn Bruins, MD       guaiFENesin (MUCINEX) 12 hr tablet 600 mg  600 mg Oral BID PRN Marcelyn Bruins, MD       heparin injection 1,000 Units  1,000 Units Dialysis PRN Adelfa Koh, NP       heparin injection 5,000 Units  5,000 Units Subcutaneous Q8H Marcelyn Bruins, MD       lidocaine (PF) (XYLOCAINE) 1 % injection 5 mL  5 mL Intradermal PRN Adelfa Koh, NP       lidocaine-prilocaine (EMLA) cream 1 Application  1 Application Topical PRN Adelfa Koh, NP       [START ON 06/15/2022] metoprolol tartrate (LOPRESSOR) tablet 12.5 mg  12.5 mg Oral TID Marcelyn Bruins, MD       pentafluoroprop-tetrafluoroeth (GEBAUERS) aerosol 1 Application  1 Application Topical PRN Adelfa Koh, NP       polyethylene glycol (MIRALAX / GLYCOLAX) packet 17 g  17 g Oral Daily PRN Marcelyn Bruins, MD       sodium chloride flush (NS) 0.9 % injection 3 mL  3 mL Intravenous Q12H Marcelyn Bruins, MD   3 mL at 06/14/22 1431   tamsulosin (FLOMAX) capsule 0.4 mg  0.4 mg Oral QPC supper Marcelyn Bruins, MD  Current Outpatient Medications  Medication Sig Dispense Refill   acetaminophen (TYLENOL) 650 MG CR tablet Take 650 mg by mouth every 8 (eight) hours as needed for pain.     benzonatate (TESSALON) 100 MG capsule Take 1 capsule (100 mg total) by mouth 3 (three) times daily. 20 capsule 0   calcium acetate (PHOSLO) 667 MG tablet Take 667 mg by mouth 3 (three) times daily with meals.     carboxymethylcellulose (REFRESH PLUS) 0.5 % SOLN Place 1 drop into both eyes daily as needed (dry eyes).     cetirizine (ZYRTEC) 10 MG tablet Take 10 mg by mouth daily.     famotidine (PEPCID) 20 MG tablet Take 1 tablet (20 mg total) by mouth daily. 30 tablet 0   fluticasone (FLONASE) 50 MCG/ACT nasal spray Place 2 sprays into both nostrils daily as needed for allergies or rhinitis.     lactose free nutrition (BOOST) LIQD Take 237 mLs by mouth daily as needed (for nutrition).     metoprolol tartrate (LOPRESSOR) 25 MG tablet Take 0.5 tablets (12.5 mg total) by mouth 3 (three) times daily.     sevelamer carbonate (RENVELA) 800 MG tablet Take 1 tablet (800 mg total) by mouth 3 (three) times daily with meals. (Patient not taking: Reported on 02/02/2022)     tamsulosin (FLOMAX) 0.4 MG CAPS capsule Take 1 capsule (0.4 mg total) by mouth daily after supper. 30 capsule 0   Labs: Basic Metabolic Panel: Recent Labs  Lab 06/14/22 1204  NA 136  K 3.9  CL 94*   CO2 22  GLUCOSE 96  BUN 40*  CREATININE 6.55*  CALCIUM 9.3   Liver Function Tests: No results for input(s): "AST", "ALT", "ALKPHOS", "BILITOT", "PROT", "ALBUMIN" in the last 168 hours. No results for input(s): "LIPASE", "AMYLASE" in the last 168 hours. No results for input(s): "AMMONIA" in the last 168 hours. CBC: Recent Labs  Lab 06/14/22 1204  WBC 7.5  HGB 12.5*  HCT 37.9*  MCV 101.3*  PLT 236   Cardiac Enzymes: No results for input(s): "CKTOTAL", "CKMB", "CKMBINDEX", "TROPONINI" in the last 168 hours. CBG: No results for input(s): "GLUCAP" in the last 168 hours. Iron Studies: No results for input(s): "IRON", "TIBC", "TRANSFERRIN", "FERRITIN" in the last 72 hours. Studies/Results: DG Chest Port 1 View  Result Date: 06/14/2022 CLINICAL DATA:  Cough, congestion EXAM: PORTABLE CHEST 1 VIEW COMPARISON:  02/01/2022 FINDINGS: Stable cardiomegaly. Aortic atherosclerosis. Increased perihilar and bibasilar interstitial markings. No appreciable pleural fluid collection. No pneumothorax. Stable left upper lobe granuloma. IMPRESSION: Increased perihilar and bibasilar interstitial markings, which may reflect pulmonary edema versus atypical/viral infection. Electronically Signed   By: Davina Poke D.O.   On: 06/14/2022 12:51    ROS: All others negative except those listed in HPI.   Physical Exam: Vitals:   06/14/22 1201 06/14/22 1205 06/14/22 1300  BP: (!) 120/54  (!) 126/52  Pulse: 80  70  Resp: 20  19  Temp: (!) 97.5 F (36.4 C)    TempSrc: Oral    SpO2: 95%  90%  Weight:  69 kg   Height:  5' 7"$  (1.702 m)      General: Awake, alert, on RA, NAD Neck: Supple. No JVD Lungs: (+) fine rales and congestion LLL; mildly labored with exertion Heart: RRR. No murmur, rubs or gallops.  Abdomen: soft, non-tender Lower extremities: no LE edema Neuro: AAOx3. Moves all extremities spontaneously. Dialysis Access: R AVF (+) B/T  Dialysis Orders:  TTS - [facility]  3hrs,  BFR 400, DFR Auto 1.5,  EDW 56.3kg , 2K/ 2Ca Heparin 4000 unit bolus No ESA is indicated Calcitriol 0.92mg PO qHD - last dose 06/10/22  Assessment/Plan: Acute on chronic CHF/Acute hypoxic resp failure - missed HD on 2/16. Auscultated fine rales and congestion on exam. CXR consistent for pulmonary edema vs. Infection. Plan for HD this evening.  ESRD - on HD TTS; off schedule for now. Will receive HD this evening. Will assess him in AM and plan to place him back on his routine schedule this week. UFG set 1-2L today. Hypertension/volume  - Doesn't appear in severe overload, blood pressures controlled. Anemia of CKD - Hgb >12.5; no indication for ESA/Fe at this time Secondary Hyperparathyroidism -  Ca ok and will add PO4 in AM. Nutrition - Renal diet with fluid restriction  CTobie Poet NP [facility] 06/14/2022, 3:07 PM

## 2022-06-14 NOTE — ED Triage Notes (Signed)
Pt BIB [facility] from doctor's office due to SpO2 of 88% on room air.  Pt reports he has been having cough and congestion for the past 5 days.  Hx A-fib and dialysis pt.  Tuesday, Thursday and Saturday.  Pt did miss one day last week to not feeling well.  VSS.

## 2022-06-14 NOTE — H&P (Signed)
History and Physical   Jeffrey Payne [id number] DOB: 03-18-1933 DOA: 06/14/2022  PCP: Jeffrey Loader, FNP   Patient coming from: PCP  Chief Complaint: Hypoxia  HPI: Jeffrey Payne is a 89 y.o. male with medical history significant of Waldenstrm's microalbuminemia, postinflammatory pulmonary fibrosis, ESRD on HD, bradycardia, anemia, combined systolic and diastolic CHF, MAT, hypertension, hyperlipidemia, GERD, BPH presenting with hypoxia.  Patient was seen in his PCP office today and noted to be desaturating to 88% and sent to the ED for further evaluation.  Is a dialysis patient typically dialyzes Tuesday Thursday Saturday but missed Thursday due to feeling unwell with cough, congestion x 5 days.  Today he feels better but some residual symptoms.  He also states that he had a medication for his PCP which really helped his cough.  In addition he states they went out to dinner for his wife's birthday last night and he may have overdone it.  He denies fevers, chills, chest pain, abdominal pain, constipation, diarrhea, nausea, vomiting.  ED Course: Vital signs in the ED significant for blood pressure in the [id number] systolic saturating 87 to 90% on room air.  Lab workup included BMP with chloride 90, BUN 40, creatinine 6.53, anion gap 20.  CBC with hemoglobin stable at 12.5.  Respiratory panel for flu COVID RSV negative.  Chest x-ray showed increased interstitial markings suspicious for pulmonary edema versus atypical infection.  Nephrology consulted in the ED and are waiting to hear back.  Review of Systems: As per HPI otherwise all other systems reviewed and are negative.  Past Medical History:  Diagnosis Date   Anemia    Arthritis    knees   Cancer [facility])    prostate   ED (erectile dysfunction)    GERD (gastroesophageal reflux disease)    ocassional    Hard of hearing    Hypertension    Hypertriglyceridemia    Renal failure    Dialysis dependent    Past Surgical History:   Procedure Laterality Date   A/V FISTULAGRAM N/A 07/25/2020   Procedure: A/V FISTULAGRAM - Left Arm;  Surgeon: Jeffrey Dutch, MD;  Location: [place] [facility];  Service: Cardiovascular;  Laterality: N/A;   AV FISTULA PLACEMENT Left 03/04/2020   Procedure: BRACHIOCEPHALIC ARTERIOVENOUS (AV) FISTULA CREATION LEFT;  Surgeon: Jeffrey Dutch, MD;  Location[facility] OR;  Service: Vascular;  Laterality: Left;   AV FISTULA PLACEMENT Right 07/28/2020   Procedure: RIGHT  ARM ARTERIOVENOUS FISTULA CREATION;  Surgeon: Jeffrey Dutch, MD;  Location: [place];  Service: Vascular;  Laterality: Right;   COLONOSCOPY     HIP ARTHROPLASTY Left 01/30/2021   Procedure: ARTHROPLASTY BIPOLAR HIP (HEMIARTHROPLASTY);  Surgeon: Jeffrey Mulders, MD;  Location: [place];  Service: Orthopedics;  Laterality: Left;   IR AV DIALY SHUNT INTRO NEEDLE/INTRACATH INITIAL W/PTA/IMG RIGHT Right 02/09/2021   IR FLUORO GUIDE CV LINE RIGHT  02/11/2021   IR US GUIDE VASC ACCESS RIGHT  02/09/2021   IR US GUIDE VASC ACCESS RIGHT  02/11/2021   renal cyst biopsy     TONSILLECTOMY      Social History  reports that he has never smoked. He has never used smokeless tobacco. He reports current alcohol use of about 14.0 standard drinks of alcohol per week. He reports that he does not use drugs.  No Known Allergies  Family History  Problem Relation Age of Onset   Cancer Mother   Reviewed on admission  Prior to Admission medications   Medication Sig Start  Date End Date Taking? Authorizing Provider  acetaminophen (TYLENOL) 650 MG CR tablet Take 650 mg by mouth every 8 (eight) hours as needed for pain.    [provider]  benzonatate (TESSALON) 100 MG capsule Take 1 capsule (100 mg total) by mouth 3 (three) times daily. 02/04/22   Jeffrey Sells, MD  calcium acetate (PHOSLO) 667 MG tablet Take 667 mg by mouth 3 (three) times daily with meals. 12/16/21   [provider]  carboxymethylcellulose (REFRESH PLUS) 0.5 % SOLN  Place 1 drop into both eyes daily as needed (dry eyes).    [provider]  cetirizine (ZYRTEC) 10 MG tablet Take 10 mg by mouth daily.    [provider]  famotidine (PEPCID) 20 MG tablet Take 1 tablet (20 mg total) by mouth daily. 02/04/22   Jeffrey Sells, MD  fluticasone (FLONASE) 50 MCG/ACT nasal spray Place 2 sprays into both nostrils daily as needed for allergies or rhinitis.    [provider]  lactose free nutrition (BOOST) LIQD Take 237 mLs by mouth daily as needed (for nutrition).    [provider]  metoprolol tartrate (LOPRESSOR) 25 MG tablet Take 0.5 tablets (12.5 mg total) by mouth 3 (three) times daily. 02/10/21   Jeffrey Sells, MD  sevelamer carbonate (RENVELA) 800 MG tablet Take 1 tablet (800 mg total) by mouth 3 (three) times daily with meals. Patient not taking: Reported on 02/02/2022 02/10/21   Jeffrey Sells, MD  tamsulosin (FLOMAX) 0.4 MG CAPS capsule Take 1 capsule (0.4 mg total) by mouth daily after supper. 10/09/19   Jeffrey Pour, MD    Physical Exam: Vitals:   06/14/22 1201 06/14/22 1205 06/14/22 1300  BP: (!) 120/54  (!) 126/52  Pulse: 80  70  Resp: 20  19  Temp: (!) 97.5 F (36.4 C)    TempSrc: Oral    SpO2: 95%  90%  Weight:  69 kg   Height:  5' 7"$  (1.702 m)     Physical Exam Constitutional:      General: He is not in acute distress.    Appearance: Normal appearance.  HENT:     Head: Normocephalic and atraumatic.     Mouth/Throat:     Mouth: Mucous membranes are moist.     Pharynx: Oropharynx is clear.  Eyes:     Extraocular Movements: Extraocular movements intact.     Pupils: Pupils are equal, round, and reactive to light.  Cardiovascular:     Rate and Rhythm: Normal rate and regular rhythm.     Pulses: Normal pulses.     Heart sounds: Normal heart sounds.  Pulmonary:     Effort: Pulmonary effort is normal. No respiratory distress.     Breath sounds: Rales present.  Abdominal:      General: Bowel sounds are normal. There is no distension.     Palpations: Abdomen is soft.     Tenderness: There is no abdominal tenderness.  Musculoskeletal:        General: No swelling or deformity.     Right lower leg: No edema.     Left lower leg: No edema.  Skin:    General: Skin is warm and dry.  Neurological:     General: No focal deficit present.     Mental Status: Mental status is at baseline.    Labs on Admission: I have personally reviewed following labs and imaging studies  CBC: Recent Labs  Lab 06/14/22 1204  WBC 7.5  HGB 12.5*  HCT 37.9*  MCV 101.3*  PLT [id number]    Basic Metabolic Panel: Recent Labs  Lab 06/14/22 1204  NA 136  K 3.9  CL 94*  CO2 22  GLUCOSE 96  BUN 40*  CREATININE 6.55*  CALCIUM 9.3    GFR: Estimated Creatinine Clearance: 7.4 mL/min (A) (by C-G formula based on SCr of 6.55 mg/dL (H)).  Liver Function Tests: No results for input(s): "AST", "ALT", "ALKPHOS", "BILITOT", "PROT", "ALBUMIN" in the last 168 hours.  Urine analysis:    Component Value Date/Time   COLORURINE YELLOW 04/06/2022 [place] 04/06/2022 1554   LABSPEC 1.010 04/06/2022 1554   PHURINE 7.5 04/06/2022 1554   GLUCOSEU NEGATIVE 04/06/2022 1554   HGBUR TRACE (A) 04/06/2022 1554   BILIRUBINUR NEGATIVE 04/06/2022 [place] 04/06/2022 1554   PROTEINUR 100 (A) 04/06/2022 1554   NITRITE NEGATIVE 04/06/2022 1554   LEUKOCYTESUR NEGATIVE 04/06/2022 1554    Radiological Exams on Admission: DG Chest Port 1 View  Result Date: 06/14/2022 CLINICAL DATA:  Cough, congestion EXAM: PORTABLE CHEST 1 VIEW COMPARISON:  02/01/2022 FINDINGS: Stable cardiomegaly. Aortic atherosclerosis. Increased perihilar and bibasilar interstitial markings. No appreciable pleural fluid collection. No pneumothorax. Stable left upper lobe granuloma. IMPRESSION: Increased perihilar and bibasilar interstitial markings, which may reflect pulmonary edema versus atypical/viral  infection. Electronically Signed   By: Davina Poke D.O.   On: 06/14/2022 12:51    EKG: Independently reviewed.  Sinus rhythm at 77 bpm.  Left bundle branch block with repolarization abnormality.  Nonspecific T wave changes.  Left bundle branch block is similar to previous but not all prior EKGs.  Assessment/Plan Active Problems:   End stage renal disease on dialysis ([place])   Hypertension   Anemia of chronic kidney failure   BPH with urinary obstruction   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HCC)   Postinflammatory pulmonary fibrosis (HCC)   Gastroesophageal reflux disease   Hyperlipidemia   Multifocal atrial tachycardia   Acute respiratory failure with hypoxia (HCC)   ESRD Acute hypoxic respiratory failure Acute on chronic chronic systolic and diastolic CHF > Has had some cough and congestion for the last 5 days currently feels better with some residual symptoms.  Due to feeling unwell he did miss dialysis on Thursday, but did go to Saturday session > This is also in the setting of known CHF with last echo in 2020 with EF 35-40%, G1 DD, normal RV function. > Went to PCP today noted to have hypoxia to 88% on room air which reveals redemonstrated here.  Concern for possible volume overload as he has had this issue in the past. > Chest x-ray consistent with pulmonary edema versus atypical infection.  Negative for flu COVID and RSV.  Rales on exam. > Nephrology consulted in the ED, waiting to hear back. - Appreciate nephrology recommendations - Monitor on telemetry - Dialysis if indicated by nephrology - RVP to rule viral component - Supplemental oxygen, wean as tolerated  Hypertension History of MAT > EKG with sinus rhythm.  In the room monitor appear to be intermittently in atrial fibrillation. - Continue home metoprolol  GERD - Continue home Pepcid  BPH - Continue home tamsulosin  Anemia > Hemoglobin 12.5 - Stable from previous, trend CBC  History of possible  Waldenstrm's macroglobulinemia History of postinflammatory pulmonary fibrosis (I see no detailed record on search) - Noted  DVT prophylaxis: Heparin Code Status:   Full Family Communication:  None on admission. Disposition Plan:   Patient is from:  Home  Anticipated DC to:  Home  Anticipated DC date:  1 to 2 days  Anticipated DC barriers: None  Consults called:  Nephrology consulted in the ED Admission status:  Observation, telemetry  Severity of Illness: The appropriate patient status for this patient is OBSERVATION. Observation status is judged to be reasonable and necessary in order to provide the required intensity of service to ensure the patient's safety. The patient's presenting symptoms, physical exam findings, and initial radiographic and laboratory data in the context of their medical condition is felt to place them at decreased risk for further clinical deterioration. Furthermore, it is anticipated that the patient will be medically stable for discharge from the hospital within 2 midnights of admission.    Marcelyn Bruins MD [facility]  How to contact the [facility] Attending or Consulting provider [place] or covering provider during after hours [employer], for this patient?   Check the care team in [facility] and look for a) attending/consulting [facility] provider listed and b) the [facility] team listed Log into [email or web address] and use 's universal password to access. If you do not have the password, please contact the hospital operator. Locate the [facility] provider you are looking for under [facility] and page to a number that you can be directly reached. If you still have difficulty reaching the provider, please page the [facility] (Director on Call) for the Hospitalists listed on amion for assistance.  06/14/2022, 1:53 PM

## 2022-06-14 NOTE — ED Notes (Signed)
Pt c/o increased burping and belching.  Sts he's not very hungry.

## 2022-06-14 NOTE — Progress Notes (Signed)
Pre HD Tx   06/14/22 2133  Vitals  Temp 98.1 F (36.7 C)  Pulse Rate 82  Resp (!) 24  BP 123/76  SpO2 96 %  O2 Device Nasal Cannula  Type of Weight Pre-Dialysis  Oxygen Therapy  O2 Flow Rate (L/min) 2 L/min  Pre Treatment  Is pt a NEW START this admission?  No  Vascular access used during treatment Fistula  Patient is receiving dialysis in a chair No  Hemodialysis Consent Verified Yes  Hemodialysis Standing Orders Initiated Yes  ECG (Telemetry) Monitor On Yes  Prime Ordered Normal Saline  Length of  DialysisTreatment -hour(s) 3 Hour(s)  Dialysis mode HD  Dialyzer Revaclear 400  Dialysate 2K  Dialysis Anticoagulation Automated NS Flushes  Dialysate Flow Ordered 300  Blood Flow Rate Ordered 400 mL/min  Ultrafiltration Goal 2000 Liters  Dialysis Blood Pressure Support Ordered Normal Saline

## 2022-06-14 NOTE — ED Notes (Signed)
All belongings transported w/ Pt.

## 2022-06-14 NOTE — ED Provider Notes (Signed)
[facility] Provider Note   CSN: [id number] Arrival date & time: 06/14/22  1149     History  Chief Complaint  Patient presents with   Shortness of Breath    Jeffrey Payne is a 89 y.o. male.  HPI   89 year old male history of renal failure, on dialysis Tuesday Thursday Saturday missed dialysis on Thursday, he has had some cough and congestion and increased dyspnea over the past week.  He was seen in his primary care doctor's office today and noted to have sats down to 88% sent to the ED for further evaluation.  Patient states he began having a productive cough a week ago.  He was seen by his physician states that he felt better the next day but then again felt worse  Home Medications Prior to Admission medications   Medication Sig Start Date End Date Taking? Authorizing Provider  acetaminophen (TYLENOL) 650 MG CR tablet Take 650 mg by mouth every 8 (eight) hours as needed for pain.    [provider]  benzonatate (TESSALON) 100 MG capsule Take 1 capsule (100 mg total) by mouth 3 (three) times daily. 02/04/22   Nita Sells, MD  calcium acetate (PHOSLO) 667 MG tablet Take 667 mg by mouth 3 (three) times daily with meals. 12/16/21   [provider]  carboxymethylcellulose (REFRESH PLUS) 0.5 % SOLN Place 1 drop into both eyes daily as needed (dry eyes).    [provider]  cetirizine (ZYRTEC) 10 MG tablet Take 10 mg by mouth daily.    [provider]  famotidine (PEPCID) 20 MG tablet Take 1 tablet (20 mg total) by mouth daily. 02/04/22   Nita Sells, MD  fluticasone (FLONASE) 50 MCG/ACT nasal spray Place 2 sprays into both nostrils daily as needed for allergies or rhinitis.    [provider]  lactose free nutrition (BOOST) LIQD Take 237 mLs by mouth daily as needed (for nutrition).    [provider]  metoprolol tartrate (LOPRESSOR) 25 MG tablet Take 0.5 tablets (12.5 mg  total) by mouth 3 (three) times daily. 02/10/21   Nita Sells, MD  sevelamer carbonate (RENVELA) 800 MG tablet Take 1 tablet (800 mg total) by mouth 3 (three) times daily with meals. Patient not taking: Reported on 02/02/2022 02/10/21   Nita Sells, MD  tamsulosin (FLOMAX) 0.4 MG CAPS capsule Take 1 capsule (0.4 mg total) by mouth daily after supper. 10/09/19   Patrecia Pour, MD      Allergies    Patient has no known allergies.    Review of Systems   Review of Systems  Physical Exam Updated Vital Signs BP (!) 126/52   Pulse 70   Temp (!) 97.5 F (36.4 C) (Oral)   Resp 19   Ht 1.702 m (5' 7"$ )   Wt 69 kg   SpO2 90%   BMI 23.82 kg/m  Physical Exam Vitals and nursing note reviewed.  Constitutional:      Appearance: He is ill-appearing.  HENT:     Head: Normocephalic.     Mouth/Throat:     Comments: Discussed membranes appear dry Eyes:     Pupils: Pupils are equal, round, and reactive to light.  Cardiovascular:     Rate and Rhythm: Normal rate and regular rhythm.  Pulmonary:     Effort: Tachypnea present.     Breath sounds: Examination of the right-lower field reveals decreased breath sounds. Examination of the left-lower field reveals decreased breath sounds.  Decreased breath sounds present.  Musculoskeletal:        General: Normal range of motion.     Cervical back: Normal range of motion.     Right lower leg: No edema.     Left lower leg: No edema.     Comments: Contusion abrasion to left knee Dressing in place left hand patient reports recent skin cancer surgery Dressing in place left hand  Skin:    General: Skin is warm.     Capillary Refill: Capillary refill takes less than 2 seconds.  Neurological:     General: No focal deficit present.     Mental Status: He is alert.     ED Results / Procedures / Treatments   Labs (all labs ordered are listed, but only abnormal results are displayed) Labs Reviewed  CBC - Abnormal; Notable for the  following components:      Result Value   RBC 3.74 (*)    Hemoglobin 12.5 (*)    HCT 37.9 (*)    MCV 101.3 (*)    All other components within normal limits  BASIC METABOLIC PANEL - Abnormal; Notable for the following components:   Chloride 94 (*)    BUN 40 (*)    Creatinine, Ser 6.55 (*)    GFR, Estimated 8 (*)    Anion gap 20 (*)    All other components within normal limits  RESP PANEL BY RT-PCR (RSV, FLU A&B, COVID)  [id number]    EKG EKG Interpretation  Date/Time:  Monday June 14 2022 12:14:37 EST Ventricular Rate:  77 PR Interval:  188 QRS Duration: 138 QT Interval:  461 QTC Calculation: 522 R Axis:   -49 Text Interpretation: Sinus rhythm Atrial premature complex Left bundle branch block Confirmed by Pattricia Boss [phone]) on 06/14/2022 1:22:25 PM  Radiology DG Chest Port 1 View  Result Date: 06/14/2022 CLINICAL DATA:  Cough, congestion EXAM: PORTABLE CHEST 1 VIEW COMPARISON:  02/01/2022 FINDINGS: Stable cardiomegaly. Aortic atherosclerosis. Increased perihilar and bibasilar interstitial markings. No appreciable pleural fluid collection. No pneumothorax. Stable left upper lobe granuloma. IMPRESSION: Increased perihilar and bibasilar interstitial markings, which may reflect pulmonary edema versus atypical/viral infection. Electronically Signed   By: Davina Poke D.O.   On: 06/14/2022 12:51    Procedures Procedures    Medications Ordered in ED Medications  metoprolol tartrate (LOPRESSOR) tablet 12.5 mg (has no administration in time range)  famotidine (PEPCID) tablet 20 mg (has no administration in time range)  calcium acetate (PHOSLO) tablet 667 mg (has no administration in time range)  tamsulosin (FLOMAX) capsule 0.4 mg (has no administration in time range)  carboxymethylcellulose (REFRESH PLUS) 0.5 % ophthalmic solution 1 drop (has no administration in time range)  heparin injection 5,000 Units (has no administration in time range)  sodium chloride flush (NS) 0.9  % injection 3 mL (has no administration in time range)  acetaminophen (TYLENOL) tablet 650 mg (has no administration in time range)    Or  acetaminophen (TYLENOL) suppository 650 mg (has no administration in time range)  polyethylene glycol (MIRALAX / GLYCOLAX) packet 17 g (has no administration in time range)    ED Course/ Medical Decision Making/ A&P                             Medical Decision Making Amount and/or Complexity of Data Reviewed Labs: ordered. Radiology: ordered.  Risk Decision regarding hospitalization.   This patient presents to the ED for  concern of shortness of breath, this involves an extensive number of treatment options, and is a complaint that carries with it a high risk of complications and morbidity.  The differential diagnosis includes volume overload, lung infection, coronary artery disease, demand ischemia, pulmonary embolism, other intrapulmonary etiologies   Co morbidities that complicate the patient evaluation  Patient with known history of kidney failure on dialysis Tuesday Thursday Saturday missed his last dialysis   Additional history obtained:  Additional history obtained from was seen in his primary care office today and was sent to the ED due to decreased saturations and respiratory distress External records from outside source obtained and reviewed including reviewed last discharge summary   Lab Tests:  I Ordered, and personally interpreted labs.  The pertinent results include: CBC reviewed interpreted and within normal limits COVID and flu and RSV reviewed and negative   Imaging Studies ordered:  I ordered imaging studies including x-Rochell Mabie reviewed interpreted no evidence of acute abnormalities are noted I independently visualized and interpreted imaging which showed no acute abnormalities I agree with the radiologist interpretation   Cardiac Monitoring: / EKG:  The patient was maintained on a cardiac monitor.  I personally viewed  and interpreted the cardiac monitored which showed an underlying rhythm of: EKG with left bundle branch block   Consultations Obtained:  I requested consultation with the hospitalist and nephrologist,  and discussed lab and imaging findings as well as pertinent plan - they recommend: dialysis and admission Discussed with Dr. Trilby Drummer and Dr.Schertz  Problem List / ED Course / Critical interventions / Medication management  Dyspnea Hypoxia Nonocompliance with dialysis Oxygen started with some improvement Reevaluation of the patient after these medicines showed that the patient improved I have reviewed the patients home medicines and have made adjustments as needed   Social Determinants of Health:  No significant risk factors on file   Test / Admission - Considered:  Chest x-Quisha Mabie and labs obtained         Final Clinical Impression(s) / ED Diagnoses Final diagnoses:  Dyspnea, unspecified type  Noncompliance with renal dialysis    Rx / DC Orders ED Discharge Orders     None         Pattricia Boss, MD 06/14/22 1401

## 2022-06-15 ENCOUNTER — Other Ambulatory Visit: Payer: Self-pay

## 2022-06-15 DIAGNOSIS — I10 Essential (primary) hypertension: Secondary | ICD-10-CM | POA: Diagnosis not present

## 2022-06-15 DIAGNOSIS — R059 Cough, unspecified: Secondary | ICD-10-CM

## 2022-06-15 DIAGNOSIS — N186 End stage renal disease: Secondary | ICD-10-CM | POA: Diagnosis not present

## 2022-06-15 DIAGNOSIS — Z992 Dependence on renal dialysis: Secondary | ICD-10-CM | POA: Diagnosis not present

## 2022-06-15 DIAGNOSIS — J9601 Acute respiratory failure with hypoxia: Secondary | ICD-10-CM | POA: Diagnosis not present

## 2022-06-15 LAB — CBC
HCT: 34.6 % — ABNORMAL LOW (ref 39.0–52.0)
Hemoglobin: 11.9 g/dL — ABNORMAL LOW (ref 13.0–17.0)
MCH: 33.7 pg (ref 26.0–34.0)
MCHC: 34.4 g/dL (ref 30.0–36.0)
MCV: 98 fL (ref 80.0–100.0)
Platelets: 232 10*3/uL (ref 150–400)
RBC: 3.53 MIL/uL — ABNORMAL LOW (ref 4.22–5.81)
RDW: 14.3 % (ref 11.5–15.5)
WBC: 8.5 10*3/uL (ref 4.0–10.5)
nRBC: 0 % (ref 0.0–0.2)

## 2022-06-15 LAB — RESPIRATORY PANEL BY PCR

## 2022-06-15 LAB — RENAL FUNCTION PANEL
Albumin: 2.8 g/dL — ABNORMAL LOW (ref 3.5–5.0)
Anion gap: 17 — ABNORMAL HIGH (ref 5–15)
BUN: 15 mg/dL (ref 8–23)
CO2: 26 mmol/L (ref 22–32)
Calcium: 9 mg/dL (ref 8.9–10.3)
Chloride: 95 mmol/L — ABNORMAL LOW (ref 98–111)
Creatinine, Ser: 3.58 mg/dL — ABNORMAL HIGH (ref 0.61–1.24)
GFR, Estimated: 16 mL/min — ABNORMAL LOW (ref >60)
Glucose, Bld: 75 mg/dL (ref 70–99)
Phosphorus: 2.9 mg/dL (ref 2.5–4.6)
Potassium: 3.1 mmol/L — ABNORMAL LOW (ref 3.5–5.1)
Sodium: 138 mmol/L (ref 135–145)

## 2022-06-15 LAB — HEPATITIS B SURFACE ANTIBODY, QUANTITATIVE: Hep B S AB Quant (Post): <3.1 m[IU]/mL — ABNORMAL LOW (ref >9.9)

## 2022-06-15 MED ORDER — POTASSIUM CHLORIDE 20 MEQ PO PACK
40.0000 meq | PACK | Freq: Once | ORAL | Status: AC
  Administered 2022-06-15: 40 meq via ORAL
  Filled 2022-06-15: qty 2

## 2022-06-15 MED ORDER — GUAIFENESIN ER 600 MG PO TB12: 1200.0000 mg | ORAL_TABLET | Freq: Two times a day (BID) | ORAL | 0 refills | Status: AC

## 2022-06-15 MED ORDER — GUAIFENESIN ER 600 MG PO TB12
1200.0000 mg | ORAL_TABLET | Freq: Two times a day (BID) | ORAL | Status: DC
  Administered 2022-06-15: 1200 mg via ORAL
  Filled 2022-06-15: qty 2

## 2022-06-15 MED ORDER — ORAL CARE MOUTH RINSE: 15.0000 mL | OROMUCOSAL | Status: DC | PRN

## 2022-06-15 NOTE — Discharge Summary (Addendum)
Physician Discharge Summary   Jeffrey Payne [id number] DOB: 1933-01-10 DOA: 06/14/2022  PCP: Kristen Loader, FNP  Admit date: 06/14/2022 Discharge date:  06/15/2022  Admitted From: Home Disposition:  Home Discharging physician: Dwyane Dee, MD Barriers to discharge:   Recommendations for Outpatient Follow-up:  Continue routine outpatient care Continue chronic HD   Discharge Condition: stable CODE STATUS: Full Diet recommendation:  Diet Orders (From admission, onward)     Start     Ordered   06/14/22 1353  Diet renal with fluid restriction Fluid restriction: 1200 mL Fluid; Room service appropriate? Yes; Fluid consistency: Thin  Diet effective now       Question Answer Comment  Fluid restriction: 1200 mL Fluid   Room service appropriate? 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  Due to feeling unwell he did miss dialysis on Thursday, but did go to Saturday session > This is also in the setting of known CHF with last echo in 2020 with EF 35-40%, G1 DD, normal RV function. > Went to PCP prior to admission and found to be hypoxic to 88% on room air which was redemonstrated here.  Concern for possible volume overload as he has had this issue in the past. > Chest x-ray consistent with pulmonary edema versus atypical infection.  Negative for flu COVID and RSV.  Rales on exam. -Patient underwent dialysis after admission on 06/14/2022; symptoms improved -Patient ambulated on room air with no desaturation  Cough - negative Flu, RSV, covid - check RVP, pending at discharge - recommended incentive spirometer use aggressively on discharge - Mucinex also initiated to aid with cough - Okay for him to continue taking recently  prescribed Bromefed DM at night to help sleep - no leukocytosis nor fever; not felt to warrant abx  Hypertension History of MAT > EKG with sinus rhythm.  In the room monitor appear to be intermittently in atrial fibrillation. - Continue home metoprolol; very short 4 beat NSVT noted on tele   GERD - Continue home Pepcid   BPH - Continue home tamsulosin   Anemia > Hemoglobin 12.5 - Stable from previous, trend CBC   History of possible Waldenstrm's macroglobulinemia History of postinflammatory pulmonary fibrosis (I see no detailed record on search) - Noted    The patient's chronic medical conditions were treated accordingly per the patient's home medication regimen except as noted.  On day of discharge, patient was felt deemed stable for discharge. Patient/family member advised to call PCP or come back to ER if needed.   Principal Diagnosis: Acute respiratory failure with hypoxia [facility])  Discharge Diagnoses: Active Hospital Problems   Diagnosis Date Noted   End stage renal disease on dialysis ([place]) 02/02/2022    Priority: 1.   Cough 06/15/2022    Priority: 3.   Anemia of chronic kidney failure 01/29/2021    Priority: 3.   Hypertension     Priority: 3.   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     Discharge Instructions     Increase activity slowly   Complete by: As directed       Allergies as of 06/15/2022   No Known Allergies      Medication List     STOP taking these medications    sevelamer carbonate 800 MG tablet Commonly known as: RENVELA       TAKE these medications     acetaminophen 650 MG CR tablet Commonly known as: TYLENOL Take 650 mg by mouth every 8 (eight) hours as needed for pain.   benzonatate 100 MG capsule Commonly known as: TESSALON Take 1 capsule (100 mg total) by mouth 3 (three) times daily.   calcium acetate 667 MG tablet Commonly known as: PHOSLO Take 667 mg by mouth 3 (three) times daily with meals.   carboxymethylcellulose 0.5 % Soln Commonly known as: REFRESH PLUS Place 1 drop into both eyes daily as needed (dry eyes).   cetirizine 10 MG tablet Commonly known as: ZYRTEC Take 10 mg by mouth daily.   famotidine 20 MG tablet Commonly known as: PEPCID Take 1 tablet (20 mg total) by mouth daily.   fluticasone 50 MCG/ACT nasal spray Commonly known as: FLONASE Place 2 sprays into both nostrils daily as needed for allergies or rhinitis.   guaiFENesin 600 MG 12 hr tablet Commonly known as: MUCINEX Take 2 tablets (1,200 mg total) by mouth 2 (two) times daily for 5 days.   lactose free nutrition Liqd Take 237 mLs by mouth daily as needed (for nutrition).   metoprolol tartrate 25 MG tablet Commonly known as: LOPRESSOR Take 0.5 tablets (12.5 mg total) by mouth 3 (three) times daily.   omeprazole 20 MG tablet Commonly known as: PRILOSEC OTC Take 20 mg by mouth daily.   tamsulosin 0.4 MG Caps capsule Commonly known as: FLOMAX Take 1 capsule (0.4 mg total) by mouth daily after supper.        No Known Allergies  Consultations: Nephrology   Procedures:   Discharge Exam: BP (!) 157/77 (BP Location: Left Arm)   Pulse 90   Temp 97.8 F (36.6 C) (Oral)   Resp 16   Ht 5' 7"$  (1.702 m)   Wt 69 kg   SpO2 96%   BMI 23.82 kg/m  Physical Exam Constitutional:      General: He is not in acute distress.    Appearance: Normal appearance.  HENT:     Head: Normocephalic and atraumatic.     Comments: Left face noted with bandage of prior Moh's surgery    Mouth/Throat:     Mouth: Mucous membranes are moist.  Eyes:      Extraocular Movements: Extraocular movements intact.  Cardiovascular:     Rate and Rhythm: Normal rate and regular rhythm.     Heart sounds: Normal heart sounds.  Pulmonary:     Effort: No respiratory distress.     Breath sounds: No wheezing.     Comments: Slightly decreased air movement from poor effort Abdominal:     General: Bowel sounds are normal. There is no distension.     Palpations: Abdomen is soft.     Tenderness: There is no abdominal tenderness.  Musculoskeletal:        General: Normal range of motion.     Cervical back: Normal range of motion and neck supple.  Skin:    General: Skin is warm and dry.  Neurological:     General: No focal deficit present.     Mental Status: He is alert.  Psychiatric:        Mood and Affect: Mood normal.        Behavior: Behavior normal.      The results of significant diagnostics from this hospitalization (including imaging, microbiology, ancillary and laboratory) are listed below for reference.   Microbiology: Recent Results (from the past 240 hour(s))  Resp panel by RT-PCR (RSV, Flu A&B, Covid) Anterior Nasal Swab     Status: None   Collection Time: 06/14/22 12:11 PM   Specimen: Anterior Nasal Swab  Result Value Ref Range Status   SARS Coronavirus 2 by RT PCR NEGATIVE NEGATIVE Final   Influenza A by PCR NEGATIVE NEGATIVE Final   Influenza B by PCR NEGATIVE NEGATIVE Final    Comment: (NOTE) The Xpert Xpress SARS-CoV-2/FLU/RSV plus assay is intended as an aid in the diagnosis of influenza from Nasopharyngeal swab specimens and should not be used as a sole basis for treatment. Nasal washings and aspirates are unacceptable for Xpert Xpress SARS-CoV-2/FLU/RSV testing.  Fact Sheet for Patients: EntrepreneurPulse.com.au  Fact Sheet for Healthcare Providers: IncredibleEmployment.be  This test is not yet approved or cleared by the [place] FDA and has been authorized for detection and/or  diagnosis of SARS-CoV-2 by FDA under an Emergency Use Authorization (EUA). This EUA will remain in effect (meaning this test can be used) for the duration of the COVID-19 declaration under Section 564(b)(1) of the Act, 21 U.S.C. section 360bbb-3(b)(1), unless the authorization is terminated or revoked.     Resp Syncytial Virus by PCR NEGATIVE NEGATIVE Final    Comment: (NOTE) Fact Sheet for Patients: EntrepreneurPulse.com.au  Fact Sheet for Healthcare Providers: IncredibleEmployment.be  This test is not yet approved or cleared by the [place] FDA and has been authorized for detection and/or diagnosis of SARS-CoV-2 by FDA under an Emergency Use Authorization (EUA). This EUA will remain in effect (meaning this test can be used) for the duration of the COVID-19 declaration under Section 564(b)(1) of the Act, 21 U.S.C. section 360bbb-3(b)(1), unless the authorization is terminated or revoked.  Performed at [facility], [place] [address] [place],  [address]      Labs: BNP (last 3 results) Recent Labs    02/01/22 2151  BNP [id number]   Basic Metabolic Panel: Recent Labs  Lab 06/14/22 1204 06/15/22 0448  NA 136 138  K 3.9 3.1*  CL 94* 95*  CO2 22 26  GLUCOSE 96 75  BUN 40* 15  CREATININE 6.55* 3.58*  CALCIUM 9.3 9.0  PHOS  --  2.9   Liver Function Tests: Recent Labs  Lab 06/15/22 0448  ALBUMIN 2.8*   No results for input(s): "LIPASE", "AMYLASE" in the last 168 hours. No results for input(s): "AMMONIA" in the last 168 hours. CBC: Recent Labs  Lab 06/14/22 1204 06/15/22 0448  WBC 7.5 8.5  HGB 12.5* 11.9*  HCT 37.9* 34.6*  MCV 101.3* 98.0  PLT 236 232   Cardiac Enzymes: No results for input(s): "CKTOTAL", "CKMB", "CKMBINDEX", "TROPONINI" in the last 168 hours. BNP: Invalid input(s): "POCBNP" CBG: No results for input(s): "GLUCAP" in the last 168 hours. D-Dimer No results for input(s): "DDIMER" in the  last 72 hours. Hgb A1c No results for input(s): "HGBA1C" in the last 72 hours. Lipid Profile No results for input(s): "CHOL", "HDL", "LDLCALC", "TRIG", "CHOLHDL", "LDLDIRECT" in the last 72 hours. Thyroid function studies No results for input(s): "  TSH", "T4TOTAL", "T3FREE", "THYROIDAB" in the last 72 hours.  Invalid input(s): "FREET3" Anemia work up No results for input(s): "VITAMINB12", "FOLATE", "FERRITIN", "TIBC", "IRON", "RETICCTPCT" in the last 72 hours. Urinalysis    Component Value Date/Time   COLORURINE YELLOW 04/06/2022 [place] 04/06/2022 1554   LABSPEC 1.010 04/06/2022 1554   PHURINE 7.5 04/06/2022 1554   GLUCOSEU NEGATIVE 04/06/2022 1554   HGBUR TRACE (A) 04/06/2022 1554   BILIRUBINUR NEGATIVE 04/06/2022 1554   KETONESUR NEGATIVE 04/06/2022 1554   PROTEINUR 100 (A) 04/06/2022 1554   NITRITE NEGATIVE 04/06/2022 1554   LEUKOCYTESUR NEGATIVE 04/06/2022 1554   Sepsis Labs Recent Labs  Lab 06/14/22 1204 06/15/22 0448  WBC 7.5 8.5   Microbiology Recent Results (from the past 240 hour(s))  Resp panel by RT-PCR (RSV, Flu A&B, Covid) Anterior Nasal Swab     Status: None   Collection Time: 06/14/22 12:11 PM   Specimen: Anterior Nasal Swab  Result Value Ref Range Status   SARS Coronavirus 2 by RT PCR NEGATIVE NEGATIVE Final   Influenza A by PCR NEGATIVE NEGATIVE Final   Influenza B by PCR NEGATIVE NEGATIVE Final    Comment: (NOTE) The Xpert Xpress SARS-CoV-2/FLU/RSV plus assay is intended as an aid in the diagnosis of influenza from Nasopharyngeal swab specimens and should not be used as a sole basis for treatment. Nasal washings and aspirates are unacceptable for Xpert Xpress SARS-CoV-2/FLU/RSV testing.  Fact Sheet for Patients: EntrepreneurPulse.com.au  Fact Sheet for Healthcare Providers: IncredibleEmployment.be  This test is not yet approved or cleared by the [place] FDA and has been authorized for  detection and/or diagnosis of SARS-CoV-2 by FDA under an Emergency Use Authorization (EUA). This EUA will remain in effect (meaning this test can be used) for the duration of the COVID-19 declaration under Section 564(b)(1) of the Act, 21 U.S.C. section 360bbb-3(b)(1), unless the authorization is terminated or revoked.     Resp Syncytial Virus by PCR NEGATIVE NEGATIVE Final    Comment: (NOTE) Fact Sheet for Patients: EntrepreneurPulse.com.au  Fact Sheet for Healthcare Providers: IncredibleEmployment.be  This test is not yet approved or cleared by the [place] FDA and has been authorized for detection and/or diagnosis of SARS-CoV-2 by FDA under an Emergency Use Authorization (EUA). This EUA will remain in effect (meaning this test can be used) for the duration of the COVID-19 declaration under Section 564(b)(1) of the Act, 21 U.S.C. section 360bbb-3(b)(1), unless the authorization is terminated or revoked.  Performed at [facility], [place] [address] [place], [place] [address]     Procedures/Studies: DG Chest Port 1 View  Result Date: 06/14/2022 CLINICAL DATA:  Cough, congestion EXAM: PORTABLE CHEST 1 VIEW COMPARISON:  02/01/2022 FINDINGS: Stable cardiomegaly. Aortic atherosclerosis. Increased perihilar and bibasilar interstitial markings. No appreciable pleural fluid collection. No pneumothorax. Stable left upper lobe granuloma. IMPRESSION: Increased perihilar and bibasilar interstitial markings, which may reflect pulmonary edema versus atypical/viral infection. Electronically Signed   By: Davina Poke D.O.   On: 06/14/2022 12:51     Time coordinating discharge: Over 30 minutes    Dwyane Dee, MD  [facility] 06/15/2022, 12:34 PM

## 2022-06-15 NOTE — Progress Notes (Signed)
Jeffrey Payne to be discharged Home per MD order. Discussed prescriptions and follow up appointments with the patient and wife. Prescriptions and medication list explained in detail. Patient and wife verbalized understanding.  Skin clean, dry and intact without evidence of skin break down, no evidence of skin tears noted. IV catheter discontinued intact. Site without signs and symptoms of complications. Dressing and pressure applied. Pt denies pain at the site currently. No complaints noted.  Patient free of lines, drains, and wounds.   An After Visit Summary (AVS) was printed and given to the patient. Patient escorted via wheelchair, and discharged home via private auto.  Amaryllis Dyke, RN

## 2022-06-15 NOTE — Progress Notes (Addendum)
[facility] Progress Note   Subjective:    Seen and examined patient at bedside. Seen sitting up eating breakfast. He reports feeling better after receiving HD overnight. I did not see a net UF documented but spoke with bedside RN who reported possibly around 1.5L (per her report from HD RN). He refuses another HD treatment today to place him back on schedule. Discussed with Hospitalist to determine possible discharge. Per Hospitalist, plan to ambulate patient to try to wean off O2.   Objective Vitals:   06/15/22 0103 06/15/22 0157 06/15/22 0417 06/15/22 0909  BP: (!) 152/55 111/66 (!) 122/49 (!) 157/77  Pulse: 82 88 80 90  Resp: (!) 25 18 16   $ Temp: 98.1 F (36.7 C) 97.6 F (36.4 C) 97.6 F (36.4 C) 97.8 F (36.6 C)  TempSrc: Oral   Oral  SpO2: 95% 98% 96% 96%  Weight:      Height:       Physical Exam General: Elderly male; frail, better-appearing; on 2L [place] Heart: S1 and S2; RRR; No MRGs Lungs: Fine rales LLL; coarse low-mid lobes; No wheezing or rhonchi Abdomen: Soft and non-tender Extremities: No bilateral LE edema Dialysis Access: R AVF (+) B/T   Filed Weights   06/14/22 1205  Weight: 69 kg    Intake/Output Summary (Last 24 hours) at 06/15/2022 0954 Last data filed at 06/15/2022 0600 Gross per 24 hour  Intake 0 ml  Output 0 ml  Net 0 ml    Additional Objective Labs: Basic Metabolic Panel: Recent Labs  Lab 06/14/22 1204 06/15/22 0448  NA 136 138  K 3.9 3.1*  CL 94* 95*  CO2 22 26  GLUCOSE 96 75  BUN 40* 15  CREATININE 6.55* 3.58*  CALCIUM 9.3 9.0  PHOS  --  2.9   Liver Function Tests: Recent Labs  Lab 06/15/22 0448  ALBUMIN 2.8*   No results for input(s): "LIPASE", "AMYLASE" in the last 168 hours. CBC: Recent Labs  Lab 06/14/22 1204 06/15/22 0448  WBC 7.5 8.5  HGB 12.5* 11.9*  HCT 37.9* 34.6*  MCV 101.3* 98.0  PLT 236 232   Blood Culture    Component Value Date/Time   SDES  04/21/2020 0328    BLOOD RIGHT  HAND Performed at [facility], [place] [address], [place], [place] [address]    SPECREQUEST  04/21/2020 0328    BOTTLES DRAWN AEROBIC ONLY Blood Culture adequate volume Performed at [place] [address], [place], [place] [address]    CULT  04/21/2020 0328    NO GROWTH 5 DAYS Performed at [facility], [place] [address] [place], [place] [address]    REPTSTATUS 04/26/2020 FINAL 04/21/2020 0328    Cardiac Enzymes: No results for input(s): "CKTOTAL", "CKMB", "CKMBINDEX", "TROPONINI" in the last 168 hours. CBG: No results for input(s): "GLUCAP" in the last 168 hours. Iron Studies: No results for input(s): "IRON", "TIBC", "TRANSFERRIN", "FERRITIN" in the last 72 hours. Lab Results  Component Value Date   INR 1.0 01/29/2021   INR 1.02 09/23/2016   Studies/Results: DG Chest Port 1 View  Result Date: 06/14/2022 CLINICAL DATA:  Cough, congestion EXAM: PORTABLE CHEST 1 VIEW COMPARISON:  02/01/2022 FINDINGS: Stable cardiomegaly. Aortic atherosclerosis. Increased perihilar and bibasilar interstitial markings. No appreciable pleural fluid collection. No pneumothorax. Stable left upper lobe granuloma. IMPRESSION: Increased perihilar and bibasilar interstitial markings, which may reflect pulmonary edema versus atypical/viral infection. Electronically Signed   By: Davina Poke D.O.  On: 06/14/2022 12:51    Medications:   calcium acetate  667 mg Oral TID WC   Chlorhexidine Gluconate Cloth  6 each Topical Q0600   famotidine  20 mg Oral Daily   heparin  5,000 Units Subcutaneous Q8H   metoprolol tartrate  12.5 mg Oral TID   sodium chloride flush  3 mL Intravenous Q12H   tamsulosin  0.4 mg Oral QPC supper    Dialysis Orders: TTS - [facility] 3hrs, BFR 400, DFR Auto 1.5,  EDW 56.3kg , 2K/ 2Ca Heparin 4000 unit bolus No ESA is indicated Calcitriol 0.55mg PO qHD - last dose 06/10/22  Assessment/Plan: Acute on chronic CHF/Acute  hypoxic resp failure - missed HD on 2/16. CXR consistent for pulmonary edema vs. Infection. Received HD overnight and reports feeling better. Per Hospitalist, plan to ambulate patient to try and wean off O2.  ESRD - on HD TTS; off schedule for now. S/p HD overnight (net UF 1.5L per bedside RN report). Patient refuses another treatment today to place him back on schedule. Plan for HD 2/21 if patient remains inpatient. Hypertension/volume  - Doesn't appear in severe overload, blood pressures controlled. Anemia of CKD - Hgb >12.5; no indication for ESA/Fe at this time Secondary Hyperparathyroidism -  Ca ok and Po4 low-hold binders for now. Nutrition - Renal diet with fluid restriction Dispo - Discussed with Hospitalist on possible discharge. Per Hospitalist, plan to ambulate patient to try and wean off O2. If successful, patient may go home later today. I will d/w his outpatient HD center to possibly set up a HD session tomorrow then Thursday if he is able to go home. If not, he will receive HD tomorrow inpatient.  Addendum 06/15/22 at 1:56pm - [facility]. Plan for patient to be scheduled for HD tomorrow (12:15pm chair time) [facility]. CPrince Rome NP  CTobie Poet NP [facility] 06/15/2022,9:54 AM  LOS: 0 days

## 2022-06-15 NOTE — Progress Notes (Signed)
New Admission Note:  Arrival Method: Stretcher Mental Orientation: Alert and oriented x 4 Telemetry: Box 10 NSR Assessment: Completed Skin: Warm and dry. Incision to left temple. Dressing CDI IV: NSL Pain: Denies Tubes: N/A Safety Measures: Safety Fall Prevention Plan initiated.  Admission: Completed 5 M  Orientation: Patient has been orientated to the room, unit and the staff. Welcome booklet given.  Family: DTR  Orders have been reviewed and implemented. Will continue to monitor the patient. Call light has been placed within reach and bed alarm has been activated.   Sima Matas BSN, RN  Phone Number: [phone]

## 2022-06-15 NOTE — Progress Notes (Signed)
Post Hemodialysis   06/15/22 0103  Vitals  Temp 98.1 F (36.7 C)  Pulse Rate 82  Resp (!) 25  BP (!) 152/55  SpO2 95 %  O2 Device Nasal Cannula  Type of Weight Post-Dialysis  Oxygen Therapy  O2 Flow Rate (L/min) 2 L/min  Post Treatment  Dialyzer Clearance Lightly streaked  Duration of HD Treatment -hour(s) 3 hour(s)  Liters Processed 56.2  Tolerated HD Treatment Yes  AVG/AVF Arterial Site Held (minutes) 8 minutes  AVG/AVF Venous Site Held (minutes) 4 minutes

## 2022-06-15 NOTE — Progress Notes (Signed)
SATURATION QUALIFICATIONS: (This note is used to comply with regulatory documentation for home oxygen)  Patient Saturations on Room Air at Rest = 93%  Patient Saturations on Room Air while Ambulating = 93%  Patient Saturations on n/a Liters of oxygen while Ambulating = n/a%  Please briefly explain why patient needs home oxygen:

## 2022-06-15 NOTE — TOC Transition Note (Addendum)
Transition of Care [facility]) - CM/SW Discharge Note   Patient Details  Name: Jeffrey Payne MRN: [id number] Date of Birth: 08-Feb-1933  [facility] CM/SW Contact:  Tom-Johnson, Renea Ee, RN Phone Number: 06/15/2022, 12:28 PM   Clinical Narrative:     Patient is scheduled for discharge today. CM spoke with patient and spouse, Venetia Night at bedside about discharge needs.   Patient is from [place] with wife and will be returning at discharge.  Patient denies any needs, No TOC needs or recommendations noted. Outpatient f/u and instructions on AVS. Wife to transport at discharge. No further TOC needs noted.        Final next level of care: Home/Self Care Barriers to Discharge: Barriers Resolved   Patient Goals and CMS Choice CMS Medicare.gov Compare Post Acute Care list provided to:: Patient Choice offered to / list presented to : NA  Discharge Placement                  Patient to be transferred to facility by: Wife      Discharge Plan and Services Additional resources added to the After Visit Summary for                  DME Arranged: N/A DME Agency: NA       HH Arranged: NA HH Agency: NA        Social Determinants of Health (SDOH) Interventions SDOH Screenings   Food Insecurity: No Food Insecurity (06/15/2022)  Housing: Low Risk  (06/15/2022)  Transportation Needs: No Transportation Needs (06/15/2022)  Utilities: Not At Risk (06/15/2022)  Tobacco Use: Low Risk  (06/14/2022)     Readmission Risk Interventions     No data to display

## 2022-06-15 NOTE — Care Management Obs Status (Signed)
[place] NOTIFICATION   Patient Details  Name: Cyric Wilfert MRN: [id number] Date of Birth: [birth year]   Medicare Observation Status Notification Given:  Yes    Tom-Johnson, Renea Ee, RN 06/15/2022, 12:04 PM

## 2022-06-15 NOTE — Progress Notes (Signed)
D/C order noted. Contacted [facility] to advise clinic of pt's d/c today and that pt should resume on Thursday unless other arrangements made by renal NP for pt to receive a treatment tomorrow (per nephrology note).   Melven Sartorius Renal Navigator [phone]

## 2022-06-15 NOTE — Plan of Care (Signed)
  Problem: Education: Goal: Knowledge of General Education information will improve Description: Including pain rating scale, medication(s)/side effects and non-pharmacologic comfort measures Outcome: Completed/Met   

## 2022-06-16 DIAGNOSIS — D689 Coagulation defect, unspecified: Secondary | ICD-10-CM | POA: Diagnosis not present

## 2022-06-16 DIAGNOSIS — N186 End stage renal disease: Secondary | ICD-10-CM | POA: Diagnosis not present

## 2022-06-16 DIAGNOSIS — Z992 Dependence on renal dialysis: Secondary | ICD-10-CM | POA: Diagnosis not present

## 2022-06-16 DIAGNOSIS — N2581 Secondary hyperparathyroidism of renal origin: Secondary | ICD-10-CM | POA: Diagnosis not present

## 2022-06-17 DIAGNOSIS — N2581 Secondary hyperparathyroidism of renal origin: Secondary | ICD-10-CM | POA: Diagnosis not present

## 2022-06-17 DIAGNOSIS — D689 Coagulation defect, unspecified: Secondary | ICD-10-CM | POA: Diagnosis not present

## 2022-06-17 DIAGNOSIS — N186 End stage renal disease: Secondary | ICD-10-CM | POA: Diagnosis not present

## 2022-06-17 DIAGNOSIS — Z992 Dependence on renal dialysis: Secondary | ICD-10-CM | POA: Diagnosis not present

## 2022-06-19 DIAGNOSIS — N2581 Secondary hyperparathyroidism of renal origin: Secondary | ICD-10-CM | POA: Diagnosis not present

## 2022-06-19 DIAGNOSIS — D689 Coagulation defect, unspecified: Secondary | ICD-10-CM | POA: Diagnosis not present

## 2022-06-19 DIAGNOSIS — N186 End stage renal disease: Secondary | ICD-10-CM | POA: Diagnosis not present

## 2022-06-19 DIAGNOSIS — Z992 Dependence on renal dialysis: Secondary | ICD-10-CM | POA: Diagnosis not present

## 2022-06-21 ENCOUNTER — Encounter (HOSPITAL_COMMUNITY): Payer: Self-pay | Admitting: Internal Medicine

## 2022-06-21 ENCOUNTER — Emergency Department (HOSPITAL_COMMUNITY): Payer: Medicare Other

## 2022-06-21 ENCOUNTER — Other Ambulatory Visit: Payer: Self-pay

## 2022-06-21 ENCOUNTER — Inpatient Hospital Stay (HOSPITAL_COMMUNITY)
Admission: EM | Admit: 2022-06-21 | Discharge: 2022-06-29 | DRG: 871 | Disposition: A | Discharge status: Skilled Nursing Facility | Payer: Medicare Other | Source: Skilled Nursing Facility | Attending: Internal Medicine | Admitting: Internal Medicine

## 2022-06-21 DIAGNOSIS — J209 Acute bronchitis, unspecified: Secondary | ICD-10-CM | POA: Diagnosis not present

## 2022-06-21 DIAGNOSIS — N2581 Secondary hyperparathyroidism of renal origin: Secondary | ICD-10-CM | POA: Diagnosis present

## 2022-06-21 DIAGNOSIS — R54 Age-related physical debility: Secondary | ICD-10-CM | POA: Diagnosis not present

## 2022-06-21 DIAGNOSIS — F419 Anxiety disorder, unspecified: Secondary | ICD-10-CM | POA: Diagnosis present

## 2022-06-21 DIAGNOSIS — N186 End stage renal disease: Secondary | ICD-10-CM

## 2022-06-21 DIAGNOSIS — R000 Tachycardia, unspecified: Secondary | ICD-10-CM | POA: Diagnosis not present

## 2022-06-21 DIAGNOSIS — A419 Sepsis, unspecified organism: Principal | ICD-10-CM | POA: Diagnosis present

## 2022-06-21 DIAGNOSIS — Z1152 Encounter for screening for COVID-19: Secondary | ICD-10-CM | POA: Diagnosis not present

## 2022-06-21 DIAGNOSIS — J9811 Atelectasis: Secondary | ICD-10-CM | POA: Diagnosis not present

## 2022-06-21 DIAGNOSIS — R6889 Other general symptoms and signs: Secondary | ICD-10-CM | POA: Diagnosis not present

## 2022-06-21 DIAGNOSIS — Z79899 Other long term (current) drug therapy: Secondary | ICD-10-CM

## 2022-06-21 DIAGNOSIS — Z515 Encounter for palliative care: Secondary | ICD-10-CM | POA: Diagnosis not present

## 2022-06-21 DIAGNOSIS — R2681 Unsteadiness on feet: Secondary | ICD-10-CM | POA: Diagnosis not present

## 2022-06-21 DIAGNOSIS — J189 Pneumonia, unspecified organism: Secondary | ICD-10-CM | POA: Diagnosis present

## 2022-06-21 DIAGNOSIS — Z7401 Bed confinement status: Secondary | ICD-10-CM | POA: Diagnosis not present

## 2022-06-21 DIAGNOSIS — D631 Anemia in chronic kidney disease: Secondary | ICD-10-CM | POA: Diagnosis not present

## 2022-06-21 DIAGNOSIS — R0602 Shortness of breath: Secondary | ICD-10-CM | POA: Diagnosis present

## 2022-06-21 DIAGNOSIS — B9689 Other specified bacterial agents as the cause of diseases classified elsewhere: Secondary | ICD-10-CM | POA: Diagnosis present

## 2022-06-21 DIAGNOSIS — H918X9 Other specified hearing loss, unspecified ear: Secondary | ICD-10-CM | POA: Diagnosis present

## 2022-06-21 DIAGNOSIS — R0600 Dyspnea, unspecified: Secondary | ICD-10-CM | POA: Diagnosis not present

## 2022-06-21 DIAGNOSIS — R531 Weakness: Secondary | ICD-10-CM | POA: Diagnosis not present

## 2022-06-21 DIAGNOSIS — B9781 Human metapneumovirus as the cause of diseases classified elsewhere: Secondary | ICD-10-CM | POA: Diagnosis present

## 2022-06-21 DIAGNOSIS — Z862 Personal history of diseases of the blood and blood-forming organs and certain disorders involving the immune mechanism: Secondary | ICD-10-CM

## 2022-06-21 DIAGNOSIS — M6281 Muscle weakness (generalized): Secondary | ICD-10-CM | POA: Diagnosis not present

## 2022-06-21 DIAGNOSIS — R7989 Other specified abnormal findings of blood chemistry: Secondary | ICD-10-CM | POA: Diagnosis not present

## 2022-06-21 DIAGNOSIS — Z66 Do not resuscitate: Secondary | ICD-10-CM | POA: Diagnosis present

## 2022-06-21 DIAGNOSIS — I132 Hypertensive heart and chronic kidney disease with heart failure and with stage 5 chronic kidney disease, or end stage renal disease: Secondary | ICD-10-CM | POA: Diagnosis present

## 2022-06-21 DIAGNOSIS — N189 Chronic kidney disease, unspecified: Secondary | ICD-10-CM | POA: Diagnosis not present

## 2022-06-21 DIAGNOSIS — I5040 Unspecified combined systolic (congestive) and diastolic (congestive) heart failure: Secondary | ICD-10-CM | POA: Diagnosis not present

## 2022-06-21 DIAGNOSIS — J208 Acute bronchitis due to other specified organisms: Secondary | ICD-10-CM | POA: Diagnosis not present

## 2022-06-21 DIAGNOSIS — Z8546 Personal history of malignant neoplasm of prostate: Secondary | ICD-10-CM

## 2022-06-21 DIAGNOSIS — E43 Unspecified severe protein-calorie malnutrition: Secondary | ICD-10-CM | POA: Diagnosis not present

## 2022-06-21 DIAGNOSIS — R6520 Severe sepsis without septic shock: Secondary | ICD-10-CM | POA: Diagnosis present

## 2022-06-21 DIAGNOSIS — G9341 Metabolic encephalopathy: Secondary | ICD-10-CM | POA: Diagnosis not present

## 2022-06-21 DIAGNOSIS — J9601 Acute respiratory failure with hypoxia: Secondary | ICD-10-CM

## 2022-06-21 DIAGNOSIS — Z743 Need for continuous supervision: Secondary | ICD-10-CM | POA: Diagnosis not present

## 2022-06-21 DIAGNOSIS — R636 Underweight: Secondary | ICD-10-CM | POA: Diagnosis present

## 2022-06-21 DIAGNOSIS — I10 Essential (primary) hypertension: Secondary | ICD-10-CM | POA: Diagnosis not present

## 2022-06-21 DIAGNOSIS — R0902 Hypoxemia: Secondary | ICD-10-CM | POA: Diagnosis not present

## 2022-06-21 DIAGNOSIS — I480 Paroxysmal atrial fibrillation: Secondary | ICD-10-CM | POA: Diagnosis not present

## 2022-06-21 DIAGNOSIS — Z992 Dependence on renal dialysis: Secondary | ICD-10-CM

## 2022-06-21 DIAGNOSIS — I272 Pulmonary hypertension, unspecified: Secondary | ICD-10-CM | POA: Diagnosis present

## 2022-06-21 DIAGNOSIS — J301 Allergic rhinitis due to pollen: Secondary | ICD-10-CM

## 2022-06-21 DIAGNOSIS — D72829 Elevated white blood cell count, unspecified: Secondary | ICD-10-CM | POA: Diagnosis not present

## 2022-06-21 DIAGNOSIS — E8809 Other disorders of plasma-protein metabolism, not elsewhere classified: Secondary | ICD-10-CM | POA: Diagnosis present

## 2022-06-21 DIAGNOSIS — J8410 Pulmonary fibrosis, unspecified: Secondary | ICD-10-CM | POA: Diagnosis not present

## 2022-06-21 DIAGNOSIS — J309 Allergic rhinitis, unspecified: Secondary | ICD-10-CM | POA: Diagnosis present

## 2022-06-21 DIAGNOSIS — Z681 Body mass index (BMI) 19 or less, adult: Secondary | ICD-10-CM | POA: Diagnosis not present

## 2022-06-21 DIAGNOSIS — K219 Gastro-esophageal reflux disease without esophagitis: Secondary | ICD-10-CM | POA: Diagnosis present

## 2022-06-21 DIAGNOSIS — I3139 Other pericardial effusion (noninflammatory): Secondary | ICD-10-CM | POA: Diagnosis not present

## 2022-06-21 DIAGNOSIS — I129 Hypertensive chronic kidney disease with stage 1 through stage 4 chronic kidney disease, or unspecified chronic kidney disease: Secondary | ICD-10-CM | POA: Diagnosis not present

## 2022-06-21 DIAGNOSIS — I447 Left bundle-branch block, unspecified: Secondary | ICD-10-CM | POA: Diagnosis present

## 2022-06-21 DIAGNOSIS — R34 Anuria and oliguria: Secondary | ICD-10-CM | POA: Diagnosis present

## 2022-06-21 DIAGNOSIS — I5042 Chronic combined systolic (congestive) and diastolic (congestive) heart failure: Secondary | ICD-10-CM | POA: Diagnosis present

## 2022-06-21 DIAGNOSIS — R400 Somnolence: Secondary | ICD-10-CM | POA: Diagnosis not present

## 2022-06-21 DIAGNOSIS — I499 Cardiac arrhythmia, unspecified: Secondary | ICD-10-CM | POA: Diagnosis not present

## 2022-06-21 DIAGNOSIS — R404 Transient alteration of awareness: Secondary | ICD-10-CM | POA: Diagnosis not present

## 2022-06-21 HISTORY — DX: Metabolic encephalopathy: G93.41

## 2022-06-21 HISTORY — DX: Shortness of breath: R06.02

## 2022-06-21 HISTORY — DX: Acute bronchitis, unspecified: J20.9

## 2022-06-21 HISTORY — DX: Sepsis, unspecified organism: A41.9

## 2022-06-21 HISTORY — DX: Acute respiratory failure with hypoxia: J96.01

## 2022-06-21 LAB — CBC WITH DIFFERENTIAL/PLATELET
Abs Immature Granulocytes: 0.24 10*3/uL — ABNORMAL HIGH (ref 0.00–0.07)
Basophils Absolute: 0.1 10*3/uL (ref 0.0–0.1)
Basophils Relative: 0 %
Eosinophils Absolute: 0.1 10*3/uL (ref 0.0–0.5)
Eosinophils Relative: 1 %
HCT: 44.5 % (ref 39.0–52.0)
Hemoglobin: 14.7 g/dL (ref 13.0–17.0)
Immature Granulocytes: 1 %
Lymphocytes Relative: 1 %
Lymphs Abs: 0.4 10*3/uL — ABNORMAL LOW (ref 0.7–4.0)
MCH: 33.9 pg (ref 26.0–34.0)
MCHC: 33 g/dL (ref 30.0–36.0)
MCV: 102.5 fL — ABNORMAL HIGH (ref 80.0–100.0)
Monocytes Absolute: 0.9 10*3/uL (ref 0.1–1.0)
Monocytes Relative: 4 %
Neutro Abs: 22.7 10*3/uL — ABNORMAL HIGH (ref 1.7–7.7)
Neutrophils Relative %: 93 %
Platelets: 244 10*3/uL (ref 150–400)
RBC: 4.34 MIL/uL (ref 4.22–5.81)
RDW: 15.9 % — ABNORMAL HIGH (ref 11.5–15.5)
WBC: 24.4 10*3/uL — ABNORMAL HIGH (ref 4.0–10.5)
nRBC: 0 % (ref 0.0–0.2)

## 2022-06-21 LAB — COMPREHENSIVE METABOLIC PANEL
ALT: 9 U/L (ref 0–44)
AST: 16 U/L (ref 15–41)
Albumin: 3 g/dL — ABNORMAL LOW (ref 3.5–5.0)
Alkaline Phosphatase: 112 U/L (ref 38–126)
Anion gap: 21 — ABNORMAL HIGH (ref 5–15)
BUN: 28 mg/dL — ABNORMAL HIGH (ref 8–23)
CO2: 24 mmol/L (ref 22–32)
Calcium: 9.1 mg/dL (ref 8.9–10.3)
Chloride: 96 mmol/L — ABNORMAL LOW (ref 98–111)
Creatinine, Ser: 5.6 mg/dL — ABNORMAL HIGH (ref 0.61–1.24)
GFR, Estimated: 9 mL/min — ABNORMAL LOW (ref >60)
Glucose, Bld: 74 mg/dL (ref 70–99)
Potassium: 3.6 mmol/L (ref 3.5–5.1)
Sodium: 141 mmol/L (ref 135–145)
Total Bilirubin: 1.9 mg/dL — ABNORMAL HIGH (ref 0.3–1.2)
Total Protein: 6.6 g/dL (ref 6.5–8.1)

## 2022-06-21 LAB — MAGNESIUM: Magnesium: 1.9 mg/dL (ref 1.7–2.4)

## 2022-06-21 LAB — VITAMIN B12: Vitamin B-12: 813 pg/mL (ref 180–914)

## 2022-06-21 LAB — TSH: TSH: 1.624 u[IU]/mL (ref 0.350–4.500)

## 2022-06-21 LAB — TROPONIN I (HIGH SENSITIVITY)
Troponin I (High Sensitivity): 93 ng/L — ABNORMAL HIGH (ref <18)
Troponin I (High Sensitivity): 81 ng/L — ABNORMAL HIGH (ref <18)

## 2022-06-21 LAB — RESP PANEL BY RT-PCR (RSV, FLU A&B, COVID)  RVPGX2
Influenza A by PCR: NEGATIVE
Influenza B by PCR: NEGATIVE
Resp Syncytial Virus by PCR: NEGATIVE
SARS Coronavirus 2 by RT PCR: NEGATIVE

## 2022-06-21 LAB — MRSA NEXT GEN BY PCR, NASAL: MRSA by PCR Next Gen: NOT DETECTED

## 2022-06-21 LAB — LACTIC ACID, PLASMA
Lactic Acid, Venous: 2.4 mmol/L (ref 0.5–1.9)
Lactic Acid, Venous: 1.1 mmol/L (ref 0.5–1.9)

## 2022-06-21 LAB — BRAIN NATRIURETIC PEPTIDE: B Natriuretic Peptide: >4500 pg/mL — ABNORMAL HIGH (ref 0.0–100.0)

## 2022-06-21 MED ORDER — VANCOMYCIN HCL 1500 MG/300ML IV SOLN
1500.0000 mg | Freq: Once | INTRAVENOUS | Status: AC
  Administered 2022-06-21: 1500 mg via INTRAVENOUS
  Filled 2022-06-21: qty 300

## 2022-06-21 MED ORDER — BENZONATATE 100 MG PO CAPS: 200.0000 mg | ORAL_CAPSULE | Freq: Three times a day (TID) | ORAL | Status: DC | PRN

## 2022-06-21 MED ORDER — IPRATROPIUM-ALBUTEROL 0.5-2.5 (3) MG/3ML IN SOLN
3.0000 mL | Freq: Four times a day (QID) | RESPIRATORY_TRACT | Status: DC
  Administered 2022-06-22 (×3): 3 mL via RESPIRATORY_TRACT
  Filled 2022-06-21 (×3): qty 3

## 2022-06-21 MED ORDER — IOHEXOL 350 MG/ML SOLN
75.0000 mL | Freq: Once | INTRAVENOUS | Status: AC | PRN
  Administered 2022-06-21: 75 mL via INTRAVENOUS

## 2022-06-21 MED ORDER — VANCOMYCIN VARIABLE DOSE PER UNSTABLE RENAL FUNCTION (PHARMACIST DOSING): Status: DC

## 2022-06-21 MED ORDER — SODIUM CHLORIDE 0.9 % IV SOLN
1.0000 g | Freq: Once | INTRAVENOUS | Status: AC
  Administered 2022-06-21: 1 g via INTRAVENOUS
  Filled 2022-06-21: qty 10

## 2022-06-21 MED ORDER — MELATONIN 3 MG PO TABS
3.0000 mg | ORAL_TABLET | Freq: Every evening | ORAL | Status: DC | PRN
  Administered 2022-06-23 – 2022-06-28 (×3): 3 mg via ORAL
  Filled 2022-06-21 (×3): qty 1

## 2022-06-21 MED ORDER — LORATADINE 10 MG PO TABS
10.0000 mg | ORAL_TABLET | Freq: Every day | ORAL | Status: DC
  Administered 2022-06-21 – 2022-06-29 (×8): 10 mg via ORAL
  Filled 2022-06-21 (×8): qty 1

## 2022-06-21 MED ORDER — ACETAMINOPHEN 650 MG RE SUPP: 650.0000 mg | Freq: Four times a day (QID) | RECTAL | Status: DC | PRN

## 2022-06-21 MED ORDER — VANCOMYCIN HCL IN DEXTROSE 1-5 GM/200ML-% IV SOLN: 1000.0000 mg | Freq: Once | INTRAVENOUS | Status: DC

## 2022-06-21 MED ORDER — METRONIDAZOLE 500 MG/100ML IV SOLN
500.0000 mg | Freq: Once | INTRAVENOUS | Status: AC
  Administered 2022-06-21: 500 mg via INTRAVENOUS
  Filled 2022-06-21: qty 100

## 2022-06-21 MED ORDER — ALBUTEROL SULFATE (2.5 MG/3ML) 0.083% IN NEBU
2.5000 mg | INHALATION_SOLUTION | RESPIRATORY_TRACT | Status: DC | PRN
  Administered 2022-06-27: 2.5 mg via RESPIRATORY_TRACT
  Filled 2022-06-21: qty 3

## 2022-06-21 MED ORDER — SODIUM CHLORIDE 0.9 % IV SOLN
1.0000 g | INTRAVENOUS | Status: DC
  Filled 2022-06-21: qty 10

## 2022-06-21 MED ORDER — SODIUM CHLORIDE 0.9 % IV SOLN: 1.0000 g | Freq: Once | INTRAVENOUS | Status: DC

## 2022-06-21 MED ORDER — ACETAMINOPHEN 325 MG PO TABS
650.0000 mg | ORAL_TABLET | Freq: Four times a day (QID) | ORAL | Status: DC | PRN
  Administered 2022-06-28 – 2022-06-29 (×2): 650 mg via ORAL
  Filled 2022-06-21 (×2): qty 2

## 2022-06-21 NOTE — ED Notes (Signed)
Patient placed back on NRB at 12L/min due to desaturation to 84% on 6L [place].

## 2022-06-21 NOTE — ED Provider Notes (Signed)
Accepted handoff at shift change from resident Dr Jeani Hawking. Please see prior provider note for full HPI.  Briefly: Patient is a 89 y.o. male who presents to the ER for shortness of breath and AMS. Reported hypotensive in the field, given 250 cc IVF. Hx CHF (EF 35-40%, echo 2022) and ESRD on hemodialysis (T/R/S, last treatment Saturday). Found to be hypoxic in the 80%'s, requiring 15 L NRB. Started on broad spectrum ABX.  WBC 24.4, CMP grossly at baseline. Initial troponin 93, delta troponin pending. Respiratory panel negative. CXR unchanged compared to prior. Lactic acid 2.4. BNP pending.   DDX/Plan: At time of shift change resident physician had called [facility] to admit for acute hypoxic respiratory failure in the setting of sepsis, was advised to order CT PE study and reconsult for admission once complete.   Physical Exam  BP 125/70   Pulse 69   Temp 98.1 F (36.7 C) (Axillary)   Resp (!) 25   Ht '5\' 7"'$  (1.702 m)   Wt 72.1 kg   SpO2 100%   BMI 24.90 kg/m    Results   Results for orders placed or performed during the hospital encounter of 06/21/22  Brain natriuretic peptide  Result Value Ref Range   B Natriuretic Peptide >4,500.0 (H) 0.0 - 100.0 pg/mL  Troponin I (High Sensitivity)  Result Value Ref Range   Troponin I (High Sensitivity) 81 (H) <18 ng/L   CT Angio Chest PE W and/or Wo Contrast  Result Date: 06/21/2022 CLINICAL DATA:  Dyspnea, hypoxia EXAM: CT ANGIOGRAPHY CHEST WITH CONTRAST TECHNIQUE: Multidetector CT imaging of the chest was performed using the standard protocol during bolus administration of intravenous contrast. Multiplanar CT image reconstructions and MIPs were obtained to evaluate the vascular anatomy. RADIATION DOSE REDUCTION: This exam was performed according to the departmental dose-optimization program which includes automated exposure control, adjustment of the mA and/or kV according to patient size and/or use of iterative reconstruction technique.  CONTRAST:  13m OMNIPAQUE IOHEXOL 350 MG/ML SOLN COMPARISON:  02/02/2022 FINDINGS: Cardiovascular: There is adequate opacification of the pulmonary arterial tree. No intraluminal filling defect identified to suggest acute pulmonary embolism. The central pulmonary arteries are enlarged in keeping with changes of pulmonary arterial hypertension. Global cardiac size is mildly enlarged. Small pericardial effusion. Mild coronary artery calcification. Extensive atherosclerotic calcification within the thoracic aorta. No aortic aneurysm. Mediastinum/Nodes: No enlarged mediastinal, hilar, or axillary lymph nodes. Thyroid gland, trachea, and esophagus demonstrate no significant findings. Lungs/Pleura: Mild right basilar dependent atelectasis. Lungs are otherwise clear save for a benign calcified granuloma within the left upper lobe. No pneumothorax or pleural effusion. Central airways are widely patent. Upper Abdomen: No acute abnormality. Musculoskeletal: No acute bone abnormality. No lytic or blastic bone lesion. Review of the MIP images confirms the above findings. IMPRESSION: 1. No pulmonary embolism. No acute intrathoracic pathology identified. 2. Mild cardiomegaly. Mild coronary artery calcification. 3. Morphologic changes in keeping with pulmonary arterial hypertension. Aortic Atherosclerosis (ICD10-I70.0). Electronically Signed   By: AFidela SalisburyM.D.   On: 06/21/2022 18:59   CT Head Wo Contrast  Result Date: 06/21/2022 CLINICAL DATA:  Altered level of consciousness, short of breath EXAM: CT HEAD WITHOUT CONTRAST TECHNIQUE: Contiguous axial images were obtained from the base of the skull through the vertex without intravenous contrast. RADIATION DOSE REDUCTION: This exam was performed according to the departmental dose-optimization program which includes automated exposure control, adjustment of the mA and/or kV according to patient size and/or use of iterative reconstruction technique. COMPARISON:  01/29/2021  FINDINGS: Brain: No evidence of acute infarct or hemorrhage. The lateral ventricles and midline structures are stable. Stable coarse calcifications within the central pons. Diffuse cerebral atrophy, with continued prominence of the extra-axial CSF spaces. No acute extra-axial fluid collection. No mass effect. Vascular: Stable atherosclerosis.  No hyperdense vessel. Skull: Normal. Negative for fracture or focal lesion. Sinuses/Orbits: Small bilateral mastoid effusions are unchanged. Remaining paranasal sinuses are clear. Other: None. IMPRESSION: 1. Stable head CT, no acute intracranial process. Electronically Signed   By: Randa Ngo M.D.   On: 06/21/2022 18:41   ED Course / MDM    Medical Decision Making Amount and/or Complexity of Data Reviewed Labs: ordered. Radiology: ordered. ECG/medicine tests: ordered.  Risk Prescription drug management. Decision regarding hospitalization.  CT PE study without evidence of PE. Second troponin 81. Second lactic acid 1.1.   1940 -- Case discussed with hospitalist Dr Velia Meyer who will admit. The patient appears reasonably stabilized for admission considering the current resources, flow, and capabilities available in the ED at this time, and I doubt any other [facility] requiring further screening and/or treatment in the ED prior to admission.    Estill Cotta 06/21/22 1942    Sherwood Gambler, MD 06/25/22 [phone]

## 2022-06-21 NOTE — ED Notes (Signed)
[phone] (wife) Rhian Yearta would like an update

## 2022-06-21 NOTE — ED Notes (Signed)
Wife has been updated

## 2022-06-21 NOTE — ED Notes (Signed)
Tried to call wife but went straight to voicemail

## 2022-06-21 NOTE — H&P (Signed)
History and Physical      Jeffrey Payne [id number] DOB: 10/14/1932 DOA: 06/21/2022  PCP: Jodene Nam, MD  Patient coming from: home   I have personally briefly reviewed patient's old medical records in [place]  Chief Complaint: Shortness of breath  HPI: My Sidel is a 89 y.o. 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   The following history is provided by the patient as well as his wife, in addition to my discussions with the EDP and via chart review.  The patient was recently hospitalized at [facility] from 06/14/2022 to 06/15/2022 for acute bronchitis in the setting of viral process, with respiratory viral panel on 06/14/2022 demonstrating positive finding for metapneumovirus.  His breathing subsequently improved with conservative measures and he was discharged home on 06/15/2022.   However, after this initial improvement in shortness of breath, he has been experiencing progressive shortness of breath over the last 2 to 3 days associated with worsening cough.  His shortness of breath is not associate with any orthopnea, PND, or worsening of peripheral edema.  Noted some subjective fever, but in the absence of rigors, chills, or generalized myalgias.  Not associated with any wheezing, hemoptysis.  Worsening cough has been mildly productive.  No associated any chest pain, palpitations, diaphoresis, nausea, vomiting, dizziness, presyncope, or syncope.  No known chronic underlying pulmonary pathology, and no known chronic supplemental oxygen requirements.  Lifelong non-smoker.  Denies any associated headache, neck stiffness, rash, abdominal pain, diarrhea,  melena, or hematochezia.  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  He is anuric at baseline.  Additionally, his history of chronic combined systolic/diastolic heart failure, with most recent echocardiogram in October 2022 notable for LVEF 35 to [id number], grade 1 diastolic dysfunction, and right ventricular systolic function normal; moderately dilated left atrium, trivial mitral vegetation and moderate aortic regurgitation.  Patient's wife is also noted patient to be slightly confused over the course of the last 1 to 2 days relative to baseline mental status.  No interval trauma.  Not associate any acute focal weakness, acute focal numbness, paresthesias, acute change in vision, vertigo, patient drip, or dysphagia.  In the setting of progressive shortness of breath, patient's wife contacted EMS earlier today, who noted patient's initial oxygen saturations to be in the 80s, [place] improving to 100% on 15 L nonrebreather.  EMS also noted patient's initial blood pressure to be in the 90s over 50s, prompting administration of 200 cc of IV fluid via EMS, socially brought the patient to [facility] emergency department for further evaluation management of the above.    ED Course:  Vital signs in the ED were notable for the following: Afebrile; heart rate [id number]; systolic blood pressure in the range of low 100s to 120s; respiratory rate 22-26, oxygen saturation 100% on 15 L nonrebreather.  Labs were notable for the following: CMP notable for the following: Sodium 141, potassium 3.6, bicarbonate 24, glucose 74, calcium, just for mild hypoalbuminemia noted to be 9.9, abdomen 3.0, total bili 1.9, otherwise liver enzymes within rest.  BNP greater than 4500, compared to most recent prior value of 4400 in October 2023.  High-sensitivity  troponin I initially noted to be 93, with repeat value trending  down to 81, which is relative to most recent prior high-sensitivity troponin values of 38 in October 2023 and 72 in October 2022.  Initial lactate 2.4, with repeat value trending down to 1.1.  CBC notable for will is 24,400 with 93% neutrophils.  Hemoglobin 14.7 compared to most recent prior value of 11.9 on 06/15/2022, platelet count 244.  Cultures x 2 were collected prior to initiation of IV antibiotics.  COVID, influenza, RSV PCR were all negative.  MRSA PCR ordered, others are currently pending.  Per my interpretation, EKG in ED demonstrated the following: Emergency most recent prior EKG from 06/15/2022, presenting EKG shows sinus rhythm with PACs, known left bundle branch block, heart rate 80, nonspecific to inversions in leads I and aVL, which T wave inversions in aVL appear unchanged most recent prior, no evidence of ST changes, including no evidence of ST elevation.  Imaging and additional notable ED work-up: Noncontrast CT of the head shows no evidence of acute intracranial process, including evidence of ventricular hemorrhage or any evidence of acute infarct.  Chest x-ray, performed radiology read, shows no evidence of acute cardiopulmonary process, including no evidence of infiltrate, edema, effusion, or pneumothorax.  CTA chest with pulmonary embolism study, per formal radiology read, shows no evidence of acute pulmonary embolism no any evidence of any additional acute cardiopulmonary process, including no evidence of infiltrate, edema, effusion, or pneumothorax.  EDP contacted on-call nephrology, Dr. Candiss Norse, Who has been added to the treatment team, with plan for hemodialysis in the morning (2/27).   While in the ED, the following were administered: Cefepime, IV Flagyl, IV vancomycin.  Subsequently, the patient was admitted for further evaluation management of acute hypoxic respiratory failure in the setting of acute viral bronchitis secondary to metapneumovirus, with presentation also notable  for mildly elevated troponin and acute metabolic encephalopathy.     Review of Systems: As per HPI otherwise 10 point review of systems negative.   Past Medical History:  Diagnosis Date   Anemia    Arthritis    knees   Cancer [facility])    prostate   ED (erectile dysfunction)    GERD (gastroesophageal reflux disease)    ocassional    Hard of hearing    Hypertension    Hypertriglyceridemia    Renal failure    Dialysis dependent    Past Surgical History:  Procedure Laterality Date   A/V FISTULAGRAM N/A 07/25/2020   Procedure: A/V FISTULAGRAM - Left Arm;  Surgeon: Elam Dutch, MD;  Location: [facility];  Service: Cardiovascular;  Laterality: N/A;   AV FISTULA PLACEMENT Left 03/04/2020   Procedure: BRACHIOCEPHALIC ARTERIOVENOUS (AV) FISTULA CREATION LEFT;  Surgeon: Elam Dutch, MD;  Location: [facility] OR;  Service: Vascular;  Laterality: Left;   AV FISTULA PLACEMENT Right 07/28/2020   Procedure: RIGHT  ARM ARTERIOVENOUS FISTULA CREATION;  Surgeon: Elam Dutch, MD;  Location: [facility];  Service: Vascular;  Laterality: Right;   COLONOSCOPY     HIP ARTHROPLASTY Left 01/30/2021   Procedure: ARTHROPLASTY BIPOLAR HIP (HEMIARTHROPLASTY);  Surgeon: Vanetta Mulders, MD;  Location: [facility];  Service: Orthopedics;  Laterality: Left;   IR AV DIALY SHUNT INTRO NEEDLE/INTRACATH INITIAL W/PTA/IMG RIGHT Right 02/09/2021   IR FLUORO GUIDE CV LINE RIGHT  02/11/2021   IR US GUIDE VASC ACCESS RIGHT  02/09/2021   IR US GUIDE VASC ACCESS RIGHT  02/11/2021   renal cyst biopsy  TONSILLECTOMY      Social History:  reports that he has never smoked. He has never used smokeless tobacco. He reports current alcohol use of about 14.0 standard drinks of alcohol per week. He reports that he does not use drugs.   No Known Allergies  Family History  Problem Relation Age of Onset   Cancer Mother     Family history reviewed and not pertinent    Prior to Admission medications   Medication Sig  Start Date End Date Taking? Authorizing Provider  acetaminophen (TYLENOL) 650 MG CR tablet Take 650 mg by mouth every 8 (eight) hours as needed for pain.    [provider]  benzonatate (TESSALON) 100 MG capsule Take 1 capsule (100 mg total) by mouth 3 (three) times daily. Patient not taking: Reported on 06/14/2022 02/04/22   Nita Sells, MD  calcium acetate (PHOSLO) 667 MG tablet Take 667 mg by mouth 3 (three) times daily with meals. Patient not taking: Reported on 06/14/2022 12/16/21   [provider]  carboxymethylcellulose (REFRESH PLUS) 0.5 % SOLN Place 1 drop into both eyes daily as needed (dry eyes). Patient not taking: Reported on 06/14/2022    [provider]  cetirizine (ZYRTEC) 10 MG tablet Take 10 mg by mouth daily.    [provider]  famotidine (PEPCID) 20 MG tablet Take 1 tablet (20 mg total) by mouth daily. Patient not taking: Reported on 06/14/2022 02/04/22   Nita Sells, MD  fluticasone [facility]) 50 MCG/ACT nasal spray Place 2 sprays into both nostrils daily as needed for allergies or rhinitis. Patient not taking: Reported on 06/14/2022    [provider]  lactose free nutrition (BOOST) LIQD Take 237 mLs by mouth daily as needed (for nutrition). Patient not taking: Reported on 06/14/2022    [provider]  metoprolol tartrate (LOPRESSOR) 25 MG tablet Take 0.5 tablets (12.5 mg total) by mouth 3 (three) times daily. 02/10/21   Nita Sells, MD  omeprazole (PRILOSEC OTC) 20 MG tablet Take 20 mg by mouth daily.    [provider]  tamsulosin (FLOMAX) 0.4 MG CAPS capsule Take 1 capsule (0.4 mg total) by mouth daily after supper. 10/09/19   Patrecia Pour, MD     Objective    Physical Exam: Vitals:   06/21/22 1745 06/21/22 1830 06/21/22 1845 06/21/22 1908  BP: (!) 108/45 (!) 126/109 (!) 119/49   Pulse: (!) 55 87 82   Resp: (!) 29 (!) 30 (!) 24   Temp:    98 F (36.7 C)  TempSrc:    Oral   SpO2: 100% 100% 100%   Weight:      Height:        General: appears to be stated age; alert, mildly confused Skin: warm, dry, no rash Head:  AT/[place] Mouth:  Oral mucosa membranes appear dry, normal dentition Neck: supple; trachea midline Heart:  RRR; did not appreciate any M/R/G Lungs: CTAB, did not appreciate any wheezes, rales, or rhonchi Abdomen: + BS; soft, ND, NT Vascular: 2+ pedal pulses b/l; 2+ radial pulses b/l Extremities: no peripheral edema, no muscle wasting Neuro: strength and sensation intact in upper and lower extremities b/l    Labs on Admission: I have personally reviewed following labs and imaging studies  CBC: Recent Labs  Lab 06/15/22 0448 06/21/22 1215  WBC 8.5 24.4*  NEUTROABS  --  22.7*  HGB 11.9* 14.7  HCT 34.6* 44.5  MCV 98.0 102.5*  PLT 232 [id number]   Basic Metabolic Panel:  Recent Labs  Lab 06/15/22 0448 06/21/22 1215  NA 138 141  K 3.1* 3.6  CL 95* 96*  CO2 26 24  GLUCOSE 75 74  BUN 15 28*  CREATININE 3.58* 5.60*  CALCIUM 9.0 9.1  PHOS 2.9  --    GFR: Estimated Creatinine Clearance: 8.7 mL/min (A) (by C-G formula based on SCr of 5.6 mg/dL (H)). Liver Function Tests: Recent Labs  Lab 06/15/22 0448 06/21/22 1215  AST  --  16  ALT  --  9  ALKPHOS  --  112  BILITOT  --  1.9*  PROT  --  6.6  ALBUMIN 2.8* 3.0*   No results for input(s): "LIPASE", "AMYLASE" in the last 168 hours. No results for input(s): "AMMONIA" in the last 168 hours. Coagulation Profile: No results for input(s): "INR", "PROTIME" in the last 168 hours. Cardiac Enzymes: No results for input(s): "CKTOTAL", "CKMB", "CKMBINDEX", "TROPONINI" in the last 168 hours. BNP (last 3 results) No results for input(s): "PROBNP" in the last 8760 hours. HbA1C: No results for input(s): "HGBA1C" in the last 72 hours. CBG: No results for input(s): "GLUCAP" in the last 168 hours. Lipid Profile: No results for input(s): "CHOL", "HDL", "LDLCALC", "TRIG", "CHOLHDL", "LDLDIRECT" in  the last 72 hours. Thyroid Function Tests: No results for input(s): "TSH", "T4TOTAL", "FREET4", "T3FREE", "THYROIDAB" in the last 72 hours. Anemia Panel: No results for input(s): "VITAMINB12", "FOLATE", "FERRITIN", "TIBC", "IRON", "RETICCTPCT" in the last 72 hours. Urine analysis:    Component Value Date/Time   COLORURINE YELLOW 04/06/2022 [place] 04/06/2022 1554   LABSPEC 1.010 04/06/2022 1554   PHURINE 7.5 04/06/2022 1554   GLUCOSEU NEGATIVE 04/06/2022 1554   HGBUR TRACE (A) 04/06/2022 1554   BILIRUBINUR NEGATIVE 04/06/2022 1554   KETONESUR NEGATIVE 04/06/2022 1554   PROTEINUR 100 (A) 04/06/2022 1554   NITRITE NEGATIVE 04/06/2022 1554   LEUKOCYTESUR NEGATIVE 04/06/2022 1554    Radiological Exams on Admission: CT Angio Chest PE W and/or Wo Contrast  Result Date: 06/21/2022 CLINICAL DATA:  Dyspnea, hypoxia EXAM: CT ANGIOGRAPHY CHEST WITH CONTRAST TECHNIQUE: Multidetector CT imaging of the chest was performed using the standard protocol during bolus administration of intravenous contrast. Multiplanar CT image reconstructions and MIPs were obtained to evaluate the vascular anatomy. RADIATION DOSE REDUCTION: This exam was performed according to the departmental dose-optimization program which includes automated exposure control, adjustment of the mA and/or kV according to patient size and/or use of iterative reconstruction technique. CONTRAST:  25m OMNIPAQUE IOHEXOL 350 MG/ML SOLN COMPARISON:  02/02/2022 FINDINGS: Cardiovascular: There is adequate opacification of the pulmonary arterial tree. No intraluminal filling defect identified to suggest acute pulmonary embolism. The central pulmonary arteries are enlarged in keeping with changes of pulmonary arterial hypertension. Global cardiac size is mildly enlarged. Small pericardial effusion. Mild coronary artery calcification. Extensive atherosclerotic calcification within the thoracic aorta. No aortic aneurysm.  Mediastinum/Nodes: No enlarged mediastinal, hilar, or axillary lymph nodes. Thyroid gland, trachea, and esophagus demonstrate no significant findings. Lungs/Pleura: Mild right basilar dependent atelectasis. Lungs are otherwise clear save for a benign calcified granuloma within the left upper lobe. No pneumothorax or pleural effusion. Central airways are widely patent. Upper Abdomen: No acute abnormality. Musculoskeletal: No acute bone abnormality. No lytic or blastic bone lesion. Review of the MIP images confirms the above findings. IMPRESSION: 1. No pulmonary embolism. No acute intrathoracic pathology identified. 2. Mild cardiomegaly. Mild coronary artery calcification. 3. Morphologic changes in keeping with pulmonary arterial hypertension. Aortic Atherosclerosis (ICD10-I70.0). Electronically Signed   By: ACassandria Anger  Christa See M.D.   On: 06/21/2022 18:59   CT Head Wo Contrast  Result Date: 06/21/2022 CLINICAL DATA:  Altered level of consciousness, short of breath EXAM: CT HEAD WITHOUT CONTRAST TECHNIQUE: Contiguous axial images were obtained from the base of the skull through the vertex without intravenous contrast. RADIATION DOSE REDUCTION: This exam was performed according to the departmental dose-optimization program which includes automated exposure control, adjustment of the mA and/or kV according to patient size and/or use of iterative reconstruction technique. COMPARISON:  01/29/2021 FINDINGS: Brain: No evidence of acute infarct or hemorrhage. The lateral ventricles and midline structures are stable. Stable coarse calcifications within the central pons. Diffuse cerebral atrophy, with continued prominence of the extra-axial CSF spaces. No acute extra-axial fluid collection. No mass effect. Vascular: Stable atherosclerosis.  No hyperdense vessel. Skull: Normal. Negative for fracture or focal lesion. Sinuses/Orbits: Small bilateral mastoid effusions are unchanged. Remaining paranasal sinuses are clear. Other:  None. IMPRESSION: 1. Stable head CT, no acute intracranial process. Electronically Signed   By: Randa Ngo M.D.   On: 06/21/2022 18:41   DG Chest Port 1 View  Result Date: 06/21/2022 CLINICAL DATA:  Sepsis. EXAM: PORTABLE CHEST 1 VIEW COMPARISON:  June 14, 2022. FINDINGS: Stable cardiomediastinal silhouette. Both lungs are clear. The visualized skeletal structures are unremarkable. IMPRESSION: No active disease. Aortic Atherosclerosis (ICD10-I70.0). Electronically Signed   By: Marijo Conception M.D.   On: 06/21/2022 12:39      Assessment/Plan   Principal Problem:   Acute hypoxic respiratory failure (HCC) Active Problems:   End-stage renal disease on hemodialysis (HCC)   Elevated troponin   Essential hypertension   History of anemia due to chronic kidney disease   Chronic combined systolic and diastolic heart failure (HCC)   GERD (gastroesophageal reflux disease)   Leukocytosis   Acute bronchitis   Shortness of breath   Acute metabolic encephalopathy   Severe sepsis (HCC)   Allergic rhinitis     #) Acute hypoxic respiratory failure due acute viral bronchitis with metapneumovirus: in the context of acute respiratory symptoms and no known baseline supplemental O2 requirements, presenting O2 sat in the 80s on room air, subsequently improving to 100% on 15 L nonrebreather. Appears to be on basis of worsening of recent diagnosis of acute viral bronchitis due to metapneumovirus after respiratory viral panel was positive for the latter on 06/14/2022.  He was recently admitted for this, but experienced initial improvement in his respiratory symptoms, which raises the possibility of increased risk for secondary bacterial pneumonia.  However, CTA chest shows no evidence of infiltrate or any other acute cardiopulmonary finding, including no evidence of edema, effusion, pneumothorax or acute pulmonary embolism.  No known chronic underlying pulmonary conditions and this lifelong non-smoker.   Consequently, bacterial pneumonia appears less likely at this time with presentation appears to be more likely to be on the basis of worsening of the patient's known recent acute viral bronchitis due to metapneumovirus.  For now, we will continue the IV antibiotics are initiated in the ED, but with low threshold for de-escalation/discontinuation thereof if procalcitonin is not elevated.  Suspect that mildly elevated troponin is on the basis of supply demand mismatch resulting from the decrease in oxygen delivery capacity that is a/w presenting acute hypoxic respiratory distress as opposed to representing a type I process due to acute plaque rupture, particularly given presenting ekg that shows no e/o acute ischemic changes, including no evidence of STEMI, and the absence of any reported chest pain a/w this presentation.  He is also due for hemodialysis tomorrow, raising possibility of a contribution towards elevated troponin from diminished renal clearance.  Overall, ACS is felt to be less likely relative to type 2 supply demand mismatch, as above, but will closely monitor on telemetry overnight while pursuing an updated echocardiogram in the morning.   In terms of other considered etiologies, no clinical or radiographic evidence to suggest acutely decompensated heart failure at this time noting BNP very similar to most recent prior, while CTA chest shows [place] no evidence of edema, infiltrate, pleural effusion.  Of note, updated COVID-19, influenza, RSV PCR all negative.   Plan: Scheduled duo nebulizer treatments.  As needed albuterol nebulizers.  Refraining from systemic corticosteroids at this time given the absence of corresponding wheezing to suggest an element bronchospasm.  Monitor continuous pulse ox with prn supplemental O2 to maintain O2 sats greater than or equal to 92%. monitor on telemetry. CMP/CBC in the AM. Check serum Mg and Phos levels. Check blood gas. Flutter valve, incentive spirometry.   Prn Tessalon Perles.  Procalcitonin level, as above.  Echocardiogram the morning.  Routine scheduled hemodialysis in the morning, as above.           #) Severe sepsis due to acute viral bronchitis: In setting of the above presenting worsening shortness of breath, productive cough, subjective fever following recent respiratory viral panel positive for metapneumovirus, as further detailed above, while CTA chest shows no evidence of acute cardiopulmonary process, as above.  SIRS criteria met via leukocytosis, tachycardia, tachypnea. Lactic acid level: Initially 2.4, with repeat value trending down to 1.1.. Of note, given the associated presence of suspected end organ damage in the form of concominant presenting lactic acidosis, acute hypoxic respiratory failure, and acute metabolic encephalopathy, criteria are met for pt's sepsis to be considered severe in nature. However, in the absence of lactic acid level that is greater than or equal to 4.0, and in the absence of any associated hypotension refractory to IVF's, there are no indications for administration of a 30 mL/kg IVF bolus at this time.  Overall, will refrain from aggressive additional IV fluids given the patient's picture of hemodynamic stability, with rationale for this caution regarding aggressive IV fluids on the basis of incisional disease requiring hemodialysis as well as his history of chronic combined systolic/diastolic heart failure.  Additional ED work-up/management notable for: Blood cultures x 2 collected for prior to initiation of broad-spectrum IV antibiotics in the form of IV bank, cefepime, and Flagyl.  As noted above, no evidence of bacterial pneumonia, or any evidence of additional underlying infectious process aside from his acute viral bronchitis.  Anuric at baseline.  Will add on procalcitonin level, with plan for antibiotic de-escalation/continuation if this value was found to be nonelevated.   Plan: CBC w/ diff and CMP  in AM.  Follow for results of blood cx's x 2.  Add on procalcitonin level, as above.  Scheduled duo nebulizers,.  Albuterol, flutter valve, incentive neurology, prn Tessalon Perles.  Monitor on telemetry.            #) Acute metabolic encephalopathy: 1-2 days of confusion relative to baseline mental status, which appears to be on the basis of physiologic stressors stemming from presenting from severe sepsis due to acute viral bronchitis with resultant acute hypoxic respiratory failure, as above.  No obvious additional contributory underlying infectious process at this time, as further outlined above.    No additional overt metabolic/electrolyte contributions at this time.  Will does have incisional  disease on hemodialysis, he does not appear overtly uremic at this time, and has been attending all of his scheduled hemodialysis sessions.  No obvious contributory pharmacologic factors. No overt acute focal neurologic deficits to suggest a contribution from an underlying acute CVA, while CT head showed no evidence of acute process. Seizures are also felt to be less likely. Will check VBG to evaluate for any contribution from hypercapnic encephalopathy.    Plan: fall precautions.  Delirium precautions . repeat CMP/CBC in the AM. Check magnesium level. check TSH, vbg .  Hold home PPI for now.  Further evaluation management presenting severe sepsis due to acute viral bronchitis, as above.  Check procalcitonin level, INR, ammonia level.                 #) Chronic combined systolic/diastolic heart failure: documented history of such, with most recent echocardiogram performed in October 2022, which showed LVEF 35 to 40% as well as grade 1 diastolic dysfunction and moderate aortic regurgitation, with additional results as conveyed above. No clinical, radiographic, or laboratory evidence to suggest acutely decompensated heart failure at this time. home diuretic regimen reportedly consists of  the following: None, as this patient is anuricat baseline.  Will follow for results of echocardiogram ordered for the morning to further evaluate mildly elevated percent troponin, noting that it has been a year and a half since most recent prior echocardiogram.  Plan: monitor strict I's & O's and daily weights. Repeat CT PE in AM. Check serum mag level.  Echocardiogram ordered for the morning.  Routine hemodialysis in the morning, as further detailed above.             #) ESRD: on HD (schedule: Tuesday, Thursday, Saturday). Next due for routine HD on Tuesday, 06/22/2022. No clinical evidence for urgent overnight HD or to expedite HD relative to this timeframe, including no evidence of hyperkalemia, anion gap metabolic acidosis, nor any evidence of acute volume overload, including CTA chest which shows no evidence of edema, infiltrate, or pleural effusion. EDP contacted on-call nephrology, Dr. Candiss Norse, Who has been added to the treatment team, with plan for hemodialysis in the morning (2/27).   Plan: monitor strict I's/O's, daily weights. CMP in the AM. Check mag and phos levels.  Routine hemodialysis in the morning, as further detailed above.             #) Anemia of chronic kidney disease: Documented history of such, a/w with baseline hgb range 10-13, with presenting hgb slightly higher relative to this range, in the absence of any overt evidence of active bleed.  Potential element of relative hemoconcentration in the context of severe sepsis due to presenting acute viral bronchitis.  However, as the patient appears hemodynamically stable, will refrain from aggressive IV fluids, with rationale as outlined above.   Plan: Repeat CBC in the morning.  Check INR.               #) GERD: documented h/o such; on omeprazole as outpatient.  However, in the setting of presenting acute metabolic encephalopathy, will hold home PPI for now to limit any potential contributory features  posed by this outpatient medication.  Plan: continue home PPI.               #) Essential Hypertension: documented h/o such, with outpatient antihypertensive regimen including metoprolol tartrate, Flomax.  Given the patient's initial soft blood pressures in the context of presenting severe sepsis, will hold home hypertensive medications for now.  Plan:  Close monitoring of subsequent BP via routine VS. hold home antihypertensive medications for now, as above.            #) Allergic Rhinitis: documented h/o such, on scheduled Zyrtec as outpatient, in the absence of any scheduled/as needed intranasal corticosteroid.   Plan: cont home Zyrtec.         DVT prophylaxis: SCD's   Code Status: Full code Family Communication: Case was discussed with the patient's wife Disposition Plan: Per Rounding Team Consults called: EDP contacted on-call nephrology, Dr. Candiss Norse, Who has been added to the treatment team, with plan for hemodialysis in the morning (2/27).   Admission status: Inpatient     I SPENT GREATER THAN 75  MINUTES IN CLINICAL CARE TIME/MEDICAL DECISION-MAKING IN COMPLETING THIS ADMISSION.      [place] DO [facility]  From [place]   06/21/2022, 8:36 PM

## 2022-06-21 NOTE — ED Provider Notes (Addendum)
[facility] Provider Note  CSN: [id number] Arrival date & time: 06/21/22  1152  History  Chief Complaint  Patient presents with   Code Sepsis   Jeffrey Payne is a 89 y.o. male.  Patient is an 89 year old man presenting from assisted living via EMS for shortness of breath.  Patient himself is unable to tell us any details.  EMS reports history of CHF and ESRD on HD, last HD Saturday.  Patient was slightly hypotensive in route down to 90/50 and given 200 mL NS bolus, responded nicely.  Hypoxic for EMS down to mid 80s, brought in on 2 L [place] with SpO2 97%.  CBG 94, capnography 28, respiratory rate 25.  On exam, patient is oriented to self and president only.  Can only tell me that he has been short of breath, unknown duration.  He believes his wife thought she go to the doctor so she is going to call 911.  PMH includes CHF, last echo 02/01/2021 demonstrates EF 35-40% with global LV hypokinesis, G1 DD, mod dilated LA, moderate AVR.  Home Medications Prior to Admission medications   Medication Sig Start Date End Date Taking? Authorizing Provider  acetaminophen (TYLENOL) 650 MG CR tablet Take 650 mg by mouth every 8 (eight) hours as needed for pain.    [provider]  benzonatate (TESSALON) 100 MG capsule Take 1 capsule (100 mg total) by mouth 3 (three) times daily. Patient not taking: Reported on 06/14/2022 02/04/22   Nita Sells, MD  calcium acetate (PHOSLO) 667 MG tablet Take 667 mg by mouth 3 (three) times daily with meals. Patient not taking: Reported on 06/14/2022 12/16/21   [provider]  carboxymethylcellulose (REFRESH PLUS) 0.5 % SOLN Place 1 drop into both eyes daily as needed (dry eyes). Patient not taking: Reported on 06/14/2022    [provider]  cetirizine (ZYRTEC) 10 MG tablet Take 10 mg by mouth daily.    [provider]  famotidine (PEPCID) 20 MG tablet Take 1 tablet (20 mg total) by mouth  daily. Patient not taking: Reported on 06/14/2022 02/04/22   Nita Sells, MD  fluticasone [facility]) 50 MCG/ACT nasal spray Place 2 sprays into both nostrils daily as needed for allergies or rhinitis. Patient not taking: Reported on 06/14/2022    [provider]  lactose free nutrition (BOOST) LIQD Take 237 mLs by mouth daily as needed (for nutrition). Patient not taking: Reported on 06/14/2022    [provider]  metoprolol tartrate (LOPRESSOR) 25 MG tablet Take 0.5 tablets (12.5 mg total) by mouth 3 (three) times daily. 02/10/21   Nita Sells, MD  omeprazole (PRILOSEC OTC) 20 MG tablet Take 20 mg by mouth daily.    [provider]  tamsulosin (FLOMAX) 0.4 MG CAPS capsule Take 1 capsule (0.4 mg total) by mouth daily after supper. 10/09/19   Patrecia Pour, MD     Allergies    Patient has no known allergies.    Review of Systems   Review of Systems  Respiratory:  Positive for shortness of breath.   Psychiatric/Behavioral:  Positive for confusion and decreased concentration.        Slow to wake up, confused, "not himself" per wife    Physical Exam Updated Vital Signs BP (!) 123/58   Pulse 91   Temp 98.1 F (36.7 C) (Axillary)   Resp (!) 23   Ht '5\' 7"'$  (1.702 m)   Wt 72.1 kg   SpO2 100%  BMI 24.90 kg/m  Physical Exam Constitutional:      General: He is in acute distress.     Appearance: He is ill-appearing.     Comments: Thin, hard of hearing, tangential  HENT:     Head: Normocephalic.  Cardiovascular:     Rate and Rhythm: Tachycardia present. Rhythm irregular.     Pulses: Normal pulses.     Heart sounds: Normal heart sounds. No murmur heard. Pulmonary:     Effort: Respiratory distress present.     Breath sounds: Rales present. No wheezing or rhonchi.     Comments: 1-3 word dyspnea Chest:     Chest wall: No tenderness.  Abdominal:     General: There is distension.     Palpations: There is no mass.     Tenderness: There is no  abdominal tenderness. There is no guarding or rebound.     Hernia: No hernia is present.     Comments: Asymmetric protuberant soft nontender belly  Skin:    General: Skin is warm.     Capillary Refill: Capillary refill takes less than 2 seconds.  Neurological:     General: No focal deficit present.     Mental Status: He is disoriented.    ED Results / Procedures / Treatments   Labs (all labs ordered are listed, but only abnormal results are displayed) Labs Reviewed  LACTIC ACID, PLASMA - Abnormal; Notable for the following components:      Result Value   Lactic Acid, Venous 2.4 (*)    All other components within normal limits  COMPREHENSIVE METABOLIC PANEL - Abnormal; Notable for the following components:   Chloride 96 (*)    BUN 28 (*)    Creatinine, Ser 5.60 (*)    Albumin 3.0 (*)    Total Bilirubin 1.9 (*)    GFR, Estimated 9 (*)    Anion gap 21 (*)    All other components within normal limits  CBC WITH DIFFERENTIAL/PLATELET - Abnormal; Notable for the following components:   WBC 24.4 (*)    MCV 102.5 (*)    RDW 15.9 (*)    Neutro Abs 22.7 (*)    Lymphs Abs 0.4 (*)    Abs Immature Granulocytes 0.24 (*)    All other components within normal limits  TROPONIN I (HIGH SENSITIVITY) - Abnormal; Notable for the following components:   Troponin I (High Sensitivity) 93 (*)    All other components within normal limits  RESP PANEL BY RT-PCR (RSV, FLU A&B, COVID)  RVPGX2  CULTURE, BLOOD (ROUTINE X 2)  CULTURE, BLOOD (ROUTINE X 2)  LACTIC ACID, PLASMA  BRAIN NATRIURETIC PEPTIDE  URINALYSIS, W/ REFLEX TO CULTURE (INFECTION SUSPECTED)  TROPONIN I (HIGH SENSITIVITY)   EKG EKG Interpretation  Date/Time:  Monday June 21 2022 12:04:52 EST Ventricular Rate:  80 PR Interval:  202 QRS Duration: 131 QT Interval:  401 QTC Calculation: 463 R Axis:   -31 Text Interpretation: Sinus rhythm Atrial premature complexes in couplets Left bundle branch block Confirmed by Godfrey Pick  [phone]) on 06/21/2022 12:53:57 PM  Radiology DG Chest Port 1 View  Result Date: 06/21/2022 CLINICAL DATA:  Sepsis. EXAM: PORTABLE CHEST 1 VIEW COMPARISON:  June 14, 2022. FINDINGS: Stable cardiomediastinal silhouette. Both lungs are clear. The visualized skeletal structures are unremarkable. IMPRESSION: No active disease. Aortic Atherosclerosis (ICD10-I70.0). Electronically Signed   By: Marijo Conception M.D.   On: 06/21/2022 12:39    Procedures Procedures   Medications Ordered in ED Medications  metroNIDAZOLE (FLAGYL) IVPB 500 mg (500 mg Intravenous New Bag/Given 06/21/22 1451)  vancomycin (VANCOREADY) IVPB 1500 mg/300 mL (1,500 mg Intravenous New Bag/Given 06/21/22 1525)  vancomycin variable dose per unstable renal function (pharmacist dosing) (has no administration in time range)  ceFEPIme (MAXIPIME) 1 g in sodium chloride 0.9 % 100 mL IVPB (0 g Intravenous Stopped 06/21/22 1524)   ED Course/ Medical Decision Making/ A&P  Medical Decision Making 89 year old man here with dyspnea, hypertension, lethargy, and fever per EMS.  Vital signs in ED improved s/p 200 mL bolus en route.  Will start sepsis workup with labs, CXR, blood cultures, and urine cultures.  Rales heard in posterior lungs, concern for possible heart failure exacerbation.  12:40 pm: CXR appears unchanged from previous, awaiting formal radiology read.  EKG with first-degree block, PR 202, LBBB unchanged from previous.  Awaiting labs to return.  2:20 pm: Lactic acid returned at 2.4, will initiate broad-spectrum antibiotics with vancomycin, cefepime, and metronidazole.  Awaiting some labs, CMP consistent with ESRD on HD, no red flags, troponin 93, slightly elevated from baseline, respiratory panel negative for flu, COVID, and RSV.  Awaiting remainder of labs to further differentiate illness prior to calling for admission.  3pm: Patient comfortable with normal SpO2 on 15 L NRB. CBC and BNP still not resulted.  Called lab to follow up  - they relay specimen has been relocated, they will run the CBC first and then follow up with BNP. Patient also due for troponin recheck, I have notified nurse to draw as able.   3:10pm: CBC with WBC to 24.4. Calling for admission.   3:30pm: Spoke with admitting hospitalist, who requests PE study given new oxygen requirement. Will order. Hospitalist to see. Will handoff care to oncoming ED physicians follow-up PE study and communicate with admitting.   Amount and/or Complexity of Data Reviewed Independent Historian: spouse    Details: Wife at bedside Labs: ordered. Decision-making details documented in ED Course.    Details: CBC, CMP, troponin, lactic acid, BNP Radiology: ordered. Decision-making details documented in ED Course.    Details: CXR ECG/medicine tests: ordered. Decision-making details documented in ED Course.    Details: Twelve-lead EKG  Risk Prescription drug management.   Final Clinical Impression(s) / ED Diagnoses Final diagnoses:  Sepsis, due to unspecified organism, unspecified whether acute organ dysfunction present [facility])   Rx / DC Orders ED Discharge Orders     None      Ezequiel Essex, MD   Ezequiel Essex, MD 06/21/22 1508    Ezequiel Essex, MD 06/21/22 1531    Godfrey Pick, MD 06/21/22 1624

## 2022-06-21 NOTE — ED Triage Notes (Signed)
Pt bib ems from [facility] assisted living; discharged from hospital on 20th, diagnosed with acute resp failure hypoxia, sent home with mucinex; today c/o generalized weakness, tylenol given PTA; 99.5 F, warm to touch; 89% ra, 2L [place] 97%, bp 90/50, 200cc fluid given PTA, hx chf and ESRD, dialysis T TH Sa, cbg 94, capnography 28; pt lethargic, RR 25, last bp 127/60; dialysis  RA restricted, last dialyzed Saturday

## 2022-06-21 NOTE — Progress Notes (Signed)
Pharmacy Antibiotic Note  Jeffrey Payne is a 89 y.o. male admitted on 06/21/2022 with sepsis, unknown source.  Pharmacy has been consulted for vancomycin and cefepime dosing. Patient is ESRD HD TTS outpatient, last HD Saturday. WBC 24.4, afebrile.   Plan: Vancomycin 1500 mg IV x 1. F/u HD plans to schedule post-HD vanc doses  Cefepime 1g IV q24h  Monitor C&S, HD plans, s/sx improvement, pre-HD vancomycin levels as indicated   Height: '5\' 7"'$  (170.2 cm) Weight: 72.1 kg (159 lb) IBW/kg (Calculated) : 66.1  Temp (24hrs), Avg:98.6 F (37 C), Min:98.6 F (37 C), Max:98.6 F (37 C)  Recent Labs  Lab 06/15/22 0448 06/21/22 1215  WBC 8.5  --   CREATININE 3.58* 5.60*  LATICACIDVEN  --  2.4*    Estimated Creatinine Clearance: 8.7 mL/min (A) (by C-G formula based on SCr of 5.6 mg/dL (H)).    No Known Allergies  Antimicrobials this admission: vancomycin 2/26 >>  cefepime 2/26 >>  metronidazole 2/26 x 1  Microbiology results: 2/26 BCx: sent 2/26 RVP: neg 2/26 MRSA pcr: ordered   Thank you for allowing pharmacy to be a part of this patient's care.  Eliseo Gum, PharmD PGY1 Pharmacy Resident   06/21/2022  4:51 PM

## 2022-06-22 ENCOUNTER — Inpatient Hospital Stay (HOSPITAL_COMMUNITY): Payer: Medicare Other

## 2022-06-22 DIAGNOSIS — J9601 Acute respiratory failure with hypoxia: Secondary | ICD-10-CM | POA: Diagnosis not present

## 2022-06-22 DIAGNOSIS — N186 End stage renal disease: Secondary | ICD-10-CM | POA: Diagnosis not present

## 2022-06-22 DIAGNOSIS — Z992 Dependence on renal dialysis: Secondary | ICD-10-CM | POA: Diagnosis not present

## 2022-06-22 DIAGNOSIS — A419 Sepsis, unspecified organism: Secondary | ICD-10-CM | POA: Diagnosis not present

## 2022-06-22 LAB — BLOOD CULTURE ID PANEL (REFLEXED) - BCID2

## 2022-06-22 LAB — PROTIME-INR
INR: 1.2 (ref 0.8–1.2)
Prothrombin Time: 15.2 seconds (ref 11.4–15.2)

## 2022-06-22 LAB — I-STAT VENOUS BLOOD GAS, ED
Acid-Base Excess: 3 mmol/L — ABNORMAL HIGH (ref 0.0–2.0)
Bicarbonate: 25.5 mmol/L (ref 20.0–28.0)
Calcium, Ion: 0.97 mmol/L — ABNORMAL LOW (ref 1.15–1.40)
HCT: 41 % (ref 39.0–52.0)
Hemoglobin: 13.9 g/dL (ref 13.0–17.0)
O2 Saturation: 94 %
Potassium: 5.6 mmol/L — ABNORMAL HIGH (ref 3.5–5.1)
Sodium: 133 mmol/L — ABNORMAL LOW (ref 135–145)
TCO2: 26 mmol/L (ref 22–32)
pCO2, Ven: 31.9 mmHg — ABNORMAL LOW (ref 44–60)
pH, Ven: 7.512 — ABNORMAL HIGH (ref 7.25–7.43)
pO2, Ven: 62 mmHg — ABNORMAL HIGH (ref 32–45)

## 2022-06-22 LAB — CBC WITH DIFFERENTIAL/PLATELET
Abs Immature Granulocytes: 0 10*3/uL (ref 0.00–0.07)
Basophils Absolute: 0 10*3/uL (ref 0.0–0.1)
Basophils Relative: 0 %
Eosinophils Absolute: 0.3 10*3/uL (ref 0.0–0.5)
Eosinophils Relative: 1 %
HCT: 40 % (ref 39.0–52.0)
Hemoglobin: 13 g/dL (ref 13.0–17.0)
Lymphocytes Relative: 1 %
Lymphs Abs: 0.3 10*3/uL — ABNORMAL LOW (ref 0.7–4.0)
MCH: 33.3 pg (ref 26.0–34.0)
MCHC: 32.5 g/dL (ref 30.0–36.0)
MCV: 102.6 fL — ABNORMAL HIGH (ref 80.0–100.0)
Monocytes Absolute: 1.3 10*3/uL — ABNORMAL HIGH (ref 0.1–1.0)
Monocytes Relative: 4 %
Neutro Abs: 31.2 10*3/uL — ABNORMAL HIGH (ref 1.7–7.7)
Neutrophils Relative %: 94 %
Platelets: 227 10*3/uL (ref 150–400)
RBC: 3.9 MIL/uL — ABNORMAL LOW (ref 4.22–5.81)
RDW: 16.1 % — ABNORMAL HIGH (ref 11.5–15.5)
WBC: 33.2 10*3/uL — ABNORMAL HIGH (ref 4.0–10.5)
nRBC: 0 % (ref 0.0–0.2)
nRBC: 0 /100 WBC

## 2022-06-22 LAB — COMPREHENSIVE METABOLIC PANEL
ALT: 10 U/L (ref 0–44)
AST: 15 U/L (ref 15–41)
Albumin: 2.6 g/dL — ABNORMAL LOW (ref 3.5–5.0)
Alkaline Phosphatase: 102 U/L (ref 38–126)
Anion gap: 23 — ABNORMAL HIGH (ref 5–15)
BUN: 39 mg/dL — ABNORMAL HIGH (ref 8–23)
CO2: 22 mmol/L (ref 22–32)
Calcium: 8.5 mg/dL — ABNORMAL LOW (ref 8.9–10.3)
Chloride: 92 mmol/L — ABNORMAL LOW (ref 98–111)
Creatinine, Ser: 6.16 mg/dL — ABNORMAL HIGH (ref 0.61–1.24)
GFR, Estimated: 8 mL/min — ABNORMAL LOW (ref >60)
Glucose, Bld: 86 mg/dL (ref 70–99)
Potassium: 4.3 mmol/L (ref 3.5–5.1)
Sodium: 137 mmol/L (ref 135–145)
Total Bilirubin: 1.8 mg/dL — ABNORMAL HIGH (ref 0.3–1.2)
Total Protein: 6.2 g/dL — ABNORMAL LOW (ref 6.5–8.1)

## 2022-06-22 LAB — MAGNESIUM: Magnesium: 1.7 mg/dL (ref 1.7–2.4)

## 2022-06-22 LAB — AMMONIA: Ammonia: 33 umol/L (ref 9–35)

## 2022-06-22 LAB — PHOSPHORUS: Phosphorus: 5.2 mg/dL — ABNORMAL HIGH (ref 2.5–4.6)

## 2022-06-22 LAB — PROCALCITONIN: Procalcitonin: 127.99 ng/mL

## 2022-06-22 LAB — HEPATITIS B SURFACE ANTIGEN: Hepatitis B Surface Ag: NONREACTIVE

## 2022-06-22 MED ORDER — ALPRAZOLAM 0.25 MG PO TABS
0.2500 mg | ORAL_TABLET | Freq: Two times a day (BID) | ORAL | Status: DC | PRN
  Administered 2022-06-23 – 2022-06-24 (×2): 0.25 mg via ORAL
  Filled 2022-06-22 (×2): qty 1

## 2022-06-22 MED ORDER — FENTANYL CITRATE PF 50 MCG/ML IJ SOSY: 12.5000 ug | PREFILLED_SYRINGE | Freq: Once | INTRAMUSCULAR | Status: DC

## 2022-06-22 MED ORDER — VANCOMYCIN HCL 750 MG/150ML IV SOLN
750.0000 mg | INTRAVENOUS | Status: DC
  Filled 2022-06-22: qty 150

## 2022-06-22 MED ORDER — MIDODRINE HCL 5 MG PO TABS
10.0000 mg | ORAL_TABLET | Freq: Two times a day (BID) | ORAL | Status: DC
  Administered 2022-06-22 – 2022-06-24 (×3): 10 mg via ORAL
  Filled 2022-06-22 (×5): qty 2

## 2022-06-22 MED ORDER — IPRATROPIUM-ALBUTEROL 0.5-2.5 (3) MG/3ML IN SOLN: 3.0000 mL | Freq: Four times a day (QID) | RESPIRATORY_TRACT | Status: DC | PRN

## 2022-06-22 MED ORDER — CHLORHEXIDINE GLUCONATE CLOTH 2 % EX PADS
6.0000 | MEDICATED_PAD | Freq: Every day | CUTANEOUS | Status: DC
  Administered 2022-06-23 – 2022-06-29 (×7): 6 via TOPICAL

## 2022-06-22 MED ORDER — HYDROCODONE-ACETAMINOPHEN 5-325 MG PO TABS: 1.0000 | ORAL_TABLET | Freq: Four times a day (QID) | ORAL | Status: DC | PRN

## 2022-06-22 NOTE — ED Notes (Signed)
MD notified of patients blood pressure 90/38

## 2022-06-22 NOTE — Progress Notes (Signed)
PROGRESS NOTE    Jeffrey Payne  [id number] DOB: March 28, 1933 DOA: 06/21/2022 PCP: Jodene Nam, MD   Chief Complaint  Patient presents with   Code Sepsis    Brief Narrative:   Drayk Passey is a 89 y.o. male with medical history significant for chronic combined systolic/diastolic heart failure, incisional disease on hemodialysis on Tuesday, Thursday, Saturday schedule, who is admitted to [facility] on 06/21/2022 with acute [place] respiratory failure in the setting of acute viral bronchitis after presenting from home to [facility] ED complaining of shortness of breath.  His weakness, fatigue, he did test positive for metapneumovirus last admission, workup significant for leukocytosis, he was admitted for further workup.   Assessment & Plan:   Principal Problem:   Acute hypoxic respiratory failure (HCC) Active Problems:   End-stage renal disease on hemodialysis (HCC)   Elevated troponin   Essential hypertension   History of anemia due to chronic kidney disease   Chronic combined systolic and diastolic heart failure (HCC)   GERD (gastroesophageal reflux disease)   Leukocytosis   Acute bronchitis   Shortness of breath   Acute metabolic encephalopathy   Severe sepsis (HCC)   Allergic rhinitis    Acute hypoxic respiratory failure due acute viral bronchitis with metapneumovirus  -No evidence of pneumonia, or volume overload, or PE on CTA chest -No active wheezing, no occasion for steroids. -Continue DuoNebs and as needed albuterol -He was encouraged with flutter valve  Sepsis, present on admission, due to to bacteremia -Sepsis present on admission, leukocytosis, tachypnea, tachycardia and elevated lactic acid at 2.4 -Workup significant for bacteremia 2 out of 2 blood cultures, growing gram-positive cocci, -Empirically on vancomycin and cefepime, will discontinue cefepime and continue with vancomycin  Acute metabolic encephalopathy -This has resolved, mentation back to  baseline  ESRD: -Consulted, on TTS schedule, for HD today  Hypertension/hypotension -Blood pressure is soft, actually requiring midodrine, continue to hold home medications     DVT prophylaxis:  Code Status: Full Family Communication: Family at bedside upon my evaluation earlier today, I tried to reach wife by phone, left her voicemail. Disposition:   Status is: Inpatient    Consultants:  renal   Subjective:  Patient reports generalized weakness, fatigue, otherwise denies chest pain, fever or chills, he reports some dyspnea though. Objective: Vitals:   06/22/22 0615 06/22/22 0745 06/22/22 0933 06/22/22 1045  BP: 126/68 112/65  (!) 90/38  Pulse:  85  77  Resp:  (!) 21  (!) 21  Temp:   (!) 97.5 F (36.4 C)   TempSrc:   Oral   SpO2:  98%  97%  Weight:      Height:        Intake/Output Summary (Last 24 hours) at 06/22/2022 1303 Last data filed at 06/21/2022 1734 Gross per 24 hour  Intake 505.28 ml  Output --  Net 505.28 ml   Filed Weights   06/21/22 1225  Weight: 72.1 kg    Examination:  Awake Alert, Oriented X 3, frail, deconditioned Symmetrical Chest wall movement, Good air movement bilaterally, CTAB RRR,No Gallops,Rubs or new Murmurs, No Parasternal Heave +ve B.Sounds, Abd Soft, No tenderness, No rebound - guarding or rigidity. No Cyanosis, Clubbing or edema, No new Rash or bruise      Data Reviewed: I have personally reviewed following labs and imaging studies  CBC: Recent Labs  Lab 06/21/22 1215 06/22/22 0340 06/22/22 0351  WBC 24.4* 33.2*  --   NEUTROABS 22.7* 31.2*  --   HGB  14.7 13.0 13.9  HCT 44.5 40.0 41.0  MCV 102.5* 102.6*  --   PLT 244 227  --     Basic Metabolic Panel: Recent Labs  Lab 06/21/22 1215 06/21/22 2245 06/22/22 0340 06/22/22 0351  NA 141  --  137 133*  K 3.6  --  4.3 5.6*  CL 96*  --  92*  --   CO2 24  --  22  --   GLUCOSE 74  --  86  --   BUN 28*  --  39*  --   CREATININE 5.60*  --  6.16*  --   CALCIUM 9.1   --  8.5*  --   MG  --  1.9 1.7  --   PHOS  --   --  5.2*  --     GFR: Estimated Creatinine Clearance: 7.9 mL/min (A) (by C-G formula based on SCr of 6.16 mg/dL (H)).  Liver Function Tests: Recent Labs  Lab 06/21/22 1215 06/22/22 0340  AST 16 15  ALT 9 10  ALKPHOS 112 102  BILITOT 1.9* 1.8*  PROT 6.6 6.2*  ALBUMIN 3.0* 2.6*    CBG: No results for input(s): "GLUCAP" in the last 168 hours.   Recent Results (from the past 240 hour(s))  Resp panel by RT-PCR (RSV, Flu A&B, Covid) Anterior Nasal Swab     Status: None   Collection Time: 06/14/22 12:11 PM   Specimen: Anterior Nasal Swab  Result Value Ref Range Status   SARS Coronavirus 2 by RT PCR NEGATIVE NEGATIVE Final   Influenza A by PCR NEGATIVE NEGATIVE Final   Influenza B by PCR NEGATIVE NEGATIVE Final    Comment: (NOTE) The Xpert Xpress SARS-CoV-2/FLU/RSV plus assay is intended as an aid in the diagnosis of influenza from Nasopharyngeal swab specimens and should not be used as a sole basis for treatment. Nasal washings and aspirates are unacceptable for Xpert Xpress SARS-CoV-2/FLU/RSV testing.  Fact Sheet for Patients: EntrepreneurPulse.com.au  Fact Sheet for Healthcare Providers: IncredibleEmployment.be  This test is not yet approved or cleared by the [place] FDA and has been authorized for detection and/or diagnosis of SARS-CoV-2 by FDA under an Emergency Use Authorization (EUA). This EUA will remain in effect (meaning this test can be used) for the duration of the COVID-19 declaration under Section 564(b)(1) of the Act, 21 U.S.C. section 360bbb-3(b)(1), unless the authorization is terminated or revoked.     Resp Syncytial Virus by PCR NEGATIVE NEGATIVE Final    Comment: (NOTE) Fact Sheet for Patients: EntrepreneurPulse.com.au  Fact Sheet for Healthcare Providers: IncredibleEmployment.be  This test is not yet approved or cleared  by the [place] FDA and has been authorized for detection and/or diagnosis of SARS-CoV-2 by FDA under an Emergency Use Authorization (EUA). This EUA will remain in effect (meaning this test can be used) for the duration of the COVID-19 declaration under Section 564(b)(1) of the Act, 21 U.S.C. section 360bbb-3(b)(1), unless the authorization is terminated or revoked.  Performed at [facility], [place] [address] [place], [place] [address]   Respiratory (~20 pathogens) panel by PCR     Status: Abnormal   Collection Time: 06/14/22  2:32 PM   Specimen: Nasopharyngeal Swab; Respiratory  Result Value Ref Range Status   Adenovirus NOT DETECTED NOT DETECTED Final   Coronavirus 229E NOT DETECTED NOT DETECTED Final    Comment: (NOTE) The Coronavirus on the Respiratory Panel, DOES NOT test for the novel  Coronavirus (2019 nCoV)    Coronavirus  HKU1 NOT DETECTED NOT DETECTED Final   Coronavirus NL63 NOT DETECTED NOT DETECTED Final   Coronavirus OC43 NOT DETECTED NOT DETECTED Final   Metapneumovirus DETECTED (A) NOT DETECTED Final   Rhinovirus / Enterovirus NOT DETECTED NOT DETECTED Final   Influenza A NOT DETECTED NOT DETECTED Final   Influenza B NOT DETECTED NOT DETECTED Final   Parainfluenza Virus 1 NOT DETECTED NOT DETECTED Final   Parainfluenza Virus 2 NOT DETECTED NOT DETECTED Final   Parainfluenza Virus 3 NOT DETECTED NOT DETECTED Final   Parainfluenza Virus 4 NOT DETECTED NOT DETECTED Final   Respiratory Syncytial Virus NOT DETECTED NOT DETECTED Final   Bordetella pertussis NOT DETECTED NOT DETECTED Final   Bordetella Parapertussis NOT DETECTED NOT DETECTED Final   Chlamydophila pneumoniae NOT DETECTED NOT DETECTED Final   Mycoplasma pneumoniae NOT DETECTED NOT DETECTED Final    Comment: Performed at [facility], [place] [address], [place], [place] [address]  Blood Culture (routine x 2)     Status: None (Preliminary result)   Collection Time: 06/21/22 12:15 PM    Specimen: BLOOD  Result Value Ref Range Status   Specimen Description BLOOD LEFT ANTECUBITAL  Final   Special Requests   Final    BOTTLES DRAWN AEROBIC AND ANAEROBIC Blood Culture adequate volume   Culture  Setup Time   Final    GRAM POSITIVE COCCI IN BOTH AEROBIC AND ANAEROBIC BOTTLES Organism ID to follow CRITICAL RESULT CALLED TO, READ BACK BY AND VERIFIED WITHEzekiel Slocumb PHARMD, AT [id number] 06/22/22 [place] Performed at [facility], [place] [address] [place], [place] [address]    Culture GRAM POSITIVE COCCI  Final   Report Status PENDING  Incomplete  Resp panel by RT-PCR (RSV, Flu A&B, Covid) Anterior Nasal Swab     Status: None   Collection Time: 06/21/22 12:15 PM   Specimen: Anterior Nasal Swab  Result Value Ref Range Status   SARS Coronavirus 2 by RT PCR NEGATIVE NEGATIVE Final   Influenza A by PCR NEGATIVE NEGATIVE Final   Influenza B by PCR NEGATIVE NEGATIVE Final    Comment: (NOTE) The Xpert Xpress SARS-CoV-2/FLU/RSV plus assay is intended as an aid in the diagnosis of influenza from Nasopharyngeal swab specimens and should not be used as a sole basis for treatment. Nasal washings and aspirates are unacceptable for Xpert Xpress SARS-CoV-2/FLU/RSV testing.  Fact Sheet for Patients: EntrepreneurPulse.com.au  Fact Sheet for Healthcare Providers: IncredibleEmployment.be  This test is not yet approved or cleared by the [place] FDA and has been authorized for detection and/or diagnosis of SARS-CoV-2 by FDA under an Emergency Use Authorization (EUA). This EUA will remain in effect (meaning this test can be used) for the duration of the COVID-19 declaration under Section 564(b)(1) of the Act, 21 U.S.C. section 360bbb-3(b)(1), unless the authorization is terminated or revoked.     Resp Syncytial Virus by PCR NEGATIVE NEGATIVE Final    Comment: (NOTE) Fact Sheet for Patients: EntrepreneurPulse.com.au  Fact  Sheet for Healthcare Providers: IncredibleEmployment.be  This test is not yet approved or cleared by the [place] FDA and has been authorized for detection and/or diagnosis of SARS-CoV-2 by FDA under an Emergency Use Authorization (EUA). This EUA will remain in effect (meaning this test can be used) for the duration of the COVID-19 declaration under Section 564(b)(1) of the Act, 21 U.S.C. section 360bbb-3(b)(1), unless the authorization is terminated or revoked.  Performed at [facility], [place] [address], [place], [place]  [address]   Blood Culture ID Panel (Reflexed)     Status: None   Collection Time: 06/21/22 12:15 PM  Result Value Ref Range Status   Enterococcus faecalis NOT DETECTED NOT DETECTED Final   Enterococcus Faecium NOT DETECTED NOT DETECTED Final   Listeria monocytogenes NOT DETECTED NOT DETECTED Final   Staphylococcus species NOT DETECTED NOT DETECTED Final   Staphylococcus aureus (BCID) NOT DETECTED NOT DETECTED Final   Staphylococcus epidermidis NOT DETECTED NOT DETECTED Final   Staphylococcus lugdunensis NOT DETECTED NOT DETECTED Final   Streptococcus species NOT DETECTED NOT DETECTED Final   Streptococcus agalactiae NOT DETECTED NOT DETECTED Final   Streptococcus pneumoniae NOT DETECTED NOT DETECTED Final   Streptococcus pyogenes NOT DETECTED NOT DETECTED Final   A.calcoaceticus-baumannii NOT DETECTED NOT DETECTED Final   Bacteroides fragilis NOT DETECTED NOT DETECTED Final   Enterobacterales NOT DETECTED NOT DETECTED Final   Enterobacter cloacae complex NOT DETECTED NOT DETECTED Final   Escherichia coli NOT DETECTED NOT DETECTED Final   Klebsiella aerogenes NOT DETECTED NOT DETECTED Final   Klebsiella oxytoca NOT DETECTED NOT DETECTED Final   Klebsiella pneumoniae NOT DETECTED NOT DETECTED Final   Proteus species NOT DETECTED NOT DETECTED Final   Salmonella species NOT DETECTED NOT DETECTED Final   Serratia marcescens NOT DETECTED  NOT DETECTED Final   Haemophilus influenzae NOT DETECTED NOT DETECTED Final   Neisseria meningitidis NOT DETECTED NOT DETECTED Final   Pseudomonas aeruginosa NOT DETECTED NOT DETECTED Final   Stenotrophomonas maltophilia NOT DETECTED NOT DETECTED Final   Candida albicans NOT DETECTED NOT DETECTED Final   Candida auris NOT DETECTED NOT DETECTED Final   Candida glabrata NOT DETECTED NOT DETECTED Final   Candida krusei NOT DETECTED NOT DETECTED Final   Candida parapsilosis NOT DETECTED NOT DETECTED Final   Candida tropicalis NOT DETECTED NOT DETECTED Final   Cryptococcus neoformans/gattii NOT DETECTED NOT DETECTED Final    Comment: Performed at [facility], [address] [place], [place] [address]  MRSA Next Gen by PCR, Nasal     Status: None   Collection Time: 06/21/22  4:53 PM   Specimen: Nasal Mucosa; Nasal Swab  Result Value Ref Range Status   MRSA by PCR Next Gen NOT DETECTED NOT DETECTED Final    Comment: (NOTE) The GeneXpert MRSA Assay (FDA approved for NASAL specimens only), is one component of a comprehensive MRSA colonization surveillance program. It is not intended to diagnose MRSA infection nor to guide or monitor treatment for MRSA infections. Test performance is not FDA approved in patients less than 95 years old. Performed at [facility], [place] [address], [place], [place] [address]          Radiology Studies: CT Angio Chest PE W and/or Wo Contrast  Result Date: 06/21/2022 CLINICAL DATA:  Dyspnea, hypoxia EXAM: CT ANGIOGRAPHY CHEST WITH CONTRAST TECHNIQUE: Multidetector CT imaging of the chest was performed using the standard protocol during bolus administration of intravenous contrast. Multiplanar CT image reconstructions and MIPs were obtained to evaluate the vascular anatomy. RADIATION DOSE REDUCTION: This exam was performed according to the departmental dose-optimization program which includes automated exposure control, adjustment of the mA and/or kV  according to patient size and/or use of iterative reconstruction technique. CONTRAST:  54m OMNIPAQUE IOHEXOL 350 MG/ML SOLN COMPARISON:  02/02/2022 FINDINGS: Cardiovascular: There is adequate opacification of the pulmonary arterial tree. No intraluminal filling defect identified to suggest acute pulmonary embolism. The central pulmonary arteries are enlarged in keeping with changes of  pulmonary arterial hypertension. Global cardiac size is mildly enlarged. Small pericardial effusion. Mild coronary artery calcification. Extensive atherosclerotic calcification within the thoracic aorta. No aortic aneurysm. Mediastinum/Nodes: No enlarged mediastinal, hilar, or axillary lymph nodes. Thyroid gland, trachea, and esophagus demonstrate no significant findings. Lungs/Pleura: Mild right basilar dependent atelectasis. Lungs are otherwise clear save for a benign calcified granuloma within the left upper lobe. No pneumothorax or pleural effusion. Central airways are widely patent. Upper Abdomen: No acute abnormality. Musculoskeletal: No acute bone abnormality. No lytic or blastic bone lesion. Review of the MIP images confirms the above findings. IMPRESSION: 1. No pulmonary embolism. No acute intrathoracic pathology identified. 2. Mild cardiomegaly. Mild coronary artery calcification. 3. Morphologic changes in keeping with pulmonary arterial hypertension. Aortic Atherosclerosis (ICD10-I70.0). Electronically Signed   By: Fidela Salisbury M.D.   On: 06/21/2022 18:59   CT Head Wo Contrast  Result Date: 06/21/2022 CLINICAL DATA:  Altered level of consciousness, short of breath EXAM: CT HEAD WITHOUT CONTRAST TECHNIQUE: Contiguous axial images were obtained from the base of the skull through the vertex without intravenous contrast. RADIATION DOSE REDUCTION: This exam was performed according to the departmental dose-optimization program which includes automated exposure control, adjustment of the mA and/or kV according to patient  size and/or use of iterative reconstruction technique. COMPARISON:  01/29/2021 FINDINGS: Brain: No evidence of acute infarct or hemorrhage. The lateral ventricles and midline structures are stable. Stable coarse calcifications within the central pons. Diffuse cerebral atrophy, with continued prominence of the extra-axial CSF spaces. No acute extra-axial fluid collection. No mass effect. Vascular: Stable atherosclerosis.  No hyperdense vessel. Skull: Normal. Negative for fracture or focal lesion. Sinuses/Orbits: Small bilateral mastoid effusions are unchanged. Remaining paranasal sinuses are clear. Other: None. IMPRESSION: 1. Stable head CT, no acute intracranial process. Electronically Signed   By: Randa Ngo M.D.   On: 06/21/2022 18:41   DG Chest Port 1 View  Result Date: 06/21/2022 CLINICAL DATA:  Sepsis. EXAM: PORTABLE CHEST 1 VIEW COMPARISON:  June 14, 2022. FINDINGS: Stable cardiomediastinal silhouette. Both lungs are clear. The visualized skeletal structures are unremarkable. IMPRESSION: No active disease. Aortic Atherosclerosis (ICD10-I70.0). Electronically Signed   By: Marijo Conception M.D.   On: 06/21/2022 12:39        Scheduled Meds:  Chlorhexidine Gluconate Cloth  6 each Topical Q0600   ipratropium-albuterol  3 mL Nebulization Q6H   loratadine  10 mg Oral Daily   midodrine  10 mg Oral BID WC   Continuous Infusions:  vancomycin       LOS: 1 day      Phillips Climes, MD [facility]   To contact the attending provider between 7A-7P or the covering provider during after hours 7P-7A, please log into the web site [email or web address] and access using universal [place] password for that web site. If you do not have the password, please call the hospital operator.  06/22/2022, 1:03 PM

## 2022-06-22 NOTE — Progress Notes (Deleted)
PT at this time refusing dressing change. I asked what time he may want to do dressing/wound care. He stated around [facility] aligning with availability of Fentanyl dose. I had just given his PRN Fentanyl and his scheduled Oxycodone and offered to delay dressing change to let meds be more effective when he refused.

## 2022-06-22 NOTE — ED Notes (Signed)
ED TO INPATIENT HANDOFF REPORT  ED Nurse Name and Phone #:  Vikki Ports [phone]  S Name/Age/Gender Jeffrey Payne 89 y.o. male Room/Bed: 009C/009C  Code Status   Code Status: Full Code  Home/SNF/Other Home Patient oriented to: self, place, time, and situation Is this baseline? Yes   Triage Complete: Triage complete  Chief Complaint Acute hypoxic respiratory failure ([place]) [J96.01]  Triage Note Pt bib ems from [facility]; discharged from hospital on 20th, diagnosed with acute resp failure hypoxia, sent home with mucinex; today c/o generalized weakness, tylenol given PTA; 99.5 F, warm to touch; 89% ra, 2L [place] 97%, bp 90/50, 200cc fluid given PTA, hx chf and ESRD, dialysis T TH Sa, cbg 94, capnography 28; pt lethargic, RR 25, last bp 127/60; dialysis  RA restricted, last dialyzed Saturday   Allergies No Known Allergies  Level of Care/Admitting Diagnosis ED Disposition     ED Disposition  [place]: [name] [100100]  Level of Care: Progressive [102]  Admit to Progressive based on following criteria: MULTISYSTEM THREATS such as stable sepsis, metabolic/electrolyte imbalance with or without encephalopathy that is responding to early treatment.  May admit patient to [facility] or [facility] if equivalent level of care is available:: No  Covid Evaluation: Asymptomatic - no recent exposure (last 10 days) testing not required  Diagnosis: Acute hypoxic respiratory failure [facility]) [id number]  Admitting Physician: Rhetta Mura [id number]  Attending Physician: Rhetta Mura [id number]  Certification:: I certify this patient will need inpatient services for at least 2 midnights  Estimated Length of Stay: 2          B Medical/Surgery History Past Medical History:  Diagnosis Date   Anemia    Arthritis    knees   Cancer ([place])    prostate   ED (erectile dysfunction)    GERD (gastroesophageal  reflux disease)    ocassional    Hard of hearing    Hypertension    Hypertriglyceridemia    Renal failure    Dialysis dependent   Past Surgical History:  Procedure Laterality Date   A/V FISTULAGRAM N/A 07/25/2020   Procedure: A/V FISTULAGRAM - Left Arm;  Surgeon: Elam Dutch, MD;  Location: [facility];  Service: Cardiovascular;  Laterality: N/A;   AV FISTULA PLACEMENT Left 03/04/2020   Procedure: BRACHIOCEPHALIC ARTERIOVENOUS (AV) FISTULA CREATION LEFT;  Surgeon: Elam Dutch, MD;  Location: [facility] OR;  Service: Vascular;  Laterality: Left;   AV FISTULA PLACEMENT Right 07/28/2020   Procedure: RIGHT  ARM ARTERIOVENOUS FISTULA CREATION;  Surgeon: Elam Dutch, MD;  Location: [facility];  Service: Vascular;  Laterality: Right;   COLONOSCOPY     HIP ARTHROPLASTY Left 01/30/2021   Procedure: ARTHROPLASTY BIPOLAR HIP (HEMIARTHROPLASTY);  Surgeon: Vanetta Mulders, MD;  Location: [place] [facility];  Service: Orthopedics;  Laterality: Left;   IR AV DIALY SHUNT INTRO NEEDLE/INTRACATH INITIAL W/PTA/IMG RIGHT Right 02/09/2021   IR FLUORO GUIDE CV LINE RIGHT  02/11/2021   IR US GUIDE VASC ACCESS RIGHT  02/09/2021   IR US GUIDE VASC ACCESS RIGHT  02/11/2021   renal cyst biopsy     TONSILLECTOMY       A IV Location/Drains/Wounds Patient Lines/Drains/Airways Status     Active Line/Drains/Airways     Name Placement date Placement time Site Days   Peripheral IV 06/21/22 20 G Left Antecubital 06/21/22  1213  Antecubital  1   Peripheral  IV 06/21/22 20 G Anterior;Left Forearm 06/21/22  --  Forearm  1   Fistula / Graft Right Forearm Arteriovenous fistula --  --  Forearm  --   Urethral Catheter --  --  --  --            Intake/Output Last 24 hours  Intake/Output Summary (Last 24 hours) at 06/22/2022 1131 Last data filed at 06/21/2022 1734 Gross per 24 hour  Intake 505.28 ml  Output --  Net 505.28 ml    Labs/Imaging Results for orders placed or performed during the hospital encounter of  06/21/22 (from the past 48 hour(s))  Lactic acid, plasma     Status: Abnormal   Collection Time: 06/21/22 12:15 PM  Result Value Ref Range   Lactic Acid, Venous 2.4 (HH) 0.5 - 1.9 mmol/L    Comment: CRITICAL RESULT CALLED TO, READ BACK BY AND VERIFIED WITH D.GUILBEAULT RN 1415 06/21/22 MCCORMICK K Performed at [facility], [place] [address], [place], [place] [address]   Comprehensive metabolic panel     Status: Abnormal   Collection Time: 06/21/22 12:15 PM  Result Value Ref Range   Sodium 141 135 - 145 mmol/L   Potassium 3.6 3.5 - 5.1 mmol/L   Chloride 96 (L) 98 - 111 mmol/L   CO2 24 22 - 32 mmol/L   Glucose, Bld 74 70 - 99 mg/dL    Comment: Glucose reference range applies only to samples taken after fasting for at least 8 hours.   BUN 28 (H) 8 - 23 mg/dL   Creatinine, Ser 5.60 (H) 0.61 - 1.24 mg/dL   Calcium 9.1 8.9 - 10.3 mg/dL   Total Protein 6.6 6.5 - 8.1 g/dL   Albumin 3.0 (L) 3.5 - 5.0 g/dL   AST 16 15 - 41 U/L   ALT 9 0 - 44 U/L   Alkaline Phosphatase 112 38 - 126 U/L   Total Bilirubin 1.9 (H) 0.3 - 1.2 mg/dL   GFR, Estimated 9 (L) >60 mL/min    Comment: (NOTE) Calculated using the CKD-EPI Creatinine Equation (2021)    Anion gap 21 (H) 5 - 15    Comment: ELECTROLYTES REPEATED TO VERIFY Performed at [facility], [place] [address], [place], [place] [address]   CBC with Differential     Status: Abnormal   Collection Time: 06/21/22 12:15 PM  Result Value Ref Range   WBC 24.4 (H) 4.0 - 10.5 K/uL   RBC 4.34 4.22 - 5.81 MIL/uL   Hemoglobin 14.7 13.0 - 17.0 g/dL   HCT 44.5 39.0 - 52.0 %   MCV 102.5 (H) 80.0 - 100.0 fL   MCH 33.9 26.0 - 34.0 pg   MCHC 33.0 30.0 - 36.0 g/dL   RDW 15.9 (H) 11.5 - 15.5 %   Platelets 244 150 - 400 K/uL   nRBC 0.0 0.0 - 0.2 %   Neutrophils Relative % 93 %   Neutro Abs 22.7 (H) 1.7 - 7.7 K/uL   Lymphocytes Relative 1 %   Lymphs Abs 0.4 (L) 0.7 - 4.0 K/uL   Monocytes Relative 4 %   Monocytes Absolute 0.9 0.1 - 1.0 K/uL    Eosinophils Relative 1 %   Eosinophils Absolute 0.1 0.0 - 0.5 K/uL   Basophils Relative 0 %   Basophils Absolute 0.1 0.0 - 0.1 K/uL   Immature Granulocytes 1 %   Abs Immature Granulocytes 0.24 (H) 0.00 - 0.07 K/uL    Comment: Performed at [facility],  [address] [place], [place] [address]  Blood Culture (routine x 2)     Status: None (Preliminary result)   Collection Time: 06/21/22 12:15 PM   Specimen: BLOOD  Result Value Ref Range   Specimen Description BLOOD LEFT ANTECUBITAL    Special Requests      BOTTLES DRAWN AEROBIC AND ANAEROBIC Blood Culture adequate volume   Culture  Setup Time      GRAM POSITIVE COCCI IN BOTH AEROBIC AND ANAEROBIC BOTTLES Organism ID to follow CRITICAL RESULT CALLED TO, READ BACK BY AND VERIFIED WITHEzekiel Slocumb PHARMD, AT [id number] 06/22/22 [place] Performed at [facility], [place] [address], [place], [place] [address]    Culture GRAM POSITIVE COCCI    Report Status PENDING   Brain natriuretic peptide     Status: Abnormal   Collection Time: 06/21/22 12:15 PM  Result Value Ref Range   B Natriuretic Peptide >4,500.0 (H) 0.0 - 100.0 pg/mL    Comment: Performed at [facility] [address] [place], [place] [address]  Troponin I (High Sensitivity)     Status: Abnormal   Collection Time: 06/21/22 12:15 PM  Result Value Ref Range   Troponin I (High Sensitivity) 93 (H) <18 ng/L    Comment: (NOTE) Elevated high sensitivity troponin I (hsTnI) values and significant  changes across serial measurements may suggest ACS but many other  chronic and acute conditions are known to elevate hsTnI results.  Refer to the "Links" section for chest pain algorithms and additional  guidance. Performed at [facility], [place] [address] [place], [place] [address]   Resp panel by RT-PCR (RSV, Flu A&B, Covid) Anterior Nasal Swab     Status: None   Collection Time: 06/21/22 12:15 PM   Specimen: Anterior Nasal Swab  Result Value Ref Range   SARS  Coronavirus 2 by RT PCR NEGATIVE NEGATIVE   Influenza A by PCR NEGATIVE NEGATIVE   Influenza B by PCR NEGATIVE NEGATIVE    Comment: (NOTE) The Xpert Xpress SARS-CoV-2/FLU/RSV plus assay is intended as an aid in the diagnosis of influenza from Nasopharyngeal swab specimens and should not be used as a sole basis for treatment. Nasal washings and aspirates are unacceptable for Xpert Xpress SARS-CoV-2/FLU/RSV testing.  Fact Sheet for Patients: EntrepreneurPulse.com.au  Fact Sheet for Healthcare Providers: IncredibleEmployment.be  This test is not yet approved or cleared by the [place] FDA and has been authorized for detection and/or diagnosis of SARS-CoV-2 by FDA under an Emergency Use Authorization (EUA). This EUA will remain in effect (meaning this test can be used) for the duration of the COVID-19 declaration under Section 564(b)(1) of the Act, 21 U.S.C. section 360bbb-3(b)(1), unless the authorization is terminated or revoked.     Resp Syncytial Virus by PCR NEGATIVE NEGATIVE    Comment: (NOTE) Fact Sheet for Patients: EntrepreneurPulse.com.au  Fact Sheet for Healthcare Providers: IncredibleEmployment.be  This test is not yet approved or cleared by the [place] FDA and has been authorized for detection and/or diagnosis of SARS-CoV-2 by FDA under an Emergency Use Authorization (EUA). This EUA will remain in effect (meaning this test can be used) for the duration of the COVID-19 declaration under Section 564(b)(1) of the Act, 21 U.S.C. section 360bbb-3(b)(1), unless the authorization is terminated or revoked.  Performed at [facility], [place] [address], [place], [place] [address]   Blood Culture ID Panel (Reflexed)     Status: None   Collection Time: 06/21/22 12:15 PM  Result Value Ref Range   Enterococcus faecalis NOT DETECTED NOT DETECTED   Enterococcus Faecium NOT DETECTED NOT  DETECTED   Listeria monocytogenes NOT DETECTED NOT DETECTED   Staphylococcus species NOT DETECTED NOT DETECTED   Staphylococcus aureus (BCID) NOT DETECTED NOT DETECTED   Staphylococcus epidermidis NOT DETECTED NOT DETECTED   Staphylococcus lugdunensis NOT DETECTED NOT DETECTED   Streptococcus species NOT DETECTED NOT DETECTED   Streptococcus agalactiae NOT DETECTED NOT DETECTED   Streptococcus pneumoniae NOT DETECTED NOT DETECTED   Streptococcus pyogenes NOT DETECTED NOT DETECTED   A.calcoaceticus-baumannii NOT DETECTED NOT DETECTED   Bacteroides fragilis NOT DETECTED NOT DETECTED   Enterobacterales NOT DETECTED NOT DETECTED   Enterobacter cloacae complex NOT DETECTED NOT DETECTED   Escherichia coli NOT DETECTED NOT DETECTED   Klebsiella aerogenes NOT DETECTED NOT DETECTED   Klebsiella oxytoca NOT DETECTED NOT DETECTED   Klebsiella pneumoniae NOT DETECTED NOT DETECTED   Proteus species NOT DETECTED NOT DETECTED   Salmonella species NOT DETECTED NOT DETECTED   Serratia marcescens NOT DETECTED NOT DETECTED   Haemophilus influenzae NOT DETECTED NOT DETECTED   Neisseria meningitidis NOT DETECTED NOT DETECTED   Pseudomonas aeruginosa NOT DETECTED NOT DETECTED   Stenotrophomonas maltophilia NOT DETECTED NOT DETECTED   Candida albicans NOT DETECTED NOT DETECTED   Candida auris NOT DETECTED NOT DETECTED   Candida glabrata NOT DETECTED NOT DETECTED   Candida krusei NOT DETECTED NOT DETECTED   Candida parapsilosis NOT DETECTED NOT DETECTED   Candida tropicalis NOT DETECTED NOT DETECTED   Cryptococcus neoformans/gattii NOT DETECTED NOT DETECTED    Comment: Performed at [facility], [address] [place], [place] [address]  Lactic acid, plasma     Status: None   Collection Time: 06/21/22  3:08 PM  Result Value Ref Range   Lactic Acid, Venous 1.1 0.5 - 1.9 mmol/L    Comment: Performed at [facility] [address], [place], [place] [address]  Troponin I (High Sensitivity)      Status: Abnormal   Collection Time: 06/21/22  3:08 PM  Result Value Ref Range   Troponin I (High Sensitivity) 81 (H) <18 ng/L    Comment: (NOTE) Elevated high sensitivity troponin I (hsTnI) values and significant  changes across serial measurements may suggest ACS but many other  chronic and acute conditions are known to elevate hsTnI results.  Refer to the "Links" section for chest pain algorithms and additional  guidance. Performed at [facility], [place] [address] [place], [place] [address]   MRSA Next Gen by PCR, Nasal     Status: None   Collection Time: 06/21/22  4:53 PM   Specimen: Nasal Mucosa; Nasal Swab  Result Value Ref Range   MRSA by PCR Next Gen NOT DETECTED NOT DETECTED    Comment: (NOTE) The GeneXpert MRSA Assay (FDA approved for NASAL specimens only), is one component of a comprehensive MRSA colonization surveillance program. It is not intended to diagnose MRSA infection nor to guide or monitor treatment for MRSA infections. Test performance is not FDA approved in patients less than 61 years old. Performed at [facility], [place] [address] [place], [place] [address]   Magnesium     Status: None   Collection Time: 06/21/22 10:45 PM  Result Value Ref Range   Magnesium 1.9 1.7 - 2.4 mg/dL    Comment: Performed at [facility] [address], [place], [place] [address]  Procalcitonin     Status:  None   Collection Time: 06/21/22 10:45 PM  Result Value Ref Range   Procalcitonin 127.99 ng/mL    Comment:        Interpretation: PCT >= 10 ng/mL: Important systemic inflammatory response, almost exclusively due to severe bacterial sepsis or septic shock. (NOTE)       Sepsis PCT Algorithm           Lower Respiratory Tract                                      Infection PCT Algorithm    ----------------------------     ----------------------------         PCT < 0.25 ng/mL                PCT < 0.10 ng/mL          Strongly encourage              Strongly discourage   discontinuation of antibiotics    initiation of antibiotics    ----------------------------     -----------------------------       PCT 0.25 - 0.50 ng/mL            PCT 0.10 - 0.25 ng/mL               OR       >80% decrease in PCT            Discourage initiation of                                            antibiotics      Encourage discontinuation           of antibiotics    ----------------------------     -----------------------------         PCT >= 0.50 ng/mL              PCT 0.26 - 0.50 ng/mL                AND       <80% decrease in PCT             Encourage initiation of                                             antibiotics       Encourage continuation           of antibiotics    ----------------------------     -----------------------------        PCT >= 0.50 ng/mL                  PCT > 0.50 ng/mL               AND         increase in PCT                  Strongly encourage                                      initiation of antibiotics  Strongly encourage escalation           of antibiotics                                     -----------------------------                                           PCT <= 0.25 ng/mL                                                 OR                                        > 80% decrease in PCT                                      Discontinue / Do not initiate                                             antibiotics  Performed at [facility], [place] [address] [place], [place] [address]   Vitamin B12     Status: None   Collection Time: 06/21/22 10:45 PM  Result Value Ref Range   Vitamin B-12 813 180 - 914 pg/mL    Comment: HEMOLYSIS AT THIS LEVEL MAY AFFECT RESULT (NOTE) This assay is not validated for testing neonatal or myeloproliferative syndrome specimens for Vitamin B12 levels. Performed at [facility], [place] [address] [place], [place] [address]   TSH     Status: None   Collection Time:  06/21/22 10:45 PM  Result Value Ref Range   TSH 1.624 0.350 - 4.500 uIU/mL    Comment: Performed by a 3rd Generation assay with a functional sensitivity of <=0.01 uIU/mL. Performed at [facility], [place] [address], [place], [place] [address]   CBC with Differential/Platelet     Status: Abnormal   Collection Time: 06/22/22  3:40 AM  Result Value Ref Range   WBC 33.2 (H) 4.0 - 10.5 K/uL   RBC 3.90 (L) 4.22 - 5.81 MIL/uL   Hemoglobin 13.0 13.0 - 17.0 g/dL   HCT 40.0 39.0 - 52.0 %   MCV 102.6 (H) 80.0 - 100.0 fL   MCH 33.3 26.0 - 34.0 pg   MCHC 32.5 30.0 - 36.0 g/dL   RDW 16.1 (H) 11.5 - 15.5 %   Platelets 227 150 - 400 K/uL   nRBC 0.0 0.0 - 0.2 %   Neutrophils Relative % 94 %   Neutro Abs 31.2 (H) 1.7 - 7.7 K/uL   Lymphocytes Relative 1 %   Lymphs Abs 0.3 (L) 0.7 - 4.0 K/uL   Monocytes Relative 4 %   Monocytes Absolute 1.3 (H) 0.1 - 1.0 K/uL   Eosinophils Relative 1 %   Eosinophils Absolute 0.3 0.0 - 0.5 K/uL   Basophils Relative  0 %   Basophils Absolute 0.0 0.0 - 0.1 K/uL   nRBC 0 0 /100 WBC   Abs Immature Granulocytes 0.00 0.00 - 0.07 K/uL    Comment: Performed at [facility], [place] [address] [place], [place] [address]  Comprehensive metabolic panel     Status: Abnormal   Collection Time: 06/22/22  3:40 AM  Result Value Ref Range   Sodium 137 135 - 145 mmol/L   Potassium 4.3 3.5 - 5.1 mmol/L   Chloride 92 (L) 98 - 111 mmol/L   CO2 22 22 - 32 mmol/L   Glucose, Bld 86 70 - 99 mg/dL    Comment: Glucose reference range applies only to samples taken after fasting for at least 8 hours.   BUN 39 (H) 8 - 23 mg/dL   Creatinine, Ser 6.16 (H) 0.61 - 1.24 mg/dL   Calcium 8.5 (L) 8.9 - 10.3 mg/dL   Total Protein 6.2 (L) 6.5 - 8.1 g/dL   Albumin 2.6 (L) 3.5 - 5.0 g/dL   AST 15 15 - 41 U/L   ALT 10 0 - 44 U/L   Alkaline Phosphatase 102 38 - 126 U/L   Total Bilirubin 1.8 (H) 0.3 - 1.2 mg/dL   GFR, Estimated 8 (L) >60 mL/min    Comment: (NOTE) Calculated using the  CKD-EPI Creatinine Equation (2021)    Anion gap 23 (H) 5 - 15    Comment: ELECTROLYTES REPEATED TO VERIFY Performed at [facility], [address], [place], [place] [address]   Magnesium     Status: None   Collection Time: 06/22/22  3:40 AM  Result Value Ref Range   Magnesium 1.7 1.7 - 2.4 mg/dL    Comment: Performed at [facility] [address] [place], [place] [address]  Phosphorus     Status: Abnormal   Collection Time: 06/22/22  3:40 AM  Result Value Ref Range   Phosphorus 5.2 (H) 2.5 - 4.6 mg/dL    Comment: Performed at [facility] [address], [place], [place] [address]  Ammonia     Status: None   Collection Time: 06/22/22  3:40 AM  Result Value Ref Range   Ammonia 33 9 - 35 umol/L    Comment: Performed at [facility], [place] [address], [place], [place] [address]  Protime-INR     Status: None   Collection Time: 06/22/22  3:40 AM  Result Value Ref Range   Prothrombin Time 15.2 11.4 - 15.2 seconds   INR 1.2 0.8 - 1.2    Comment: (NOTE) INR goal varies based on device and disease states. Performed at [facility], [place] [address], [place], [place] [address]   I-Stat venous blood gas, ED     Status: Abnormal   Collection Time: 06/22/22  3:51 AM  Result Value Ref Range   pH, Ven 7.512 (H) 7.25 - 7.43   pCO2, Ven 31.9 (L) 44 - 60 mmHg   pO2, Ven 62 (H) 32 - 45 mmHg   Bicarbonate 25.5 20.0 - 28.0 mmol/L   TCO2 26 22 - 32 mmol/L   O2 Saturation 94 %   Acid-Base Excess 3.0 (H) 0.0 - 2.0 mmol/L   Sodium 133 (L) 135 - 145 mmol/L   Potassium 5.6 (H) 3.5 - 5.1 mmol/L   Calcium, Ion 0.97 (L) 1.15 - 1.40 mmol/L   HCT 41.0 39.0 - 52.0 %   Hemoglobin 13.9 13.0 - 17.0 g/dL  Sample type VENOUS    CT Angio Chest PE W and/or Wo Contrast  Result Date: 06/21/2022 CLINICAL DATA:  Dyspnea, hypoxia EXAM: CT ANGIOGRAPHY CHEST WITH CONTRAST TECHNIQUE: Multidetector CT imaging of the chest was performed using the standard protocol during bolus  administration of intravenous contrast. Multiplanar CT image reconstructions and MIPs were obtained to evaluate the vascular anatomy. RADIATION DOSE REDUCTION: This exam was performed according to the departmental dose-optimization program which includes automated exposure control, adjustment of the mA and/or kV according to patient size and/or use of iterative reconstruction technique. CONTRAST:  53m OMNIPAQUE IOHEXOL 350 MG/ML SOLN COMPARISON:  02/02/2022 FINDINGS: Cardiovascular: There is adequate opacification of the pulmonary arterial tree. No intraluminal filling defect identified to suggest acute pulmonary embolism. The central pulmonary arteries are enlarged in keeping with changes of pulmonary arterial hypertension. Global cardiac size is mildly enlarged. Small pericardial effusion. Mild coronary artery calcification. Extensive atherosclerotic calcification within the thoracic aorta. No aortic aneurysm. Mediastinum/Nodes: No enlarged mediastinal, hilar, or axillary lymph nodes. Thyroid gland, trachea, and esophagus demonstrate no significant findings. Lungs/Pleura: Mild right basilar dependent atelectasis. Lungs are otherwise clear save for a benign calcified granuloma within the left upper lobe. No pneumothorax or pleural effusion. Central airways are widely patent. Upper Abdomen: No acute abnormality. Musculoskeletal: No acute bone abnormality. No lytic or blastic bone lesion. Review of the MIP images confirms the above findings. IMPRESSION: 1. No pulmonary embolism. No acute intrathoracic pathology identified. 2. Mild cardiomegaly. Mild coronary artery calcification. 3. Morphologic changes in keeping with pulmonary arterial hypertension. Aortic Atherosclerosis (ICD10-I70.0). Electronically Signed   By: AFidela SalisburyM.D.   On: 06/21/2022 18:59   CT Head Wo Contrast  Result Date: 06/21/2022 CLINICAL DATA:  Altered level of consciousness, short of breath EXAM: CT HEAD WITHOUT CONTRAST TECHNIQUE:  Contiguous axial images were obtained from the base of the skull through the vertex without intravenous contrast. RADIATION DOSE REDUCTION: This exam was performed according to the departmental dose-optimization program which includes automated exposure control, adjustment of the mA and/or kV according to patient size and/or use of iterative reconstruction technique. COMPARISON:  01/29/2021 FINDINGS: Brain: No evidence of acute infarct or hemorrhage. The lateral ventricles and midline structures are stable. Stable coarse calcifications within the central pons. Diffuse cerebral atrophy, with continued prominence of the extra-axial CSF spaces. No acute extra-axial fluid collection. No mass effect. Vascular: Stable atherosclerosis.  No hyperdense vessel. Skull: Normal. Negative for fracture or focal lesion. Sinuses/Orbits: Small bilateral mastoid effusions are unchanged. Remaining paranasal sinuses are clear. Other: None. IMPRESSION: 1. Stable head CT, no acute intracranial process. Electronically Signed   By: MRanda NgoM.D.   On: 06/21/2022 18:41   DG Chest Port 1 View  Result Date: 06/21/2022 CLINICAL DATA:  Sepsis. EXAM: PORTABLE CHEST 1 VIEW COMPARISON:  June 14, 2022. FINDINGS: Stable cardiomediastinal silhouette. Both lungs are clear. The visualized skeletal structures are unremarkable. IMPRESSION: No active disease. Aortic Atherosclerosis (ICD10-I70.0). Electronically Signed   By: JMarijo ConceptionM.D.   On: 06/21/2022 12:39    Pending Labs Unresulted Labs (From admission, onward)     Start     Ordered   06/22/22 0834  Hepatitis B surface antigen  (New Admission Hemo Labs (Hepatitis B))  Once,   R        06/22/22 0834   06/22/22 0834  Hepatitis B surface antibody,quantitative  (New Admission Hemo Labs (Hepatitis B))  Once,   R        06/22/22 [id number]  06/22/22 0500  Blood gas, venous  Tomorrow morning,   R        06/21/22 2013   Signed and Held  Renal function panel  Once,   R         Signed and Held   Signed and Held  CBC  Once,   R        Signed and Held            Vitals/Pain Today's Vitals   06/22/22 0615 06/22/22 0745 06/22/22 0933 06/22/22 1045  BP: 126/68 112/65  (!) 90/38  Pulse:  85  77  Resp:  (!) 21  (!) 21  Temp:   (!) 97.5 F (36.4 C)   TempSrc:   Oral   SpO2:  98%  97%  Weight:      Height:        Isolation Precautions No active isolations  Medications Medications  acetaminophen (TYLENOL) tablet 650 mg (has no administration in time range)    Or  acetaminophen (TYLENOL) suppository 650 mg (has no administration in time range)  melatonin tablet 3 mg (has no administration in time range)  loratadine (CLARITIN) tablet 10 mg (10 mg Oral Given 06/22/22 1008)  ipratropium-albuterol (DUONEB) 0.5-2.5 (3) MG/3ML nebulizer solution 3 mL (3 mLs Nebulization Given 06/22/22 0741)  albuterol (PROVENTIL) (2.5 MG/3ML) 0.083% nebulizer solution 2.5 mg (has no administration in time range)  benzonatate (TESSALON) capsule 200 mg (has no administration in time range)  Chlorhexidine Gluconate Cloth 2 % PADS 6 each (0 each Topical Hold 06/22/22 0953)  vancomycin (VANCOREADY) IVPB 750 mg/150 mL (has no administration in time range)  midodrine (PROAMATINE) tablet 10 mg (has no administration in time range)  metroNIDAZOLE (FLAGYL) IVPB 500 mg (0 mg Intravenous Stopped 06/21/22 1636)  ceFEPIme (MAXIPIME) 1 g in sodium chloride 0.9 % 100 mL IVPB (0 g Intravenous Stopped 06/21/22 1524)  vancomycin (VANCOREADY) IVPB 1500 mg/300 mL (0 mg Intravenous Stopped 06/21/22 1734)  iohexol (OMNIPAQUE) 350 MG/ML injection 75 mL (75 mLs Intravenous Contrast Given 06/21/22 1828)    Mobility walks with device     Focused Assessments Renal Assessment Handoff:  Hemodialysis Schedule: Hemodialysis Schedule: Tuesday/Thursday/Saturday Last Hemodialysis date and time: Saturday 2/24   Restricted appendage: right arm   R Recommendations: See Admitting Provider Note  Report given  to:   Additional Notes:  Pt able to ambulate with walker. Wife at bedside, needs frequent emotional support and reassurance

## 2022-06-22 NOTE — Progress Notes (Signed)
Received patient in bed to unit.  Alert and oriented.  Informed consent signed and in chart.   [place] duration:1h 54mn  Patient had c/o pain and sob,Vs stable,rapid was called and did evaluate. Pt has new orders for medication in chart. Transported back to the room  Alert, without acute distress.  Hand-off given to patient's nurse.   Access used: AVF Access issues: none  Total UF removed: .6L Medication(s) given: none Post HD VS: 98.8,117/46,100%81,19 Post HD weight: 51.6kg   [facility]

## 2022-06-22 NOTE — Progress Notes (Signed)
Attempted Echocardiogram, patient is at Dialysis.

## 2022-06-22 NOTE — Progress Notes (Signed)
PHARMACY - PHYSICIAN COMMUNICATION CRITICAL VALUE ALERT - BLOOD CULTURE IDENTIFICATION (BCID)  Jeffrey Payne is an 89 y.o. male who presented to [facility] on 06/21/2022 with a chief complaint of shortness of breath.   Assessment:  89 year old male admitted with shortness of breath/subjective fevers. Patient had one set of blood cultures drawn in the ED that are now growing gram positive cocci in both bottles. Nothing was identified on the [place].   Name of physician (or Provider) Contacted: Elgergawy   Current antibiotics: Vanc/cefepime  Changes to prescribed antibiotics recommended:  Continue Vancomycin Discontinue Cefepime   Results for orders placed or performed during the hospital encounter of 06/21/22  Blood Culture ID Panel (Reflexed) (Collected: 06/21/2022 12:15 PM)  Result Value Ref Range   Enterococcus faecalis NOT DETECTED NOT DETECTED   Enterococcus Faecium NOT DETECTED NOT DETECTED   Listeria monocytogenes NOT DETECTED NOT DETECTED   Staphylococcus species NOT DETECTED NOT DETECTED   Staphylococcus aureus (BCID) NOT DETECTED NOT DETECTED   Staphylococcus epidermidis NOT DETECTED NOT DETECTED   Staphylococcus lugdunensis NOT DETECTED NOT DETECTED   Streptococcus species NOT DETECTED NOT DETECTED   Streptococcus agalactiae NOT DETECTED NOT DETECTED   Streptococcus pneumoniae NOT DETECTED NOT DETECTED   Streptococcus pyogenes NOT DETECTED NOT DETECTED   A.calcoaceticus-baumannii NOT DETECTED NOT DETECTED   Bacteroides fragilis NOT DETECTED NOT DETECTED   Enterobacterales NOT DETECTED NOT DETECTED   Enterobacter cloacae complex NOT DETECTED NOT DETECTED   Escherichia coli NOT DETECTED NOT DETECTED   Klebsiella aerogenes NOT DETECTED NOT DETECTED   Klebsiella oxytoca NOT DETECTED NOT DETECTED   Klebsiella pneumoniae NOT DETECTED NOT DETECTED   Proteus species NOT DETECTED NOT DETECTED   Salmonella species NOT DETECTED NOT DETECTED   Serratia marcescens NOT DETECTED  NOT DETECTED   Haemophilus influenzae NOT DETECTED NOT DETECTED   Neisseria meningitidis NOT DETECTED NOT DETECTED   Pseudomonas aeruginosa NOT DETECTED NOT DETECTED   Stenotrophomonas maltophilia NOT DETECTED NOT DETECTED   Candida albicans NOT DETECTED NOT DETECTED   Candida auris NOT DETECTED NOT DETECTED   Candida glabrata NOT DETECTED NOT DETECTED   Candida krusei NOT DETECTED NOT DETECTED   Candida parapsilosis NOT DETECTED NOT DETECTED   Candida tropicalis NOT DETECTED NOT DETECTED   Cryptococcus neoformans/gattii NOT DETECTED NOT DETECTED    Jimmy Footman, PharmD, BCPS, BCIDP Infectious Diseases Clinical Pharmacist Phone: [phone] 06/22/2022  9:08 AM

## 2022-06-22 NOTE — Significant Event (Signed)
Rapid Response Event Note   Reason for Call :  "Very anxious, right arm pain"  Initial Focused Assessment:  Pt moaning in bed A&O x3. Lungs diminished, Heart tones irregular. Skin warm and dry. Patient states "I need this pain to go away so I don't die." Initially c/o right arm pain though pain stopped after HD treatment stopped and pressure was no longer being held on site.   127/49 (73) HR 83 RR 23 O2 100% 2L [place]  Interventions:  EKG 0.'25mg'$  Xanax and Vicodin 1tablet PRN meds ordered, no longer needed at this time 12.75mg IV fentanyl x1 ordered, not given as pt states no longer in pain  Plan of Care:  Patient may return to 5W.  Call back for further needs  Event Summary:  MD Notified: DNoelle PennerMD Call Time: 1[place]Time: [phone] Time: [phone] RN

## 2022-06-22 NOTE — Consult Note (Signed)
ESRD Consult Note  Assessment/Recommendations:   ESRD: -outpatient orders: [place].  TTS.  F1 60.  Flow rates: 400/autoflow 1.5.  EDW 52.5 kg.  3 hours.  2K/2 calcium.  Right forearm aVF.  Meds: Calcitriol 0.5 mcg q. treatment -HD on TTS sched, HD today  AHRF -possible sequelae of recent bronchitis -no evidence of fluid on chest imaging  Sepsis -possible viral bronchitis -empiric abx per primary service  Volume/ hypertension:  -BP better, will UF as tolerated  Anemia of Chronic Kidney Disease:  -Hemoglobin 13.9, at goal for ESRD  Secondary Hyperparathyroidism/Hyperphosphatemia:  -PO4 slightly elevated, will resume his phoslo. Will resume calcitriol   # Additional recommendations: - Dose all meds for creatinine clearance < 10 ml/min  - Unless absolutely necessary, no MRIs with gadolinium.  - Implement save arm precautions.  Prefer needle sticks in the dorsum of the hands or wrists.  No blood pressure measurements in arm. - If blood transfusion is requested during hemodialysis sessions, please alert [place] prior to the session.  - If a hemodialysis catheter line culture is requested, please alert [place] as only hemodialysis nurses are able to collect those specimens.   Recommendations were discussed with the primary team.  Gean Quint, MD [facility]  History of Present Illness: Jeffrey Payne is a/an 89 y.o. male with a past medical history of ESRD combined CHF who presents with acute hypoxic respiratory failure secondary to possible acute viral bronchitis.  He was just discharged on 06/15/2022 for acute bronchitis, found to have metapneumovirus at that time, shortness of breath and improved during that admission.  He was also found to have confusion as per his wife.  He also did have relative hypotension and hypoxia on admission.  CTA was negative for PE, CXR clear.  COVID/influenza/RSV negative.  Does have pulmonary hypertension on CTA which is a known finding for him.   Did receive some fluids in the ER.  Patient seen and examined in the ER, currently on NRB satting 98% upon my evaluation.  Difficult to obtain history, keeps stating that he is due for dialysis today.   Medications:  Current Facility-Administered Medications  Medication Dose Route Frequency Provider Last Rate Last Admin   acetaminophen (TYLENOL) tablet 650 mg  650 mg Oral Q6H PRN Howerter, Justin B, DO       Or   acetaminophen (TYLENOL) suppository 650 mg  650 mg Rectal Q6H PRN Howerter, Justin B, DO       albuterol (PROVENTIL) (2.5 MG/3ML) 0.083% nebulizer solution 2.5 mg  2.5 mg Nebulization Q4H PRN Howerter, Justin B, DO       benzonatate (TESSALON) capsule 200 mg  200 mg Oral TID PRN Howerter, Justin B, DO       ceFEPIme (MAXIPIME) 1 g in sodium chloride 0.9 % 100 mL IVPB  1 g Intravenous Q24H Eliseo Gum P, RPH       ipratropium-albuterol (DUONEB) 0.5-2.5 (3) MG/3ML nebulizer solution 3 mL  3 mL Nebulization Q6H Howerter, Justin B, DO   3 mL at 06/22/22 0741   loratadine (CLARITIN) tablet 10 mg  10 mg Oral Daily Howerter, Justin B, DO   10 mg at 06/21/22 2033   melatonin tablet 3 mg  3 mg Oral QHS PRN Howerter, Justin B, DO       vancomycin variable dose per unstable renal function (pharmacist dosing)   Does not apply See admin instructions Elsie Amis, [facility]       Current Outpatient Medications  Medication Sig Dispense  Refill   acetaminophen (TYLENOL) 650 MG CR tablet Take 650 mg by mouth every 8 (eight) hours as needed for pain.     benzonatate (TESSALON) 100 MG capsule Take 1 capsule (100 mg total) by mouth 3 (three) times daily. (Patient not taking: Reported on 06/14/2022) 20 capsule 0   calcium acetate (PHOSLO) 667 MG tablet Take 667 mg by mouth 3 (three) times daily with meals. (Patient not taking: Reported on 06/14/2022)     carboxymethylcellulose (REFRESH PLUS) 0.5 % SOLN Place 1 drop into both eyes daily as needed (dry eyes). (Patient not taking: Reported on 06/14/2022)      cetirizine (ZYRTEC) 10 MG tablet Take 10 mg by mouth daily.     famotidine (PEPCID) 20 MG tablet Take 1 tablet (20 mg total) by mouth daily. (Patient not taking: Reported on 06/14/2022) 30 tablet 0   fluticasone (FLONASE) 50 MCG/ACT nasal spray Place 2 sprays into both nostrils daily as needed for allergies or rhinitis. (Patient not taking: Reported on 06/14/2022)     lactose free nutrition (BOOST) LIQD Take 237 mLs by mouth daily as needed (for nutrition). (Patient not taking: Reported on 06/14/2022)     metoprolol tartrate (LOPRESSOR) 25 MG tablet Take 0.5 tablets (12.5 mg total) by mouth 3 (three) times daily.     omeprazole (PRILOSEC OTC) 20 MG tablet Take 20 mg by mouth daily.     tamsulosin (FLOMAX) 0.4 MG CAPS capsule Take 1 capsule (0.4 mg total) by mouth daily after supper. 30 capsule 0     ALLERGIES Patient has no known allergies.  MEDICAL HISTORY Past Medical History:  Diagnosis Date   Anemia    Arthritis    knees   Cancer (HCC)    prostate   ED (erectile dysfunction)    GERD (gastroesophageal reflux disease)    ocassional    Hard of hearing    Hypertension    Hypertriglyceridemia    Renal failure    Dialysis dependent     SOCIAL HISTORY Social History   Socioeconomic History   Marital status: Married    Spouse name: Not on file   Number of children: Not on file   Years of education: Not on file   Highest education level: Not on file  Occupational History   Not on file  Tobacco Use   Smoking status: Never   Smokeless tobacco: Never  Vaping Use   Vaping Use: Never used  Substance and Sexual Activity   Alcohol use: Yes    Alcohol/week: 14.0 standard drinks of alcohol    Types: 14 Standard drinks or equivalent per week    Comment: 2 cocktails per day   Drug use: No   Sexual activity: Not on file  Other Topics Concern   Not on file  Social History Narrative   Not on file   Social Determinants of Health   Financial Resource Strain: Not on file  Food  Insecurity: No Food Insecurity (06/15/2022)   Hunger Vital Sign    Worried About Running Out of Food in the Last Year: Never true    Ran Out of Food in the Last Year: Never true  Transportation Needs: No Transportation Needs (06/15/2022)   PRAPARE - Hydrologist (Medical): No    Lack of Transportation (Non-Medical): No  Physical Activity: Not on file  Stress: Not on file  Social Connections: Not on file  Intimate Partner Violence: Not At Risk (06/15/2022)   Humiliation, Afraid, Rape, and  Kick questionnaire    Fear of Current or Ex-Partner: No    Emotionally Abused: No    Physically Abused: No    Sexually Abused: No     FAMILY HISTORY Family History  Problem Relation Age of Onset   Cancer Mother      Review of Systems: 12 systems were reviewed and negative except per HPI  Physical Exam: Vitals:   06/22/22 0615 06/22/22 0745  BP: 126/68 112/65  Pulse:  85  Resp:  (!) 21  Temp:    SpO2:  98%   No intake/output data recorded.  Intake/Output Summary (Last 24 hours) at 06/22/2022 [id number] Last data filed at 06/21/2022 1734 Gross per 24 hour  Intake 505.28 ml  Output --  Net 505.28 ml   General: in slight resp distress, laying flat in stretcher HEENT: anicteric sclera, dry MM CV: normal rate, no murmurs Lungs: cta b/l Abd: soft, non-tender, non-distended Ext: no edema Neuro: following commands, awake, mild confusion Dialysis access: rt forearm avf +b/t  Test Results Reviewed Lab Results  Component Value Date   NA 133 (L) 06/22/2022   K 5.6 (H) 06/22/2022   CL 92 (L) 06/22/2022   CO2 22 06/22/2022   BUN 39 (H) 06/22/2022   CREATININE 6.16 (H) 06/22/2022   CALCIUM 8.5 (L) 06/22/2022   ALBUMIN 2.6 (L) 06/22/2022   PHOS 5.2 (H) 06/22/2022    I have reviewed relevant outside healthcare records

## 2022-06-23 ENCOUNTER — Inpatient Hospital Stay (HOSPITAL_COMMUNITY): Payer: Medicare Other

## 2022-06-23 DIAGNOSIS — N186 End stage renal disease: Secondary | ICD-10-CM | POA: Diagnosis not present

## 2022-06-23 DIAGNOSIS — A419 Sepsis, unspecified organism: Secondary | ICD-10-CM | POA: Diagnosis not present

## 2022-06-23 DIAGNOSIS — J9601 Acute respiratory failure with hypoxia: Secondary | ICD-10-CM | POA: Diagnosis not present

## 2022-06-23 DIAGNOSIS — R7989 Other specified abnormal findings of blood chemistry: Secondary | ICD-10-CM | POA: Diagnosis not present

## 2022-06-23 DIAGNOSIS — I10 Essential (primary) hypertension: Secondary | ICD-10-CM | POA: Diagnosis not present

## 2022-06-23 LAB — ECHOCARDIOGRAM COMPLETE
AR max vel: 2.54 cm2
AV Area VTI: 2.73 cm2
AV Area mean vel: 2.31 cm2
AV Mean grad: 3.5 mmHg
AV Peak grad: 5.5 mmHg
Ao pk vel: 1.18 m/s
Height: 67 in
S' Lateral: 4.1 cm
Weight: 2544 oz

## 2022-06-23 LAB — RENAL FUNCTION PANEL
Albumin: 2.3 g/dL — ABNORMAL LOW (ref 3.5–5.0)
Anion gap: 15 (ref 5–15)
BUN: 42 mg/dL — ABNORMAL HIGH (ref 8–23)
CO2: 25 mmol/L (ref 22–32)
Calcium: 8.8 mg/dL — ABNORMAL LOW (ref 8.9–10.3)
Chloride: 96 mmol/L — ABNORMAL LOW (ref 98–111)
Creatinine, Ser: 5.69 mg/dL — ABNORMAL HIGH (ref 0.61–1.24)
GFR, Estimated: 9 mL/min — ABNORMAL LOW (ref >60)
Glucose, Bld: 82 mg/dL (ref 70–99)
Phosphorus: 6.1 mg/dL — ABNORMAL HIGH (ref 2.5–4.6)
Potassium: 4.2 mmol/L (ref 3.5–5.1)
Sodium: 136 mmol/L (ref 135–145)

## 2022-06-23 LAB — CBC
HCT: 37.4 % — ABNORMAL LOW (ref 39.0–52.0)
Hemoglobin: 12.5 g/dL — ABNORMAL LOW (ref 13.0–17.0)
MCH: 33 pg (ref 26.0–34.0)
MCHC: 33.4 g/dL (ref 30.0–36.0)
MCV: 98.7 fL (ref 80.0–100.0)
Platelets: 197 10*3/uL (ref 150–400)
RBC: 3.79 MIL/uL — ABNORMAL LOW (ref 4.22–5.81)
RDW: 15.9 % — ABNORMAL HIGH (ref 11.5–15.5)
WBC: 19.5 10*3/uL — ABNORMAL HIGH (ref 4.0–10.5)
nRBC: 0 % (ref 0.0–0.2)

## 2022-06-23 LAB — CBC WITH DIFFERENTIAL/PLATELET
Abs Immature Granulocytes: 0.09 10*3/uL — ABNORMAL HIGH (ref 0.00–0.07)
Basophils Absolute: 0.1 10*3/uL (ref 0.0–0.1)
Basophils Relative: 0 %
Eosinophils Absolute: 0 10*3/uL (ref 0.0–0.5)
Eosinophils Relative: 0 %
HCT: 38.5 % — ABNORMAL LOW (ref 39.0–52.0)
Hemoglobin: 12.8 g/dL — ABNORMAL LOW (ref 13.0–17.0)
Immature Granulocytes: 1 %
Lymphocytes Relative: 6 %
Lymphs Abs: 1 10*3/uL (ref 0.7–4.0)
MCH: 33.2 pg (ref 26.0–34.0)
MCHC: 33.2 g/dL (ref 30.0–36.0)
MCV: 99.7 fL (ref 80.0–100.0)
Monocytes Absolute: 1.6 10*3/uL — ABNORMAL HIGH (ref 0.1–1.0)
Monocytes Relative: 9 %
Neutro Abs: 14.7 10*3/uL — ABNORMAL HIGH (ref 1.7–7.7)
Neutrophils Relative %: 84 %
Platelets: 193 10*3/uL (ref 150–400)
RBC: 3.86 MIL/uL — ABNORMAL LOW (ref 4.22–5.81)
RDW: 15.7 % — ABNORMAL HIGH (ref 11.5–15.5)
WBC: 17.5 10*3/uL — ABNORMAL HIGH (ref 4.0–10.5)
nRBC: 0 % (ref 0.0–0.2)

## 2022-06-23 LAB — HEPATITIS B SURFACE ANTIBODY, QUANTITATIVE: Hep B S AB Quant (Post): <3.1 m[IU]/mL — ABNORMAL LOW (ref >9.9)

## 2022-06-23 MED ORDER — CALCITRIOL 0.25 MCG PO CAPS
0.5000 ug | ORAL_CAPSULE | ORAL | Status: DC
  Administered 2022-06-24 – 2022-06-29 (×3): 0.5 ug via ORAL
  Filled 2022-06-23 (×4): qty 2

## 2022-06-23 MED ORDER — CALCIUM ACETATE (PHOS BINDER) 667 MG PO CAPS
667.0000 mg | ORAL_CAPSULE | Freq: Three times a day (TID) | ORAL | Status: DC
  Administered 2022-06-23 – 2022-06-29 (×16): 667 mg via ORAL
  Filled 2022-06-23 (×15): qty 1

## 2022-06-23 MED ORDER — VANCOMYCIN HCL 750 MG/150ML IV SOLN
750.0000 mg | Freq: Once | INTRAVENOUS | Status: DC
  Filled 2022-06-23: qty 150

## 2022-06-23 MED ORDER — NEPRO/CARBSTEADY PO LIQD
237.0000 mL | Freq: Three times a day (TID) | ORAL | Status: DC
  Administered 2022-06-23 – 2022-06-28 (×15): 237 mL via ORAL

## 2022-06-23 MED ORDER — TAMSULOSIN HCL 0.4 MG PO CAPS
0.4000 mg | ORAL_CAPSULE | Freq: Every day | ORAL | Status: DC
  Administered 2022-06-23 – 2022-06-29 (×7): 0.4 mg via ORAL
  Filled 2022-06-23 (×7): qty 1

## 2022-06-23 NOTE — Progress Notes (Signed)
Mobility Specialist Progress Note   06/23/22 1257  Mobility  Activity Ambulated with assistance to bathroom  Level of Assistance Minimal assist, patient does 75% or more  Assistive Device Front wheel walker  Distance Ambulated (ft) 22 ft  Activity Response Tolerated well  Mobility Referral Yes  $Mobility charge 1 Mobility   Pre Mobility: HR, 124/59 BP, 96% SpO2 on RA  During Mobility: 99 HR, BP, 91% SpO2 on RA  Post Mobility: 94 HR, 127/59 BP, 95% SpO2 on RA   Received pt in bed having an urgency for a BM. With increased time and MinA pt able to get EOB, c/o slight dizziness but BP stable and recovered quickly w/ PLB. MinG + verbal directional cues needed for transfer to BR but no LOB or faults. Successful BM. MinA for pericare while pt tolerates ~58mns of standing, then returned back to bed. Pt left feeling better and requesting more mobility sessions, Call placed in reach and bed alarm on.     JHolland FallingMobility Specialist Please contact via SecureChat or  Rehab office at [phone]

## 2022-06-23 NOTE — Progress Notes (Signed)
[facility] Progress Note    Assessment/ Plan:   ESRD: -outpatient orders: [place].  TTS.  F160.  Flow rates: 400/autoflow 1.5.  EDW 52.5 kg.  3 hours.  2K/2 calcium.  Right forearm aVF.  Meds: Calcitriol 0.5 mcg q. treatment -HD on TTS sched   AHRF -possible sequelae of recent bronchitis -no evidence of fluid on chest imaging   Sepsis, gram positive bacteremia -2/26 bcx pos for gram pos cocci-awaiting speciation -vanc per primary service   Volume/ hypertension:  -will UF as tolerated -on midodrine for soft BPs   Anemia of Chronic Kidney Disease:  -Hemoglobin 12.5, at goal for ESRD   Secondary Hyperparathyroidism/Hyperphosphatemia:  -PO4 slightly elevated, will resume his phoslo. Will resume calcitriol     Subjective:   Patient seen and examined bedside. Jeffrey Payne at bedside. Had issues with painful cannulation yesterday, they feel as if it were due to being stuck a lot. Rapid was called at that time given pain and anxiety. He reports that he 'felt like he was going to die'. Received ativan and fentanyl   Objective:   BP (!) 112/44 (BP Location: Left Arm)   Pulse 78   Temp 98.4 F (36.9 C) (Axillary)   Resp 18   Ht '5\' 7"'$  (1.702 m)   Wt 72.1 kg   SpO2 92%   BMI 24.90 kg/m   Intake/Output Summary (Last 24 hours) at 06/23/2022 1147 Last data filed at 06/22/2022 1820 Gross per 24 hour  Intake --  Output 600 ml  Net -600 ml   Weight change:   Physical Exam: Gen: NAD CVS: RRR Resp: cta b/l Abd: soft Ext: no edema Neuro: awake, alert Dialysis access: right forearm AVF +b/t  Imaging: ECHOCARDIOGRAM COMPLETE  Result Date: 06/23/2022    ECHOCARDIOGRAM REPORT   Patient Name:   Jeffrey Payne Date of Exam: 06/23/2022 Medical Rec #:  [id number]      Height:       67.0 in Accession #:    [id number]     Weight:       159.0 lb Date of Birth:  05/13/1932      BSA:          1.834 m Patient Age:    89 years       BP:           102/42 mmHg Patient Gender: M               HR:           73 bpm. Exam Location:  Inpatient Procedure: 2D Echo, Cardiac Doppler and Color Doppler Indications:    Elevated Troponin  History:        Patient has prior history of Echocardiogram examinations, most                 recent 02/01/2021. CHF, Arrythmias:LBBB and Bradycardia,                 Signs/Symptoms:Hypotension and Shortness of Breath; Risk                 Factors:Hypertension and Dyslipidemia.  Sonographer:    Ronny Flurry Referring Phys: [id number] [place]  1. Left ventricular ejection fraction, by estimation, is 30 to 35%. The left ventricle has moderately decreased function. The left ventricle demonstrates global hypokinesis. There is mild concentric left ventricular hypertrophy. Left ventricular diastolic parameters are indeterminate.  2. Right ventricular systolic function is moderately reduced. The right ventricular size is normal.  3. The mitral valve is normal in structure. No evidence of mitral valve regurgitation. No evidence of mitral stenosis.  4. The aortic valve is tricuspid. There is mild calcification of the aortic valve. Aortic valve regurgitation is mild. Aortic valve sclerosis/calcification is present, without any evidence of aortic stenosis.  5. The inferior vena cava is normal in size with greater than 50% respiratory variability, suggesting right atrial pressure of 3 mmHg. FINDINGS  Left Ventricle: Left ventricular ejection fraction, by estimation, is 30 to 35%. The left ventricle has moderately decreased function. The left ventricle demonstrates global hypokinesis. The left ventricular internal cavity size was normal in size. There is mild concentric left ventricular hypertrophy. Abnormal (paradoxical) septal motion, consistent with left bundle branch block. Left ventricular diastolic parameters are indeterminate. Right Ventricle: The right ventricular size is normal. No increase in right ventricular wall thickness. Right ventricular systolic  function is moderately reduced. Left Atrium: Left atrial size was normal in size. Right Atrium: Right atrial size was normal in size. Pericardium: There is no evidence of pericardial effusion. Mitral Valve: The mitral valve is normal in structure. Mild mitral annular calcification. No evidence of mitral valve regurgitation. No evidence of mitral valve stenosis. Tricuspid Valve: The tricuspid valve is normal in structure. Tricuspid valve regurgitation is not demonstrated. No evidence of tricuspid stenosis. Aortic Valve: The aortic valve is tricuspid. There is mild calcification of the aortic valve. Aortic valve regurgitation is mild. Aortic valve sclerosis/calcification is present, without any evidence of aortic stenosis. Aortic valve mean gradient measures 3.5 mmHg. Aortic valve peak gradient measures 5.5 mmHg. Aortic valve area, by VTI measures 2.73 cm. Pulmonic Valve: The pulmonic valve was normal in structure. Pulmonic valve regurgitation is not visualized. No evidence of pulmonic stenosis. Aorta: The aortic root is normal in size and structure. Venous: The inferior vena cava is normal in size with greater than 50% respiratory variability, suggesting right atrial pressure of 3 mmHg. IAS/Shunts: No atrial level shunt detected by color flow Doppler.  LEFT VENTRICLE PLAX 2D LVIDd:         5.00 cm LVIDs:         4.10 cm LV PW:         1.10 cm LV IVS:        1.10 cm LVOT diam:     2.00 cm LV SV:         55 LV SV Index:   30 LVOT Area:     3.14 cm  RIGHT VENTRICLE         IVC TAPSE (M-mode): 1.7 cm  IVC diam: 1.50 cm LEFT ATRIUM             Index        RIGHT ATRIUM           Index LA diam:        3.70 cm 2.02 cm/m   RA Area:     13.40 cm LA Vol (A2C):   18.9 ml 10.30 ml/m  RA Volume:   30.00 ml  16.35 ml/m LA Vol (A4C):   39.2 ml 21.34 ml/m LA Biplane Vol: 26.5 ml 14.45 ml/m  AORTIC VALVE AV Area (Vmax):    2.54 cm AV Area (Vmean):   2.31 cm AV Area (VTI):     2.73 cm AV Vmax:           117.50 cm/s AV  Vmean:          81.750 cm/s AV VTI:  0.202 m AV Peak Grad:      5.5 mmHg AV Mean Grad:      3.5 mmHg LVOT Vmax:         94.90 cm/s LVOT Vmean:        60.000 cm/s LVOT VTI:          0.175 m LVOT/AV VTI ratio: 0.87  AORTA Ao Root diam: 3.50 cm Ao Asc diam:  3.60 cm  SHUNTS Systemic VTI:  0.18 m Systemic Diam: 2.00 cm Glori Bickers MD Electronically signed by Glori Bickers MD Signature Date/Time: 06/23/2022/11:28:21 AM    Final    CT Angio Chest PE W and/or Wo Contrast  Result Date: 06/21/2022 CLINICAL DATA:  Dyspnea, hypoxia EXAM: CT ANGIOGRAPHY CHEST WITH CONTRAST TECHNIQUE: Multidetector CT imaging of the chest was performed using the standard protocol during bolus administration of intravenous contrast. Multiplanar CT image reconstructions and MIPs were obtained to evaluate the vascular anatomy. RADIATION DOSE REDUCTION: This exam was performed according to the departmental dose-optimization program which includes automated exposure control, adjustment of the mA and/or kV according to patient size and/or use of iterative reconstruction technique. CONTRAST:  26m OMNIPAQUE IOHEXOL 350 MG/ML SOLN COMPARISON:  02/02/2022 FINDINGS: Cardiovascular: There is adequate opacification of the pulmonary arterial tree. No intraluminal filling defect identified to suggest acute pulmonary embolism. The central pulmonary arteries are enlarged in keeping with changes of pulmonary arterial hypertension. Global cardiac size is mildly enlarged. Small pericardial effusion. Mild coronary artery calcification. Extensive atherosclerotic calcification within the thoracic aorta. No aortic aneurysm. Mediastinum/Nodes: No enlarged mediastinal, hilar, or axillary lymph nodes. Thyroid gland, trachea, and esophagus demonstrate no significant findings. Lungs/Pleura: Mild right basilar dependent atelectasis. Lungs are otherwise clear save for a benign calcified granuloma within the left upper lobe. No pneumothorax or pleural  effusion. Central airways are widely patent. Upper Abdomen: No acute abnormality. Musculoskeletal: No acute bone abnormality. No lytic or blastic bone lesion. Review of the MIP images confirms the above findings. IMPRESSION: 1. No pulmonary embolism. No acute intrathoracic pathology identified. 2. Mild cardiomegaly. Mild coronary artery calcification. 3. Morphologic changes in keeping with pulmonary arterial hypertension. Aortic Atherosclerosis (ICD10-I70.0). Electronically Signed   By: AFidela SalisburyM.D.   On: 06/21/2022 18:59   CT Head Wo Contrast  Result Date: 06/21/2022 CLINICAL DATA:  Altered level of consciousness, short of breath EXAM: CT HEAD WITHOUT CONTRAST TECHNIQUE: Contiguous axial images were obtained from the base of the skull through the vertex without intravenous contrast. RADIATION DOSE REDUCTION: This exam was performed according to the departmental dose-optimization program which includes automated exposure control, adjustment of the mA and/or kV according to patient size and/or use of iterative reconstruction technique. COMPARISON:  01/29/2021 FINDINGS: Brain: No evidence of acute infarct or hemorrhage. The lateral ventricles and midline structures are stable. Stable coarse calcifications within the central pons. Diffuse cerebral atrophy, with continued prominence of the extra-axial CSF spaces. No acute extra-axial fluid collection. No mass effect. Vascular: Stable atherosclerosis.  No hyperdense vessel. Skull: Normal. Negative for fracture or focal lesion. Sinuses/Orbits: Small bilateral mastoid effusions are unchanged. Remaining paranasal sinuses are clear. Other: None. IMPRESSION: 1. Stable head CT, no acute intracranial process. Electronically Signed   By: MRanda NgoM.D.   On: 06/21/2022 18:41   DG Chest Port 1 View  Result Date: 06/21/2022 CLINICAL DATA:  Sepsis. EXAM: PORTABLE CHEST 1 VIEW COMPARISON:  June 14, 2022. FINDINGS: Stable cardiomediastinal silhouette. Both  lungs are clear. The visualized skeletal structures are unremarkable. IMPRESSION: No active  disease. Aortic Atherosclerosis (ICD10-I70.0). Electronically Signed   By: Marijo Conception M.D.   On: 06/21/2022 12:39    Labs: BMET Recent Labs  Lab 06/21/22 1215 06/22/22 0340 06/22/22 0351 06/23/22 0254  NA 141 137 133* 136  K 3.6 4.3 5.6* 4.2  CL 96* 92*  --  96*  CO2 24 22  --  25  GLUCOSE 74 86  --  82  BUN 28* 39*  --  42*  CREATININE 5.60* 6.16*  --  5.69*  CALCIUM 9.1 8.5*  --  8.8*  PHOS  --  5.2*  --  6.1*   CBC Recent Labs  Lab 06/21/22 1215 06/22/22 0340 06/22/22 0351 06/23/22 0254  WBC 24.4* 33.2*  --  19.5*  NEUTROABS 22.7* 31.2*  --   --   HGB 14.7 13.0 13.9 12.5*  HCT 44.5 40.0 41.0 37.4*  MCV 102.5* 102.6*  --  98.7  PLT 244 227  --  197    Medications:     Chlorhexidine Gluconate Cloth  6 each Topical Q0600   feeding supplement (NEPRO CARB STEADY)  237 mL Oral TID BM   loratadine  10 mg Oral Daily   midodrine  10 mg Oral BID WC      Gean Quint, MD [facility] 06/23/2022, 11:47 AM

## 2022-06-23 NOTE — Progress Notes (Signed)
Echocardiogram 2D Echocardiogram has been performed.  Jeffrey Payne 06/23/2022, 11:02 AM

## 2022-06-23 NOTE — Progress Notes (Signed)
Pt receives out-pt HD at [place] on TTS. Pt arrives at 11:35 for 11:55 chair time. Will assist as needed.   Melven Sartorius Renal Navigator [phone]

## 2022-06-23 NOTE — Plan of Care (Signed)

## 2022-06-23 NOTE — Progress Notes (Addendum)
PROGRESS NOTE        PATIENT DETAILS Name: Jeffrey Payne Age: 89 y.o. Sex: male Date of Birth: 07/14/1932 Admit Date: 06/21/2022 Admitting Physician Rhetta Mura, DO MF:[id number], Christoper Fabian, MD  Brief Summary: Patient is a 89 y.o.  male w hx of chronic HFpEF/HFrEF, ESRD on HD TTS who presented to the hospital with acute hypoxic respiratory failure in the setting of acute bronchitis due to Swartz virus.  Significant events: 2/19-2/20>> hospitalization for hypoxia from decompensated heart failure/Metapneumonia virus. 2/26>> admit to [facility]  Significant studies: 2/26>> CTA chest: No PE/no obvious PNA 2/26>> CT head: No acute intracranial process.  Significant microbiology data: 2/19>> respiratory virus panel:+ve Metapneumonia virus 2/26>> COVID/influenza/RSV PCR: Negative 2/26>> blood culture: Gram-positive cocci  Procedures: None  Consults: Renal  Subjective: Lying comfortably in bed-denies any chest pain or shortness of breath.  Claims he had significant right arm pain with HD yesterday-appears somewhat confused this morning and is unable to elaborate further.  Objective: Vitals: Blood pressure (!) 112/44, pulse 78, temperature 98.4 F (36.9 C), temperature source Axillary, resp. rate 18, height '5\' 7"'$  (1.702 m), weight 72.1 kg, SpO2 92 %.   Exam: Gen Exam:not in any distress HEENT:atraumatic, normocephalic Chest: B/L clear to auscultation anteriorly CVS:S1S2 regular Abdomen:soft non tender, non distended Extremities:no edema Neurology: Non focal Skin: no rash  Pertinent Labs/Radiology:    Latest Ref Rng & Units 06/23/2022    2:54 AM 06/22/2022    3:51 AM 06/22/2022    3:40 AM  CBC  WBC 4.0 - 10.5 K/uL 19.5   33.2   Hemoglobin 13.0 - 17.0 g/dL 12.5  13.9  13.0   Hematocrit 39.0 - 52.0 % 37.4  41.0  40.0   Platelets 150 - 400 K/uL 197   227     Lab Results  Component Value Date   NA 136 06/23/2022   K 4.2 06/23/2022   CL 96  (L) 06/23/2022   CO2 25 06/23/2022      Assessment/Plan: Severe sepsis  Acute hypoxic respiratory failure due to bronchitis in the setting of recent Meta pneumonia virus Appears overall stable On 2-3 L of oxygen Continue supportive care Continue bronchodilators Mobilize as much as possible Still with significant leukocytosis but afebrile Awaiting final culture results-see below  Gram-positive bacteremia Awaiting final ID Not sure if this is a contaminant Continue empiric antibiotics  ?  Delirium Somewhat confused this morning but seems to be somewhat better now that family is at bedside. Supportive care Delirium precautions  ESRD on HD TTS Nephrology following  Chronic HFrEF Volume status stable Volume removal with HD Awaiting updated echo  HTN BP soft but stable Continue to hold metoprolol-remains on midodrine as well.   BMI: Estimated body mass index is 24.9 kg/m as calculated from the following:   Height as of this encounter: '5\' 7"'$  (1.702 m).   Weight as of this encounter: 72.1 kg.   Code status:   Code Status: Full Code   DVT Prophylaxis: SCDs Start: 06/21/22 1945   Family Communication: Spouse at bedside   Disposition Plan: Status is: Inpatient Remains inpatient appropriate because: Severity of illness   Planned Discharge Destination:Home health    Diet: Diet Order             Diet renal with fluid restriction Room service appropriate? No; Fluid consistency: Thin  Diet effective  now                     Antimicrobial agents: Anti-infectives (From admission, onward)    Start     Dose/Rate Route Frequency Ordered Stop   06/22/22 2200  ceFEPIme (MAXIPIME) 1 g in sodium chloride 0.9 % 100 mL IVPB  Status:  Discontinued        1 g 200 mL/hr over 30 Minutes Intravenous Every 24 hours 06/21/22 1656 06/22/22 0942   06/22/22 1800  vancomycin (VANCOREADY) IVPB 750 mg/150 mL        750 mg 150 mL/hr over 60 Minutes Intravenous Every T-Th-Sa  (1800) 06/22/22 1003     06/21/22 1445  ceFEPIme (MAXIPIME) 1 g in sodium chloride 0.9 % 100 mL IVPB        1 g 200 mL/hr over 30 Minutes Intravenous  Once 06/21/22 1432 06/21/22 1524   06/21/22 1445  vancomycin (VANCOREADY) IVPB 1500 mg/300 mL        1,500 mg 150 mL/hr over 120 Minutes Intravenous  Once 06/21/22 1432 06/21/22 1734   06/21/22 1435  vancomycin variable dose per unstable renal function (pharmacist dosing)  Status:  Discontinued         Does not apply See admin instructions 06/21/22 1435 06/22/22 1003   06/21/22 1430  ceFEPIme (MAXIPIME) 1 g in sodium chloride 0.9 % 100 mL IVPB  Status:  Discontinued        1 g 200 mL/hr over 30 Minutes Intravenous  Once 06/21/22 1416 06/21/22 1432   06/21/22 1430  metroNIDAZOLE (FLAGYL) IVPB 500 mg        500 mg 100 mL/hr over 60 Minutes Intravenous  Once 06/21/22 1416 06/21/22 1636   06/21/22 1430  vancomycin (VANCOCIN) IVPB 1000 mg/200 mL premix  Status:  Discontinued        1,000 mg 200 mL/hr over 60 Minutes Intravenous  Once 06/21/22 1416 06/21/22 1432        MEDICATIONS: Scheduled Meds:  Chlorhexidine Gluconate Cloth  6 each Topical Q0600   fentaNYL (SUBLIMAZE) injection  12.5 mcg Intravenous Once   loratadine  10 mg Oral Daily   midodrine  10 mg Oral BID WC   Continuous Infusions:  vancomycin     PRN Meds:.acetaminophen **OR** acetaminophen, albuterol, ALPRAZolam, benzonatate, HYDROcodone-acetaminophen, ipratropium-albuterol, melatonin   I have personally reviewed following labs and imaging studies  LABORATORY DATA: CBC: Recent Labs  Lab 06/21/22 1215 06/22/22 0340 06/22/22 0351 06/23/22 0254  WBC 24.4* 33.2*  --  19.5*  NEUTROABS 22.7* 31.2*  --   --   HGB 14.7 13.0 13.9 12.5*  HCT 44.5 40.0 41.0 37.4*  MCV 102.5* 102.6*  --  98.7  PLT 244 227  --  [id number]    Basic Metabolic Panel: Recent Labs  Lab 06/21/22 1215 06/21/22 2245 06/22/22 0340 06/22/22 0351 06/23/22 0254  NA 141  --  137 133* 136  K 3.6  --   4.3 5.6* 4.2  CL 96*  --  92*  --  96*  CO2 24  --  22  --  25  GLUCOSE 74  --  86  --  82  BUN 28*  --  39*  --  42*  CREATININE 5.60*  --  6.16*  --  5.69*  CALCIUM 9.1  --  8.5*  --  8.8*  MG  --  1.9 1.7  --   --   PHOS  --   --  5.2*  --  6.1*    GFR: Estimated Creatinine Clearance: 8.6 mL/min (A) (by C-G formula based on SCr of 5.69 mg/dL (H)).  Liver Function Tests: Recent Labs  Lab 06/21/22 1215 06/22/22 0340 06/23/22 0254  AST 16 15  --   ALT 9 10  --   ALKPHOS 112 102  --   BILITOT 1.9* 1.8*  --   PROT 6.6 6.2*  --   ALBUMIN 3.0* 2.6* 2.3*   No results for input(s): "LIPASE", "AMYLASE" in the last 168 hours. Recent Labs  Lab 06/22/22 0340  AMMONIA 33    Coagulation Profile: Recent Labs  Lab 06/22/22 0340  INR 1.2    Cardiac Enzymes: No results for input(s): "CKTOTAL", "CKMB", "CKMBINDEX", "TROPONINI" in the last 168 hours.  BNP (last 3 results) No results for input(s): "PROBNP" in the last 8760 hours.  Lipid Profile: No results for input(s): "CHOL", "HDL", "LDLCALC", "TRIG", "CHOLHDL", "LDLDIRECT" in the last 72 hours.  Thyroid Function Tests: Recent Labs    06/21/22 2245  TSH 1.624    Anemia Panel: Recent Labs    06/21/22 2245  VITAMINB12 813    Urine analysis:    Component Value Date/Time   COLORURINE YELLOW 04/06/2022 [place] 04/06/2022 1554   LABSPEC 1.010 04/06/2022 1554   PHURINE 7.5 04/06/2022 1554   GLUCOSEU NEGATIVE 04/06/2022 1554   HGBUR TRACE (A) 04/06/2022 1554   BILIRUBINUR NEGATIVE 04/06/2022 1554   KETONESUR NEGATIVE 04/06/2022 1554   PROTEINUR 100 (A) 04/06/2022 1554   NITRITE NEGATIVE 04/06/2022 1554   LEUKOCYTESUR NEGATIVE 04/06/2022 1554    Sepsis Labs: Lactic Acid, Venous    Component Value Date/Time   LATICACIDVEN 1.1 06/21/2022 1508    MICROBIOLOGY: Recent Results (from the past 240 hour(s))  Resp panel by RT-PCR (RSV, Flu A&B, Covid) Anterior Nasal Swab     Status: None    Collection Time: 06/14/22 12:11 PM   Specimen: Anterior Nasal Swab  Result Value Ref Range Status   SARS Coronavirus 2 by RT PCR NEGATIVE NEGATIVE Final   Influenza A by PCR NEGATIVE NEGATIVE Final   Influenza B by PCR NEGATIVE NEGATIVE Final    Comment: (NOTE) The Xpert Xpress SARS-CoV-2/FLU/RSV plus assay is intended as an aid in the diagnosis of influenza from Nasopharyngeal swab specimens and should not be used as a sole basis for treatment. Nasal washings and aspirates are unacceptable for Xpert Xpress SARS-CoV-2/FLU/RSV testing.  Fact Sheet for Patients: EntrepreneurPulse.com.au  Fact Sheet for Healthcare Providers: IncredibleEmployment.be  This test is not yet approved or cleared by the [place] FDA and has been authorized for detection and/or diagnosis of SARS-CoV-2 by FDA under an Emergency Use Authorization (EUA). This EUA will remain in effect (meaning this test can be used) for the duration of the COVID-19 declaration under Section 564(b)(1) of the Act, 21 U.S.C. section 360bbb-3(b)(1), unless the authorization is terminated or revoked.     Resp Syncytial Virus by PCR NEGATIVE NEGATIVE Final    Comment: (NOTE) Fact Sheet for Patients: EntrepreneurPulse.com.au  Fact Sheet for Healthcare Providers: IncredibleEmployment.be  This test is not yet approved or cleared by the [place] FDA and has been authorized for detection and/or diagnosis of SARS-CoV-2 by FDA under an Emergency Use Authorization (EUA). This EUA will remain in effect (meaning this test can be used) for the duration of the COVID-19 declaration under Section 564(b)(1) of the Act, 21 U.S.C. section 360bbb-3(b)(1), unless the authorization is terminated or revoked.  Performed at [facility],  [address] [place], [place] [address]   Respiratory (~20 pathogens) panel by PCR     Status: Abnormal   Collection  Time: 06/14/22  2:32 PM   Specimen: Nasopharyngeal Swab; Respiratory  Result Value Ref Range Status   Adenovirus NOT DETECTED NOT DETECTED Final   Coronavirus 229E NOT DETECTED NOT DETECTED Final    Comment: (NOTE) The Coronavirus on the Respiratory Panel, DOES NOT test for the novel  Coronavirus (2019 nCoV)    Coronavirus HKU1 NOT DETECTED NOT DETECTED Final   Coronavirus NL63 NOT DETECTED NOT DETECTED Final   Coronavirus OC43 NOT DETECTED NOT DETECTED Final   Metapneumovirus DETECTED (A) NOT DETECTED Final   Rhinovirus / Enterovirus NOT DETECTED NOT DETECTED Final   Influenza A NOT DETECTED NOT DETECTED Final   Influenza B NOT DETECTED NOT DETECTED Final   Parainfluenza Virus 1 NOT DETECTED NOT DETECTED Final   Parainfluenza Virus 2 NOT DETECTED NOT DETECTED Final   Parainfluenza Virus 3 NOT DETECTED NOT DETECTED Final   Parainfluenza Virus 4 NOT DETECTED NOT DETECTED Final   Respiratory Syncytial Virus NOT DETECTED NOT DETECTED Final   Bordetella pertussis NOT DETECTED NOT DETECTED Final   Bordetella Parapertussis NOT DETECTED NOT DETECTED Final   Chlamydophila pneumoniae NOT DETECTED NOT DETECTED Final   Mycoplasma pneumoniae NOT DETECTED NOT DETECTED Final    Comment: Performed at [facility], [address] [place], [place] [address]  Blood Culture (routine x 2)     Status: None (Preliminary result)   Collection Time: 06/21/22 12:15 PM   Specimen: BLOOD  Result Value Ref Range Status   Specimen Description BLOOD LEFT ANTECUBITAL  Final   Special Requests   Final    BOTTLES DRAWN AEROBIC AND ANAEROBIC Blood Culture adequate volume   Culture  Setup Time   Final    GRAM POSITIVE COCCI IN BOTH AEROBIC AND ANAEROBIC BOTTLES Organism ID to follow CRITICAL RESULT CALLED TO, READ BACK BY AND VERIFIED WITHEzekiel Slocumb PHARMD, AT [id number] 06/22/22 [place] Performed at [facility], [place] [address], [place],  [address]    Culture GRAM POSITIVE COCCI  Final    Report Status PENDING  Incomplete  Resp panel by RT-PCR (RSV, Flu A&B, Covid) Anterior Nasal Swab     Status: None   Collection Time: 06/21/22 12:15 PM   Specimen: Anterior Nasal Swab  Result Value Ref Range Status   SARS Coronavirus 2 by RT PCR NEGATIVE NEGATIVE Final   Influenza A by PCR NEGATIVE NEGATIVE Final   Influenza B by PCR NEGATIVE NEGATIVE Final    Comment: (NOTE) The Xpert Xpress SARS-CoV-2/FLU/RSV plus assay is intended as an aid in the diagnosis of influenza from Nasopharyngeal swab specimens and should not be used as a sole basis for treatment. Nasal washings and aspirates are unacceptable for Xpert Xpress SARS-CoV-2/FLU/RSV testing.  Fact Sheet for Patients: EntrepreneurPulse.com.au  Fact Sheet for Healthcare Providers: IncredibleEmployment.be  This test is not yet approved or cleared by the [place] FDA and has been authorized for detection and/or diagnosis of SARS-CoV-2 by FDA under an Emergency Use Authorization (EUA). This EUA will remain in effect (meaning this test can be used) for the duration of the COVID-19 declaration under Section 564(b)(1) of the Act, 21 U.S.C. section 360bbb-3(b)(1), unless the authorization is terminated or revoked.     Resp Syncytial Virus by PCR NEGATIVE NEGATIVE Final    Comment: (NOTE) Fact Sheet for Patients: EntrepreneurPulse.com.au  Fact Sheet for Healthcare  Providers: IncredibleEmployment.be  This test is not yet approved or cleared by the [place] and has been authorized for detection and/or diagnosis of SARS-CoV-2 by FDA under an Emergency Use Authorization (EUA). This EUA will remain in effect (meaning this test can be used) for the duration of the COVID-19 declaration under Section 564(b)(1) of the Act, 21 U.S.C. section 360bbb-3(b)(1), unless the authorization is terminated or revoked.  Performed at [facility], [place] [address], [place], [place] [address]   Blood Culture ID Panel (Reflexed)     Status: None   Collection Time: 06/21/22 12:15 PM  Result Value Ref Range Status   Enterococcus faecalis NOT DETECTED NOT DETECTED Final   Enterococcus Faecium NOT DETECTED NOT DETECTED Final   Listeria monocytogenes NOT DETECTED NOT DETECTED Final   Staphylococcus species NOT DETECTED NOT DETECTED Final   Staphylococcus aureus (BCID) NOT DETECTED NOT DETECTED Final   Staphylococcus epidermidis NOT DETECTED NOT DETECTED Final   Staphylococcus lugdunensis NOT DETECTED NOT DETECTED Final   Streptococcus species NOT DETECTED NOT DETECTED Final   Streptococcus agalactiae NOT DETECTED NOT DETECTED Final   Streptococcus pneumoniae NOT DETECTED NOT DETECTED Final   Streptococcus pyogenes NOT DETECTED NOT DETECTED Final   A.calcoaceticus-baumannii NOT DETECTED NOT DETECTED Final   Bacteroides fragilis NOT DETECTED NOT DETECTED Final   Enterobacterales NOT DETECTED NOT DETECTED Final   Enterobacter cloacae complex NOT DETECTED NOT DETECTED Final   Escherichia coli NOT DETECTED NOT DETECTED Final   Klebsiella aerogenes NOT DETECTED NOT DETECTED Final   Klebsiella oxytoca NOT DETECTED NOT DETECTED Final   Klebsiella pneumoniae NOT DETECTED NOT DETECTED Final   Proteus species NOT DETECTED NOT DETECTED Final   Salmonella species NOT DETECTED NOT DETECTED Final   Serratia marcescens NOT DETECTED NOT DETECTED Final   Haemophilus influenzae NOT DETECTED NOT DETECTED Final   Neisseria meningitidis NOT DETECTED NOT DETECTED Final   Pseudomonas aeruginosa NOT DETECTED NOT DETECTED Final   Stenotrophomonas maltophilia NOT DETECTED NOT DETECTED Final   Candida albicans NOT DETECTED NOT DETECTED Final   Candida auris NOT DETECTED NOT DETECTED Final   Candida glabrata NOT DETECTED NOT DETECTED Final   Candida krusei NOT DETECTED NOT DETECTED Final   Candida parapsilosis NOT DETECTED NOT DETECTED Final   Candida tropicalis NOT  DETECTED NOT DETECTED Final   Cryptococcus neoformans/gattii NOT DETECTED NOT DETECTED Final    Comment: Performed at [facility], [address], [place], [place] [address]  MRSA Next Gen by PCR, Nasal     Status: None   Collection Time: 06/21/22  4:53 PM   Specimen: Nasal Mucosa; Nasal Swab  Result Value Ref Range Status   MRSA by PCR Next Gen NOT DETECTED NOT DETECTED Final    Comment: (NOTE) The GeneXpert MRSA Assay (FDA approved for NASAL specimens only), is one component of a comprehensive MRSA colonization surveillance program. It is not intended to diagnose MRSA infection nor to guide or monitor treatment for MRSA infections. Test performance is not FDA approved in patients less than 37 years old. Performed at [facility], [place] [address] [place], [place] [address]     RADIOLOGY STUDIES/RESULTS: CT Angio Chest PE W and/or Wo Contrast  Result Date: 06/21/2022 CLINICAL DATA:  Dyspnea, hypoxia EXAM: CT ANGIOGRAPHY CHEST WITH CONTRAST TECHNIQUE: Multidetector CT imaging of the chest was performed using the standard protocol during bolus administration of intravenous contrast. Multiplanar CT image reconstructions and MIPs were obtained to evaluate the vascular  anatomy. RADIATION DOSE REDUCTION: This exam was performed according to the departmental dose-optimization program which includes automated exposure control, adjustment of the mA and/or kV according to patient size and/or use of iterative reconstruction technique. CONTRAST:  55m OMNIPAQUE IOHEXOL 350 MG/ML SOLN COMPARISON:  02/02/2022 FINDINGS: Cardiovascular: There is adequate opacification of the pulmonary arterial tree. No intraluminal filling defect identified to suggest acute pulmonary embolism. The central pulmonary arteries are enlarged in keeping with changes of pulmonary arterial hypertension. Global cardiac size is mildly enlarged. Small pericardial effusion. Mild coronary artery calcification. Extensive  atherosclerotic calcification within the thoracic aorta. No aortic aneurysm. Mediastinum/Nodes: No enlarged mediastinal, hilar, or axillary lymph nodes. Thyroid gland, trachea, and esophagus demonstrate no significant findings. Lungs/Pleura: Mild right basilar dependent atelectasis. Lungs are otherwise clear save for a benign calcified granuloma within the left upper lobe. No pneumothorax or pleural effusion. Central airways are widely patent. Upper Abdomen: No acute abnormality. Musculoskeletal: No acute bone abnormality. No lytic or blastic bone lesion. Review of the MIP images confirms the above findings. IMPRESSION: 1. No pulmonary embolism. No acute intrathoracic pathology identified. 2. Mild cardiomegaly. Mild coronary artery calcification. 3. Morphologic changes in keeping with pulmonary arterial hypertension. Aortic Atherosclerosis (ICD10-I70.0). Electronically Signed   By: AFidela SalisburyM.D.   On: 06/21/2022 18:59   CT Head Wo Contrast  Result Date: 06/21/2022 CLINICAL DATA:  Altered level of consciousness, short of breath EXAM: CT HEAD WITHOUT CONTRAST TECHNIQUE: Contiguous axial images were obtained from the base of the skull through the vertex without intravenous contrast. RADIATION DOSE REDUCTION: This exam was performed according to the departmental dose-optimization program which includes automated exposure control, adjustment of the mA and/or kV according to patient size and/or use of iterative reconstruction technique. COMPARISON:  01/29/2021 FINDINGS: Brain: No evidence of acute infarct or hemorrhage. The lateral ventricles and midline structures are stable. Stable coarse calcifications within the central pons. Diffuse cerebral atrophy, with continued prominence of the extra-axial CSF spaces. No acute extra-axial fluid collection. No mass effect. Vascular: Stable atherosclerosis.  No hyperdense vessel. Skull: Normal. Negative for fracture or focal lesion. Sinuses/Orbits: Small bilateral  mastoid effusions are unchanged. Remaining paranasal sinuses are clear. Other: None. IMPRESSION: 1. Stable head CT, no acute intracranial process. Electronically Signed   By: MRanda NgoM.D.   On: 06/21/2022 18:41   DG Chest Port 1 View  Result Date: 06/21/2022 CLINICAL DATA:  Sepsis. EXAM: PORTABLE CHEST 1 VIEW COMPARISON:  June 14, 2022. FINDINGS: Stable cardiomediastinal silhouette. Both lungs are clear. The visualized skeletal structures are unremarkable. IMPRESSION: No active disease. Aortic Atherosclerosis (ICD10-I70.0). Electronically Signed   By: JMarijo ConceptionM.D.   On: 06/21/2022 12:39     LOS: 2 days   SOren Binet MD  [facility]    To contact the attending provider between 7A-7P or the covering provider during after hours 7P-7A, please log into the web site [email or web address] and access using universal [place] password for that web site. If you do not have the password, please call the hospital operator.  06/23/2022, 10:10 AM

## 2022-06-23 NOTE — Care Management (Signed)
  Transition of Care [facility]) Screening Note   Patient Details  Name: Jeffrey Payne Date of Birth: 12-18-1932   Transition of [facility]) CM/SW Contact:    Carles Collet, RN Phone Number: 06/23/2022, 8:12 AM    Transition of Care Department [facility]) has reviewed patient and we will continue to monitor patient advancement through interdisciplinary progression rounds.   Patient admitted from [place] where he lives with his wife. Patient was recently discharged 2/20.   Admitted for further evaluation management of acute hypoxic respiratory failure in the setting of acute viral bronchitis secondary to metapneumovirus, with presentation also notable for mildly elevated troponin and acute metabolic encephalopathy.

## 2022-06-23 NOTE — Progress Notes (Addendum)
Pharmacy Antibiotic Note  Jeffrey Payne is a 89 y.o. male admitted on 06/21/2022 with sepsis, unknown source.  Pharmacy has been consulted for vancomycin dosing.   Patient is ESRD HD TTS outpatient, last HD 2/27. Vancomycin dose not charted after HD yesterday.  WBC trending down today. Afebrile.  Blood culture with GPC (2 bottles from same site) and BCID did not identify, speciation pending.     Plan: Continue Vancomycin '750mg'$  IV post HD Tuesday-Thursday-Saturday.  Monitor C&S, HD plans, s/sx improvement, pre-HD vancomycin levels as indicated   Addendum: HD session stopped after 1hr 32mn. Will not enter extra dose today and proceed with current regimen.   Height: '5\' 7"'$  (170.2 cm) Weight: 72.1 kg (159 lb) IBW/kg (Calculated) : 66.1  Temp (24hrs), Avg:98.5 F (36.9 C), Min:98.4 F (36.9 C), Max:98.6 F (37 C)  Recent Labs  Lab 06/21/22 1215 06/21/22 1508 06/22/22 0340 06/23/22 0254  WBC 24.4*  --  33.2* 19.5*  CREATININE 5.60*  --  6.16* 5.69*  LATICACIDVEN 2.4* 1.1  --   --     Estimated Creatinine Clearance: 8.6 mL/min (A) (by C-G formula based on SCr of 5.69 mg/dL (H)).    No Known Allergies  Antimicrobials this admission: vancomycin 2/26 >>  cefepime 2/26 >> 2/27 metronidazole 2/26 x 1  Microbiology results: 2/26 BCx: GPC (BCID- undetected) 2/26 RVP: neg 2/26 MRSA pcr: neg  Thank you for allowing pharmacy to be a part of this patient's care.  JSloan Leiter PharmD, BCPS, BCCCP Clinical Pharmacist Please refer to [facility]for MTiptonnumbers 06/23/2022  11:12 AM

## 2022-06-24 DIAGNOSIS — N186 End stage renal disease: Secondary | ICD-10-CM | POA: Diagnosis not present

## 2022-06-24 DIAGNOSIS — I10 Essential (primary) hypertension: Secondary | ICD-10-CM | POA: Diagnosis not present

## 2022-06-24 DIAGNOSIS — A419 Sepsis, unspecified organism: Secondary | ICD-10-CM | POA: Diagnosis not present

## 2022-06-24 DIAGNOSIS — I129 Hypertensive chronic kidney disease with stage 1 through stage 4 chronic kidney disease, or unspecified chronic kidney disease: Secondary | ICD-10-CM | POA: Diagnosis not present

## 2022-06-24 DIAGNOSIS — J9601 Acute respiratory failure with hypoxia: Secondary | ICD-10-CM | POA: Diagnosis not present

## 2022-06-24 DIAGNOSIS — Z992 Dependence on renal dialysis: Secondary | ICD-10-CM | POA: Diagnosis not present

## 2022-06-24 LAB — RENAL FUNCTION PANEL
Albumin: 2.4 g/dL — ABNORMAL LOW (ref 3.5–5.0)
Anion gap: 17 — ABNORMAL HIGH (ref 5–15)
BUN: 60 mg/dL — ABNORMAL HIGH (ref 8–23)
CO2: 23 mmol/L (ref 22–32)
Calcium: 9.1 mg/dL (ref 8.9–10.3)
Chloride: 97 mmol/L — ABNORMAL LOW (ref 98–111)
Creatinine, Ser: 6.18 mg/dL — ABNORMAL HIGH (ref 0.61–1.24)
GFR, Estimated: 8 mL/min — ABNORMAL LOW (ref >60)
Glucose, Bld: 97 mg/dL (ref 70–99)
Phosphorus: 4.8 mg/dL — ABNORMAL HIGH (ref 2.5–4.6)
Potassium: 4 mmol/L (ref 3.5–5.1)
Sodium: 137 mmol/L (ref 135–145)

## 2022-06-24 LAB — CBC
HCT: 38 % — ABNORMAL LOW (ref 39.0–52.0)
Hemoglobin: 13 g/dL (ref 13.0–17.0)
MCH: 33.9 pg (ref 26.0–34.0)
MCHC: 34.2 g/dL (ref 30.0–36.0)
MCV: 99 fL (ref 80.0–100.0)
Platelets: 202 10*3/uL (ref 150–400)
RBC: 3.84 MIL/uL — ABNORMAL LOW (ref 4.22–5.81)
RDW: 15.8 % — ABNORMAL HIGH (ref 11.5–15.5)
WBC: 4.5 10*3/uL (ref 4.0–10.5)

## 2022-06-24 LAB — CULTURE, BLOOD (ROUTINE X 2): Special Requests: ADEQUATE

## 2022-06-24 MED ORDER — METOPROLOL TARTRATE 12.5 MG HALF TABLET
12.5000 mg | ORAL_TABLET | Freq: Two times a day (BID) | ORAL | Status: DC
  Administered 2022-06-24 – 2022-06-29 (×9): 12.5 mg via ORAL
  Filled 2022-06-24 (×10): qty 1

## 2022-06-24 MED ORDER — MIDODRINE HCL 5 MG PO TABS
10.0000 mg | ORAL_TABLET | Freq: Three times a day (TID) | ORAL | Status: DC
  Administered 2022-06-24 – 2022-06-29 (×13): 10 mg via ORAL
  Filled 2022-06-24 (×13): qty 2

## 2022-06-24 MED ORDER — METOPROLOL TARTRATE 12.5 MG HALF TABLET: 12.5000 mg | ORAL_TABLET | Freq: Two times a day (BID) | ORAL | Status: DC

## 2022-06-24 NOTE — Progress Notes (Signed)
Jeffrey Payne Progress Note    Assessment/ Plan:   ESRD: -outpatient orders: [place].  TTS.  F160.  Flow rates: 400/autoflow 1.5.  EDW 52.5 kg.  3 hours.  2K/2 calcium.  Right forearm aVF.  Meds: Calcitriol 0.5 mcg q. treatment -HD on TTS sched   AHRF -possible sequelae of recent bronchitis -no evidence of fluid on chest imaging   Sepsis, gram positive bacteremia -2/26 bcx pos aerococcus viridans -vanc per primary service   Volume:  -will UF as tolerated -on midodrine for soft Bps. BP's much better now   Anemia of Chronic Kidney Disease:  -Hemoglobin 13, at goal for ESRD   Secondary Hyperparathyroidism/Hyperphosphatemia:  -PO4 down to 4.8. On phoslo and calitriol     Subjective:   Patient seen and examined  on HD. Tolerating treatment. No issues with cannulation, no pain. No complaints.   Objective:   BP (!) 140/56 (BP Location: Left Arm)   Pulse 78   Temp 98.1 F (36.7 C) (Oral)   Resp (!) 22   Ht '5\' 7"'$  (1.702 m)   Wt 51.7 kg   SpO2 96%   BMI 17.85 kg/m   Intake/Output Summary (Last 24 hours) at 06/24/2022 0930 Last data filed at 06/23/2022 1700 Gross per 24 hour  Intake --  Output 400 ml  Net -400 ml   Weight change:   Physical Exam: Gen: NAD, laying flat in bed CVS: RRR Resp: cta b/l Abd: soft Ext: no edema Neuro: awake, alert Dialysis access: right forearm AVF in use  Imaging: ECHOCARDIOGRAM COMPLETE  Result Date: 06/23/2022    ECHOCARDIOGRAM REPORT   Patient Name:   Jeffrey Payne Date of Exam: 06/23/2022 Medical Rec #:  [id number]      Height:       67.0 in Accession #:    [id number]     Weight:       159.0 lb Date of Birth:  [birth year]      BSA:          1.834 m Patient Age:    89 years       BP:           102/42 mmHg Patient Gender: M              HR:           73 bpm. Exam Location:  Inpatient Procedure: 2D Echo, Cardiac Doppler and Color Doppler Indications:    Elevated Troponin  History:        Patient has prior history of  Echocardiogram examinations, most                 recent 02/01/2021. CHF, Arrythmias:LBBB and Bradycardia,                 Signs/Symptoms:Hypotension and Shortness of Breath; Risk                 Factors:Hypertension and Dyslipidemia.  Sonographer:    Ronny Flurry Referring Phys: [id number] [place]  1. Left ventricular ejection fraction, by estimation, is 30 to 35%. The left ventricle has moderately decreased function. The left ventricle demonstrates global hypokinesis. There is mild concentric left ventricular hypertrophy. Left ventricular diastolic parameters are indeterminate.  2. Right ventricular systolic function is moderately reduced. The right ventricular size is normal.  3. The mitral valve is normal in structure. No evidence of mitral valve regurgitation. No evidence of mitral stenosis.  4. The aortic valve is tricuspid. There is mild calcification  of the aortic valve. Aortic valve regurgitation is mild. Aortic valve sclerosis/calcification is present, without any evidence of aortic stenosis.  5. The inferior vena cava is normal in size with greater than 50% respiratory variability, suggesting right atrial pressure of 3 mmHg. FINDINGS  Left Ventricle: Left ventricular ejection fraction, by estimation, is 30 to 35%. The left ventricle has moderately decreased function. The left ventricle demonstrates global hypokinesis. The left ventricular internal cavity size was normal in size. There is mild concentric left ventricular hypertrophy. Abnormal (paradoxical) septal motion, consistent with left bundle branch block. Left ventricular diastolic parameters are indeterminate. Right Ventricle: The right ventricular size is normal. No increase in right ventricular wall thickness. Right ventricular systolic function is moderately reduced. Left Atrium: Left atrial size was normal in size. Right Atrium: Right atrial size was normal in size. Pericardium: There is no evidence of pericardial  effusion. Mitral Valve: The mitral valve is normal in structure. Mild mitral annular calcification. No evidence of mitral valve regurgitation. No evidence of mitral valve stenosis. Tricuspid Valve: The tricuspid valve is normal in structure. Tricuspid valve regurgitation is not demonstrated. No evidence of tricuspid stenosis. Aortic Valve: The aortic valve is tricuspid. There is mild calcification of the aortic valve. Aortic valve regurgitation is mild. Aortic valve sclerosis/calcification is present, without any evidence of aortic stenosis. Aortic valve mean gradient measures 3.5 mmHg. Aortic valve peak gradient measures 5.5 mmHg. Aortic valve area, by VTI measures 2.73 cm. Pulmonic Valve: The pulmonic valve was normal in structure. Pulmonic valve regurgitation is not visualized. No evidence of pulmonic stenosis. Aorta: The aortic root is normal in size and structure. Venous: The inferior vena cava is normal in size with greater than 50% respiratory variability, suggesting right atrial pressure of 3 mmHg. IAS/Shunts: No atrial level shunt detected by color flow Doppler.  LEFT VENTRICLE PLAX 2D LVIDd:         5.00 cm LVIDs:         4.10 cm LV PW:         1.10 cm LV IVS:        1.10 cm LVOT diam:     2.00 cm LV SV:         55 LV SV Index:   30 LVOT Area:     3.14 cm  RIGHT VENTRICLE         IVC TAPSE (M-mode): 1.7 cm  IVC diam: 1.50 cm LEFT ATRIUM             Index        RIGHT ATRIUM           Index LA diam:        3.70 cm 2.02 cm/m   RA Area:     13.40 cm LA Vol (A2C):   18.9 ml 10.30 ml/m  RA Volume:   30.00 ml  16.35 ml/m LA Vol (A4C):   39.2 ml 21.34 ml/m LA Biplane Vol: 26.5 ml 14.45 ml/m  AORTIC VALVE AV Area (Vmax):    2.54 cm AV Area (Vmean):   2.31 cm AV Area (VTI):     2.73 cm AV Vmax:           117.50 cm/s AV Vmean:          81.750 cm/s AV VTI:            0.202 m AV Peak Grad:      5.5 mmHg AV Mean Grad:      3.5 mmHg LVOT Vmax:  94.90 cm/s LVOT Vmean:        60.000 cm/s LVOT VTI:           0.175 m LVOT/AV VTI ratio: 0.87  AORTA Ao Root diam: 3.50 cm Ao Asc diam:  3.60 cm  SHUNTS Systemic VTI:  0.18 m Systemic Diam: 2.00 cm Glori Bickers MD Electronically signed by Glori Bickers MD Signature Date/Time: 06/23/2022/11:28:21 AM    Final     Labs: BMET Recent Labs  Lab 06/21/22 1215 06/22/22 0340 06/22/22 0351 06/23/22 0254 06/24/22 0433  NA 141 137 133* 136 137  K 3.6 4.3 5.6* 4.2 4.0  CL 96* 92*  --  96* 97*  CO2 24 22  --  25 23  GLUCOSE 74 86  --  82 97  BUN 28* 39*  --  42* 60*  CREATININE 5.60* 6.16*  --  5.69* 6.18*  CALCIUM 9.1 8.5*  --  8.8* 9.1  PHOS  --  5.2*  --  6.1* 4.8*   CBC Recent Labs  Lab 06/21/22 1215 06/22/22 0340 06/22/22 0351 06/23/22 0254 06/23/22 1125 06/24/22 0433  WBC 24.4* 33.2*  --  19.5* 17.5* 4.5  NEUTROABS 22.7* 31.2*  --   --  14.7*  --   HGB 14.7 13.0 13.9 12.5* 12.8* 13.0  HCT 44.5 40.0 41.0 37.4* 38.5* 38.0*  MCV 102.5* 102.6*  --  98.7 99.7 99.0  PLT 244 227  --  197 193 202    Medications:     calcitRIOL  0.5 mcg Oral Q T,Th,Sa-HD   calcium acetate  667 mg Oral TID WC   Chlorhexidine Gluconate Cloth  6 each Topical Q0600   feeding supplement (NEPRO CARB STEADY)  237 mL Oral TID BM   loratadine  10 mg Oral Daily   midodrine  10 mg Oral BID WC   tamsulosin  0.4 mg Oral Daily      Gean Quint, MD [facility] 06/24/2022, 9:30 AM

## 2022-06-24 NOTE — Progress Notes (Addendum)
PROGRESS NOTE        PATIENT DETAILS Name: Jeffrey Payne Age: 89 y.o. Sex: male Date of Birth: 1933/01/29 Admit Date: 06/21/2022 Admitting Physician Rhetta Mura, DO [id number], Christoper Fabian, MD  Brief Summary: Patient is a 89 y.o.  male w hx of chronic HFpEF/HFrEF, ESRD on HD TTS who presented to the hospital with acute hypoxic respiratory failure in the setting of acute bronchitis due to [place] virus.  Significant events: 2/19-2/20>> hospitalization for hypoxia from decompensated heart failure/Metapneumonia virus. 2/26>> admit to [facility]  Significant studies: 2/26>> CTA chest: No PE/no obvious PNA 2/26>> CT head: No acute intracranial process. 2/28>> echo: EF [id number] RV systolic function moderately reduced.  Significant microbiology data: 2/19>> respiratory virus panel:+ve Metapneumonia virus 2/26>> COVID/influenza/RSV PCR: Negative 2/26>> blood culture: Aerococcus Viridans  Procedures: None  Consults: Renal  Subjective: Lying comfortably in bed-no major issues overnight.  Objective: Vitals: Blood pressure (!) 124/51, pulse 90, temperature 98.1 F (36.7 C), temperature source Oral, resp. rate (!) 22, height '5\' 7"'$  (1.702 m), weight 51.7 kg, SpO2 97 %.   Gen Exam:Alert awake-not in any distress HEENT:atraumatic, normocephalic Chest: B/L clear to auscultation anteriorly CVS:S1S2 regular Abdomen:soft non tender, non distended Extremities:no edema Neurology: Non focal Skin: no rash  Pertinent Labs/Radiology:    Latest Ref Rng & Units 06/24/2022    4:33 AM 06/23/2022   11:25 AM 06/23/2022    2:54 AM  CBC  WBC 4.0 - 10.5 K/uL 4.5  17.5  19.5   Hemoglobin 13.0 - 17.0 g/dL 13.0  12.8  12.5   Hematocrit 39.0 - 52.0 % 38.0  38.5  37.4   Platelets 150 - 400 K/uL 202  193  197     Lab Results  Component Value Date   NA 137 06/24/2022   K 4.0 06/24/2022   CL 97 (L) 06/24/2022   CO2 23 06/24/2022      Assessment/Plan: Severe sepsis   Acute hypoxic respiratory failure due to bronchitis in the setting of recent Meta pneumonia virus Improving Stable on minimal amount of oxygen Continue supportive care Continue bronchodilators Leukocytosis is resolved Blood culture Aerococcus viridans-likely a contaminant.   Will stop antibiotics and repeat cultures prior to discharge.  Delirium Slightly confused on 2/28 Completely awake/alert today  ESRD on HD TTS Nephrology following  Chronic HFrEF Volume status stable Volume removal with HD  HTN BP stable on midodrine Resume low dose beta blocker  Hx of PAF/MAT/SVT Resume low dose beta blocker Reveiwed prior cards note-not a candidate for anticoag  Debility/deconditioning Due to acute illness Await PT/OT eval to determine appropriate disposition  BMI: Estimated body mass index is 17.85 kg/m as calculated from the following:   Height as of this encounter: '5\' 7"'$  (1.702 m).   Weight as of this encounter: 51.7 kg.   Code status:   Code Status: Full Code (Addendum:d/w spouse at bedside, she was to continue with treatment-but does not want CPR/Intubation-she is agreeable w a DNR order)  DVT Prophylaxis: SCDs Start: 06/21/22 1945   Family Communication: Spouse at bedside on 2/29   Disposition Plan: Status is: Inpatient Remains inpatient appropriate because: Severity of illness   Planned Discharge Destination:Home health    Diet: Diet Order             Diet renal with fluid restriction Room service appropriate? No; Fluid consistency: Thin  Diet effective now                     Antimicrobial agents: Anti-infectives (From admission, onward)    Start     Dose/Rate Route Frequency Ordered Stop   06/23/22 1200  vancomycin (VANCOREADY) IVPB 750 mg/150 mL  Status:  Discontinued        750 mg 150 mL/hr over 60 Minutes Intravenous  Once 06/23/22 1112 06/23/22 1124   06/22/22 2200  ceFEPIme (MAXIPIME) 1 g in sodium chloride 0.9 % 100 mL IVPB  Status:   Discontinued        1 g 200 mL/hr over 30 Minutes Intravenous Every 24 hours 06/21/22 1656 06/22/22 0942   06/22/22 1800  vancomycin (VANCOREADY) IVPB 750 mg/150 mL        750 mg 150 mL/hr over 60 Minutes Intravenous Every T-Th-Sa (1800) 06/22/22 1003     06/21/22 1445  ceFEPIme (MAXIPIME) 1 g in sodium chloride 0.9 % 100 mL IVPB        1 g 200 mL/hr over 30 Minutes Intravenous  Once 06/21/22 1432 06/21/22 1524   06/21/22 1445  vancomycin (VANCOREADY) IVPB 1500 mg/300 mL        1,500 mg 150 mL/hr over 120 Minutes Intravenous  Once 06/21/22 1432 06/21/22 1734   06/21/22 1435  vancomycin variable dose per unstable renal function (pharmacist dosing)  Status:  Discontinued         Does not apply See admin instructions 06/21/22 1435 06/22/22 1003   06/21/22 1430  ceFEPIme (MAXIPIME) 1 g in sodium chloride 0.9 % 100 mL IVPB  Status:  Discontinued        1 g 200 mL/hr over 30 Minutes Intravenous  Once 06/21/22 1416 06/21/22 1432   06/21/22 1430  metroNIDAZOLE (FLAGYL) IVPB 500 mg        500 mg 100 mL/hr over 60 Minutes Intravenous  Once 06/21/22 1416 06/21/22 1636   06/21/22 1430  vancomycin (VANCOCIN) IVPB 1000 mg/200 mL premix  Status:  Discontinued        1,000 mg 200 mL/hr over 60 Minutes Intravenous  Once 06/21/22 1416 06/21/22 1432        MEDICATIONS: Scheduled Meds:  calcitRIOL  0.5 mcg Oral Q T,Th,Sa-HD   calcium acetate  667 mg Oral TID WC   Chlorhexidine Gluconate Cloth  6 each Topical Q0600   feeding supplement (NEPRO CARB STEADY)  237 mL Oral TID BM   loratadine  10 mg Oral Daily   midodrine  10 mg Oral BID WC   tamsulosin  0.4 mg Oral Daily   Continuous Infusions:  vancomycin     PRN Meds:.acetaminophen **OR** acetaminophen, albuterol, ALPRAZolam, benzonatate, HYDROcodone-acetaminophen, ipratropium-albuterol, melatonin   I have personally reviewed following labs and imaging studies  LABORATORY DATA: CBC: Recent Labs  Lab 06/21/22 1215 06/22/22 0340  06/22/22 0351 06/23/22 0254 06/23/22 1125 06/24/22 0433  WBC 24.4* 33.2*  --  19.5* 17.5* 4.5  NEUTROABS 22.7* 31.2*  --   --  14.7*  --   HGB 14.7 13.0 13.9 12.5* 12.8* 13.0  HCT 44.5 40.0 41.0 37.4* 38.5* 38.0*  MCV 102.5* 102.6*  --  98.7 99.7 99.0  PLT 244 227  --  197 193 202     Basic Metabolic Panel: Recent Labs  Lab 06/21/22 1215 06/21/22 2245 06/22/22 0340 06/22/22 0351 06/23/22 0254 06/24/22 0433  NA 141  --  137 133* 136 137  K 3.6  --  4.3 5.6*  4.2 4.0  CL 96*  --  92*  --  96* 97*  CO2 24  --  22  --  25 23  GLUCOSE 74  --  86  --  82 97  BUN 28*  --  39*  --  42* 60*  CREATININE 5.60*  --  6.16*  --  5.69* 6.18*  CALCIUM 9.1  --  8.5*  --  8.8* 9.1  MG  --  1.9 1.7  --   --   --   PHOS  --   --  5.2*  --  6.1* 4.8*     GFR: Estimated Creatinine Clearance: 6.2 mL/min (A) (by C-G formula based on SCr of 6.18 mg/dL (H)).  Liver Function Tests: Recent Labs  Lab 06/21/22 1215 06/22/22 0340 06/23/22 0254 06/24/22 0433  AST 16 15  --   --   ALT 9 10  --   --   ALKPHOS 112 102  --   --   BILITOT 1.9* 1.8*  --   --   PROT 6.6 6.2*  --   --   ALBUMIN 3.0* 2.6* 2.3* 2.4*    No results for input(s): "LIPASE", "AMYLASE" in the last 168 hours. Recent Labs  Lab 06/22/22 0340  AMMONIA 33     Coagulation Profile: Recent Labs  Lab 06/22/22 0340  INR 1.2     Cardiac Enzymes: No results for input(s): "CKTOTAL", "CKMB", "CKMBINDEX", "TROPONINI" in the last 168 hours.  BNP (last 3 results) No results for input(s): "PROBNP" in the last 8760 hours.  Lipid Profile: No results for input(s): "CHOL", "HDL", "LDLCALC", "TRIG", "CHOLHDL", "LDLDIRECT" in the last 72 hours.  Thyroid Function Tests: Recent Labs    06/21/22 2245  TSH 1.624     Anemia Panel: Recent Labs    06/21/22 2245  VITAMINB12 813     Urine analysis:    Component Value Date/Time   COLORURINE YELLOW 04/06/2022 [place] 04/06/2022 1554   LABSPEC 1.010  04/06/2022 1554   PHURINE 7.5 04/06/2022 1554   GLUCOSEU NEGATIVE 04/06/2022 1554   HGBUR TRACE (A) 04/06/2022 1554   BILIRUBINUR NEGATIVE 04/06/2022 1554   KETONESUR NEGATIVE 04/06/2022 1554   PROTEINUR 100 (A) 04/06/2022 1554   NITRITE NEGATIVE 04/06/2022 1554   LEUKOCYTESUR NEGATIVE 04/06/2022 1554    Sepsis Labs: Lactic Acid, Venous    Component Value Date/Time   LATICACIDVEN 1.1 06/21/2022 1508    MICROBIOLOGY: Recent Results (from the past 240 hour(s))  Resp panel by RT-PCR (RSV, Flu A&B, Covid) Anterior Nasal Swab     Status: None   Collection Time: 06/14/22 12:11 PM   Specimen: Anterior Nasal Swab  Result Value Ref Range Status   SARS Coronavirus 2 by RT PCR NEGATIVE NEGATIVE Final   Influenza A by PCR NEGATIVE NEGATIVE Final   Influenza B by PCR NEGATIVE NEGATIVE Final    Comment: (NOTE) The Xpert Xpress SARS-CoV-2/FLU/RSV plus assay is intended as an aid in the diagnosis of influenza from Nasopharyngeal swab specimens and should not be used as a sole basis for treatment. Nasal washings and aspirates are unacceptable for Xpert Xpress SARS-CoV-2/FLU/RSV testing.  Fact Sheet for Patients: EntrepreneurPulse.com.au  Fact Sheet for Healthcare Providers: IncredibleEmployment.be  This test is not yet approved or cleared by the [place] FDA and has been authorized for detection and/or diagnosis of SARS-CoV-2 by FDA under an Emergency Use Authorization (EUA). This EUA will remain in effect (meaning this test can be used) for  the duration of the COVID-19 declaration under Section 564(b)(1) of the Act, 21 U.S.C. section 360bbb-3(b)(1), unless the authorization is terminated or revoked.     Resp Syncytial Virus by PCR NEGATIVE NEGATIVE Final    Comment: (NOTE) Fact Sheet for Patients: EntrepreneurPulse.com.au  Fact Sheet for Healthcare Providers: IncredibleEmployment.be  This test is not  yet approved or cleared by the [place] FDA and has been authorized for detection and/or diagnosis of SARS-CoV-2 by FDA under an Emergency Use Authorization (EUA). This EUA will remain in effect (meaning this test can be used) for the duration of the COVID-19 declaration under Section 564(b)(1) of the Act, 21 U.S.C. section 360bbb-3(b)(1), unless the authorization is terminated or revoked.  Performed at [facility], [place] [address] [place], [place] [address]   Respiratory (~20 pathogens) panel by PCR     Status: Abnormal   Collection Time: 06/14/22  2:32 PM   Specimen: Nasopharyngeal Swab; Respiratory  Result Value Ref Range Status   Adenovirus NOT DETECTED NOT DETECTED Final   Coronavirus 229E NOT DETECTED NOT DETECTED Final    Comment: (NOTE) The Coronavirus on the Respiratory Panel, DOES NOT test for the novel  Coronavirus (2019 nCoV)    Coronavirus HKU1 NOT DETECTED NOT DETECTED Final   Coronavirus NL63 NOT DETECTED NOT DETECTED Final   Coronavirus OC43 NOT DETECTED NOT DETECTED Final   Metapneumovirus DETECTED (A) NOT DETECTED Final   Rhinovirus / Enterovirus NOT DETECTED NOT DETECTED Final   Influenza A NOT DETECTED NOT DETECTED Final   Influenza B NOT DETECTED NOT DETECTED Final   Parainfluenza Virus 1 NOT DETECTED NOT DETECTED Final   Parainfluenza Virus 2 NOT DETECTED NOT DETECTED Final   Parainfluenza Virus 3 NOT DETECTED NOT DETECTED Final   Parainfluenza Virus 4 NOT DETECTED NOT DETECTED Final   Respiratory Syncytial Virus NOT DETECTED NOT DETECTED Final   Bordetella pertussis NOT DETECTED NOT DETECTED Final   Bordetella Parapertussis NOT DETECTED NOT DETECTED Final   Chlamydophila pneumoniae NOT DETECTED NOT DETECTED Final   Mycoplasma pneumoniae NOT DETECTED NOT DETECTED Final    Comment: Performed at [facility], [address] [place], [place] [address]  Blood Culture (routine x 2)     Status: Abnormal   Collection Time: 06/21/22 12:15 PM    Specimen: BLOOD  Result Value Ref Range Status   Specimen Description BLOOD LEFT ANTECUBITAL  Final   Special Requests   Final    BOTTLES DRAWN AEROBIC AND ANAEROBIC Blood Culture adequate volume   Culture  Setup Time   Final    GRAM POSITIVE COCCI IN BOTH AEROBIC AND ANAEROBIC BOTTLES Organism ID to follow CRITICAL RESULT CALLED TO, READ BACK BY AND VERIFIED WITH: Ezekiel Slocumb PHARMD, AT [id number] 06/22/22 D. VANHOOK    Culture (A)  Final    AEROCOCCUS VIRIDANS Standardized susceptibility testing for this organism is not available. Performed at [facility], [place] [address], [place], [place] [address]    Report Status 06/24/2022 FINAL  Final  Resp panel by RT-PCR (RSV, Flu A&B, Covid) Anterior Nasal Swab     Status: None   Collection Time: 06/21/22 12:15 PM   Specimen: Anterior Nasal Swab  Result Value Ref Range Status   SARS Coronavirus 2 by RT PCR NEGATIVE NEGATIVE Final   Influenza A by PCR NEGATIVE NEGATIVE Final   Influenza B by PCR NEGATIVE NEGATIVE Final    Comment: (NOTE) The Xpert Xpress SARS-CoV-2/FLU/RSV plus assay is intended as an aid  in the diagnosis of influenza from Nasopharyngeal swab specimens and should not be used as a sole basis for treatment. Nasal washings and aspirates are unacceptable for Xpert Xpress SARS-CoV-2/FLU/RSV testing.  Fact Sheet for Patients: EntrepreneurPulse.com.au  Fact Sheet for Healthcare Providers: IncredibleEmployment.be  This test is not yet approved or cleared by the [place] FDA and has been authorized for detection and/or diagnosis of SARS-CoV-2 by FDA under an Emergency Use Authorization (EUA). This EUA will remain in effect (meaning this test can be used) for the duration of the COVID-19 declaration under Section 564(b)(1) of the Act, 21 U.S.C. section 360bbb-3(b)(1), unless the authorization is terminated or revoked.     Resp Syncytial Virus by PCR NEGATIVE NEGATIVE Final     Comment: (NOTE) Fact Sheet for Patients: EntrepreneurPulse.com.au  Fact Sheet for Healthcare Providers: IncredibleEmployment.be  This test is not yet approved or cleared by the [place] FDA and has been authorized for detection and/or diagnosis of SARS-CoV-2 by FDA under an Emergency Use Authorization (EUA). This EUA will remain in effect (meaning this test can be used) for the duration of the COVID-19 declaration under Section 564(b)(1) of the Act, 21 U.S.C. section 360bbb-3(b)(1), unless the authorization is terminated or revoked.  Performed at [facility], [place] [address] [place], [place] [address]   Blood Culture ID Panel (Reflexed)     Status: None   Collection Time: 06/21/22 12:15 PM  Result Value Ref Range Status   Enterococcus faecalis NOT DETECTED NOT DETECTED Final   Enterococcus Faecium NOT DETECTED NOT DETECTED Final   Listeria monocytogenes NOT DETECTED NOT DETECTED Final   Staphylococcus species NOT DETECTED NOT DETECTED Final   Staphylococcus aureus (BCID) NOT DETECTED NOT DETECTED Final   Staphylococcus epidermidis NOT DETECTED NOT DETECTED Final   Staphylococcus lugdunensis NOT DETECTED NOT DETECTED Final   Streptococcus species NOT DETECTED NOT DETECTED Final   Streptococcus agalactiae NOT DETECTED NOT DETECTED Final   Streptococcus pneumoniae NOT DETECTED NOT DETECTED Final   Streptococcus pyogenes NOT DETECTED NOT DETECTED Final   A.calcoaceticus-baumannii NOT DETECTED NOT DETECTED Final   Bacteroides fragilis NOT DETECTED NOT DETECTED Final   Enterobacterales NOT DETECTED NOT DETECTED Final   Enterobacter cloacae complex NOT DETECTED NOT DETECTED Final   Escherichia coli NOT DETECTED NOT DETECTED Final   Klebsiella aerogenes NOT DETECTED NOT DETECTED Final   Klebsiella oxytoca NOT DETECTED NOT DETECTED Final   Klebsiella pneumoniae NOT DETECTED NOT DETECTED Final   Proteus species NOT DETECTED NOT DETECTED  Final   Salmonella species NOT DETECTED NOT DETECTED Final   Serratia marcescens NOT DETECTED NOT DETECTED Final   Haemophilus influenzae NOT DETECTED NOT DETECTED Final   Neisseria meningitidis NOT DETECTED NOT DETECTED Final   Pseudomonas aeruginosa NOT DETECTED NOT DETECTED Final   Stenotrophomonas maltophilia NOT DETECTED NOT DETECTED Final   Candida albicans NOT DETECTED NOT DETECTED Final   Candida auris NOT DETECTED NOT DETECTED Final   Candida glabrata NOT DETECTED NOT DETECTED Final   Candida krusei NOT DETECTED NOT DETECTED Final   Candida parapsilosis NOT DETECTED NOT DETECTED Final   Candida tropicalis NOT DETECTED NOT DETECTED Final   Cryptococcus neoformans/gattii NOT DETECTED NOT DETECTED Final    Comment: Performed at [facility], [address], [place], [place] [address]  MRSA Next Gen by PCR, Nasal     Status: None   Collection Time: 06/21/22  4:53 PM   Specimen: Nasal Mucosa; Nasal Swab  Result Value Ref Range Status  MRSA by PCR Next Gen NOT DETECTED NOT DETECTED Final    Comment: (NOTE) The GeneXpert MRSA Assay (FDA approved for NASAL specimens only), is one component of a comprehensive MRSA colonization surveillance program. It is not intended to diagnose MRSA infection nor to guide or monitor treatment for MRSA infections. Test performance is not FDA approved in patients less than 76 years old. Performed at [facility], [place] [address], [place], [place] [address]     RADIOLOGY STUDIES/RESULTS: ECHOCARDIOGRAM COMPLETE  Result Date: 06/23/2022    ECHOCARDIOGRAM REPORT   Patient Name:   Jeffrey Payne Date of Exam: 06/23/2022 Medical Rec #:  [id number]      Height:       67.0 in Accession #:    [id number]     Weight:       159.0 lb Date of Birth:  Dec 21, 1932      BSA:          1.834 m Patient Age:    8 years       BP:           102/42 mmHg Patient Gender: M              HR:           73 bpm. Exam Location:  Inpatient Procedure: 2D Echo, Cardiac  Doppler and Color Doppler Indications:    Elevated Troponin  History:        Patient has prior history of Echocardiogram examinations, most                 recent 02/01/2021. CHF, Arrythmias:LBBB and Bradycardia,                 Signs/Symptoms:Hypotension and Shortness of Breath; Risk                 Factors:Hypertension and Dyslipidemia.  Sonographer:    Ronny Flurry Referring Phys: [id number] [place]  1. Left ventricular ejection fraction, by estimation, is 30 to 35%. The left ventricle has moderately decreased function. The left ventricle demonstrates global hypokinesis. There is mild concentric left ventricular hypertrophy. Left ventricular diastolic parameters are indeterminate.  2. Right ventricular systolic function is moderately reduced. The right ventricular size is normal.  3. The mitral valve is normal in structure. No evidence of mitral valve regurgitation. No evidence of mitral stenosis.  4. The aortic valve is tricuspid. There is mild calcification of the aortic valve. Aortic valve regurgitation is mild. Aortic valve sclerosis/calcification is present, without any evidence of aortic stenosis.  5. The inferior vena cava is normal in size with greater than 50% respiratory variability, suggesting right atrial pressure of 3 mmHg. FINDINGS  Left Ventricle: Left ventricular ejection fraction, by estimation, is 30 to 35%. The left ventricle has moderately decreased function. The left ventricle demonstrates global hypokinesis. The left ventricular internal cavity size was normal in size. There is mild concentric left ventricular hypertrophy. Abnormal (paradoxical) septal motion, consistent with left bundle branch block. Left ventricular diastolic parameters are indeterminate. Right Ventricle: The right ventricular size is normal. No increase in right ventricular wall thickness. Right ventricular systolic function is moderately reduced. Left Atrium: Left atrial size was normal in size.  Right Atrium: Right atrial size was normal in size. Pericardium: There is no evidence of pericardial effusion. Mitral Valve: The mitral valve is normal in structure. Mild mitral annular calcification. No evidence of mitral valve regurgitation. No evidence of mitral valve stenosis. Tricuspid Valve: The tricuspid  valve is normal in structure. Tricuspid valve regurgitation is not demonstrated. No evidence of tricuspid stenosis. Aortic Valve: The aortic valve is tricuspid. There is mild calcification of the aortic valve. Aortic valve regurgitation is mild. Aortic valve sclerosis/calcification is present, without any evidence of aortic stenosis. Aortic valve mean gradient measures 3.5 mmHg. Aortic valve peak gradient measures 5.5 mmHg. Aortic valve area, by VTI measures 2.73 cm. Pulmonic Valve: The pulmonic valve was normal in structure. Pulmonic valve regurgitation is not visualized. No evidence of pulmonic stenosis. Aorta: The aortic root is normal in size and structure. Venous: The inferior vena cava is normal in size with greater than 50% respiratory variability, suggesting right atrial pressure of 3 mmHg. IAS/Shunts: No atrial level shunt detected by color flow Doppler.  LEFT VENTRICLE PLAX 2D LVIDd:         5.00 cm LVIDs:         4.10 cm LV PW:         1.10 cm LV IVS:        1.10 cm LVOT diam:     2.00 cm LV SV:         55 LV SV Index:   30 LVOT Area:     3.14 cm  RIGHT VENTRICLE         IVC TAPSE (M-mode): 1.7 cm  IVC diam: 1.50 cm LEFT ATRIUM             Index        RIGHT ATRIUM           Index LA diam:        3.70 cm 2.02 cm/m   RA Area:     13.40 cm LA Vol (A2C):   18.9 ml 10.30 ml/m  RA Volume:   30.00 ml  16.35 ml/m LA Vol (A4C):   39.2 ml 21.34 ml/m LA Biplane Vol: 26.5 ml 14.45 ml/m  AORTIC VALVE AV Area (Vmax):    2.54 cm AV Area (Vmean):   2.31 cm AV Area (VTI):     2.73 cm AV Vmax:           117.50 cm/s AV Vmean:          81.750 cm/s AV VTI:            0.202 m AV Peak Grad:      5.5 mmHg AV  Mean Grad:      3.5 mmHg LVOT Vmax:         94.90 cm/s LVOT Vmean:        60.000 cm/s LVOT VTI:          0.175 m LVOT/AV VTI ratio: 0.87  AORTA Ao Root diam: 3.50 cm Ao Asc diam:  3.60 cm  SHUNTS Systemic VTI:  0.18 m Systemic Diam: 2.00 cm Glori Bickers MD Electronically signed by Glori Bickers MD Signature Date/Time: 06/23/2022/11:28:21 AM    Final      LOS: 3 days   Oren Binet, MD  [facility]    To contact the attending provider between 7A-7P or the covering provider during after hours 7P-7A, please log into the web site [email or web address] and access using universal [place] password for that web site. If you do not have the password, please call the hospital operator.  06/24/2022, 10:54 AM

## 2022-06-24 NOTE — Progress Notes (Signed)
PT Cancellation Note  Patient Details Name: Jeffrey Payne MRN: [id number] DOB: 1932-07-16   Cancelled Treatment:    Reason Eval/Treat Not Completed: Patient at procedure or test/unavailable.  On HD, retry at another time.   Ramond Dial 06/24/2022, 12:01 PM  Mee Hives, PT PhD Acute Rehab Dept. Number: [place] and [place]

## 2022-06-24 NOTE — Progress Notes (Signed)
Pharmacist Heart Failure Core Measure Documentation  Assessment:  Jeffrey Payne has an EF documented as 30-35% on 06/23/22 by Echo.  Rationale: Heart failure patients with left ventricular systolic dysfunction (LVSD) and an EF < 40% should be prescribed an angiotensin converting enzyme inhibitor (ACEI) or angiotensin receptor blocker (ARB) at discharge unless a contraindication is documented in the medical record.  This patient is not currently on an ACEI or ARB for HF.  This note is being placed in the record in order to provide documentation that a contraindication to the use of these agents is present for this encounter.  ACE Inhibitor or Angiotensin Receptor Blocker is contraindicated (specify all that apply)  '[]'$   ACEI allergy AND ARB allergy '[]'$   Angioedema '[]'$   Moderate or severe aortic stenosis '[]'$   Hyperkalemia '[]'$   Hypotension '[]'$   Renal artery stenosis '[x]'$   Worsening renal function, preexisting renal disease or dysfunction. ESRD  Nicole Cella, RPh Clinical Pharmacist  06/24/2022 4:05 PM

## 2022-06-24 NOTE — Plan of Care (Signed)

## 2022-06-24 NOTE — Evaluation (Signed)
Occupational Therapy Evaluation Patient Details Name: Jeffrey Payne MRN: [id number] DOB: Sep 10, 1932 Today's Date: 06/24/2022   History of Present Illness Pt is an 89 year old man admitted on 2/26 with acute hypoxic respiratory failure in the setting of acute bronchitis. PMH: ESRD, CHF.   Clinical Impression   Pt lives at [place] with his wife, walks with a RW and is assisted for showering. He presents with confusion and generalized weakness. Only agreeable to sitting EOB. Pt requiring mod assist for supine to sit. Demonstrated ability to self feed at EOB with set up. Pt declined OOB to chair. Pt left seated EOB with wife, daughter and grandson, nursing notified. Family to call for help to return to supine.      Recommendations for follow up therapy are one component of a multi-disciplinary discharge planning process, led by the attending physician.  Recommendations may be updated based on patient status, additional functional criteria and insurance authorization.   Follow Up Recommendations  Skilled nursing-short term rehab (<3 hours/day)     Assistance Recommended at Discharge Frequent or constant Supervision/Assistance  Patient can return home with the following A lot of help with bathing/dressing/bathroom;Assistance with feeding;Direct supervision/assist for medications management;Direct supervision/assist for financial management;Assist for transportation;Help with stairs or ramp for entrance;Assistance with cooking/housework    Functional Status Assessment  Patient has had a recent decline in their functional status and demonstrates the ability to make significant improvements in function in a reasonable and predictable amount of time.  Equipment Recommendations  Other (comment) (TBD)    Recommendations for Other Services       Precautions / Restrictions Precautions Precautions: Fall      Mobility Bed Mobility Overal bed mobility: Needs Assistance Bed  Mobility: Supine to Sit     Supine to sit: Mod assist, HOB elevated     General bed mobility comments: assist to initiate LEs over EOB, to raise trunk and for hips to EOB using bed pad    Transfers                   General transfer comment: declined OOB      Balance Overall balance assessment: Needs assistance   Sitting balance-Leahy Scale: Fair                                     ADL either performed or assessed with clinical judgement   ADL Overall ADL's : Needs assistance/impaired Eating/Feeding: Minimal assistance;Sitting Eating/Feeding Details (indicate cue type and reason): ate sherbet on EOB Grooming: Wash/dry face;Bed level;Total assistance                                 General ADL Comments: total assist for bathing, dressing and toileting     Vision Baseline Vision/History: 1 Wears glasses Patient Visual Report: No change from baseline       Perception     Praxis      Pertinent Vitals/Pain Pain Assessment Pain Assessment: Faces Faces Pain Scale: No hurt     Hand Dominance Left   Extremity/Trunk Assessment Upper Extremity Assessment Upper Extremity Assessment: Generalized weakness   Lower Extremity Assessment Lower Extremity Assessment: Defer to PT evaluation   Cervical / Trunk Assessment Cervical / Trunk Assessment: Kyphotic   Communication Communication Communication: Expressive difficulties;Other (comment);HOH (minimally verbal)   Cognition Arousal/Alertness: Awake/alert Behavior During Therapy:  Flat affect Overall Cognitive Status: Impaired/Different from baseline Area of Impairment: Orientation, Attention, Memory, Following commands, Safety/judgement, Awareness, Problem solving                 Orientation Level: Disoriented to, Place, Time, Situation Current Attention Level: Focused Memory: Decreased short-term memory Following Commands: Follows one step commands inconsistently, Follows  one step commands with increased time Safety/Judgement: Decreased awareness of safety, Decreased awareness of deficits Awareness: Intellectual Problem Solving: Slow processing, Decreased initiation, Difficulty sequencing, Requires verbal cues, Requires tactile cues       General Comments       Exercises     Shoulder Instructions      Home Living Family/patient expects to be discharged to:: Private residence Living Arrangements: Spouse/significant other Available Help at Discharge: Family;Available 24 hours/day Type of Home: Independent living facility Home Access: Elevator;Level entry     Home Layout: One level     Bathroom Shower/Tub: Occupational psychologist: Standard     Home Equipment: Conservation officer, nature (2 wheels);Shower seat;Grab bars - toilet;Grab bars - tub/shower          Prior Functioning/Environment Prior Level of Function : Independent/Modified Independent             Mobility Comments: walks with RW ADLs Comments: assisted for shower transfer and to wash his back        OT Problem List: Decreased strength;Decreased activity tolerance;Decreased cognition      OT Treatment/Interventions: Self-care/ADL training;DME and/or AE instruction;Therapeutic activities;Cognitive remediation/compensation;Patient/family education;Balance training    OT Goals(Current goals can be found in the care plan section) Acute Rehab OT Goals OT Goal Formulation: With family Time For Goal Achievement: 07/08/22 Potential to Achieve Goals: Good ADL Goals Pt Will Perform Grooming: with min assist;standing Pt Will Perform Upper Body Dressing: with min assist;sitting Pt Will Transfer to Toilet: with min assist;ambulating;bedside commode Additional ADL Goal #1: Pt will perfrom bed mobility with min assist in preparation for ADLs. Additional ADL Goal #2: Pt will use environmental cues to respond accurately to place and time.  OT Frequency: Min 2X/week     Co-evaluation              AM-PAC OT "6 Clicks" Daily Activity     Outcome Measure Help from another person eating meals?: A Little Help from another person taking care of personal grooming?: Total Help from another person toileting, which includes using toliet, bedpan, or urinal?: Total Help from another person bathing (including washing, rinsing, drying)?: Total Help from another person to put on and taking off regular upper body clothing?: Total Help from another person to put on and taking off regular lower body clothing?: Total 6 Click Score: 8   End of Session Nurse Communication: Other (comment) (pt with confusion)  Activity Tolerance: Other (comment) (limited by cognition) Patient left: in bed;with call bell/phone within reach;with family/visitor present  OT Visit Diagnosis: Muscle weakness (generalized) (M62.81);Other symptoms and signs involving cognitive function                Time: 1400-1427 OT Time Calculation (min): 27 min Charges:  OT General Charges $OT Visit: 1 Visit OT Evaluation $OT Eval Moderate Complexity: 1 Mod OT Treatments $Self Care/Home Management : 8-22 mins  Cleta Alberts, OTR/L Acute Rehabilitation Services Office: [phone]   Malka So 06/24/2022, 2:42 PM

## 2022-06-24 NOTE — Progress Notes (Signed)
OT Cancellation Note  Patient Details Name: Jeffrey Payne MRN: [id number] DOB: 1932-07-14   Cancelled Treatment:    Reason Eval/Treat Not Completed: Patient at procedure or test/ unavailable (HD)  Malka So 06/24/2022, 11:20 AM Cleta Alberts, OTR/L Acute Rehabilitation Services Office: [phone]

## 2022-06-24 NOTE — Progress Notes (Addendum)
Received patient in bed to unit.  Alert and oriented.  Informed consent signed and in chart.   Rennert duration:1h 68mn  Patient tolerated well. Dialysis circulation clotted off and pt opted to end tx rather than restart. Transported back to the room  Alert, without acute distress.  Hand-off given to patient's nurse.   Access used: AVF Access issues: none  Total UF removed: 0.3L Medication(s) given: none Post HD VS: 139/53,82,97.9,95%,22 Post HD weight: 51.4kg   [facility]

## 2022-06-25 DIAGNOSIS — N186 End stage renal disease: Secondary | ICD-10-CM | POA: Diagnosis not present

## 2022-06-25 DIAGNOSIS — I10 Essential (primary) hypertension: Secondary | ICD-10-CM | POA: Diagnosis not present

## 2022-06-25 DIAGNOSIS — J9601 Acute respiratory failure with hypoxia: Secondary | ICD-10-CM | POA: Diagnosis not present

## 2022-06-25 DIAGNOSIS — A419 Sepsis, unspecified organism: Secondary | ICD-10-CM | POA: Diagnosis not present

## 2022-06-25 LAB — CBC
HCT: 38.3 % — ABNORMAL LOW (ref 39.0–52.0)
Hemoglobin: 12.7 g/dL — ABNORMAL LOW (ref 13.0–17.0)
MCH: 32.9 pg (ref 26.0–34.0)
MCHC: 33.2 g/dL (ref 30.0–36.0)
MCV: 99.2 fL (ref 80.0–100.0)
Platelets: 178 10*3/uL (ref 150–400)
RBC: 3.86 MIL/uL — ABNORMAL LOW (ref 4.22–5.81)
RDW: 15.4 % (ref 11.5–15.5)
WBC: 10.9 10*3/uL — ABNORMAL HIGH (ref 4.0–10.5)
nRBC: 0 % (ref 0.0–0.2)

## 2022-06-25 LAB — RENAL FUNCTION PANEL
Albumin: 2.4 g/dL — ABNORMAL LOW (ref 3.5–5.0)
Anion gap: 10 (ref 5–15)
BUN: 45 mg/dL — ABNORMAL HIGH (ref 8–23)
CO2: 30 mmol/L (ref 22–32)
Calcium: 9.2 mg/dL (ref 8.9–10.3)
Chloride: 97 mmol/L — ABNORMAL LOW (ref 98–111)
Creatinine, Ser: 4.88 mg/dL — ABNORMAL HIGH (ref 0.61–1.24)
GFR, Estimated: 11 mL/min — ABNORMAL LOW (ref >60)
Glucose, Bld: 105 mg/dL — ABNORMAL HIGH (ref 70–99)
Phosphorus: 2.9 mg/dL (ref 2.5–4.6)
Potassium: 3.7 mmol/L (ref 3.5–5.1)
Sodium: 137 mmol/L (ref 135–145)

## 2022-06-25 NOTE — TOC Progression Note (Addendum)
Transition of Care [facility]) - Progression Note    Patient Details  Name: Jeffrey Payne MRN: [id number] Date of Birth: [birth year]  [facility] of [facility]) CM/SW [place], RN Phone Number: 06/25/2022, 11:58 AM  Clinical Narrative:    Readmission high at 26% interventions as follows: Patient to potentially DC tomorrow. Wife spoke to [place] and declined SNF. She would like PT and OT through [facility]. She would also like a wheelchair to make it easier getting into dialysis. Called [place] at [employer] for wheelchair.  Called Regina at [facility] and left a message regarding [place], will fax orders to her at [phone]. [phone] orders placed for home health faxed to [facility]   Expected Discharge Plan: [place] Barriers to Discharge: Continued Medical Work up  Expected Discharge Plan and Services In-house Referral: Clinical Social Work Discharge Planning Services: CM Consult Post Acute Care Choice: Home Health, Dialysis, Durable Medical Equipment Living arrangements for the past 2 months: [place]                 DME Arranged: Youth worker wheelchair with seat cushion DME Agency: [employer] Date DME Agency Contacted: 06/25/22 Time DME Agency Contacted: 1157 Representative spoke with at DME Agency: [place]: PT, OT [place] Agency:  Secondary school teacher in conjunction with [employer]) Date [place]: 06/25/22 Time [place]: 1158 Representative spoke with at [place]: [place] ([place]) Interventions SDOH Screenings   Food Insecurity: No Food Insecurity (06/15/2022)  Housing: Low Risk  (06/15/2022)  Transportation Needs: No Transportation Needs (06/15/2022)  Utilities: Not At Risk (06/15/2022)  Tobacco Use: Low Risk  (06/21/2022)    Readmission Risk Interventions     No data to display

## 2022-06-25 NOTE — Progress Notes (Signed)
PROGRESS NOTE        PATIENT DETAILS Name: Jeffrey Payne Age: 89 y.o. Sex: male Date of Birth: 1932-12-04 Admit Date: 06/21/2022 Admitting Physician Rhetta Mura, DO [id number], Christoper Fabian, MD  Brief Summary: Patient is a 89 y.o.  male w hx of chronic HFpEF/HFrEF, ESRD on HD TTS who presented to the hospital with acute hypoxic respiratory failure in the setting of acute bronchitis due to Devol virus.  Significant events: 2/19-2/20>> hospitalization for hypoxia from decompensated heart failure/Metapneumonia virus. 2/26>> admit to [facility]  Significant studies: 2/26>> CTA chest: No PE/no obvious PNA 2/26>> CT head: No acute intracranial process. 2/28>> echo: EF [id number] RV systolic function moderately reduced.  Significant microbiology data: 2/19>> respiratory virus panel:+ve Metapneumonia virus 2/26>> COVID/influenza/RSV PCR: Negative 2/26>> blood culture: Aerococcus Viridans 2/29>> blood culture: No growth  Procedures: None  Consults: Renal  Subjective: No major issues overnight.  More awake/alert compared to yesterday.  Looks very frail.  Objective: Vitals: Blood pressure 123/64, pulse 95, temperature 97.9 F (36.6 C), temperature source Oral, resp. rate (!) 22, height '5\' 7"'$  (1.702 m), weight 53.2 kg, SpO2 90 %.   Gen Exam:not in any distress HEENT:atraumatic, normocephalic Chest: B/L clear to auscultation anteriorly CVS:S1S2 regular Abdomen:soft non tender, non distended Extremities:no edema Neurology: Non focal Skin: no rash  Pertinent Labs/Radiology:    Latest Ref Rng & Units 06/25/2022    7:51 AM 06/24/2022    4:33 AM 06/23/2022   11:25 AM  CBC  WBC 4.0 - 10.5 K/uL 10.9  4.5  17.5   Hemoglobin 13.0 - 17.0 g/dL 12.7  13.0  12.8   Hematocrit 39.0 - 52.0 % 38.3  38.0  38.5   Platelets 150 - 400 K/uL 178  202  193     Lab Results  Component Value Date   NA 137 06/25/2022   K 3.7 06/25/2022   CL 97 (L) 06/25/2022   CO2 30  06/25/2022      Assessment/Plan: Severe sepsis  Acute hypoxic respiratory failure due to bronchitis in the setting of recent Meta pneumonia virus Sepsis physiology has resolved Culture data as above Room air this morning Continue bronchodilators and other supportive care Mobilize/pulmonary tolerating is much as possible All antibiotics were discontinued-2/29-Aerococcus viridans and blood culture is considered to be a contaminant.  Delirium Slightly confused on 2/28 Remains awake/alert this morning.  ESRD on HD TTS Nephrology following  Chronic HFrEF Volume status stable Volume removal with HD  HTN Tolerating low-dose beta-blocker Remains on midodrine.    Hx of PAF/MAT/SVT Continue low-dose beta-blocker Reveiwed prior cards note-not a candidate for anticoag  Debility/deconditioning Due to acute illness PT/OT eval recommending SNF-however spouse indicates that she will take him back to independent living with maximal home health services.  They had a prior bad experience at a local SNF   BMI: Estimated body mass index is 18.37 kg/m as calculated from the following:   Height as of this encounter: '5\' 7"'$  (1.702 m).   Weight as of this encounter: 53.2 kg.   Code status:   Code Status: DNR (Addendum:d/w spouse at bedside, she was to continue with treatment-but does not want CPR/Intubation-she is agreeable w a DNR order)  DVT Prophylaxis: SCDs Start: 06/21/22 1945   Family Communication: Spouse at bedside on 2/29   Disposition Plan: Status is: Inpatient Remains inpatient appropriate because: Severity of  illness   Planned Discharge Destination:Home health    Diet: Diet Order             Diet renal with fluid restriction Room service appropriate? No; Fluid consistency: Thin  Diet effective now                     Antimicrobial agents: Anti-infectives (From admission, onward)    Start     Dose/Rate Route Frequency Ordered Stop   06/23/22 1200   vancomycin (VANCOREADY) IVPB 750 mg/150 mL  Status:  Discontinued        750 mg 150 mL/hr over 60 Minutes Intravenous  Once 06/23/22 1112 06/23/22 1124   06/22/22 2200  ceFEPIme (MAXIPIME) 1 g in sodium chloride 0.9 % 100 mL IVPB  Status:  Discontinued        1 g 200 mL/hr over 30 Minutes Intravenous Every 24 hours 06/21/22 1656 06/22/22 0942   06/22/22 1800  vancomycin (VANCOREADY) IVPB 750 mg/150 mL  Status:  Discontinued        750 mg 150 mL/hr over 60 Minutes Intravenous Every T-Th-Sa (1800) 06/22/22 1003 06/24/22 1106   06/21/22 1445  ceFEPIme (MAXIPIME) 1 g in sodium chloride 0.9 % 100 mL IVPB        1 g 200 mL/hr over 30 Minutes Intravenous  Once 06/21/22 1432 06/21/22 1524   06/21/22 1445  vancomycin (VANCOREADY) IVPB 1500 mg/300 mL        1,500 mg 150 mL/hr over 120 Minutes Intravenous  Once 06/21/22 1432 06/21/22 1734   06/21/22 1435  vancomycin variable dose per unstable renal function (pharmacist dosing)  Status:  Discontinued         Does not apply See admin instructions 06/21/22 1435 06/22/22 1003   06/21/22 1430  ceFEPIme (MAXIPIME) 1 g in sodium chloride 0.9 % 100 mL IVPB  Status:  Discontinued        1 g 200 mL/hr over 30 Minutes Intravenous  Once 06/21/22 1416 06/21/22 1432   06/21/22 1430  metroNIDAZOLE (FLAGYL) IVPB 500 mg        500 mg 100 mL/hr over 60 Minutes Intravenous  Once 06/21/22 1416 06/21/22 1636   06/21/22 1430  vancomycin (VANCOCIN) IVPB 1000 mg/200 mL premix  Status:  Discontinued        1,000 mg 200 mL/hr over 60 Minutes Intravenous  Once 06/21/22 1416 06/21/22 1432        MEDICATIONS: Scheduled Meds:  calcitRIOL  0.5 mcg Oral Q T,Th,Sa-HD   calcium acetate  667 mg Oral TID WC   Chlorhexidine Gluconate Cloth  6 each Topical Q0600   feeding supplement (NEPRO CARB STEADY)  237 mL Oral TID BM   loratadine  10 mg Oral Daily   metoprolol tartrate  12.5 mg Oral BID   midodrine  10 mg Oral TID WC   tamsulosin  0.4 mg Oral Daily   Continuous  Infusions:   PRN Meds:.acetaminophen **OR** acetaminophen, albuterol, ALPRAZolam, benzonatate, HYDROcodone-acetaminophen, ipratropium-albuterol, melatonin   I have personally reviewed following labs and imaging studies  LABORATORY DATA: CBC: Recent Labs  Lab 06/21/22 1215 06/22/22 0340 06/22/22 0351 06/23/22 0254 06/23/22 1125 06/24/22 0433 06/25/22 0751  WBC 24.4* 33.2*  --  19.5* 17.5* 4.5 10.9*  NEUTROABS 22.7* 31.2*  --   --  14.7*  --   --   HGB 14.7 13.0 13.9 12.5* 12.8* 13.0 12.7*  HCT 44.5 40.0 41.0 37.4* 38.5* 38.0* 38.3*  MCV 102.5* 102.6*  --  98.7 99.7 99.0 99.2  PLT 244 227  --  197 193 202 178     Basic Metabolic Panel: Recent Labs  Lab 06/21/22 1215 06/21/22 2245 06/22/22 0340 06/22/22 0351 06/23/22 0254 06/24/22 0433 06/25/22 0751  NA 141  --  137 133* 136 137 137  K 3.6  --  4.3 5.6* 4.2 4.0 3.7  CL 96*  --  92*  --  96* 97* 97*  CO2 24  --  22  --  '25 23 30  '$ GLUCOSE 74  --  86  --  82 97 105*  BUN 28*  --  39*  --  42* 60* 45*  CREATININE 5.60*  --  6.16*  --  5.69* 6.18* 4.88*  CALCIUM 9.1  --  8.5*  --  8.8* 9.1 9.2  MG  --  1.9 1.7  --   --   --   --   PHOS  --   --  5.2*  --  6.1* 4.8* 2.9     GFR: Estimated Creatinine Clearance: 8 mL/min (A) (by C-G formula based on SCr of 4.88 mg/dL (H)).  Liver Function Tests: Recent Labs  Lab 06/21/22 1215 06/22/22 0340 06/23/22 0254 06/24/22 0433 06/25/22 0751  AST 16 15  --   --   --   ALT 9 10  --   --   --   ALKPHOS 112 102  --   --   --   BILITOT 1.9* 1.8*  --   --   --   PROT 6.6 6.2*  --   --   --   ALBUMIN 3.0* 2.6* 2.3* 2.4* 2.4*    No results for input(s): "LIPASE", "AMYLASE" in the last 168 hours. Recent Labs  Lab 06/22/22 0340  AMMONIA 33     Coagulation Profile: Recent Labs  Lab 06/22/22 0340  INR 1.2     Cardiac Enzymes: No results for input(s): "CKTOTAL", "CKMB", "CKMBINDEX", "TROPONINI" in the last 168 hours.  BNP (last 3 results) No results for  input(s): "PROBNP" in the last 8760 hours.  Lipid Profile: No results for input(s): "CHOL", "HDL", "LDLCALC", "TRIG", "CHOLHDL", "LDLDIRECT" in the last 72 hours.  Thyroid Function Tests: No results for input(s): "TSH", "T4TOTAL", "FREET4", "T3FREE", "THYROIDAB" in the last 72 hours.   Anemia Panel: No results for input(s): "VITAMINB12", "FOLATE", "FERRITIN", "TIBC", "IRON", "RETICCTPCT" in the last 72 hours.   Urine analysis:    Component Value Date/Time   COLORURINE YELLOW 04/06/2022 1554   APPEARANCEUR CLEAR 04/06/2022 1554   LABSPEC 1.010 04/06/2022 1554   PHURINE 7.5 04/06/2022 1554   GLUCOSEU NEGATIVE 04/06/2022 1554   HGBUR TRACE (A) 04/06/2022 1554   BILIRUBINUR NEGATIVE 04/06/2022 1554   KETONESUR NEGATIVE 04/06/2022 1554   PROTEINUR 100 (A) 04/06/2022 1554   NITRITE NEGATIVE 04/06/2022 1554   LEUKOCYTESUR NEGATIVE 04/06/2022 1554    Sepsis Labs: Lactic Acid, Venous    Component Value Date/Time   LATICACIDVEN 1.1 06/21/2022 1508    MICROBIOLOGY: Recent Results (from the past 240 hour(s))  Blood Culture (routine x 2)     Status: Abnormal   Collection Time: 06/21/22 12:15 PM   Specimen: BLOOD  Result Value Ref Range Status   Specimen Description BLOOD LEFT ANTECUBITAL  Final   Special Requests   Final    BOTTLES DRAWN AEROBIC AND ANAEROBIC Blood Culture adequate volume   Culture  Setup Time   Final    GRAM POSITIVE COCCI IN BOTH AEROBIC AND ANAEROBIC BOTTLES  Organism ID to follow CRITICAL RESULT CALLED TO, READ BACK BY AND VERIFIED WITH: Ezekiel Slocumb PHARMD, AT [id number] 06/22/22 D. VANHOOK    Culture (A)  Final    AEROCOCCUS VIRIDANS Standardized susceptibility testing for this organism is not available. Performed at [facility], [place] [address], [place], [place] [address]    Report Status 06/24/2022 FINAL  Final  Resp panel by RT-PCR (RSV, Flu A&B, Covid) Anterior Nasal Swab     Status: None   Collection Time: 06/21/22 12:15 PM   Specimen: Anterior  Nasal Swab  Result Value Ref Range Status   SARS Coronavirus 2 by RT PCR NEGATIVE NEGATIVE Final   Influenza A by PCR NEGATIVE NEGATIVE Final   Influenza B by PCR NEGATIVE NEGATIVE Final    Comment: (NOTE) The Xpert Xpress SARS-CoV-2/FLU/RSV plus assay is intended as an aid in the diagnosis of influenza from Nasopharyngeal swab specimens and should not be used as a sole basis for treatment. Nasal washings and aspirates are unacceptable for Xpert Xpress SARS-CoV-2/FLU/RSV testing.  Fact Sheet for Patients: EntrepreneurPulse.com.au  Fact Sheet for Healthcare Providers: IncredibleEmployment.be  This test is not yet approved or cleared by the [place] FDA and has been authorized for detection and/or diagnosis of SARS-CoV-2 by FDA under an Emergency Use Authorization (EUA). This EUA will remain in effect (meaning this test can be used) for the duration of the COVID-19 declaration under Section 564(b)(1) of the Act, 21 U.S.C. section 360bbb-3(b)(1), unless the authorization is terminated or revoked.     Resp Syncytial Virus by PCR NEGATIVE NEGATIVE Final    Comment: (NOTE) Fact Sheet for Patients: EntrepreneurPulse.com.au  Fact Sheet for Healthcare Providers: IncredibleEmployment.be  This test is not yet approved or cleared by the [place] FDA and has been authorized for detection and/or diagnosis of SARS-CoV-2 by FDA under an Emergency Use Authorization (EUA). This EUA will remain in effect (meaning this test can be used) for the duration of the COVID-19 declaration under Section 564(b)(1) of the Act, 21 U.S.C. section 360bbb-3(b)(1), unless the authorization is terminated or revoked.  Performed at [facility], [place] [address] [place], [place] [address]   Blood Culture ID Panel (Reflexed)     Status: None   Collection Time: 06/21/22 12:15 PM  Result Value Ref Range Status   Enterococcus  faecalis NOT DETECTED NOT DETECTED Final   Enterococcus Faecium NOT DETECTED NOT DETECTED Final   Listeria monocytogenes NOT DETECTED NOT DETECTED Final   Staphylococcus species NOT DETECTED NOT DETECTED Final   Staphylococcus aureus (BCID) NOT DETECTED NOT DETECTED Final   Staphylococcus epidermidis NOT DETECTED NOT DETECTED Final   Staphylococcus lugdunensis NOT DETECTED NOT DETECTED Final   Streptococcus species NOT DETECTED NOT DETECTED Final   Streptococcus agalactiae NOT DETECTED NOT DETECTED Final   Streptococcus pneumoniae NOT DETECTED NOT DETECTED Final   Streptococcus pyogenes NOT DETECTED NOT DETECTED Final   A.calcoaceticus-baumannii NOT DETECTED NOT DETECTED Final   Bacteroides fragilis NOT DETECTED NOT DETECTED Final   Enterobacterales NOT DETECTED NOT DETECTED Final   Enterobacter cloacae complex NOT DETECTED NOT DETECTED Final   Escherichia coli NOT DETECTED NOT DETECTED Final   Klebsiella aerogenes NOT DETECTED NOT DETECTED Final   Klebsiella oxytoca NOT DETECTED NOT DETECTED Final   Klebsiella pneumoniae NOT DETECTED NOT DETECTED Final   Proteus species NOT DETECTED NOT DETECTED Final   Salmonella species NOT DETECTED NOT DETECTED Final   Serratia marcescens NOT DETECTED NOT DETECTED Final   Haemophilus influenzae  NOT DETECTED NOT DETECTED Final   Neisseria meningitidis NOT DETECTED NOT DETECTED Final   Pseudomonas aeruginosa NOT DETECTED NOT DETECTED Final   Stenotrophomonas maltophilia NOT DETECTED NOT DETECTED Final   Candida albicans NOT DETECTED NOT DETECTED Final   Candida auris NOT DETECTED NOT DETECTED Final   Candida glabrata NOT DETECTED NOT DETECTED Final   Candida krusei NOT DETECTED NOT DETECTED Final   Candida parapsilosis NOT DETECTED NOT DETECTED Final   Candida tropicalis NOT DETECTED NOT DETECTED Final   Cryptococcus neoformans/gattii NOT DETECTED NOT DETECTED Final    Comment: Performed at [facility], [place] [address], [place], [place]  [address]  MRSA Next Gen by PCR, Nasal     Status: None   Collection Time: 06/21/22  4:53 PM   Specimen: Nasal Mucosa; Nasal Swab  Result Value Ref Range Status   MRSA by PCR Next Gen NOT DETECTED NOT DETECTED Final    Comment: (NOTE) The GeneXpert MRSA Assay (FDA approved for NASAL specimens only), is one component of a comprehensive MRSA colonization surveillance program. It is not intended to diagnose MRSA infection nor to guide or monitor treatment for MRSA infections. Test performance is not FDA approved in patients less than 62 years old. Performed at [facility], [place] [address] [place], [place] [address]   Culture, blood (Routine X 2) w Reflex to ID Panel     Status: None (Preliminary result)   Collection Time: 06/24/22 12:21 PM   Specimen: BLOOD LEFT HAND  Result Value Ref Range Status   Specimen Description BLOOD LEFT HAND  Final   Special Requests   Final    BOTTLES DRAWN AEROBIC ONLY Blood Culture adequate volume   Culture   Final    NO GROWTH < 24 HOURS Performed at [facility], [place] [address], [place], [place] [address]    Report Status PENDING  Incomplete  Culture, blood (Routine X 2) w Reflex to ID Panel     Status: None (Preliminary result)   Collection Time: 06/24/22 12:30 PM   Specimen: BLOOD LEFT HAND  Result Value Ref Range Status   Specimen Description BLOOD LEFT HAND  Final   Special Requests   Final    BOTTLES DRAWN AEROBIC AND ANAEROBIC Blood Culture adequate volume   Culture   Final    NO GROWTH < 24 HOURS Performed at [facility], [place] [address], [place], [place] [address]    Report Status PENDING  Incomplete    RADIOLOGY STUDIES/RESULTS: No results found.   LOS: 4 days   Oren Binet, MD  [facility]    To contact the attending provider between 7A-7P or the covering provider during after hours 7P-7A, please log into the web site [email or web address] and access using universal [place] password for that web site. If  you do not have the password, please call the hospital operator.  06/25/2022, 11:06 AM

## 2022-06-25 NOTE — Care Management (Signed)
    Durable Medical Equipment  (From admission, onward)           Start     Ordered   06/25/22 1155  For home use only DME lightweight manual wheelchair with seat cushion  Once       Comments: Patient suffers from Weakness renal failure which impairs their ability to perform daily activities like toileting in the home.  A walker will not resolve  issue with performing activities of daily living. A wheelchair will allow patient to safely perform daily activities. Patient is not able to propel themselves in the home using a standard weight wheelchair due to general weakness. Patient can self propel in the lightweight wheelchair. Length of need Lifetime. Accessories: elevating leg rests (ELRs), wheel locks, extensions and anti-tippers.   06/25/22 1155

## 2022-06-25 NOTE — Plan of Care (Signed)
  Problem: Education: Goal: Knowledge of General Education information will improve Description: Including pain rating scale, medication(s)/side effects and non-pharmacologic comfort measures Outcome: Progressing   Problem: Clinical Measurements: Goal: Respiratory complications will improve Outcome: Progressing Goal: Cardiovascular complication will be avoided Outcome: Progressing   Problem: Activity: Goal: Risk for activity intolerance will decrease Outcome: Progressing   Problem: Coping: Goal: Level of anxiety will decrease Outcome: Progressing

## 2022-06-25 NOTE — Progress Notes (Signed)
Physical Therapy Evaluation Patient Details Name: Jeffrey Payne MRN: [id number] DOB: 12-08-1932 Today's Date: 06/25/2022  History of Present Illness  Pt is an 89 year old man admitted on 2/26 with acute hypoxic respiratory failure in the setting of acute bronchitis. PMH: ESRD, CHF.  Clinical Impression  Pt was seen for mobility on side of bed to stand and walk in his room.  Pt is getting up to walk with room air support, and has moments of lower sats down to 88% then up to 97% at times.  Reported all findings of aerobic issues to MD for consideration.  Pt is attended by his wife who is unsure of his physical needs at home and expressing concern for being able to manage him.  Have recommended SNF but have a 3x per week frequency in case she is unwilling to take him for a short rehab stay.  Follow acutely for goals of PT as are outlined below.       Recommendations for follow up therapy are one component of a multi-disciplinary discharge planning process, led by the attending physician.  Recommendations may be updated based on patient status, additional functional criteria and insurance authorization.  Follow Up Recommendations Skilled nursing-short term rehab (<3 hours/day) Can patient physically be transported by private vehicle: No    Assistance Recommended at Discharge Frequent or constant Supervision/Assistance  Patient can return home with the following  A lot of help with walking and/or transfers;A little help with bathing/dressing/bathroom;Direct supervision/assist for medications management;Direct supervision/assist for financial management;Assist for transportation;Help with stairs or ramp for entrance    Equipment Recommendations None recommended by PT  Recommendations for Other Services       Functional Status Assessment Patient has had a recent decline in their functional status and demonstrates the ability to make significant improvements in function in a reasonable and  predictable amount of time.     Precautions / Restrictions Precautions Precautions: Fall Precaution Comments: monitor sats Restrictions Weight Bearing Restrictions: No      Mobility  Bed Mobility Overal bed mobility: Needs Assistance Bed Mobility: Supine to Sit     Supine to sit: Mod assist, HOB elevated     General bed mobility comments: pt initiated moving legs then stagnated effort, required mod assist to lift trunk and slide over    Transfers Overall transfer level: Needs assistance Equipment used: Rolling walker (2 wheels), 1 person hand held assist Transfers: Sit to/from Stand Sit to Stand: Min assist                Ambulation/Gait Ambulation/Gait assistance: Min guard, Min assist Gait Distance (Feet): 20 Feet Assistive device: Rolling walker (2 wheels), 1 person hand held assist Gait Pattern/deviations: Step-through pattern, Decreased stride length, Trunk flexed, Narrow base of support, Shuffle Gait velocity: reduced Gait velocity interpretation: <1.31 ft/sec, indicative of household ambulator Pre-gait activities: standing balance ck General Gait Details: pt is taking small steps and progressively more SOB with walk, although sats were 88-97% on room air  Stairs            Wheelchair Mobility    Modified Rankin (Stroke Patients Only)       Balance Overall balance assessment: Needs assistance Sitting-balance support: Feet supported Sitting balance-Leahy Scale: Fair     Standing balance support: Bilateral upper extremity supported, During functional activity Standing balance-Leahy Scale: Poor  Pertinent Vitals/Pain Pain Assessment Pain Assessment: Faces Faces Pain Scale: No hurt    Home Living Family/patient expects to be discharged to:: Private residence Living Arrangements: Spouse/significant other Available Help at Discharge: Family;Available 24 hours/day Type of Home: Independent living  facility Home Access: Elevator;Level entry       Home Layout: One level Home Equipment: Conservation officer, nature (2 wheels);Shower seat;Grab bars - toilet;Grab bars - tub/shower Additional Comments: wife reports pt uses walker to go everywhere    Prior Function Prior Level of Function : Independent/Modified Independent             Mobility Comments: walks with RW ADLs Comments: wife assists dressing and bathing     Hand Dominance   Dominant Hand: Left    Extremity/Trunk Assessment   Upper Extremity Assessment Upper Extremity Assessment: Defer to OT evaluation    Lower Extremity Assessment Lower Extremity Assessment: Generalized weakness    Cervical / Trunk Assessment Cervical / Trunk Assessment: Kyphotic  Communication   Communication: Expressive difficulties;HOH  Cognition Arousal/Alertness: Awake/alert Behavior During Therapy: Flat affect Overall Cognitive Status: Impaired/Different from baseline Area of Impairment: Orientation, Attention, Following commands, Safety/judgement, Awareness, Problem solving                 Orientation Level: Situation, Time Current Attention Level: Selective Memory: Decreased recall of precautions, Decreased short-term memory Following Commands: Follows one step commands with increased time, Follows one step commands inconsistently Safety/Judgement: Decreased awareness of deficits, Decreased awareness of safety Awareness: Intellectual Problem Solving: Slow processing, Requires verbal cues, Requires tactile cues General Comments: tends to get too close to stationary objects,        General Comments General comments (skin integrity, edema, etc.): pt is SOB and fluctuating sats on room air to walk around the bed to recliner, but is motivated to try.  Recommended him to SNF but per MD family is not interested in considering it    Exercises     Assessment/Plan    PT Assessment Patient needs continued PT services  PT Problem List  Decreased strength;Decreased activity tolerance;Decreased balance;Decreased mobility;Decreased coordination;Decreased knowledge of use of DME;Decreased safety awareness;Cardiopulmonary status limiting activity       PT Treatment Interventions DME instruction;Gait training;Functional mobility training;Therapeutic activities;Therapeutic exercise;Balance training;Neuromuscular re-education;Patient/family education    PT Goals (Current goals can be found in the Care Plan section)  Acute Rehab PT Goals Patient Stated Goal: not stated PT Goal Formulation: With patient/family Time For Goal Achievement: 07/09/22 Potential to Achieve Goals: Good    Frequency Min 3X/week     Co-evaluation               AM-PAC PT "6 Clicks" Mobility  Outcome Measure Help needed turning from your back to your side while in a flat bed without using bedrails?: A Lot Help needed moving from lying on your back to sitting on the side of a flat bed without using bedrails?: A Lot Help needed moving to and from a bed to a chair (including a wheelchair)?: A Lot Help needed standing up from a chair using your arms (e.g., wheelchair or bedside chair)?: A Little Help needed to walk in hospital room?: A Little Help needed climbing 3-5 steps with a railing? : Total 6 Click Score: 13    End of Session Equipment Utilized During Treatment: Gait belt Activity Tolerance: Patient limited by fatigue;Treatment limited secondary to medical complications (Comment) Patient left: in chair;with call bell/phone within reach;with chair alarm set;with family/visitor present Nurse Communication: Mobility status;Other (comment) (  sats with effort) PT Visit Diagnosis: Unsteadiness on feet (R26.81);Muscle weakness (generalized) (M62.81);Difficulty in walking, not elsewhere classified (R26.2)    Time: 1041-1106 PT Time Calculation (min) (ACUTE ONLY): 25 min   Charges:   PT Evaluation $PT Eval Moderate Complexity: 1 Mod PT  Treatments $Therapeutic Activity: 8-22 mins       Ramond Dial 06/25/2022, 11:35 AM  Mee Hives, PT PhD Acute Rehab Dept. Number: [place] and [place]

## 2022-06-25 NOTE — NC FL2 (Signed)
[place] LEVEL OF CARE FORM     IDENTIFICATION  Patient Name: Jeffrey Payne Birthdate: [birth year] Sex: male Admission Date (Current Location): 06/21/2022  [facility] and [place] Number:  Herbalist and Address:  [facility] [place]. [facility], [place] [address], [place], [place] [address]      Provider Number: [id number]  Attending Physician Name and Address:  Jonetta Osgood, MD  Relative Name and Phone Number:       Current Level of Care: Hospital Recommended Level of Care: [place] Prior Approval Number:    Date Approved/Denied:   PASRR Number: [id number]  Discharge Plan: SNF    Current Diagnoses: Patient Active Problem List   Diagnosis Date Noted   Acute hypoxic respiratory failure ([place]) 06/21/2022   Acute bronchitis 06/21/2022   Shortness of breath 0000000   Acute metabolic encephalopathy 0000000   Severe sepsis ([place]) 06/21/2022   Allergic rhinitis 06/21/2022   Cough 06/15/2022   Elevated troponin 02/02/2022   End-stage renal disease on hemodialysis ([place]) 02/02/2022   History of COVID-19 02/02/2022   Protein-calorie malnutrition, severe 02/11/2021   Ileus, unspecified ([place]) 02/02/2021   Chronic combined systolic and diastolic heart failure ([place]) 02/01/2021   Ear stuffiness, right 01/31/2021   Closed fracture of neck of left femur ([place]) 01/30/2021   Scalp laceration 01/30/2021   Multifocal atrial tachycardia 01/30/2021   LBBB (left bundle branch block) 01/30/2021   Fall at home, initial encounter 01/29/2021   BPH with urinary obstruction 01/29/2021   History of anemia due to chronic kidney disease 01/29/2021   Leukocytosis 01/29/2021   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2 kg) Height:  '5\' 7"'$  (170.2 cm)  BEHAVIORAL SYMPTOMS/MOOD NEUROLOGICAL BOWEL NUTRITION STATUS      Continent Diet (See DC Summary)  AMBULATORY STATUS COMMUNICATION OF NEEDS Skin   Extensive Assist Verbally Normal                       Personal Care Assistance Level of Assistance  Bathing, Feeding, Dressing Bathing Assistance: Maximum assistance Feeding assistance: Limited assistance Dressing Assistance: Maximum assistance     Functional Limitations Info  Hearing   Hearing Info: Impaired      SPECIAL CARE FACTORS FREQUENCY  PT (By licensed PT), OT (By licensed OT)     PT Frequency: Eval and treat OT Frequency: Eval and treat            Contractures Contractures Info: Not present    Additional Factors Info  Code Status, Allergies Code Status Info: DNR Allergies Info: NKA           Current Medications (06/25/2022):  This is the current hospital active medication list Current Facility-Administered Medications  Medication Dose Route Frequency Provider Last Rate Last Admin   acetaminophen (TYLENOL) tablet 650 mg  650 mg Oral Q6H PRN Howerter, Justin B, DO  Or   acetaminophen (TYLENOL) suppository 650 mg  650 mg Rectal Q6H PRN Howerter, Justin B, DO       albuterol (PROVENTIL) (2.5 MG/3ML) 0.083% nebulizer solution 2.5 mg  2.5 mg Nebulization Q4H PRN Howerter, Justin B, DO       ALPRAZolam Duanne Moron) tablet 0.25 mg  0.25 mg Oral BID PRN Elgergawy, Silver Huguenin, MD   0.25 mg at 06/24/22 2139   benzonatate (TESSALON) capsule 200 mg  200 mg Oral TID PRN Howerter, Justin B, DO       calcitRIOL  (ROCALTROL) capsule 0.5 mcg  0.5 mcg Oral Q T,Th,Sa-HD Gean Quint, MD   0.5 mcg at 06/24/22 1228   calcium acetate (PHOSLO) capsule 667 mg  667 mg Oral TID WC Gean Quint, MD   667 mg at 06/25/22 0824   Chlorhexidine Gluconate Cloth 2 % PADS 6 each  6 each Topical Q0600 Gean Quint, MD   6 each at 06/25/22 0635   feeding supplement (NEPRO CARB STEADY) liquid 237 mL  237 mL Oral TID BM Jonetta Osgood, MD   237 mL at 06/24/22 2100   HYDROcodone-acetaminophen (NORCO/VICODIN) 5-325 MG per tablet 1 tablet  1 tablet Oral Q6H PRN Elgergawy, Silver Huguenin, MD       ipratropium-albuterol (DUONEB) 0.5-2.5 (3) MG/3ML nebulizer solution 3 mL  3 mL Nebulization Q6H PRN Elgergawy, Silver Huguenin, MD       loratadine (CLARITIN) tablet 10 mg  10 mg Oral Daily Howerter, Justin B, DO   10 mg at 06/25/22 0825   melatonin tablet 3 mg  3 mg Oral QHS PRN Howerter, Justin B, DO   3 mg at 06/24/22 2139   metoprolol tartrate (LOPRESSOR) tablet 12.5 mg  12.5 mg Oral BID Jonetta Osgood, MD   12.5 mg at 06/25/22 0825   midodrine (PROAMATINE) tablet 10 mg  10 mg Oral TID WC Jonetta Osgood, MD   10 mg at 06/25/22 0824   tamsulosin (FLOMAX) capsule 0.4 mg  0.4 mg Oral Daily Jonetta Osgood, MD   0.4 mg at 06/25/22 [id number]     Discharge Medications: Please see discharge summary for a list of discharge medications.  Relevant Imaging Results:  Relevant Lab Results:   Additional Information SSN: [id number]; Requires Dialysis transport to [facility] on TTS. Pt arrives at 11:3a5 for 11:55a chair time.  Benard Halsted, LCSW

## 2022-06-25 NOTE — Progress Notes (Signed)
  [facility] Progress Note    Assessment/ Plan:   ESRD: -outpatient orders: [place].  TTS.  F160.  Flow rates: 400/autoflow 1.5.  EDW 52.5 kg.  3 hours.  2K/2 calcium.  Right forearm aVF.  Meds: Calcitriol 0.5 mcg q. treatment -HD on TTS sched. HD tomorrow   AHRF -possible sequelae of recent bronchitis? -no evidence of fluid on chest imaging   Sepsis, gram positive bacteremia -2/26 bcx pos aerococcus viridans-considered to contaminant -per primary service -abx d/c'ed   Volume:  -will UF as tolerated -on midodrine for soft Bps. BP's much better now   Anemia of Chronic Kidney Disease:  -Hemoglobin 12.7, at goal for ESRD   Secondary Hyperparathyroidism/Hyperphosphatemia:  -PO4 at goal- 2.9. On phoslo and calcitriol. Monitor for now  Debility/deconditioning -PT. Per primary    Subjective:   Patient seen and examined  bedside. Wife at bedside as well. Currently doing PT. No complaints. Wife reports that his mentation is much better. Tolerated HD yesterday with net uf 0.3L   Objective:   BP 123/64   Pulse 95   Temp 97.9 F (36.6 C) (Oral)   Resp (!) 22   Ht '5\' 7"'$  (1.702 m)   Wt 53.2 kg   SpO2 90%   BMI 18.37 kg/m  No intake or output data in the 24 hours ending 06/25/22 1119  Weight change: -20.6 kg  Physical Exam: Gen: NAD, frail CVS: RRR Resp: cta b/l Abd: soft Ext: no edema Neuro: awake, alert Dialysis access: right forearm AVF +b/t  Imaging: No results found.  Labs: BMET Recent Labs  Lab 06/21/22 1215 06/22/22 0340 06/22/22 0351 06/23/22 0254 06/24/22 0433 06/25/22 0751  NA 141 137 133* 136 137 137  K 3.6 4.3 5.6* 4.2 4.0 3.7  CL 96* 92*  --  96* 97* 97*  CO2 24 22  --  '25 23 30  '$ GLUCOSE 74 86  --  82 97 105*  BUN 28* 39*  --  42* 60* 45*  CREATININE 5.60* 6.16*  --  5.69* 6.18* 4.88*  CALCIUM 9.1 8.5*  --  8.8* 9.1 9.2  PHOS  --  5.2*  --  6.1* 4.8* 2.9   CBC Recent Labs  Lab 06/21/22 1215 06/22/22 0340  06/22/22 0351 06/23/22 0254 06/23/22 1125 06/24/22 0433 06/25/22 0751  WBC 24.4* 33.2*  --  19.5* 17.5* 4.5 10.9*  NEUTROABS 22.7* 31.2*  --   --  14.7*  --   --   HGB 14.7 13.0   < > 12.5* 12.8* 13.0 12.7*  HCT 44.5 40.0   < > 37.4* 38.5* 38.0* 38.3*  MCV 102.5* 102.6*  --  98.7 99.7 99.0 99.2  PLT 244 227  --  197 193 202 178   < > = values in this interval not displayed.    Medications:     calcitRIOL  0.5 mcg Oral Q T,Th,Sa-HD   calcium acetate  667 mg Oral TID WC   Chlorhexidine Gluconate Cloth  6 each Topical Q0600   feeding supplement (NEPRO CARB STEADY)  237 mL Oral TID BM   loratadine  10 mg Oral Daily   metoprolol tartrate  12.5 mg Oral BID   midodrine  10 mg Oral TID WC   tamsulosin  0.4 mg Oral Daily      Gean Quint, MD [facility] 06/25/2022, 11:19 AM

## 2022-06-25 NOTE — Progress Notes (Signed)
Mobility Specialist Progress Note   06/25/22 1704  Mobility  Activity Ambulated with assistance to bathroom  Level of Assistance Minimal assist, patient does 75% or more  Assistive Device Front wheel walker  Distance Ambulated (ft) 14 ft  Activity Response Tolerated well  Mobility Referral Yes  $Mobility charge 1 Mobility   Pre Mobility: 81 HR, 92% SpO2 on RA  During Mobility: 96 HR, 88% SpO2 on RA  Post Mobility: 87 HR, 93% SpO2 on RA   Received in bed asleep and difficult to keep alert. Pt more alert as session progressed, once alert pt able to get EOB w/ minA d/t trunk weakness. Required minA to stand + verbal cues on hand placement throughout for transfer to BR. Minimal but successful BM. MinA for pericare while pt is standing. Returned back to bed w/o fault ,call bell in reach.      Holland Falling Mobility Specialist Please contact via SecureChat or  Rehab office at [phone]

## 2022-06-25 NOTE — Progress Notes (Signed)
Pt's case discussed with attending and nephrologist. Pt may be stable for d/c tomorrow but it is felt that pt would likely not be able to make it to out-pt HD clinic appt tomorrow (2nd shift). Pt's wife working to make arrangements for pt's care at home due to pt's care needs per attending. Will assist as needed.   Melven Sartorius Renal Navigator [phone]

## 2022-06-25 NOTE — TOC Initial Note (Signed)
Transition of Care [facility]) - Initial/Assessment Note    Patient Details  Name: Jeffrey Payne MRN: [id number] Date of Birth: Oct 06, 1932  [facility]) CM/SW Contact:    Benard Halsted, LCSW Phone Number: 06/25/2022, 10:17 AM  Clinical Narrative:                 CSW received consult for possible SNF at time of discharge. CSW spoke with patient's spouse at bedside. Patient's spouse shared that she would like for patient to return home as she does not feel he would do well at SNF. She stated he had been to [facility] last year and the nursing care was poor. She reported that she would like home health services through [facility] [place]. CSW discussed equipment needs with patient and she requested a wheelchair. She has a bedside commode at home. CSW confirmed PCP and address. CSW asked wife if she felt comfortable continuing to transport patient to dialysis and that CSW could do a referral to [employer]. She stated it will likely be a challenge but the wheelchair will help. She stated she may inquire of extra services from [employer]. No further questions reported at this time but will follow up to ensure needs are met.    Expected Discharge Plan: [place] Barriers to Discharge: Continued Medical Work up   Patient Goals and CMS Choice Patient states their goals for this hospitalization and ongoing recovery are:: To return home CMS Medicare.gov Compare Post Acute Care list provided to:: Patient Represenative (must comment) Choice offered to / list presented to : Spouse, Patient [place] ownership interest in [facility].provided to:: Spouse    Expected Discharge Plan and Services In-house Referral: Clinical Social Work Discharge Planning Services: CM Consult Post Acute Care Choice: Home Health, Dialysis, Durable Medical Equipment Living arrangements for the past 2 months: [place]                                       Prior Living Arrangements/Services Living arrangements for the past 2 months: [place] Lives with:: Spouse Patient language and need for interpreter reviewed:: Yes Do you feel safe going back to the place where you live?: Yes      Need for Family Participation in Patient Care: Yes (Comment) Care giver support system in place?: Yes (comment)   Criminal Activity/Legal Involvement Pertinent to Current Situation/Hospitalization: No - Comment as needed  Activities of Daily Living      Permission Sought/Granted Permission sought to share information with : Facility Sport and exercise psychologist, Family Supports Permission granted to share information with : Yes, Verbal Permission Granted  Share Information with NAME: Venetia Night  Permission granted to share info w AGENCY: Research scientist (physical sciences) granted to share info w Relationship: Spouse  Permission granted to share info w Contact Information: [phone]  Emotional Assessment Appearance:: Appears stated age Attitude/Demeanor/Rapport: Unable to Assess Affect (typically observed): Quiet Orientation: : Oriented to Self, Oriented to  Time Alcohol / Substance Use: Not Applicable Psych Involvement: No (comment)  Admission diagnosis:  Sepsis, due to unspecified organism, unspecified whether acute organ dysfunction present [facility]) [A41.9] Acute hypoxic respiratory failure ([place]) [J96.01] Patient Active Problem List   Diagnosis Date Noted   Acute hypoxic respiratory failure ([place]) 06/21/2022   Acute bronchitis 06/21/2022   Shortness of breath 0000000   Acute metabolic encephalopathy 0000000   Severe sepsis (  [place]) 06/21/2022   Allergic rhinitis 06/21/2022   Cough 06/15/2022   Elevated troponin 02/02/2022   End-stage renal disease on hemodialysis ([place]) 02/02/2022   History of COVID-19 02/02/2022   Protein-calorie malnutrition, severe 02/11/2021   Ileus, unspecified ([place]) 02/02/2021   Chronic combined systolic and diastolic  heart failure ([place]) 02/01/2021   Ear stuffiness, right 01/31/2021   Closed fracture of neck of left femur ([place]) 01/30/2021   Scalp laceration 01/30/2021   Multifocal atrial tachycardia 01/30/2021   LBBB (left bundle branch block) 01/30/2021   Fall at home, initial encounter 01/29/2021   BPH with urinary obstruction 01/29/2021   History of anemia due to chronic kidney disease 01/29/2021   Leukocytosis 01/29/2021   Postinflammatory pulmonary fibrosis ([place]) 09/09/2020   Allergic rhinitis due to pollen 09/09/2020   GERD (gastroesophageal reflux disease) 09/09/2020   Hyperlipidemia 09/09/2020   Hyperparathyroidism due to renal insufficiency ([place]) 09/09/2020   Osteoarthritis of knee 123XX123   Systolic murmur 123XX123   Acute kidney injury superimposed on CKD ([place]) 10/06/2019   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[place]) 10/05/2019   Bradycardia 10/05/2019   Hypotension 10/05/2019   Fatigue 10/05/2019   Essential hypertension    Heart murmur    PCP:  Jodene Nam, MD Pharmacy:   [facility], [place] - [address] AT [place] [address] Phone: [phone] Fax: [phone]     Social Determinants of Health (SDOH) Social History: SDOH Screenings   Food Insecurity: No Food Insecurity (06/15/2022)  Housing: Low Risk  (06/15/2022)  Transportation Needs: No Transportation Needs (06/15/2022)  Utilities: Not At Risk (06/15/2022)  Tobacco Use: Low Risk  (06/21/2022)   SDOH Interventions:     Readmission Risk Interventions     No data to display

## 2022-06-26 DIAGNOSIS — I10 Essential (primary) hypertension: Secondary | ICD-10-CM | POA: Diagnosis not present

## 2022-06-26 DIAGNOSIS — A419 Sepsis, unspecified organism: Secondary | ICD-10-CM | POA: Diagnosis not present

## 2022-06-26 DIAGNOSIS — N186 End stage renal disease: Secondary | ICD-10-CM | POA: Diagnosis not present

## 2022-06-26 DIAGNOSIS — J9601 Acute respiratory failure with hypoxia: Secondary | ICD-10-CM | POA: Diagnosis not present

## 2022-06-26 LAB — CBC
HCT: 39.2 % (ref 39.0–52.0)
HCT: 38.8 % — ABNORMAL LOW (ref 39.0–52.0)
Hemoglobin: 13.1 g/dL (ref 13.0–17.0)
Hemoglobin: 12.9 g/dL — ABNORMAL LOW (ref 13.0–17.0)
MCH: 33.1 pg (ref 26.0–34.0)
MCH: 33.4 pg (ref 26.0–34.0)
MCHC: 33.4 g/dL (ref 30.0–36.0)
MCHC: 33.2 g/dL (ref 30.0–36.0)
MCV: 99 fL (ref 80.0–100.0)
MCV: 100.5 fL — ABNORMAL HIGH (ref 80.0–100.0)
Platelets: 197 10*3/uL (ref 150–400)
Platelets: 202 10*3/uL (ref 150–400)
RBC: 3.96 MIL/uL — ABNORMAL LOW (ref 4.22–5.81)
RBC: 3.86 MIL/uL — ABNORMAL LOW (ref 4.22–5.81)
RDW: 15.4 % (ref 11.5–15.5)
RDW: 15.3 % (ref 11.5–15.5)
WBC: 8.7 10*3/uL (ref 4.0–10.5)
WBC: 9.1 10*3/uL (ref 4.0–10.5)
nRBC: 0 % (ref 0.0–0.2)
nRBC: 0 % (ref 0.0–0.2)

## 2022-06-26 LAB — RENAL FUNCTION PANEL
Albumin: 2.4 g/dL — ABNORMAL LOW (ref 3.5–5.0)
Albumin: 2.3 g/dL — ABNORMAL LOW (ref 3.5–5.0)
Anion gap: 16 — ABNORMAL HIGH (ref 5–15)
Anion gap: 13 (ref 5–15)
BUN: 57 mg/dL — ABNORMAL HIGH (ref 8–23)
BUN: 56 mg/dL — ABNORMAL HIGH (ref 8–23)
CO2: 24 mmol/L (ref 22–32)
CO2: 26 mmol/L (ref 22–32)
Calcium: 9.5 mg/dL (ref 8.9–10.3)
Calcium: 9.3 mg/dL (ref 8.9–10.3)
Chloride: 95 mmol/L — ABNORMAL LOW (ref 98–111)
Chloride: 98 mmol/L (ref 98–111)
Creatinine, Ser: 5.76 mg/dL — ABNORMAL HIGH (ref 0.61–1.24)
Creatinine, Ser: 5.78 mg/dL — ABNORMAL HIGH (ref 0.61–1.24)
GFR, Estimated: 9 mL/min — ABNORMAL LOW (ref >60)
GFR, Estimated: 9 mL/min — ABNORMAL LOW (ref >60)
Glucose, Bld: 91 mg/dL (ref 70–99)
Glucose, Bld: 87 mg/dL (ref 70–99)
Phosphorus: 3.2 mg/dL (ref 2.5–4.6)
Phosphorus: 3.2 mg/dL (ref 2.5–4.6)
Potassium: 3.8 mmol/L (ref 3.5–5.1)
Potassium: 3.9 mmol/L (ref 3.5–5.1)
Sodium: 135 mmol/L (ref 135–145)
Sodium: 137 mmol/L (ref 135–145)

## 2022-06-26 LAB — MAGNESIUM: Magnesium: 2.1 mg/dL (ref 1.7–2.4)

## 2022-06-26 MED ORDER — ALTEPLASE 2 MG IJ SOLR: 2.0000 mg | Freq: Once | INTRAMUSCULAR | Status: DC | PRN

## 2022-06-26 MED ORDER — HEPARIN SODIUM (PORCINE) 1000 UNIT/ML DIALYSIS: 1000.0000 [IU] | INTRAMUSCULAR | Status: DC | PRN

## 2022-06-26 MED ORDER — ALBUMIN HUMAN 25 % IV SOLN
INTRAVENOUS | Status: AC
  Administered 2022-06-26: 25 g
  Filled 2022-06-26: qty 100

## 2022-06-26 MED ORDER — HEPARIN SODIUM (PORCINE) 5000 UNIT/ML IJ SOLN
5000.0000 [IU] | Freq: Three times a day (TID) | INTRAMUSCULAR | Status: DC
  Administered 2022-06-26 – 2022-06-29 (×10): 5000 [IU] via SUBCUTANEOUS
  Filled 2022-06-26 (×11): qty 1

## 2022-06-26 MED ORDER — LIDOCAINE-PRILOCAINE 2.5-2.5 % EX CREA: 1.0000 | TOPICAL_CREAM | CUTANEOUS | Status: DC | PRN

## 2022-06-26 MED ORDER — LIDOCAINE HCL (PF) 1 % IJ SOLN: 5.0000 mL | INTRAMUSCULAR | Status: DC | PRN

## 2022-06-26 MED ORDER — PENTAFLUOROPROP-TETRAFLUOROETH EX AERO: 1.0000 | INHALATION_SPRAY | CUTANEOUS | Status: DC | PRN

## 2022-06-26 MED ORDER — ANTICOAGULANT SODIUM CITRATE 4% (200MG/5ML) IV SOLN: 5.0000 mL | Status: DC | PRN

## 2022-06-26 NOTE — Progress Notes (Signed)
Received patient in bed ,alert to person and place.Blood pressure on the low side.   Access used Left lower arm fistula that worked well.  Duration of treatment : 3 hours.  Medicine given ; Albumin 25 g.  Fluid removed: Achieved prescribed UF goal of 500 cc.  Hemodialysis issue /comment;None.  Hand off to the patient's nurse.

## 2022-06-26 NOTE — Progress Notes (Addendum)
PROGRESS NOTE        PATIENT DETAILS Name: Jeffrey Payne Age: 89 y.o. Sex: male Date of Birth: Oct 24, 1932 Admit Date: 06/21/2022 Admitting Physician Rhetta Mura, DO [id number], Christoper Fabian, MD  Brief Summary: Patient is a 89 y.o.  male w hx of chronic HFpEF/HFrEF, ESRD on HD TTS who presented to the hospital with acute hypoxic respiratory failure in the setting of acute bronchitis due to [place] virus.  Significant events: 2/19-2/20>> hospitalization for hypoxia from decompensated heart failure/Metapneumonia virus. 2/26>> admit to [facility]  Significant studies: 2/26>> CTA chest: No PE/no obvious PNA 2/26>> CT head: No acute intracranial process. 2/28>> echo: EF [id number] RV systolic function moderately reduced.  Significant microbiology data: 2/19>> respiratory virus panel:+ve Metapneumonia virus 2/26>> COVID/influenza/RSV PCR: Negative 2/26>> blood culture: Aerococcus Viridans 2/29>> blood culture: No growth  Procedures: None  Consults: Renal  Subjective: Lying comfortably in bed-no major issues overnight.  Appears frail but comfortable-much more awake and alert compared to yesterday.  Objective: Vitals: Blood pressure (!) 138/52, pulse 73, temperature 98 F (36.7 C), temperature source Oral, resp. rate 19, height '5\' 7"'$  (1.702 m), weight 52.3 kg, SpO2 98 %.   Gen Exam:not in any distress HEENT:atraumatic, normocephalic Chest: B/L clear to auscultation anteriorly CVS:S1S2 regular Abdomen:soft non tender, non distended Extremities:no edema Neurology: Non focal Skin: no rash  Pertinent Labs/Radiology:    Latest Ref Rng & Units 06/26/2022    8:32 AM 06/26/2022    7:01 AM 06/25/2022    7:51 AM  CBC  WBC 4.0 - 10.5 K/uL 9.1  8.7  10.9   Hemoglobin 13.0 - 17.0 g/dL 12.9  13.1  12.7   Hematocrit 39.0 - 52.0 % 38.8  39.2  38.3   Platelets 150 - 400 K/uL 202  197  178     Lab Results  Component Value Date   NA 137 06/26/2022   K 3.9  06/26/2022   CL 98 06/26/2022   CO2 26 06/26/2022      Assessment/Plan: Severe sepsis  Acute hypoxic respiratory failure due to bronchitis in the setting of recent [place] pneumonia virus Sepsis physiology has resolved Culture data as above Room air this morning Continue bronchodilators and other supportive care Mobilize/pulmonary tolerating is much as possible All antibiotics were discontinued-2/29-Aerococcus viridans and blood culture is considered to be a contaminant.  Repeat blood cultures negative so far.  Delirium Slightly confused on 2/28 Remains awake/alert this morning Maintain delirium precautions.  ESRD on HD TTS Nephrology following  Chronic HFrEF Volume status stable Volume removal with HD  HTN Tolerating low-dose beta-blocker Remains on midodrine.    Hx of PAF/MAT/SVT Continue low-dose beta-blocker Reveiwed prior cards note-not a candidate for anticoag  Debility/deconditioning Due to acute illness PT/OT eval recommending SNF-however spouse indicates that she will take him back to independent living with maximal home health services.  They had a prior bad experience at a local SNF   BMI: Estimated body mass index is 18.06 kg/m as calculated from the following:   Height as of this encounter: '5\' 7"'$  (1.702 m).   Weight as of this encounter: 52.3 kg.   Code status:   Code Status: DNR (Addendum:d/w spouse at bedside, she was to continue with treatment-but does not want CPR/Intubation-she is agreeable w a DNR order)  DVT Prophylaxis: SCDs Start: 06/21/22 1945   Family Communication: Left voicemail for  spouse on 3/2.   Disposition Plan: Status is: Inpatient Remains inpatient appropriate because: Severity of illness   Planned Discharge Destination:Home health  Addendum: spouse has had time to think things over-she now wants to pursue SNF rehab    Diet: Diet Order             Diet renal with fluid restriction Room service appropriate? No; Fluid  consistency: Thin  Diet effective now                     Antimicrobial agents: Anti-infectives (From admission, onward)    Start     Dose/Rate Route Frequency Ordered Stop   06/23/22 1200  vancomycin (VANCOREADY) IVPB 750 mg/150 mL  Status:  Discontinued        750 mg 150 mL/hr over 60 Minutes Intravenous  Once 06/23/22 1112 06/23/22 1124   06/22/22 2200  ceFEPIme (MAXIPIME) 1 g in sodium chloride 0.9 % 100 mL IVPB  Status:  Discontinued        1 g 200 mL/hr over 30 Minutes Intravenous Every 24 hours 06/21/22 1656 06/22/22 0942   06/22/22 1800  vancomycin (VANCOREADY) IVPB 750 mg/150 mL  Status:  Discontinued        750 mg 150 mL/hr over 60 Minutes Intravenous Every T-Th-Sa (1800) 06/22/22 1003 06/24/22 1106   06/21/22 1445  ceFEPIme (MAXIPIME) 1 g in sodium chloride 0.9 % 100 mL IVPB        1 g 200 mL/hr over 30 Minutes Intravenous  Once 06/21/22 1432 06/21/22 1524   06/21/22 1445  vancomycin (VANCOREADY) IVPB 1500 mg/300 mL        1,500 mg 150 mL/hr over 120 Minutes Intravenous  Once 06/21/22 1432 06/21/22 1734   06/21/22 1435  vancomycin variable dose per unstable renal function (pharmacist dosing)  Status:  Discontinued         Does not apply See admin instructions 06/21/22 1435 06/22/22 1003   06/21/22 1430  ceFEPIme (MAXIPIME) 1 g in sodium chloride 0.9 % 100 mL IVPB  Status:  Discontinued        1 g 200 mL/hr over 30 Minutes Intravenous  Once 06/21/22 1416 06/21/22 1432   06/21/22 1430  metroNIDAZOLE (FLAGYL) IVPB 500 mg        500 mg 100 mL/hr over 60 Minutes Intravenous  Once 06/21/22 1416 06/21/22 1636   06/21/22 1430  vancomycin (VANCOCIN) IVPB 1000 mg/200 mL premix  Status:  Discontinued        1,000 mg 200 mL/hr over 60 Minutes Intravenous  Once 06/21/22 1416 06/21/22 1432        MEDICATIONS: Scheduled Meds:  calcitRIOL  0.5 mcg Oral Q T,Th,Sa-HD   calcium acetate  667 mg Oral TID WC   Chlorhexidine Gluconate Cloth  6 each Topical Q0600   feeding  supplement (NEPRO CARB STEADY)  237 mL Oral TID BM   loratadine  10 mg Oral Daily   metoprolol tartrate  12.5 mg Oral BID   midodrine  10 mg Oral TID WC   tamsulosin  0.4 mg Oral Daily   Continuous Infusions:  anticoagulant sodium citrate      PRN Meds:.acetaminophen **OR** acetaminophen, albuterol, ALPRAZolam, alteplase, anticoagulant sodium citrate, benzonatate, heparin, HYDROcodone-acetaminophen, ipratropium-albuterol, lidocaine (PF), lidocaine-prilocaine, melatonin, pentafluoroprop-tetrafluoroeth   I have personally reviewed following labs and imaging studies  LABORATORY DATA: CBC: Recent Labs  Lab 06/21/22 1215 06/22/22 0340 06/22/22 0351 06/23/22 1125 06/24/22 0433 06/25/22 0751 06/26/22 0701 06/26/22 0832  WBC  24.4* 33.2*   < > 17.5* 4.5 10.9* 8.7 9.1  NEUTROABS 22.7* 31.2*  --  14.7*  --   --   --   --   HGB 14.7 13.0   < > 12.8* 13.0 12.7* 13.1 12.9*  HCT 44.5 40.0   < > 38.5* 38.0* 38.3* 39.2 38.8*  MCV 102.5* 102.6*   < > 99.7 99.0 99.2 99.0 100.5*  PLT 244 227   < > 193 202 178 197 202   < > = values in this interval not displayed.     Basic Metabolic Panel: Recent Labs  Lab 06/21/22 1215 06/21/22 2245 06/22/22 0340 06/22/22 0351 06/23/22 0254 06/24/22 0433 06/25/22 0751 06/26/22 0704 06/26/22 0832  NA  --   --  137   < > 136 137 137 135 137  K  --   --  4.3   < > 4.2 4.0 3.7 3.8 3.9  CL  --   --  92*  --  96* 97* 97* 95* 98  CO2  --   --  22  --  '25 23 30 24 26  '$ GLUCOSE  --   --  86  --  82 97 105* 91 87  BUN  --   --  39*  --  42* 60* 45* 57* 56*  CREATININE  --   --  6.16*  --  5.69* 6.18* 4.88* 5.76* 5.78*  CALCIUM  --   --  8.5*  --  8.8* 9.1 9.2 9.5 9.3  MG  --  1.9 1.7  --   --   --   --   --   --   PHOS   < >  --  5.2*  --  6.1* 4.8* 2.9 3.2 3.2   < > = values in this interval not displayed.     GFR: Estimated Creatinine Clearance: 6.7 mL/min (A) (by C-G formula based on SCr of 5.78 mg/dL (H)).  Liver Function Tests: Recent Labs   Lab 06/21/22 1215 06/22/22 0340 06/23/22 0254 06/24/22 0433 06/25/22 0751 06/26/22 0704 06/26/22 0832  AST 16 15  --   --   --   --   --   ALT 9 10  --   --   --   --   --   ALKPHOS 112 102  --   --   --   --   --   BILITOT 1.9* 1.8*  --   --   --   --   --   PROT 6.6 6.2*  --   --   --   --   --   ALBUMIN 3.0* 2.6* 2.3* 2.4* 2.4* 2.4* 2.3*    No results for input(s): "LIPASE", "AMYLASE" in the last 168 hours. Recent Labs  Lab 06/22/22 0340  AMMONIA 33     Coagulation Profile: Recent Labs  Lab 06/22/22 0340  INR 1.2     Cardiac Enzymes: No results for input(s): "CKTOTAL", "CKMB", "CKMBINDEX", "TROPONINI" in the last 168 hours.  BNP (last 3 results) No results for input(s): "PROBNP" in the last 8760 hours.  Lipid Profile: No results for input(s): "CHOL", "HDL", "LDLCALC", "TRIG", "CHOLHDL", "LDLDIRECT" in the last 72 hours.  Thyroid Function Tests: No results for input(s): "TSH", "T4TOTAL", "FREET4", "T3FREE", "THYROIDAB" in the last 72 hours.   Anemia Panel: No results for input(s): "VITAMINB12", "FOLATE", "FERRITIN", "TIBC", "IRON", "RETICCTPCT" in the last 72 hours.   Urine analysis:    Component Value Date/Time  COLORURINE YELLOW 04/06/2022 [place] 04/06/2022 1554   LABSPEC 1.010 04/06/2022 1554   PHURINE 7.5 04/06/2022 1554   GLUCOSEU NEGATIVE 04/06/2022 1554   HGBUR TRACE (A) 04/06/2022 1554   BILIRUBINUR NEGATIVE 04/06/2022 1554   KETONESUR NEGATIVE 04/06/2022 1554   PROTEINUR 100 (A) 04/06/2022 1554   NITRITE NEGATIVE 04/06/2022 1554   LEUKOCYTESUR NEGATIVE 04/06/2022 1554    Sepsis Labs: Lactic Acid, Venous    Component Value Date/Time   LATICACIDVEN 1.1 06/21/2022 1508    MICROBIOLOGY: Recent Results (from the past 240 hour(s))  Blood Culture (routine x 2)     Status: Abnormal   Collection Time: 06/21/22 12:15 PM   Specimen: BLOOD  Result Value Ref Range Status   Specimen Description BLOOD LEFT ANTECUBITAL   Final   Special Requests   Final    BOTTLES DRAWN AEROBIC AND ANAEROBIC Blood Culture adequate volume   Culture  Setup Time   Final    GRAM POSITIVE COCCI IN BOTH AEROBIC AND ANAEROBIC BOTTLES Organism ID to follow CRITICAL RESULT CALLED TO, READ BACK BY AND VERIFIED WITH: Ezekiel Slocumb PHARMD, AT [id number] 06/22/22 D. VANHOOK    Culture (A)  Final    AEROCOCCUS VIRIDANS Standardized susceptibility testing for this organism is not available. Performed at [facility], [place] [address], [place], [place] [address]    Report Status 06/24/2022 FINAL  Final  Resp panel by RT-PCR (RSV, Flu A&B, Covid) Anterior Nasal Swab     Status: None   Collection Time: 06/21/22 12:15 PM   Specimen: Anterior Nasal Swab  Result Value Ref Range Status   SARS Coronavirus 2 by RT PCR NEGATIVE NEGATIVE Final   Influenza A by PCR NEGATIVE NEGATIVE Final   Influenza B by PCR NEGATIVE NEGATIVE Final    Comment: (NOTE) The Xpert Xpress SARS-CoV-2/FLU/RSV plus assay is intended as an aid in the diagnosis of influenza from Nasopharyngeal swab specimens and should not be used as a sole basis for treatment. Nasal washings and aspirates are unacceptable for Xpert Xpress SARS-CoV-2/FLU/RSV testing.  Fact Sheet for Patients: EntrepreneurPulse.com.au  Fact Sheet for Healthcare Providers: IncredibleEmployment.be  This test is not yet approved or cleared by the [place] FDA and has been authorized for detection and/or diagnosis of SARS-CoV-2 by FDA under an Emergency Use Authorization (EUA). This EUA will remain in effect (meaning this test can be used) for the duration of the COVID-19 declaration under Section 564(b)(1) of the Act, 21 U.S.C. section 360bbb-3(b)(1), unless the authorization is terminated or revoked.     Resp Syncytial Virus by PCR NEGATIVE NEGATIVE Final    Comment: (NOTE) Fact Sheet for Patients: EntrepreneurPulse.com.au  Fact Sheet for  Healthcare Providers: IncredibleEmployment.be  This test is not yet approved or cleared by the [place] FDA and has been authorized for detection and/or diagnosis of SARS-CoV-2 by FDA under an Emergency Use Authorization (EUA). This EUA will remain in effect (meaning this test can be used) for the duration of the COVID-19 declaration under Section 564(b)(1) of the Act, 21 U.S.C. section 360bbb-3(b)(1), unless the authorization is terminated or revoked.  Performed at [facility], [place] [address] [place], [place] [address]   Blood Culture ID Panel (Reflexed)     Status: None   Collection Time: 06/21/22 12:15 PM  Result Value Ref Range Status   Enterococcus faecalis NOT DETECTED NOT DETECTED Final   Enterococcus Faecium NOT DETECTED NOT DETECTED Final   Listeria monocytogenes NOT DETECTED NOT DETECTED Final  Staphylococcus species NOT DETECTED NOT DETECTED Final   Staphylococcus aureus (BCID) NOT DETECTED NOT DETECTED Final   Staphylococcus epidermidis NOT DETECTED NOT DETECTED Final   Staphylococcus lugdunensis NOT DETECTED NOT DETECTED Final   Streptococcus species NOT DETECTED NOT DETECTED Final   Streptococcus agalactiae NOT DETECTED NOT DETECTED Final   Streptococcus pneumoniae NOT DETECTED NOT DETECTED Final   Streptococcus pyogenes NOT DETECTED NOT DETECTED Final   A.calcoaceticus-baumannii NOT DETECTED NOT DETECTED Final   Bacteroides fragilis NOT DETECTED NOT DETECTED Final   Enterobacterales NOT DETECTED NOT DETECTED Final   Enterobacter cloacae complex NOT DETECTED NOT DETECTED Final   Escherichia coli NOT DETECTED NOT DETECTED Final   Klebsiella aerogenes NOT DETECTED NOT DETECTED Final   Klebsiella oxytoca NOT DETECTED NOT DETECTED Final   Klebsiella pneumoniae NOT DETECTED NOT DETECTED Final   Proteus species NOT DETECTED NOT DETECTED Final   Salmonella species NOT DETECTED NOT DETECTED Final   Serratia marcescens NOT DETECTED NOT  DETECTED Final   Haemophilus influenzae NOT DETECTED NOT DETECTED Final   Neisseria meningitidis NOT DETECTED NOT DETECTED Final   Pseudomonas aeruginosa NOT DETECTED NOT DETECTED Final   Stenotrophomonas maltophilia NOT DETECTED NOT DETECTED Final   Candida albicans NOT DETECTED NOT DETECTED Final   Candida auris NOT DETECTED NOT DETECTED Final   Candida glabrata NOT DETECTED NOT DETECTED Final   Candida krusei NOT DETECTED NOT DETECTED Final   Candida parapsilosis NOT DETECTED NOT DETECTED Final   Candida tropicalis NOT DETECTED NOT DETECTED Final   Cryptococcus neoformans/gattii NOT DETECTED NOT DETECTED Final    Comment: Performed at [facility], [address] [place], [place] [address]  MRSA Next Gen by PCR, Nasal     Status: None   Collection Time: 06/21/22  4:53 PM   Specimen: Nasal Mucosa; Nasal Swab  Result Value Ref Range Status   MRSA by PCR Next Gen NOT DETECTED NOT DETECTED Final    Comment: (NOTE) The GeneXpert MRSA Assay (FDA approved for NASAL specimens only), is one component of a comprehensive MRSA colonization surveillance program. It is not intended to diagnose MRSA infection nor to guide or monitor treatment for MRSA infections. Test performance is not FDA approved in patients less than 73 years old. Performed at [facility], [place] [address], [place], [place] [address]   Culture, blood (Routine X 2) w Reflex to ID Panel     Status: None (Preliminary result)   Collection Time: 06/24/22 12:21 PM   Specimen: BLOOD LEFT HAND  Result Value Ref Range Status   Specimen Description BLOOD LEFT HAND  Final   Special Requests   Final    BOTTLES DRAWN AEROBIC ONLY Blood Culture adequate volume   Culture   Final    NO GROWTH 2 DAYS Performed at [facility], [place] [address], [place], [place] [address]    Report Status PENDING  Incomplete  Culture, blood (Routine X 2) w Reflex to ID Panel     Status: None (Preliminary result)   Collection Time:  06/24/22 12:30 PM   Specimen: BLOOD LEFT HAND  Result Value Ref Range Status   Specimen Description BLOOD LEFT HAND  Final   Special Requests   Final    BOTTLES DRAWN AEROBIC AND ANAEROBIC Blood Culture adequate volume   Culture   Final    NO GROWTH 2 DAYS Performed at [facility], [place] [address], [place], [place] [address]    Report Status PENDING  Incomplete    RADIOLOGY STUDIES/RESULTS: No results found.   LOS: 5 days   Oren Binet, MD  [facility]    To contact the attending provider between 7A-7P or the covering provider during after hours 7P-7A, please log into the web site [email or web address] and access using universal [place] password for that web site. If you do not have the password, please call the hospital operator.  06/26/2022, 10:45 AM

## 2022-06-26 NOTE — Progress Notes (Signed)
[facility] Progress Note    Assessment/ Plan:   ESRD: -outpatient orders: [place].  TTS.  F160.  Flow rates: 400/autoflow 1.5.  EDW 52.5 kg.  3 hours.  2K/2 calcium.  Right forearm aVF.  Meds: Calcitriol 0.5 mcg q. treatment -HD on TTS sched   AHRF -possible sequelae of recent bronchitis? -no evidence of fluid on chest imaging   Sepsis, gram positive bacteremia -2/26 bcx pos aerococcus viridans-considered to contaminant -per primary service -abx d/c'ed   Volume:  -will UF as tolerated -on midodrine for soft Bps. BP's much better now   Anemia of Chronic Kidney Disease:  -Hemoglobin 12.9, at goal for ESRD   Secondary Hyperparathyroidism/Hyperphosphatemia:  -PO4 at goal- 3.2. On phoslo and calcitriol. Monitor for now  Debility/deconditioning -PT. Per primary  Dispo: wife declined SNF, per primary    Subjective:   Patient seen and examined on dialysis. No acute events. Tolerating treatment w/o issues thus far. He reports that he feels cold, extra blankets provided by RN.   Objective:   BP (!) 127/46   Pulse 73   Temp 98.4 F (36.9 C) (Oral)   Resp 18   Ht '5\' 7"'$  (1.702 m)   Wt 52.3 kg   SpO2 98%   BMI 18.06 kg/m  No intake or output data in the 24 hours ending 06/26/22 0926  Weight change: 0.6 kg  Physical Exam: Gen: NAD, frail CVS: RRR Resp: cta b/l Abd: soft Ext: no edema Neuro: awake, alert Dialysis access: right forearm AVF +b/t  Imaging: No results found.  Labs: BMET Recent Labs  Lab 06/21/22 1215 06/22/22 0340 06/22/22 0351 06/23/22 0254 06/24/22 0433 06/25/22 0751 06/26/22 0704 06/26/22 0832  NA 141 137 133* 136 137 137 135 137  K 3.6 4.3 5.6* 4.2 4.0 3.7 3.8 3.9  CL 96* 92*  --  96* 97* 97* 95* 98  CO2 24 22  --  '25 23 30 24 26  '$ GLUCOSE 74 86  --  82 97 105* 91 87  BUN 28* 39*  --  42* 60* 45* 57* 56*  CREATININE 5.60* 6.16*  --  5.69* 6.18* 4.88* 5.76* 5.78*  CALCIUM 9.1 8.5*  --  8.8* 9.1 9.2 9.5 9.3  PHOS   --  5.2*  --  6.1* 4.8* 2.9 3.2 3.2   CBC Recent Labs  Lab 06/21/22 1215 06/22/22 0340 06/22/22 0351 06/23/22 1125 06/24/22 0433 06/25/22 0751 06/26/22 0701 06/26/22 0832  WBC 24.4* 33.2*   < > 17.5* 4.5 10.9* 8.7 9.1  NEUTROABS 22.7* 31.2*  --  14.7*  --   --   --   --   HGB 14.7 13.0   < > 12.8* 13.0 12.7* 13.1 12.9*  HCT 44.5 40.0   < > 38.5* 38.0* 38.3* 39.2 38.8*  MCV 102.5* 102.6*   < > 99.7 99.0 99.2 99.0 100.5*  PLT 244 227   < > 193 202 178 197 202   < > = values in this interval not displayed.    Medications:     calcitRIOL  0.5 mcg Oral Q T,Th,Sa-HD   calcium acetate  667 mg Oral TID WC   Chlorhexidine Gluconate Cloth  6 each Topical Q0600   feeding supplement (NEPRO CARB STEADY)  237 mL Oral TID BM   loratadine  10 mg Oral Daily   metoprolol tartrate  12.5 mg Oral BID   midodrine  10 mg Oral TID WC   tamsulosin  0.4 mg Oral Daily  Gean Quint, MD [facility] 06/26/2022, 9:26 AM

## 2022-06-26 NOTE — TOC Progression Note (Signed)
Transition of Care [facility]) - Progression Note    Patient Details  Name: Jeffrey Payne MRN: [id number] Date of Birth: 1933/03/17  Transition of [facility]) CM/SW [place], [place] Phone Number: 06/26/2022, 2:21 PM  Clinical Narrative:     SW notified by MD, pt's wife agreeable to SNF.   SW met with pt, wife and daughter at bedside. CNA tending to pt. SW discussed options for rehab with pt's wife and daughter. Wife adamant she does not want [place] due to poor experience last time. SW explained process and the need to find a SNF that is in network with insurance and able to accommodate HD. Wife preference for 3 star and above SNF's. SW left MCR with wife and daughter to review further, encouraged them to tour.     Expected Discharge Plan: [place] Barriers to Discharge: Continued Medical Work up  Expected Discharge Plan and Services In-house Referral: Clinical Social Work Discharge Planning Services: CM Consult Post Acute Care Choice: Home Health, Dialysis, Durable Medical Equipment Living arrangements for the past 2 months: [place]                 DME Arranged: Youth worker wheelchair with seat cushion DME Agency: [employer] Date DME Agency Contacted: 06/25/22 Time DME Agency Contacted: 1157 Representative spoke with at DME Agency: [place]: PT, OT [place] Agency:  Secondary school teacher in conjunction with [employer]) Date [place]: 06/25/22 Time [place]: 1158 Representative spoke with at [place]: [place] Determinants of Health ([place]) Interventions SDOH Screenings   Food Insecurity: No Food Insecurity (06/15/2022)  Housing: Low Risk  (06/15/2022)  Transportation Needs: No Transportation Needs (06/15/2022)  Utilities: Not At Risk (06/15/2022)  Tobacco Use: Low Risk  (06/21/2022)    Readmission Risk Interventions     No data to display

## 2022-06-27 DIAGNOSIS — A419 Sepsis, unspecified organism: Secondary | ICD-10-CM | POA: Diagnosis not present

## 2022-06-27 DIAGNOSIS — I10 Essential (primary) hypertension: Secondary | ICD-10-CM | POA: Diagnosis not present

## 2022-06-27 DIAGNOSIS — J9601 Acute respiratory failure with hypoxia: Secondary | ICD-10-CM | POA: Diagnosis not present

## 2022-06-27 DIAGNOSIS — N186 End stage renal disease: Secondary | ICD-10-CM | POA: Diagnosis not present

## 2022-06-27 NOTE — Progress Notes (Signed)
PROGRESS NOTE        PATIENT DETAILS Name: Jeffrey Payne Age: 89 y.o. Sex: male Date of Birth: February 13, 1933 Admit Date: 06/21/2022 Admitting Physician Rhetta Mura, DO [id number], Christoper Fabian, MD  Brief Summary: Patient is a 89 y.o.  male w hx of chronic HFpEF/HFrEF, ESRD on HD TTS who presented to the hospital with acute hypoxic respiratory failure in the setting of acute bronchitis due to [place] virus.  Significant events: 2/19-2/20>> hospitalization for hypoxia from decompensated heart failure/Metapneumonia virus. 2/26>> admit to [facility]  Significant studies: 2/26>> CTA chest: No PE/no obvious PNA 2/26>> CT head: No acute intracranial process. 2/28>> echo: EF [id number] RV systolic function moderately reduced.  Significant microbiology data: 2/19>> respiratory virus panel:+ve Metapneumonia virus 2/26>> COVID/influenza/RSV PCR: Negative 2/26>> blood culture: Aerococcus Viridans 2/29>> blood culture: No growth  Procedures: None  Consults: Renal  Subjective: No major issues overnight.  Frail looking.  Completely awake and alert this morning.  No family at bedside.  Objective: Vitals: Blood pressure 130/71, pulse 89, temperature 98.4 F (36.9 C), temperature source Oral, resp. rate (!) 21, height '5\' 7"'$  (1.702 m), weight 50 kg, SpO2 93 %.   Gen Exam:not in any distress HEENT:atraumatic, normocephalic Chest: B/L clear to auscultation anteriorly CVS:S1S2 regular Abdomen:soft non tender, non distended Extremities:no edema Neurology: Non focal Skin: no rash  Pertinent Labs/Radiology:    Latest Ref Rng & Units 06/26/2022    8:32 AM 06/26/2022    7:01 AM 06/25/2022    7:51 AM  CBC  WBC 4.0 - 10.5 K/uL 9.1  8.7  10.9   Hemoglobin 13.0 - 17.0 g/dL 12.9  13.1  12.7   Hematocrit 39.0 - 52.0 % 38.8  39.2  38.3   Platelets 150 - 400 K/uL 202  197  178     Lab Results  Component Value Date   NA 137 06/26/2022   K 3.9 06/26/2022   CL 98 06/26/2022    CO2 26 06/26/2022      Assessment/Plan: Severe sepsis  Acute hypoxic respiratory failure due to bronchitis in the setting of recent Meta pneumonia virus Sepsis physiology has resolved Culture data as above Room air this morning Continue bronchodilators and other supportive care Mobilize/pulmonary tolerating is much as possible All antibiotics were discontinued-2/29-Aerococcus viridans and blood culture is considered to be a contaminant.  Repeat blood cultures negative so far.  Delirium Slightly confused on 2/28 Much better-remains awake/alert this morning. Maintain delirium precautions.  ESRD on HD TTS Nephrology following  Chronic HFrEF Volume status stable Volume removal with HD  HTN Tolerating low-dose beta-blocker Remains on midodrine.    Hx of PAF/MAT/SVT Continue low-dose beta-blocker Reveiwed prior cards note-not a candidate for anticoag  Debility/deconditioning Due to acute illness Recommendations are for SNF-for spouse was initially contemplating taking home with home health given bed prior experience with SNF, however on 3/2-she now wants to revisit other SNF's.  BMI: Estimated body mass index is 17.26 kg/m as calculated from the following:   Height as of this encounter: '5\' 7"'$  (1.702 m).   Weight as of this encounter: 50 kg.   Code status:   Code Status: DNR (Addendum:d/w spouse at bedside, she was to continue with treatment-but does not want CPR/Intubation-she is agreeable w a DNR order)  DVT Prophylaxis: heparin injection 5,000 Units Start: 06/26/22 1400 SCDs Start: 06/21/22 1945   Family  Communication: Spouse at bedside on 3/2   Disposition Plan: Status is: Inpatient Remains inpatient appropriate because: Severity of illness   Planned Discharge Destination: S left    Diet: Diet Order             Diet renal with fluid restriction Room service appropriate? No; Fluid consistency: Thin  Diet effective now                      Antimicrobial agents: Anti-infectives (From admission, onward)    Start     Dose/Rate Route Frequency Ordered Stop   06/23/22 1200  vancomycin (VANCOREADY) IVPB 750 mg/150 mL  Status:  Discontinued        750 mg 150 mL/hr over 60 Minutes Intravenous  Once 06/23/22 1112 06/23/22 1124   06/22/22 2200  ceFEPIme (MAXIPIME) 1 g in sodium chloride 0.9 % 100 mL IVPB  Status:  Discontinued        1 g 200 mL/hr over 30 Minutes Intravenous Every 24 hours 06/21/22 1656 06/22/22 0942   06/22/22 1800  vancomycin (VANCOREADY) IVPB 750 mg/150 mL  Status:  Discontinued        750 mg 150 mL/hr over 60 Minutes Intravenous Every T-Th-Sa (1800) 06/22/22 1003 06/24/22 1106   06/21/22 1445  ceFEPIme (MAXIPIME) 1 g in sodium chloride 0.9 % 100 mL IVPB        1 g 200 mL/hr over 30 Minutes Intravenous  Once 06/21/22 1432 06/21/22 1524   06/21/22 1445  vancomycin (VANCOREADY) IVPB 1500 mg/300 mL        1,500 mg 150 mL/hr over 120 Minutes Intravenous  Once 06/21/22 1432 06/21/22 1734   06/21/22 1435  vancomycin variable dose per unstable renal function (pharmacist dosing)  Status:  Discontinued         Does not apply See admin instructions 06/21/22 1435 06/22/22 1003   06/21/22 1430  ceFEPIme (MAXIPIME) 1 g in sodium chloride 0.9 % 100 mL IVPB  Status:  Discontinued        1 g 200 mL/hr over 30 Minutes Intravenous  Once 06/21/22 1416 06/21/22 1432   06/21/22 1430  metroNIDAZOLE (FLAGYL) IVPB 500 mg        500 mg 100 mL/hr over 60 Minutes Intravenous  Once 06/21/22 1416 06/21/22 1636   06/21/22 1430  vancomycin (VANCOCIN) IVPB 1000 mg/200 mL premix  Status:  Discontinued        1,000 mg 200 mL/hr over 60 Minutes Intravenous  Once 06/21/22 1416 06/21/22 1432        MEDICATIONS: Scheduled Meds:  calcitRIOL  0.5 mcg Oral Q T,Th,Sa-HD   calcium acetate  667 mg Oral TID WC   Chlorhexidine Gluconate Cloth  6 each Topical Q0600   feeding supplement (NEPRO CARB STEADY)  237 mL Oral TID BM   heparin  injection (subcutaneous)  5,000 Units Subcutaneous Q8H   loratadine  10 mg Oral Daily   metoprolol tartrate  12.5 mg Oral BID   midodrine  10 mg Oral TID WC   tamsulosin  0.4 mg Oral Daily   Continuous Infusions:    PRN Meds:.acetaminophen **OR** acetaminophen, albuterol, ALPRAZolam, benzonatate, HYDROcodone-acetaminophen, ipratropium-albuterol, melatonin   I have personally reviewed following labs and imaging studies  LABORATORY DATA: CBC: Recent Labs  Lab 06/21/22 1215 06/22/22 0340 06/22/22 0351 06/23/22 1125 06/24/22 0433 06/25/22 0751 06/26/22 0701 06/26/22 0832  WBC 24.4* 33.2*   < > 17.5* 4.5 10.9* 8.7 9.1  NEUTROABS 22.7* 31.2*  --  14.7*  --   --   --   --   HGB 14.7 13.0   < > 12.8* 13.0 12.7* 13.1 12.9*  HCT 44.5 40.0   < > 38.5* 38.0* 38.3* 39.2 38.8*  MCV 102.5* 102.6*   < > 99.7 99.0 99.2 99.0 100.5*  PLT 244 227   < > 193 202 178 197 202   < > = values in this interval not displayed.     Basic Metabolic Panel: Recent Labs  Lab 06/21/22 1215 06/21/22 2245 06/22/22 0340 06/22/22 0351 06/23/22 0254 06/24/22 0433 06/25/22 0751 06/26/22 0700 06/26/22 0704 06/26/22 0832  NA  --   --  137   < > 136 137 137  --  135 137  K  --   --  4.3   < > 4.2 4.0 3.7  --  3.8 3.9  CL  --   --  92*  --  96* 97* 97*  --  95* 98  CO2  --   --  22  --  '25 23 30  '$ --  24 26  GLUCOSE  --   --  86  --  82 97 105*  --  91 87  BUN  --   --  39*  --  42* 60* 45*  --  57* 56*  CREATININE  --   --  6.16*  --  5.69* 6.18* 4.88*  --  5.76* 5.78*  CALCIUM  --   --  8.5*  --  8.8* 9.1 9.2  --  9.5 9.3  MG  --  1.9 1.7  --   --   --   --  2.1  --   --   PHOS   < >  --  5.2*  --  6.1* 4.8* 2.9  --  3.2 3.2   < > = values in this interval not displayed.     GFR: Estimated Creatinine Clearance: 6.4 mL/min (A) (by C-G formula based on SCr of 5.78 mg/dL (H)).  Liver Function Tests: Recent Labs  Lab 06/21/22 1215 06/22/22 0340 06/23/22 0254 06/24/22 0433 06/25/22 0751  06/26/22 0704 06/26/22 0832  AST 16 15  --   --   --   --   --   ALT 9 10  --   --   --   --   --   ALKPHOS 112 102  --   --   --   --   --   BILITOT 1.9* 1.8*  --   --   --   --   --   PROT 6.6 6.2*  --   --   --   --   --   ALBUMIN 3.0* 2.6* 2.3* 2.4* 2.4* 2.4* 2.3*    No results for input(s): "LIPASE", "AMYLASE" in the last 168 hours. Recent Labs  Lab 06/22/22 0340  AMMONIA 33     Coagulation Profile: Recent Labs  Lab 06/22/22 0340  INR 1.2     Cardiac Enzymes: No results for input(s): "CKTOTAL", "CKMB", "CKMBINDEX", "TROPONINI" in the last 168 hours.  BNP (last 3 results) No results for input(s): "PROBNP" in the last 8760 hours.  Lipid Profile: No results for input(s): "CHOL", "HDL", "LDLCALC", "TRIG", "CHOLHDL", "LDLDIRECT" in the last 72 hours.  Thyroid Function Tests: No results for input(s): "TSH", "T4TOTAL", "FREET4", "T3FREE", "THYROIDAB" in the last 72 hours.   Anemia Panel: No results for input(s): "VITAMINB12", "FOLATE", "FERRITIN", "TIBC", "IRON", "RETICCTPCT" in the last  72 hours.   Urine analysis:    Component Value Date/Time   COLORURINE YELLOW 04/06/2022 [place] 04/06/2022 1554   LABSPEC 1.010 04/06/2022 1554   PHURINE 7.5 04/06/2022 1554   GLUCOSEU NEGATIVE 04/06/2022 1554   HGBUR TRACE (A) 04/06/2022 1554   BILIRUBINUR NEGATIVE 04/06/2022 1554   KETONESUR NEGATIVE 04/06/2022 1554   PROTEINUR 100 (A) 04/06/2022 1554   NITRITE NEGATIVE 04/06/2022 1554   LEUKOCYTESUR NEGATIVE 04/06/2022 1554    Sepsis Labs: Lactic Acid, Venous    Component Value Date/Time   LATICACIDVEN 1.1 06/21/2022 1508    MICROBIOLOGY: Recent Results (from the past 240 hour(s))  Blood Culture (routine x 2)     Status: Abnormal   Collection Time: 06/21/22 12:15 PM   Specimen: BLOOD  Result Value Ref Range Status   Specimen Description BLOOD LEFT ANTECUBITAL  Final   Special Requests   Final    BOTTLES DRAWN AEROBIC AND ANAEROBIC Blood  Culture adequate volume   Culture  Setup Time   Final    GRAM POSITIVE COCCI IN BOTH AEROBIC AND ANAEROBIC BOTTLES Organism ID to follow CRITICAL RESULT CALLED TO, READ BACK BY AND VERIFIED WITH: Ezekiel Slocumb PHARMD, AT [id number] 06/22/22 D. VANHOOK    Culture (A)  Final    AEROCOCCUS VIRIDANS Standardized susceptibility testing for this organism is not available. Performed at [facility], [place] [address], [place], [place] [address]    Report Status 06/24/2022 FINAL  Final  Resp panel by RT-PCR (RSV, Flu A&B, Covid) Anterior Nasal Swab     Status: None   Collection Time: 06/21/22 12:15 PM   Specimen: Anterior Nasal Swab  Result Value Ref Range Status   SARS Coronavirus 2 by RT PCR NEGATIVE NEGATIVE Final   Influenza A by PCR NEGATIVE NEGATIVE Final   Influenza B by PCR NEGATIVE NEGATIVE Final    Comment: (NOTE) The Xpert Xpress SARS-CoV-2/FLU/RSV plus assay is intended as an aid in the diagnosis of influenza from Nasopharyngeal swab specimens and should not be used as a sole basis for treatment. Nasal washings and aspirates are unacceptable for Xpert Xpress SARS-CoV-2/FLU/RSV testing.  Fact Sheet for Patients: EntrepreneurPulse.com.au  Fact Sheet for Healthcare Providers: IncredibleEmployment.be  This test is not yet approved or cleared by the [place] FDA and has been authorized for detection and/or diagnosis of SARS-CoV-2 by FDA under an Emergency Use Authorization (EUA). This EUA will remain in effect (meaning this test can be used) for the duration of the COVID-19 declaration under Section 564(b)(1) of the Act, 21 U.S.C. section 360bbb-3(b)(1), unless the authorization is terminated or revoked.     Resp Syncytial Virus by PCR NEGATIVE NEGATIVE Final    Comment: (NOTE) Fact Sheet for Patients: EntrepreneurPulse.com.au  Fact Sheet for Healthcare Providers: IncredibleEmployment.be  This test  is not yet approved or cleared by the [place] FDA and has been authorized for detection and/or diagnosis of SARS-CoV-2 by FDA under an Emergency Use Authorization (EUA). This EUA will remain in effect (meaning this test can be used) for the duration of the COVID-19 declaration under Section 564(b)(1) of the Act, 21 U.S.C. section 360bbb-3(b)(1), unless the authorization is terminated or revoked.  Performed at [facility], [place] [address] [place], [place] [address]   Blood Culture ID Panel (Reflexed)     Status: None   Collection Time: 06/21/22 12:15 PM  Result Value Ref Range Status   Enterococcus faecalis NOT DETECTED NOT DETECTED Final   Enterococcus Faecium  NOT DETECTED NOT DETECTED Final   Listeria monocytogenes NOT DETECTED NOT DETECTED Final   Staphylococcus species NOT DETECTED NOT DETECTED Final   Staphylococcus aureus (BCID) NOT DETECTED NOT DETECTED Final   Staphylococcus epidermidis NOT DETECTED NOT DETECTED Final   Staphylococcus lugdunensis NOT DETECTED NOT DETECTED Final   Streptococcus species NOT DETECTED NOT DETECTED Final   Streptococcus agalactiae NOT DETECTED NOT DETECTED Final   Streptococcus pneumoniae NOT DETECTED NOT DETECTED Final   Streptococcus pyogenes NOT DETECTED NOT DETECTED Final   A.calcoaceticus-baumannii NOT DETECTED NOT DETECTED Final   Bacteroides fragilis NOT DETECTED NOT DETECTED Final   Enterobacterales NOT DETECTED NOT DETECTED Final   Enterobacter cloacae complex NOT DETECTED NOT DETECTED Final   Escherichia coli NOT DETECTED NOT DETECTED Final   Klebsiella aerogenes NOT DETECTED NOT DETECTED Final   Klebsiella oxytoca NOT DETECTED NOT DETECTED Final   Klebsiella pneumoniae NOT DETECTED NOT DETECTED Final   Proteus species NOT DETECTED NOT DETECTED Final   Salmonella species NOT DETECTED NOT DETECTED Final   Serratia marcescens NOT DETECTED NOT DETECTED Final   Haemophilus influenzae NOT DETECTED NOT DETECTED Final    Neisseria meningitidis NOT DETECTED NOT DETECTED Final   Pseudomonas aeruginosa NOT DETECTED NOT DETECTED Final   Stenotrophomonas maltophilia NOT DETECTED NOT DETECTED Final   Candida albicans NOT DETECTED NOT DETECTED Final   Candida auris NOT DETECTED NOT DETECTED Final   Candida glabrata NOT DETECTED NOT DETECTED Final   Candida krusei NOT DETECTED NOT DETECTED Final   Candida parapsilosis NOT DETECTED NOT DETECTED Final   Candida tropicalis NOT DETECTED NOT DETECTED Final   Cryptococcus neoformans/gattii NOT DETECTED NOT DETECTED Final    Comment: Performed at [facility], [address] [place], [place] [address]  MRSA Next Gen by PCR, Nasal     Status: None   Collection Time: 06/21/22  4:53 PM   Specimen: Nasal Mucosa; Nasal Swab  Result Value Ref Range Status   MRSA by PCR Next Gen NOT DETECTED NOT DETECTED Final    Comment: (NOTE) The GeneXpert MRSA Assay (FDA approved for NASAL specimens only), is one component of a comprehensive MRSA colonization surveillance program. It is not intended to diagnose MRSA infection nor to guide or monitor treatment for MRSA infections. Test performance is not FDA approved in patients less than 87 years old. Performed at [facility], [place] [address], [place], [place] [address]   Culture, blood (Routine X 2) w Reflex to ID Panel     Status: None (Preliminary result)   Collection Time: 06/24/22 12:21 PM   Specimen: BLOOD LEFT HAND  Result Value Ref Range Status   Specimen Description BLOOD LEFT HAND  Final   Special Requests   Final    BOTTLES DRAWN AEROBIC ONLY Blood Culture adequate volume   Culture   Final    NO GROWTH 3 DAYS Performed at [facility], [place] [address], [place],  [address]    Report Status PENDING  Incomplete  Culture, blood (Routine X 2) w Reflex to ID Panel     Status: None (Preliminary result)   Collection Time: 06/24/22 12:30 PM   Specimen: BLOOD LEFT HAND  Result Value Ref Range Status    Specimen Description BLOOD LEFT HAND  Final   Special Requests   Final    BOTTLES DRAWN AEROBIC AND ANAEROBIC Blood Culture adequate volume   Culture   Final    NO GROWTH 3 DAYS Performed at [facility], [place] [address], [place], [place] [address]    Report Status PENDING  Incomplete    RADIOLOGY STUDIES/RESULTS: No results found.   LOS: 6 days   Oren Binet, MD  [facility]    To contact the attending provider between 7A-7P or the covering provider during after hours 7P-7A, please log into the web site [email or web address] and access using universal [place] password for that web site. If you do not have the password, please call the hospital operator.  06/27/2022, 9:55 AM

## 2022-06-27 NOTE — Progress Notes (Signed)
[facility] Progress Note   Subjective:   Patient seen and examined at bedside while eating breakfast.  Reports dialysis went well yesterday.  No specific complaints.  Denies CP, SOB, abdominal pain, palpitations and n/v/d.   Objective Vitals:   06/26/22 2000 06/27/22 0334 06/27/22 0500 06/27/22 0821  BP: (!) 112/50 (!) 109/46  130/71  Pulse: 89 80  89  Resp: (!) 22 (!) 22  (!) 21  Temp:  98.5 F (36.9 C)  98.4 F (36.9 C)  TempSrc:  Oral  Oral  SpO2: 93% 93%    Weight:   50 kg   Height:       Physical Exam General:chronically ill appearing, frail, elderly male in NAD Heart:RRR, no mrg Lungs:CTAB anteriorly, nml WOB on 2L O2 Abdomen:soft, NTND Extremities:no LE edema Dialysis Access: RU AVF +b/t   Filed Weights   06/26/22 0820 06/26/22 1202 06/27/22 0500  Weight: 52.4 kg 51.9 kg 50 kg    Intake/Output Summary (Last 24 hours) at 06/27/2022 1006 Last data filed at 06/26/2022 1600 Gross per 24 hour  Intake --  Output 1000 ml  Net -1000 ml    Additional Objective Labs: Basic Metabolic Panel: Recent Labs  Lab 06/25/22 0751 06/26/22 0704 06/26/22 0832  NA 137 135 137  K 3.7 3.8 3.9  CL 97* 95* 98  CO2 '30 24 26  '$ GLUCOSE 105* 91 87  BUN 45* 57* 56*  CREATININE 4.88* 5.76* 5.78*  CALCIUM 9.2 9.5 9.3  PHOS 2.9 3.2 3.2   Liver Function Tests: Recent Labs  Lab 06/21/22 1215 06/22/22 0340 06/23/22 0254 06/25/22 0751 06/26/22 0704 06/26/22 0832  AST 16 15  --   --   --   --   ALT 9 10  --   --   --   --   ALKPHOS 112 102  --   --   --   --   BILITOT 1.9* 1.8*  --   --   --   --   PROT 6.6 6.2*  --   --   --   --   ALBUMIN 3.0* 2.6*   < > 2.4* 2.4* 2.3*   < > = values in this interval not displayed.   CBC: Recent Labs  Lab 06/21/22 1215 06/22/22 0340 06/22/22 0351 06/23/22 1125 06/24/22 0433 06/25/22 0751 06/26/22 0701 06/26/22 0832  WBC 24.4* 33.2*   < > 17.5* 4.5 10.9* 8.7 9.1  NEUTROABS 22.7* 31.2*  --  14.7*  --   --   --   --    HGB 14.7 13.0   < > 12.8* 13.0 12.7* 13.1 12.9*  HCT 44.5 40.0   < > 38.5* 38.0* 38.3* 39.2 38.8*  MCV 102.5* 102.6*   < > 99.7 99.0 99.2 99.0 100.5*  PLT 244 227   < > 193 202 178 197 202   < > = values in this interval not displayed.   Medications:   calcitRIOL  0.5 mcg Oral Q T,Th,Sa-HD   calcium acetate  667 mg Oral TID WC   Chlorhexidine Gluconate Cloth  6 each Topical Q0600   feeding supplement (NEPRO CARB STEADY)  237 mL Oral TID BM   heparin injection (subcutaneous)  5,000 Units Subcutaneous Q8H   loratadine  10 mg Oral Daily   metoprolol tartrate  12.5 mg Oral BID   midodrine  10 mg Oral TID WC   tamsulosin  0.4 mg Oral Daily    Dialysis Orders: [facility].  TTS.  F160.  Flow rates: 400/autoflow 1.5.  EDW 52.5 kg.  3 hours.  2K/2 calcium.   Right forearm aVF.   Meds: Calcitriol 0.5 mcg q. treatment  Assessment/Plan: 1. Sepsis/Acute hypoxic respiratory failure d/t bronchitis/virus - Improved. No evidence of pulmonary edema. Concern for possible bacteremia but considered contaminant.  Off ABX.  2. ESRD - TTS HD. Next HD 06/30/22 3. Anemia of CKD- Hgb 12.9. No indication for ESA. 4. Secondary hyperparathyroidism - Calcium and phos at goal.  Continue VDRA and binders (phoslo). 5. HTN/volume - BP low normal.  Continue midodrine for BP support.  Does not appear volume overloaded. Under edw if weights correct, consider lowering on d/c.  UF as tolerated.  6. Nutrition - Renal diet w/fluid restrictions.  7. Debility/Deconditioning - SNF recommended.    Jen Mow, PA-C [facility] 06/27/2022,10:06 AM  LOS: 6 days

## 2022-06-28 DIAGNOSIS — I10 Essential (primary) hypertension: Secondary | ICD-10-CM | POA: Diagnosis not present

## 2022-06-28 DIAGNOSIS — N186 End stage renal disease: Secondary | ICD-10-CM | POA: Diagnosis not present

## 2022-06-28 DIAGNOSIS — A419 Sepsis, unspecified organism: Secondary | ICD-10-CM | POA: Diagnosis not present

## 2022-06-28 DIAGNOSIS — J9601 Acute respiratory failure with hypoxia: Secondary | ICD-10-CM | POA: Diagnosis not present

## 2022-06-28 MED ORDER — PROSOURCE PLUS PO LIQD
30.0000 mL | Freq: Two times a day (BID) | ORAL | Status: DC
  Administered 2022-06-28 – 2022-06-29 (×2): 30 mL via ORAL
  Filled 2022-06-28 (×2): qty 30

## 2022-06-28 MED ORDER — POLYETHYLENE GLYCOL 3350 17 G PO PACK
17.0000 g | PACK | Freq: Every day | ORAL | Status: DC
  Administered 2022-06-28: 17 g via ORAL
  Filled 2022-06-28 (×2): qty 1

## 2022-06-28 NOTE — Progress Notes (Signed)
Occupational Therapy Treatment Patient Details Name: Jeffrey Payne MRN: [id number] DOB: 1933/02/13 Today's Date: 06/28/2022   History of present illness Pt is an 89 year old man admitted on 2/26 with acute hypoxic respiratory failure in the setting of acute bronchitis. PMH: ESRD, CHF.   OT comments  Pt with improved cognition, but continues to demonstrate slow response speed. Highly focused on feeling like he needs to urinate. Ambulated with min assist to bathroom in attempt to urinate and have BM. Washed hands at sink with min assist. Notified RN of pt's discomfort who came immediately to address. Continues to need ST rehab in SNF.    Recommendations for follow up therapy are one component of a multi-disciplinary discharge planning process, led by the attending physician.  Recommendations may be updated based on patient status, additional functional criteria and insurance authorization.    Follow Up Recommendations  Skilled nursing-short term rehab (<3 hours/day)     Assistance Recommended at Discharge Frequent or constant Supervision/Assistance  Patient can return home with the following  A lot of help with bathing/dressing/bathroom;Assistance with feeding;Direct supervision/assist for medications management;Direct supervision/assist for financial management;Assist for transportation;Help with stairs or ramp for entrance;Assistance with cooking/housework;A little help with walking and/or transfers   Equipment Recommendations  Other (comment) (defer to next venue)    Recommendations for Other Services      Precautions / Restrictions Precautions Precautions: Fall Precaution Comments: monitor sats Restrictions Weight Bearing Restrictions: No       Mobility Bed Mobility Overal bed mobility: Needs Assistance Bed Mobility: Sit to Supine       Sit to supine: Min assist   General bed mobility comments: assist for LEs    Transfers Overall transfer level: Needs  assistance Equipment used: 1 person hand held assist Transfers: Sit to/from Stand Sit to Stand: Min assist           General transfer comment: assist to rise and steady     Balance Overall balance assessment: Needs assistance Sitting-balance support: Feet supported Sitting balance-Leahy Scale: Fair       Standing balance-Leahy Scale: Poor                             ADL either performed or assessed with clinical judgement   ADL Overall ADL's : Needs assistance/impaired     Grooming: Wash/dry hands;Standing;Minimal assistance                   Toilet Transfer: Minimal assistance;Ambulation   Toileting- Clothing Manipulation and Hygiene: Total assistance;Sit to/from stand       Functional mobility during ADLs: Minimal assistance General ADL Comments: Pt reports he feels like he needs to urinate. Ambulated to bathroom with pt neither urinating nor having BM, although with BM smear when wiped.    Extremity/Trunk Assessment              Vision       Perception     Praxis      Cognition Arousal/Alertness: Awake/alert Behavior During Therapy: Flat affect Overall Cognitive Status: Impaired/Different from baseline Area of Impairment: Safety/judgement, Problem solving, Following commands, Attention                   Current Attention Level: Selective Memory: Decreased short-term memory Following Commands: Follows one step commands inconsistently Safety/Judgement: Decreased awareness of safety, Decreased awareness of deficits   Problem Solving: Slow processing, Decreased initiation, Difficulty sequencing  Exercises      Shoulder Instructions       General Comments      Pertinent Vitals/ Pain       Pain Assessment Pain Assessment: Faces Faces Pain Scale: Hurts little more Pain Location: lower abdomen Pain Descriptors / Indicators: Discomfort Pain Intervention(s): Other (comment) (RN made aware, pt feels like he  needs to urinate)  Home Living                                          Prior Functioning/Environment              Frequency  Min 2X/week        Progress Toward Goals  OT Goals(current goals can now be found in the care plan section)  Progress towards OT goals: Progressing toward goals  Acute Rehab OT Goals OT Goal Formulation: With family Time For Goal Achievement: 07/08/22 Potential to Achieve Goals: Good  Plan Discharge plan remains appropriate    Co-evaluation                 AM-PAC OT "6 Clicks" Daily Activity     Outcome Measure   Help from another person eating meals?: A Little Help from another person taking care of personal grooming?: A Little Help from another person toileting, which includes using toliet, bedpan, or urinal?: A Lot Help from another person bathing (including washing, rinsing, drying)?: A Lot Help from another person to put on and taking off regular upper body clothing?: A Little Help from another person to put on and taking off regular lower body clothing?: A Lot 6 Click Score: 15    End of Session Equipment Utilized During Treatment: Oxygen;Gait belt  OT Visit Diagnosis: Muscle weakness (generalized) (M62.81);Other symptoms and signs involving cognitive function   Activity Tolerance Patient tolerated treatment well   Patient Left in bed;with call bell/phone within reach;with nursing/sitter in room;with family/visitor present   Nurse Communication Other (comment) (pt with bladder discomfort)        Time: [id number] OT Time Calculation (min): 27 min  Charges: OT General Charges $OT Visit: 1 Visit OT Treatments $Self Care/Home Management : 23-37 mins  Cleta Alberts, OTR/L Acute Rehabilitation Services Office: [phone]   Malka So 06/28/2022, 11:22 AM

## 2022-06-28 NOTE — TOC Progression Note (Addendum)
Transition of Care [facility]) - Progression Note    Patient Details  Name: Jeffrey Payne MRN: [id number] Date of Birth: 1932-11-11  Transition of Care [facility]) CM/SW [place], LCSW Phone Number: 06/28/2022, 8:28 AM  Clinical Narrative:    8:28am-CSW expanded SNF bed search. Therapy to see patient today for insurance authorization process.   11:30am-CSW presented SNF bed offers to spouse and daughter at bedside. They have selected [facility]. Helene Kelp is checking to make sure they can transport to [facility].  2:45pm-[facility] is able to accept patient. CSW started insurance authorization (Ref# [id number]) and requested patient be on morning HD schedule for tomorrow if possible. Wife updated.   Expected Discharge Plan: Skilled Nursing Facility Barriers to Discharge: Ship broker, SNF Pending bed offer  Expected Discharge Plan and Services In-house Referral: Clinical Social Work Discharge Planning Services: CM Consult Post Acute Care Choice: Home Health, Dialysis, Durable Medical Equipment Living arrangements for the past 2 months: [place]                 DME Arranged: Youth worker wheelchair with seat cushion DME Agency: [employer] Date DME Agency Contacted: 06/25/22 Time DME Agency Contacted: 1157 Representative spoke with at DME Agency: [place]: PT, OT [place] Agency:  Secondary school teacher in conjunction with [employer]) Date [place]: 06/25/22 Time [place]: 1158 Representative spoke with at [place]: [place]) Interventions SDOH Screenings   Food Insecurity: No Food Insecurity (06/15/2022)  Housing: Low Risk  (06/15/2022)  Transportation Needs: No Transportation Needs (06/15/2022)  Utilities: Not At Risk (06/15/2022)  Tobacco Use: Low Risk  (06/21/2022)    Readmission Risk Interventions     No data to display

## 2022-06-28 NOTE — Progress Notes (Signed)
Physical Therapy Treatment Patient Details Name: Jeffrey Payne MRN: [id number] DOB: 1932-08-26 Today's Date: 06/28/2022   History of Present Illness Pt is an 89 year old man admitted on 2/26 with acute hypoxic respiratory failure in the setting of acute bronchitis. PMH: ESRD, CHF.    PT Comments    Pt was seen for mobility session to increase his tolerance for gait and observe sats with activity.  Pt is desaturating for short times, but is not without his observed tolerance to move.  Worked to sit on [facility], then to walk and get to chair.  Pt is motivated to work and discussed his progress with his wife.  Follow acutely for his goals of PT and progress distances, monitor sats and encourage short standing rests if possible to increase distances before he drops sats.  Encourage chair sitting for endurance and promote his strength with movement of LE's as tolerated.  Pt is quite distracted by feeling he needs to void, so contacted nursing to explain the MD's thought process for not using a cath yet.   Recommendations for follow up therapy are one component of a multi-disciplinary discharge planning process, led by the attending physician.  Recommendations may be updated based on patient status, additional functional criteria and insurance authorization.  Follow Up Recommendations  Skilled nursing-short term rehab (<3 hours/day) Can patient physically be transported by private vehicle: No   Assistance Recommended at Discharge Frequent or constant Supervision/Assistance  Patient can return home with the following A little help with walking and/or transfers;A little help with bathing/dressing/bathroom;Assistance with cooking/housework;Assist for transportation;Help with stairs or ramp for entrance   Equipment Recommendations  None recommended by PT    Recommendations for Other Services       Precautions / Restrictions Precautions Precautions: Fall Precaution Comments: monitor  sats Restrictions Weight Bearing Restrictions: No     Mobility  Bed Mobility Overal bed mobility: Needs Assistance Bed Mobility: Supine to Sit       Sit to supine: Min assist   General bed mobility comments: for trunk support    Transfers Overall transfer level: Needs assistance Equipment used: 1 person hand held assist Transfers: Sit to/from Stand Sit to Stand: Min assist                Ambulation/Gait Ambulation/Gait assistance: Min guard, Min assist Gait Distance (Feet): 38 Feet Assistive device: Rolling walker (2 wheels), 1 person hand held assist Gait Pattern/deviations: Step-through pattern, Decreased stride length, Wide base of support Gait velocity: reduced Gait velocity interpretation: <1.31 ft/sec, indicative of household ambulator Pre-gait activities: standing balance and sat ck General Gait Details: widening BOS and taking steps in his room with RW and PT to direct around obstacles   Stairs             Wheelchair Mobility    Modified Rankin (Stroke Patients Only)       Balance Overall balance assessment: Needs assistance Sitting-balance support: Feet supported Sitting balance-Leahy Scale: Fair     Standing balance support: Bilateral upper extremity supported, During functional activity Standing balance-Leahy Scale: Poor                              Cognition Arousal/Alertness: Awake/alert Behavior During Therapy: Flat affect Overall Cognitive Status: Impaired/Different from baseline Area of Impairment: Problem solving, Safety/judgement, Attention                 Orientation Level: Situation Current Attention Level: Selective  Memory: Decreased short-term memory Following Commands: Follows one step commands with increased time Safety/Judgement: Decreased awareness of deficits Awareness: Intellectual Problem Solving: Slow processing General Comments: pt is hyperfocused on his bladder, which is at less than 300  cc's per nsg        Exercises      General Comments General comments (skin integrity, edema, etc.): pt is maintaining sats initially to stand and move but then had drops for brief times down to 84% but quick recovery to 90% range      Pertinent Vitals/Pain Pain Assessment Pain Assessment: Faces Faces Pain Scale: Hurts even more Pain Location: lower abdomen Pain Descriptors / Indicators: Squeezing Pain Intervention(s): Monitored during session, Repositioned, Other (comment) (contacted nursing)    Home Living                          Prior Function            PT Goals (current goals can now be found in the care plan section) Progress towards PT goals: Progressing toward goals    Frequency    Min 3X/week      PT Plan Current plan remains appropriate    Co-evaluation              AM-PAC PT "6 Clicks" Mobility   Outcome Measure  Help needed turning from your back to your side while in a flat bed without using bedrails?: A Lot Help needed moving from lying on your back to sitting on the side of a flat bed without using bedrails?: A Little Help needed moving to and from a bed to a chair (including a wheelchair)?: A Little Help needed standing up from a chair using your arms (e.g., wheelchair or bedside chair)?: A Little Help needed to walk in hospital room?: A Little Help needed climbing 3-5 steps with a railing? : Total 6 Click Score: 15    End of Session Equipment Utilized During Treatment: Gait belt;Oxygen Activity Tolerance: Patient limited by fatigue;Treatment limited secondary to medical complications (Comment) Patient left: in chair;with call bell/phone within reach;with chair alarm set Nurse Communication: Mobility status PT Visit Diagnosis: Unsteadiness on feet (R26.81);Muscle weakness (generalized) (M62.81);Difficulty in walking, not elsewhere classified (R26.2)     Time: [id number] PT Time Calculation (min) (ACUTE ONLY): 25  min  Charges:  $Gait Training: 8-22 mins $Therapeutic Activity: 8-22 mins        Ramond Dial 06/28/2022, 2:46 PM  Mee Hives, PT PhD Acute Rehab Dept. Number: [place]

## 2022-06-28 NOTE — Progress Notes (Signed)
[facility] Progress Note   Subjective: Seen in room. No specific complaints. Wife at bedside. He is worried about his AVF as dressing was left on over W/E. AVF + T/B.   Objective Vitals:   06/27/22 2216 06/28/22 0308 06/28/22 0500 06/28/22 0700  BP: (!) 137/41 (!) 104/43  (!) 121/50  Pulse: 89 71  86  Resp:  20  (!) 23  Temp:  98.4 F (36.9 C)  97.6 F (36.4 C)  TempSrc:  Oral  Oral  SpO2:  93%  93%  Weight:   49.7 kg   Height:       Physical Exam General: Very elderly frail appearing male in NAD Heart: S1,S2 RRR SR on monitor. No M/R/G Abdomen: NABS NT Extremities: No LE edema Dialysis Access: R AVF +T/B shrill bruit upper portion on AVF     Additional Objective Labs: Basic Metabolic Panel: Recent Labs  Lab 06/25/22 0751 06/26/22 0704 06/26/22 0832  NA 137 135 137  K 3.7 3.8 3.9  CL 97* 95* 98  CO2 '30 24 26  '$ GLUCOSE 105* 91 87  BUN 45* 57* 56*  CREATININE 4.88* 5.76* 5.78*  CALCIUM 9.2 9.5 9.3  PHOS 2.9 3.2 3.2   Liver Function Tests: Recent Labs  Lab 06/22/22 0340 06/23/22 0254 06/25/22 0751 06/26/22 0704 06/26/22 0832  AST 15  --   --   --   --   ALT 10  --   --   --   --   ALKPHOS 102  --   --   --   --   BILITOT 1.8*  --   --   --   --   PROT 6.2*  --   --   --   --   ALBUMIN 2.6*   < > 2.4* 2.4* 2.3*   < > = values in this interval not displayed.   No results for input(s): "LIPASE", "AMYLASE" in the last 168 hours. CBC: Recent Labs  Lab 06/22/22 0340 06/22/22 0351 06/23/22 1125 06/24/22 0433 06/25/22 0751 06/26/22 0701 06/26/22 0832  WBC 33.2*   < > 17.5* 4.5 10.9* 8.7 9.1  NEUTROABS 31.2*  --  14.7*  --   --   --   --   HGB 13.0   < > 12.8* 13.0 12.7* 13.1 12.9*  HCT 40.0   < > 38.5* 38.0* 38.3* 39.2 38.8*  MCV 102.6*   < > 99.7 99.0 99.2 99.0 100.5*  PLT 227   < > 193 202 178 197 202   < > = values in this interval not displayed.   Blood Culture    Component Value Date/Time   SDES BLOOD LEFT HAND 06/24/2022  1230   SPECREQUEST  06/24/2022 1230    BOTTLES DRAWN AEROBIC AND ANAEROBIC Blood Culture adequate volume   CULT  06/24/2022 1230    NO GROWTH 4 DAYS Performed at [facility], [place] [address], [place], [place] [address]    REPTSTATUS PENDING 06/24/2022 1230    Cardiac Enzymes: No results for input(s): "CKTOTAL", "CKMB", "CKMBINDEX", "TROPONINI" in the last 168 hours. CBG: No results for input(s): "GLUCAP" in the last 168 hours. Iron Studies: No results for input(s): "IRON", "TIBC", "TRANSFERRIN", "FERRITIN" in the last 72 hours. '@lablastinr3'$ @ Studies/Results: No results found. Medications:   calcitRIOL  0.5 mcg Oral Q T,Th,Sa-HD   calcium acetate  667 mg Oral TID WC   Chlorhexidine Gluconate Cloth  6 each Topical Q0600   feeding supplement (NEPRO  CARB STEADY)  237 mL Oral TID BM   heparin injection (subcutaneous)  5,000 Units Subcutaneous Q8H   loratadine  10 mg Oral Daily   metoprolol tartrate  12.5 mg Oral BID   midodrine  10 mg Oral TID WC   polyethylene glycol  17 g Oral Daily   tamsulosin  0.4 mg Oral Daily     Dialysis Orders: [place].  TTS.  F160.  Flow rates: 400/autoflow 1.5.  EDW 52.5 kg.  3 hours.  2K/2 calcium.   Right forearm AVF.   Meds: Calcitriol 0.5 mcg q. treatment   Assessment/Plan: 1. Sepsis/Acute hypoxic respiratory failure d/t bronchitis/virus - Improved. No evidence of pulmonary edema. Concern for possible bacteremia but considered contaminant.  Off ABX.  2. ESRD - TTS HD. Next HD 06/29/22 3. Anemia of CKD- Hgb 12.9. No indication for ESA. 4. Secondary hyperparathyroidism - Calcium and phos at goal.  Continue VDRA and binders (phoslo). 5. HTN/volume - BP low normal.  Continue midodrine for BP support.  Does not appear volume overloaded. Very much under OP EDW. Lower EDW on discharge. UF as tolerated.  6. Nutrition - Very low albumin. Add protein supplements. Renal diet w/fluid restrictions.  7. Debility/Deconditioning - SNF recommended.   8.  GOC-Pt is DNR. Would recommend Palliative Care consult if re-admitted.   Bev Drennen H. Eustacia Urbanek NP-C 06/28/2022, 12:30 PM  [employer] [phone]

## 2022-06-28 NOTE — Progress Notes (Signed)
Mobility Specialist Progress Note   06/28/22 1215  Mobility  Activity Ambulated with assistance in room;Ambulated with assistance to bathroom  Level of Assistance Minimal assist, patient does 75% or more  Assistive Device Front wheel walker  Distance Ambulated (ft) 20 ft  Range of Motion/Exercises Active;All extremities  Activity Response Tolerated well   Patient received in supine, requesting assistance to bathroom to attempt BM. Required minimal HHA for bed mobility and stood with min A + cues for hand placement. Ambulated with close min guard to min A for safety. Was not successful with BM, deferred sitting in recliner and requested to get back in bed. Tolerated without complaint or incident. Was left in supine with all needs met, call bell in reach.    [place] Aurelius Gildersleeve, BS EXP Mobility Specialist Please contact via SecureChat or Rehab office at [phone]

## 2022-06-28 NOTE — Progress Notes (Signed)
Contacted by CSW to be advised that pt will d/c to snf hopefully tomorrow. Contacted inpt HD unit to request HD first shift tomorrow if possible due to possible d/c to snf after HD. SNF can accommodate pt's HD clinic and schedule per CSW. Will assist as needed.   Melven Sartorius Renal Navigator [phone]

## 2022-06-28 NOTE — Progress Notes (Signed)
PROGRESS NOTE        PATIENT DETAILS Name: Jeffrey Payne Age: 89 y.o. Sex: male Date of Birth: 07/20/1932 Admit Date: 06/21/2022 Admitting Physician Rhetta Mura, DO [id number], Christoper Fabian, MD  Brief Summary: Patient is a 89 y.o.  male w hx of chronic HFpEF/HFrEF, ESRD on HD TTS who presented to the hospital with acute hypoxic respiratory failure in the setting of acute bronchitis due to [place] virus.  Significant events: 2/19-2/20>> hospitalization for hypoxia from decompensated heart failure/Metapneumonia virus. 2/26>> admit to [facility]  Significant studies: 2/26>> CTA chest: No PE/no obvious PNA 2/26>> CT head: No acute intracranial process. 2/28>> echo: EF [id number] RV systolic function moderately reduced.  Significant microbiology data: 2/19>> respiratory virus panel:+ve Metapneumonia virus 2/26>> COVID/influenza/RSV PCR: Negative 2/26>> blood culture: Aerococcus Viridans 2/29>> blood culture: No growth  Procedures: None  Consults: Renal  Subjective: Lying comfortably in bed-no family at bedside.  No major issues overnight.  Awaiting SNF.  Objective: Vitals: Blood pressure (!) 121/50, pulse 86, temperature 97.6 F (36.4 C), temperature source Oral, resp. rate (!) 23, height '5\' 7"'$  (1.702 m), weight 49.7 kg, SpO2 93 %.   Gen Exam:not in any distress HEENT:atraumatic, normocephalic Chest: B/L clear to auscultation anteriorly CVS:S1S2 regular Abdomen:soft non tender, non distended Extremities:no edema Neurology: Non focal Skin: no rash  Pertinent Labs/Radiology:    Latest Ref Rng & Units 06/26/2022    8:32 AM 06/26/2022    7:01 AM 06/25/2022    7:51 AM  CBC  WBC 4.0 - 10.5 K/uL 9.1  8.7  10.9   Hemoglobin 13.0 - 17.0 g/dL 12.9  13.1  12.7   Hematocrit 39.0 - 52.0 % 38.8  39.2  38.3   Platelets 150 - 400 K/uL 202  197  178     Lab Results  Component Value Date   NA 137 06/26/2022   K 3.9 06/26/2022   CL 98 06/26/2022   CO2 26  06/26/2022      Assessment/Plan: Severe sepsis  Acute hypoxic respiratory failure due to bronchitis in the setting of recent Meta pneumonia virus Sepsis physiology has resolved Culture data as above Room air this morning Continue bronchodilators and other supportive care Mobilize/pulmonary tolerating is much as possible All antibiotics were discontinued-2/29-Aerococcus viridans and blood culture is considered to be a contaminant.  Repeat blood cultures negative so far.  Delirium Slightly confused on 2/28 Much better-remains awake/alert this morning. Maintain delirium precautions.  ESRD on HD TTS Nephrology following  Chronic HFrEF Volume status stable Volume removal with HD  HTN Tolerating low-dose beta-blocker Remains on midodrine.    Hx of PAF/MAT/SVT Continue low-dose beta-blocker Reveiwed prior cards note-not a candidate for anticoag  Debility/deconditioning Due to acute illness Recommendations are for SNF-for spouse was initially contemplating taking home with home health given bed prior experience with SNF, however on 3/2-she now wants to revisit other SNF's.  BMI: Estimated body mass index is 17.16 kg/m as calculated from the following:   Height as of this encounter: '5\' 7"'$  (1.702 m).   Weight as of this encounter: 49.7 kg.   Code status:   Code Status: DNR (Addendum:d/w spouse at bedside, she was to continue with treatment-but does not want CPR/Intubation-she is agreeable w a DNR order)  DVT Prophylaxis: heparin injection 5,000 Units Start: 06/26/22 1400 SCDs Start: 06/21/22 1945   Family Communication: Spouse at  bedside on 3/2   Disposition Plan: Status is: Inpatient Remains inpatient appropriate because: Severity of illness   Planned Discharge Destination: SNF-medically stable for discharge once bed available.    Diet: Diet Order             Diet renal with fluid restriction Room service appropriate? No; Fluid consistency: Thin  Diet  effective now                     Antimicrobial agents: Anti-infectives (From admission, onward)    Start     Dose/Rate Route Frequency Ordered Stop   06/23/22 1200  vancomycin (VANCOREADY) IVPB 750 mg/150 mL  Status:  Discontinued        750 mg 150 mL/hr over 60 Minutes Intravenous  Once 06/23/22 1112 06/23/22 1124   06/22/22 2200  ceFEPIme (MAXIPIME) 1 g in sodium chloride 0.9 % 100 mL IVPB  Status:  Discontinued        1 g 200 mL/hr over 30 Minutes Intravenous Every 24 hours 06/21/22 1656 06/22/22 0942   06/22/22 1800  vancomycin (VANCOREADY) IVPB 750 mg/150 mL  Status:  Discontinued        750 mg 150 mL/hr over 60 Minutes Intravenous Every T-Th-Sa (1800) 06/22/22 1003 06/24/22 1106   06/21/22 1445  ceFEPIme (MAXIPIME) 1 g in sodium chloride 0.9 % 100 mL IVPB        1 g 200 mL/hr over 30 Minutes Intravenous  Once 06/21/22 1432 06/21/22 1524   06/21/22 1445  vancomycin (VANCOREADY) IVPB 1500 mg/300 mL        1,500 mg 150 mL/hr over 120 Minutes Intravenous  Once 06/21/22 1432 06/21/22 1734   06/21/22 1435  vancomycin variable dose per unstable renal function (pharmacist dosing)  Status:  Discontinued         Does not apply See admin instructions 06/21/22 1435 06/22/22 1003   06/21/22 1430  ceFEPIme (MAXIPIME) 1 g in sodium chloride 0.9 % 100 mL IVPB  Status:  Discontinued        1 g 200 mL/hr over 30 Minutes Intravenous  Once 06/21/22 1416 06/21/22 1432   06/21/22 1430  metroNIDAZOLE (FLAGYL) IVPB 500 mg        500 mg 100 mL/hr over 60 Minutes Intravenous  Once 06/21/22 1416 06/21/22 1636   06/21/22 1430  vancomycin (VANCOCIN) IVPB 1000 mg/200 mL premix  Status:  Discontinued        1,000 mg 200 mL/hr over 60 Minutes Intravenous  Once 06/21/22 1416 06/21/22 1432        MEDICATIONS: Scheduled Meds:  calcitRIOL  0.5 mcg Oral Q T,Th,Sa-HD   calcium acetate  667 mg Oral TID WC   Chlorhexidine Gluconate Cloth  6 each Topical Q0600   feeding supplement (NEPRO CARB STEADY)   237 mL Oral TID BM   heparin injection (subcutaneous)  5,000 Units Subcutaneous Q8H   loratadine  10 mg Oral Daily   metoprolol tartrate  12.5 mg Oral BID   midodrine  10 mg Oral TID WC   tamsulosin  0.4 mg Oral Daily   Continuous Infusions:    PRN Meds:.acetaminophen **OR** acetaminophen, albuterol, ALPRAZolam, benzonatate, HYDROcodone-acetaminophen, ipratropium-albuterol, melatonin   I have personally reviewed following labs and imaging studies  LABORATORY DATA: CBC: Recent Labs  Lab 06/21/22 1215 06/22/22 0340 06/22/22 0351 06/23/22 1125 06/24/22 0433 06/25/22 0751 06/26/22 0701 06/26/22 0832  WBC 24.4* 33.2*   < > 17.5* 4.5 10.9* 8.7 9.1  NEUTROABS 22.7* 31.2*  --  14.7*  --   --   --   --   HGB 14.7 13.0   < > 12.8* 13.0 12.7* 13.1 12.9*  HCT 44.5 40.0   < > 38.5* 38.0* 38.3* 39.2 38.8*  MCV 102.5* 102.6*   < > 99.7 99.0 99.2 99.0 100.5*  PLT 244 227   < > 193 202 178 197 202   < > = values in this interval not displayed.     Basic Metabolic Panel: Recent Labs  Lab 06/21/22 1215 06/21/22 2245 06/22/22 0340 06/22/22 0351 06/23/22 0254 06/24/22 0433 06/25/22 0751 06/26/22 0700 06/26/22 0704 06/26/22 0832  NA  --   --  137   < > 136 137 137  --  135 137  K  --   --  4.3   < > 4.2 4.0 3.7  --  3.8 3.9  CL  --   --  92*  --  96* 97* 97*  --  95* 98  CO2  --   --  22  --  '25 23 30  '$ --  24 26  GLUCOSE  --   --  86  --  82 97 105*  --  91 87  BUN  --   --  39*  --  42* 60* 45*  --  57* 56*  CREATININE  --   --  6.16*  --  5.69* 6.18* 4.88*  --  5.76* 5.78*  CALCIUM  --   --  8.5*  --  8.8* 9.1 9.2  --  9.5 9.3  MG  --  1.9 1.7  --   --   --   --  2.1  --   --   PHOS   < >  --  5.2*  --  6.1* 4.8* 2.9  --  3.2 3.2   < > = values in this interval not displayed.     GFR: Estimated Creatinine Clearance: 6.3 mL/min (A) (by C-G formula based on SCr of 5.78 mg/dL (H)).  Liver Function Tests: Recent Labs  Lab 06/21/22 1215 06/22/22 0340 06/23/22 0254  06/24/22 0433 06/25/22 0751 06/26/22 0704 06/26/22 0832  AST 16 15  --   --   --   --   --   ALT 9 10  --   --   --   --   --   ALKPHOS 112 102  --   --   --   --   --   BILITOT 1.9* 1.8*  --   --   --   --   --   PROT 6.6 6.2*  --   --   --   --   --   ALBUMIN 3.0* 2.6* 2.3* 2.4* 2.4* 2.4* 2.3*    No results for input(s): "LIPASE", "AMYLASE" in the last 168 hours. Recent Labs  Lab 06/22/22 0340  AMMONIA 33     Coagulation Profile: Recent Labs  Lab 06/22/22 0340  INR 1.2     Cardiac Enzymes: No results for input(s): "CKTOTAL", "CKMB", "CKMBINDEX", "TROPONINI" in the last 168 hours.  BNP (last 3 results) No results for input(s): "PROBNP" in the last 8760 hours.  Lipid Profile: No results for input(s): "CHOL", "HDL", "LDLCALC", "TRIG", "CHOLHDL", "LDLDIRECT" in the last 72 hours.  Thyroid Function Tests: No results for input(s): "TSH", "T4TOTAL", "FREET4", "T3FREE", "THYROIDAB" in the last 72 hours.   Anemia Panel: No results for input(s): "VITAMINB12", "FOLATE", "FERRITIN", "TIBC", "IRON", "RETICCTPCT" in the last  72 hours.   Urine analysis:    Component Value Date/Time   COLORURINE YELLOW 04/06/2022 [place] 04/06/2022 1554   LABSPEC 1.010 04/06/2022 1554   PHURINE 7.5 04/06/2022 1554   GLUCOSEU NEGATIVE 04/06/2022 1554   HGBUR TRACE (A) 04/06/2022 1554   BILIRUBINUR NEGATIVE 04/06/2022 1554   KETONESUR NEGATIVE 04/06/2022 1554   PROTEINUR 100 (A) 04/06/2022 1554   NITRITE NEGATIVE 04/06/2022 1554   LEUKOCYTESUR NEGATIVE 04/06/2022 1554    Sepsis Labs: Lactic Acid, Venous    Component Value Date/Time   LATICACIDVEN 1.1 06/21/2022 1508    MICROBIOLOGY: Recent Results (from the past 240 hour(s))  Blood Culture (routine x 2)     Status: Abnormal   Collection Time: 06/21/22 12:15 PM   Specimen: BLOOD  Result Value Ref Range Status   Specimen Description BLOOD LEFT ANTECUBITAL  Final   Special Requests   Final    BOTTLES DRAWN  AEROBIC AND ANAEROBIC Blood Culture adequate volume   Culture  Setup Time   Final    GRAM POSITIVE COCCI IN BOTH AEROBIC AND ANAEROBIC BOTTLES Organism ID to follow CRITICAL RESULT CALLED TO, READ BACK BY AND VERIFIED WITH: Ezekiel Slocumb PHARMD, AT [id number] 06/22/22 D. VANHOOK    Culture (A)  Final    AEROCOCCUS VIRIDANS Standardized susceptibility testing for this organism is not available. Performed at [facility], [place] [address] [place], [place] [address]    Report Status 06/24/2022 FINAL  Final  Resp panel by RT-PCR (RSV, Flu A&B, Covid) Anterior Nasal Swab     Status: None   Collection Time: 06/21/22 12:15 PM   Specimen: Anterior Nasal Swab  Result Value Ref Range Status   SARS Coronavirus 2 by RT PCR NEGATIVE NEGATIVE Final   Influenza A by PCR NEGATIVE NEGATIVE Final   Influenza B by PCR NEGATIVE NEGATIVE Final    Comment: (NOTE) The Xpert Xpress SARS-CoV-2/FLU/RSV plus assay is intended as an aid in the diagnosis of influenza from Nasopharyngeal swab specimens and should not be used as a sole basis for treatment. Nasal washings and aspirates are unacceptable for Xpert Xpress SARS-CoV-2/FLU/RSV testing.  Fact Sheet for Patients: EntrepreneurPulse.com.au  Fact Sheet for Healthcare Providers: IncredibleEmployment.be  This test is not yet approved or cleared by the [place] FDA and has been authorized for detection and/or diagnosis of SARS-CoV-2 by FDA under an Emergency Use Authorization (EUA). This EUA will remain in effect (meaning this test can be used) for the duration of the COVID-19 declaration under Section 564(b)(1) of the Act, 21 U.S.C. section 360bbb-3(b)(1), unless the authorization is terminated or revoked.     Resp Syncytial Virus by PCR NEGATIVE NEGATIVE Final    Comment: (NOTE) Fact Sheet for Patients: EntrepreneurPulse.com.au  Fact Sheet for Healthcare  Providers: IncredibleEmployment.be  This test is not yet approved or cleared by the [place] FDA and has been authorized for detection and/or diagnosis of SARS-CoV-2 by FDA under an Emergency Use Authorization (EUA). This EUA will remain in effect (meaning this test can be used) for the duration of the COVID-19 declaration under Section 564(b)(1) of the Act, 21 U.S.C. section 360bbb-3(b)(1), unless the authorization is terminated or revoked.  Performed at [facility], [place] [address], [place], [place] [address]   Blood Culture ID Panel (Reflexed)     Status: None   Collection Time: 06/21/22 12:15 PM  Result Value Ref Range Status   Enterococcus faecalis NOT DETECTED NOT DETECTED Final   Enterococcus Faecium  NOT DETECTED NOT DETECTED Final   Listeria monocytogenes NOT DETECTED NOT DETECTED Final   Staphylococcus species NOT DETECTED NOT DETECTED Final   Staphylococcus aureus (BCID) NOT DETECTED NOT DETECTED Final   Staphylococcus epidermidis NOT DETECTED NOT DETECTED Final   Staphylococcus lugdunensis NOT DETECTED NOT DETECTED Final   Streptococcus species NOT DETECTED NOT DETECTED Final   Streptococcus agalactiae NOT DETECTED NOT DETECTED Final   Streptococcus pneumoniae NOT DETECTED NOT DETECTED Final   Streptococcus pyogenes NOT DETECTED NOT DETECTED Final   A.calcoaceticus-baumannii NOT DETECTED NOT DETECTED Final   Bacteroides fragilis NOT DETECTED NOT DETECTED Final   Enterobacterales NOT DETECTED NOT DETECTED Final   Enterobacter cloacae complex NOT DETECTED NOT DETECTED Final   Escherichia coli NOT DETECTED NOT DETECTED Final   Klebsiella aerogenes NOT DETECTED NOT DETECTED Final   Klebsiella oxytoca NOT DETECTED NOT DETECTED Final   Klebsiella pneumoniae NOT DETECTED NOT DETECTED Final   Proteus species NOT DETECTED NOT DETECTED Final   Salmonella species NOT DETECTED NOT DETECTED Final   Serratia marcescens NOT DETECTED NOT DETECTED Final    Haemophilus influenzae NOT DETECTED NOT DETECTED Final   Neisseria meningitidis NOT DETECTED NOT DETECTED Final   Pseudomonas aeruginosa NOT DETECTED NOT DETECTED Final   Stenotrophomonas maltophilia NOT DETECTED NOT DETECTED Final   Candida albicans NOT DETECTED NOT DETECTED Final   Candida auris NOT DETECTED NOT DETECTED Final   Candida glabrata NOT DETECTED NOT DETECTED Final   Candida krusei NOT DETECTED NOT DETECTED Final   Candida parapsilosis NOT DETECTED NOT DETECTED Final   Candida tropicalis NOT DETECTED NOT DETECTED Final   Cryptococcus neoformans/gattii NOT DETECTED NOT DETECTED Final    Comment: Performed at [facility], [address] [place], [place] [address]  MRSA Next Gen by PCR, Nasal     Status: None   Collection Time: 06/21/22  4:53 PM   Specimen: Nasal Mucosa; Nasal Swab  Result Value Ref Range Status   MRSA by PCR Next Gen NOT DETECTED NOT DETECTED Final    Comment: (NOTE) The GeneXpert MRSA Assay (FDA approved for NASAL specimens only), is one component of a comprehensive MRSA colonization surveillance program. It is not intended to diagnose MRSA infection nor to guide or monitor treatment for MRSA infections. Test performance is not FDA approved in patients less than 23 years old. Performed at [facility], [place] [address] [place], [place] [address]   Culture, blood (Routine X 2) w Reflex to ID Panel     Status: None (Preliminary result)   Collection Time: 06/24/22 12:21 PM   Specimen: BLOOD LEFT HAND  Result Value Ref Range Status   Specimen Description BLOOD LEFT HAND  Final   Special Requests   Final    BOTTLES DRAWN AEROBIC ONLY Blood Culture adequate volume   Culture   Final    NO GROWTH 4 DAYS Performed at [facility], [place] [address], [place], [place] [address]    Report Status PENDING  Incomplete  Culture, blood (Routine X 2) w Reflex to ID Panel     Status: None (Preliminary result)   Collection Time: 06/24/22 12:30 PM    Specimen: BLOOD LEFT HAND  Result Value Ref Range Status   Specimen Description BLOOD LEFT HAND  Final   Special Requests   Final    BOTTLES DRAWN AEROBIC AND ANAEROBIC Blood Culture adequate volume   Culture   Final    NO GROWTH 4 DAYS Performed at [facility], [place] [address], [place], [place] [address]    Report Status PENDING  Incomplete    RADIOLOGY STUDIES/RESULTS: No results found.   LOS: 7 days   Oren Binet, MD  [facility]    To contact the attending provider between 7A-7P or the covering provider during after hours 7P-7A, please log into the web site [email or web address] and access using universal [place] password for that web site. If you do not have the password, please call the hospital operator.  06/28/2022, 10:12 AM

## 2022-06-29 DIAGNOSIS — G9341 Metabolic encephalopathy: Secondary | ICD-10-CM | POA: Diagnosis not present

## 2022-06-29 DIAGNOSIS — E43 Unspecified severe protein-calorie malnutrition: Secondary | ICD-10-CM | POA: Diagnosis not present

## 2022-06-29 DIAGNOSIS — I5042 Chronic combined systolic (congestive) and diastolic (congestive) heart failure: Secondary | ICD-10-CM | POA: Diagnosis not present

## 2022-06-29 DIAGNOSIS — Z7401 Bed confinement status: Secondary | ICD-10-CM | POA: Diagnosis not present

## 2022-06-29 DIAGNOSIS — I10 Essential (primary) hypertension: Secondary | ICD-10-CM | POA: Diagnosis not present

## 2022-06-29 DIAGNOSIS — Z992 Dependence on renal dialysis: Secondary | ICD-10-CM | POA: Diagnosis not present

## 2022-06-29 DIAGNOSIS — Z719 Counseling, unspecified: Secondary | ICD-10-CM | POA: Diagnosis not present

## 2022-06-29 DIAGNOSIS — K219 Gastro-esophageal reflux disease without esophagitis: Secondary | ICD-10-CM | POA: Diagnosis not present

## 2022-06-29 DIAGNOSIS — M6281 Muscle weakness (generalized): Secondary | ICD-10-CM | POA: Diagnosis not present

## 2022-06-29 DIAGNOSIS — J209 Acute bronchitis, unspecified: Secondary | ICD-10-CM | POA: Diagnosis not present

## 2022-06-29 DIAGNOSIS — R531 Weakness: Secondary | ICD-10-CM | POA: Diagnosis not present

## 2022-06-29 DIAGNOSIS — R54 Age-related physical debility: Secondary | ICD-10-CM | POA: Diagnosis not present

## 2022-06-29 DIAGNOSIS — J9601 Acute respiratory failure with hypoxia: Secondary | ICD-10-CM | POA: Diagnosis not present

## 2022-06-29 DIAGNOSIS — R6889 Other general symptoms and signs: Secondary | ICD-10-CM | POA: Diagnosis not present

## 2022-06-29 DIAGNOSIS — J8410 Pulmonary fibrosis, unspecified: Secondary | ICD-10-CM | POA: Diagnosis not present

## 2022-06-29 DIAGNOSIS — Z743 Need for continuous supervision: Secondary | ICD-10-CM | POA: Diagnosis not present

## 2022-06-29 DIAGNOSIS — N2581 Secondary hyperparathyroidism of renal origin: Secondary | ICD-10-CM | POA: Diagnosis not present

## 2022-06-29 DIAGNOSIS — D689 Coagulation defect, unspecified: Secondary | ICD-10-CM | POA: Diagnosis not present

## 2022-06-29 DIAGNOSIS — N186 End stage renal disease: Secondary | ICD-10-CM | POA: Diagnosis not present

## 2022-06-29 DIAGNOSIS — R2681 Unsteadiness on feet: Secondary | ICD-10-CM | POA: Diagnosis not present

## 2022-06-29 DIAGNOSIS — I5040 Unspecified combined systolic (congestive) and diastolic (congestive) heart failure: Secondary | ICD-10-CM | POA: Diagnosis not present

## 2022-06-29 DIAGNOSIS — I132 Hypertensive heart and chronic kidney disease with heart failure and with stage 5 chronic kidney disease, or end stage renal disease: Secondary | ICD-10-CM | POA: Diagnosis not present

## 2022-06-29 LAB — CULTURE, BLOOD (ROUTINE X 2)
Culture: NO GROWTH
Culture: NO GROWTH
Special Requests: ADEQUATE
Special Requests: ADEQUATE

## 2022-06-29 LAB — RENAL FUNCTION PANEL
Albumin: 2.7 g/dL — ABNORMAL LOW (ref 3.5–5.0)
Anion gap: 15 (ref 5–15)
BUN: 63 mg/dL — ABNORMAL HIGH (ref 8–23)
CO2: 25 mmol/L (ref 22–32)
Calcium: 9.3 mg/dL (ref 8.9–10.3)
Chloride: 95 mmol/L — ABNORMAL LOW (ref 98–111)
Creatinine, Ser: 5.85 mg/dL — ABNORMAL HIGH (ref 0.61–1.24)
GFR, Estimated: 9 mL/min — ABNORMAL LOW (ref >60)
Glucose, Bld: 90 mg/dL (ref 70–99)
Phosphorus: 3.8 mg/dL (ref 2.5–4.6)
Potassium: 4 mmol/L (ref 3.5–5.1)
Sodium: 135 mmol/L (ref 135–145)

## 2022-06-29 MED ORDER — MIDODRINE HCL 10 MG PO TABS: 10.0000 mg | ORAL_TABLET | Freq: Three times a day (TID) | ORAL | Status: DC

## 2022-06-29 MED ORDER — LIDOCAINE-PRILOCAINE 2.5-2.5 % EX CREA: 1.0000 | TOPICAL_CREAM | CUTANEOUS | Status: DC | PRN

## 2022-06-29 MED ORDER — IPRATROPIUM-ALBUTEROL 0.5-2.5 (3) MG/3ML IN SOLN: 3.0000 mL | Freq: Four times a day (QID) | RESPIRATORY_TRACT | Status: DC | PRN

## 2022-06-29 MED ORDER — LIDOCAINE HCL (PF) 1 % IJ SOLN: 5.0000 mL | INTRAMUSCULAR | Status: DC | PRN

## 2022-06-29 MED ORDER — ANTICOAGULANT SODIUM CITRATE 4% (200MG/5ML) IV SOLN: 5.0000 mL | Status: DC | PRN

## 2022-06-29 MED ORDER — PENTAFLUOROPROP-TETRAFLUOROETH EX AERO: 1.0000 | INHALATION_SPRAY | CUTANEOUS | Status: DC | PRN

## 2022-06-29 MED ORDER — HEPARIN SODIUM (PORCINE) 1000 UNIT/ML DIALYSIS: 1000.0000 [IU] | INTRAMUSCULAR | Status: DC | PRN

## 2022-06-29 MED ORDER — MELATONIN 3 MG PO TABS: 3.0000 mg | ORAL_TABLET | Freq: Every evening | ORAL | 0 refills | Status: DC | PRN

## 2022-06-29 MED ORDER — ALTEPLASE 2 MG IJ SOLR: 2.0000 mg | Freq: Once | INTRAMUSCULAR | Status: DC | PRN

## 2022-06-29 NOTE — Care Management Important Message (Signed)
Important Message  Patient Details  Name: Jeffrey Payne MRN: [id number] Date of Birth: 1933-01-30   Medicare Important Message Given:  Yes  Spoke with the patient wife and will mail the patient copy of IM to the home address   [name] 06/29/2022, 4:26 PM

## 2022-06-29 NOTE — Progress Notes (Signed)
Pt to d/c to snf today. Contacted [facility] to advise clinic of pt's d/c today and that should resume care on Thursday. Clinic also advised of snf name that pt will be admitted to at d/c.   Melven Sartorius Renal Navigator [phone]

## 2022-06-29 NOTE — TOC Transition Note (Signed)
Transition of Care [facility]) - CM/SW Discharge Note   Patient Details  Name: Jeffrey Payne MRN: [id number] Date of Birth: Jul 29, 1932  [facility] CM/SW Contact:  Benard Halsted, LCSW Phone Number: 06/29/2022, 1:41 PM   Clinical Narrative:    Patient will DC to: [facility] Anticipated DC date: 06/29/22 Family notified: Spouse at bedside Transport by: Corey Harold   Per MD patient ready for DC to [facility]. RN to call report prior to discharge [phone] room 311). RN, patient, patient's family, and facility notified of DC. Discharge Summary and FL2 sent to facility. DC packet on chart including signed DNR. Ambulance transport requested for patient.   CSW will sign off for now as social work intervention is no longer needed. Please consult [place] again if new needs arise.     Final next level of care: Skilled Nursing Facility Barriers to Discharge: Barriers Resolved   Patient Goals and CMS Choice CMS Medicare.gov Compare Post Acute Care list provided to:: Patient Represenative (must comment) Choice offered to / list presented to : Spouse, Patient  Discharge Placement     Existing PASRR number confirmed : 06/29/22          Patient chooses bed at: [facility] Patient to be transferred to facility by: [place] Name of family member notified: Wife Patient and family notified of of transfer: 06/29/22  Discharge Plan and Services Additional resources added to the After Visit Summary for   In-house Referral: Clinical Social Work Discharge Planning Services: CM Consult Post Acute Care Choice: Home Health, Dialysis, Durable Medical Equipment          DME Arranged: Youth worker wheelchair with seat cushion DME Agency: [employer] Date DME Agency Contacted: 06/25/22 Time DME Agency Contacted: 1157 Representative spoke with at DME Agency: [place]: PT, OT [place] Agency:  Secondary school teacher in conjunction with [place]) Date [place]:  06/25/22 Time [place]: 1158 Representative spoke with at [place]: [place] ([place]) Interventions SDOH Screenings   Food Insecurity: No Food Insecurity (06/15/2022)  Housing: [place]  (06/15/2022)  Transportation Needs: No Transportation Needs (06/15/2022)  Utilities: Not At Risk (06/15/2022)  Tobacco Use: Low Risk  (06/21/2022)     Readmission Risk Interventions     No data to display

## 2022-06-29 NOTE — Progress Notes (Signed)
Received patient in bed,awake ,alert and oriented x 3. Vital were stable.  Access used : Right lower arm fistula that worked well.  Duration of treatment :  2.10 hrs out 3.5 hours prescribed. Patient quit  on her last 50 minutes of treatment.  Medicines given ; Midodrine 10 mg  as scheduled.  Fluid removed: 700 CC  Hemodialysis issue/comment: None.  Hand off to the patient's nurse.

## 2022-06-29 NOTE — Progress Notes (Signed)
[facility] Progress Note   Subjective: Seen prior to HD. Discharging to SNF today. No C/Os.   Objective Vitals:   06/29/22 0750 06/29/22 0834 06/29/22 0845 06/29/22 0848  BP: (!) 131/53  (!) 127/46 (!) 127/51  Pulse: 70  77 70  Resp: 20  (!) 23 (!) 21  Temp: 97.6 F (36.4 C)  (!) 97.5 F (36.4 C)   TempSrc: Oral  Oral   SpO2: 97%  96% 97%  Weight:  49.7 kg    Height:       Physical Exam General: Very elderly frail appearing male in NAD Heart: S1,S2 RRR SR on monitor. No M/R/G Abdomen: NABS NT Extremities: No LE edema Dialysis Access: R AVF cannulated  Additional Objective Labs: Basic Metabolic Panel: Recent Labs  Lab 06/25/22 0751 06/26/22 0704 06/26/22 0832  NA 137 135 137  K 3.7 3.8 3.9  CL 97* 95* 98  CO2 '30 24 26  '$ GLUCOSE 105* 91 87  BUN 45* 57* 56*  CREATININE 4.88* 5.76* 5.78*  CALCIUM 9.2 9.5 9.3  PHOS 2.9 3.2 3.2   Liver Function Tests: Recent Labs  Lab 06/25/22 0751 06/26/22 0704 06/26/22 0832  ALBUMIN 2.4* 2.4* 2.3*   No results for input(s): "LIPASE", "AMYLASE" in the last 168 hours. CBC: Recent Labs  Lab 06/23/22 1125 06/24/22 0433 06/25/22 0751 06/26/22 0701 06/26/22 0832  WBC 17.5* 4.5 10.9* 8.7 9.1  NEUTROABS 14.7*  --   --   --   --   HGB 12.8* 13.0 12.7* 13.1 12.9*  HCT 38.5* 38.0* 38.3* 39.2 38.8*  MCV 99.7 99.0 99.2 99.0 100.5*  PLT 193 202 178 197 202   Blood Culture    Component Value Date/Time   SDES BLOOD LEFT HAND 06/24/2022 1230   SPECREQUEST  06/24/2022 1230    BOTTLES DRAWN AEROBIC AND ANAEROBIC Blood Culture adequate volume   CULT  06/24/2022 1230    NO GROWTH 5 DAYS Performed at [facility] [address] [place], [place] [address]    REPTSTATUS 06/29/2022 FINAL 06/24/2022 1230    Cardiac Enzymes: No results for input(s): "CKTOTAL", "CKMB", "CKMBINDEX", "TROPONINI" in the last 168 hours. CBG: No results for input(s): "GLUCAP" in the last 168 hours. Iron Studies: No results for  input(s): "IRON", "TIBC", "TRANSFERRIN", "FERRITIN" in the last 72 hours. '@lablastinr3'$ @ Studies/Results: No results found. Medications:  anticoagulant sodium citrate      (feeding supplement) PROSource Plus  30 mL Oral BID BM   calcitRIOL  0.5 mcg Oral Q T,Th,Sa-HD   calcium acetate  667 mg Oral TID WC   Chlorhexidine Gluconate Cloth  6 each Topical Q0600   feeding supplement (NEPRO CARB STEADY)  237 mL Oral TID BM   heparin injection (subcutaneous)  5,000 Units Subcutaneous Q8H   loratadine  10 mg Oral Daily   metoprolol tartrate  12.5 mg Oral BID   midodrine  10 mg Oral TID WC   polyethylene glycol  17 g Oral Daily   tamsulosin  0.4 mg Oral Daily     Dialysis Orders: [place].  TTS.  F160.  Flow rates: 400/autoflow 1.5.  EDW 52.5 kg.  3 hours.  2K/2 calcium.   Right forearm AVF.   Meds: Calcitriol 0.5 mcg q. treatment   Assessment/Plan: 1. Sepsis/Acute hypoxic respiratory failure d/t bronchitis/virus - Improved. No evidence of pulmonary edema. Concern for possible bacteremia but considered contaminant.  Off ABX.  2. ESRD - TTS HD. Next HD 06/29/22 3. Anemia of CKD- Hgb  12.9. No indication for ESA. 4. Secondary hyperparathyroidism - Calcium and phos at goal.  Continue VDRA and binders (phoslo). 5. HTN/volume - BP low normal.  Continue midodrine for BP support.  Does not appear volume overloaded. Very much under OP EDW. Lower EDW on discharge. UF as tolerated.  6. Nutrition - Very low albumin. Add protein supplements. Renal diet w/fluid restrictions.  7. Debility/Deconditioning - Very elderly. SNF recommended.   8. GOC-Pt is DNR. Would recommend Palliative Care consult if re-admitted.   Disposition: Discharge to SNF. F/U at OP HD center 07/01/2022  Jimmye Norman. Jene Oravec NP-C 06/29/2022, 9:16 AM  [employer] [phone]

## 2022-06-29 NOTE — Discharge Summary (Signed)
PATIENT DETAILS Name: Jeffrey Payne Age: 89 y.o. Sex: male Date of Birth: Mar 01, 1933 MRN: [id number]. Admitting Physician: Rhetta Mura, DO [id number], Christoper Fabian, MD  Admit Date: 06/21/2022 Discharge date: 06/29/2022  Recommendations for Outpatient Follow-up:  Follow up with PCP in 1-2 weeks Please obtain CMP/CBC in one week Resume outpatient dialysis as previous.  Admitted From:  ILF  Disposition: Skilled nursing facility   Discharge Condition: fair  CODE STATUS:   Code Status: DNR   Diet recommendation:  Diet Order             Diet - low sodium heart healthy           Diet renal with fluid restriction Room service appropriate? No; Fluid consistency: Thin  Diet effective now                    Brief Summary: Patient is a 89 y.o.  male w hx of chronic HFpEF/HFrEF, ESRD on HD TTS who presented to the hospital with acute hypoxic respiratory failure in the setting of acute bronchitis due to [place] virus.   Significant events: 2/19-2/20>> hospitalization for hypoxia from decompensated heart failure/Metapneumonia virus. 2/26>> admit to [facility]   Significant studies: 2/26>> CTA chest: No PE/no obvious PNA 2/26>> CT head: No acute intracranial process. 2/28>> echo: EF [id number] RV systolic function moderately reduced.   Significant microbiology data: 2/19>> respiratory virus panel:+ve Metapneumonia virus 2/26>> COVID/influenza/RSV PCR: Negative 2/26>> blood culture: Aerococcus Viridans 2/29>> blood culture: No growth   Procedures: None   Consults: Renal  Brief Hospital Course: Severe sepsis  Acute hypoxic respiratory failure due to bronchitis in the setting of recent Meta pneumonia virus Sepsis physiology has resolved Culture data as above Room air this morning Continue bronchodilators and other supportive care Mobilize/pulmonary tolerating is much as possible All antibiotics were discontinued-2/29-Aerococcus viridans and blood culture is  considered to be a contaminant.  Repeat blood cultures negative so far.   Delirium Slightly confused on 2/28 Much better-remains awake/alert this morning. Maintain delirium precautions.   ESRD on HD TTS Nephrology followed closely-resume usual outpatient dialysis schedule.   Chronic HFrEF Volume status stable Volume removal with HD   HTN Tolerating low-dose beta-blocker Remains on midodrine.     Hx of PAF/MAT/SVT Continue low-dose beta-blocker Reveiwed prior cards note-not a candidate for anticoag   Debility/deconditioning Due to acute illness SNF plan on discharge.  Palliative care DNR in place Very frail-long-term prognosis is not that great If he has recurrent hospitalization-or if he decompensates in the near future-May need palliative care evaluation.  Underweight: Estimated body mass index is 17.16 kg/m as calculated from the following:   Height as of this encounter: '5\' 7"'$  (1.702 m).   Weight as of this encounter: 49.7 kg.    Discharge Diagnoses:  Principal Problem:   Acute hypoxic respiratory failure (HCC) Active Problems:   End-stage renal disease on hemodialysis (HCC)   Elevated troponin   Essential hypertension   History of anemia due to chronic kidney disease   Chronic combined systolic and diastolic heart failure (HCC)   GERD (gastroesophageal reflux disease)   Leukocytosis   Acute bronchitis   Shortness of breath   Acute metabolic encephalopathy   Severe sepsis (HCC)   Allergic rhinitis   Discharge Instructions:  Activity:  As tolerated with Full fall precautions use walker/cane & assistance as needed Discharge Instructions     Call MD for:  difficulty breathing, headache or visual disturbances   Complete by: As directed  Diet - low sodium heart healthy   Complete by: As directed    Discharge instructions   Complete by: As directed    Follow with Primary MD  Jodene Nam, MD in 1-2 weeks  Please get a complete blood count and  chemistry panel checked by your Primary MD at your next visit, and again as instructed by your Primary MD.  Get Medicines reviewed and adjusted: Please take all your medications with you for your next visit with your Primary MD  Laboratory/radiological data: Please request your Primary MD to go over all hospital tests and procedure/radiological results at the follow up, please ask your Primary MD to get all Hospital records sent to his/her office.  In some cases, they will be blood work, cultures and biopsy results pending at the time of your discharge. Please request that your primary care M.D. follows up on these results.  Also Note the following: If you experience worsening of your admission symptoms, develop shortness of breath, life threatening emergency, suicidal or homicidal thoughts you must seek medical attention immediately by calling 911 or calling your MD immediately  if symptoms less severe.  You must read complete instructions/literature along with all the possible adverse reactions/side effects for all the Medicines you take and that have been prescribed to you. Take any new Medicines after you have completely understood and accpet all the possible adverse reactions/side effects.   Do not drive when taking Pain medications or sleeping medications (Benzodaizepines)  Do not take more than prescribed Pain, Sleep and Anxiety Medications. It is not advisable to combine anxiety,sleep and pain medications without talking with your primary care practitioner  Special Instructions: If you have smoked or chewed Tobacco  in the last 2 yrs please stop smoking, stop any regular Alcohol  and or any Recreational drug use.  Wear Seat belts while driving.  Please note: You were cared for by a hospitalist during your hospital stay. Once you are discharged, your primary care physician will handle any further medical issues. Please note that NO REFILLS for any discharge medications will be  authorized once you are discharged, as it is imperative that you return to your primary care physician (or establish a relationship with a primary care physician if you do not have one) for your post hospital discharge needs so that they can reassess your need for medications and monitor your lab values.   Increase activity slowly   Complete by: As directed    No dressing needed   Complete by: As directed       Allergies as of 06/29/2022   No Known Allergies      Medication List     STOP taking these medications    benzonatate 100 MG capsule Commonly known as: TESSALON   carboxymethylcellulose 0.5 % Soln Commonly known as: REFRESH PLUS   famotidine 20 MG tablet Commonly known as: PEPCID   fluticasone 50 MCG/ACT nasal spray Commonly known as: FLONASE   lactose free nutrition Liqd       TAKE these medications    acetaminophen 650 MG CR tablet Commonly known as: TYLENOL Take 650 mg by mouth every 8 (eight) hours as needed for pain.   calcium acetate 667 MG tablet Commonly known as: PHOSLO Take 667 mg by mouth 3 (three) times daily with meals.   cetirizine 10 MG tablet Commonly known as: ZYRTEC Take 10 mg by mouth daily.   ipratropium-albuterol 0.5-2.5 (3) MG/3ML Soln Commonly known as: DUONEB Take 3 mLs by  nebulization every 6 (six) hours as needed.   melatonin 3 MG Tabs tablet Take 1 tablet (3 mg total) by mouth at bedtime as needed (insomnia).   metoprolol tartrate 25 MG tablet Commonly known as: LOPRESSOR Take 0.5 tablets (12.5 mg total) by mouth 3 (three) times daily.   midodrine 10 MG tablet Commonly known as: PROAMATINE Take 1 tablet (10 mg total) by mouth 3 (three) times daily with meals.   omeprazole 20 MG tablet Commonly known as: PRILOSEC OTC Take 20 mg by mouth daily.   tamsulosin 0.4 MG Caps capsule Commonly known as: FLOMAX Take 1 capsule (0.4 mg total) by mouth daily after supper.               Durable Medical Equipment  (From  admission, onward)           Start     Ordered   06/25/22 1155  For home use only DME lightweight manual wheelchair with seat cushion  Once       Comments: Patient suffers from Weakness renal failure which impairs their ability to perform daily activities like toileting in the home.  A walker will not resolve  issue with performing activities of daily living. A wheelchair will allow patient to safely perform daily activities. Patient is not able to propel themselves in the home using a standard weight wheelchair due to general weakness. Patient can self propel in the lightweight wheelchair. Length of need Lifetime. Accessories: elevating leg rests (ELRs), wheel locks, extensions and anti-tippers.   06/25/22 1155              Discharge Care Instructions  (From admission, onward)           Start     Ordered   06/29/22 0000  No dressing needed        06/29/22 0845            Contact information for follow-up providers     Jodene Nam, MD. Schedule an appointment as soon as possible for a visit in 1 week(s).   Specialty: Family Medicine Contact information: [place] [address] [phone]         Hemodialysis unit Follow up.   Why: Follow at your usual schedule on Tuesdays, Thursdays and Saturdays             Contact information for after-discharge care     [facility] .   Service: Skilled Nursing Contact information: [id number] [address] [phone]                    No Known Allergies   Other Procedures/Studies: ECHOCARDIOGRAM COMPLETE  Result Date: 06/23/2022    ECHOCARDIOGRAM REPORT   Patient Name:   Jeffrey Payne Date of Exam: 06/23/2022 Medical Rec #:  [id number]      Height:       67.0 in Accession #:    [id number]     Weight:       159.0 lb Date of Birth:  [birth year]      BSA:          1.834 m Patient Age:    40 years        BP:           102/42 mmHg Patient Gender: M              HR:  73 bpm. Exam Location:  Inpatient Procedure: 2D Echo, Cardiac Doppler and Color Doppler Indications:    Elevated Troponin  History:        Patient has prior history of Echocardiogram examinations, most                 recent 02/01/2021. CHF, Arrythmias:LBBB and Bradycardia,                 Signs/Symptoms:Hypotension and Shortness of Breath; Risk                 Factors:Hypertension and Dyslipidemia.  Sonographer:    Ronny Flurry Referring Phys: [id number] [place]  1. Left ventricular ejection fraction, by estimation, is 30 to 35%. The left ventricle has moderately decreased function. The left ventricle demonstrates global hypokinesis. There is mild concentric left ventricular hypertrophy. Left ventricular diastolic parameters are indeterminate.  2. Right ventricular systolic function is moderately reduced. The right ventricular size is normal.  3. The mitral valve is normal in structure. No evidence of mitral valve regurgitation. No evidence of mitral stenosis.  4. The aortic valve is tricuspid. There is mild calcification of the aortic valve. Aortic valve regurgitation is mild. Aortic valve sclerosis/calcification is present, without any evidence of aortic stenosis.  5. The inferior vena cava is normal in size with greater than 50% respiratory variability, suggesting right atrial pressure of 3 mmHg. FINDINGS  Left Ventricle: Left ventricular ejection fraction, by estimation, is 30 to 35%. The left ventricle has moderately decreased function. The left ventricle demonstrates global hypokinesis. The left ventricular internal cavity size was normal in size. There is mild concentric left ventricular hypertrophy. Abnormal (paradoxical) septal motion, consistent with left bundle branch block. Left ventricular diastolic parameters are indeterminate. Right Ventricle: The right ventricular size is normal. No increase in right  ventricular wall thickness. Right ventricular systolic function is moderately reduced. Left Atrium: Left atrial size was normal in size. Right Atrium: Right atrial size was normal in size. Pericardium: There is no evidence of pericardial effusion. Mitral Valve: The mitral valve is normal in structure. Mild mitral annular calcification. No evidence of mitral valve regurgitation. No evidence of mitral valve stenosis. Tricuspid Valve: The tricuspid valve is normal in structure. Tricuspid valve regurgitation is not demonstrated. No evidence of tricuspid stenosis. Aortic Valve: The aortic valve is tricuspid. There is mild calcification of the aortic valve. Aortic valve regurgitation is mild. Aortic valve sclerosis/calcification is present, without any evidence of aortic stenosis. Aortic valve mean gradient measures 3.5 mmHg. Aortic valve peak gradient measures 5.5 mmHg. Aortic valve area, by VTI measures 2.73 cm. Pulmonic Valve: The pulmonic valve was normal in structure. Pulmonic valve regurgitation is not visualized. No evidence of pulmonic stenosis. Aorta: The aortic root is normal in size and structure. Venous: The inferior vena cava is normal in size with greater than 50% respiratory variability, suggesting right atrial pressure of 3 mmHg. IAS/Shunts: No atrial level shunt detected by color flow Doppler.  LEFT VENTRICLE PLAX 2D LVIDd:         5.00 cm LVIDs:         4.10 cm LV PW:         1.10 cm LV IVS:        1.10 cm LVOT diam:     2.00 cm LV SV:         55 LV SV Index:   30 LVOT Area:     3.14 cm  RIGHT VENTRICLE  IVC TAPSE (M-mode): 1.7 cm  IVC diam: 1.50 cm LEFT ATRIUM             Index        RIGHT ATRIUM           Index LA diam:        3.70 cm 2.02 cm/m   RA Area:     13.40 cm LA Vol (A2C):   18.9 ml 10.30 ml/m  RA Volume:   30.00 ml  16.35 ml/m LA Vol (A4C):   39.2 ml 21.34 ml/m LA Biplane Vol: 26.5 ml 14.45 ml/m  AORTIC VALVE AV Area (Vmax):    2.54 cm AV Area (Vmean):   2.31 cm AV Area  (VTI):     2.73 cm AV Vmax:           117.50 cm/s AV Vmean:          81.750 cm/s AV VTI:            0.202 m AV Peak Grad:      5.5 mmHg AV Mean Grad:      3.5 mmHg LVOT Vmax:         94.90 cm/s LVOT Vmean:        60.000 cm/s LVOT VTI:          0.175 m LVOT/AV VTI ratio: 0.87  AORTA Ao Root diam: 3.50 cm Ao Asc diam:  3.60 cm  SHUNTS Systemic VTI:  0.18 m Systemic Diam: 2.00 cm Glori Bickers MD Electronically signed by Glori Bickers MD Signature Date/Time: 06/23/2022/11:28:21 AM    Final    CT Angio Chest PE W and/or Wo Contrast  Result Date: 06/21/2022 CLINICAL DATA:  Dyspnea, hypoxia EXAM: CT ANGIOGRAPHY CHEST WITH CONTRAST TECHNIQUE: Multidetector CT imaging of the chest was performed using the standard protocol during bolus administration of intravenous contrast. Multiplanar CT image reconstructions and MIPs were obtained to evaluate the vascular anatomy. RADIATION DOSE REDUCTION: This exam was performed according to the departmental dose-optimization program which includes automated exposure control, adjustment of the mA and/or kV according to patient size and/or use of iterative reconstruction technique. CONTRAST:  61m OMNIPAQUE IOHEXOL 350 MG/ML SOLN COMPARISON:  02/02/2022 FINDINGS: Cardiovascular: There is adequate opacification of the pulmonary arterial tree. No intraluminal filling defect identified to suggest acute pulmonary embolism. The central pulmonary arteries are enlarged in keeping with changes of pulmonary arterial hypertension. Global cardiac size is mildly enlarged. Small pericardial effusion. Mild coronary artery calcification. Extensive atherosclerotic calcification within the thoracic aorta. No aortic aneurysm. Mediastinum/Nodes: No enlarged mediastinal, hilar, or axillary lymph nodes. Thyroid gland, trachea, and esophagus demonstrate no significant findings. Lungs/Pleura: Mild right basilar dependent atelectasis. Lungs are otherwise clear save for a benign calcified granuloma  within the left upper lobe. No pneumothorax or pleural effusion. Central airways are widely patent. Upper Abdomen: No acute abnormality. Musculoskeletal: No acute bone abnormality. No lytic or blastic bone lesion. Review of the MIP images confirms the above findings. IMPRESSION: 1. No pulmonary embolism. No acute intrathoracic pathology identified. 2. Mild cardiomegaly. Mild coronary artery calcification. 3. Morphologic changes in keeping with pulmonary arterial hypertension. Aortic Atherosclerosis (ICD10-I70.0). Electronically Signed   By: AFidela SalisburyM.D.   On: 06/21/2022 18:59   CT Head Wo Contrast  Result Date: 06/21/2022 CLINICAL DATA:  Altered level of consciousness, short of breath EXAM: CT HEAD WITHOUT CONTRAST TECHNIQUE: Contiguous axial images were obtained from the base of the skull through the vertex without intravenous contrast. RADIATION DOSE REDUCTION: This  exam was performed according to the departmental dose-optimization program which includes automated exposure control, adjustment of the mA and/or kV according to patient size and/or use of iterative reconstruction technique. COMPARISON:  01/29/2021 FINDINGS: Brain: No evidence of acute infarct or hemorrhage. The lateral ventricles and midline structures are stable. Stable coarse calcifications within the central pons. Diffuse cerebral atrophy, with continued prominence of the extra-axial CSF spaces. No acute extra-axial fluid collection. No mass effect. Vascular: Stable atherosclerosis.  No hyperdense vessel. Skull: Normal. Negative for fracture or focal lesion. Sinuses/Orbits: Small bilateral mastoid effusions are unchanged. Remaining paranasal sinuses are clear. Other: None. IMPRESSION: 1. Stable head CT, no acute intracranial process. Electronically Signed   By: Randa Ngo M.D.   On: 06/21/2022 18:41   DG Chest Port 1 View  Result Date: 06/21/2022 CLINICAL DATA:  Sepsis. EXAM: PORTABLE CHEST 1 VIEW COMPARISON:  June 14, 2022.  FINDINGS: Stable cardiomediastinal silhouette. Both lungs are clear. The visualized skeletal structures are unremarkable. IMPRESSION: No active disease. Aortic Atherosclerosis (ICD10-I70.0). Electronically Signed   By: Marijo Conception M.D.   On: 06/21/2022 12:39   DG Chest Port 1 View  Result Date: 06/14/2022 CLINICAL DATA:  Cough, congestion EXAM: PORTABLE CHEST 1 VIEW COMPARISON:  02/01/2022 FINDINGS: Stable cardiomegaly. Aortic atherosclerosis. Increased perihilar and bibasilar interstitial markings. No appreciable pleural fluid collection. No pneumothorax. Stable left upper lobe granuloma. IMPRESSION: Increased perihilar and bibasilar interstitial markings, which may reflect pulmonary edema versus atypical/viral infection. Electronically Signed   By: Davina Poke D.O.   On: 06/14/2022 12:51     TODAY-DAY OF DISCHARGE:  Subjective:   Jeffrey Payne today has no headache,no chest abdominal pain,no new weakness tingling or numbness, feels much better wants to go home today.  Objective:   Blood pressure (!) 131/53, pulse 70, temperature 97.6 F (36.4 C), temperature source Oral, resp. rate 20, height '5\' 7"'$  (1.702 m), weight 49.7 kg, SpO2 97 %.  Intake/Output Summary (Last 24 hours) at 06/29/2022 0846 Last data filed at 06/28/2022 2000 Gross per 24 hour  Intake 120 ml  Output 400 ml  Net -280 ml   Filed Weights   06/27/22 0500 06/28/22 0500 06/29/22 0500  Weight: 50 kg 49.7 kg 49.7 kg    Exam: Awake Alert, Oriented *3, No new F.N deficits, Normal affect [place].AT,PERRAL Supple Neck,No JVD, No cervical lymphadenopathy appriciated.  Symmetrical Chest wall movement, Good air movement bilaterally, CTAB RRR,No Gallops,Rubs or new Murmurs, No Parasternal Heave +ve B.Sounds, Abd Soft, Non tender, No organomegaly appriciated, No rebound -guarding or rigidity. No Cyanosis, Clubbing or edema, No new Rash or bruise   PERTINENT RADIOLOGIC STUDIES: No results found.   PERTINENT LAB  RESULTS: CBC: No results for input(s): "WBC", "HGB", "HCT", "PLT" in the last 72 hours. CMET CMP     Component Value Date/Time   NA 137 06/26/2022 0832   NA 139 07/28/2016 1038   K 3.9 06/26/2022 0832   K 4.4 07/28/2016 1038   CL 98 06/26/2022 0832   CL 106 07/25/2012 1256   CO2 26 06/26/2022 0832   CO2 24 07/28/2016 1038   GLUCOSE 87 06/26/2022 0832   GLUCOSE 123 07/28/2016 1038   GLUCOSE 153 (H) 07/25/2012 1256   BUN 56 (H) 06/26/2022 0832   BUN 38.3 (H) 07/28/2016 1038   CREATININE 5.78 (H) 06/26/2022 0832   CREATININE 1.9 (H) 07/28/2016 1038   CALCIUM 9.3 06/26/2022 0832   CALCIUM 9.9 07/28/2016 1038   PROT 6.2 (L) 06/22/2022 0340   PROT  6.3 07/28/2016 1038   PROT 6.7 07/28/2016 1038   ALBUMIN 2.3 (L) 06/26/2022 0832   ALBUMIN 3.4 (L) 07/28/2016 1038   AST 15 06/22/2022 0340   AST 15 07/28/2016 1038   ALT 10 06/22/2022 0340   ALT 11 07/28/2016 1038   ALKPHOS 102 06/22/2022 0340   ALKPHOS 124 07/28/2016 1038   BILITOT 1.8 (H) 06/22/2022 0340   BILITOT 0.47 07/28/2016 1038   GFRNONAA 9 (L) 06/26/2022 0832   GFRAA 29 (L) 10/09/2019 0430    GFR Estimated Creatinine Clearance: 6.3 mL/min (A) (by C-G formula based on SCr of 5.78 mg/dL (H)). No results for input(s): "LIPASE", "AMYLASE" in the last 72 hours. No results for input(s): "CKTOTAL", "CKMB", "CKMBINDEX", "TROPONINI" in the last 72 hours. Invalid input(s): "POCBNP" No results for input(s): "DDIMER" in the last 72 hours. No results for input(s): "HGBA1C" in the last 72 hours. No results for input(s): "CHOL", "HDL", "LDLCALC", "TRIG", "CHOLHDL", "LDLDIRECT" in the last 72 hours. No results for input(s): "TSH", "T4TOTAL", "T3FREE", "THYROIDAB" in the last 72 hours.  Invalid input(s): "FREET3" No results for input(s): "VITAMINB12", "FOLATE", "FERRITIN", "TIBC", "IRON", "RETICCTPCT" in the last 72 hours. Coags: No results for input(s): "INR" in the last 72 hours.  Invalid input(s): "PT" Microbiology: Recent  Results (from the past 240 hour(s))  Blood Culture (routine x 2)     Status: Abnormal   Collection Time: 06/21/22 12:15 PM   Specimen: BLOOD  Result Value Ref Range Status   Specimen Description BLOOD LEFT ANTECUBITAL  Final   Special Requests   Final    BOTTLES DRAWN AEROBIC AND ANAEROBIC Blood Culture adequate volume   Culture  Setup Time   Final    GRAM POSITIVE COCCI IN BOTH AEROBIC AND ANAEROBIC BOTTLES Organism ID to follow CRITICAL RESULT CALLED TO, READ BACK BY AND VERIFIED WITH: EVernona Rieger PHARMD, AT [id number] 06/22/22 D. VANHOOK    Culture (A)  Final    AEROCOCCUS VIRIDANS Standardized susceptibility testing for this organism is not available. Performed at [facility], [place] [address], [place], [place] [address]    Report Status 06/24/2022 FINAL  Final  Resp panel by RT-PCR (RSV, Flu A&B, Covid) Anterior Nasal Swab     Status: None   Collection Time: 06/21/22 12:15 PM   Specimen: Anterior Nasal Swab  Result Value Ref Range Status   SARS Coronavirus 2 by RT PCR NEGATIVE NEGATIVE Final   Influenza A by PCR NEGATIVE NEGATIVE Final   Influenza B by PCR NEGATIVE NEGATIVE Final    Comment: (NOTE) The Xpert Xpress SARS-CoV-2/FLU/RSV plus assay is intended as an aid in the diagnosis of influenza from Nasopharyngeal swab specimens and should not be used as a sole basis for treatment. Nasal washings and aspirates are unacceptable for Xpert Xpress SARS-CoV-2/FLU/RSV testing.  Fact Sheet for Patients: EntrepreneurPulse.com.au  Fact Sheet for Healthcare Providers: IncredibleEmployment.be  This test is not yet approved or cleared by the [place] FDA and has been authorized for detection and/or diagnosis of SARS-CoV-2 by FDA under an Emergency Use Authorization (EUA). This EUA will remain in effect (meaning this test can be used) for the duration of the COVID-19 declaration under Section 564(b)(1) of the Act, 21 U.S.C. section  360bbb-3(b)(1), unless the authorization is terminated or revoked.     Resp Syncytial Virus by PCR NEGATIVE NEGATIVE Final    Comment: (NOTE) Fact Sheet for Patients: EntrepreneurPulse.com.au  Fact Sheet for Healthcare Providers: IncredibleEmployment.be  This test is not yet approved or cleared  by the [place] and has been authorized for detection and/or diagnosis of SARS-CoV-2 by FDA under an Emergency Use Authorization (EUA). This EUA will remain in effect (meaning this test can be used) for the duration of the COVID-19 declaration under Section 564(b)(1) of the Act, 21 U.S.C. section 360bbb-3(b)(1), unless the authorization is terminated or revoked.  Performed at [facility], [place] [address], [place], [place] [address]   Blood Culture ID Panel (Reflexed)     Status: None   Collection Time: 06/21/22 12:15 PM  Result Value Ref Range Status   Enterococcus faecalis NOT DETECTED NOT DETECTED Final   Enterococcus Faecium NOT DETECTED NOT DETECTED Final   Listeria monocytogenes NOT DETECTED NOT DETECTED Final   Staphylococcus species NOT DETECTED NOT DETECTED Final   Staphylococcus aureus (BCID) NOT DETECTED NOT DETECTED Final   Staphylococcus epidermidis NOT DETECTED NOT DETECTED Final   Staphylococcus lugdunensis NOT DETECTED NOT DETECTED Final   Streptococcus species NOT DETECTED NOT DETECTED Final   Streptococcus agalactiae NOT DETECTED NOT DETECTED Final   Streptococcus pneumoniae NOT DETECTED NOT DETECTED Final   Streptococcus pyogenes NOT DETECTED NOT DETECTED Final   A.calcoaceticus-baumannii NOT DETECTED NOT DETECTED Final   Bacteroides fragilis NOT DETECTED NOT DETECTED Final   Enterobacterales NOT DETECTED NOT DETECTED Final   Enterobacter cloacae complex NOT DETECTED NOT DETECTED Final   Escherichia coli NOT DETECTED NOT DETECTED Final   Klebsiella aerogenes NOT DETECTED NOT DETECTED Final   Klebsiella oxytoca NOT  DETECTED NOT DETECTED Final   Klebsiella pneumoniae NOT DETECTED NOT DETECTED Final   Proteus species NOT DETECTED NOT DETECTED Final   Salmonella species NOT DETECTED NOT DETECTED Final   Serratia marcescens NOT DETECTED NOT DETECTED Final   Haemophilus influenzae NOT DETECTED NOT DETECTED Final   Neisseria meningitidis NOT DETECTED NOT DETECTED Final   Pseudomonas aeruginosa NOT DETECTED NOT DETECTED Final   Stenotrophomonas maltophilia NOT DETECTED NOT DETECTED Final   Candida albicans NOT DETECTED NOT DETECTED Final   Candida auris NOT DETECTED NOT DETECTED Final   Candida glabrata NOT DETECTED NOT DETECTED Final   Candida krusei NOT DETECTED NOT DETECTED Final   Candida parapsilosis NOT DETECTED NOT DETECTED Final   Candida tropicalis NOT DETECTED NOT DETECTED Final   Cryptococcus neoformans/gattii NOT DETECTED NOT DETECTED Final    Comment: Performed at [facility], [address], [place], [place] [address]  MRSA Next Gen by PCR, Nasal     Status: None   Collection Time: 06/21/22  4:53 PM   Specimen: Nasal Mucosa; Nasal Swab  Result Value Ref Range Status   MRSA by PCR Next Gen NOT DETECTED NOT DETECTED Final    Comment: (NOTE) The GeneXpert MRSA Assay (FDA approved for NASAL specimens only), is one component of a comprehensive MRSA colonization surveillance program. It is not intended to diagnose MRSA infection nor to guide or monitor treatment for MRSA infections. Test performance is not FDA approved in patients less than 74 years old. Performed at [facility], [place] [address], [place], [place] [address]   Culture, blood (Routine X 2) w Reflex to ID Panel     Status: None   Collection Time: 06/24/22 12:21 PM   Specimen: BLOOD LEFT HAND  Result Value Ref Range Status   Specimen Description BLOOD LEFT HAND  Final   Special Requests   Final    BOTTLES DRAWN AEROBIC ONLY Blood Culture adequate volume   Culture   Final  NO GROWTH 5 DAYS Performed at [facility], [place] [address] [place], [place] [address]    Report Status 06/29/2022 FINAL  Final  Culture, blood (Routine X 2) w Reflex to ID Panel     Status: None   Collection Time: 06/24/22 12:30 PM   Specimen: BLOOD LEFT HAND  Result Value Ref Range Status   Specimen Description BLOOD LEFT HAND  Final   Special Requests   Final    BOTTLES DRAWN AEROBIC AND ANAEROBIC Blood Culture adequate volume   Culture   Final    NO GROWTH 5 DAYS Performed at [facility], [place] [address] [place], [place] [address]    Report Status 06/29/2022 FINAL  Final    FURTHER DISCHARGE INSTRUCTIONS:  Get Medicines reviewed and adjusted: Please take all your medications with you for your next visit with your Primary MD  Laboratory/radiological data: Please request your Primary MD to go over all hospital tests and procedure/radiological results at the follow up, please ask your Primary MD to get all Hospital records sent to his/her office.  In some cases, they will be blood work, cultures and biopsy results pending at the time of your discharge. Please request that your primary care M.D. goes through all the records of your hospital data and follows up on these results.  Also Note the following: If you experience worsening of your admission symptoms, develop shortness of breath, life threatening emergency, suicidal or homicidal thoughts you must seek medical attention immediately by calling 911 or calling your MD immediately  if symptoms less severe.  You must read complete instructions/literature along with all the possible adverse reactions/side effects for all the Medicines you take and that have been prescribed to you. Take any new Medicines after you have completely understood and accpet all the possible adverse reactions/side effects.   Do not drive when taking Pain medications or sleeping medications (Benzodaizepines)  Do not take more than prescribed Pain, Sleep and Anxiety Medications. It  is not advisable to combine anxiety,sleep and pain medications without talking with your primary care practitioner  Special Instructions: If you have smoked or chewed Tobacco  in the last 2 yrs please stop smoking, stop any regular Alcohol  and or any Recreational drug use.  Wear Seat belts while driving.  Please note: You were cared for by a hospitalist during your hospital stay. Once you are discharged, your primary care physician will handle any further medical issues. Please note that NO REFILLS for any discharge medications will be authorized once you are discharged, as it is imperative that you return to your primary care physician (or establish a relationship with a primary care physician if you do not have one) for your post hospital discharge needs so that they can reassess your need for medications and monitor your lab values.  Total Time spent coordinating discharge including counseling, education and face to face time equals greater than 30 minutes.  SignedOren Binet 06/29/2022 8:46 AM

## 2022-06-30 DIAGNOSIS — M6281 Muscle weakness (generalized): Secondary | ICD-10-CM | POA: Diagnosis not present

## 2022-06-30 DIAGNOSIS — J9601 Acute respiratory failure with hypoxia: Secondary | ICD-10-CM | POA: Diagnosis not present

## 2022-06-30 DIAGNOSIS — R2681 Unsteadiness on feet: Secondary | ICD-10-CM | POA: Diagnosis not present

## 2022-07-01 DIAGNOSIS — N2581 Secondary hyperparathyroidism of renal origin: Secondary | ICD-10-CM | POA: Diagnosis not present

## 2022-07-01 DIAGNOSIS — D689 Coagulation defect, unspecified: Secondary | ICD-10-CM | POA: Diagnosis not present

## 2022-07-01 DIAGNOSIS — I10 Essential (primary) hypertension: Secondary | ICD-10-CM | POA: Diagnosis not present

## 2022-07-01 DIAGNOSIS — E43 Unspecified severe protein-calorie malnutrition: Secondary | ICD-10-CM | POA: Diagnosis not present

## 2022-07-01 DIAGNOSIS — Z992 Dependence on renal dialysis: Secondary | ICD-10-CM | POA: Diagnosis not present

## 2022-07-01 DIAGNOSIS — N186 End stage renal disease: Secondary | ICD-10-CM | POA: Diagnosis not present

## 2022-07-01 DIAGNOSIS — R531 Weakness: Secondary | ICD-10-CM | POA: Diagnosis not present

## 2022-07-03 DIAGNOSIS — Z992 Dependence on renal dialysis: Secondary | ICD-10-CM | POA: Diagnosis not present

## 2022-07-03 DIAGNOSIS — R54 Age-related physical debility: Secondary | ICD-10-CM | POA: Diagnosis not present

## 2022-07-03 DIAGNOSIS — K219 Gastro-esophageal reflux disease without esophagitis: Secondary | ICD-10-CM | POA: Diagnosis not present

## 2022-07-03 DIAGNOSIS — D689 Coagulation defect, unspecified: Secondary | ICD-10-CM | POA: Diagnosis not present

## 2022-07-03 DIAGNOSIS — N2581 Secondary hyperparathyroidism of renal origin: Secondary | ICD-10-CM | POA: Diagnosis not present

## 2022-07-03 DIAGNOSIS — N186 End stage renal disease: Secondary | ICD-10-CM | POA: Diagnosis not present

## 2022-07-03 DIAGNOSIS — Z719 Counseling, unspecified: Secondary | ICD-10-CM | POA: Diagnosis not present

## 2022-07-05 ENCOUNTER — Other Ambulatory Visit: Payer: Self-pay | Admitting: *Deleted

## 2022-07-05 NOTE — Patient Outreach (Signed)
Mr. Cotrone resides in [facility]. Screening for potential [facility] care coordination services as benefit of health plan and Primary Care Provider.   Update received from [place], [place] Education officer, museum. Transition plan is to return to [place] with wife. Has supportive daughter who lives close by.   Will continue to follow.    Marthenia Rolling, MSN, RN,BSN [place] Acute Care Coordinator [phone] (Direct dial)

## 2022-07-06 DIAGNOSIS — N2581 Secondary hyperparathyroidism of renal origin: Secondary | ICD-10-CM | POA: Diagnosis not present

## 2022-07-06 DIAGNOSIS — D689 Coagulation defect, unspecified: Secondary | ICD-10-CM | POA: Diagnosis not present

## 2022-07-06 DIAGNOSIS — R54 Age-related physical debility: Secondary | ICD-10-CM | POA: Diagnosis not present

## 2022-07-06 DIAGNOSIS — K219 Gastro-esophageal reflux disease without esophagitis: Secondary | ICD-10-CM | POA: Diagnosis not present

## 2022-07-06 DIAGNOSIS — Z992 Dependence on renal dialysis: Secondary | ICD-10-CM | POA: Diagnosis not present

## 2022-07-06 DIAGNOSIS — N186 End stage renal disease: Secondary | ICD-10-CM | POA: Diagnosis not present

## 2022-07-07 DIAGNOSIS — K219 Gastro-esophageal reflux disease without esophagitis: Secondary | ICD-10-CM | POA: Diagnosis not present

## 2022-07-07 DIAGNOSIS — M6281 Muscle weakness (generalized): Secondary | ICD-10-CM | POA: Diagnosis not present

## 2022-07-07 DIAGNOSIS — R2681 Unsteadiness on feet: Secondary | ICD-10-CM | POA: Diagnosis not present

## 2022-07-07 DIAGNOSIS — N186 End stage renal disease: Secondary | ICD-10-CM | POA: Diagnosis not present

## 2022-07-07 DIAGNOSIS — R54 Age-related physical debility: Secondary | ICD-10-CM | POA: Diagnosis not present

## 2022-07-07 DIAGNOSIS — J9601 Acute respiratory failure with hypoxia: Secondary | ICD-10-CM | POA: Diagnosis not present

## 2022-07-08 DIAGNOSIS — K219 Gastro-esophageal reflux disease without esophagitis: Secondary | ICD-10-CM | POA: Diagnosis not present

## 2022-07-08 DIAGNOSIS — N186 End stage renal disease: Secondary | ICD-10-CM | POA: Diagnosis not present

## 2022-07-08 DIAGNOSIS — D689 Coagulation defect, unspecified: Secondary | ICD-10-CM | POA: Diagnosis not present

## 2022-07-08 DIAGNOSIS — N2581 Secondary hyperparathyroidism of renal origin: Secondary | ICD-10-CM | POA: Diagnosis not present

## 2022-07-08 DIAGNOSIS — R54 Age-related physical debility: Secondary | ICD-10-CM | POA: Diagnosis not present

## 2022-07-08 DIAGNOSIS — Z992 Dependence on renal dialysis: Secondary | ICD-10-CM | POA: Diagnosis not present

## 2022-07-11 DIAGNOSIS — I120 Hypertensive chronic kidney disease with stage 5 chronic kidney disease or end stage renal disease: Secondary | ICD-10-CM | POA: Diagnosis not present

## 2022-07-11 DIAGNOSIS — A419 Sepsis, unspecified organism: Secondary | ICD-10-CM | POA: Diagnosis not present

## 2022-07-11 DIAGNOSIS — N138 Other obstructive and reflux uropathy: Secondary | ICD-10-CM | POA: Diagnosis not present

## 2022-07-11 DIAGNOSIS — K219 Gastro-esophageal reflux disease without esophagitis: Secondary | ICD-10-CM | POA: Diagnosis not present

## 2022-07-11 DIAGNOSIS — I5042 Chronic combined systolic (congestive) and diastolic (congestive) heart failure: Secondary | ICD-10-CM | POA: Diagnosis not present

## 2022-07-11 DIAGNOSIS — J9601 Acute respiratory failure with hypoxia: Secondary | ICD-10-CM | POA: Diagnosis not present

## 2022-07-11 DIAGNOSIS — I447 Left bundle-branch block, unspecified: Secondary | ICD-10-CM | POA: Diagnosis not present

## 2022-07-11 DIAGNOSIS — E43 Unspecified severe protein-calorie malnutrition: Secondary | ICD-10-CM | POA: Diagnosis not present

## 2022-07-11 DIAGNOSIS — G9341 Metabolic encephalopathy: Secondary | ICD-10-CM | POA: Diagnosis not present

## 2022-07-11 DIAGNOSIS — E785 Hyperlipidemia, unspecified: Secondary | ICD-10-CM | POA: Diagnosis not present

## 2022-07-11 DIAGNOSIS — J8410 Pulmonary fibrosis, unspecified: Secondary | ICD-10-CM | POA: Diagnosis not present

## 2022-07-11 DIAGNOSIS — R6521 Severe sepsis with septic shock: Secondary | ICD-10-CM | POA: Diagnosis not present

## 2022-07-11 DIAGNOSIS — N2581 Secondary hyperparathyroidism of renal origin: Secondary | ICD-10-CM | POA: Diagnosis not present

## 2022-07-11 DIAGNOSIS — L719 Rosacea, unspecified: Secondary | ICD-10-CM | POA: Diagnosis not present

## 2022-07-11 DIAGNOSIS — N186 End stage renal disease: Secondary | ICD-10-CM | POA: Diagnosis not present

## 2022-07-11 DIAGNOSIS — H9190 Unspecified hearing loss, unspecified ear: Secondary | ICD-10-CM | POA: Diagnosis not present

## 2022-07-11 DIAGNOSIS — M179 Osteoarthritis of knee, unspecified: Secondary | ICD-10-CM | POA: Diagnosis not present

## 2022-07-11 DIAGNOSIS — J302 Other seasonal allergic rhinitis: Secondary | ICD-10-CM | POA: Diagnosis not present

## 2022-07-11 DIAGNOSIS — D472 Monoclonal gammopathy: Secondary | ICD-10-CM | POA: Diagnosis not present

## 2022-07-11 DIAGNOSIS — D631 Anemia in chronic kidney disease: Secondary | ICD-10-CM | POA: Diagnosis not present

## 2022-07-11 DIAGNOSIS — D72829 Elevated white blood cell count, unspecified: Secondary | ICD-10-CM | POA: Diagnosis not present

## 2022-07-11 DIAGNOSIS — Z992 Dependence on renal dialysis: Secondary | ICD-10-CM | POA: Diagnosis not present

## 2022-07-12 DIAGNOSIS — D485 Neoplasm of uncertain behavior of skin: Secondary | ICD-10-CM | POA: Diagnosis not present

## 2022-07-12 DIAGNOSIS — L578 Other skin changes due to chronic exposure to nonionizing radiation: Secondary | ICD-10-CM | POA: Diagnosis not present

## 2022-07-12 DIAGNOSIS — D0439 Carcinoma in situ of skin of other parts of face: Secondary | ICD-10-CM | POA: Diagnosis not present

## 2022-07-12 DIAGNOSIS — L570 Actinic keratosis: Secondary | ICD-10-CM | POA: Diagnosis not present

## 2022-07-13 DIAGNOSIS — N2581 Secondary hyperparathyroidism of renal origin: Secondary | ICD-10-CM | POA: Diagnosis not present

## 2022-07-13 DIAGNOSIS — N186 End stage renal disease: Secondary | ICD-10-CM | POA: Diagnosis not present

## 2022-07-13 DIAGNOSIS — D689 Coagulation defect, unspecified: Secondary | ICD-10-CM | POA: Diagnosis not present

## 2022-07-13 DIAGNOSIS — Z992 Dependence on renal dialysis: Secondary | ICD-10-CM | POA: Diagnosis not present

## 2022-07-14 DIAGNOSIS — K219 Gastro-esophageal reflux disease without esophagitis: Secondary | ICD-10-CM | POA: Diagnosis not present

## 2022-07-14 DIAGNOSIS — Z992 Dependence on renal dialysis: Secondary | ICD-10-CM | POA: Diagnosis not present

## 2022-07-14 DIAGNOSIS — J9601 Acute respiratory failure with hypoxia: Secondary | ICD-10-CM | POA: Diagnosis not present

## 2022-07-14 DIAGNOSIS — H9190 Unspecified hearing loss, unspecified ear: Secondary | ICD-10-CM | POA: Diagnosis not present

## 2022-07-14 DIAGNOSIS — N138 Other obstructive and reflux uropathy: Secondary | ICD-10-CM | POA: Diagnosis not present

## 2022-07-14 DIAGNOSIS — L719 Rosacea, unspecified: Secondary | ICD-10-CM | POA: Diagnosis not present

## 2022-07-14 DIAGNOSIS — G9341 Metabolic encephalopathy: Secondary | ICD-10-CM | POA: Diagnosis not present

## 2022-07-14 DIAGNOSIS — D472 Monoclonal gammopathy: Secondary | ICD-10-CM | POA: Diagnosis not present

## 2022-07-14 DIAGNOSIS — J8410 Pulmonary fibrosis, unspecified: Secondary | ICD-10-CM | POA: Diagnosis not present

## 2022-07-14 DIAGNOSIS — J302 Other seasonal allergic rhinitis: Secondary | ICD-10-CM | POA: Diagnosis not present

## 2022-07-14 DIAGNOSIS — N2581 Secondary hyperparathyroidism of renal origin: Secondary | ICD-10-CM | POA: Diagnosis not present

## 2022-07-14 DIAGNOSIS — R6521 Severe sepsis with septic shock: Secondary | ICD-10-CM | POA: Diagnosis not present

## 2022-07-14 DIAGNOSIS — D631 Anemia in chronic kidney disease: Secondary | ICD-10-CM | POA: Diagnosis not present

## 2022-07-14 DIAGNOSIS — D72829 Elevated white blood cell count, unspecified: Secondary | ICD-10-CM | POA: Diagnosis not present

## 2022-07-14 DIAGNOSIS — N186 End stage renal disease: Secondary | ICD-10-CM | POA: Diagnosis not present

## 2022-07-14 DIAGNOSIS — I120 Hypertensive chronic kidney disease with stage 5 chronic kidney disease or end stage renal disease: Secondary | ICD-10-CM | POA: Diagnosis not present

## 2022-07-14 DIAGNOSIS — I447 Left bundle-branch block, unspecified: Secondary | ICD-10-CM | POA: Diagnosis not present

## 2022-07-14 DIAGNOSIS — I5042 Chronic combined systolic (congestive) and diastolic (congestive) heart failure: Secondary | ICD-10-CM | POA: Diagnosis not present

## 2022-07-14 DIAGNOSIS — E785 Hyperlipidemia, unspecified: Secondary | ICD-10-CM | POA: Diagnosis not present

## 2022-07-14 DIAGNOSIS — E43 Unspecified severe protein-calorie malnutrition: Secondary | ICD-10-CM | POA: Diagnosis not present

## 2022-07-14 DIAGNOSIS — M179 Osteoarthritis of knee, unspecified: Secondary | ICD-10-CM | POA: Diagnosis not present

## 2022-07-14 DIAGNOSIS — A419 Sepsis, unspecified organism: Secondary | ICD-10-CM | POA: Diagnosis not present

## 2022-07-15 DIAGNOSIS — Z992 Dependence on renal dialysis: Secondary | ICD-10-CM | POA: Diagnosis not present

## 2022-07-15 DIAGNOSIS — D689 Coagulation defect, unspecified: Secondary | ICD-10-CM | POA: Diagnosis not present

## 2022-07-15 DIAGNOSIS — N186 End stage renal disease: Secondary | ICD-10-CM | POA: Diagnosis not present

## 2022-07-15 DIAGNOSIS — N2581 Secondary hyperparathyroidism of renal origin: Secondary | ICD-10-CM | POA: Diagnosis not present

## 2022-07-16 DIAGNOSIS — R338 Other retention of urine: Secondary | ICD-10-CM | POA: Diagnosis not present

## 2022-07-17 DIAGNOSIS — N186 End stage renal disease: Secondary | ICD-10-CM | POA: Diagnosis not present

## 2022-07-17 DIAGNOSIS — N2581 Secondary hyperparathyroidism of renal origin: Secondary | ICD-10-CM | POA: Diagnosis not present

## 2022-07-17 DIAGNOSIS — D689 Coagulation defect, unspecified: Secondary | ICD-10-CM | POA: Diagnosis not present

## 2022-07-17 DIAGNOSIS — Z992 Dependence on renal dialysis: Secondary | ICD-10-CM | POA: Diagnosis not present

## 2022-07-18 DIAGNOSIS — D631 Anemia in chronic kidney disease: Secondary | ICD-10-CM | POA: Diagnosis not present

## 2022-07-18 DIAGNOSIS — J302 Other seasonal allergic rhinitis: Secondary | ICD-10-CM | POA: Diagnosis not present

## 2022-07-18 DIAGNOSIS — H9190 Unspecified hearing loss, unspecified ear: Secondary | ICD-10-CM | POA: Diagnosis not present

## 2022-07-18 DIAGNOSIS — K219 Gastro-esophageal reflux disease without esophagitis: Secondary | ICD-10-CM | POA: Diagnosis not present

## 2022-07-18 DIAGNOSIS — R6521 Severe sepsis with septic shock: Secondary | ICD-10-CM | POA: Diagnosis not present

## 2022-07-18 DIAGNOSIS — N2581 Secondary hyperparathyroidism of renal origin: Secondary | ICD-10-CM | POA: Diagnosis not present

## 2022-07-18 DIAGNOSIS — I5042 Chronic combined systolic (congestive) and diastolic (congestive) heart failure: Secondary | ICD-10-CM | POA: Diagnosis not present

## 2022-07-18 DIAGNOSIS — Z992 Dependence on renal dialysis: Secondary | ICD-10-CM | POA: Diagnosis not present

## 2022-07-18 DIAGNOSIS — I447 Left bundle-branch block, unspecified: Secondary | ICD-10-CM | POA: Diagnosis not present

## 2022-07-18 DIAGNOSIS — E785 Hyperlipidemia, unspecified: Secondary | ICD-10-CM | POA: Diagnosis not present

## 2022-07-18 DIAGNOSIS — N186 End stage renal disease: Secondary | ICD-10-CM | POA: Diagnosis not present

## 2022-07-18 DIAGNOSIS — I120 Hypertensive chronic kidney disease with stage 5 chronic kidney disease or end stage renal disease: Secondary | ICD-10-CM | POA: Diagnosis not present

## 2022-07-18 DIAGNOSIS — E43 Unspecified severe protein-calorie malnutrition: Secondary | ICD-10-CM | POA: Diagnosis not present

## 2022-07-18 DIAGNOSIS — D72829 Elevated white blood cell count, unspecified: Secondary | ICD-10-CM | POA: Diagnosis not present

## 2022-07-18 DIAGNOSIS — J9601 Acute respiratory failure with hypoxia: Secondary | ICD-10-CM | POA: Diagnosis not present

## 2022-07-18 DIAGNOSIS — J8410 Pulmonary fibrosis, unspecified: Secondary | ICD-10-CM | POA: Diagnosis not present

## 2022-07-18 DIAGNOSIS — D472 Monoclonal gammopathy: Secondary | ICD-10-CM | POA: Diagnosis not present

## 2022-07-18 DIAGNOSIS — L719 Rosacea, unspecified: Secondary | ICD-10-CM | POA: Diagnosis not present

## 2022-07-18 DIAGNOSIS — A419 Sepsis, unspecified organism: Secondary | ICD-10-CM | POA: Diagnosis not present

## 2022-07-18 DIAGNOSIS — M179 Osteoarthritis of knee, unspecified: Secondary | ICD-10-CM | POA: Diagnosis not present

## 2022-07-18 DIAGNOSIS — N138 Other obstructive and reflux uropathy: Secondary | ICD-10-CM | POA: Diagnosis not present

## 2022-07-18 DIAGNOSIS — G9341 Metabolic encephalopathy: Secondary | ICD-10-CM | POA: Diagnosis not present

## 2022-07-19 DIAGNOSIS — Z66 Do not resuscitate: Secondary | ICD-10-CM | POA: Diagnosis not present

## 2022-07-19 DIAGNOSIS — Z992 Dependence on renal dialysis: Secondary | ICD-10-CM | POA: Diagnosis not present

## 2022-07-19 DIAGNOSIS — Z7189 Other specified counseling: Secondary | ICD-10-CM | POA: Diagnosis not present

## 2022-07-19 DIAGNOSIS — N186 End stage renal disease: Secondary | ICD-10-CM | POA: Diagnosis not present

## 2022-07-19 DIAGNOSIS — I480 Paroxysmal atrial fibrillation: Secondary | ICD-10-CM | POA: Diagnosis not present

## 2022-07-20 DIAGNOSIS — N2581 Secondary hyperparathyroidism of renal origin: Secondary | ICD-10-CM | POA: Diagnosis not present

## 2022-07-20 DIAGNOSIS — Z992 Dependence on renal dialysis: Secondary | ICD-10-CM | POA: Diagnosis not present

## 2022-07-20 DIAGNOSIS — D689 Coagulation defect, unspecified: Secondary | ICD-10-CM | POA: Diagnosis not present

## 2022-07-20 DIAGNOSIS — N186 End stage renal disease: Secondary | ICD-10-CM | POA: Diagnosis not present

## 2022-07-21 DIAGNOSIS — M179 Osteoarthritis of knee, unspecified: Secondary | ICD-10-CM | POA: Diagnosis not present

## 2022-07-21 DIAGNOSIS — J302 Other seasonal allergic rhinitis: Secondary | ICD-10-CM | POA: Diagnosis not present

## 2022-07-21 DIAGNOSIS — G9341 Metabolic encephalopathy: Secondary | ICD-10-CM | POA: Diagnosis not present

## 2022-07-21 DIAGNOSIS — J9601 Acute respiratory failure with hypoxia: Secondary | ICD-10-CM | POA: Diagnosis not present

## 2022-07-21 DIAGNOSIS — I120 Hypertensive chronic kidney disease with stage 5 chronic kidney disease or end stage renal disease: Secondary | ICD-10-CM | POA: Diagnosis not present

## 2022-07-21 DIAGNOSIS — I5042 Chronic combined systolic (congestive) and diastolic (congestive) heart failure: Secondary | ICD-10-CM | POA: Diagnosis not present

## 2022-07-21 DIAGNOSIS — N138 Other obstructive and reflux uropathy: Secondary | ICD-10-CM | POA: Diagnosis not present

## 2022-07-21 DIAGNOSIS — R6521 Severe sepsis with septic shock: Secondary | ICD-10-CM | POA: Diagnosis not present

## 2022-07-21 DIAGNOSIS — E43 Unspecified severe protein-calorie malnutrition: Secondary | ICD-10-CM | POA: Diagnosis not present

## 2022-07-21 DIAGNOSIS — I447 Left bundle-branch block, unspecified: Secondary | ICD-10-CM | POA: Diagnosis not present

## 2022-07-21 DIAGNOSIS — E785 Hyperlipidemia, unspecified: Secondary | ICD-10-CM | POA: Diagnosis not present

## 2022-07-21 DIAGNOSIS — H9190 Unspecified hearing loss, unspecified ear: Secondary | ICD-10-CM | POA: Diagnosis not present

## 2022-07-21 DIAGNOSIS — Z992 Dependence on renal dialysis: Secondary | ICD-10-CM | POA: Diagnosis not present

## 2022-07-21 DIAGNOSIS — N186 End stage renal disease: Secondary | ICD-10-CM | POA: Diagnosis not present

## 2022-07-21 DIAGNOSIS — A419 Sepsis, unspecified organism: Secondary | ICD-10-CM | POA: Diagnosis not present

## 2022-07-21 DIAGNOSIS — D631 Anemia in chronic kidney disease: Secondary | ICD-10-CM | POA: Diagnosis not present

## 2022-07-21 DIAGNOSIS — K219 Gastro-esophageal reflux disease without esophagitis: Secondary | ICD-10-CM | POA: Diagnosis not present

## 2022-07-21 DIAGNOSIS — N2581 Secondary hyperparathyroidism of renal origin: Secondary | ICD-10-CM | POA: Diagnosis not present

## 2022-07-21 DIAGNOSIS — J8410 Pulmonary fibrosis, unspecified: Secondary | ICD-10-CM | POA: Diagnosis not present

## 2022-07-21 DIAGNOSIS — D72829 Elevated white blood cell count, unspecified: Secondary | ICD-10-CM | POA: Diagnosis not present

## 2022-07-21 DIAGNOSIS — D472 Monoclonal gammopathy: Secondary | ICD-10-CM | POA: Diagnosis not present

## 2022-07-21 DIAGNOSIS — L719 Rosacea, unspecified: Secondary | ICD-10-CM | POA: Diagnosis not present

## 2022-07-22 DIAGNOSIS — Z992 Dependence on renal dialysis: Secondary | ICD-10-CM | POA: Diagnosis not present

## 2022-07-22 DIAGNOSIS — D689 Coagulation defect, unspecified: Secondary | ICD-10-CM | POA: Diagnosis not present

## 2022-07-22 DIAGNOSIS — N186 End stage renal disease: Secondary | ICD-10-CM | POA: Diagnosis not present

## 2022-07-22 DIAGNOSIS — N2581 Secondary hyperparathyroidism of renal origin: Secondary | ICD-10-CM | POA: Diagnosis not present

## 2022-07-23 DIAGNOSIS — K219 Gastro-esophageal reflux disease without esophagitis: Secondary | ICD-10-CM | POA: Diagnosis not present

## 2022-07-23 DIAGNOSIS — D631 Anemia in chronic kidney disease: Secondary | ICD-10-CM | POA: Diagnosis not present

## 2022-07-23 DIAGNOSIS — N138 Other obstructive and reflux uropathy: Secondary | ICD-10-CM | POA: Diagnosis not present

## 2022-07-23 DIAGNOSIS — H9190 Unspecified hearing loss, unspecified ear: Secondary | ICD-10-CM | POA: Diagnosis not present

## 2022-07-23 DIAGNOSIS — D72829 Elevated white blood cell count, unspecified: Secondary | ICD-10-CM | POA: Diagnosis not present

## 2022-07-23 DIAGNOSIS — D472 Monoclonal gammopathy: Secondary | ICD-10-CM | POA: Diagnosis not present

## 2022-07-23 DIAGNOSIS — M179 Osteoarthritis of knee, unspecified: Secondary | ICD-10-CM | POA: Diagnosis not present

## 2022-07-23 DIAGNOSIS — N186 End stage renal disease: Secondary | ICD-10-CM | POA: Diagnosis not present

## 2022-07-23 DIAGNOSIS — I447 Left bundle-branch block, unspecified: Secondary | ICD-10-CM | POA: Diagnosis not present

## 2022-07-23 DIAGNOSIS — I120 Hypertensive chronic kidney disease with stage 5 chronic kidney disease or end stage renal disease: Secondary | ICD-10-CM | POA: Diagnosis not present

## 2022-07-23 DIAGNOSIS — J8410 Pulmonary fibrosis, unspecified: Secondary | ICD-10-CM | POA: Diagnosis not present

## 2022-07-23 DIAGNOSIS — I5042 Chronic combined systolic (congestive) and diastolic (congestive) heart failure: Secondary | ICD-10-CM | POA: Diagnosis not present

## 2022-07-23 DIAGNOSIS — N2581 Secondary hyperparathyroidism of renal origin: Secondary | ICD-10-CM | POA: Diagnosis not present

## 2022-07-23 DIAGNOSIS — Z992 Dependence on renal dialysis: Secondary | ICD-10-CM | POA: Diagnosis not present

## 2022-07-23 DIAGNOSIS — J302 Other seasonal allergic rhinitis: Secondary | ICD-10-CM | POA: Diagnosis not present

## 2022-07-23 DIAGNOSIS — R6521 Severe sepsis with septic shock: Secondary | ICD-10-CM | POA: Diagnosis not present

## 2022-07-23 DIAGNOSIS — J9601 Acute respiratory failure with hypoxia: Secondary | ICD-10-CM | POA: Diagnosis not present

## 2022-07-23 DIAGNOSIS — E43 Unspecified severe protein-calorie malnutrition: Secondary | ICD-10-CM | POA: Diagnosis not present

## 2022-07-23 DIAGNOSIS — E785 Hyperlipidemia, unspecified: Secondary | ICD-10-CM | POA: Diagnosis not present

## 2022-07-23 DIAGNOSIS — G9341 Metabolic encephalopathy: Secondary | ICD-10-CM | POA: Diagnosis not present

## 2022-07-23 DIAGNOSIS — L719 Rosacea, unspecified: Secondary | ICD-10-CM | POA: Diagnosis not present

## 2022-07-23 DIAGNOSIS — A419 Sepsis, unspecified organism: Secondary | ICD-10-CM | POA: Diagnosis not present

## 2022-07-24 DIAGNOSIS — D689 Coagulation defect, unspecified: Secondary | ICD-10-CM | POA: Diagnosis not present

## 2022-07-24 DIAGNOSIS — Z992 Dependence on renal dialysis: Secondary | ICD-10-CM | POA: Diagnosis not present

## 2022-07-24 DIAGNOSIS — N2581 Secondary hyperparathyroidism of renal origin: Secondary | ICD-10-CM | POA: Diagnosis not present

## 2022-07-24 DIAGNOSIS — N186 End stage renal disease: Secondary | ICD-10-CM | POA: Diagnosis not present

## 2022-07-25 DIAGNOSIS — Z992 Dependence on renal dialysis: Secondary | ICD-10-CM | POA: Diagnosis not present

## 2022-07-25 DIAGNOSIS — N186 End stage renal disease: Secondary | ICD-10-CM | POA: Diagnosis not present

## 2022-07-25 DIAGNOSIS — I129 Hypertensive chronic kidney disease with stage 1 through stage 4 chronic kidney disease, or unspecified chronic kidney disease: Secondary | ICD-10-CM | POA: Diagnosis not present

## 2022-07-26 DIAGNOSIS — R0602 Shortness of breath: Secondary | ICD-10-CM | POA: Diagnosis not present

## 2022-07-26 DIAGNOSIS — J9601 Acute respiratory failure with hypoxia: Secondary | ICD-10-CM | POA: Diagnosis not present

## 2022-07-26 DIAGNOSIS — J209 Acute bronchitis, unspecified: Secondary | ICD-10-CM | POA: Diagnosis not present

## 2022-07-26 IMAGING — DX DG HIP (WITH OR WITHOUT PELVIS) 2-3V*L*
3 series · 3 of 3 positions shown · non-contrast
Comparison: Left hip x-ray 02/20/2021.

CLINICAL DATA: Postoperative fracture with left hip pain.

EXAM:
DG HIP (WITH OR WITHOUT PELVIS) 2-3V LEFT

[pelvis ap (1 of 2)]
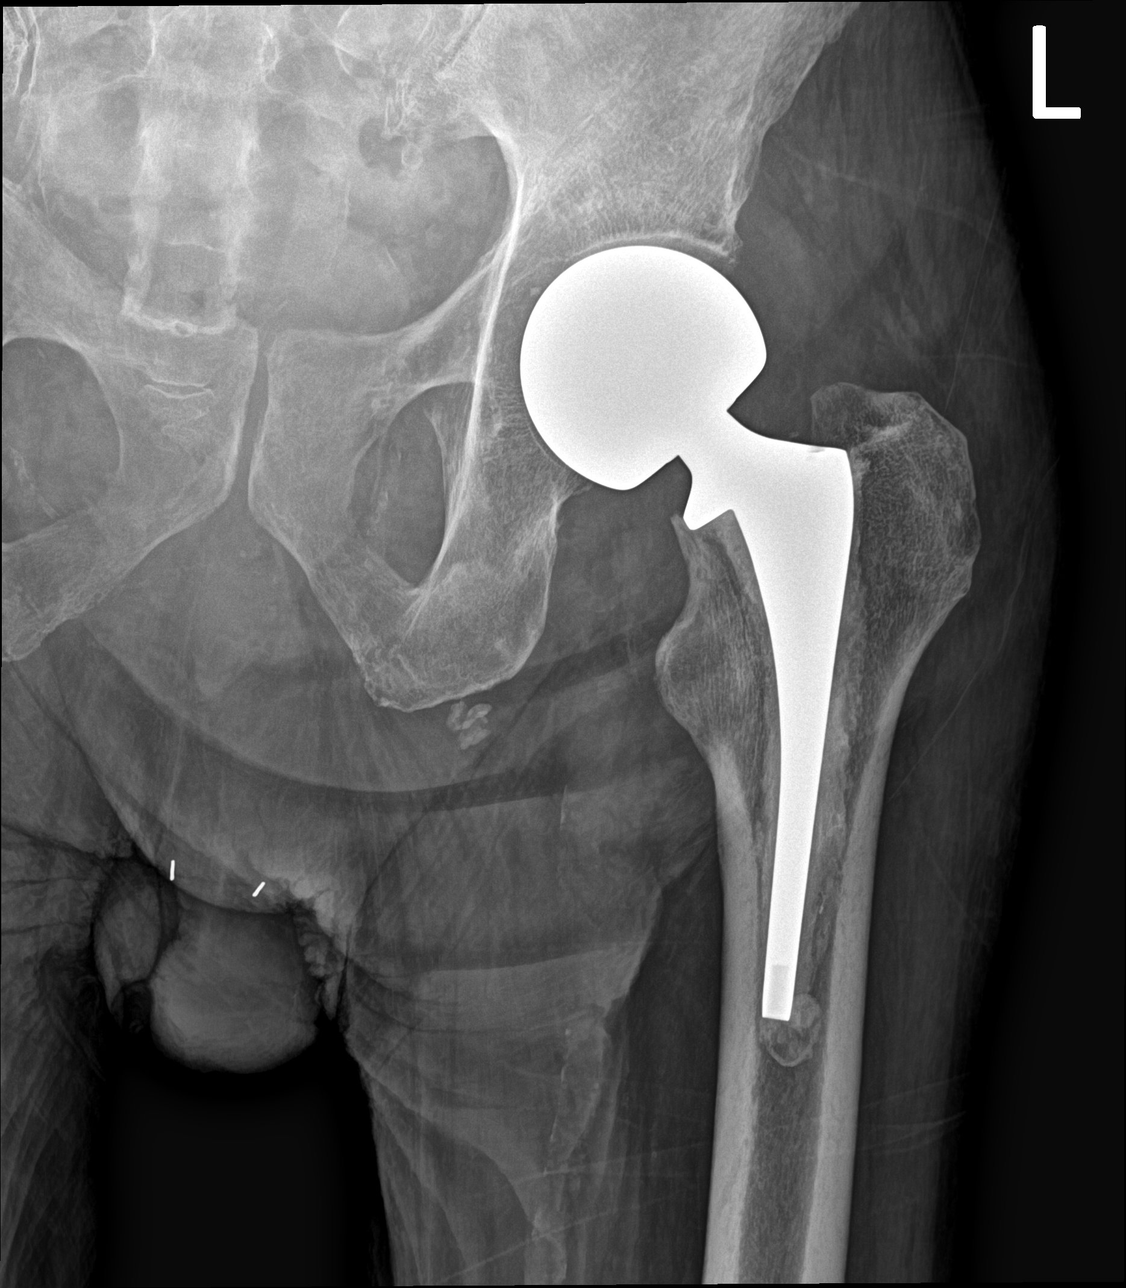

[pelvis ap (2 of 2)]
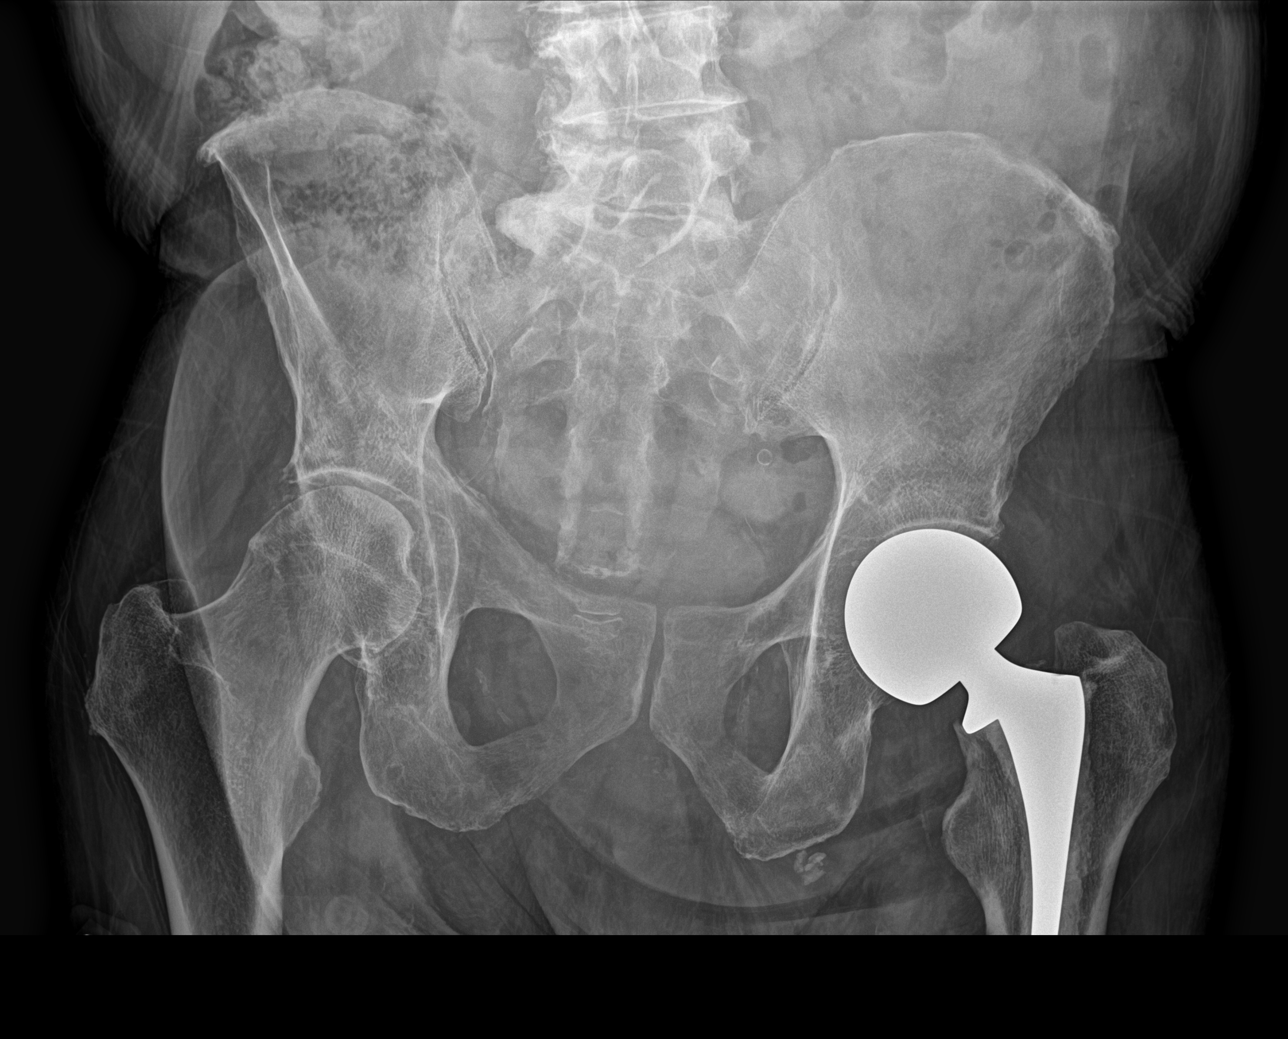

[hip lat]
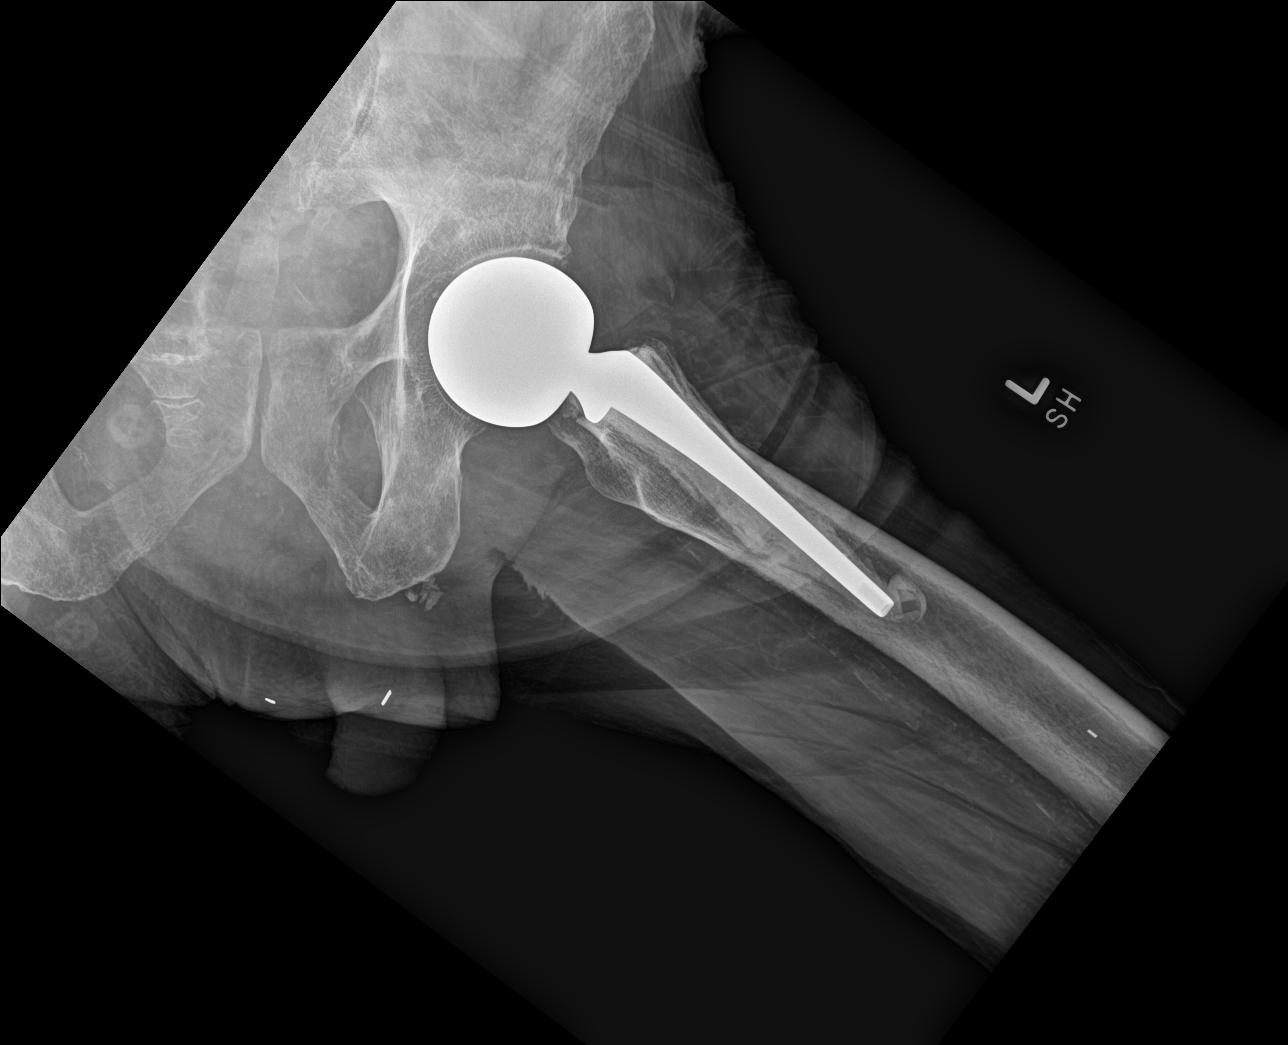

[3 of 3 positions shown; findings below may reference images not displayed]

FINDINGS: Left hip arthroplasty is present in anatomic alignment. There is no
evidence for hardware loosening. There is no acute fracture. There
are mild degenerative changes of the right hip. There are
degenerative changes of the lower lumbar spine. There are vascular
calcifications in the soft tissue similar to the prior study.
IMPRESSION: 1. No acute bony abnormality.
2. Left hip arthroplasty appears unchanged from prior exam.

## 2022-07-27 DIAGNOSIS — N186 End stage renal disease: Secondary | ICD-10-CM | POA: Diagnosis not present

## 2022-07-27 DIAGNOSIS — N2581 Secondary hyperparathyroidism of renal origin: Secondary | ICD-10-CM | POA: Diagnosis not present

## 2022-07-27 DIAGNOSIS — Z992 Dependence on renal dialysis: Secondary | ICD-10-CM | POA: Diagnosis not present

## 2022-07-27 DIAGNOSIS — D631 Anemia in chronic kidney disease: Secondary | ICD-10-CM | POA: Diagnosis not present

## 2022-07-27 DIAGNOSIS — D689 Coagulation defect, unspecified: Secondary | ICD-10-CM | POA: Diagnosis not present

## 2022-07-28 DIAGNOSIS — J9601 Acute respiratory failure with hypoxia: Secondary | ICD-10-CM | POA: Diagnosis not present

## 2022-07-28 DIAGNOSIS — I447 Left bundle-branch block, unspecified: Secondary | ICD-10-CM | POA: Diagnosis not present

## 2022-07-28 DIAGNOSIS — N186 End stage renal disease: Secondary | ICD-10-CM | POA: Diagnosis not present

## 2022-07-28 DIAGNOSIS — D72829 Elevated white blood cell count, unspecified: Secondary | ICD-10-CM | POA: Diagnosis not present

## 2022-07-28 DIAGNOSIS — H9190 Unspecified hearing loss, unspecified ear: Secondary | ICD-10-CM | POA: Diagnosis not present

## 2022-07-28 DIAGNOSIS — J8410 Pulmonary fibrosis, unspecified: Secondary | ICD-10-CM | POA: Diagnosis not present

## 2022-07-28 DIAGNOSIS — I120 Hypertensive chronic kidney disease with stage 5 chronic kidney disease or end stage renal disease: Secondary | ICD-10-CM | POA: Diagnosis not present

## 2022-07-28 DIAGNOSIS — G9341 Metabolic encephalopathy: Secondary | ICD-10-CM | POA: Diagnosis not present

## 2022-07-28 DIAGNOSIS — I5042 Chronic combined systolic (congestive) and diastolic (congestive) heart failure: Secondary | ICD-10-CM | POA: Diagnosis not present

## 2022-07-28 DIAGNOSIS — L719 Rosacea, unspecified: Secondary | ICD-10-CM | POA: Diagnosis not present

## 2022-07-28 DIAGNOSIS — N138 Other obstructive and reflux uropathy: Secondary | ICD-10-CM | POA: Diagnosis not present

## 2022-07-28 DIAGNOSIS — M179 Osteoarthritis of knee, unspecified: Secondary | ICD-10-CM | POA: Diagnosis not present

## 2022-07-28 DIAGNOSIS — N2581 Secondary hyperparathyroidism of renal origin: Secondary | ICD-10-CM | POA: Diagnosis not present

## 2022-07-28 DIAGNOSIS — D631 Anemia in chronic kidney disease: Secondary | ICD-10-CM | POA: Diagnosis not present

## 2022-07-28 DIAGNOSIS — E785 Hyperlipidemia, unspecified: Secondary | ICD-10-CM | POA: Diagnosis not present

## 2022-07-28 DIAGNOSIS — E43 Unspecified severe protein-calorie malnutrition: Secondary | ICD-10-CM | POA: Diagnosis not present

## 2022-07-28 DIAGNOSIS — J302 Other seasonal allergic rhinitis: Secondary | ICD-10-CM | POA: Diagnosis not present

## 2022-07-28 DIAGNOSIS — D472 Monoclonal gammopathy: Secondary | ICD-10-CM | POA: Diagnosis not present

## 2022-07-28 DIAGNOSIS — K219 Gastro-esophageal reflux disease without esophagitis: Secondary | ICD-10-CM | POA: Diagnosis not present

## 2022-07-28 DIAGNOSIS — A419 Sepsis, unspecified organism: Secondary | ICD-10-CM | POA: Diagnosis not present

## 2022-07-28 DIAGNOSIS — Z992 Dependence on renal dialysis: Secondary | ICD-10-CM | POA: Diagnosis not present

## 2022-07-28 DIAGNOSIS — R6521 Severe sepsis with septic shock: Secondary | ICD-10-CM | POA: Diagnosis not present

## 2022-07-30 DIAGNOSIS — K219 Gastro-esophageal reflux disease without esophagitis: Secondary | ICD-10-CM | POA: Diagnosis not present

## 2022-07-30 DIAGNOSIS — D631 Anemia in chronic kidney disease: Secondary | ICD-10-CM | POA: Diagnosis not present

## 2022-07-30 DIAGNOSIS — N138 Other obstructive and reflux uropathy: Secondary | ICD-10-CM | POA: Diagnosis not present

## 2022-07-30 DIAGNOSIS — N2581 Secondary hyperparathyroidism of renal origin: Secondary | ICD-10-CM | POA: Diagnosis not present

## 2022-07-30 DIAGNOSIS — I120 Hypertensive chronic kidney disease with stage 5 chronic kidney disease or end stage renal disease: Secondary | ICD-10-CM | POA: Diagnosis not present

## 2022-07-30 DIAGNOSIS — A419 Sepsis, unspecified organism: Secondary | ICD-10-CM | POA: Diagnosis not present

## 2022-07-30 DIAGNOSIS — E43 Unspecified severe protein-calorie malnutrition: Secondary | ICD-10-CM | POA: Diagnosis not present

## 2022-07-30 DIAGNOSIS — D72829 Elevated white blood cell count, unspecified: Secondary | ICD-10-CM | POA: Diagnosis not present

## 2022-07-30 DIAGNOSIS — J9601 Acute respiratory failure with hypoxia: Secondary | ICD-10-CM | POA: Diagnosis not present

## 2022-07-30 DIAGNOSIS — N186 End stage renal disease: Secondary | ICD-10-CM | POA: Diagnosis not present

## 2022-07-30 DIAGNOSIS — L719 Rosacea, unspecified: Secondary | ICD-10-CM | POA: Diagnosis not present

## 2022-07-30 DIAGNOSIS — D472 Monoclonal gammopathy: Secondary | ICD-10-CM | POA: Diagnosis not present

## 2022-07-30 DIAGNOSIS — E785 Hyperlipidemia, unspecified: Secondary | ICD-10-CM | POA: Diagnosis not present

## 2022-07-30 DIAGNOSIS — R6521 Severe sepsis with septic shock: Secondary | ICD-10-CM | POA: Diagnosis not present

## 2022-07-30 DIAGNOSIS — G9341 Metabolic encephalopathy: Secondary | ICD-10-CM | POA: Diagnosis not present

## 2022-07-30 DIAGNOSIS — J8410 Pulmonary fibrosis, unspecified: Secondary | ICD-10-CM | POA: Diagnosis not present

## 2022-07-30 DIAGNOSIS — M179 Osteoarthritis of knee, unspecified: Secondary | ICD-10-CM | POA: Diagnosis not present

## 2022-07-30 DIAGNOSIS — Z992 Dependence on renal dialysis: Secondary | ICD-10-CM | POA: Diagnosis not present

## 2022-07-30 DIAGNOSIS — H9190 Unspecified hearing loss, unspecified ear: Secondary | ICD-10-CM | POA: Diagnosis not present

## 2022-07-30 DIAGNOSIS — I447 Left bundle-branch block, unspecified: Secondary | ICD-10-CM | POA: Diagnosis not present

## 2022-07-30 DIAGNOSIS — I5042 Chronic combined systolic (congestive) and diastolic (congestive) heart failure: Secondary | ICD-10-CM | POA: Diagnosis not present

## 2022-07-30 DIAGNOSIS — J302 Other seasonal allergic rhinitis: Secondary | ICD-10-CM | POA: Diagnosis not present

## 2022-07-31 DIAGNOSIS — N186 End stage renal disease: Secondary | ICD-10-CM | POA: Diagnosis not present

## 2022-07-31 DIAGNOSIS — D631 Anemia in chronic kidney disease: Secondary | ICD-10-CM | POA: Diagnosis not present

## 2022-07-31 DIAGNOSIS — Z992 Dependence on renal dialysis: Secondary | ICD-10-CM | POA: Diagnosis not present

## 2022-07-31 DIAGNOSIS — N2581 Secondary hyperparathyroidism of renal origin: Secondary | ICD-10-CM | POA: Diagnosis not present

## 2022-07-31 DIAGNOSIS — D689 Coagulation defect, unspecified: Secondary | ICD-10-CM | POA: Diagnosis not present

## 2022-08-03 DIAGNOSIS — N2581 Secondary hyperparathyroidism of renal origin: Secondary | ICD-10-CM | POA: Diagnosis not present

## 2022-08-03 DIAGNOSIS — N186 End stage renal disease: Secondary | ICD-10-CM | POA: Diagnosis not present

## 2022-08-03 DIAGNOSIS — D631 Anemia in chronic kidney disease: Secondary | ICD-10-CM | POA: Diagnosis not present

## 2022-08-03 DIAGNOSIS — D689 Coagulation defect, unspecified: Secondary | ICD-10-CM | POA: Diagnosis not present

## 2022-08-03 DIAGNOSIS — Z992 Dependence on renal dialysis: Secondary | ICD-10-CM | POA: Diagnosis not present

## 2022-08-05 DIAGNOSIS — D631 Anemia in chronic kidney disease: Secondary | ICD-10-CM | POA: Diagnosis not present

## 2022-08-05 DIAGNOSIS — N186 End stage renal disease: Secondary | ICD-10-CM | POA: Diagnosis not present

## 2022-08-05 DIAGNOSIS — Z992 Dependence on renal dialysis: Secondary | ICD-10-CM | POA: Diagnosis not present

## 2022-08-05 DIAGNOSIS — D689 Coagulation defect, unspecified: Secondary | ICD-10-CM | POA: Diagnosis not present

## 2022-08-05 DIAGNOSIS — N2581 Secondary hyperparathyroidism of renal origin: Secondary | ICD-10-CM | POA: Diagnosis not present

## 2022-08-06 DIAGNOSIS — R6521 Severe sepsis with septic shock: Secondary | ICD-10-CM | POA: Diagnosis not present

## 2022-08-06 DIAGNOSIS — E785 Hyperlipidemia, unspecified: Secondary | ICD-10-CM | POA: Diagnosis not present

## 2022-08-06 DIAGNOSIS — I5042 Chronic combined systolic (congestive) and diastolic (congestive) heart failure: Secondary | ICD-10-CM | POA: Diagnosis not present

## 2022-08-06 DIAGNOSIS — A419 Sepsis, unspecified organism: Secondary | ICD-10-CM | POA: Diagnosis not present

## 2022-08-06 DIAGNOSIS — I447 Left bundle-branch block, unspecified: Secondary | ICD-10-CM | POA: Diagnosis not present

## 2022-08-06 DIAGNOSIS — N186 End stage renal disease: Secondary | ICD-10-CM | POA: Diagnosis not present

## 2022-08-06 DIAGNOSIS — D631 Anemia in chronic kidney disease: Secondary | ICD-10-CM | POA: Diagnosis not present

## 2022-08-06 DIAGNOSIS — N138 Other obstructive and reflux uropathy: Secondary | ICD-10-CM | POA: Diagnosis not present

## 2022-08-06 DIAGNOSIS — Z992 Dependence on renal dialysis: Secondary | ICD-10-CM | POA: Diagnosis not present

## 2022-08-06 DIAGNOSIS — J8410 Pulmonary fibrosis, unspecified: Secondary | ICD-10-CM | POA: Diagnosis not present

## 2022-08-06 DIAGNOSIS — J9601 Acute respiratory failure with hypoxia: Secondary | ICD-10-CM | POA: Diagnosis not present

## 2022-08-06 DIAGNOSIS — I120 Hypertensive chronic kidney disease with stage 5 chronic kidney disease or end stage renal disease: Secondary | ICD-10-CM | POA: Diagnosis not present

## 2022-08-06 DIAGNOSIS — E43 Unspecified severe protein-calorie malnutrition: Secondary | ICD-10-CM | POA: Diagnosis not present

## 2022-08-06 DIAGNOSIS — L719 Rosacea, unspecified: Secondary | ICD-10-CM | POA: Diagnosis not present

## 2022-08-06 DIAGNOSIS — N2581 Secondary hyperparathyroidism of renal origin: Secondary | ICD-10-CM | POA: Diagnosis not present

## 2022-08-06 DIAGNOSIS — H9190 Unspecified hearing loss, unspecified ear: Secondary | ICD-10-CM | POA: Diagnosis not present

## 2022-08-06 DIAGNOSIS — M179 Osteoarthritis of knee, unspecified: Secondary | ICD-10-CM | POA: Diagnosis not present

## 2022-08-06 DIAGNOSIS — K219 Gastro-esophageal reflux disease without esophagitis: Secondary | ICD-10-CM | POA: Diagnosis not present

## 2022-08-06 DIAGNOSIS — G9341 Metabolic encephalopathy: Secondary | ICD-10-CM | POA: Diagnosis not present

## 2022-08-06 DIAGNOSIS — D472 Monoclonal gammopathy: Secondary | ICD-10-CM | POA: Diagnosis not present

## 2022-08-06 DIAGNOSIS — J302 Other seasonal allergic rhinitis: Secondary | ICD-10-CM | POA: Diagnosis not present

## 2022-08-06 DIAGNOSIS — D72829 Elevated white blood cell count, unspecified: Secondary | ICD-10-CM | POA: Diagnosis not present

## 2022-08-07 DIAGNOSIS — Z992 Dependence on renal dialysis: Secondary | ICD-10-CM | POA: Diagnosis not present

## 2022-08-07 DIAGNOSIS — D631 Anemia in chronic kidney disease: Secondary | ICD-10-CM | POA: Diagnosis not present

## 2022-08-07 DIAGNOSIS — N186 End stage renal disease: Secondary | ICD-10-CM | POA: Diagnosis not present

## 2022-08-07 DIAGNOSIS — D689 Coagulation defect, unspecified: Secondary | ICD-10-CM | POA: Diagnosis not present

## 2022-08-07 DIAGNOSIS — N2581 Secondary hyperparathyroidism of renal origin: Secondary | ICD-10-CM | POA: Diagnosis not present

## 2022-08-10 DIAGNOSIS — N2581 Secondary hyperparathyroidism of renal origin: Secondary | ICD-10-CM | POA: Diagnosis not present

## 2022-08-10 DIAGNOSIS — N186 End stage renal disease: Secondary | ICD-10-CM | POA: Diagnosis not present

## 2022-08-10 DIAGNOSIS — D631 Anemia in chronic kidney disease: Secondary | ICD-10-CM | POA: Diagnosis not present

## 2022-08-10 DIAGNOSIS — Z992 Dependence on renal dialysis: Secondary | ICD-10-CM | POA: Diagnosis not present

## 2022-08-10 DIAGNOSIS — D689 Coagulation defect, unspecified: Secondary | ICD-10-CM | POA: Diagnosis not present

## 2022-08-13 DIAGNOSIS — N186 End stage renal disease: Secondary | ICD-10-CM | POA: Diagnosis not present

## 2022-08-13 DIAGNOSIS — Z992 Dependence on renal dialysis: Secondary | ICD-10-CM | POA: Diagnosis not present

## 2022-08-13 DIAGNOSIS — N2581 Secondary hyperparathyroidism of renal origin: Secondary | ICD-10-CM | POA: Diagnosis not present

## 2022-08-13 DIAGNOSIS — D631 Anemia in chronic kidney disease: Secondary | ICD-10-CM | POA: Diagnosis not present

## 2022-08-13 DIAGNOSIS — D689 Coagulation defect, unspecified: Secondary | ICD-10-CM | POA: Diagnosis not present

## 2022-08-14 DIAGNOSIS — N2581 Secondary hyperparathyroidism of renal origin: Secondary | ICD-10-CM | POA: Diagnosis not present

## 2022-08-14 DIAGNOSIS — D689 Coagulation defect, unspecified: Secondary | ICD-10-CM | POA: Diagnosis not present

## 2022-08-14 DIAGNOSIS — N186 End stage renal disease: Secondary | ICD-10-CM | POA: Diagnosis not present

## 2022-08-14 DIAGNOSIS — D631 Anemia in chronic kidney disease: Secondary | ICD-10-CM | POA: Diagnosis not present

## 2022-08-14 DIAGNOSIS — Z992 Dependence on renal dialysis: Secondary | ICD-10-CM | POA: Diagnosis not present

## 2022-08-17 DIAGNOSIS — N2581 Secondary hyperparathyroidism of renal origin: Secondary | ICD-10-CM | POA: Diagnosis not present

## 2022-08-17 DIAGNOSIS — N186 End stage renal disease: Secondary | ICD-10-CM | POA: Diagnosis not present

## 2022-08-17 DIAGNOSIS — D689 Coagulation defect, unspecified: Secondary | ICD-10-CM | POA: Diagnosis not present

## 2022-08-17 DIAGNOSIS — Z992 Dependence on renal dialysis: Secondary | ICD-10-CM | POA: Diagnosis not present

## 2022-08-17 DIAGNOSIS — D631 Anemia in chronic kidney disease: Secondary | ICD-10-CM | POA: Diagnosis not present

## 2022-08-18 DIAGNOSIS — J8410 Pulmonary fibrosis, unspecified: Secondary | ICD-10-CM | POA: Diagnosis not present

## 2022-08-18 DIAGNOSIS — N2581 Secondary hyperparathyroidism of renal origin: Secondary | ICD-10-CM | POA: Diagnosis not present

## 2022-08-18 DIAGNOSIS — Z992 Dependence on renal dialysis: Secondary | ICD-10-CM | POA: Diagnosis not present

## 2022-08-18 DIAGNOSIS — I120 Hypertensive chronic kidney disease with stage 5 chronic kidney disease or end stage renal disease: Secondary | ICD-10-CM | POA: Diagnosis not present

## 2022-08-18 DIAGNOSIS — M179 Osteoarthritis of knee, unspecified: Secondary | ICD-10-CM | POA: Diagnosis not present

## 2022-08-18 DIAGNOSIS — J302 Other seasonal allergic rhinitis: Secondary | ICD-10-CM | POA: Diagnosis not present

## 2022-08-18 DIAGNOSIS — D631 Anemia in chronic kidney disease: Secondary | ICD-10-CM | POA: Diagnosis not present

## 2022-08-18 DIAGNOSIS — N186 End stage renal disease: Secondary | ICD-10-CM | POA: Diagnosis not present

## 2022-08-18 DIAGNOSIS — D472 Monoclonal gammopathy: Secondary | ICD-10-CM | POA: Diagnosis not present

## 2022-08-18 DIAGNOSIS — H9190 Unspecified hearing loss, unspecified ear: Secondary | ICD-10-CM | POA: Diagnosis not present

## 2022-08-18 DIAGNOSIS — D72829 Elevated white blood cell count, unspecified: Secondary | ICD-10-CM | POA: Diagnosis not present

## 2022-08-18 DIAGNOSIS — I5042 Chronic combined systolic (congestive) and diastolic (congestive) heart failure: Secondary | ICD-10-CM | POA: Diagnosis not present

## 2022-08-18 DIAGNOSIS — E785 Hyperlipidemia, unspecified: Secondary | ICD-10-CM | POA: Diagnosis not present

## 2022-08-18 DIAGNOSIS — L719 Rosacea, unspecified: Secondary | ICD-10-CM | POA: Diagnosis not present

## 2022-08-18 DIAGNOSIS — A419 Sepsis, unspecified organism: Secondary | ICD-10-CM | POA: Diagnosis not present

## 2022-08-18 DIAGNOSIS — G9341 Metabolic encephalopathy: Secondary | ICD-10-CM | POA: Diagnosis not present

## 2022-08-18 DIAGNOSIS — I447 Left bundle-branch block, unspecified: Secondary | ICD-10-CM | POA: Diagnosis not present

## 2022-08-18 DIAGNOSIS — K219 Gastro-esophageal reflux disease without esophagitis: Secondary | ICD-10-CM | POA: Diagnosis not present

## 2022-08-18 DIAGNOSIS — R6521 Severe sepsis with septic shock: Secondary | ICD-10-CM | POA: Diagnosis not present

## 2022-08-18 DIAGNOSIS — J9601 Acute respiratory failure with hypoxia: Secondary | ICD-10-CM | POA: Diagnosis not present

## 2022-08-18 DIAGNOSIS — N138 Other obstructive and reflux uropathy: Secondary | ICD-10-CM | POA: Diagnosis not present

## 2022-08-18 DIAGNOSIS — E43 Unspecified severe protein-calorie malnutrition: Secondary | ICD-10-CM | POA: Diagnosis not present

## 2022-08-19 DIAGNOSIS — N186 End stage renal disease: Secondary | ICD-10-CM | POA: Diagnosis not present

## 2022-08-19 DIAGNOSIS — Z992 Dependence on renal dialysis: Secondary | ICD-10-CM | POA: Diagnosis not present

## 2022-08-19 DIAGNOSIS — D689 Coagulation defect, unspecified: Secondary | ICD-10-CM | POA: Diagnosis not present

## 2022-08-19 DIAGNOSIS — N2581 Secondary hyperparathyroidism of renal origin: Secondary | ICD-10-CM | POA: Diagnosis not present

## 2022-08-19 DIAGNOSIS — D631 Anemia in chronic kidney disease: Secondary | ICD-10-CM | POA: Diagnosis not present

## 2022-08-21 DIAGNOSIS — D631 Anemia in chronic kidney disease: Secondary | ICD-10-CM | POA: Diagnosis not present

## 2022-08-21 DIAGNOSIS — N186 End stage renal disease: Secondary | ICD-10-CM | POA: Diagnosis not present

## 2022-08-21 DIAGNOSIS — N2581 Secondary hyperparathyroidism of renal origin: Secondary | ICD-10-CM | POA: Diagnosis not present

## 2022-08-21 DIAGNOSIS — D689 Coagulation defect, unspecified: Secondary | ICD-10-CM | POA: Diagnosis not present

## 2022-08-21 DIAGNOSIS — Z992 Dependence on renal dialysis: Secondary | ICD-10-CM | POA: Diagnosis not present

## 2022-08-24 DIAGNOSIS — Z992 Dependence on renal dialysis: Secondary | ICD-10-CM | POA: Diagnosis not present

## 2022-08-24 DIAGNOSIS — D689 Coagulation defect, unspecified: Secondary | ICD-10-CM | POA: Diagnosis not present

## 2022-08-24 DIAGNOSIS — N2581 Secondary hyperparathyroidism of renal origin: Secondary | ICD-10-CM | POA: Diagnosis not present

## 2022-08-24 DIAGNOSIS — D631 Anemia in chronic kidney disease: Secondary | ICD-10-CM | POA: Diagnosis not present

## 2022-08-24 DIAGNOSIS — N186 End stage renal disease: Secondary | ICD-10-CM | POA: Diagnosis not present

## 2022-08-24 DIAGNOSIS — I129 Hypertensive chronic kidney disease with stage 1 through stage 4 chronic kidney disease, or unspecified chronic kidney disease: Secondary | ICD-10-CM | POA: Diagnosis not present

## 2022-08-25 DIAGNOSIS — J9601 Acute respiratory failure with hypoxia: Secondary | ICD-10-CM | POA: Diagnosis not present

## 2022-08-25 DIAGNOSIS — J209 Acute bronchitis, unspecified: Secondary | ICD-10-CM | POA: Diagnosis not present

## 2022-08-25 DIAGNOSIS — R0602 Shortness of breath: Secondary | ICD-10-CM | POA: Diagnosis not present

## 2022-08-26 DIAGNOSIS — D689 Coagulation defect, unspecified: Secondary | ICD-10-CM | POA: Diagnosis not present

## 2022-08-26 DIAGNOSIS — N2581 Secondary hyperparathyroidism of renal origin: Secondary | ICD-10-CM | POA: Diagnosis not present

## 2022-08-26 DIAGNOSIS — N186 End stage renal disease: Secondary | ICD-10-CM | POA: Diagnosis not present

## 2022-08-26 DIAGNOSIS — Z992 Dependence on renal dialysis: Secondary | ICD-10-CM | POA: Diagnosis not present

## 2022-08-27 DIAGNOSIS — Z681 Body mass index (BMI) 19 or less, adult: Secondary | ICD-10-CM | POA: Diagnosis not present

## 2022-08-27 DIAGNOSIS — N390 Urinary tract infection, site not specified: Secondary | ICD-10-CM | POA: Diagnosis not present

## 2022-08-27 DIAGNOSIS — Z992 Dependence on renal dialysis: Secondary | ICD-10-CM | POA: Diagnosis not present

## 2022-08-27 DIAGNOSIS — N186 End stage renal disease: Secondary | ICD-10-CM | POA: Diagnosis not present

## 2022-08-28 DIAGNOSIS — N2581 Secondary hyperparathyroidism of renal origin: Secondary | ICD-10-CM | POA: Diagnosis not present

## 2022-08-28 DIAGNOSIS — D689 Coagulation defect, unspecified: Secondary | ICD-10-CM | POA: Diagnosis not present

## 2022-08-28 DIAGNOSIS — Z992 Dependence on renal dialysis: Secondary | ICD-10-CM | POA: Diagnosis not present

## 2022-08-28 DIAGNOSIS — N186 End stage renal disease: Secondary | ICD-10-CM | POA: Diagnosis not present

## 2022-08-31 DIAGNOSIS — N186 End stage renal disease: Secondary | ICD-10-CM | POA: Diagnosis not present

## 2022-08-31 DIAGNOSIS — Z992 Dependence on renal dialysis: Secondary | ICD-10-CM | POA: Diagnosis not present

## 2022-08-31 DIAGNOSIS — D689 Coagulation defect, unspecified: Secondary | ICD-10-CM | POA: Diagnosis not present

## 2022-08-31 DIAGNOSIS — N2581 Secondary hyperparathyroidism of renal origin: Secondary | ICD-10-CM | POA: Diagnosis not present

## 2022-09-01 DIAGNOSIS — D485 Neoplasm of uncertain behavior of skin: Secondary | ICD-10-CM | POA: Diagnosis not present

## 2022-09-01 DIAGNOSIS — Z08 Encounter for follow-up examination after completed treatment for malignant neoplasm: Secondary | ICD-10-CM | POA: Diagnosis not present

## 2022-09-01 DIAGNOSIS — Z86007 Personal history of in-situ neoplasm of skin: Secondary | ICD-10-CM | POA: Diagnosis not present

## 2022-09-01 DIAGNOSIS — D0439 Carcinoma in situ of skin of other parts of face: Secondary | ICD-10-CM | POA: Diagnosis not present

## 2022-09-01 DIAGNOSIS — R229 Localized swelling, mass and lump, unspecified: Secondary | ICD-10-CM | POA: Diagnosis not present

## 2022-09-03 DIAGNOSIS — I5032 Chronic diastolic (congestive) heart failure: Secondary | ICD-10-CM | POA: Diagnosis not present

## 2022-09-04 DIAGNOSIS — N186 End stage renal disease: Secondary | ICD-10-CM | POA: Diagnosis not present

## 2022-09-04 DIAGNOSIS — N2581 Secondary hyperparathyroidism of renal origin: Secondary | ICD-10-CM | POA: Diagnosis not present

## 2022-09-04 DIAGNOSIS — Z992 Dependence on renal dialysis: Secondary | ICD-10-CM | POA: Diagnosis not present

## 2022-09-04 DIAGNOSIS — D689 Coagulation defect, unspecified: Secondary | ICD-10-CM | POA: Diagnosis not present

## 2022-09-07 DIAGNOSIS — Z992 Dependence on renal dialysis: Secondary | ICD-10-CM | POA: Diagnosis not present

## 2022-09-07 DIAGNOSIS — D689 Coagulation defect, unspecified: Secondary | ICD-10-CM | POA: Diagnosis not present

## 2022-09-07 DIAGNOSIS — N186 End stage renal disease: Secondary | ICD-10-CM | POA: Diagnosis not present

## 2022-09-07 DIAGNOSIS — N2581 Secondary hyperparathyroidism of renal origin: Secondary | ICD-10-CM | POA: Diagnosis not present

## 2022-09-09 DIAGNOSIS — Z992 Dependence on renal dialysis: Secondary | ICD-10-CM | POA: Diagnosis not present

## 2022-09-09 DIAGNOSIS — N186 End stage renal disease: Secondary | ICD-10-CM | POA: Diagnosis not present

## 2022-09-09 DIAGNOSIS — N2581 Secondary hyperparathyroidism of renal origin: Secondary | ICD-10-CM | POA: Diagnosis not present

## 2022-09-09 DIAGNOSIS — D689 Coagulation defect, unspecified: Secondary | ICD-10-CM | POA: Diagnosis not present

## 2022-09-11 DIAGNOSIS — Z992 Dependence on renal dialysis: Secondary | ICD-10-CM | POA: Diagnosis not present

## 2022-09-11 DIAGNOSIS — N2581 Secondary hyperparathyroidism of renal origin: Secondary | ICD-10-CM | POA: Diagnosis not present

## 2022-09-11 DIAGNOSIS — D689 Coagulation defect, unspecified: Secondary | ICD-10-CM | POA: Diagnosis not present

## 2022-09-11 DIAGNOSIS — N186 End stage renal disease: Secondary | ICD-10-CM | POA: Diagnosis not present

## 2022-09-16 DIAGNOSIS — N186 End stage renal disease: Secondary | ICD-10-CM | POA: Diagnosis not present

## 2022-09-16 DIAGNOSIS — D689 Coagulation defect, unspecified: Secondary | ICD-10-CM | POA: Diagnosis not present

## 2022-09-16 DIAGNOSIS — Z992 Dependence on renal dialysis: Secondary | ICD-10-CM | POA: Diagnosis not present

## 2022-09-16 DIAGNOSIS — N2581 Secondary hyperparathyroidism of renal origin: Secondary | ICD-10-CM | POA: Diagnosis not present

## 2022-09-18 DIAGNOSIS — Z992 Dependence on renal dialysis: Secondary | ICD-10-CM | POA: Diagnosis not present

## 2022-09-18 DIAGNOSIS — D689 Coagulation defect, unspecified: Secondary | ICD-10-CM | POA: Diagnosis not present

## 2022-09-18 DIAGNOSIS — N186 End stage renal disease: Secondary | ICD-10-CM | POA: Diagnosis not present

## 2022-09-18 DIAGNOSIS — N2581 Secondary hyperparathyroidism of renal origin: Secondary | ICD-10-CM | POA: Diagnosis not present

## 2022-09-21 DIAGNOSIS — N2581 Secondary hyperparathyroidism of renal origin: Secondary | ICD-10-CM | POA: Diagnosis not present

## 2022-09-21 DIAGNOSIS — Z992 Dependence on renal dialysis: Secondary | ICD-10-CM | POA: Diagnosis not present

## 2022-09-21 DIAGNOSIS — D689 Coagulation defect, unspecified: Secondary | ICD-10-CM | POA: Diagnosis not present

## 2022-09-21 DIAGNOSIS — N186 End stage renal disease: Secondary | ICD-10-CM | POA: Diagnosis not present

## 2022-09-23 DIAGNOSIS — D689 Coagulation defect, unspecified: Secondary | ICD-10-CM | POA: Diagnosis not present

## 2022-09-23 DIAGNOSIS — Z992 Dependence on renal dialysis: Secondary | ICD-10-CM | POA: Diagnosis not present

## 2022-09-23 DIAGNOSIS — N2581 Secondary hyperparathyroidism of renal origin: Secondary | ICD-10-CM | POA: Diagnosis not present

## 2022-09-23 DIAGNOSIS — N186 End stage renal disease: Secondary | ICD-10-CM | POA: Diagnosis not present

## 2022-09-24 DIAGNOSIS — I129 Hypertensive chronic kidney disease with stage 1 through stage 4 chronic kidney disease, or unspecified chronic kidney disease: Secondary | ICD-10-CM | POA: Diagnosis not present

## 2022-09-24 DIAGNOSIS — Z992 Dependence on renal dialysis: Secondary | ICD-10-CM | POA: Diagnosis not present

## 2022-09-24 DIAGNOSIS — N186 End stage renal disease: Secondary | ICD-10-CM | POA: Diagnosis not present

## 2022-09-25 DIAGNOSIS — R0602 Shortness of breath: Secondary | ICD-10-CM | POA: Diagnosis not present

## 2022-09-25 DIAGNOSIS — D689 Coagulation defect, unspecified: Secondary | ICD-10-CM | POA: Diagnosis not present

## 2022-09-25 DIAGNOSIS — N186 End stage renal disease: Secondary | ICD-10-CM | POA: Diagnosis not present

## 2022-09-25 DIAGNOSIS — J209 Acute bronchitis, unspecified: Secondary | ICD-10-CM | POA: Diagnosis not present

## 2022-09-25 DIAGNOSIS — J9601 Acute respiratory failure with hypoxia: Secondary | ICD-10-CM | POA: Diagnosis not present

## 2022-09-25 DIAGNOSIS — N2581 Secondary hyperparathyroidism of renal origin: Secondary | ICD-10-CM | POA: Diagnosis not present

## 2022-09-25 DIAGNOSIS — Z992 Dependence on renal dialysis: Secondary | ICD-10-CM | POA: Diagnosis not present

## 2022-09-30 DIAGNOSIS — D689 Coagulation defect, unspecified: Secondary | ICD-10-CM | POA: Diagnosis not present

## 2022-09-30 DIAGNOSIS — Z992 Dependence on renal dialysis: Secondary | ICD-10-CM | POA: Diagnosis not present

## 2022-09-30 DIAGNOSIS — N186 End stage renal disease: Secondary | ICD-10-CM | POA: Diagnosis not present

## 2022-09-30 DIAGNOSIS — N2581 Secondary hyperparathyroidism of renal origin: Secondary | ICD-10-CM | POA: Diagnosis not present

## 2022-10-02 DIAGNOSIS — N2581 Secondary hyperparathyroidism of renal origin: Secondary | ICD-10-CM | POA: Diagnosis not present

## 2022-10-02 DIAGNOSIS — Z992 Dependence on renal dialysis: Secondary | ICD-10-CM | POA: Diagnosis not present

## 2022-10-02 DIAGNOSIS — N186 End stage renal disease: Secondary | ICD-10-CM | POA: Diagnosis not present

## 2022-10-02 DIAGNOSIS — D689 Coagulation defect, unspecified: Secondary | ICD-10-CM | POA: Diagnosis not present

## 2022-10-05 DIAGNOSIS — Z992 Dependence on renal dialysis: Secondary | ICD-10-CM | POA: Diagnosis not present

## 2022-10-05 DIAGNOSIS — N2581 Secondary hyperparathyroidism of renal origin: Secondary | ICD-10-CM | POA: Diagnosis not present

## 2022-10-05 DIAGNOSIS — D689 Coagulation defect, unspecified: Secondary | ICD-10-CM | POA: Diagnosis not present

## 2022-10-05 DIAGNOSIS — N186 End stage renal disease: Secondary | ICD-10-CM | POA: Diagnosis not present

## 2022-10-07 DIAGNOSIS — D689 Coagulation defect, unspecified: Secondary | ICD-10-CM | POA: Diagnosis not present

## 2022-10-07 DIAGNOSIS — N2581 Secondary hyperparathyroidism of renal origin: Secondary | ICD-10-CM | POA: Diagnosis not present

## 2022-10-07 DIAGNOSIS — N186 End stage renal disease: Secondary | ICD-10-CM | POA: Diagnosis not present

## 2022-10-07 DIAGNOSIS — Z992 Dependence on renal dialysis: Secondary | ICD-10-CM | POA: Diagnosis not present

## 2022-10-09 DIAGNOSIS — N2581 Secondary hyperparathyroidism of renal origin: Secondary | ICD-10-CM | POA: Diagnosis not present

## 2022-10-09 DIAGNOSIS — N186 End stage renal disease: Secondary | ICD-10-CM | POA: Diagnosis not present

## 2022-10-09 DIAGNOSIS — D689 Coagulation defect, unspecified: Secondary | ICD-10-CM | POA: Diagnosis not present

## 2022-10-09 DIAGNOSIS — Z992 Dependence on renal dialysis: Secondary | ICD-10-CM | POA: Diagnosis not present

## 2022-10-12 DIAGNOSIS — N186 End stage renal disease: Secondary | ICD-10-CM | POA: Diagnosis not present

## 2022-10-12 DIAGNOSIS — Z992 Dependence on renal dialysis: Secondary | ICD-10-CM | POA: Diagnosis not present

## 2022-10-12 DIAGNOSIS — D689 Coagulation defect, unspecified: Secondary | ICD-10-CM | POA: Diagnosis not present

## 2022-10-12 DIAGNOSIS — N2581 Secondary hyperparathyroidism of renal origin: Secondary | ICD-10-CM | POA: Diagnosis not present

## 2022-10-14 DIAGNOSIS — D689 Coagulation defect, unspecified: Secondary | ICD-10-CM | POA: Diagnosis not present

## 2022-10-14 DIAGNOSIS — N2581 Secondary hyperparathyroidism of renal origin: Secondary | ICD-10-CM | POA: Diagnosis not present

## 2022-10-14 DIAGNOSIS — Z992 Dependence on renal dialysis: Secondary | ICD-10-CM | POA: Diagnosis not present

## 2022-10-14 DIAGNOSIS — N186 End stage renal disease: Secondary | ICD-10-CM | POA: Diagnosis not present

## 2022-10-16 DIAGNOSIS — N2581 Secondary hyperparathyroidism of renal origin: Secondary | ICD-10-CM | POA: Diagnosis not present

## 2022-10-16 DIAGNOSIS — D689 Coagulation defect, unspecified: Secondary | ICD-10-CM | POA: Diagnosis not present

## 2022-10-16 DIAGNOSIS — N186 End stage renal disease: Secondary | ICD-10-CM | POA: Diagnosis not present

## 2022-10-16 DIAGNOSIS — Z992 Dependence on renal dialysis: Secondary | ICD-10-CM | POA: Diagnosis not present

## 2022-10-19 DIAGNOSIS — D689 Coagulation defect, unspecified: Secondary | ICD-10-CM | POA: Diagnosis not present

## 2022-10-19 DIAGNOSIS — Z992 Dependence on renal dialysis: Secondary | ICD-10-CM | POA: Diagnosis not present

## 2022-10-19 DIAGNOSIS — N2581 Secondary hyperparathyroidism of renal origin: Secondary | ICD-10-CM | POA: Diagnosis not present

## 2022-10-19 DIAGNOSIS — N186 End stage renal disease: Secondary | ICD-10-CM | POA: Diagnosis not present

## 2022-10-21 DIAGNOSIS — N186 End stage renal disease: Secondary | ICD-10-CM | POA: Diagnosis not present

## 2022-10-21 DIAGNOSIS — N2581 Secondary hyperparathyroidism of renal origin: Secondary | ICD-10-CM | POA: Diagnosis not present

## 2022-10-21 DIAGNOSIS — D689 Coagulation defect, unspecified: Secondary | ICD-10-CM | POA: Diagnosis not present

## 2022-10-21 DIAGNOSIS — Z992 Dependence on renal dialysis: Secondary | ICD-10-CM | POA: Diagnosis not present

## 2022-10-23 DIAGNOSIS — N2581 Secondary hyperparathyroidism of renal origin: Secondary | ICD-10-CM | POA: Diagnosis not present

## 2022-10-23 DIAGNOSIS — Z992 Dependence on renal dialysis: Secondary | ICD-10-CM | POA: Diagnosis not present

## 2022-10-23 DIAGNOSIS — D689 Coagulation defect, unspecified: Secondary | ICD-10-CM | POA: Diagnosis not present

## 2022-10-23 DIAGNOSIS — N186 End stage renal disease: Secondary | ICD-10-CM | POA: Diagnosis not present

## 2022-10-24 DIAGNOSIS — N186 End stage renal disease: Secondary | ICD-10-CM | POA: Diagnosis not present

## 2022-10-24 DIAGNOSIS — I129 Hypertensive chronic kidney disease with stage 1 through stage 4 chronic kidney disease, or unspecified chronic kidney disease: Secondary | ICD-10-CM | POA: Diagnosis not present

## 2022-10-24 DIAGNOSIS — Z992 Dependence on renal dialysis: Secondary | ICD-10-CM | POA: Diagnosis not present

## 2022-10-25 DIAGNOSIS — J9601 Acute respiratory failure with hypoxia: Secondary | ICD-10-CM | POA: Diagnosis not present

## 2022-10-25 DIAGNOSIS — R0602 Shortness of breath: Secondary | ICD-10-CM | POA: Diagnosis not present

## 2022-10-25 DIAGNOSIS — J209 Acute bronchitis, unspecified: Secondary | ICD-10-CM | POA: Diagnosis not present

## 2022-10-26 DIAGNOSIS — N186 End stage renal disease: Secondary | ICD-10-CM | POA: Diagnosis not present

## 2022-10-26 DIAGNOSIS — N2581 Secondary hyperparathyroidism of renal origin: Secondary | ICD-10-CM | POA: Diagnosis not present

## 2022-10-26 DIAGNOSIS — T8249XD Other complication of vascular dialysis catheter, subsequent encounter: Secondary | ICD-10-CM | POA: Diagnosis not present

## 2022-10-26 DIAGNOSIS — Z992 Dependence on renal dialysis: Secondary | ICD-10-CM | POA: Diagnosis not present

## 2022-10-26 DIAGNOSIS — D689 Coagulation defect, unspecified: Secondary | ICD-10-CM | POA: Diagnosis not present

## 2022-10-28 DIAGNOSIS — D689 Coagulation defect, unspecified: Secondary | ICD-10-CM | POA: Diagnosis not present

## 2022-10-28 DIAGNOSIS — N186 End stage renal disease: Secondary | ICD-10-CM | POA: Diagnosis not present

## 2022-10-28 DIAGNOSIS — N2581 Secondary hyperparathyroidism of renal origin: Secondary | ICD-10-CM | POA: Diagnosis not present

## 2022-10-28 DIAGNOSIS — T8249XD Other complication of vascular dialysis catheter, subsequent encounter: Secondary | ICD-10-CM | POA: Diagnosis not present

## 2022-10-28 DIAGNOSIS — Z992 Dependence on renal dialysis: Secondary | ICD-10-CM | POA: Diagnosis not present

## 2022-11-02 DIAGNOSIS — N186 End stage renal disease: Secondary | ICD-10-CM | POA: Diagnosis not present

## 2022-11-02 DIAGNOSIS — Z992 Dependence on renal dialysis: Secondary | ICD-10-CM | POA: Diagnosis not present

## 2022-11-02 DIAGNOSIS — T8249XD Other complication of vascular dialysis catheter, subsequent encounter: Secondary | ICD-10-CM | POA: Diagnosis not present

## 2022-11-02 DIAGNOSIS — D689 Coagulation defect, unspecified: Secondary | ICD-10-CM | POA: Diagnosis not present

## 2022-11-02 DIAGNOSIS — N2581 Secondary hyperparathyroidism of renal origin: Secondary | ICD-10-CM | POA: Diagnosis not present

## 2022-11-04 DIAGNOSIS — Z992 Dependence on renal dialysis: Secondary | ICD-10-CM | POA: Diagnosis not present

## 2022-11-04 DIAGNOSIS — D689 Coagulation defect, unspecified: Secondary | ICD-10-CM | POA: Diagnosis not present

## 2022-11-04 DIAGNOSIS — N2581 Secondary hyperparathyroidism of renal origin: Secondary | ICD-10-CM | POA: Diagnosis not present

## 2022-11-04 DIAGNOSIS — N186 End stage renal disease: Secondary | ICD-10-CM | POA: Diagnosis not present

## 2022-11-04 DIAGNOSIS — T8249XD Other complication of vascular dialysis catheter, subsequent encounter: Secondary | ICD-10-CM | POA: Diagnosis not present

## 2022-11-05 DIAGNOSIS — T82858A Stenosis of vascular prosthetic devices, implants and grafts, initial encounter: Secondary | ICD-10-CM | POA: Diagnosis not present

## 2022-11-05 DIAGNOSIS — I871 Compression of vein: Secondary | ICD-10-CM | POA: Diagnosis not present

## 2022-11-05 DIAGNOSIS — Z992 Dependence on renal dialysis: Secondary | ICD-10-CM | POA: Diagnosis not present

## 2022-11-05 DIAGNOSIS — N186 End stage renal disease: Secondary | ICD-10-CM | POA: Diagnosis not present

## 2022-11-08 DIAGNOSIS — Z992 Dependence on renal dialysis: Secondary | ICD-10-CM | POA: Diagnosis not present

## 2022-11-08 DIAGNOSIS — N186 End stage renal disease: Secondary | ICD-10-CM | POA: Diagnosis not present

## 2022-11-08 DIAGNOSIS — T82868A Thrombosis of vascular prosthetic devices, implants and grafts, initial encounter: Secondary | ICD-10-CM | POA: Diagnosis not present

## 2022-11-09 DIAGNOSIS — T8249XD Other complication of vascular dialysis catheter, subsequent encounter: Secondary | ICD-10-CM | POA: Diagnosis not present

## 2022-11-09 DIAGNOSIS — Z992 Dependence on renal dialysis: Secondary | ICD-10-CM | POA: Diagnosis not present

## 2022-11-09 DIAGNOSIS — N186 End stage renal disease: Secondary | ICD-10-CM | POA: Diagnosis not present

## 2022-11-09 DIAGNOSIS — D689 Coagulation defect, unspecified: Secondary | ICD-10-CM | POA: Diagnosis not present

## 2022-11-09 DIAGNOSIS — N2581 Secondary hyperparathyroidism of renal origin: Secondary | ICD-10-CM | POA: Diagnosis not present

## 2022-11-11 DIAGNOSIS — Z992 Dependence on renal dialysis: Secondary | ICD-10-CM | POA: Diagnosis not present

## 2022-11-11 DIAGNOSIS — T8249XD Other complication of vascular dialysis catheter, subsequent encounter: Secondary | ICD-10-CM | POA: Diagnosis not present

## 2022-11-11 DIAGNOSIS — N2581 Secondary hyperparathyroidism of renal origin: Secondary | ICD-10-CM | POA: Diagnosis not present

## 2022-11-11 DIAGNOSIS — N186 End stage renal disease: Secondary | ICD-10-CM | POA: Diagnosis not present

## 2022-11-11 DIAGNOSIS — D689 Coagulation defect, unspecified: Secondary | ICD-10-CM | POA: Diagnosis not present

## 2022-11-13 DIAGNOSIS — N186 End stage renal disease: Secondary | ICD-10-CM | POA: Diagnosis not present

## 2022-11-13 DIAGNOSIS — Z992 Dependence on renal dialysis: Secondary | ICD-10-CM | POA: Diagnosis not present

## 2022-11-13 DIAGNOSIS — T8249XD Other complication of vascular dialysis catheter, subsequent encounter: Secondary | ICD-10-CM | POA: Diagnosis not present

## 2022-11-13 DIAGNOSIS — N2581 Secondary hyperparathyroidism of renal origin: Secondary | ICD-10-CM | POA: Diagnosis not present

## 2022-11-13 DIAGNOSIS — D689 Coagulation defect, unspecified: Secondary | ICD-10-CM | POA: Diagnosis not present

## 2022-11-16 DIAGNOSIS — N2581 Secondary hyperparathyroidism of renal origin: Secondary | ICD-10-CM | POA: Diagnosis not present

## 2022-11-16 DIAGNOSIS — T8249XD Other complication of vascular dialysis catheter, subsequent encounter: Secondary | ICD-10-CM | POA: Diagnosis not present

## 2022-11-16 DIAGNOSIS — D689 Coagulation defect, unspecified: Secondary | ICD-10-CM | POA: Diagnosis not present

## 2022-11-16 DIAGNOSIS — Z992 Dependence on renal dialysis: Secondary | ICD-10-CM | POA: Diagnosis not present

## 2022-11-16 DIAGNOSIS — N186 End stage renal disease: Secondary | ICD-10-CM | POA: Diagnosis not present

## 2022-11-18 DIAGNOSIS — D689 Coagulation defect, unspecified: Secondary | ICD-10-CM | POA: Diagnosis not present

## 2022-11-18 DIAGNOSIS — Z992 Dependence on renal dialysis: Secondary | ICD-10-CM | POA: Diagnosis not present

## 2022-11-18 DIAGNOSIS — N2581 Secondary hyperparathyroidism of renal origin: Secondary | ICD-10-CM | POA: Diagnosis not present

## 2022-11-18 DIAGNOSIS — N186 End stage renal disease: Secondary | ICD-10-CM | POA: Diagnosis not present

## 2022-11-18 DIAGNOSIS — T8249XD Other complication of vascular dialysis catheter, subsequent encounter: Secondary | ICD-10-CM | POA: Diagnosis not present

## 2022-11-20 DIAGNOSIS — N186 End stage renal disease: Secondary | ICD-10-CM | POA: Diagnosis not present

## 2022-11-20 DIAGNOSIS — T8249XD Other complication of vascular dialysis catheter, subsequent encounter: Secondary | ICD-10-CM | POA: Diagnosis not present

## 2022-11-20 DIAGNOSIS — N2581 Secondary hyperparathyroidism of renal origin: Secondary | ICD-10-CM | POA: Diagnosis not present

## 2022-11-20 DIAGNOSIS — D689 Coagulation defect, unspecified: Secondary | ICD-10-CM | POA: Diagnosis not present

## 2022-11-20 DIAGNOSIS — Z992 Dependence on renal dialysis: Secondary | ICD-10-CM | POA: Diagnosis not present

## 2022-11-23 DIAGNOSIS — N186 End stage renal disease: Secondary | ICD-10-CM | POA: Diagnosis not present

## 2022-11-23 DIAGNOSIS — T8249XD Other complication of vascular dialysis catheter, subsequent encounter: Secondary | ICD-10-CM | POA: Diagnosis not present

## 2022-11-23 DIAGNOSIS — D689 Coagulation defect, unspecified: Secondary | ICD-10-CM | POA: Diagnosis not present

## 2022-11-23 DIAGNOSIS — Z992 Dependence on renal dialysis: Secondary | ICD-10-CM | POA: Diagnosis not present

## 2022-11-23 DIAGNOSIS — N2581 Secondary hyperparathyroidism of renal origin: Secondary | ICD-10-CM | POA: Diagnosis not present

## 2022-11-24 DIAGNOSIS — I129 Hypertensive chronic kidney disease with stage 1 through stage 4 chronic kidney disease, or unspecified chronic kidney disease: Secondary | ICD-10-CM | POA: Diagnosis not present

## 2022-11-24 DIAGNOSIS — Z992 Dependence on renal dialysis: Secondary | ICD-10-CM | POA: Diagnosis not present

## 2022-11-24 DIAGNOSIS — N186 End stage renal disease: Secondary | ICD-10-CM | POA: Diagnosis not present

## 2022-11-25 DIAGNOSIS — N186 End stage renal disease: Secondary | ICD-10-CM | POA: Diagnosis not present

## 2022-11-25 DIAGNOSIS — J9601 Acute respiratory failure with hypoxia: Secondary | ICD-10-CM | POA: Diagnosis not present

## 2022-11-25 DIAGNOSIS — Z992 Dependence on renal dialysis: Secondary | ICD-10-CM | POA: Diagnosis not present

## 2022-11-25 DIAGNOSIS — T8249XD Other complication of vascular dialysis catheter, subsequent encounter: Secondary | ICD-10-CM | POA: Diagnosis not present

## 2022-11-25 DIAGNOSIS — D689 Coagulation defect, unspecified: Secondary | ICD-10-CM | POA: Diagnosis not present

## 2022-11-25 DIAGNOSIS — N2581 Secondary hyperparathyroidism of renal origin: Secondary | ICD-10-CM | POA: Diagnosis not present

## 2022-11-25 DIAGNOSIS — J209 Acute bronchitis, unspecified: Secondary | ICD-10-CM | POA: Diagnosis not present

## 2022-11-25 DIAGNOSIS — R0602 Shortness of breath: Secondary | ICD-10-CM | POA: Diagnosis not present

## 2022-11-27 DIAGNOSIS — N186 End stage renal disease: Secondary | ICD-10-CM | POA: Diagnosis not present

## 2022-11-27 DIAGNOSIS — T8249XD Other complication of vascular dialysis catheter, subsequent encounter: Secondary | ICD-10-CM | POA: Diagnosis not present

## 2022-11-27 DIAGNOSIS — D689 Coagulation defect, unspecified: Secondary | ICD-10-CM | POA: Diagnosis not present

## 2022-11-27 DIAGNOSIS — Z992 Dependence on renal dialysis: Secondary | ICD-10-CM | POA: Diagnosis not present

## 2022-11-27 DIAGNOSIS — N2581 Secondary hyperparathyroidism of renal origin: Secondary | ICD-10-CM | POA: Diagnosis not present

## 2022-11-30 DIAGNOSIS — D689 Coagulation defect, unspecified: Secondary | ICD-10-CM | POA: Diagnosis not present

## 2022-11-30 DIAGNOSIS — Z992 Dependence on renal dialysis: Secondary | ICD-10-CM | POA: Diagnosis not present

## 2022-11-30 DIAGNOSIS — N186 End stage renal disease: Secondary | ICD-10-CM | POA: Diagnosis not present

## 2022-11-30 DIAGNOSIS — T8249XD Other complication of vascular dialysis catheter, subsequent encounter: Secondary | ICD-10-CM | POA: Diagnosis not present

## 2022-11-30 DIAGNOSIS — N2581 Secondary hyperparathyroidism of renal origin: Secondary | ICD-10-CM | POA: Diagnosis not present

## 2022-12-01 DIAGNOSIS — J301 Allergic rhinitis due to pollen: Secondary | ICD-10-CM | POA: Diagnosis not present

## 2022-12-01 DIAGNOSIS — Z993 Dependence on wheelchair: Secondary | ICD-10-CM | POA: Diagnosis not present

## 2022-12-01 DIAGNOSIS — N2581 Secondary hyperparathyroidism of renal origin: Secondary | ICD-10-CM | POA: Diagnosis not present

## 2022-12-03 DIAGNOSIS — T8249XD Other complication of vascular dialysis catheter, subsequent encounter: Secondary | ICD-10-CM | POA: Diagnosis not present

## 2022-12-03 DIAGNOSIS — N2581 Secondary hyperparathyroidism of renal origin: Secondary | ICD-10-CM | POA: Diagnosis not present

## 2022-12-03 DIAGNOSIS — D689 Coagulation defect, unspecified: Secondary | ICD-10-CM | POA: Diagnosis not present

## 2022-12-03 DIAGNOSIS — N186 End stage renal disease: Secondary | ICD-10-CM | POA: Diagnosis not present

## 2022-12-03 DIAGNOSIS — Z992 Dependence on renal dialysis: Secondary | ICD-10-CM | POA: Diagnosis not present

## 2022-12-04 DIAGNOSIS — N2581 Secondary hyperparathyroidism of renal origin: Secondary | ICD-10-CM | POA: Diagnosis not present

## 2022-12-04 DIAGNOSIS — Z992 Dependence on renal dialysis: Secondary | ICD-10-CM | POA: Diagnosis not present

## 2022-12-04 DIAGNOSIS — T8249XD Other complication of vascular dialysis catheter, subsequent encounter: Secondary | ICD-10-CM | POA: Diagnosis not present

## 2022-12-04 DIAGNOSIS — N186 End stage renal disease: Secondary | ICD-10-CM | POA: Diagnosis not present

## 2022-12-04 DIAGNOSIS — D689 Coagulation defect, unspecified: Secondary | ICD-10-CM | POA: Diagnosis not present

## 2022-12-07 DIAGNOSIS — Z992 Dependence on renal dialysis: Secondary | ICD-10-CM | POA: Diagnosis not present

## 2022-12-07 DIAGNOSIS — T8249XD Other complication of vascular dialysis catheter, subsequent encounter: Secondary | ICD-10-CM | POA: Diagnosis not present

## 2022-12-07 DIAGNOSIS — N186 End stage renal disease: Secondary | ICD-10-CM | POA: Diagnosis not present

## 2022-12-07 DIAGNOSIS — D689 Coagulation defect, unspecified: Secondary | ICD-10-CM | POA: Diagnosis not present

## 2022-12-07 DIAGNOSIS — N2581 Secondary hyperparathyroidism of renal origin: Secondary | ICD-10-CM | POA: Diagnosis not present

## 2022-12-09 DIAGNOSIS — Z992 Dependence on renal dialysis: Secondary | ICD-10-CM | POA: Diagnosis not present

## 2022-12-09 DIAGNOSIS — N2581 Secondary hyperparathyroidism of renal origin: Secondary | ICD-10-CM | POA: Diagnosis not present

## 2022-12-09 DIAGNOSIS — D689 Coagulation defect, unspecified: Secondary | ICD-10-CM | POA: Diagnosis not present

## 2022-12-09 DIAGNOSIS — T8249XD Other complication of vascular dialysis catheter, subsequent encounter: Secondary | ICD-10-CM | POA: Diagnosis not present

## 2022-12-09 DIAGNOSIS — N186 End stage renal disease: Secondary | ICD-10-CM | POA: Diagnosis not present

## 2022-12-11 ENCOUNTER — Emergency Department (HOSPITAL_COMMUNITY): Payer: Medicare Other

## 2022-12-11 ENCOUNTER — Other Ambulatory Visit: Payer: Self-pay

## 2022-12-11 ENCOUNTER — Inpatient Hospital Stay (HOSPITAL_COMMUNITY)
Admission: EM | Admit: 2022-12-11 | Discharge: 2022-12-26 | DRG: 189 | Disposition: E | Payer: Medicare Other | Source: Skilled Nursing Facility | Attending: Family Medicine | Admitting: Family Medicine

## 2022-12-11 ENCOUNTER — Encounter (HOSPITAL_COMMUNITY): Payer: Self-pay | Admitting: Internal Medicine

## 2022-12-11 DIAGNOSIS — N250 Renal osteodystrophy: Secondary | ICD-10-CM | POA: Diagnosis present

## 2022-12-11 DIAGNOSIS — Y92009 Unspecified place in unspecified non-institutional (private) residence as the place of occurrence of the external cause: Secondary | ICD-10-CM

## 2022-12-11 DIAGNOSIS — Z96642 Presence of left artificial hip joint: Secondary | ICD-10-CM | POA: Diagnosis not present

## 2022-12-11 DIAGNOSIS — L03115 Cellulitis of right lower limb: Secondary | ICD-10-CM | POA: Diagnosis not present

## 2022-12-11 DIAGNOSIS — J90 Pleural effusion, not elsewhere classified: Secondary | ICD-10-CM | POA: Diagnosis not present

## 2022-12-11 DIAGNOSIS — R6889 Other general symptoms and signs: Secondary | ICD-10-CM | POA: Diagnosis not present

## 2022-12-11 DIAGNOSIS — I5043 Acute on chronic combined systolic (congestive) and diastolic (congestive) heart failure: Secondary | ICD-10-CM | POA: Diagnosis present

## 2022-12-11 DIAGNOSIS — J8410 Pulmonary fibrosis, unspecified: Secondary | ICD-10-CM | POA: Diagnosis not present

## 2022-12-11 DIAGNOSIS — I132 Hypertensive heart and chronic kidney disease with heart failure and with stage 5 chronic kidney disease, or end stage renal disease: Secondary | ICD-10-CM | POA: Diagnosis not present

## 2022-12-11 DIAGNOSIS — J9601 Acute respiratory failure with hypoxia: Secondary | ICD-10-CM | POA: Diagnosis not present

## 2022-12-11 DIAGNOSIS — R0602 Shortness of breath: Secondary | ICD-10-CM | POA: Diagnosis not present

## 2022-12-11 DIAGNOSIS — Z862 Personal history of diseases of the blood and blood-forming organs and certain disorders involving the immune mechanism: Secondary | ICD-10-CM

## 2022-12-11 DIAGNOSIS — Z1152 Encounter for screening for COVID-19: Secondary | ICD-10-CM | POA: Diagnosis not present

## 2022-12-11 DIAGNOSIS — T8241XA Breakdown (mechanical) of vascular dialysis catheter, initial encounter: Secondary | ICD-10-CM | POA: Diagnosis not present

## 2022-12-11 DIAGNOSIS — K219 Gastro-esophageal reflux disease without esophagitis: Secondary | ICD-10-CM | POA: Diagnosis present

## 2022-12-11 DIAGNOSIS — I953 Hypotension of hemodialysis: Secondary | ICD-10-CM | POA: Diagnosis not present

## 2022-12-11 DIAGNOSIS — Z91158 Patient's noncompliance with renal dialysis for other reason: Secondary | ICD-10-CM | POA: Diagnosis not present

## 2022-12-11 DIAGNOSIS — Z515 Encounter for palliative care: Secondary | ICD-10-CM

## 2022-12-11 DIAGNOSIS — G931 Anoxic brain damage, not elsewhere classified: Secondary | ICD-10-CM | POA: Diagnosis not present

## 2022-12-11 DIAGNOSIS — G9389 Other specified disorders of brain: Secondary | ICD-10-CM | POA: Diagnosis not present

## 2022-12-11 DIAGNOSIS — I10 Essential (primary) hypertension: Secondary | ICD-10-CM | POA: Diagnosis not present

## 2022-12-11 DIAGNOSIS — Z992 Dependence on renal dialysis: Secondary | ICD-10-CM | POA: Diagnosis not present

## 2022-12-11 DIAGNOSIS — R636 Underweight: Secondary | ICD-10-CM | POA: Diagnosis present

## 2022-12-11 DIAGNOSIS — E8779 Other fluid overload: Secondary | ICD-10-CM | POA: Diagnosis present

## 2022-12-11 DIAGNOSIS — D631 Anemia in chronic kidney disease: Secondary | ICD-10-CM | POA: Diagnosis present

## 2022-12-11 DIAGNOSIS — E781 Pure hyperglyceridemia: Secondary | ICD-10-CM | POA: Diagnosis not present

## 2022-12-11 DIAGNOSIS — Z66 Do not resuscitate: Secondary | ICD-10-CM | POA: Diagnosis present

## 2022-12-11 DIAGNOSIS — E8770 Fluid overload, unspecified: Secondary | ICD-10-CM | POA: Diagnosis not present

## 2022-12-11 DIAGNOSIS — I5042 Chronic combined systolic (congestive) and diastolic (congestive) heart failure: Secondary | ICD-10-CM | POA: Diagnosis not present

## 2022-12-11 DIAGNOSIS — Z681 Body mass index (BMI) 19 or less, adult: Secondary | ICD-10-CM

## 2022-12-11 DIAGNOSIS — Z043 Encounter for examination and observation following other accident: Secondary | ICD-10-CM | POA: Diagnosis not present

## 2022-12-11 DIAGNOSIS — R918 Other nonspecific abnormal finding of lung field: Secondary | ICD-10-CM | POA: Diagnosis not present

## 2022-12-11 DIAGNOSIS — G936 Cerebral edema: Secondary | ICD-10-CM | POA: Diagnosis not present

## 2022-12-11 DIAGNOSIS — Z7189 Other specified counseling: Secondary | ICD-10-CM | POA: Diagnosis not present

## 2022-12-11 DIAGNOSIS — Z79899 Other long term (current) drug therapy: Secondary | ICD-10-CM

## 2022-12-11 DIAGNOSIS — I120 Hypertensive chronic kidney disease with stage 5 chronic kidney disease or end stage renal disease: Secondary | ICD-10-CM | POA: Diagnosis not present

## 2022-12-11 DIAGNOSIS — Z743 Need for continuous supervision: Secondary | ICD-10-CM | POA: Diagnosis not present

## 2022-12-11 DIAGNOSIS — I447 Left bundle-branch block, unspecified: Secondary | ICD-10-CM | POA: Diagnosis not present

## 2022-12-11 DIAGNOSIS — I517 Cardiomegaly: Secondary | ICD-10-CM | POA: Diagnosis not present

## 2022-12-11 DIAGNOSIS — G319 Degenerative disease of nervous system, unspecified: Secondary | ICD-10-CM | POA: Diagnosis not present

## 2022-12-11 DIAGNOSIS — W19XXXA Unspecified fall, initial encounter: Secondary | ICD-10-CM | POA: Diagnosis present

## 2022-12-11 DIAGNOSIS — I6782 Cerebral ischemia: Secondary | ICD-10-CM | POA: Diagnosis not present

## 2022-12-11 DIAGNOSIS — R54 Age-related physical debility: Secondary | ICD-10-CM | POA: Diagnosis not present

## 2022-12-11 DIAGNOSIS — R0689 Other abnormalities of breathing: Secondary | ICD-10-CM | POA: Diagnosis not present

## 2022-12-11 DIAGNOSIS — I959 Hypotension, unspecified: Secondary | ICD-10-CM | POA: Diagnosis not present

## 2022-12-11 DIAGNOSIS — N189 Chronic kidney disease, unspecified: Secondary | ICD-10-CM

## 2022-12-11 DIAGNOSIS — N186 End stage renal disease: Secondary | ICD-10-CM | POA: Diagnosis present

## 2022-12-11 DIAGNOSIS — R531 Weakness: Secondary | ICD-10-CM | POA: Diagnosis not present

## 2022-12-11 DIAGNOSIS — R0600 Dyspnea, unspecified: Secondary | ICD-10-CM | POA: Diagnosis not present

## 2022-12-11 LAB — COMPREHENSIVE METABOLIC PANEL
ALT: 58 U/L — ABNORMAL HIGH (ref 0–44)
AST: 144 U/L — ABNORMAL HIGH (ref 15–41)
Albumin: 2.5 g/dL — ABNORMAL LOW (ref 3.5–5.0)
Alkaline Phosphatase: 124 U/L (ref 38–126)
Anion gap: 20 — ABNORMAL HIGH (ref 5–15)
BUN: 34 mg/dL — ABNORMAL HIGH (ref 8–23)
CO2: 21 mmol/L — ABNORMAL LOW (ref 22–32)
Calcium: 9.6 mg/dL (ref 8.9–10.3)
Chloride: 97 mmol/L — ABNORMAL LOW (ref 98–111)
Creatinine, Ser: 4.83 mg/dL — ABNORMAL HIGH (ref 0.61–1.24)
GFR, Estimated: 11 mL/min — ABNORMAL LOW (ref >60)
Glucose, Bld: 129 mg/dL — ABNORMAL HIGH (ref 70–99)
Potassium: 4.3 mmol/L (ref 3.5–5.1)
Sodium: 138 mmol/L (ref 135–145)
Total Bilirubin: 1.7 mg/dL — ABNORMAL HIGH (ref 0.3–1.2)
Total Protein: 5.5 g/dL — ABNORMAL LOW (ref 6.5–8.1)

## 2022-12-11 LAB — CBC WITH DIFFERENTIAL/PLATELET
Abs Immature Granulocytes: 0.08 10*3/uL — ABNORMAL HIGH (ref 0.00–0.07)
Basophils Absolute: 0 10*3/uL (ref 0.0–0.1)
Basophils Relative: 0 %
Eosinophils Absolute: 0 10*3/uL (ref 0.0–0.5)
Eosinophils Relative: 0 %
HCT: 42.5 % (ref 39.0–52.0)
Hemoglobin: 14.1 g/dL (ref 13.0–17.0)
Immature Granulocytes: 1 %
Lymphocytes Relative: 10 %
Lymphs Abs: 1.2 10*3/uL (ref 0.7–4.0)
MCH: 36 pg — ABNORMAL HIGH (ref 26.0–34.0)
MCHC: 33.2 g/dL (ref 30.0–36.0)
MCV: 108.4 fL — ABNORMAL HIGH (ref 80.0–100.0)
Monocytes Absolute: 0.9 10*3/uL (ref 0.1–1.0)
Monocytes Relative: 7 %
Neutro Abs: 9.9 10*3/uL — ABNORMAL HIGH (ref 1.7–7.7)
Neutrophils Relative %: 82 %
Platelets: 121 10*3/uL — ABNORMAL LOW (ref 150–400)
RBC: 3.92 MIL/uL — ABNORMAL LOW (ref 4.22–5.81)
RDW: 15.9 % — ABNORMAL HIGH (ref 11.5–15.5)
WBC: 12.2 10*3/uL — ABNORMAL HIGH (ref 4.0–10.5)
nRBC: 0.3 % — ABNORMAL HIGH (ref 0.0–0.2)

## 2022-12-11 LAB — TROPONIN I (HIGH SENSITIVITY): Troponin I (High Sensitivity): 145 ng/L (ref <18)

## 2022-12-11 LAB — SARS CORONAVIRUS 2 BY RT PCR: SARS Coronavirus 2 by RT PCR: NEGATIVE

## 2022-12-11 MED ORDER — CHLORHEXIDINE GLUCONATE CLOTH 2 % EX PADS
6.0000 | MEDICATED_PAD | Freq: Every day | CUTANEOUS | Status: DC
  Administered 2022-12-12: 6 via TOPICAL

## 2022-12-11 NOTE — Subjective & Objective (Signed)
CC: SOB HPI: 89 year old male history of end-stage renal disease on hemodialysis Monday Wednesday Friday via right-sided PermCath, hypertension, anemia chronic kidney disease, chronic systolic heart failure EF of 30%, chronic hypotension on midodrine, pulmonary fibrosis who presents to the ER today with shortness of breath and hypoxia.  Patient states that he felt bad yesterday and refused to go to dialysis.  He states that he "needed a break" from dialysis and thought that skipping dialysis would make him "feel better".  Obviously that did not work.  He was noted to be hypoxic when EMS arrived.  He was placed initially on nonrebreather.  This was weaned down to 5 L a minute.  Patient still lives at home with his geriatric wife.  On arrival temp 97.6 heart rate 81 blood pressure 119/64 satting 87% on 2 L.  O2 increased to 5 L per.  Sats now 95%.  White count 12.2, hemoglobin 14.1, platelets of 121  Sodium 138, potassium 4.3, bicarb 21, BUN of 34, creatinine 4.8, glucose 129, total protein 5.5, albumin 2.5, AST of 144, ALT 55, alk phos 124, total bili 1.7  COVID-negative  Chest x-ray shows diffuse pulmonary edema, right-sided dialysis catheter  CT head shows global general cerebral edema with chronic small vessel ischemic changes.  He also has chronic bilateral frontal parietal subdural hematomas unchanged from February 2024.  EDP is contacted nephrology.  No plans for dialysis tonight due to lack of available staff.  [facility] hospitalist consulted.

## 2022-12-11 NOTE — ED Notes (Signed)
ED TO INPATIENT HANDOFF REPORT  ED Nurse Name and Phone #:  Theadora Rama [phone]  S Name/Age/Gender Jeffrey Payne 89 y.o. male Room/Bed: 020C/020C  Code Status   Code Status: DNR  Home/SNF/Other Home Patient oriented to: self, place, time, and situation Is this baseline? Yes   Triage Complete: Triage complete  Chief Complaint Volume overload [E87.70]  Triage Note No notes on file   Allergies No Known Allergies  Level of Care/Admitting Diagnosis ED Disposition     ED Disposition  Admit   Condition  --   Comment  Hospital Area: [facility] [[id number]]  Level of Care: Med-Surg [16]  May place patient in observation at [facility] or [facility] if equivalent level of care is available:: No  Covid Evaluation: Asymptomatic - no recent exposure (last 10 days) testing not required  Diagnosis: Volume overload [629528]  Admitting Physician: Imogene Burn, ERIC [[id number]]  Attending Physician: Imogene Burn, ERIC [[id number]]          B Medical/Surgery History Past Medical History:  Diagnosis Date   Acute bronchitis 06/21/2022   Acute hypoxic respiratory failure (HCC) 06/21/2022   Acute metabolic encephalopathy 06/21/2022   Anemia    Arthritis    knees   Cancer (HCC)    prostate   Closed fracture of neck of left femur (HCC) 01/30/2021   ED (erectile dysfunction)    Elevated troponin 02/02/2022   GERD (gastroesophageal reflux disease)    ocassional    Hard of hearing    History of COVID-19 02/02/2022   Hypertension    Hypertriglyceridemia    Ileus, unspecified (HCC) 02/02/2021   Renal failure    Dialysis dependent   Scalp laceration 01/30/2021   Severe sepsis (HCC) 06/21/2022   Shortness of breath 06/21/2022   Past Surgical History:  Procedure Laterality Date   A/V FISTULAGRAM N/A 07/25/2020   Procedure: A/V FISTULAGRAM - Left Arm;  Surgeon: Sherren Kerns, MD;  Location: [facility]VASIVE [facility] LAB;  Service: Cardiovascular;  Laterality: N/A;   AV FISTULA PLACEMENT Left  03/04/2020   Procedure: BRACHIOCEPHALIC ARTERIOVENOUS (AV) FISTULA CREATION LEFT;  Surgeon: Sherren Kerns, MD;  Location: [facility] OR;  Service: Vascular;  Laterality: Left;   AV FISTULA PLACEMENT Right 07/28/2020   Procedure: RIGHT  ARM ARTERIOVENOUS FISTULA CREATION;  Surgeon: Sherren Kerns, MD;  Location: [facility] OR;  Service: Vascular;  Laterality: Right;   COLONOSCOPY     HIP ARTHROPLASTY Left 01/30/2021   Procedure: ARTHROPLASTY BIPOLAR HIP (HEMIARTHROPLASTY);  Surgeon: Huel Cote, MD;  Location: [facility] OR;  Service: Orthopedics;  Laterality: Left;   IR AV DIALY SHUNT INTRO NEEDLE/INTRACATH INITIAL W/PTA/IMG RIGHT Right 02/09/2021   IR FLUORO GUIDE CV LINE RIGHT  02/11/2021   IR US GUIDE VASC ACCESS RIGHT  02/09/2021   IR US GUIDE VASC ACCESS RIGHT  02/11/2021   renal cyst biopsy     TONSILLECTOMY       A IV Location/Drains/Wounds Patient Lines/Drains/Airways Status     Active Line/Drains/Airways     Name Placement date Placement time Site Days   Peripheral IV 12/11/22 Anterior;Distal;Right;Upper Arm 12/11/22  2058  Arm  less than 1   Fistula / Graft Right Forearm Arteriovenous fistula --  --  Forearm  --            Intake/Output Last 24 hours No intake or output data in the 24 hours ending 12/11/22 2319  Labs/Imaging Results for orders placed or performed during the hospital encounter of 12/11/22 (from the past  48 hour(s))  CBC with Differential     Status: Abnormal   Collection Time: 12/11/22  9:11 PM  Result Value Ref Range   WBC 12.2 (H) 4.0 - 10.5 K/uL   RBC 3.92 (L) 4.22 - 5.81 MIL/uL   Hemoglobin 14.1 13.0 - 17.0 g/dL   HCT 16.1 09.6 - 04.5 %   MCV 108.4 (H) 80.0 - 100.0 fL   MCH 36.0 (H) 26.0 - 34.0 pg   MCHC 33.2 30.0 - 36.0 g/dL   RDW 40.9 (H) 81.1 - 91.4 %   Platelets 121 (L) 150 - 400 K/uL   nRBC 0.3 (H) 0.0 - 0.2 %   Neutrophils Relative % 82 %   Neutro Abs 9.9 (H) 1.7 - 7.7 K/uL   Lymphocytes Relative 10 %   Lymphs Abs 1.2 0.7 - 4.0 K/uL   Monocytes  Relative 7 %   Monocytes Absolute 0.9 0.1 - 1.0 K/uL   Eosinophils Relative 0 %   Eosinophils Absolute 0.0 0.0 - 0.5 K/uL   Basophils Relative 0 %   Basophils Absolute 0.0 0.0 - 0.1 K/uL   Immature Granulocytes 1 %   Abs Immature Granulocytes 0.08 (H) 0.00 - 0.07 K/uL    Comment: Performed at [facility], [address], [place], [place] [address]  Comprehensive metabolic panel     Status: Abnormal   Collection Time: 12/11/22  9:11 PM  Result Value Ref Range   Sodium 138 135 - 145 mmol/L   Potassium 4.3 3.5 - 5.1 mmol/L   Chloride 97 (L) 98 - 111 mmol/L   CO2 21 (L) 22 - 32 mmol/L   Glucose, Bld 129 (H) 70 - 99 mg/dL    Comment: Glucose reference range applies only to samples taken after fasting for at least 8 hours.   BUN 34 (H) 8 - 23 mg/dL   Creatinine, Ser 6.21 (H) 0.61 - 1.24 mg/dL   Calcium 9.6 8.9 - 30.8 mg/dL   Total Protein 5.5 (L) 6.5 - 8.1 g/dL   Albumin 2.5 (L) 3.5 - 5.0 g/dL   AST 657 (H) 15 - 41 U/L   ALT 58 (H) 0 - 44 U/L   Alkaline Phosphatase 124 38 - 126 U/L   Total Bilirubin 1.7 (H) 0.3 - 1.2 mg/dL   GFR, Estimated 11 (L) >60 mL/min    Comment: (NOTE) Calculated using the CKD-EPI Creatinine Equation (2021)    Anion gap 20 (H) 5 - 15    Comment: Performed at [facility], [address] [place], [place] [address]  Troponin I (High Sensitivity)     Status: Abnormal   Collection Time: 12/11/22  9:11 PM  Result Value Ref Range   Troponin I (High Sensitivity) 145 (HH) <18 ng/L    Comment: CRITICAL RESULT CALLED TO, READ BACK BY AND VERIFIED WITH Shalah Estelle,P. RN @2154  12/11/22 SATRAINR (NOTE) Elevated high sensitivity troponin I (hsTnI) values and significant  changes across serial measurements may suggest ACS but many other  chronic and acute conditions are known to elevate hsTnI results.  Refer to the "Links" section for chest pain algorithms and additional  guidance. Performed at [facility], [address], [place], [place] [address]    SARS Coronavirus 2 by RT PCR (hospital order, performed in [facility] hospital lab) *cepheid single result test* Anterior Nasal Swab     Status: None   Collection Time: 12/11/22  9:11 PM   Specimen: Anterior Nasal Swab  Result Value Ref  Range   SARS Coronavirus 2 by RT PCR NEGATIVE NEGATIVE    Comment: Performed at [facility], [address] [place], [place] [address]   CT HEAD WO CONTRAST ( )  Result Date: 12/11/2022 CLINICAL DATA:  Status post fall. EXAM: CT HEAD WITHOUT CONTRAST TECHNIQUE: Contiguous axial images were obtained from the base of the skull through the vertex without intravenous contrast. RADIATION DOSE REDUCTION: This exam was performed according to the departmental dose-optimization program which includes automated exposure control, adjustment of the mA and/or kV according to patient size and/or use of iterative reconstruction technique. COMPARISON:  June 21, 2022 FINDINGS: Brain: There is moderate severity cerebral atrophy with widening of the extra-axial spaces and ventricular dilatation. There are areas of decreased attenuation within the white matter tracts of the supratentorial brain, consistent with microvascular disease changes. Bilateral chronic frontoparietal subdural hematomas are seen. There is no evidence of associated mass effect. This represents a new finding when compared to the prior study. Stable areas of coarse calcification are seen within the region of the central pons. Vascular: There is moderate to marked severity bilateral cavernous carotid artery calcification. Skull: Normal. Negative for fracture or focal lesion. Sinuses/Orbits: No acute finding. Other: None. IMPRESSION: 1. Generalized cerebral atrophy with chronic white matter small vessel ischemic changes. 2. Chronic, bilateral frontoparietal subdural hematomas. 3. No acute intracranial abnormality. Electronically Signed   By: Aram Candela M.D.   On: 12/11/2022 21:50   DG Chest Portable 1  View  Result Date: 12/11/2022 CLINICAL DATA:  Shortness of breath EXAM: PORTABLE CHEST 1 VIEW COMPARISON:  06/21/2022 FINDINGS: Gross cardiomegaly. Large-bore right neck multi lumen vascular catheter. Diffuse bilateral interstitial pulmonary opacity. Small right pleural effusion. No focal airspace opacity. Osseous structures unremarkable. IMPRESSION: Gross cardiomegaly with diffuse bilateral interstitial pulmonary opacity, consistent with edema. Small right pleural effusion. No focal airspace opacity. Electronically Signed   By: Jearld Lesch M.D.   On: 12/11/2022 21:17    Pending Labs Unresulted Labs (From admission, onward)    None       Vitals/Pain Today's Vitals   12/11/22 2036 12/11/22 2042 12/11/22 2100 12/11/22 2300  BP:   111/70 (!) 164/75  Pulse: 70 69 81 85  Resp: (!) 22 14 16 15   Temp:    97.8 F (36.6 C)  TempSrc:    Oral  SpO2: 90% 94% 91% 95%  Weight:      Height:      PainSc:        Isolation Precautions Airborne and Contact precautions  Medications Medications - No data to display  Mobility Walks and fell today     Focused Assessments Cardiac Assessment Handoff:  Cardiac Rhythm: Normal sinus rhythm, Bundle branch block Lab Results  Component Value Date   CKTOTAL 3,637 (H) 01/20/2022   No results found for: "DDIMER" Does the Patient currently have chest pain?  no  , Neuro Assessment Handoff:  Swallow screen pass? Yes  Cardiac Rhythm: Normal sinus rhythm, Bundle branch block       Neuro Assessment:   Neuro Checks:      Has TPA been given? No If patient is a Neuro Trauma and patient is going to OR before floor call report to 4N Charge nurse: [phone] or [phone]  , Pulmonary Assessment Handoff:  Lung sounds: Bilateral Breath Sounds: Diminished O2 Device: Nasal Cannula O2 Flow Rate (L/min): 5 L/min    R Recommendations: See Admitting Provider Note  Report given to:   Additional Notes:

## 2022-12-11 NOTE — ED Provider Notes (Addendum)
[place] EMERGENCY DEPARTMENT AT [facility] Provider Note   CSN: [id number] Arrival date & time: 12/11/22  2031     History Chief Complaint  Patient presents with   Fall    Patient to ED via EMS with complaint of witnessed fall. EMS reports patient skipped dialysis today due to weakness. Patient arrives on NRB. EMS reports using NRB because patient was cyanotic and they could not get a signal from pulse ox.      HPI Jeffrey Payne is a 89 y.o. male presenting for chief complaint shortness of breath.  States that he missed dialysis yesterday. He lives at a skilled nursing facility, he was found to be cyanotic at the facility with saturations in the 50%.  Started on nonrebreather brought for further care and management.  On arrival he has substantial edema endorses shortness of breath.  Otherwise patient struggles to answer questions..   Patient's recorded medical, surgical, social, medication list and allergies were reviewed in the Snapshot window as part of the initial history.   Review of Systems   Review of Systems  Constitutional:  Negative for chills and fever.  HENT:  Negative for ear pain and sore throat.   Eyes:  Negative for pain and visual disturbance.  Respiratory:  Positive for shortness of breath. Negative for cough.   Cardiovascular:  Negative for chest pain and palpitations.  Gastrointestinal:  Negative for abdominal pain and vomiting.  Genitourinary:  Negative for dysuria and hematuria.  Musculoskeletal:  Negative for arthralgias and back pain.  Skin:  Negative for color change and rash.  Neurological:  Negative for seizures and syncope.  All other systems reviewed and are negative.   Physical Exam Updated Vital Signs BP 111/70   Pulse 81   Temp 97.6 F (36.4 C) (Oral)   Resp 16   Ht 5\' 7"  (1.702 m)   Wt 50 kg   SpO2 91%   BMI 17.26 kg/m  Physical Exam Vitals and nursing note reviewed.  Constitutional:      General: He is not in acute  distress.    Appearance: He is well-developed.  HENT:     Head: Normocephalic and atraumatic.  Eyes:     Conjunctiva/sclera: Conjunctivae normal.  Cardiovascular:     Rate and Rhythm: Normal rate and regular rhythm.     Heart sounds: No murmur heard. Pulmonary:     Effort: Pulmonary effort is normal. No respiratory distress.     Breath sounds: Normal breath sounds.  Abdominal:     Palpations: Abdomen is soft.     Tenderness: There is no abdominal tenderness.  Musculoskeletal:        General: No swelling.     Cervical back: Neck supple.  Skin:    General: Skin is warm and dry.     Capillary Refill: Capillary refill takes less than 2 seconds.  Neurological:     Mental Status: He is alert.  Psychiatric:        Mood and Affect: Mood normal.      ED Course/ Medical Decision Making/ A&P    Procedures .Critical Care  Performed by: Glyn Ade, MD Authorized by: Glyn Ade, MD   Critical care provider statement:    Critical care time (minutes):  85   Critical care was necessary to treat or prevent imminent or life-threatening deterioration of the following conditions:  Circulatory failure and renal failure   Critical care was time spent personally by me on the following activities:  Development  of treatment plan with patient or surrogate, discussions with consultants, evaluation of patient's response to treatment, examination of patient, ordering and review of laboratory studies, ordering and performing treatments and interventions, pulse oximetry, re-evaluation of patient's condition and review of old charts   Care discussed with: admitting provider      Medications Ordered in ED Medications - No data to display  Medical Decision Making:    [name] is a 89 y.o. male who presented to the ED today with chief complaint shortness of breath detailed above.      Complete initial physical exam performed, notably the patient  was requiring 6 L to maintain  oxygen saturations.      Reviewed and confirmed nursing documentation for past medical history, family history, social history.    Initial Assessment:   With the patient's presentation of shortness of breath, most likely diagnosis is heart failure exacerbation likely secondary to missed dialysis. Other diagnoses were considered including (but not limited to) ACS, pulmonary embolism, pneumonia, pneumothorax. These are considered less likely due to history of present illness and physical exam findings.   This is most consistent with an acute life/limb threatening illness complicated by underlying chronic conditions.  Initial Plan:  Consultation with neurology Screening labs including CBC and Metabolic panel to evaluate for infectious or metabolic etiology of disease.  CXR to evaluate for structural/infectious intrathoracic pathology.  EKG to evaluate for cardiac pathology. Objective evaluation as below reviewed with plan for close reassessment  Initial Study Results:   Laboratory  All laboratory results reviewed without evidence of clinically relevant pathology.    EKG EKG was reviewed independently. Rate, rhythm, axis, intervals all examined and without medically relevant abnormality. ST segments without concerns for elevations.    Radiology  All images reviewed independently. Agree with radiology report at this time.   CT HEAD WO CONTRAST ( )  Result Date: 12/11/2022 CLINICAL DATA:  Status post fall. EXAM: CT HEAD WITHOUT CONTRAST TECHNIQUE: Contiguous axial images were obtained from the base of the skull through the vertex without intravenous contrast. RADIATION DOSE REDUCTION: This exam was performed according to the departmental dose-optimization program which includes automated exposure control, adjustment of the mA and/or kV according to patient size and/or use of iterative reconstruction technique. COMPARISON:  June 21, 2022 FINDINGS: Brain: There is moderate severity cerebral  atrophy with widening of the extra-axial spaces and ventricular dilatation. There are areas of decreased attenuation within the white matter tracts of the supratentorial brain, consistent with microvascular disease changes. Bilateral chronic frontoparietal subdural hematomas are seen. There is no evidence of associated mass effect. This represents a new finding when compared to the prior study. Stable areas of coarse calcification are seen within the region of the central pons. Vascular: There is moderate to marked severity bilateral cavernous carotid artery calcification. Skull: Normal. Negative for fracture or focal lesion. Sinuses/Orbits: No acute finding. Other: None. IMPRESSION: 1. Generalized cerebral atrophy with chronic white matter small vessel ischemic changes. 2. Chronic, bilateral frontoparietal subdural hematomas. 3. No acute intracranial abnormality. Electronically Signed   By: Aram Candela M.D.   On: 12/11/2022 21:50   DG Chest Portable 1 View  Result Date: 12/11/2022 CLINICAL DATA:  Shortness of breath EXAM: PORTABLE CHEST 1 VIEW COMPARISON:  06/21/2022 FINDINGS: Gross cardiomegaly. Large-bore right neck multi lumen vascular catheter. Diffuse bilateral interstitial pulmonary opacity. Small right pleural effusion. No focal airspace opacity. Osseous structures unremarkable. IMPRESSION: Gross cardiomegaly with diffuse bilateral interstitial pulmonary opacity, consistent with edema. Small right pleural  effusion. No focal airspace opacity. Electronically Signed   By: Jearld Lesch M.D.   On: 12/11/2022 21:17     Reassessment and Plan:   Consulted Dr. Chales Abrahams of nephrology.  He stated that there is unlikely to be dialysis availability tonight, recommended admission to hospitalist on oxygen overnight with To dialyze tomorrow and reassess.   Disposition:   Based on the above findings, I believe this patient is stable for admission.    Patient/family educated about specific findings on our  evaluation and explained exact reasons for admission.  Patient/family educated about clinical situation and time was allowed to answer questions.   Admission team communicated with and agreed with need for admission. Patient admitted. Patient  ready to move at this time.     Emergency Department Medication Summary:   Medications - No data to display   Clinical Impression:  1. SOB (shortness of breath)      Data Unavailable   Final Clinical Impression(s) / ED Diagnoses Final diagnoses:  SOB (shortness of breath)    Rx / DC Orders ED Discharge Orders     None         Glyn Ade, MD 12/11/22 2254    Glyn Ade, MD 12/11/22 2336

## 2022-12-11 NOTE — Assessment & Plan Note (Signed)
Continue with midodrine tid.

## 2022-12-11 NOTE — Assessment & Plan Note (Signed)
Currently on 5 L/min. No record of pt needing long term home O2. Will attempt to wean off O2 after HD session.

## 2022-12-11 NOTE — ED Notes (Signed)
Daughter Wynona Canes [phone] would like an update asap

## 2022-12-11 NOTE — ED Notes (Signed)
Patient presented to ED today for: Chief Complaint of Fall (Patient to ED via EMS with complaint of witnessed fall. EMS reports patient skipped dialysis today due to weakness. Patient arrives on NRB. EMS reports using NRB because patient was cyanotic and they could not get a signal from pulse ox. )

## 2022-12-11 NOTE — Assessment & Plan Note (Addendum)
Verified DNR with pt's dtr Rolly Salter

## 2022-12-11 NOTE — Consult Note (Signed)
Renal Service Consult Note [facility]  Jeffrey Payne 12/11/2022 Maree Krabbe, MD Requesting Physician: Dr. Doran Durand  Reason for Consult: ESRD pt sp fall at home, gen'd weakness HPI: The patient is a 89 y.o. year-old w/ PMH as below who presented to ED this evening after c/o a witnessed fall. Pt skipped HD today due to gen'd weakness, last HD was 8/15.  Pt was brought from his facility to ED by EMS wearing NRB mask (EMS could not get a signal from pulse ox and pt looked cyanotic per EMS).  In ED pt endorsed SOB and lots of LE edema. O/w pt was poor historian. In ED BP 111/70, HR 81, RR 16, temp 97. Na 138  K 4.3  CO2 21  BUN 34   creat 4.83, alb 2.5, AST 144, ALT 58, tbili 1.7, trop 145, WBC 12, Hb 14. CXR showed bilat gross pulm edema. Pt is to be admitted. We are asked to see for dialysis.   Pt seen in ED. Poor historian in general, "I just want to sleep". Is on 5 L [place] in ED.  Denies any N/V or confusion.    ROS - n/a   Past Medical History  Past Medical History:  Diagnosis Date   Anemia    Arthritis    knees   Cancer (HCC)    prostate   ED (erectile dysfunction)    GERD (gastroesophageal reflux disease)    ocassional    Hard of hearing    Hypertension    Hypertriglyceridemia    Renal failure    Dialysis dependent   Past Surgical History  Past Surgical History:  Procedure Laterality Date   A/V FISTULAGRAM N/A 07/25/2020   Procedure: A/V FISTULAGRAM - Left Arm;  Surgeon: Sherren Kerns, MD;  Location: [facility] INVASIVE CV LAB;  Service: Cardiovascular;  Laterality: N/A;   AV FISTULA PLACEMENT Left 03/04/2020   Procedure: BRACHIOCEPHALIC ARTERIOVENOUS (AV) FISTULA CREATION LEFT;  Surgeon: Sherren Kerns, MD;  Location: [facility] OR;  Service: Vascular;  Laterality: Left;   AV FISTULA PLACEMENT Right 07/28/2020   Procedure: RIGHT  ARM ARTERIOVENOUS FISTULA CREATION;  Surgeon: Sherren Kerns, MD;  Location: [facility] OR;  Service: Vascular;  Laterality: Right;    COLONOSCOPY     HIP ARTHROPLASTY Left 01/30/2021   Procedure: ARTHROPLASTY BIPOLAR HIP (HEMIARTHROPLASTY);  Surgeon: Huel Cote, MD;  Location: [facility] OR;  Service: Orthopedics;  Laterality: Left;   IR AV DIALY SHUNT INTRO NEEDLE/INTRACATH INITIAL W/PTA/IMG RIGHT Right 02/09/2021   IR FLUORO GUIDE CV LINE RIGHT  02/11/2021   IR US GUIDE VASC ACCESS RIGHT  02/09/2021   IR US GUIDE VASC ACCESS RIGHT  02/11/2021   renal cyst biopsy     TONSILLECTOMY     Family History  Family History  Problem Relation Age of Onset   Cancer Mother    Social History  reports that he has never smoked. He has never used smokeless tobacco. He reports current alcohol use of about 14.0 standard drinks of alcohol per week. He reports that he does not use drugs. Allergies No Known Allergies Home medications Prior to Admission medications   Medication Sig Start Date End Date Taking? Authorizing Provider  acetaminophen (TYLENOL) 650 MG CR tablet Take 650 mg by mouth every 8 (eight) hours as needed for pain.    [provider]  calcium acetate (PHOSLO) 667 MG tablet Take 667 mg by mouth 3 (three) times daily with meals. Patient not taking: Reported on 06/14/2022 12/16/21  [provider]  cetirizine (ZYRTEC) 10 MG tablet Take 10 mg by mouth daily.    [provider]  ipratropium-albuterol (DUONEB) 0.5-2.5 (3) MG/3ML SOLN Take 3 mLs by nebulization every 6 (six) hours as needed. 06/29/22   Ghimire, Werner Lean, MD  melatonin 3 MG TABS tablet Take 1 tablet (3 mg total) by mouth at bedtime as needed (insomnia). 06/29/22   Ghimire, Werner Lean, MD  metoprolol tartrate (LOPRESSOR) 25 MG tablet Take 0.5 tablets (12.5 mg total) by mouth 3 (three) times daily. 02/10/21   Rhetta Mura, MD  midodrine (PROAMATINE) 10 MG tablet Take 1 tablet (10 mg total) by mouth 3 (three) times daily with meals. 06/29/22   Ghimire, Werner Lean, MD  omeprazole (PRILOSEC OTC) 20 MG tablet Take 20 mg by mouth daily.     [provider]  tamsulosin (FLOMAX) 0.4 MG CAPS capsule Take 1 capsule (0.4 mg total) by mouth daily after supper. 10/09/19   Tyrone Nine, MD     Vitals:   12/11/22 2035 12/11/22 2036 12/11/22 2042 12/11/22 2100  BP:    111/70  Pulse:  70 69 81  Resp:  (!) 22 14 16   Temp:      TempSrc:      SpO2:  90% 94% 91%  Weight: 50 kg     Height: 5\' 7"  (1.702 m)      Exam Gen elderly frail WM, arouses and engages in simple conversation, then falls back asleep No rash, cyanosis or gangrene Sclera anicteric, throat clear  No jvd or bruits Chest bilat basilar rales 1/3 up, no bronch BS RRR no RG Abd soft ntnd no mass or ascites +bs GU normal male MS no joint effusions or deformity Ext diffuse 2+ pitting bilat pretib edema, no wounds or ulcers Neuro is a bit lethargic, arousable, nonfocal, sig deconditioning    RIJ TDC in place      Home meds include - phoslo 1 ac tid, duneb, metoprolol 12.5 bid, midodrine 10 mg tid, omeprazole, tamsulosin, prns     OP HD: [facility] TTS  3h  400/1.5   50.2kg   2/2 bath  RIJ TDC/ AVF   Heparin 4000 - consistently coming off 3- 4kg over for the last 2 wks - last OP HD 8/15, post wt 53.6kg - rocaltrol 0.5 mcg po tiw   Assessment/ Plan: Acute hypoxic resp failure - w/ volume overload / edema on exam, and pulm edema by CXR. Will need serial HD most likely, probably has lost a lot of body wt as well. Unable to dialyze tonight due to other emergencies. Plan HD 1st shift tomorrow am. Stable on 5 L [place] O2. Have d/w patient.  ESRD - on HD TTS. Missed HD today, last OP HD was 8/15. HD as above.  HTN - hold metoprolol for SBP < 120 please. May need midodrine as at home if BP's remain soft.  Anemia esrd - Hb 14, no esa needs.  MBD ckd - CCa slightly high at 10.8, will hold rocaltrol for now. Check phos, cont phoslo as binder.  Deconditioning - severely deconditioned. Lives in facility.       Vinson Moselle  MD [facility] 12/11/2022, 11:09 PM  Recent Labs  Lab  12/11/22 2111  HGB 14.1  ALBUMIN 2.5*  CALCIUM 9.6  CREATININE 4.83*  K 4.3   Inpatient medications:

## 2022-12-11 NOTE — Assessment & Plan Note (Signed)
Hx of hypertension. Dtr(Jeffrey Payne) states that pt had some low BP during HD on Wednesday. Will hold lopressor and continue midodrine.

## 2022-12-11 NOTE — Assessment & Plan Note (Addendum)
Observation med/surg bed. Due to pt intentionally skipping HD yesterday. EDP has contact nephrology for HD tomorrow.

## 2022-12-11 NOTE — H&P (Signed)
History and Physical    Jeffrey Payne WRU:[id number] DOB: 10/10/1932 DOA: 12/11/2022  DOS: the patient was seen and examined on 12/11/2022  PCP: Orpha Bur, MD   Patient coming from: Home  I have personally briefly reviewed patient's old medical records in [facility]  CC: SOB HPI: 89 year old male history of end-stage renal disease on hemodialysis Monday Wednesday Friday via right-sided PermCath, hypertension, anemia chronic kidney disease, chronic systolic heart failure EF of 30%, chronic hypotension on midodrine, pulmonary fibrosis who presents to the ER today with shortness of breath and hypoxia.  Patient states that he felt bad yesterday and refused to go to dialysis.  He states that he "needed a break" from dialysis and thought that skipping dialysis would make him "feel better".  Obviously that did not work.  He was noted to be hypoxic when EMS arrived.  He was placed initially on nonrebreather.  This was weaned down to 5 L a minute.  Patient still lives at home with his geriatric wife.  On arrival temp 97.6 heart rate 81 blood pressure 119/64 satting 87% on 2 L.  O2 increased to 5 L per.  Sats now 95%.  White count 12.2, hemoglobin 14.1, platelets of 121  Sodium 138, potassium 4.3, bicarb 21, BUN of 34, creatinine 4.8, glucose 129, total protein 5.5, albumin 2.5, AST of 144, ALT 55, alk phos 124, total bili 1.7  COVID-negative  Chest x-ray shows diffuse pulmonary edema, right-sided dialysis catheter  CT head shows global general cerebral edema with chronic small vessel ischemic changes.  He also has chronic bilateral frontal parietal subdural hematomas unchanged from February 2024.  EDP is contacted nephrology.  No plans for dialysis tonight due to lack of available staff.  [facility] consulted.   ED Course: hypoxic on 2 L..increased to 5 L/min. CXR shows widespread pulmonary edema.  Review of Systems:  Review of Systems  Constitutional:  Positive for  malaise/fatigue.  HENT: Negative.    Eyes: Negative.   Respiratory:  Positive for shortness of breath.   Cardiovascular:  Positive for leg swelling.  Gastrointestinal: Negative.   Genitourinary: Negative.   Musculoskeletal: Negative.   Skin: Negative.   Neurological:  Positive for weakness.  Endo/Heme/Allergies: Negative.   Psychiatric/Behavioral: Negative.    All other systems reviewed and are negative.   Past Medical History:  Diagnosis Date   Acute bronchitis 06/21/2022   Acute hypoxic respiratory failure (HCC) 06/21/2022   Acute metabolic encephalopathy 06/21/2022   Anemia    Arthritis    knees   Cancer (HCC)    prostate   Closed fracture of neck of left femur (HCC) 01/30/2021   ED (erectile dysfunction)    Elevated troponin 02/02/2022   Fall at home, initial encounter 01/29/2021   GERD (gastroesophageal reflux disease)    ocassional    Hard of hearing    History of COVID-19 02/02/2022   Hypertension    Hypertriglyceridemia    Ileus, unspecified (HCC) 02/02/2021   Renal failure    Dialysis dependent   Scalp laceration 01/30/2021   Severe sepsis (HCC) 06/21/2022   Shortness of breath 06/21/2022    Past Surgical History:  Procedure Laterality Date   A/V FISTULAGRAM N/A 07/25/2020   Procedure: A/V FISTULAGRAM - Left Arm;  Surgeon: Sherren Kerns, MD;  Location: [facility] INVASIVE CV LAB;  Service: Cardiovascular;  Laterality: N/A;   AV FISTULA PLACEMENT Left 03/04/2020   Procedure: BRACHIOCEPHALIC ARTERIOVENOUS (AV) FISTULA CREATION LEFT;  Surgeon: Sherren Kerns, MD;  Location: [facility] OR;  Service: Vascular;  Laterality: Left;   AV FISTULA PLACEMENT Right 07/28/2020   Procedure: RIGHT  ARM ARTERIOVENOUS FISTULA CREATION;  Surgeon: Sherren Kerns, MD;  Location: [facility] OR;  Service: Vascular;  Laterality: Right;   COLONOSCOPY     HIP ARTHROPLASTY Left 01/30/2021   Procedure: ARTHROPLASTY BIPOLAR HIP (HEMIARTHROPLASTY);  Surgeon: Huel Cote, MD;  Location: [facility] OR;   Service: Orthopedics;  Laterality: Left;   IR AV DIALY SHUNT INTRO NEEDLE/INTRACATH INITIAL W/PTA/IMG RIGHT Right 02/09/2021   IR FLUORO GUIDE CV LINE RIGHT  02/11/2021   IR US GUIDE VASC ACCESS RIGHT  02/09/2021   IR US GUIDE VASC ACCESS RIGHT  02/11/2021   renal cyst biopsy     TONSILLECTOMY       reports that he has never smoked. He has never used smokeless tobacco. He reports current alcohol use of about 14.0 standard drinks of alcohol per week. He reports that he does not use drugs.  No Known Allergies  Family History  Problem Relation Age of Onset   Cancer Mother     Prior to Admission medications   Medication Sig Start Date End Date Taking? Authorizing Provider  acetaminophen (TYLENOL) 650 MG CR tablet Take 650 mg by mouth every 8 (eight) hours as needed for pain.    [provider]  calcium acetate (PHOSLO) 667 MG tablet Take 667 mg by mouth 3 (three) times daily with meals. Patient not taking: Reported on 06/14/2022 12/16/21   [provider]  cetirizine (ZYRTEC) 10 MG tablet Take 10 mg by mouth daily.    [provider]  ipratropium-albuterol (DUONEB) 0.5-2.5 (3) MG/3ML SOLN Take 3 mLs by nebulization every 6 (six) hours as needed. 06/29/22   Ghimire, Werner Lean, MD  melatonin 3 MG TABS tablet Take 1 tablet (3 mg total) by mouth at bedtime as needed (insomnia). 06/29/22   Ghimire, Werner Lean, MD  metoprolol tartrate (LOPRESSOR) 25 MG tablet Take 0.5 tablets (12.5 mg total) by mouth 3 (three) times daily. 02/10/21   Rhetta Mura, MD  midodrine (PROAMATINE) 10 MG tablet Take 1 tablet (10 mg total) by mouth 3 (three) times daily with meals. 06/29/22   Ghimire, Werner Lean, MD  omeprazole (PRILOSEC OTC) 20 MG tablet Take 20 mg by mouth daily.    [provider]  tamsulosin (FLOMAX) 0.4 MG CAPS capsule Take 1 capsule (0.4 mg total) by mouth daily after supper. 10/09/19   Tyrone Nine, MD    Physical Exam: Vitals:   12/11/22 2036 12/11/22 2042  12/11/22 2100 12/11/22 2300  BP:   111/70 (!) 164/75  Pulse: 70 69 81 85  Resp: (!) 22 14 16 15   Temp:    97.8 F (36.6 C)  TempSrc:    Oral  SpO2: 90% 94% 91% 95%  Weight:      Height:        Physical Exam Vitals and nursing note reviewed.  Constitutional:      Appearance: He is ill-appearing.     Comments: Chronically ill and frail male  HENT:     Head: Normocephalic and atraumatic.     Nose: Nose normal.  Cardiovascular:     Rate and Rhythm: Normal rate and regular rhythm.  Pulmonary:     Breath sounds: Examination of the right-upper field reveals rales. Examination of the left-upper field reveals rales. Examination of the right-middle field reveals rales. Examination of the left-middle field reveals rales. Examination of the right-lower field reveals rales.  Examination of the left-lower field reveals rales. Rales present.  Abdominal:     General: Bowel sounds are normal. There is no distension.     Palpations: Abdomen is soft.     Tenderness: There is no abdominal tenderness.  Musculoskeletal:     Right lower leg: 2+ Pitting Edema present.     Left lower leg: 2+ Pitting Edema present.     Right ankle: Swelling present.     Left ankle: Swelling present.     Right foot: Swelling present.     Left foot: Swelling present.  Skin:    General: Skin is warm and dry.     Capillary Refill: Capillary refill takes less than 2 seconds.  Neurological:     Mental Status: He is disoriented.     Comments: Not oriented to time or place      Labs on Admission: I have personally reviewed following labs and imaging studies  CBC: Recent Labs  Lab 12/11/22 2111  WBC 12.2*  NEUTROABS 9.9*  HGB 14.1  HCT 42.5  MCV 108.4*  PLT 121*   Basic Metabolic Panel: Recent Labs  Lab 12/11/22 2111  NA 138  K 4.3  CL 97*  CO2 21*  GLUCOSE 129*  BUN 34*  CREATININE 4.83*  CALCIUM 9.6   GFR: Estimated Creatinine Clearance: 7.6 mL/min (A) (by C-G formula based on SCr of 4.83 mg/dL  (H)). Liver Function Tests: Recent Labs  Lab 12/11/22 2111  AST 144*  ALT 58*  ALKPHOS 124  BILITOT 1.7*  PROT 5.5*  ALBUMIN 2.5*   Cardiac Enzymes: Recent Labs  Lab 12/11/22 2111  TROPONINIHS 145*   BNP (last 3 results) Recent Labs    02/01/22 2151 06/21/22 1215  BNP 4,356.5* >4,500.0*   Radiological Exams on Admission: I have personally reviewed images CT HEAD WO CONTRAST ( )  Result Date: 12/11/2022 CLINICAL DATA:  Status post fall. EXAM: CT HEAD WITHOUT CONTRAST TECHNIQUE: Contiguous axial images were obtained from the base of the skull through the vertex without intravenous contrast. RADIATION DOSE REDUCTION: This exam was performed according to the departmental dose-optimization program which includes automated exposure control, adjustment of the mA and/or kV according to patient size and/or use of iterative reconstruction technique. COMPARISON:  June 21, 2022 FINDINGS: Brain: There is moderate severity cerebral atrophy with widening of the extra-axial spaces and ventricular dilatation. There are areas of decreased attenuation within the white matter tracts of the supratentorial brain, consistent with microvascular disease changes. Bilateral chronic frontoparietal subdural hematomas are seen. There is no evidence of associated mass effect. This represents a new finding when compared to the prior study. Stable areas of coarse calcification are seen within the region of the central pons. Vascular: There is moderate to marked severity bilateral cavernous carotid artery calcification. Skull: Normal. Negative for fracture or focal lesion. Sinuses/Orbits: No acute finding. Other: None. IMPRESSION: 1. Generalized cerebral atrophy with chronic white matter small vessel ischemic changes. 2. Chronic, bilateral frontoparietal subdural hematomas. 3. No acute intracranial abnormality. Electronically Signed   By: Aram Candela M.D.   On: 12/11/2022 21:50   DG Chest Portable 1  View  Result Date: 12/11/2022 CLINICAL DATA:  Shortness of breath EXAM: PORTABLE CHEST 1 VIEW COMPARISON:  06/21/2022 FINDINGS: Gross cardiomegaly. Large-bore right neck multi lumen vascular catheter. Diffuse bilateral interstitial pulmonary opacity. Small right pleural effusion. No focal airspace opacity. Osseous structures unremarkable. IMPRESSION: Gross cardiomegaly with diffuse bilateral interstitial pulmonary opacity, consistent with edema. Small right pleural effusion. No focal  airspace opacity. Electronically Signed   By: Jearld Lesch M.D.   On: 12/11/2022 21:17    EKG: My personal interpretation of EKG shows: NSR, LBBB    Assessment/Plan Principal Problem:   Volume overload Active Problems:   Acute respiratory failure with hypoxia (HCC)   End-stage renal disease on hemodialysis (HCC)   Essential hypertension   History of anemia due to chronic kidney disease   Chronic combined systolic and diastolic heart failure (HCC)   Hypotension   DNR (do not resuscitate)/DNI(Do Not Intubate)    Assessment and Plan: * Volume overload Observation med/surg bed. Due to pt intentionally skipping HD yesterday. EDP has contact nephrology for HD tomorrow.  End-stage renal disease on hemodialysis (HCC) On HD on M, W, F. Pt states he felt bad yesterday and wanted to skip HD to make himself "feel better". Pt still lives at home with his elderly wife.  Acute respiratory failure with hypoxia (HCC) Currently on 5 L/min. No record of pt needing long term home O2. Will attempt to wean off O2 after HD session.  Chronic combined systolic and diastolic heart failure (HCC) LVEF 30%. Currently exacerbated due to pt intentionally missing HD. Will need HD to remove extra volume.  History of anemia due to chronic kidney disease Chronic.  Essential hypertension Hx of hypertension. Dtr(christine) states that pt had some low BP during HD on Wednesday. Will hold lopressor and continue midodrine.  DNR (do not  resuscitate)/DNI(Do Not Intubate) Verified DNR with pt's dtr Christine hale    Hypotension Continue with midodrine tid.   DVT prophylaxis: SQ Heparin Code Status: DNR/DNI(Do NOT Intubate). Verified DNR form. Discussed with dtr Rolly Salter Family Communication: discussed with pt's dtr christine hale  Disposition Plan: return home  Consults called: EDP has consulted nephrology  Admission status: Observation, Med-Surg   Carollee Herter, DO [facility] 12/11/2022, 11:27 PM

## 2022-12-11 NOTE — Assessment & Plan Note (Signed)
LVEF 30%. Currently exacerbated due to pt intentionally missing HD. Will need HD to remove extra volume.

## 2022-12-11 NOTE — Assessment & Plan Note (Signed)
Chronic. 

## 2022-12-11 NOTE — Assessment & Plan Note (Signed)
On HD on M, W, F. Pt states he felt bad yesterday and wanted to skip HD to make himself "feel better". Pt still lives at home with his elderly wife.

## 2022-12-12 DIAGNOSIS — I120 Hypertensive chronic kidney disease with stage 5 chronic kidney disease or end stage renal disease: Secondary | ICD-10-CM | POA: Diagnosis not present

## 2022-12-12 DIAGNOSIS — I5043 Acute on chronic combined systolic (congestive) and diastolic (congestive) heart failure: Secondary | ICD-10-CM | POA: Diagnosis present

## 2022-12-12 DIAGNOSIS — Z66 Do not resuscitate: Secondary | ICD-10-CM | POA: Diagnosis present

## 2022-12-12 DIAGNOSIS — E8770 Fluid overload, unspecified: Secondary | ICD-10-CM | POA: Diagnosis not present

## 2022-12-12 DIAGNOSIS — K219 Gastro-esophageal reflux disease without esophagitis: Secondary | ICD-10-CM | POA: Diagnosis present

## 2022-12-12 DIAGNOSIS — J8410 Pulmonary fibrosis, unspecified: Secondary | ICD-10-CM | POA: Diagnosis present

## 2022-12-12 DIAGNOSIS — E8779 Other fluid overload: Secondary | ICD-10-CM | POA: Diagnosis not present

## 2022-12-12 DIAGNOSIS — R636 Underweight: Secondary | ICD-10-CM | POA: Diagnosis present

## 2022-12-12 DIAGNOSIS — Z681 Body mass index (BMI) 19 or less, adult: Secondary | ICD-10-CM | POA: Diagnosis not present

## 2022-12-12 DIAGNOSIS — Z992 Dependence on renal dialysis: Secondary | ICD-10-CM | POA: Diagnosis not present

## 2022-12-12 DIAGNOSIS — I5042 Chronic combined systolic (congestive) and diastolic (congestive) heart failure: Secondary | ICD-10-CM | POA: Diagnosis not present

## 2022-12-12 DIAGNOSIS — Z91158 Patient's noncompliance with renal dialysis for other reason: Secondary | ICD-10-CM | POA: Diagnosis not present

## 2022-12-12 DIAGNOSIS — Z1152 Encounter for screening for COVID-19: Secondary | ICD-10-CM | POA: Diagnosis not present

## 2022-12-12 DIAGNOSIS — I132 Hypertensive heart and chronic kidney disease with heart failure and with stage 5 chronic kidney disease, or end stage renal disease: Secondary | ICD-10-CM | POA: Diagnosis present

## 2022-12-12 DIAGNOSIS — J9601 Acute respiratory failure with hypoxia: Secondary | ICD-10-CM | POA: Diagnosis not present

## 2022-12-12 DIAGNOSIS — G936 Cerebral edema: Secondary | ICD-10-CM | POA: Diagnosis present

## 2022-12-12 DIAGNOSIS — Z96642 Presence of left artificial hip joint: Secondary | ICD-10-CM | POA: Diagnosis present

## 2022-12-12 DIAGNOSIS — D631 Anemia in chronic kidney disease: Secondary | ICD-10-CM | POA: Diagnosis not present

## 2022-12-12 DIAGNOSIS — R531 Weakness: Secondary | ICD-10-CM | POA: Diagnosis not present

## 2022-12-12 DIAGNOSIS — G931 Anoxic brain damage, not elsewhere classified: Secondary | ICD-10-CM | POA: Diagnosis not present

## 2022-12-12 DIAGNOSIS — I10 Essential (primary) hypertension: Secondary | ICD-10-CM

## 2022-12-12 DIAGNOSIS — Y92009 Unspecified place in unspecified non-institutional (private) residence as the place of occurrence of the external cause: Secondary | ICD-10-CM | POA: Diagnosis not present

## 2022-12-12 DIAGNOSIS — T8241XA Breakdown (mechanical) of vascular dialysis catheter, initial encounter: Secondary | ICD-10-CM | POA: Diagnosis not present

## 2022-12-12 DIAGNOSIS — L03115 Cellulitis of right lower limb: Secondary | ICD-10-CM | POA: Diagnosis present

## 2022-12-12 DIAGNOSIS — W19XXXA Unspecified fall, initial encounter: Secondary | ICD-10-CM | POA: Diagnosis present

## 2022-12-12 DIAGNOSIS — Z515 Encounter for palliative care: Secondary | ICD-10-CM | POA: Diagnosis not present

## 2022-12-12 DIAGNOSIS — R0602 Shortness of breath: Secondary | ICD-10-CM | POA: Diagnosis not present

## 2022-12-12 DIAGNOSIS — N186 End stage renal disease: Secondary | ICD-10-CM

## 2022-12-12 DIAGNOSIS — I953 Hypotension of hemodialysis: Secondary | ICD-10-CM | POA: Diagnosis not present

## 2022-12-12 DIAGNOSIS — E781 Pure hyperglyceridemia: Secondary | ICD-10-CM | POA: Diagnosis present

## 2022-12-12 DIAGNOSIS — R54 Age-related physical debility: Secondary | ICD-10-CM | POA: Diagnosis present

## 2022-12-12 DIAGNOSIS — Z7189 Other specified counseling: Secondary | ICD-10-CM | POA: Diagnosis not present

## 2022-12-12 DIAGNOSIS — N250 Renal osteodystrophy: Secondary | ICD-10-CM | POA: Diagnosis not present

## 2022-12-12 LAB — PHOSPHORUS: Phosphorus: 4.8 mg/dL — ABNORMAL HIGH (ref 2.5–4.6)

## 2022-12-12 LAB — TROPONIN I (HIGH SENSITIVITY): Troponin I (High Sensitivity): 226 ng/L (ref <18)

## 2022-12-12 LAB — HEPATITIS B SURFACE ANTIGEN: Hepatitis B Surface Ag: NONREACTIVE

## 2022-12-12 LAB — MRSA NEXT GEN BY PCR, NASAL: MRSA by PCR Next Gen: NOT DETECTED

## 2022-12-12 MED ORDER — HEPARIN SODIUM (PORCINE) 1000 UNIT/ML DIALYSIS
4000.0000 [IU] | Freq: Once | INTRAMUSCULAR | Status: AC
  Administered 2022-12-12: 4000 [IU] via INTRAVENOUS_CENTRAL
  Filled 2022-12-12: qty 4

## 2022-12-12 MED ORDER — ALTEPLASE 2 MG IJ SOLR
INTRAMUSCULAR | Status: AC
  Administered 2022-12-12: 3.8 mg
  Filled 2022-12-12: qty 4

## 2022-12-12 MED ORDER — LIDOCAINE HCL (PF) 1 % IJ SOLN
INTRAMUSCULAR | Status: AC
  Administered 2022-12-12: 5 mL
  Filled 2022-12-12: qty 5

## 2022-12-12 MED ORDER — HEPARIN SODIUM (PORCINE) 5000 UNIT/ML IJ SOLN
5000.0000 [IU] | Freq: Three times a day (TID) | INTRAMUSCULAR | Status: DC
  Administered 2022-12-12 – 2022-12-13 (×3): 5000 [IU] via SUBCUTANEOUS
  Filled 2022-12-12 (×2): qty 1

## 2022-12-12 MED ORDER — ACETAMINOPHEN 650 MG RE SUPP: 650.0000 mg | Freq: Four times a day (QID) | RECTAL | Status: DC | PRN

## 2022-12-12 MED ORDER — LIDOCAINE-EPINEPHRINE (PF) 2 %-1:200000 IJ SOLN
INTRAMUSCULAR | Status: AC
  Filled 2022-12-12: qty 20

## 2022-12-12 MED ORDER — ONDANSETRON HCL 4 MG/2ML IJ SOLN: 4.0000 mg | Freq: Four times a day (QID) | INTRAMUSCULAR | Status: DC | PRN

## 2022-12-12 MED ORDER — MIDODRINE HCL 5 MG PO TABS
10.0000 mg | ORAL_TABLET | Freq: Three times a day (TID) | ORAL | Status: DC
  Administered 2022-12-12 (×2): 10 mg via ORAL
  Filled 2022-12-12 (×2): qty 2

## 2022-12-12 MED ORDER — ACETAMINOPHEN 325 MG PO TABS: 650.0000 mg | ORAL_TABLET | Freq: Four times a day (QID) | ORAL | Status: DC | PRN

## 2022-12-12 MED ORDER — CHLORHEXIDINE GLUCONATE CLOTH 2 % EX PADS: 6.0000 | MEDICATED_PAD | Freq: Every day | CUTANEOUS | Status: DC

## 2022-12-12 MED ORDER — HEPARIN SODIUM (PORCINE) 1000 UNIT/ML IJ SOLN
INTRAMUSCULAR | Status: AC
  Filled 2022-12-12: qty 4

## 2022-12-12 MED ORDER — ONDANSETRON HCL 4 MG PO TABS: 4.0000 mg | ORAL_TABLET | Freq: Four times a day (QID) | ORAL | Status: DC | PRN

## 2022-12-12 MED ORDER — HEPARIN SODIUM (PORCINE) 1000 UNIT/ML DIALYSIS: 1000.0000 [IU] | INTRAMUSCULAR | Status: DC | PRN

## 2022-12-12 MED ORDER — CALCIUM ACETATE (PHOS BINDER) 667 MG PO CAPS: 667.0000 mg | ORAL_CAPSULE | Freq: Three times a day (TID) | ORAL | Status: DC

## 2022-12-12 MED ORDER — ALTEPLASE 2 MG IJ SOLR
INTRAMUSCULAR | Status: AC
  Filled 2022-12-12: qty 4

## 2022-12-12 MED ORDER — MELATONIN 3 MG PO TABS
3.0000 mg | ORAL_TABLET | Freq: Every evening | ORAL | Status: DC | PRN
  Administered 2022-12-12: 3 mg via ORAL
  Filled 2022-12-12: qty 1

## 2022-12-12 MED ORDER — HEPARIN SODIUM (PORCINE) 5000 UNIT/ML IJ SOLN
INTRAMUSCULAR | Status: AC
  Filled 2022-12-12: qty 4

## 2022-12-12 MED ORDER — SODIUM CHLORIDE 0.9 % IV SOLN
1.0000 g | INTRAVENOUS | Status: DC
  Filled 2022-12-12: qty 10

## 2022-12-12 MED ORDER — HEPARIN SODIUM (PORCINE) 1000 UNIT/ML IJ SOLN
INTRAMUSCULAR | Status: AC
  Administered 2022-12-12: 4000 [IU] via INTRAVENOUS_CENTRAL
  Filled 2022-12-12: qty 4

## 2022-12-12 MED ORDER — PANTOPRAZOLE SODIUM 40 MG PO TBEC
40.0000 mg | DELAYED_RELEASE_TABLET | Freq: Every day | ORAL | Status: DC
  Administered 2022-12-12: 40 mg via ORAL
  Filled 2022-12-12: qty 1

## 2022-12-12 NOTE — Progress Notes (Signed)
 [facility] Progress Note  Subjective: seen in HD unit this am.  Pt is confused. Pt's TDC was not working well and was TPA'd x 1 hr but still did not work.  HD aborted. CCM was kind enough to place temp HD cath this afternoon in the groin.   Vitals:   12/12/22 1144 12/12/22 1203 12/12/22 1214 12/12/22 1231  BP: (!) 98/43 111/67  (!) 117/48  Pulse: 92 91 94 82  Resp: (!) 26 (!) 27 (!) 23 (!) 24  Temp:      TempSrc:      SpO2: 98% 100% (!) 87% 94%  Weight:      Height:        Exam: Gen elderly frail WM, confused, not in distress Chest bilat crackles 1/4 up,  no bronch BS RRR no RG Abd soft ntnd no mass or ascites +bs GU normal male MS no joint effusions or deformity Ext 1+ bilat pretib edema, erythema of lower R leg Neuro is deconditioned and lethargic, confused    RIJ TDC in place/ new temp cath placed in groin per CCM     Home meds include - phoslo 1 ac tid, duneb, metoprolol 12.5 bid, midodrine 10 mg tid, omeprazole, tamsulosin, prns        OP HD: [facility] T[facility] - started HD 11/2020  3h  400/1.5   50.2kg   2/2 bath  RIJ TDC/ AVF   Heparin 4000 - consistently coming off 3- 4kg over for the last 2 wks - last OP HD 8/15, post wt 53.6kg - rocaltrol 0.5 mcg po tiw     Assessment/ Plan: Acute hypoxic resp failure - w/ volume overload / edema on exam, and pulm edema by CXR. Pt is on 5 L [place] here. We were unable to dialyze patient this morning 8/18 due to malfunctioning TDC. CCM was kind enough to place a temp cath this afternoon. Will re-attempt dialysis later tonight using this catheter.  ESRD - on HD TTS. Missed OP HD today, last OP HD was 8/15. HD tonight as above.  HFrEF - EF is 30% HTN - hold metoprolol for SBP < 120 please. Cont midodrine here as at home w/ 10mg  tid.  Anemia esrd - Hb 14, no esa needs.  MBD ckd - CCa slightly high at 10.8, holding rocaltrol for now. Phos is in range, cont phoslo as binder.  Deconditioning - severely deconditioned. Lives in  facility.  RLE cellulitis - per pmd, started on IV abx      Vinson Moselle MD  [facility] 12/12/2022, 12:59 PM  Recent Labs  Lab 12/11/22 2111 12/11/22 2331  HGB 14.1  --   ALBUMIN 2.5*  --   CALCIUM 9.6  --   PHOS  --  4.8*  CREATININE 4.83*  --   K 4.3  --    No results for input(s): "IRON", "TIBC", "FERRITIN" in the last 168 hours. Inpatient medications:  Chlorhexidine Gluconate Cloth  6 each Topical Q0600   heparin  5,000 Units Subcutaneous Q8H   heparin sodium (porcine)       midodrine  10 mg Oral TID WC   pantoprazole  40 mg Oral Daily    cefTRIAXone (ROCEPHIN)  IV     acetaminophen **OR** acetaminophen, heparin sodium (porcine), melatonin, ondansetron **OR** ondansetron (ZOFRAN) IV

## 2022-12-12 NOTE — Progress Notes (Signed)
SLP Cancellation Note  Patient Details Name: Jeffrey Payne MRN: [id number] DOB: 1933-01-03   Cancelled treatment:       Reason Eval/Treat Not Completed: Patient at procedure or test/unavailable   Masey Scheiber, Riley Nearing 12/12/2022, 9:53 AM

## 2022-12-12 NOTE — Progress Notes (Signed)
New Admission Note: 59m-18   Arrival Method: ED stretcher Mental Orientation: a/ox4 Telemetry: n/a Assessment:completed Skin: skin tears on arms bilaterally  IV: right forearm Pain:n/a Tubes: n/a Safety Measures: bed alarm, nonskid socks Admission: completed Orientation: Patient has been oriented to the room, unit and staff.  Family: n/a Belongings: at bedside  Orders have been reviewed and implemented. Will continue to monitor the patient. Call light has been placed within reach and bed alarm has been activated.   Fabian Sharp BSN, RN-BC Phone number: [phone]

## 2022-12-12 NOTE — Progress Notes (Addendum)
Patient transferred to 3M03 for HD placement.  Patient tolerated well.  Vital signs stable during procedure.  Patient will be transferred back to 75M18 now.    Report called and given to [facility], RN on 75M.

## 2022-12-12 NOTE — Plan of Care (Signed)

## 2022-12-12 NOTE — Procedures (Signed)
Central Venous Catheter Insertion Procedure Note  Jeffrey Payne  [id number]  [birth year]  Date:12/12/22  Time:2:33 PM   Provider Performing:Alonni Heimsoth D. Harris   Procedure: Insertion of Non-tunneled Central Venous Catheter(36556)with US guidance (40347)    Indication(s) Hemodialysis  Consent Risks of the procedure as well as the alternatives and risks of each were explained to the patient and/or caregiver.  Consent for the procedure was obtained and is signed in the bedside chart  Anesthesia Topical only with 1% lidocaine   Timeout Verified patient identification, verified procedure, site/side was marked, verified correct patient position, special equipment/implants available, medications/allergies/relevant history reviewed, required imaging and test results available.  Sterile Technique Maximal sterile technique including full sterile barrier drape, hand hygiene, sterile gown, sterile gloves, mask, hair covering, sterile ultrasound probe cover (if used).  Procedure Description Area of catheter insertion was cleaned with chlorhexidine and draped in sterile fashion.   With real-time ultrasound guidance a HD catheter was placed into the right femoral vein.  Nonpulsatile blood flow and easy flushing noted in all ports.  The catheter was sutured in place and sterile dressing applied.  Complications/Tolerance None; patient tolerated the procedure well. Chest X-ray is ordered to verify placement for internal jugular or subclavian cannulation.  Chest x-ray is not ordered for femoral cannulation.  EBL Minimal  Specimen(s) None  Jeffrey Payne D. Harris, NP-C [facility] Personal contact information can be found on Amion  If no contact or response made please call [phone] 12/12/2022, 2:34 PM

## 2022-12-12 NOTE — Progress Notes (Signed)
[facility]  PROGRESS NOTE  Jeffrey Payne UEA:[id number] DOB: May 08, 1932 DOA: 12/11/2022 PCP: Orpha Bur, MD   Brief HPI:   89 year old male history of end-stage renal disease on hemodialysis Monday Wednesday Friday via right-sided PermCath, hypertension, anemia chronic kidney disease, chronic systolic heart failure EF of 30%, chronic hypotension on midodrine, pulmonary fibrosis who presented to the ER  with shortness of breath and hypoxia.   patient said that he felt bad and did not go to hemodialysis. He called EMS, found to be hypoxic.  Was transferred to [facility]. Chest x-ray showed diffuse pulmonary edema, right-sided dialysis catheter Nephrology was consulted    Assessment/Plan:    Volume overload -Due to missed hemodialysis -Patient getting hemodialysis today  Right lower extremity cellulitis -Right lower extremity is warm to touch, tender to palpation, erythematous -Will start ceftriaxone  ESRD on hemodialysis -Patient on hemodialysis MWF -Missed hemodialysis -Nephrology consulted  Acute hypoxic respiratory failure -Currently requiring 5 L/min of oxygen -Wean off oxygen as tolerated after dialysis  Chronic combined systolic and diastolic CHF -EF 47% -Getting dialyzed to remove excessive fluid  Anemia due to CKD -  Hypertension -Was hypotensive during hemodialysis -Hold glipizide, continue monitoring  Hypotension -Continue midodrine 10 mg p.o. 3 times daily   Medications     Chlorhexidine Gluconate Cloth  6 each Topical Q0600   heparin  5,000 Units Subcutaneous Q8H   midodrine  10 mg Oral TID WC   pantoprazole  40 mg Oral Daily     Data Reviewed:   CBG:  No results for input(s): "GLUCAP" in the last 168 hours.  SpO2: 94 % O2 Flow Rate (L/min): 6 L/min    Vitals:   12/12/22 0814 12/12/22 0904 12/12/22 1016 12/12/22 1029  BP:  121/73 (!) 93/45 (!) 95/48  Pulse:  86 88 86  Resp:  19 (!) 26 (!) 32  Temp:  98.7 F (37.1  C)    TempSrc:      SpO2: 90% 90% 97% 94%  Weight:      Height:          Data Reviewed:  Basic Metabolic Panel: Recent Labs  Lab 12/11/22 2111 12/11/22 2331  NA 138  --   K 4.3  --   CL 97*  --   CO2 21*  --   GLUCOSE 129*  --   BUN 34*  --   CREATININE 4.83*  --   CALCIUM 9.6  --   PHOS  --  4.8*    CBC: Recent Labs  Lab 12/11/22 2111  WBC 12.2*  NEUTROABS 9.9*  HGB 14.1  HCT 42.5  MCV 108.4*  PLT 121*    LFT Recent Labs  Lab 12/11/22 2111  AST 144*  ALT 58*  ALKPHOS 124  BILITOT 1.7*  PROT 5.5*  ALBUMIN 2.5*     Antibiotics: Anti-infectives (From admission, onward)    None        DVT prophylaxis: Heparin  Code Status: DNR  Family Communication: No family at bedside   CONSULTS    Subjective   Somnolent this morning, arousable.  Denies shortness of breath   Objective    Physical Examination:   General-appears in no acute distress Heart-S1-S2, regular, no murmur auscultated Lungs-clear to auscultation bilaterally, no wheezing or crackles auscultated Abdomen-soft, nontender, no organomegaly Extremities-right lower extremities warm to touch, tender to palpation, erythematous Neuro-alert, oriented x3, no focal deficit noted  Status is: Inpatient:  Meredeth Ide   [facility] If 7PM-7AM, please contact night-coverage at [email or web address], Office  [phone]   12/12/2022, 11:17 AM  LOS: 0 days

## 2022-12-12 NOTE — Procedures (Addendum)
HD Note:  Some information was entered later than the data was gathered due to patient care needs. The stated time with the data is accurate.  Received patient in bed to unit.   Patient disoriented xy  Informed consent signed and in chart.   Patient catheter for arterial pressures were not WDL. The interventions were cath flow dwell for 1 hour, reversal of the lines, multiple NS flushes, change of position of patient.  Dr. Arlean Hopping notified and ordered that patient be returned to his room  New order to dwell cath flow in HD cath until next treatment.  Dr. Arlean Hopping requested intervention with the catheter.  Patient will go to an ICU for treatment instead of going back to previous room from here.  Transported back to the room   Hand-off given to patient's nurse.   Kenyen Candy L. Dareen Piano, RN [facility].

## 2022-12-12 NOTE — Consult Note (Signed)
WOC Nurse Consult Note: Reason for Consult:Bilateral upper extremity skin tears Wound type:trauma Pressure Injury POA: N/A Measurement:To be measured by bedside RN at time of application of next dressing change Wound bed:red, moist Drainage (amount, consistency, odor) small serosanguinous  Periwound: ecchymosis, dry Dressing procedure/placement/frequency: Wound care to skin tears is guided by the standing order skin care order set and will consist of daily NS cleanse followed by covering the lesions with antimicrobial nonadherent (xeroform) gauze. Top with dry gauze, secure with a few turns of Kerlix roll gauze/paper tape. A sacral foam is to be applied for PI prevention at the sacrum, bilateral Prevalon boots are provided for heel PI prevention.  WOC nursing team will not follow, but will remain available to this patient, the nursing and medical teams.  Please re-consult if needed.  Thank you for inviting [place] to participate in this patient's Plan of Care.  Ladona Mow, MSN, RN, CNS, GNP, Leda Min, [employer], [employer] phone:  [phone]

## 2022-12-13 DIAGNOSIS — J9601 Acute respiratory failure with hypoxia: Secondary | ICD-10-CM | POA: Diagnosis not present

## 2022-12-13 DIAGNOSIS — Z515 Encounter for palliative care: Secondary | ICD-10-CM

## 2022-12-13 DIAGNOSIS — E8770 Fluid overload, unspecified: Secondary | ICD-10-CM | POA: Diagnosis not present

## 2022-12-13 DIAGNOSIS — Z7189 Other specified counseling: Secondary | ICD-10-CM

## 2022-12-13 DIAGNOSIS — N186 End stage renal disease: Secondary | ICD-10-CM | POA: Diagnosis not present

## 2022-12-13 DIAGNOSIS — I5042 Chronic combined systolic (congestive) and diastolic (congestive) heart failure: Secondary | ICD-10-CM | POA: Diagnosis not present

## 2022-12-13 LAB — GLUCOSE, CAPILLARY
Glucose-Capillary: 64 mg/dL — ABNORMAL LOW (ref 70–99)
Glucose-Capillary: 115 mg/dL — ABNORMAL HIGH (ref 70–99)

## 2022-12-13 MED ORDER — LORAZEPAM 2 MG/ML IJ SOLN
0.5000 mg | INTRAMUSCULAR | Status: DC | PRN
  Administered 2022-12-13: 1 mg via INTRAVENOUS
  Administered 2022-12-13: 0.5 mg via INTRAVENOUS
  Filled 2022-12-13: qty 1

## 2022-12-13 MED ORDER — HEPARIN SODIUM (PORCINE) 1000 UNIT/ML IJ SOLN
INTRAMUSCULAR | Status: AC
  Filled 2022-12-13: qty 5

## 2022-12-13 MED ORDER — GLYCOPYRROLATE 1 MG PO TABS: 1.0000 mg | ORAL_TABLET | ORAL | Status: DC | PRN

## 2022-12-13 MED ORDER — DEXTROSE 50 % IV SOLN
INTRAVENOUS | Status: AC
  Administered 2022-12-13: 12.5 g
  Filled 2022-12-13: qty 50

## 2022-12-13 MED ORDER — ACETAMINOPHEN 325 MG PO TABS: 650.0000 mg | ORAL_TABLET | Freq: Four times a day (QID) | ORAL | Status: DC | PRN

## 2022-12-13 MED ORDER — SODIUM CHLORIDE 0.9 % IV SOLN: INTRAVENOUS | Status: DC

## 2022-12-13 MED ORDER — LEVALBUTEROL HCL 0.63 MG/3ML IN NEBU: 0.6300 mg | INHALATION_SOLUTION | Freq: Four times a day (QID) | RESPIRATORY_TRACT | Status: DC | PRN

## 2022-12-13 MED ORDER — FENTANYL 2500MCG IN NS 250ML (10MCG/ML) PREMIX INFUSION
12.5000 ug/h | INTRAVENOUS | Status: DC
  Administered 2022-12-13: 12.5 ug/h via INTRAVENOUS
  Filled 2022-12-13: qty 250

## 2022-12-13 MED ORDER — ACETAMINOPHEN 650 MG RE SUPP: 650.0000 mg | Freq: Four times a day (QID) | RECTAL | Status: DC | PRN

## 2022-12-13 MED ORDER — HALOPERIDOL LACTATE 5 MG/ML IJ SOLN
1.0000 mg | Freq: Four times a day (QID) | INTRAMUSCULAR | Status: DC | PRN
  Administered 2022-12-13: 1 mg via INTRAVENOUS
  Filled 2022-12-13: qty 1

## 2022-12-13 MED ORDER — FENTANYL CITRATE PF 50 MCG/ML IJ SOSY
50.0000 ug | PREFILLED_SYRINGE | INTRAMUSCULAR | Status: DC | PRN
  Administered 2022-12-13: 50 ug via INTRAVENOUS
  Filled 2022-12-13: qty 1

## 2022-12-13 MED ORDER — GLYCOPYRROLATE 0.2 MG/ML IJ SOLN: 0.2000 mg | INTRAMUSCULAR | Status: DC | PRN

## 2022-12-13 MED ORDER — DEXTROSE 50 % IV SOLN: 12.5000 g | INTRAVENOUS | Status: DC | PRN

## 2022-12-13 MED ORDER — FENTANYL BOLUS VIA INFUSION
12.5000 ug | INTRAVENOUS | Status: DC | PRN
  Administered 2022-12-13 – 2022-12-14 (×10): 12.5 ug via INTRAVENOUS

## 2022-12-13 MED ORDER — DEXTROSE 50 % IV SOLN
12.5000 g | INTRAVENOUS | Status: AC
  Administered 2022-12-13: 12.5 g via INTRAVENOUS

## 2022-12-13 MED ORDER — LORAZEPAM 2 MG/ML IJ SOLN
0.5000 mg | Freq: Four times a day (QID) | INTRAMUSCULAR | Status: DC
  Administered 2022-12-13 – 2022-12-14 (×4): 0.5 mg via INTRAVENOUS
  Filled 2022-12-13 (×5): qty 1

## 2022-12-13 MED ORDER — POLYVINYL ALCOHOL 1.4 % OP SOLN: 1.0000 [drp] | Freq: Four times a day (QID) | OPHTHALMIC | Status: DC | PRN

## 2022-12-13 NOTE — Progress Notes (Signed)
[facility]  PROGRESS NOTE  Jeffrey Payne ZOX:[id number] DOB: May 06, 1932 DOA: 12/11/2022 PCP: Orpha Bur, MD   Brief HPI:   89 year old male history of end-stage renal disease on hemodialysis Monday Wednesday Friday via right-sided PermCath, hypertension, anemia chronic kidney disease, chronic systolic heart failure EF of 30%, chronic hypotension on midodrine, pulmonary fibrosis who presented to the ER  with shortness of breath and hypoxia.   patient said that he felt bad and did not go to hemodialysis. He called EMS, found to be hypoxic.  Was transferred to [facility]. Chest x-ray showed diffuse pulmonary edema, right-sided dialysis catheter Nephrology was consulted    Assessment/Plan:     Delirium -Patient confused this morning, likely hypoxic encephalopathy -Versus developing sepsis; he was initiated on ceftriaxone yesterday for right lower extremity cellulitis    Volume overload -Due to missed hemodialysis -Patient got temporary HD catheter yesterday and was dialyzed last night  Right lower extremity cellulitis -Right lower extremity is warm to touch, tender to palpation, erythematous -Was started on ceftriaxone yesterday  ESRD on hemodialysis -Patient on hemodialysis MWF -Missed hemodialysis -Nephrology consulted  Acute hypoxic respiratory failure -Currently requiring 5 L/min of oxygen -Wean off oxygen as tolerated after dialysis  Chronic combined systolic and diastolic CHF -EF 81% -Getting dialyzed to remove excessive fluid  Anemia due to CKD -  Hypertension -Was hypotensive during hemodialysis -Hold glipizide, continue monitoring  Hypotension -Continue midodrine 10 mg p.o. 3 times daily  Goals of care -Goals of care discussion done with patient's family -They have opted for comfort measures only; start fentanyl 50 mcg every 2 hours as needed for comfort -Will initiate comfort measures, palliative care consulted  Medications      Chlorhexidine Gluconate Cloth  6 each Topical Q0600   Chlorhexidine Gluconate Cloth  6 each Topical Q0600   heparin sodium (porcine)         Data Reviewed:   CBG:  Recent Labs  Lab 12/13/22 0541 12/13/22 0754  GLUCAP 64* 115*    SpO2: 97 % O2 Flow Rate (L/min): 6 L/min    Vitals:   12/13/22 0407 12/13/22 0422 12/13/22 0535 12/13/22 0545  BP: (!) 106/48 (!) 127/35 98/62   Pulse: (!) 47 (!) 44 78 81  Resp:  (!) 23 (!) 29 (!) 28  Temp:  98.4 F (36.9 C) 97.8 F (36.6 C)   TempSrc:  Oral Rectal   SpO2: 96% (!) 88% (!) 89% 97%  Weight:      Height:          Data Reviewed:  Basic Metabolic Panel: Recent Labs  Lab 12/11/22 2111 12/11/22 2331  NA 138  --   K 4.3  --   CL 97*  --   CO2 21*  --   GLUCOSE 129*  --   BUN 34*  --   CREATININE 4.83*  --   CALCIUM 9.6  --   PHOS  --  4.8*    CBC: Recent Labs  Lab 12/11/22 2111  WBC 12.2*  NEUTROABS 9.9*  HGB 14.1  HCT 42.5  MCV 108.4*  PLT 121*    LFT Recent Labs  Lab 12/11/22 2111  AST 144*  ALT 58*  ALKPHOS 124  BILITOT 1.7*  PROT 5.5*  ALBUMIN 2.5*     Antibiotics: Anti-infectives (From admission, onward)    Start     Dose/Rate Route Frequency Ordered Stop   12/12/22 1130  cefTRIAXone (ROCEPHIN) 1 g in sodium chloride 0.9 % 100 mL  IVPB  Status:  Discontinued        1 g 200 mL/hr over 30 Minutes Intravenous Every 24 hours 12/12/22 1125 12/13/22 0808        DVT prophylaxis: Heparin  Code Status: DNR  Family Communication: No family at bedside   CONSULTS    Subjective   Patient seen, hallucinating this morning.   Objective    Physical Examination:  Alert, confused S1-S2, regular Lungs clear to auscultation bilaterally   Status is: Inpatient:             Meredeth Ide   [facility] If 7PM-7AM, please contact night-coverage at [email or web address], Office  [phone]   12/13/2022, 8:14 AM  LOS: 1 day

## 2022-12-13 NOTE — Progress Notes (Addendum)
This chaplain responded to PMT NP-Michele consult for EOL spiritual care. The Pt. preferred name is Jeffrey Payne. The Pt. wife-[name], daughter, and son in law are at the bedside.   The chaplain understands the family hopes to have bedside prayers with the Pt. in addition to a community clergy visit. The family will request a chaplain visit through the RN  Through storytelling and reflective listening, the chaplain began grief care with [facility]. [name] connected with the role of memories as she visits learning to live with the loss of her husband. [name] is supported by community and family.   This chaplain is available for F/U spiritual care as needed.  **1255  This chaplain returned to the Pt. bedside for prayer with family.   Chaplain Stephanie Acre [phone]

## 2022-12-13 NOTE — Progress Notes (Signed)
Received patient in bed to unit.  Alert and confused,.  Informed consent signed and in chart.   TX duration:2.48 hours  Patient not tolerated well, stopped responding, and HD tx stopped  Transported back to the room  Alert, confused, SOB  Hand-off given to patient's nurse.   Access used: catheter Access issues: none  Total UF removed: 2000 ml Medication(s) given: none Post HD VS: 104/46 Post HD weight: 51.3 kg   12/13/22 0422  Vitals  Temp 98.4 F (36.9 C)  Temp Source Oral  BP (!) 127/35  MAP (mmHg) (!) 55  BP Location Left Leg  BP Method Automatic  Patient Position (if appropriate) Lying  Pulse Rate (!) 44  Pulse Rate Source Monitor  ECG Heart Rate 96  Resp (!) 23  Oxygen Therapy  SpO2 (!) 88 %  O2 Flow Rate (L/min) 6 L/min  Patient Activity (if Appropriate) In bed  Pulse Oximetry Type Continuous  During Treatment Monitoring  Blood Flow Rate (mL/min) 0 mL/min  Arterial Pressure (mmHg) -25.86 mmHg  Venous Pressure (mmHg) 25.45 mmHg  TMP (mmHg) 16.77 mmHg  Ultrafiltration Rate (mL/min) 1270 mL/min  Dialysate Flow Rate (mL/min) 299 ml/min  Intra-Hemodialysis Comments Tx completed (pt stopped responding.)  Bolus Amount (mL) 100 mL  Dialysate Potassium Concentration 3  Dialysate Calcium Concentration 2.5  Duration of HD Treatment -hour(s) 2.8 hour(s)  Cumulative Fluid Removed (mL) per Treatment  2115.89  Post Treatment  Dialyzer Clearance Lightly streaked  Hemodialysis Intake (mL) 100 mL  Liters Processed 55.2  Fluid Removed (mL) 2000 mL  Tolerated HD Treatment No (Comment)  Post-Hemodialysis Comments HD tx not completed as expected, pt is partially able to tolerated the tx,  he stopped responding and HD tx stopped.  AVG/AVF Arterial Site Held (minutes) 0 minutes  AVG/AVF Venous Site Held (minutes) 0 minutes  Hemodialysis Catheter Right Subclavian  No placement date or time found.   Placed prior to admission: Yes  Orientation: Right  Access Location:  Subclavian  Site Condition No complications  Blue Lumen Status Flushed;Heparin locked;Dead end cap in place  Red Lumen Status Flushed;Heparin locked;Dead end cap in place  Purple Lumen Status N/A  Catheter fill solution Heparin 1000 units/ml  Catheter fill volume (Arterial) 1.6 cc  Catheter fill volume (Venous) 1.6  Dressing Type Transparent  Dressing Status Antimicrobial disc in place  Drainage Description Sanguineous  Post treatment catheter status Capped and Clamped  Hemodialysis Catheter Right Femoral vein Triple lumen Temporary (Non-Tunneled)  Placement Date/Time: 12/12/22 1400   Placed prior to admission: No  Orientation: Right  Access Location: Femoral vein  Hemodialysis Catheter Type: Triple lumen Temporary (Non-Tunneled)  Site Condition No complications  Blue Lumen Status Flushed;Heparin locked;Dead end cap in place  Red Lumen Status Flushed;Heparin locked;Dead end cap in place  Purple Lumen Status N/A  Catheter fill solution Heparin 1000 units/ml  Catheter fill volume (Arterial) 1.6 cc  Catheter fill volume (Venous) 1.6  Dressing Type Transparent  Dressing Status Antimicrobial disc in place  Drainage Description Sanguineous  Dressing Change Due 12/13/22  Post treatment catheter status Capped and Clamped

## 2022-12-13 NOTE — Progress Notes (Signed)
[facility] Progress Note  Subjective: seen in room. CCM were kind enough to place temp cath yesterday and pt did get HD late last night overnight. Net UF was 2115 ml.  Palliative care met w/ family today  and plan is for comfort care.   Vitals:   12/13/22 0422 12/13/22 0535 12/13/22 0545 12/13/22 0825  BP: (!) 127/35 98/62  102/62  Pulse: (!) 44 78 81 76  Resp: (!) 23 (!) 29 (!) 28 19  Temp: 98.4 F (36.9 C) 97.8 F (36.6 C)  98 F (36.7 C)  TempSrc: Oral Rectal  Oral  SpO2: (!) 88% (!) 89% 97% 92%  Weight:      Height:        Exam: Gen elderly frail WM, confused, agitated Chest mostly clear today RRR no RG Abd soft ntnd no mass or ascites +bs GU normal male MS no joint effusions or deformity Ext 1+ bilat pretib edema, erythema of lower R leg Neuro is deconditioned and lethargic, confused    RIJ TDC / fem temp cath      Home meds include - phoslo 1 ac tid, duneb, metoprolol 12.5 bid, midodrine 10 mg tid, omeprazole, tamsulosin, prns        OP HD: [facility] - started HD 11/2020  3h  400/1.5   50.2kg   2/2 bath  RIJ TDC/ AVF   Heparin 4000 - consistently coming off 3- 4kg over for the last 2 wks - last OP HD 8/15, post wt 53.6kg - rocaltrol 0.5 mcg po tiw     Assessment/ Plan: Acute hypoxic resp failure - w/ volume overload / edema on exam, and pulm edema by CXR. Pt was on 5 L [place] on admission. We did HD overnight w/ 2 L off overnight last night.  AMS - this is not any better today ESRD - on HD TTS. Had HD last night Deconditioning - severely deconditioned. Lives in facility.  RLE cellulitis - per pmd, started on IV abx EOL - palliative team met w/ family and plan is for comfort care, no further dialysis.  Will sign off.     Vinson Moselle MD  [facility] 12/13/2022, 10:58 AM  Recent Labs  Lab 12/11/22 2111 12/11/22 2331  HGB 14.1  --   ALBUMIN 2.5*  --   CALCIUM 9.6  --   PHOS  --  4.8*  CREATININE 4.83*  --   K 4.3  --    No results for input(s):  "IRON", "TIBC", "FERRITIN" in the last 168 hours. Inpatient medications:  Chlorhexidine Gluconate Cloth  6 each Topical Q0600   Chlorhexidine Gluconate Cloth  6 each Topical Q0600   heparin sodium (porcine)       LORazepam  0.5 mg Intravenous Q6H    sodium chloride 20 mL/hr at 12/13/22 1051   fentaNYL infusion INTRAVENOUS     acetaminophen **OR** acetaminophen, acetaminophen **OR** acetaminophen, dextrose, fentaNYL, fentaNYL (SUBLIMAZE) injection, glycopyrrolate **OR** glycopyrrolate **OR** glycopyrrolate, haloperidol lactate, heparin sodium (porcine), levalbuterol, LORazepam, ondansetron **OR** ondansetron (ZOFRAN) IV, polyvinyl alcohol

## 2022-12-13 NOTE — Consult Note (Signed)
Palliative Medicine Inpatient Consult Note  Consulting Provider:  Tereasa Coop, MD   Reason for consult:   Palliative Care Consult Services Palliative Medicine Consult  Reason for Consult? Need to discuss follow-up With patient's family.   12/13/2022  HPI:  Per intake H&P --> 89 year old male history of end-stage renal disease on hemodialysis Monday Wednesday Friday via right-sided PermCath, hypertension, anemia chronic kidney disease, chronic systolic heart failure EF of 30%, chronic hypotension on midodrine, pulmonary fibrosis who presents to the ER today with shortness of breath and hypoxia.   Palliative care asked to get involved for further GOC conversations.   Clinical Assessment/Goals of Care:  *Please note that this is a verbal dictation therefore any spelling or grammatical errors are due to the "Dragon Medical One" system interpretation.  I have reviewed medical records including EPIC notes, labs and imaging, received report from bedside RN, assessed the patient who is breathing rapidly and deeply appearing quite distressed.    I met with patients wife, daughter, SIL and two grand sons to further discuss diagnosis prognosis, GOC, EOL wishes, disposition and options.   I introduced Palliative Medicine as specialized medical care for people living with serious illness. It focuses on providing relief from the symptoms and stress of a serious illness. The goal is to improve quality of life for both the patient and the family.  Medical History Review and Understanding:  A review of Dyson's PMH was held inclusive of his ESRD, HTN, anemia, heart failure with EF of 30%, and pulmonary fibrosis.   Social History:  Lives in [place], [place]. Has been married to his wife for 59 years. Has two children. Is a former Comptroller. Is a man of faith - Christian.   Functional and Nutritional State:  Was function at home living with his spouse. Nutritional state poor.    Advance Directives:  A detailed discussion was had today regarding advanced directives.  Patients spouse, [name] is his Runner, broadcasting/film/video.   Code Status:  Concepts specific to code status, artifical feeding and hydration, continued IV antibiotics and rehospitalization was had.  The difference between a aggressive medical intervention path  and a palliative comfort care path for this patient at this time was had.   Keala is an established DNAR/DNI.  Discussion:  Discussed Georgie's rapid decline in respiratory state and underlying incurable co-morbid conditions. Patients family are all in agreement with comfort emphasis of care as they had discussed this with Dr. Sharl Ma.   We talked about transition to comfort measures in house and what that would entail inclusive of medications to control pain, dyspnea, agitation, nausea, itching, and hiccups.    We discussed stopping all uneccessary measures such as cardiac monitoring, blood draws, needle sticks, and frequent vital signs.   Utilized reflective listening throughout our time together.   We discussed the possible need for a fentanyl gtt given the degree of distress which had been endured by [name]. Patients wife is agreeable to the use of a fent gtt moving forward to keep Isamar cal and comfortable.   Discussed the importance of continued conversation with family and their  medical providers regarding overall plan of care and treatment options, ensuring decisions are within the context of the patients values and GOCs.  Decision Maker: Gish,[name] (Spouse): [phone] (Mobile)   SUMMARY OF RECOMMENDATIONS   DNAR/DNI  Initiate a Fentanyl gtt for dyspnea  Low dose ativan ATC for restlessness  Additional comfort medications per MAR  Wean O2 as able (await fentanyl gtt  initiation)  Unrestricted visitation  Ongoing PMT support  Anticipate in hospital death  Code Status/Advance Care Planning: DNAR/DNI  Palliative  Prophylaxis:  Aspiration, Bowel Regimen, Delirium Protocol, Frequent Pain Assessment, Oral Care, Palliative Wound Care, and Turn Reposition  Additional Recommendations (Limitations, Scope, Preferences): Continue comfort measures  Psycho-social/Spiritual:  Desire for further Chaplaincy support: Yes Additional Recommendations: Education on end of life process   Prognosis: Limited hours to days  Discharge Planning: Discharge to home once medically optimized.  Vitals:   12/13/22 0545 12/13/22 0825  BP:  102/62  Pulse: 81 76  Resp: (!) 28 19  Temp:  98 F (36.7 C)  SpO2: 97% 92%    Intake/Output Summary (Last 24 hours) at 12/13/2022 0855 Last data filed at 12/13/2022 0422 Gross per 24 hour  Intake --  Output 2000 ml  Net -2000 ml   Last Weight  Most recent update: 12/12/2022  6:37 AM    Weight  52.8 kg (116 lb 6.5 oz)            Gen:  Frail elderly Caucasian M in distress HEENT: Dry mucous membranes CV: Regular rate and rhythm  PULM: On 6LPM [place],  Tachypnea, labored breathing ABD: soft/nontender  EXT: Cool distal digits with modeling Neuro: Opens eyes not verbal  PPS: 20%   This conversation/these recommendations were discussed with patient primary care team, Dr. Sharl Ma  Billing based on MDM: High ______________________________________________________ Lamarr Lulas [place] Palliative Medicine Team Team Cell Phone: [phone] Please utilize secure chat with additional questions, if there is no response within 30 minutes please call the above phone number  Palliative Medicine Team providers are available by phone from 7am to 7pm daily and can be reached through the team cell phone.  Should this patient require assistance outside of these hours, please call the patient's attending physician.

## 2022-12-13 NOTE — TOC CM/SW Note (Signed)
Transition of Care [facility]) - Inpatient Brief Assessment   Patient Details  Name: Jeffrey Payne MRN: [id number] Date of Birth: 1932-05-09  [facility] CM/SW Contact:    Tom-Johnson, Hershal Coria, RN Phone Number: 12/13/2022, 2:06 PM   Clinical Narrative:  Palliative Care met with patient's family and plan is to transition patient's care to Comfort Care.   CM will continue to follow and render compassionate support at this time.        Transition of Care Asessment:

## 2022-12-13 NOTE — Progress Notes (Signed)
Pt's chart reviewed and update provided to pt's out-pt HD clinic that pt is being transitioned to comfort care.   Olivia Canter Renal Navigator [phone]

## 2022-12-13 NOTE — Significant Event (Signed)
Rapid Response Event Note   Reason for Call :  Increased FiO2 demand, ABD breathing  Initial Focused Assessment:  Py lying in bed with eyes open, anxious. He keeps saying "home, home, home." His is tachypneic and using his ABD muscles to breathe. He will follow commands and answer some simple questions.Lungs diminished t/o. Skin cool to touch  HR-78, BP-98/62, RR-30, SpO2-93% on 5L [place].   Pt says he is dying and would like everyone to leave his room. He is refusing to have blood drawn  Interventions:  ABG-pt refused D50-CBG 64>pt refused recheck. CBC/CMP-pt refusing Haldol 1mg  IV  Plan of Care:  Pt is DNR/DNI. He is currently refusing all interventions. He did allow RN to given him some haldol through his IV. Dtr Wynona Canes called and updated on situation. She is on her way to the hospital. Continue to monitor pt. Call RRT if further assistance needed.   Event Summary:   MD Notified: Dr. Janalyn Shy notified and came to bedside Call Time:0543 Arrival Time:0550 End [id number]  Terrilyn Saver, RN

## 2022-12-13 NOTE — Progress Notes (Addendum)
Rapid response has been called as patient has some difficulty breathing and oxygen requirement was going out baseline from 5 L to 7 L.  At bedside patient saturating 92 to 93% on 7L.  Patient is very anxious.  Per chart review patient has been admitted for volume overload in the setting of missing dialysis session.  He has history of ESRD on HD MWF schedule, chronic CHF with reduced EF 30% and chronic hypotension on midodrine and pulmonary fibrosis initially admitted on 12/11/2022 for acute hypoxic respiratory failure.  Initially he was on nonrebreather which has been transitioned to nasal cannula oxygen 5 L. Patient dialysis today after HD catheter has been placed on 12/12/2022.  At bedside evaluation patient is alert but he is confused.  He wants to go home and requesting to speak with his wife and daughter multiple times.  He is refusing all labs.  Not able to get get ABG, CBC and CMP from him. Gave him Haldol 1 mg once to calm him down.  Rapid response nurse Marcellina Millin able to get and hold on patient's daughter who is coming from [place].   -For now continue maintenance oxygen 6 to 7 L to keep O2 sat above 92%.  Ordered Xopenex nebulizer as needed for wheezing or shortness of breath.  If patient desatted again we will need to do ABG and need to place him on either high flow or nonrebreather or BiPAP however I doubt that he will be able to tolerate and cooperate with [place] about this treatment.  To my judgment patient need palliative care consult and family meeting to discuss the goal of care.   Tereasa Coop, MD [facility] 12/13/2022, 6:29 AM

## 2022-12-14 DIAGNOSIS — Z7189 Other specified counseling: Secondary | ICD-10-CM | POA: Diagnosis not present

## 2022-12-14 DIAGNOSIS — J9601 Acute respiratory failure with hypoxia: Secondary | ICD-10-CM | POA: Diagnosis not present

## 2022-12-14 DIAGNOSIS — Z515 Encounter for palliative care: Secondary | ICD-10-CM | POA: Diagnosis not present

## 2022-12-14 DIAGNOSIS — N186 End stage renal disease: Secondary | ICD-10-CM | POA: Diagnosis not present

## 2022-12-14 DIAGNOSIS — E8770 Fluid overload, unspecified: Secondary | ICD-10-CM | POA: Diagnosis not present

## 2022-12-14 DIAGNOSIS — I5042 Chronic combined systolic (congestive) and diastolic (congestive) heart failure: Secondary | ICD-10-CM | POA: Diagnosis not present

## 2022-12-14 LAB — HEPATITIS B SURFACE ANTIBODY, QUANTITATIVE: Hep B S AB Quant (Post): <3.5 m[IU]/mL — ABNORMAL LOW

## 2022-12-26 NOTE — Progress Notes (Signed)
    1412  Spiritual Encounters  Care provided to: Family  Conversation partners present during encounter Nurse  Referral source Code page  Reason for visit End-of-life   Responded to a call to provide care to family after patient transitioned. Family had a change of plans and are looking into a crematory. Prayer, reflective listening and prayer was provided. Present was spouse, 2 daughters, 2 sons-in-law and 2 grandsons.

## 2022-12-26 NOTE — Progress Notes (Signed)
This chaplain is present for F/U EOL spiritual care. The Pt. wife and multiple generations of family are at the bedside.   The sacred space is filled with love and family telling stories among themselves. The family is hopeful the Pt. granddaughter will arrive from [place] before the Pt. passing.   The chaplain is available for F/U spiritual care as needed.  Chaplain Stephanie Acre [phone]

## 2022-12-26 NOTE — Discharge Summary (Signed)
Death Summary  Jeffrey Payne ZOX:[id number] DOB: 22-Aug-1932 DOA: 2022/12/29  PCP: Orpha Bur, MD   Admit date: 12/29/2022 Date of Death: 01-Jan-2023  Final Diagnoses:  Principal Problem:   Volume overload Active Problems:   Acute respiratory failure with hypoxia (HCC)   End-stage renal disease on hemodialysis (HCC)   Essential hypertension   History of anemia due to chronic kidney disease   Chronic combined systolic and diastolic heart failure (HCC)   Hypotension   DNR (do not resuscitate)/DNI(Do Not Intubate)   Acute hypoxemic respiratory failure (HCC)      History of present illness:  89 year old male history of end-stage renal disease on hemodialysis Monday Wednesday Friday via right-sided PermCath, hypertension, anemia chronic kidney disease, chronic systolic heart failure EF of 30%, chronic hypotension on midodrine, pulmonary fibrosis who presented to the ER  with shortness of breath and hypoxia.   patient said that he felt bad and did not go to hemodialysis. He called EMS, found to be hypoxic.  Was transferred to [facility]. Chest x-ray showed diffuse pulmonary edema, right-sided dialysis catheter Nephrology was consulted  Hospital Course:   Patient admitted with acute hypoxic respiratory failure, encephalopathy after he missed hemodialysis.  Underwent hemodialysis however patient continued to decline with worsening delirium.  Nephrology was consulted.  Later goals of care discussion was done with family along with palliative care.  Family decided for full comfort measures.  Patient started on IV fentanyl gtt.  Patient was comfortable, today expired at 1310.  Time of death 1310      Time: 1310  Signed:  Meredeth Ide  [facility] 2023/01/01, 1:17 PM

## 2022-12-26 NOTE — Progress Notes (Signed)
   Palliative Medicine Inpatient Follow Up Note  HPI: 89 year old male history of end-stage renal disease on hemodialysis Monday Wednesday Friday via right-sided PermCath, hypertension, anemia chronic kidney disease, chronic systolic heart failure EF of 30%, chronic hypotension on midodrine, pulmonary fibrosis who presents to the ER today with shortness of breath and hypoxia.    Palliative care asked to get involved for further GOC conversations.   Today's Discussion   *Please note that this is a verbal dictation therefore any spelling or grammatical errors are due to the "[employer]" system interpretation.  Chart reviewed inclusive of vital signs, progress notes, laboratory results, and diagnostic images.   I met with Jeffrey Payne at bedside this morning he was resting comfortably on fentanyl 57mcg/hr. He remains to have cool distal digits.   As of presently plan for patients family to visit throughout the day. Patients daughter is coming from [place] therefore the oxygen wean has been held off on to allow her the opportunity to see him per family request.   Questions and concerns addressed/Palliative Support Provided.   Objective Assessment: Vital Signs Vitals:   12/13/22 0825 12/13/22 2319  BP: 102/62 (!) 90/43  Pulse: 76 88  Resp: 19 (!) 24  Temp: 98 F (36.7 C) 98.4 F (36.9 C)  SpO2: 92% 100%    Intake/Output Summary (Last 24 hours) at  0800 Last data filed at 12/13/2022 1821 Gross per 24 hour  Intake 183.12 ml  Output --  Net 183.12 ml   Last Weight  Most recent update: 12/12/2022  6:37 AM    Weight  52.8 kg (116 lb 6.5 oz)            Gen:  Frail elderly Caucasian M in distress HEENT: Dry mucous membranes CV: Regular rate and rhythm  PULM: On 4LPM [place],  breathing is even and nonlabored ABD: soft/nontender  EXT: Cool distal digits with modeling Neuro: Opens eyes not verbal  SUMMARY OF RECOMMENDATIONS   DNAR/DNI   Continue Fentanyl gtt  for dyspnea   Low dose ativan ATC for restlessness   Additional comfort medications per MAR   Wean O2 as able --> Held off per family request as family to visit today, daughter arriving from [place]   Unrestricted visitation   Ongoing PMT support   Anticipate in hospital death  Billing based on MDM: High - parenteral controlled substance review and modification  ______________________________________________________________________________________ Jeffrey Payne Palliative Medicine Team Team Cell Phone: [phone] Please utilize secure chat with additional questions, if there is no response within 30 minutes please call the above phone number  Palliative Medicine Team providers are available by phone from 7am to 7pm daily and can be reached through the team cell phone.  Should this patient require assistance outside of these hours, please call the patient's attending physician.

## 2022-12-26 NOTE — Progress Notes (Signed)
2 RNs pronounce dead at 1:10 pm.

## 2022-12-26 NOTE — Progress Notes (Addendum)
Called to pt's room by NT.  Pt pronounced deceased at 1310 by 2 RN's.  Covering MD [place] made aware.  Numerous family members at the bedside.  Call placed to [employer].  Spoke with Erasmo Score.  Pt is not a potential donor.  Reference # [id number].    1429:Family now unsure of which funeral home they will be using  Remaining fentanyl premix bag wasted by 2 RN's C. Francie Massing, RN/Jay Naramdas, RN  Family remains at the bedside.

## 2022-12-26 DEATH — deceased
